# Patient Record
Sex: Female | Born: 1935 | Race: White | Hispanic: No | Marital: Married | Smoking: Never smoker
Health system: Southern US, Community
[De-identification: ages and names within clinical notes are randomized; demographics above are authoritative.]

## PROBLEM LIST (undated history)

## (undated) DIAGNOSIS — I10 Essential (primary) hypertension: Secondary | ICD-10-CM

## (undated) DIAGNOSIS — G8929 Other chronic pain: Secondary | ICD-10-CM

## (undated) DIAGNOSIS — I509 Heart failure, unspecified: Secondary | ICD-10-CM

## (undated) DIAGNOSIS — Z950 Presence of cardiac pacemaker: Secondary | ICD-10-CM

## (undated) DIAGNOSIS — J449 Chronic obstructive pulmonary disease, unspecified: Secondary | ICD-10-CM

## (undated) DIAGNOSIS — R0989 Other specified symptoms and signs involving the circulatory and respiratory systems: Secondary | ICD-10-CM

## (undated) DIAGNOSIS — E119 Type 2 diabetes mellitus without complications: Secondary | ICD-10-CM

## (undated) DIAGNOSIS — M549 Dorsalgia, unspecified: Secondary | ICD-10-CM

## (undated) DIAGNOSIS — C801 Malignant (primary) neoplasm, unspecified: Secondary | ICD-10-CM

## (undated) HISTORY — PX: BACK SURGERY: SHX140

## (undated) HISTORY — PX: LEG SURGERY: SHX1003

## (undated) HISTORY — PX: CORONARY STENT PLACEMENT: SHX1402

## (undated) HISTORY — PX: PACEMAKER PLACEMENT: SHX43

## (undated) HISTORY — PX: FOOT SURGERY: SHX648

## (undated) HISTORY — PX: ABDOMINAL HYSTERECTOMY: SHX81

## (undated) HISTORY — PX: TONSILLECTOMY: SUR1361

## (undated) HISTORY — DX: Other specified symptoms and signs involving the circulatory and respiratory systems: R09.89

---

## 2006-06-09 ENCOUNTER — Ambulatory Visit: Payer: Self-pay | Admitting: Podiatry

## 2006-06-13 ENCOUNTER — Ambulatory Visit: Payer: Self-pay | Admitting: Podiatry

## 2007-07-31 ENCOUNTER — Emergency Department: Payer: Self-pay | Admitting: Emergency Medicine

## 2007-07-31 ENCOUNTER — Other Ambulatory Visit: Payer: Self-pay

## 2008-05-27 ENCOUNTER — Ambulatory Visit: Payer: Self-pay | Admitting: Podiatry

## 2008-09-05 ENCOUNTER — Emergency Department: Payer: Self-pay | Admitting: Emergency Medicine

## 2009-02-22 ENCOUNTER — Emergency Department: Payer: Self-pay | Admitting: Emergency Medicine

## 2009-11-19 ENCOUNTER — Emergency Department: Payer: Self-pay | Admitting: Emergency Medicine

## 2010-02-22 ENCOUNTER — Ambulatory Visit: Payer: Self-pay | Admitting: Family Medicine

## 2010-04-12 ENCOUNTER — Encounter: Payer: Self-pay | Admitting: Internal Medicine

## 2010-04-15 ENCOUNTER — Emergency Department: Payer: Self-pay | Admitting: Emergency Medicine

## 2010-04-25 ENCOUNTER — Observation Stay: Payer: Self-pay | Admitting: Internal Medicine

## 2010-04-27 ENCOUNTER — Encounter: Payer: Self-pay | Admitting: Internal Medicine

## 2010-08-02 ENCOUNTER — Ambulatory Visit: Payer: Self-pay | Admitting: Podiatry

## 2010-11-16 ENCOUNTER — Ambulatory Visit: Payer: Self-pay | Admitting: Internal Medicine

## 2010-12-13 ENCOUNTER — Ambulatory Visit: Payer: Self-pay | Admitting: Internal Medicine

## 2011-06-05 ENCOUNTER — Inpatient Hospital Stay: Payer: Self-pay | Admitting: Podiatry

## 2011-06-05 ENCOUNTER — Other Ambulatory Visit: Payer: Self-pay | Admitting: Podiatry

## 2011-06-10 LAB — PATHOLOGY REPORT

## 2011-06-24 ENCOUNTER — Ambulatory Visit: Payer: Self-pay | Admitting: Vascular Surgery

## 2011-06-27 ENCOUNTER — Other Ambulatory Visit: Payer: Self-pay | Admitting: Podiatry

## 2011-07-01 LAB — MISC AER/ANAEROBIC CULT.

## 2011-07-11 ENCOUNTER — Inpatient Hospital Stay: Payer: Self-pay | Admitting: Podiatry

## 2011-07-11 LAB — BASIC METABOLIC PANEL
BUN: 27 mg/dL — ABNORMAL HIGH (ref 7–18)
Calcium, Total: 9.1 mg/dL (ref 8.5–10.1)
Creatinine: 0.91 mg/dL (ref 0.60–1.30)
EGFR (African American): >60
EGFR (Non-African Amer.): >60
Glucose: 204 mg/dL — ABNORMAL HIGH (ref 65–99)
Potassium: 4 mmol/L (ref 3.5–5.1)
Sodium: 143 mmol/L (ref 136–145)

## 2011-07-11 LAB — CBC WITH DIFFERENTIAL/PLATELET
Basophil #: 0.1 10*3/uL (ref 0.0–0.1)
Eosinophil %: 2.4 %
HCT: 35.1 % (ref 35.0–47.0)
HGB: 11.5 g/dL — ABNORMAL LOW (ref 12.0–16.0)
Lymphocyte #: 1.2 10*3/uL (ref 1.0–3.6)
MCH: 29.1 pg (ref 26.0–34.0)
MCV: 89 fL (ref 80–100)
Monocyte #: 0.7 10*3/uL (ref 0.0–0.7)
Platelet: 174 10*3/uL (ref 150–440)
RBC: 3.96 10*6/uL (ref 3.80–5.20)
RDW: 16.5 % — ABNORMAL HIGH (ref 11.5–14.5)

## 2011-07-13 LAB — BASIC METABOLIC PANEL
Anion Gap: 11 (ref 7–16)
Chloride: 102 mmol/L (ref 98–107)
Co2: 31 mmol/L (ref 21–32)
Creatinine: 1.06 mg/dL (ref 0.60–1.30)
EGFR (Non-African Amer.): 52 — ABNORMAL LOW
Glucose: 143 mg/dL — ABNORMAL HIGH (ref 65–99)
Osmolality: 294 (ref 275–301)
Sodium: 144 mmol/L (ref 136–145)

## 2011-07-13 LAB — CBC WITH DIFFERENTIAL/PLATELET
Basophil %: 0.7 %
Eosinophil %: 8.5 %
HGB: 11.6 g/dL — ABNORMAL LOW (ref 12.0–16.0)
MCH: 29.1 pg (ref 26.0–34.0)
Monocyte #: 0.7 10*3/uL (ref 0.0–0.7)
Monocyte %: 10.6 %
Neutrophil %: 65.6 %
Platelet: 165 10*3/uL (ref 150–440)
RBC: 3.97 10*6/uL (ref 3.80–5.20)
WBC: 6.7 10*3/uL (ref 3.6–11.0)

## 2011-07-15 LAB — CBC WITH DIFFERENTIAL/PLATELET
Basophil %: 0.9 %
Eosinophil %: 4.5 %
HCT: 28.5 % — ABNORMAL LOW (ref 35.0–47.0)
HGB: 9.4 g/dL — ABNORMAL LOW (ref 12.0–16.0)
Lymphocyte #: 1.4 10*3/uL (ref 1.0–3.6)
MCH: 29.1 pg (ref 26.0–34.0)
MCV: 88 fL (ref 80–100)
Monocyte #: 1 10*3/uL — ABNORMAL HIGH (ref 0.0–0.7)
Platelet: 137 10*3/uL — ABNORMAL LOW (ref 150–440)
RBC: 3.23 10*6/uL — ABNORMAL LOW (ref 3.80–5.20)
WBC: 8.1 10*3/uL (ref 3.6–11.0)

## 2011-07-15 LAB — CREATININE, SERUM
Creatinine: 1.08 mg/dL (ref 0.60–1.30)
EGFR (African American): >60

## 2011-07-16 LAB — BASIC METABOLIC PANEL
Anion Gap: 10 (ref 7–16)
BUN: 23 mg/dL — ABNORMAL HIGH (ref 7–18)
Calcium, Total: 8.2 mg/dL — ABNORMAL LOW (ref 8.5–10.1)
Chloride: 101 mmol/L (ref 98–107)
Osmolality: 287 (ref 275–301)
Potassium: 3.8 mmol/L (ref 3.5–5.1)

## 2011-07-16 LAB — CBC WITH DIFFERENTIAL/PLATELET
Basophil #: 0.1 10*3/uL (ref 0.0–0.1)
Basophil %: 1 %
Eosinophil #: 0.4 10*3/uL (ref 0.0–0.7)
HCT: 26.5 % — ABNORMAL LOW (ref 35.0–47.0)
HGB: 8.7 g/dL — ABNORMAL LOW (ref 12.0–16.0)
Lymphocyte #: 1.6 10*3/uL (ref 1.0–3.6)
Lymphocyte %: 22.5 %
MCH: 29 pg (ref 26.0–34.0)
MCHC: 32.9 g/dL (ref 32.0–36.0)
Monocyte #: 0.9 10*3/uL — ABNORMAL HIGH (ref 0.0–0.7)
Monocyte %: 12.4 %
Neutrophil #: 4.1 10*3/uL (ref 1.4–6.5)
RDW: 16.5 % — ABNORMAL HIGH (ref 11.5–14.5)
WBC: 7 10*3/uL (ref 3.6–11.0)

## 2011-08-07 ENCOUNTER — Ambulatory Visit: Payer: Self-pay | Admitting: Specialist

## 2011-08-07 LAB — CBC WITH DIFFERENTIAL/PLATELET
Basophil %: 1.3 %
Eosinophil %: 10.9 %
HCT: 31.3 % — ABNORMAL LOW (ref 35.0–47.0)
Lymphocyte #: 1 10*3/uL (ref 1.0–3.6)
MCH: 28.9 pg (ref 26.0–34.0)
MCV: 89 fL (ref 80–100)
Monocyte #: 0.6 10*3/uL (ref 0.0–0.7)
Monocyte %: 9.2 %
Neutrophil #: 3.8 10*3/uL (ref 1.4–6.5)
Platelet: 165 10*3/uL (ref 150–440)
RDW: 16.7 % — ABNORMAL HIGH (ref 11.5–14.5)
WBC: 6.2 10*3/uL (ref 3.6–11.0)

## 2011-08-07 LAB — BASIC METABOLIC PANEL
Anion Gap: 6 — ABNORMAL LOW (ref 7–16)
BUN: 24 mg/dL — ABNORMAL HIGH (ref 7–18)
Creatinine: 0.97 mg/dL (ref 0.60–1.30)
EGFR (Non-African Amer.): 58 — ABNORMAL LOW
Glucose: 223 mg/dL — ABNORMAL HIGH (ref 65–99)
Osmolality: 296 (ref 275–301)
Potassium: 4.2 mmol/L (ref 3.5–5.1)

## 2011-08-14 ENCOUNTER — Ambulatory Visit: Payer: Self-pay | Admitting: Specialist

## 2011-08-14 LAB — BASIC METABOLIC PANEL
Anion Gap: 7 (ref 7–16)
BUN: 20 mg/dL — ABNORMAL HIGH (ref 7–18)
Calcium, Total: 9.1 mg/dL (ref 8.5–10.1)
Co2: 32 mmol/L (ref 21–32)
EGFR (African American): >60
EGFR (Non-African Amer.): >60
Glucose: 134 mg/dL — ABNORMAL HIGH (ref 65–99)
Osmolality: 290 (ref 275–301)
Potassium: 4 mmol/L (ref 3.5–5.1)

## 2011-08-14 LAB — CBC WITH DIFFERENTIAL/PLATELET
Basophil #: 0.1 10*3/uL (ref 0.0–0.1)
Basophil %: 0.9 %
Eosinophil #: 0.6 10*3/uL (ref 0.0–0.7)
Eosinophil %: 9.1 %
HCT: 32.9 % — ABNORMAL LOW (ref 35.0–47.0)
Lymphocyte #: 1.3 10*3/uL (ref 1.0–3.6)
Lymphocyte %: 20.5 %
MCHC: 32.3 g/dL (ref 32.0–36.0)
MCV: 89 fL (ref 80–100)
Monocyte #: 0.6 10*3/uL (ref 0.0–0.7)
Neutrophil #: 3.8 10*3/uL (ref 1.4–6.5)
Neutrophil %: 60 %
RDW: 16.7 % — ABNORMAL HIGH (ref 11.5–14.5)
WBC: 6.4 10*3/uL (ref 3.6–11.0)

## 2011-08-14 LAB — SEDIMENTATION RATE: Erythrocyte Sed Rate: 33 mm/hr — ABNORMAL HIGH (ref 0–30)

## 2012-03-03 ENCOUNTER — Inpatient Hospital Stay: Payer: Self-pay | Admitting: Internal Medicine

## 2012-03-03 LAB — BASIC METABOLIC PANEL
BUN: 21 mg/dL — ABNORMAL HIGH (ref 7–18)
EGFR (African American): >60
EGFR (Non-African Amer.): >60
Glucose: 124 mg/dL — ABNORMAL HIGH (ref 65–99)
Osmolality: 286 (ref 275–301)
Potassium: 4.7 mmol/L (ref 3.5–5.1)
Sodium: 141 mmol/L (ref 136–145)

## 2012-03-03 LAB — HEPATIC FUNCTION PANEL A (ARMC)
Albumin: 3.7 g/dL (ref 3.4–5.0)
Bilirubin, Direct: 0.3 mg/dL — ABNORMAL HIGH (ref 0.00–0.20)
SGOT(AST): 38 U/L — ABNORMAL HIGH (ref 15–37)
Total Protein: 6.9 g/dL (ref 6.4–8.2)

## 2012-03-03 LAB — TROPONIN I: Troponin-I: <0.02 ng/mL

## 2012-03-03 LAB — CBC: HCT: 36.4 % (ref 35.0–47.0)

## 2012-03-03 LAB — PRO B NATRIURETIC PEPTIDE: B-Type Natriuretic Peptide: 7105 pg/mL — ABNORMAL HIGH (ref 0–450)

## 2012-03-03 LAB — CK TOTAL AND CKMB (NOT AT ARMC)
CK, Total: 73 U/L (ref 21–215)
CK, Total: 58 U/L (ref 21–215)
CK-MB: 1.6 ng/mL (ref 0.5–3.6)

## 2012-03-04 LAB — CBC WITH DIFFERENTIAL/PLATELET
Eosinophil #: 0.2 10*3/uL (ref 0.0–0.7)
Eosinophil %: 3.1 %
MCH: 28.5 pg (ref 26.0–34.0)
MCHC: 32.4 g/dL (ref 32.0–36.0)
Monocyte #: 0.7 x10 3/mm (ref 0.2–0.9)
Neutrophil #: 5 10*3/uL (ref 1.4–6.5)
Neutrophil %: 71.7 %
Platelet: 130 10*3/uL — ABNORMAL LOW (ref 150–440)
WBC: 7 10*3/uL (ref 3.6–11.0)

## 2012-03-04 LAB — BASIC METABOLIC PANEL
BUN: 22 mg/dL — ABNORMAL HIGH (ref 7–18)
Chloride: 106 mmol/L (ref 98–107)
Co2: 29 mmol/L (ref 21–32)
Creatinine: 0.92 mg/dL (ref 0.60–1.30)
EGFR (African American): >60
EGFR (Non-African Amer.): 57 — ABNORMAL LOW
Glucose: 110 mg/dL — ABNORMAL HIGH (ref 65–99)
Potassium: 3.9 mmol/L (ref 3.5–5.1)

## 2012-03-04 LAB — TROPONIN I: Troponin-I: 0.02 ng/mL

## 2012-03-04 LAB — LIPID PANEL
Cholesterol: 101 mg/dL (ref 0–200)
HDL Cholesterol: 23 mg/dL — ABNORMAL LOW (ref 40–60)
Ldl Cholesterol, Calc: 60 mg/dL (ref 0–100)
Triglycerides: 90 mg/dL (ref 0–200)

## 2012-03-04 LAB — MAGNESIUM: Magnesium: 2.3 mg/dL

## 2012-03-04 LAB — HEMOGLOBIN A1C: Hemoglobin A1C: 7.7 % — ABNORMAL HIGH (ref 4.2–6.3)

## 2012-03-04 LAB — CK TOTAL AND CKMB (NOT AT ARMC): CK, Total: 39 U/L (ref 21–215)

## 2012-03-05 LAB — CBC WITH DIFFERENTIAL/PLATELET
Basophil #: 0.1 x10 3/mm 3
Basophil %: 1 %
Eosinophil #: 0.2 x10 3/mm 3
Eosinophil %: 3.1 %
HCT: 35 %
HGB: 11.4 g/dL — ABNORMAL LOW
Lymphocyte %: 14.3 %
Lymphs Abs: 1 x10 3/mm 3
MCH: 28.7 pg
MCHC: 32.6 g/dL
MCV: 88 fL
Monocyte #: 0.7 "x10 3/mm "
Monocyte %: 10.3 %
Neutrophil #: 5 x10 3/mm 3
Neutrophil %: 71.3 %
Platelet: 146 x10 3/mm 3 — ABNORMAL LOW
RBC: 3.98 X10 6/mm 3
RDW: 17.2 % — ABNORMAL HIGH
WBC: 7 x10 3/mm 3

## 2012-03-05 LAB — BASIC METABOLIC PANEL
BUN: 21 mg/dL — ABNORMAL HIGH (ref 7–18)
Calcium, Total: 8.9 mg/dL (ref 8.5–10.1)
Chloride: 104 mmol/L (ref 98–107)
EGFR (African American): >60
Glucose: 130 mg/dL — ABNORMAL HIGH (ref 65–99)
Osmolality: 288 (ref 275–301)
Potassium: 3.8 mmol/L (ref 3.5–5.1)
Sodium: 142 mmol/L (ref 136–145)

## 2012-03-09 LAB — CULTURE, BLOOD (SINGLE)

## 2013-01-14 ENCOUNTER — Ambulatory Visit: Payer: Self-pay | Admitting: Internal Medicine

## 2013-04-19 ENCOUNTER — Emergency Department: Payer: Self-pay | Admitting: Emergency Medicine

## 2013-04-26 ENCOUNTER — Emergency Department: Payer: Self-pay | Admitting: Emergency Medicine

## 2013-04-26 LAB — BASIC METABOLIC PANEL
BUN: 20 mg/dL — ABNORMAL HIGH (ref 7–18)
Calcium, Total: 8.8 mg/dL (ref 8.5–10.1)
Co2: 32 mmol/L (ref 21–32)
Creatinine: 0.87 mg/dL (ref 0.60–1.30)
EGFR (Non-African Amer.): >60
Osmolality: 282 (ref 275–301)
Potassium: 4.4 mmol/L (ref 3.5–5.1)

## 2013-04-26 LAB — CBC
HCT: 33.3 % — ABNORMAL LOW (ref 35.0–47.0)
MCH: 29.5 pg (ref 26.0–34.0)
Platelet: 126 10*3/uL — ABNORMAL LOW (ref 150–440)
RBC: 3.74 10*6/uL — ABNORMAL LOW (ref 3.80–5.20)
RDW: 17.9 % — ABNORMAL HIGH (ref 11.5–14.5)
WBC: 5.6 10*3/uL (ref 3.6–11.0)

## 2013-08-19 ENCOUNTER — Observation Stay: Payer: Self-pay | Admitting: Internal Medicine

## 2013-08-19 LAB — CBC
HCT: 36.7 % (ref 35.0–47.0)
HGB: 12.2 g/dL (ref 12.0–16.0)
MCH: 30.6 pg (ref 26.0–34.0)
MCHC: 33.2 g/dL (ref 32.0–36.0)
MCV: 92 fL (ref 80–100)
Platelet: 190 10*3/uL (ref 150–440)
RBC: 3.98 10*6/uL (ref 3.80–5.20)
RDW: 15.3 % — ABNORMAL HIGH (ref 11.5–14.5)
WBC: 7.5 10*3/uL (ref 3.6–11.0)

## 2013-08-19 LAB — COMPREHENSIVE METABOLIC PANEL
ALK PHOS: 103 U/L
Albumin: 3.6 g/dL (ref 3.4–5.0)
Anion Gap: 5 — ABNORMAL LOW (ref 7–16)
BUN: 48 mg/dL — ABNORMAL HIGH (ref 7–18)
Bilirubin,Total: 0.6 mg/dL (ref 0.2–1.0)
CO2: 25 mmol/L (ref 21–32)
Calcium, Total: 8.4 mg/dL — ABNORMAL LOW (ref 8.5–10.1)
Chloride: 106 mmol/L (ref 98–107)
Creatinine: 1.07 mg/dL (ref 0.60–1.30)
EGFR (African American): 54 — ABNORMAL LOW
GFR CALC NON AF AMER: 47 — AB
GLUCOSE: 98 mg/dL (ref 65–99)
Osmolality: 285 (ref 275–301)
Potassium: 3.7 mmol/L (ref 3.5–5.1)
SGOT(AST): 22 U/L (ref 15–37)
SGPT (ALT): 18 U/L (ref 12–78)
Sodium: 136 mmol/L (ref 136–145)
TOTAL PROTEIN: 7.4 g/dL (ref 6.4–8.2)

## 2013-08-19 LAB — URINALYSIS, COMPLETE
BILIRUBIN, UR: NEGATIVE
BLOOD: NEGATIVE
Bacteria: NONE SEEN
Glucose,UR: NEGATIVE mg/dL (ref 0–75)
Hyaline Cast: 4
Ketone: NEGATIVE
Nitrite: NEGATIVE
PH: 5 (ref 4.5–8.0)
Protein: NEGATIVE
RBC,UR: 1 /HPF (ref 0–5)
Specific Gravity: 1.019 (ref 1.003–1.030)
Squamous Epithelial: 1
WBC UR: 18 /HPF (ref 0–5)

## 2013-08-19 LAB — TROPONIN I: Troponin-I: <0.02 ng/mL

## 2013-08-19 LAB — CLOSTRIDIUM DIFFICILE(ARMC)

## 2013-08-21 LAB — BASIC METABOLIC PANEL
ANION GAP: 5 — AB (ref 7–16)
BUN: 25 mg/dL — ABNORMAL HIGH (ref 7–18)
CHLORIDE: 111 mmol/L — AB (ref 98–107)
Calcium, Total: 8.6 mg/dL (ref 8.5–10.1)
Co2: 25 mmol/L (ref 21–32)
Creatinine: 0.9 mg/dL (ref 0.60–1.30)
EGFR (African American): >60
GFR CALC NON AF AMER: 58 — AB
GLUCOSE: 51 mg/dL — AB (ref 65–99)
OSMOLALITY: 283 (ref 275–301)
POTASSIUM: 3.9 mmol/L (ref 3.5–5.1)
SODIUM: 141 mmol/L (ref 136–145)

## 2013-08-21 LAB — CBC WITH DIFFERENTIAL/PLATELET
BASOS ABS: 0.1 10*3/uL (ref 0.0–0.1)
Basophil %: 0.9 %
EOS ABS: 0.1 10*3/uL (ref 0.0–0.7)
EOS PCT: 1.6 %
HCT: 32.8 % — AB (ref 35.0–47.0)
HGB: 10.8 g/dL — ABNORMAL LOW (ref 12.0–16.0)
Lymphocyte #: 0.6 10*3/uL — ABNORMAL LOW (ref 1.0–3.6)
Lymphocyte %: 8.3 %
MCH: 30.8 pg (ref 26.0–34.0)
MCHC: 32.9 g/dL (ref 32.0–36.0)
MCV: 93 fL (ref 80–100)
Monocyte #: 0.7 x10 3/mm (ref 0.2–0.9)
Monocyte %: 9.4 %
NEUTROS PCT: 79.8 %
Neutrophil #: 6.2 10*3/uL (ref 1.4–6.5)
Platelet: 131 10*3/uL — ABNORMAL LOW (ref 150–440)
RBC: 3.51 10*6/uL — AB (ref 3.80–5.20)
RDW: 15.3 % — ABNORMAL HIGH (ref 11.5–14.5)
WBC: 7.7 10*3/uL (ref 3.6–11.0)

## 2013-08-21 LAB — URINE CULTURE

## 2013-08-22 LAB — STOOL CULTURE

## 2013-08-23 LAB — BASIC METABOLIC PANEL
Anion Gap: 3 — ABNORMAL LOW (ref 7–16)
BUN: 18 mg/dL (ref 7–18)
Calcium, Total: 8.4 mg/dL — ABNORMAL LOW (ref 8.5–10.1)
Chloride: 114 mmol/L — ABNORMAL HIGH (ref 98–107)
Co2: 24 mmol/L (ref 21–32)
Creatinine: 0.83 mg/dL (ref 0.60–1.30)
EGFR (African American): >60
Glucose: 113 mg/dL — ABNORMAL HIGH (ref 65–99)
Osmolality: 284 (ref 275–301)
Potassium: 4.1 mmol/L (ref 3.5–5.1)
Sodium: 141 mmol/L (ref 136–145)

## 2013-08-23 LAB — CBC WITH DIFFERENTIAL/PLATELET
Basophil #: 0.1 10*3/uL (ref 0.0–0.1)
Basophil %: 1.7 %
Eosinophil #: 0.2 10*3/uL (ref 0.0–0.7)
Eosinophil %: 3.5 %
HCT: 31.8 % — AB (ref 35.0–47.0)
HGB: 10.5 g/dL — AB (ref 12.0–16.0)
LYMPHS PCT: 17.1 %
Lymphocyte #: 1.1 10*3/uL (ref 1.0–3.6)
MCH: 30.7 pg (ref 26.0–34.0)
MCHC: 33 g/dL (ref 32.0–36.0)
MCV: 93 fL (ref 80–100)
MONOS PCT: 12.1 %
Monocyte #: 0.8 x10 3/mm (ref 0.2–0.9)
Neutrophil #: 4.3 10*3/uL (ref 1.4–6.5)
Neutrophil %: 65.6 %
PLATELETS: 113 10*3/uL — AB (ref 150–440)
RBC: 3.42 10*6/uL — AB (ref 3.80–5.20)
RDW: 14.7 % — ABNORMAL HIGH (ref 11.5–14.5)
WBC: 6.5 10*3/uL (ref 3.6–11.0)

## 2013-08-23 LAB — URINE CULTURE

## 2013-10-23 ENCOUNTER — Emergency Department: Payer: Self-pay | Admitting: Emergency Medicine

## 2013-11-11 ENCOUNTER — Ambulatory Visit: Payer: Self-pay | Admitting: Podiatry

## 2014-10-06 ENCOUNTER — Ambulatory Visit
Admit: 2014-10-06 | Disposition: A | Discharge status: Home / Self Care | Payer: Self-pay | Attending: Internal Medicine | Admitting: Internal Medicine

## 2014-10-06 LAB — COMPREHENSIVE METABOLIC PANEL
ALT: 12 U/L — AB
ANION GAP: 7 (ref 7–16)
Albumin: 4 g/dL
Alkaline Phosphatase: 95 U/L
BILIRUBIN TOTAL: 0.8 mg/dL
BUN: 32 mg/dL — ABNORMAL HIGH
Calcium, Total: 9.1 mg/dL
Chloride: 102 mmol/L
Co2: 32 mmol/L
Creatinine: 1.16 mg/dL — ABNORMAL HIGH
EGFR (African American): 49 — ABNORMAL LOW
GFR CALC NON AF AMER: 42 — AB
GLUCOSE: 157 mg/dL — AB
POTASSIUM: 4.9 mmol/L
SGOT(AST): 17 U/L
Sodium: 141 mmol/L
TOTAL PROTEIN: 7.1 g/dL

## 2014-10-11 NOTE — Consult Note (Signed)
PATIENT NAME:  [name], [name] MR#:  [id number] DATE OF BIRTH:  1925/03/07  DATE OF CONSULTATION:  03/04/2012  REFERRING PHYSICIAN:   CONSULTING PHYSICIAN:  Isaias Cowman, MD  PRIMARY CARE PHYSICIAN: Dr. Ginette Pitman PRIMARY CARDIOLOGIST: Dr. Christie Nottingham at [place]: Shortness of breath.   REASON FOR CONSULTATION: Consultation requested for evaluation for congestive heart failure.   HISTORY OF PRESENT ILLNESS: The patient is an 81 year old female with known history of coronary artery disease, ischemic cardiomyopathy and apparent complete heart block status post pacemaker. Patient typically takes furosemide 80 mg alternating with 40 mg every day. This past weekend the patient did not take Lasix because she had two events that she wanted to attend. At both events the patient ate without regard to salt intake. The patient then developed shortness of breath with fluid retention and presented to [facility] Emergency Room with congestive heart failure. Patient was treated with intravenous furosemide with diuresis and clinical improvement. Patient feels that she is back to her baseline. She did not experience any chest pain. She has ruled out for myocardial infarction by CPK isoenzymes and troponin. Echocardiogram was performed which revealed LV ejection fraction of 35% to 40%, with moderate to severe tricuspid regurgitation and moderate mitral regurgitation with pacemaker lead noted in right the ventricle.   PAST MEDICAL HISTORY:  1. Coronary artery disease, status post coronary stent x2. 2. Complete heart block status post dual-chamber pacemaker with generator change out one year ago.  3. Hypertension.  4. Diabetes.  5. Ischemic cardiomyopathy.  6. Mild renal insufficiency.   MEDICATIONS:  1. Aspirin 81 mg daily.  2. Coreg 6.25 mg daily.  3. Plavix 75 mg daily.  4. Lasix 40 mg daily alternating with 80 mg every other day.  5. Imdur 15 mg daily.  6. Losartan 25 mg daily.   7. Fentanyl 25 mcg transdermal patch daily.  8. Glimepiride 4 mg b.i.d.  9. Januvia 100 mg daily.  10. Magnesium oxide 400 mg b.i.d.  11. Multivitamin 1 daily.  12. Oxycodone 10 mg 2 tablets q.a.m., 1 tablet at noon, 1 tablet at bedtime.  13. Protonix 40 mg daily.   SOCIAL HISTORY: Patient is married, lives with her husband. She denies tobacco abuse.   FAMILY HISTORY: No immediate family history of coronary disease or myocardial infarction.   REVIEW OF SYSTEMS: CONSTITUTIONAL: No fever or chills. EYES: No blurry vision. EARS: No hearing loss. RESPIRATORY: Shortness of breath as described above. CARDIOVASCULAR: No chest pain but pedal edema as described above. GASTROINTESTINAL: No nausea, vomiting, diarrhea, or constipation. GENITOURINARY: No dysuria, hematuria. ENDOCRINE: No polyuria, polydipsia. INTEGUMENTARY: No rash. MUSCULOSKELETAL: No arthralgias, myalgias. Patient does have chronic low back pain. NEUROLOGICAL: No focal muscle weakness or numbness. PSYCHOLOGICAL: No depression or anxiety.   PHYSICAL EXAMINATION:  VITAL SIGNS: Blood pressure 128/71, pulse 70, respirations 18, temperature 97.8, pulse oximetry 96%.   HEENT: Pupils equal, reactive to light and accommodation.   NECK: Supple without thyromegaly.   LUNGS: Clear.   CARDIOVASCULAR: Normal jugular venous pressure. Diffuse point of maximal impulse. Regular rate and rhythm. Normal S1, S2. Grade 1 to 2/6 systolic murmur.   ABDOMEN: Soft and nontender. Pulses were intact bilaterally.   MUSCULOSKELETAL: Normal muscle tone.   NEUROLOGIC: Patient is alert and oriented x3. Motor and sensory are both grossly intact.   IMPRESSION: 81 year old female with known coronary artery disease, ischemic cardiomyopathy status post dual chamber pacemaker who presents after dietary and medical noncompliance with fluid retention and congestive heart  failure that cleared rapidly after diuresis with intravenous furosemide. Patient has ruled out  for myocardial infarction by CPK isoenzymes and troponin. Echocardiogram reveals probable unchanged mild to moderate left ventricular dysfunction with LVEF of 35% to 40%.   RECOMMENDATIONS:  1. Agree with current overall therapy.  2. Would continue diuresis and monitor BUN and creatinine closely.  3. Would defer full dose anticoagulation.  4. Would defer further noninvasive or invasive cardiac evaluation at this time.  5. Per patient preference patient wishes to follow up with me for future outpatient cardiology management.   ____________________________ Isaias Cowman, MD ap:cms D: 03/04/2012 13:19:27 ET T: 03/04/2012 13:27:48 ET JOB#: [id number]  cc: Isaias Cowman, MD, <Dictator> Isaias Cowman MD ELECTRONICALLY SIGNED 03/15/2012 13:52

## 2014-10-11 NOTE — H&P (Signed)
PATIENT NAME:  [name], [name] MR#:  [id number] DATE OF BIRTH:  1924/11/16  DATE OF ADMISSION:  03/03/2012  ADMITTING PHYSICIAN: Dr. Gladstone Lighter  PRIMARY CARE PHYSICIAN: Dr. Ginette Pitman  PRIMARY CARDIOLOGIST: Dr. Christie Nottingham at [place]: Difficulty breathing.   HISTORY OF PRESENT ILLNESS: Ms. Ballowe is a very pleasant 81 year old Caucasian female with past medical history significant for diabetes mellitus, hypertension, coronary artery disease status post stents and low back pain secondary to spinal stenosis and back surgery presents to the hospital for gradually worsening dyspnea on exertion for the past one week. Patient's stents were about five years ago. She also has a pacemaker which was put seven years ago and that was renewed about a year ago. She has been doing fine but over the last month has noticed that she has been feeling more tired and also more short of breath easily. Over the past week the shortness of breath gradually worsened. She has chronic lower extremity edema which appears worse now. Because she had a lot of things to do over the weekend she started taking only half dose of Lasix at home instead of the full dose. She presents today with worsening shortness of breath and found to be in acute congestive heart failure exacerbation.   PAST MEDICAL HISTORY: 1. Hypertension. 2. Diabetes. 3. Coronary artery disease status post stent placement.  4. Sick sinus syndrome status post pacemaker.  5. Congestive heart failure. Patient is not aware of this diagnosis until now but present on  PCPs notes.  6. Mild renal insufficiency.  7. Spinal stenosis.   PAST SURGICAL HISTORY:  1. Pacemaker placement.  2. Bilateral cataract surgery.  3. Lower back surgery.  4. Bladder tack surgery.  5. Hysterectomy.  6. Left foot bunion surgery.  7. Appendectomy.  8. Bilateral ankle surgery after trauma.   ALLERGIES: Patient is intolerant to Cipro, Ceftin, codeine, morphine,  sulfa and also latex.   CURRENT MEDICATIONS AT HOME:  1. Aspirin 81 mg p.o. daily.  2. Coreg 6.25 mg p.o. daily.  3. Plavix 70 mg p.o. daily.  4. Fentanyl 25 mcg transdermal patch daily.  5. Lasix 40 mg every other day alternating with Lasix 80 mg every other day.  6. Glimepiride 4 mg p.o. b.i.d.  7. Imdur 15 mg p.o. daily. 8. Januvia 100 mg p.o. daily.  9. Losartan 25 mg p.o. daily.  10. Magnesium oxide 400 mg p.o. b.i.d.  11. Multivitamin 1 tablet p.o. daily.  12. Oxycodone 10 mg 2 tablets in the morning, 1 tablet at noon and 1 tablet at bedtime.  13. Protonix 40 mg p.o. daily.  14. Vitamin D2 50,000 international units once a week on Thursday.  15. Vitamin D3 400 international units p.o. daily.   SOCIAL HISTORY: Lives at home with her husband. Usually gets around with walker or a cane. Her balance issues are mostly after her spine surgery. No history of any smoking or alcohol use.   FAMILY HISTORY: Mom with congestive heart failure and stroke, dad with congestive heart failure and sister with diabetes mellitus.   REVIEW OF SYSTEMS: CONSTITUTIONAL: No fever or fatigue or weakness. EYES: Had cataract surgery. Vision is doing well now. No glaucoma or inflammation. ENT: No tinnitus or ear pain. Has hearing loss and wears hearing aids. No epistaxis or discharge. RESPIRATORY: No cough, wheeze, or hemoptysis, or chronic obstructive pulmonary disease. CARDIOVASCULAR: No chest pain. No orthopnea. Positive for PND and dyspnea on exertion. Presyncopal at times but no syncope.  GASTROINTESTINAL: No nausea, vomiting, diarrhea, abdominal pain, hematemesis, or melena. GENITOURINARY: No dysuria, hematuria, renal calculus, frequency, or incontinence. ENDOCRINE: No polyuria, nocturia, thyroid problems, heat or cold intolerance. HEMATOLOGY: No anemia, easy bruising or bleeding. SKIN: No acne, rash, or lesions. MUSCULOSKELETAL: No neck pain, shoulder pain or arthritis. Has lower back pain status post surgery.  NEUROLOGIC: No numbness, weakness, cerebrovascular accident, transient ischemic attack, or seizures. PSYCHOLOGICAL: No anxiety, insomnia, or depression.  PHYSICAL EXAMINATION:  VITAL SIGNS: Temperature 95.9 degrees Fahrenheit, pulse 76, respirations 20, blood pressure 173/81, pulse ox 93% on room air.   GENERAL: Well built, well nourished female lying in bed, not in any acute distress.   HEENT: Normocephalic, atraumatic. Pupils postsurgical, equal, round, reacting to light. Anicteric sclerae. Extraocular movements intact. Oropharynx is clear without erythema, mass or exudates.   NECK: Supple. No thyromegaly, JVD or carotid bruits. No lymphadenopathy.   LUNGS: Moving air bilaterally. She has diffuse crackles up to the lower half posteriorly more prominent. No wheeze. No use of accessory muscles for breathing.   CARDIOVASCULAR: S1, S2 regular rate and rhythm. 3/6 systolic murmur in precordial area and also pacemaker in place. No rubs or gallops.   ABDOMEN: Soft, nontender, nondistended. No hepatosplenomegaly. Normal bowel sounds.   EXTREMITIES: She has asymmetrical edema in both lower extremities which is chronic. Her left lower extremity appears larger than the right one which is also chronic. She has 2 to 3+ pitting edema of bilateral lower extremities. Her dorsalis pedis pulses are very feebly palpable.   NEUROLOGIC: Cranial nerves intact. No focal motor or sensory deficits.   SKIN: No acne, rash, or lesions.   LYMPHATICS: No cervical lymphadenopathy.   PSYCHOLOGICAL: Patient is awake, alert, oriented x3. Mood is normal.  LABORATORY, RADIOLOGICAL AND DIAGNOSTIC DATA: WBC 7.0, hemoglobin 11.7, hematocrit 36.4, platelet count 134, sodium 141, potassium 4.7, chloride 109, bicarbonate 27, BUN 21, creatinine 0.71, glucose 124, calcium 9.2. ALT 27, AST 38, alkaline phosphatase 109, total bilirubin 0.8, albumin 3.7. BNP is elevated at 7105. Chest x-ray showing increased interstitial markings  consistent with interstitial edema of cardiac cause. No focal pneumonia is seen.   Troponin 0.02 with CK 73, CK-MB 1.6. Ultrasound Doppler of bilateral lower extremities showing no evidence of thrombus within the left femoral or popliteal veins especially on the left. EKG showing paced rhythm with heart rate of 70.   ASSESSMENT AND PLAN: 81 year old female with history of diabetes, hypertension, coronary artery disease status post pacemaker comes in for worsening dyspnea on exertion and found to be in congestive heart failure. Her original cardiologist is Dr. Purcell Nails in [place] but patient says that she was planning anyway to change to Dr. Nehemiah Massed.  1. Acute congestive heart failure exacerbation with unknown ejection fraction. Last echo greater than one year ago so will admit to telemetry. Get the echo ordered and also cardiology consult by Dr. Nehemiah Massed. Start Lasix 40 IV b.i.d. and repeat chest x-ray in a.m. to ensure clearing of pulmonary markings. Continue Coreg and losartan.  2. Hypertension. Will continue her Coreg, losartan and Imdur at this time.   3. Diabetes mellitus. Follow up hemoglobin A1c. She is on glimepiride, Januvia and also sliding scale insulin.  4. Chronic left foot ulcer, healing now. She follows with Dr. Elvina Mattes. Currently not on any antibiotics.  5. Chronic lower back pain status post surgery on fentanyl patch and also oxycodone p.r.n.  6. GI and deep vein thrombosis prophylaxis. On Protonix and Lovenox. 7. CODE STATUS of the patient is FULL  CODE.   TIME SPENT ON ADMISSION: 50 minutes.   ____________________________ Gladstone Lighter, MD rk:cms D: 03/03/2012 16:19:19 ET T: 03/03/2012 16:46:09 ET JOB#: [id number] cc: Gladstone Lighter, MD, <Dictator> Tracie Harrier, MD Corey Skains, MD Gladstone Lighter MD ELECTRONICALLY SIGNED 03/03/2012 20:41

## 2014-10-11 NOTE — Consult Note (Signed)
Brief Consult Note: Diagnosis: CHF, likely secondary to medical and dietary noncompliance, resolved after diuresis, neg trop.   Patient was seen by consultant.   Consult note dictated.   Comments: REC  Agree with current therapy, cont diuresis, defer full dose anticoagulation, defer further cardiac diagnostics at this time, probable dc in am, may f/u with me for out-patient Cardiology care.  Electronic Signatures: Isaias Cowman (MD)  (Signed 11-Sep-13 13:21)  Authored: Brief Consult Note   Last Updated: 11-Sep-13 13:21 by Isaias Cowman (MD)

## 2014-10-11 NOTE — Discharge Summary (Signed)
PATIENT NAME:  [name], [name] MR#:  [id number] DATE OF BIRTH:  [birth year]  DATE OF ADMISSION:  03/03/2012 DATE OF DISCHARGE:  03/05/2012  DISCHARGE DIAGNOSES:  1. Acute congestive heart failure.  2. Hypertension.  3. Type 2 diabetes.  4. Chronic left foot ulcer.  5. Chronic low back pain.   CHIEF COMPLAINT: Shortness of breath.   HISTORY OF PRESENT ILLNESS: Ivannah Zody is an 81 year old female with a history of type 2 diabetes, hypertension, coronary artery disease, status post stent placement, history of chronic back pain and spinal stenosis, status post back surgery, presented to the hospital brought in by husband complaining of worsening shortness of breath for the past one week. The patient also noticed worsening lower extremity edema and reportedly had not been taking her Lasix like she is supposed to at home.   PAST MEDICAL HISTORY: Significant for: 1. Hypertension.  2. Type 2 diabetes. 3. Coronary artery disease, status post stent placement. 4. Sick sinus syndrome. 5. History of congestive heart failure. 6. Mild renal insufficiency. 7. Spinal stenosis.   PAST SURGICAL HISTORY: Significant for: 1. Pacemaker placement. 2. Bilateral cataract surgery.  3. Low back surgery.  4. Bladder tack surgery.  5. Hysterectomy.  6. Left foot bunion surgery.  7. Appendectomy.  8. Bilateral ankle surgery after trauma.    PHYSICAL EXAMINATION: VITAL SIGNS: Temperature 95.9, pulse 76, respirations 20, blood pressure 117/81, pulse oximetry 92% on room air. GENERAL: She was not in distress. HEENT: Normocephalic, atraumatic. Oropharynx clear. NECK: Supple. LUNGS: Diffuse bilateral crackles heard in both lung fields posteriorly. ABDOMEN: Soft, nontender. HEART: S1, S2, a 3/6 systolic ejection murmur. EXTREMITIES: 3+ pitting edema of both lower extremities. NEUROLOGICAL: Nonfocal.   LABORATORY, DIAGNOSTIC AND RADIOLOGICAL DATA: WBC count 7, hemoglobin 11.7, hematocrit 36.4, platelets 134. Sodium 141,  potassium 4.7, chloride 109, bicarbonate 27, BUN 21, creatinine 0.71, glucose 124. BNP was 7105. Chest x-ray showed increased interstitial markings. No focal infiltrates are seen. Troponin 0.02, CK 73, MB 1.6. Ultrasound of both lower extremities negative for deep venous thrombosis. EKG showed paced rhythm with a heart rate of 70. The patient also underwent an echocardiogram while she was in the hospital that showed ejection fraction of 35% to 40% with moderately severe TR and moderately severe MR.   HOSPITAL COURSE: The patient was admitted to [facility] and received intravenous Lasix. She was also seen in consultation with Dr. Saralyn Pilar, cardiologist, who agreed with plan of care. Her leg edema significantly improved. Shortness of breath also improved. She was symptomatically much better. She was ambulated and maintained good oxygen saturations and was stable at the time of discharge.   DISCHARGE MEDICATIONS: The patient was discharged home on the following medications.  1. Carvedilol 6.25 mg once a day.  2. Furosemide 80 mg every other day alternating with 40 mg.  3. Glimepiride 4 mg b.i.d.  4. Isosorbide 30 mg 1/2 once a day.  5. Januvia 100 mg once a day.  6. Losartan 25 mg once a day.  7. Mag-Ox 400 mg b.i.d.  8. Oxycodone 10 mg b.i.d. p.r.n.  9. Fentanyl patch 25 mcg, 1 patch every 72 hours.  10. Vitamin D2 50,000 units once a week.  11. Pantoprazole 40 mg once a day.  12. Plavix 75 mg once a day.   The patient was stable at the time of discharge and advised to follow up with me, Dr. Ginette Pitman, in 1 to 2 weeks and also follow up with Dr. Saralyn Pilar, cardiologist, in 1 to 2  weeks' time.  Total time spent in discharging patient was 30 minutes  ____________________________ Tracie Harrier, MD vh:cbb D: 03/06/2012 08:00:26 ET T: 03/06/2012 15:23:31 ET JOB#: [id number]  cc: Tracie Harrier, MD, <Dictator> Tracie Harrier MD ELECTRONICALLY SIGNED 03/16/2012 13:05

## 2014-10-15 NOTE — H&P (Signed)
PATIENT NAME:  [name], [name] MR#:  [id number] DATE OF BIRTH:  Aug 28, 1924  DATE OF ADMISSION:  08/19/2013  REFERRING PHYSICIAN:  Conni Slipper, MD  PRIMARY CARE PHYSICIAN: Tracie Harrier, MD, of [facility]   CHIEF COMPLAINT: Diarrhea.   HISTORY OF PRESENT ILLNESS: An 81 year old Caucasian female with a past medical history of congestive heart failure, ejection fraction 35%, atrial fibrillation status post permanent pacemaker placement, hypertension, coronary artery disease, who is presenting with diarrhea. She describes a 4-day duration of diarrhea of multiple bouts daily, noting at least 9 bouts this morning. No blood or mucus. No abdominal pain. Has minimal nausea. Had one bout of emesis,  which is also non-bloody, non-bilious. No fevers or chills. She denies any recent sick contacts or recent antibiotic usage. In the Emergency Department, after an episode of diarrhea, she had presyncopal episode described as lightheadedness. No fall or loss of consciousness with continuing symptoms and now presyncopal. She is being admitted to the hospital. Currently, she is complaining only of generalized fatigue and weakness.   REVIEW OF SYSTEMS:    CONSTITUTIONAL: Positive fatigue, weakness. Denies fevers or chills.  EYES: Blurred vision, double vision, eye pain.  EARS, NOSE, THROAT: Denies tinnitus, ear pain, hearing loss.  RESPIRATORY: No cough, shortness of breath.  CARDIOVASCULAR: Denies chest pain, palpitations, edema.  GASTROINTESTINAL: Positive for nausea, vomiting, diarrhea as described above. Denies any abdominal pain, hematemesis, melena or bright red blood per rectum.  GENITOURINARY: Denies dysuria, hematuria.  ENDOCRINE: Denies nocturia or thyroid function studies bruising, bleeding.  SKIN: Denies rash or lesion.  MUSCULOSKELETAL: Denies pain in neck, back, shoulder, knees, hips or arthritic symptoms.  NEUROLOGIC: Denies paralysis, paresthesias.  PSYCHIATRIC: Denies anxiety or depressive  symptoms. Otherwise, full review of systems negative.  PAST MEDICAL HISTORY: Congestive heart failure, ejection fraction 35%, atrial fibrillation,  sick sinus syndrome status post permanent pacemaker insertion, hypertension, coronary artery disease.   SOCIAL HISTORY: Denies any alcohol, tobacco or drug usage.   FAMILY HISTORY: Positive for congestive heart failure and CVA in her mother as well as diabetes in a sister.   ALLERGIES: CEFTIN CODEINE, EPINEPHRINE, FLORAQUIN, (DICTATION ANOMALY) ANTIBIOTICS, MORPHINE, NOVOCAIN, SULFA DRUGS AND TOPROL-XL AS WELL AS TORADOL.   HOME MEDICATIONS: Include: 1. Fentanyl patch 25 mcg/h every 72 hours. 2. Oxycodone 10 mg 2 tabs in the morning, 1 tablet at noon and 1 tablet at bedtime. 3. Losartan 25 mg p.o. daily. 4. Imdur 15 mg p.o. daily. 5. Glimepiride 4 mg p.o. b.i.d. 6. Januvia 100 mg p.o. daily. 7. Plavix 75 mg p.o. daily. 8. Coreg 6.25 mg p.o. daily, 9. Lasix 40 mg alternating with 80 mg every other day. 10. Magnesium oxide 400 mg 2 tablets p.o. daily. 11. Pantoprazole 40 mg p.o. daily. 12. Multivitamin 1 tablet p.o. daily. 13. Vitamin D2 50,000 international units once weekly. 14. Vitamin D3 400 international units p.o. daily.   PHYSICAL EXAMINATION:  VITAL SIGNS: Temperature 97.8, heart rate 71, respirations 18, blood pressure 136/60, saturating 86% on room air, currently saturating 98% on supplemental O2. Weight 81.6 kg, BMI 28.2.  GENERAL: Weak, frail-appearing, Caucasian female, currently in no acute distress.  HEAD: Normocephalic, atraumatic.  EYES: Pupils equal, round, reactive to light. Extraocular muscles intact. No scleral icterus.  MOUTH: Dry mucosal membranes. Dentition intact. No abscess noted.  EARS, NOSE, THROAT: Throat clear without exudates. No external lesions.   NECK: Supple. No thyromegaly. No nodules. No JVD.  PULMONARY: Clear to auscultation bilaterally without wheezes, rales or rhonchi. No use of  accessory muscles.  Good respiratory effort. CHEST: Nontender to palpation.  CARDIOVASCULAR: S1, S2, regular rate and rhythm. No murmurs, rubs, or gallops. No edema. Pedal pulses 2+ bilaterally.  GASTROINTESTINAL: Soft, nontender, nondistended. No masses. Positive bowel sounds. No hepatosplenomegaly.  MUSCULOSKELETAL: No swelling, clubbing, edema. Range of motion full in all extremities.  NEUROLOGIC: Cranial nerves II through XII intact. No gross focal neurological deficit. Sensation intact. Reflexes intact.  SKIN: No ulcerations, lesions, rashes, cyanosis. Skin warm, dry. Turgor poor.  PSYCHIATRIC: Mood and affect within normal limits. The patient is awake, alert and oriented x3. Insight and judgment intact.   LABORATORY DATA: Sodium 136, potassium 3.7, chloride 106, bicarbonate 25, BUN 48, creatinine 1.07 with apparent baseline around 0.8, glucose 98. LFTs within normal limits. WBC 7.5, hemoglobin 12.2, platelets 190. Urinalysis: WBCs 18, RBCs 1, leukocyte esterase 3, epithelial cells 1, nitrite negative. Chest x-ray performed revealing cardiomegaly. No acute cardiopulmonary process.   ASSESSMENT AND PLAN: An 81 year old Caucasian female with history of congestive heart failure, ejection fraction 35%, presenting with diarrhea. Had presyncopal episode in the Emergency Department after a large bowel movement. 1. Diarrhea. Clostridium difficile is pending at this time. We will check fecal WBCs. If both of these are normal, she can start Imodium for symptom improvement. Provide intravenous fluid hydration with normal saline. We will check orthostatic vital signs as well.  2. Acute kidney injury, likely prerenal in nature given history. IV fluid hydration. We will follow urine output and renal function. 3. Congestive heart failure, ejection fraction 35%. Hold diuretics currently. Continue Coreg, Imdur and losartan as blood pressure allows.  4. Deep venous thrombosis prophylaxis with heparin subcu.  5. The patient is FULL  CODE.   TIME SPENT: 45 minutes   ____________________________ Aaron Mose. Shylo Zamor, MD dkh:sw D: 08/19/2013 22:58:55 ET T: 08/20/2013 01:43:55 ET JOB#: [id number]  cc: Aaron Mose. Sophira Rumler, MD, <Dictator> Conni Slipper, MD [place] MD ELECTRONICALLY SIGNED 08/20/2013 3:19

## 2014-10-15 NOTE — Discharge Summary (Signed)
PATIENT NAME:  [name], [name] MR#:  [id number] DATE OF BIRTH:  [birth year]  DATE OF ADMISSION:  08/19/2013 DATE OF DISCHARGE:  08/23/2013  DIAGNOSES AT TIME OF DISCHARGE: 1.  Diarrhea with weakness and dehydration.  2.  Urinary tract infection with Escherichia coli.  3.  History of congestive heart failure.  4.  History of hypertension and atrial fibrillation.   CHIEF COMPLAINT: Diarrhea.   HISTORY OF PRESENT ILLNESS: Kiriana Worthington is an 81 year old female with a past medical history significant for CHF, ejection fraction 35%, history of Afib, status post permanent pacemaker, hypertension, CAD, who presents to the ED with a  4 day history of diarrhea with multiple bouts daily. Denies any passage of blood or mucus. No abdominal pain, but has been complaining of generalized sense of weakness and also had a near syncopal event with lightheadedness. The patient denies any loss of consciousness, but has been complaining of a generalized sense of fatigue.   PAST MEDICAL HISTORY: Significant for CHF, Afib, sick sinus syndrome, pacemaker placement, hypertension, CAD, chronic pain syndrome with degenerative osteoarthritis and back pain.   PHYSICAL EXAMINATION: VITAL SIGNS: Temperature was 97.8. Heart rate was 71, respirations 18, blood pressure 136/80.  GENERAL: She appeared weak and frail.  HEENT: NCAT.  HEART: S1, S2.  LUNGS: Clear to auscultation.  ABDOMEN: Soft, nontender.  EXTREMITIES: No edema.  NEUROLOGIC: Nonfocal.   LABORATORY DATA: Sodium 136, potassium 3.7, chloride 106, BUN 48, bicarb 25, creatinine 1.07. LFTs were normal. WBC count 7.5, hemoglobin 12.2, platelets 190. Urinalysis showed WBCs of 18. Urine cultures grew  E. coli that was sensitive to Macrodantin.   The patient was admitted to [facility] and received intravenous fluids. She was also started on Macrodantin for UTI and for prerenal azotemia. IV fluids appeared to help in her renal function and BUN also improved. She was seen by  physical therapy. Diuretics were initially held, and as her hydration level improved, this was restarted. The patient was discharged in stable condition on March 2nd on the following medications: Oxycodone 10 mg 2 tabs in the morning 1 tab at noon and 1 tab at bedtime, Fentanyl patch 25 mcg, 1 patch every 72 hours; vitamin D2, 50,000 units once a week; clopidogrel 75 mg once a day, pantoprazole 40 mg once a day, magnesium oxide 400 mg b.i.d., losartan 25 mg once a day, Januvia 100 mg once a day, isosorbide mononitrate 30 mg 1/2 tablet once a day, glimepiride 4 mg b.i.d., furosemide 40 mg alternating with 80 mg every other day, zolpidem extended release 5 mg p.r.n., methocarbamol 750 mg t.i.d. as needed, carvedilol 6.25 mg p.o. b.i.d.   The patient has been advised to drink plenty of fluids and follow up with me, Dr. Ginette Pitman, in 1 to 2 weeks' time. Has been advised to call back to the clinic if there are any questions or concerns.   Total time spent on discharging this patient was 35 minutes.     ____________________________ Tracie Harrier, MD vh:dmm D: 09/01/2013 13:02:42 ET T: 09/01/2013 13:12:49 ET JOB#: [id number]  cc: Tracie Harrier, MD, <Dictator> Tracie Harrier MD ELECTRONICALLY SIGNED 09/29/2013 13:38

## 2014-10-16 NOTE — Op Note (Signed)
PATIENT NAME:  [name], [name] MR#:  [id number] DATE OF BIRTH:  [birth year]  DATE OF PROCEDURE:  06/24/2011  PREOPERATIVE DIAGNOSES:  1. Peripheral arterial disease with ulceration, left lower extremity.  2. Diabetes.  3. Atrial fibrillation.  4. Hypertension.   POSTOPERATIVE DIAGNOSES:  1. Peripheral arterial disease with ulceration, left lower extremity.  2. Diabetes.  3. Atrial fibrillation.  4. Hypertension.   PROCEDURES:  1. Catheter placement into left posterior tibial artery from right femoral approach.  2. Aortogram and selective left lower extremity angiogram.  3. Percutaneous transluminal angioplasty of left popliteal artery with 5 mm diameter angioplasty balloon.  4. Percutaneous transluminal angioplasty of left posterior tibial artery with 3 mm diameter angioplasty balloon.  5. StarClose closure device, right femoral artery.   SURGEON: Algernon Huxley, M.D.   ANESTHESIA: Local with moderate conscious sedation.   ESTIMATED BLOOD LOSS: Approximately 50 mL.  CONTRAST USED: 60 mL Visipaque.   FLUOROSCOPY TIME: Approximately five minutes.   INDICATION FOR PROCEDURE: This is an 81 year old white female with an open wound of the left foot. She has had debridement of this area with podiatry and the wound persists. She was found to have vascular disease of the left lower extremity last week on noninvasive studies. She is brought in today for revascularization. Risks and benefits were discussed and informed consent was obtained.   DESCRIPTION OF PROCEDURE: The patient is brought to the vascular interventional radiology suite. Groins were shaved and prepped and a sterile surgical field was created. The right femoral head was localized with fluoroscopy and the right femoral artery was accessed with minimal difficulty with a Seldinger needle. A three J-wire was placed. A 5 French sheath was placed over the wire and the wire and dilator were removed. I then placed a pigtail catheter in the  aorta at the L1 level and AP aortogram was performed. This showed a mild left renal artery stenosis of less than 60%, a patent right renal artery, and no significant aortoiliac stenosis. I hooked the aortic bifurcation and advanced to the left femoral head and selective left lower extremity angiogram was then performed. This showed a patent common femoral, profunda femoris, and superficial femoral artery. The above-knee popliteal artery had a short segment total or near total occlusion and distally she had peroneal runoff with a long segment posterior tibial artery occlusion which did reconstitute distally. The patient was given 3500 units of intravenous heparin for systemic anticoagulation. I placed a 6 [place] Ansel sheath over a Terumo Advantage wire. I was able to cross the popliteal lesion without difficulty and confirm intraluminal flow in the tibioperoneal trunk and then elected to gain access to the posterior tibial artery and attempted to cross the occlusion. I was able to cross this occlusion without difficulty with a Terumo Advantage wire and a 135 angled catheter confirming intraluminal flow in the distal posterior tibial artery just above the ankle. I then replaced an 0.018 wire. A 3 mm diameter angioplasty balloon was used to treat the long segment posterior tibial artery occlusion, and a 5 mm diameter angioplasty balloon was used to treat the popliteal artery lesion. Completion angiogram showed the popliteal lesion be widely patent without significant residual stenosis and the long segment posterior tibial occlusion was also widely patent without significant stenosis and now there was two-vessel runoff to the foot. At this point, I was pleased with our result and elected to terminate the procedure. The sheath was pulled back to the ipsilateral external iliac artery and  oblique arteriogram was performed. StarClose closure device was deployed in the usual fashion with excellent hemostatic result. The  patient tolerated the procedure well and was taken to the recovery room in stable condition.  ____________________________ Algernon Huxley, MD jsd:slb D: 06/24/2011 09:41:50 ET     T: 06/24/2011 10:59:33 ET        JOB#: [id number] cc: Gerrit Heck Troxler, DPM Tracie Harrier, MD Algernon Huxley MD ELECTRONICALLY SIGNED 07/12/2011 7:56

## 2014-10-16 NOTE — Op Note (Signed)
PATIENT NAME:  [name], [name] MR#:  [id number] DATE OF BIRTH:  1925/06/07  DATE OF PROCEDURE:  07/15/2011  PREOPERATIVE DIAGNOSES:  1. Diabetic foot infection, left foot.  2. Patient requires intravenous antibiotics for greater than five days.  POSTOPERATIVE DIAGNOSES:  1. Diabetic foot infection, left foot.  2. Patient requires intravenous antibiotics for greater than five days.  PROCEDURE PERFORMED: Insertion of right basilic single-lumen Morpheus PICC line.   SURGEON:  Hortencia Pilar, MD  DESCRIPTION OF PROCEDURE: The patient is brought to Special Procedures and placed in the supine position with her right arm extended palm upward. Right arm is prepped and draped in a sterile fashion. Ultrasound is placed in a sterile sleeve. Ultrasound is utilized secondary to lack of appropriate landmarks and to avoid vascular injury. Under direct ultrasound visualization, the basilic vein is identified. It is echolucent, homogeneous, and easily compressible. This indicates patency. 1% lidocaine is infiltrated into the soft tissues and a micropuncture needle is inserted into the basilic vein. Microwire is then advanced and under fluoroscopy the wire is positioned with its tip at the atriocaval junction. Measurements are made and dilator and peel-away sheath are then inserted over the wire. Dilator is removed. A Morpheus single-lumen PICC line is trimmed to 43 cm, and then advanced over the wire. Wire and peel-away sheath are removed. Catheter tip is evaluated under fluoroscopy and is noted to be in the proximal superior vena cava. It aspirates and flushes well and is packed with heparinized saline. It is secured to the arm with a StatLock and a sterile dressing. The patient tolerated the procedure well and there were no complications.   ____________________________ Katha Cabal, MD ggs:bjt D: 07/17/2011 10:40:21 ET T: 07/17/2011 11:01:48 ET JOB#: [id number]  Dolores Lory Ericah Scotto MD ELECTRONICALLY SIGNED  08/07/2011 11:09

## 2014-10-16 NOTE — Op Note (Signed)
PATIENT NAME:  [name], [name] MR#:  [id number] DATE OF BIRTH:  23-Jul-1924  DATE OF PROCEDURE:  07/13/2011  PREOPERATIVE DIAGNOSIS: Osteomyelitis first MTPJ left foot.   POSTOPERATIVE DIAGNOSIS: Osteomyelitis first MTPJ left foot.   PROCEDURES: Removal of some of the first metatarsal head left foot and the base of proximal phalanx left foot along with the fibular sesamoid as well as debridement of soft tissue infection and removal of soft tissue infection.   SURGEON: Gerrit Heck. Nikko Quast, DPM  ASSISTANT: None.   HISTORY OF PRESENT ILLNESS: Patient had an infection in her foot. A month ago we had will take out the tibial sesamoid. She failed to heal up from it and continued to get stress and irritation to the point where she developed lack of healing of the wound. She had an angioplasty done to help improve her circulation, has good color to her toes but still a wound that progresses down to the metatarsal head. This is a situation where she is getting too much stress on the area, the wound has opened and has been open for about a week or so and likely has osteomyelitis in the region.   ANESTHESIA: Local with MAC.   ESTIMATED BLOOD LOSS: About 75 mL.   TOURNIQUET: Ankle tourniquet 250 mmHg pressure for part of the procedure.   DESCRIPTION OF PROCEDURE: Patient brought to the Operating Room and placed on the Operating Room table in supine position. At this point after MAC and local anesthesia was achieved, patient was prepped and draped in the usual sterile manner. Attention was then directed to the plantar aspect of the left first MTP joint where an ulcer was noted. An incision was made proximal and distal to that approximately 1 cm each direction, deepened with sharp and blunt dissection. Head of the metatarsal was identified and a portion of this was resected with rongeurs and sent for culture. Some involvement of the flexor tendons was noted with infectious appearance to the area and some  devitalization of the tendon structures. This was debrided and removed. The base of the proximal phalanx was then freed from its soft tissue and resected utilizing power equipment and the area was copiously irrigated. The plantar aspect of the metatarsal head was also resected and appeared to be clean bone at both the proximal phalanx and the metatarsal head. The fibular sesamoid was also resected and removed. Following copious irrigation and debridement with the Versajet the toe was seen to sit in noted extended position so dorsally I performed an extensor hallux longus tendon lengthening through a 1 cm incision. This was proximal to the joint. This was copiously irrigated and the tendon was sutured back with 4-0 Vicryl simple interrupted sutures and tendon sheath was as well. Skin closed with 5-0 nylon simple interrupted sutures and as noted copious irrigation was achieved. The toe was seen to sit in a much better position at this point following this procedure. After copious irrigation plantarly vancomycin beads were formed. Thee were six beads placed in the wound the deep and superficial fascial layers were then closed over the area with 3-0 Vicryl simple interrupted sutures. Skin was closed with 3-0 nylon simple interrupted sutures as well. There was an irregularity to the wound medially. This was cleaned up and a wedge was taken out of this region and allowed primary closure of the wound in all facets. Beads will be removed in about 10 to 12 days. Patient appeared to tolerate procedure and anesthesia well. Sterile compressive dressing was  placed across the area consisting of Xeroform gauze, 4 x 4's, ABD pads, Kling, and Kerlix, and an Ace wrap. Patient tolerated procedure and anesthesia well. Left the Operating Room for recovery room vital signs stable and neurovascular status intact. We will keep her nonweightbearing on this for the time being.   ____________________________ Gerrit Heck. Arlynn Mcdermid,  DPM mgt:cms D: 07/13/2011 10:30:25 ET T: 07/13/2011 10:58:53 ET JOB#: [id number]  cc: Gerrit Heck Zetta Stoneman, DPM, <Dictator> Perry Mount MD ELECTRONICALLY SIGNED 08/08/2011 11:29

## 2014-10-16 NOTE — Consult Note (Signed)
PATIENT NAME:  [name], [name] MR#:  [id number] DATE OF BIRTH:  1925-04-08  DATE OF CONSULTATION:  07/12/2011  REFERRING PHYSICIAN:  Dr Elvina Mattes CONSULTING PHYSICIAN:  Heinz Knuckles. Juanda Luba, MD  REASON FOR CONSULTATION: Possible osteomyelitis.   HISTORY OF PRESENT ILLNESS: The patient is an 81 year old white female with a past history significant for diabetes and recent methicillin sensitive Staphylococcus aureus left foot infection who was admitted for possible osteomyelitis. The patient had been admitted to the hospital in December from June 05, 2011 through June 08, 2011. At that time I had seen her in consultation, and she had had a wound culture that grew methicillin sensitive Staphylococcus aureus. She was discharged on doxycycline for 2 weeks. She was readmitted on June 24, 2011 and had angioplasty on the left. She apparently had had her antibiotics extended because she failed to heal and continued to have drainage and redness from the site. I had not seen her since her last hospitalization. She states that she has not had any fevers, chills, or sweats. She has continued the antibiotic but it has not been significantly helping. She is scheduled to go to the OR tomorrow. She was started on vancomycin and Zosyn on admission.   ALLERGIES: Ceftin although she has tolerated penicillins in the past, codeine, epinephrine, morphine, Novocain, sulfa, Toprol, and Toradol.   PAST MEDICAL HISTORY:  1. Diabetes.  2. Coronary artery disease, status post stent placement.  3. Peripheral vascular disease, status post angioplasty.  4. Status post pacemaker.  5. Hypertension.  6. Recent methicillin sensitive Staphylococcus aureus left foot infection.   SOCIAL HISTORY: The patient lives at home with her husband. She does not smoke. She does not drink. She has a dog at home.   FAMILY HISTORY: Positive for prostate cancer, coronary artery disease, congestive heart failure, stroke and diabetes.   REVIEW  OF SYSTEMS: GENERAL: No fevers, chills, or sweats. No significant malaise.  HEENT: No headaches or sinus congestion. No sore throat. NECK: No stiffness. No swollen glands. RESPIRATORY: No cough, no shortness of breath, no sputum production. CARDIAC: No chest pains. No palpitations. GI: No nausea, no vomiting, no abdominal pain. No change in her bowels. She has had anorexia during her time since discharge. GENITOURINARY: No change in her urine. MUSCULOSKELETAL: She has had swelling, redness, and some discomfort in the left foot. There has been drainage from the wound as well. She has had no lymphangitic streaking. SKIN: No rashes other than the left foot. NEUROLOGIC: No focal weakness. No confusion. PSYCHIATRIC: No complaints. All other systems are negative.   PHYSICAL EXAMINATION:  VITAL SIGNS: T-max 99.3, pulse of 75, blood pressure 120/65, 93% on room air.   GENERAL: An 81 year old white female in no acute distress.   HEENT: Normocephalic, atraumatic. Pupils equal and reactive to light. Extraocular motion intact. Sclerae, conjunctivae, and lids are without evidence for emboli or petechiae. Oropharynx shows no erythema or exudate. Teeth and gums are in fair condition.   NECK: Supple. Full range of motion. Midline trachea. No lymphadenopathy. No thyromegaly.   LUNGS: Clear to auscultation bilaterally. Good air movement. No focal consolidation.   CARDIAC: Regular rate and rhythm without murmur, rub, or gallop.   ABDOMEN: Soft, nontender, nondistended. No hepatosplenomegaly. No hernias noted.   EXTREMITIES: Her left foot is wrapped in bandages and the wound was not directly observed. She had no lymphangitic streaking and no significant swelling was noted. There was no evidence for endocarditis, specifically no Janeway lesions or Osler nodes.  NEUROLOGIC: The patient was awake and interactive, moving all four extremities.   PSYCHIATRIC: Mood and affect appeared normal.   LABORATORY DATA:  BUN  was 27, creatinine was 0.91, bicarbonate 33, anion gap of 9. White count of 9.6 with a hemoglobin 11.5, platelet count of 174,000, ANC of 7.4. Sedimentation rate of 35. The patient states a culture was obtained, but there is no result yet available. A plain film of the x-ray shows no cortical destruction, no periosteal reaction. There was generalized osteopenia. There was a skin defect along the plantar aspect of the first MTP joint extending down to the level of the first metatarsal head. A bone scan was obtained which showed increased activity in all three phases involving the first MTP joint and the first proximal phalanx concerning for infection.   IMPRESSION: An 81 year old white female with a past history significant for diabetes and methicillin sensitive Staphylococcus aureus recent diabetic foot infection admitted with possible osteomyelitis of the left foot.   RECOMMENDATIONS:  1. Her prior cultures grew methicillin sensitive Staphylococcus aureus. She did not respond to oral dicloxacillin.  2. She is to go for incision and drainage tomorrow. We will await the findings and cultures.  3. Would continue vancomycin and Zosyn for now.  4. If she does have osteomyelitis, she will likely need 6 weeks of IV therapy.   This is a moderately complex infectious disease case. Thank you very much for involving me in Ms. Weyandt's care.     ____________________________ Heinz Knuckles. Akaisha Truman, MD meb:vtd D: 07/12/2011 16:44:44 ET T: 07/13/2011 08:51:56 ET JOB#: [id number]  cc: Heinz Knuckles. Woodruff Skirvin, MD, <Dictator> Jalaila Caradonna E Courtnei Ruddell MD ELECTRONICALLY SIGNED 07/17/2011 15:31

## 2014-10-16 NOTE — H&P (Signed)
PATIENT NAME:  [name], MARKSBERRY MR#:  [id number] DATE OF BIRTH:  28-Aug-1924  DATE OF ADMISSION:  07/11/2011  REASON FOR ADMISSION: Osteomyelitis, left foot.   HISTORY OF PRESENT ILLNESS: The patient has had a diabetic ulceration in the submet one area back a month ago, had a surgical I and D to that region and removed the tibial sesamoid. Was able to repair the capsule at that time. The patient failed to heal and failed to improve. Had an angioplasty done last week which seemed to improve her blood supply but still has exposed bone in that region with a lack of healing to the region despite using a Wound VAC for the last week. We felt like because she had exposed bone there is a risk of osteomyelitis and we needed to go ahead and get her admitted to the hospital on IV antibiotics and possible surgical resolution to the problem.   PAST MEDICAL HISTORY:  1. Congestive heart failure. 2. Diabetes. 3. Atrial fibrillation. 4. Hypertension. 5. History of back surgery. 6. Two cardiac stents. 7. Recent angioplasty. 8. Recent foot surgery. 9. Hysterectomy. 10. Pacemaker.   ALLERGIES: Morphine, Toprol-XL, codeine, Ceftin, sulfa, Novocain, epinephrine, and Toradol.   CURRENT MEDICATIONS:  1. Plavix 75 mg daily. 2. Pantoprazole 40 mg daily.  3. Magnesium 400 mg daily. 4. Isosorbide mononitrate 30 mg half a tablet daily.  5. Vitamin D.  6. Furosemide 80 mg daily. 7. Aspirin 81 mg daily. 8. Januvia 100 mg daily  9. Carvedilol 6.25 mg twice a day. 10. Glimepiride 4 mg 2 times a day. 11. Methocarbamol 750 mg 3 times a day. 12. Losartan 25 mg once a day. 13. Zolpidem 5 mg once a day at bedtime. 14. Dicloxacillin 500 mg every six hours. 15. OxyContin 20 mg orally as needed. 16. Gabapentin 100 mg 2 at night and 1 in the morning.   PHYSICAL EXAMINATION: She is an alert, well oriented 81 year old female in no apparent distress. Vital signs are stable. Lower extremity exam reveals vascular pulses are  difficult to palpate but she did have good capillary refill to her toes at this juncture on the left foot. Dermatologically she does have a wound approximately 3 x 2 cm but it is fairly deep protruding through the joint capsule with exposed metatarsal head in the region. This has been going on for about a month.   CLINICAL IMPRESSION: Explained the risk for osteomyelitis as well as cellulitis and infection to the foot and lack of healing to the wound.   TREATMENT PLAN: We are going to get a bone scan on her either today or tomorrow morning. Based on that, will do surgical planning to see if we can get this cleaned up. Meanwhile, will continue to dress the foot daily until we select a course of surgical and clinical management to try and promote good healing to this area.   ____________________________ Gerrit Heck. Amadu Schlageter, DPM mgt:drc D: 07/11/2011 15:36:56 ET T: 07/11/2011 15:53:43 ET JOB#: [id number]  cc: Gerrit Heck Tomio Kirk, DPM, <Dictator> Perry Mount MD ELECTRONICALLY SIGNED 08/08/2011 11:29

## 2014-10-16 NOTE — Consult Note (Signed)
Impression: 81yo WF w/ h/o DM, recent Methacillin Sensitive Staph aureus diabetic foot infection admitted with possible osteomyelitis of the left foot.  Her prior cultures grew Methacillin Sensitive Staph aureus.  She did not respond to oral dicloxacillin.  She is to go for I&D tomorrow.  Will await findings and cultures. Continue vanco and zosyn for now. If she has osteomyelitis, will likely need 6 weeks of IV therapy.  Electronic Signatures: Anamari Galeas, Heinz Knuckles (MD)  (Signed on 18-Jan-13 16:36)  Authored  Last Updated: 18-Jan-13 16:36 by Jolan Upchurch, Heinz Knuckles (MD)

## 2014-10-16 NOTE — Consult Note (Signed)
PATIENT NAME:  [name], [name] MR#:  [id number] DATE OF BIRTH:  [birth year]  DATE OF CONSULTATION:  07/11/2011  REFERRING PHYSICIAN:  Albertine Patricia, DPM  CONSULTING PHYSICIAN:  Cherre Huger, MD PRIMARY CARE PHYSICIAN: Tracie Harrier, MD   REASON FOR CONSULTATION: Internal Medicine evaluation and follow-up.   REASON FOR ADMISSION: Possible left foot osteomyelitis.   HISTORY OF PRESENT ILLNESS: The patient is an 81 year old female with multiple medical problems including congestive heart failure, diabetes, atrial fibrillation, hypertension, history of coronary artery disease, status post stent placement, who developed an ulcer on the bottom of her left foot in December. She was admitted at that time and had I and D and sesamoid bone removal. The patient also had undergone angioplasty of her left posterior tibial artery in December. Because her ulcer was nonhealing, it was hoped that the angioplasty would improve perfusion and help the ulcer heal. However, the ulcer was not healing as anticipated. The patient had a Wound VAC placed for one week; however, that did not help with the healing process. Dr. Elvina Mattes noted that she had exposed bone and there was a risk of osteomyelitis. Therefore, he admitted her to the hospital for IV antibiotics and possible further surgical intervention. The patient herself denies any problems at present.   PAST MEDICAL HISTORY:  1. Peripheral arterial disease, status post angioplasty left posterior tibial artery on 06/24/2011.  2. History of congestive heart failure.  3. Diabetes.  4. Atrial fibrillation, status post pacemaker placement.  5. History of chronic anemia.  6. Gastroesophageal reflux disease.  7. Hypertension.  8. Coronary artery disease, status post stent placement.   PAST SURGICAL HISTORY:  1. History of back surgery x2.  2. History of two cardiac stents.  3. Angioplasty, left posterior tibial artery 05/2011.  4. History of foot surgery with I  and D and removal of bone in December 2012. 5. Hysterectomy.  6. Pacemaker placement.  7. Appendectomy.  8. Hysterectomy.   ALLERGIES: Morphine, Toprol-XL, codeine, Ceftin, sulfa, novocaine, epinephrine, Toradol, Cipro.   CURRENT MEDICATIONS:  1. Aspirin 81 mg daily.  2. Coreg 6.25 mg b.i.d.  3. Multivitamin 1 tablet daily.  4. Lasix 80 mg daily.  5. Neurontin 100 mg in a.m. and 200 mg at bedtime.  6. Glimepiride 4 mg b.i.d.  7. Isosorbide mononitrate 50 mg daily.  8. Januvia 100 mg daily.  9. Losartan 25 mg daily.  10. Magnesium oxide 400 mg b.i.d.  11. Methocarbamol 750 mg t.i.d. p.r.n.  12. OxyContin 20 mg b.i.d.  13. Protonix 40 mg daily.  14. Plavix 75 mg daily.  15. Vitamin D 400 international units daily.  16. Ambien 5 mg at bedtime p.r.n.   FAMILY HISTORY: Father had congestive heart failure. Mother had heart disease and cerebrovascular accident.   SOCIAL HISTORY: The patient denies any history of smoking, alcohol or drug abuse. She is married and lives with her husband.   PHYSICAL EXAMINATION:  VITAL SIGNS: Temperature afebrile, pulse 68, respiratory rate 12, blood pressure 110/70, pulse oximetry 98% on room air.   GENERAL: The patient is an elderly Caucasian female who is lying comfortably in bed.   HEENT: Head: Atraumatic, normocephalic. Eyes: There is mild pallor, icterus, and cyanosis. Pupils are equal, round, and reactive to light and accommodation. Extraocular movements are intact. ENT: Wet mucous membranes. No oropharyngeal erythema or thrush.   NECK: Supple. No masses. No JVD. No thyromegaly. No lymphadenopathy.   CHEST WALL: No tenderness to palpation. Not using accessory muscles of  respiration. No intercostal muscle retractions.   LUNGS: Bilaterally clear to auscultation. No wheezing, rales or rhonchi.   CARDIOVASCULAR: S1, S2 irregularly irregular. There is a murmur. No rubs or gallops.   ABDOMEN: Soft, nontender, nondistended. No guarding, no  rigidity. No organomegaly. Normal bowel sounds.   SKIN: No rashes or lesions.   PERIPHERIES: No pedal edema. Poorly palpable pedal pulses. The patient's left foot is in a dressing.   MUSCULOSKELETAL: No cyanosis or clubbing.   NEUROLOGICAL: Awake, alert, oriented x3. Nonfocal neurological exam. Cranial nerves are grossly intact.   PSYCHIATRIC: Normal mood and affect.   LABORATORY DATA: White count 9.6, hemoglobin 11.5, hematocrit 35.4, platelets 174, glucose 204, BUN 27, creatinine 0.91. Normal electrolytes.  CO2 33. ESR 35.   ASSESSMENT AND PLAN: An 81 year old female with past medical history of peripheral arterial disease, diabetes, atrial fibrillation, hypertension, admitted for a nonhealing left foot diabetic ulcer.   1. Left foot nonhealing ulcer: The patient had undergone angioplasty and I and D in December of 2012. The wound on her left foot has not healed as yet in spite of being on a Wound VAC for a week. Dr. Elvina Mattes noted that there was bone exposed, and she is at high risk of osteomyelitis. Her  ESR is elevated, although her white count is normal, which could be because she was on antibiotics as an outpatient. I agree with continuing vancomycin and Zosyn. Dr. Clayborn Bigness has been consulted. The patient has been scheduled for a bone scan to rule out underlying osteomyelitis. She cannot have an MRI due to a pacemaker.               2. History of congestive heart failure: The patient's ejection fraction is unknown; however, there is currently no evidence of decompensation. We will continue the patient's Lasix.  3. Diabetes: The patient requires strict diabetic control to aid in the healing of her left foot. We will continue the patient's Januvia. We will place on insulin sliding scale and carbohydrate-controlled diet.  4. Atrial fibrillation: Appears to be rate controlled at present. We will continue the patient's Coreg. The patient does not appear to be on any anticoagulation with Coumadin.  She is on an aspirin and Plavix. We will continue her aspirin. We will hold her Plavix in case she needs any surgical intervention.  5. Hypertension: Appears to be well controlled at present.  6. History of gastroesophageal reflux disease: We will continue the patient's PPI.  7. Diabetic neuropathy: We will continue the patient's Neurontin.  8. Vitamin D deficit: We will continue vitamin D supplementation.   I reviewed all medical records and discussed with the patient and her husband at bedside the plan of care and management.   TIME SPENT:   75 minutes.   ____________________________ Cherre Huger, MD sp:cbb D: 07/11/2011 17:05:54 ET T: 07/12/2011 08:00:35 ET JOB#: [id number]  cc: Cherre Huger, MD, <Dictator> Tracie Harrier, MD Cherre Huger MD ELECTRONICALLY SIGNED 07/12/2011 21:32

## 2014-10-17 ENCOUNTER — Ambulatory Visit
Admit: 2014-10-17 | Disposition: A | Discharge status: Home / Self Care | Payer: Self-pay | Attending: Oncology | Admitting: Oncology

## 2014-10-21 ENCOUNTER — Ambulatory Visit
Admit: 2014-10-21 | Disposition: A | Discharge status: Home / Self Care | Payer: Self-pay | Attending: Oncology | Admitting: Oncology

## 2014-10-21 LAB — LACTATE DEHYDROGENASE: LDH: 160 U/L

## 2014-10-21 LAB — COMPREHENSIVE METABOLIC PANEL
ANION GAP: 9 (ref 7–16)
Albumin: 4 g/dL
Alkaline Phosphatase: 91 U/L
BUN: 36 mg/dL — ABNORMAL HIGH
Bilirubin,Total: 0.8 mg/dL
CHLORIDE: 97 mmol/L — AB
CREATININE: 1.02 mg/dL — AB
Calcium, Total: 8.9 mg/dL
Co2: 31 mmol/L
EGFR (Non-African Amer.): 49 — ABNORMAL LOW
GFR CALC AF AMER: 57 — AB
Glucose: 130 mg/dL — ABNORMAL HIGH
Potassium: 4.4 mmol/L
SGOT(AST): 17 U/L
SGPT (ALT): 11 U/L — ABNORMAL LOW
Sodium: 137 mmol/L
TOTAL PROTEIN: 7.1 g/dL

## 2014-10-21 LAB — CBC CANCER CENTER
Basophil #: 0.1 x10 3/mm (ref 0.0–0.1)
Basophil %: 1.7 %
EOS PCT: 3.1 %
Eosinophil #: 0.2 x10 3/mm (ref 0.0–0.7)
HCT: 34.5 % — AB (ref 35.0–47.0)
HGB: 11.2 g/dL — AB (ref 12.0–16.0)
Lymphocyte #: 0.9 x10 3/mm — ABNORMAL LOW (ref 1.0–3.6)
Lymphocyte %: 13.2 %
MCH: 29.9 pg (ref 26.0–34.0)
MCHC: 32.3 g/dL (ref 32.0–36.0)
MCV: 92 fL (ref 80–100)
Monocyte #: 0.8 x10 3/mm (ref 0.2–0.9)
Monocyte %: 11.1 %
Neutrophil #: 5 x10 3/mm (ref 1.4–6.5)
Neutrophil %: 70.9 %
Platelet: 156 x10 3/mm (ref 150–440)
RBC: 3.74 10*6/uL — AB (ref 3.80–5.20)
RDW: 15.6 % — ABNORMAL HIGH (ref 11.5–14.5)
WBC: 7.1 x10 3/mm (ref 3.6–11.0)

## 2014-11-02 ENCOUNTER — Other Ambulatory Visit: Payer: Self-pay | Admitting: *Deleted

## 2014-11-02 DIAGNOSIS — R911 Solitary pulmonary nodule: Secondary | ICD-10-CM

## 2014-11-09 ENCOUNTER — Other Ambulatory Visit: Payer: Self-pay | Admitting: Oncology

## 2014-11-09 ENCOUNTER — Ambulatory Visit: Payer: Self-pay | Admitting: Oncology

## 2014-11-09 DIAGNOSIS — R591 Generalized enlarged lymph nodes: Secondary | ICD-10-CM

## 2014-11-09 DIAGNOSIS — R911 Solitary pulmonary nodule: Secondary | ICD-10-CM

## 2014-11-10 ENCOUNTER — Other Ambulatory Visit: Payer: Self-pay | Admitting: Oncology

## 2014-11-10 DIAGNOSIS — R911 Solitary pulmonary nodule: Secondary | ICD-10-CM | POA: Insufficient documentation

## 2014-11-11 ENCOUNTER — Ambulatory Visit
Admission: RE | Admit: 2014-11-11 | Discharge: 2014-11-11 | Disposition: A | Discharge status: Home / Self Care | Payer: Medicare PPO | Source: Ambulatory Visit | Attending: Oncology | Admitting: Oncology

## 2014-11-11 DIAGNOSIS — K439 Ventral hernia without obstruction or gangrene: Secondary | ICD-10-CM | POA: Diagnosis not present

## 2014-11-11 DIAGNOSIS — R918 Other nonspecific abnormal finding of lung field: Secondary | ICD-10-CM | POA: Insufficient documentation

## 2014-11-11 DIAGNOSIS — K5730 Diverticulosis of large intestine without perforation or abscess without bleeding: Secondary | ICD-10-CM | POA: Diagnosis not present

## 2014-11-11 DIAGNOSIS — N8110 Cystocele, unspecified: Secondary | ICD-10-CM | POA: Diagnosis not present

## 2014-11-11 DIAGNOSIS — R911 Solitary pulmonary nodule: Secondary | ICD-10-CM

## 2014-11-11 HISTORY — DX: Heart failure, unspecified: I50.9

## 2014-11-11 HISTORY — DX: Type 2 diabetes mellitus without complications: E11.9

## 2014-11-11 LAB — GLUCOSE, CAPILLARY: Glucose-Capillary: 125 mg/dL — ABNORMAL HIGH (ref 65–99)

## 2014-11-11 MED ORDER — FLUDEOXYGLUCOSE F - 18 (FDG) INJECTION
11.9100 | Freq: Once | INTRAVENOUS | Status: AC | PRN
  Administered 2014-11-11: 11.91 via INTRAVENOUS

## 2014-11-16 ENCOUNTER — Inpatient Hospital Stay: Payer: Medicare PPO | Attending: Oncology | Admitting: Oncology

## 2014-11-16 ENCOUNTER — Other Ambulatory Visit: Payer: Self-pay | Admitting: Oncology

## 2014-11-16 VITALS — BP 115/71 | HR 73 | Temp 97.5°F | Wt 189.3 lb

## 2014-11-16 DIAGNOSIS — I509 Heart failure, unspecified: Secondary | ICD-10-CM | POA: Diagnosis not present

## 2014-11-16 DIAGNOSIS — R590 Localized enlarged lymph nodes: Secondary | ICD-10-CM | POA: Insufficient documentation

## 2014-11-16 DIAGNOSIS — Z79899 Other long term (current) drug therapy: Secondary | ICD-10-CM | POA: Insufficient documentation

## 2014-11-16 DIAGNOSIS — E119 Type 2 diabetes mellitus without complications: Secondary | ICD-10-CM

## 2014-11-16 DIAGNOSIS — R911 Solitary pulmonary nodule: Secondary | ICD-10-CM | POA: Insufficient documentation

## 2014-12-04 NOTE — Progress Notes (Signed)
[facility]  [phone] [phone]  ID: [name] OB: January 15, 1925  MR#: [id number]  EQA#:[id number]  Patient Care Team: Tracie Harrier, MD as PCP - General (Internal Medicine)  CHIEF COMPLAINT:  Chief Complaint  Patient presents with  . Follow-up    Results from CT and PET scan     INTERVAL HISTORY: Patient returns to clinic today for further evaluation and discussion of her PET scan results. She continues to have dyspnea on exertion which she blames on "poor posture from back surgery". She otherwise feels well. She has no neurologic complaints. She denies any weight loss. She denies any recent fevers. She has no chest pain, hemoptysis, or cough. She denies any nausea, vomiting, constipation, or diarrhea. She has no urinary complaints. Patient otherwise feels well and offers no further specific complaints.  REVIEW OF SYSTEMS:   Review of Systems  Constitutional: Negative for fever, chills and weight loss.  Respiratory: Positive for shortness of breath.   Cardiovascular: Negative.     As per HPI. Otherwise, a complete review of systems is negatve.  PAST MEDICAL HISTORY: Past Medical History  Diagnosis Date  . CHF (congestive heart failure)   . Diabetes mellitus without complication     PAST SURGICAL HISTORY: No past surgical history on file.  FAMILY HISTORY: Prostate cancer, rectal cancer, DVT, CAD, hypertension.     ADVANCED DIRECTIVES:    HEALTH MAINTENANCE: History  Substance Use Topics  . Smoking status: Not on file  . Smokeless tobacco: Not on file  . Alcohol Use: Not on file     Colonoscopy:  PAP:  Bone density:  Lipid panel:  Allergies  Allergen Reactions  . Cefuroxime Hives  . Cephalosporins     Fainting   . Codeine Nausea Only  . Epinephrine     Fainting   . Morphine And Related Nausea Only  . Rocephin [Ceftriaxone Sodium In Dextrose] Swelling  . Ciprofloxacin Itching, Rash, Swelling and  Other (See Comments)    Itching and swelling  . Latex Rash  . Lidocaine Rash  . Procaine Rash and Other (See Comments)    Current Outpatient Prescriptions  Medication Sig Dispense Refill  . aspirin 81 MG tablet Take 81 mg by mouth daily.    . carvedilol (COREG) 6.25 MG tablet Take 6.25 mg by mouth daily.    Marland Kitchen docusate sodium (COLACE) 100 MG capsule Take 100 mg by mouth daily.    Marland Kitchen ELIQUIS 5 MG TABS tablet Take 5 mg by mouth 2 (two) times daily.    Marland Kitchen escitalopram (LEXAPRO) 10 MG tablet Take 10 mg by mouth daily.    . fentaNYL (DURAGESIC - DOSED MCG/HR) 50 MCG/HR Place 50 mcg onto the skin every 3 (three) days.    Marland Kitchen glimepiride (AMARYL) 4 MG tablet Take 4 mg by mouth 2 (two) times daily after a meal.    . JANUVIA 100 MG tablet Take 100 mg by mouth daily.    Marland Kitchen losartan (COZAAR) 25 MG tablet Take 25 mg by mouth daily.    . Magnesium Oxide -Mg Supplement 400 MG CAPS Take 400 mg by mouth daily.    . Melatonin 1 MG CAPS Take 1 mg by mouth daily.    . metolazone (ZAROXOLYN) 2.5 MG tablet Take 2.5 mg by mouth daily.    Marland Kitchen oxycodone (OXY-IR) 5 MG capsule Take 5 mg by mouth every 4 (four) hours as needed.    . pantoprazole (PROTONIX) 40 MG tablet Take 40 mg by  mouth daily.    . vitamin B-12 (CYANOCOBALAMIN) 100 MCG tablet Take 100 mcg by mouth daily.    . vitamin C (ASCORBIC ACID) 500 MG tablet Take 500 mg by mouth daily.    . Vitamin D, Ergocalciferol, (DRISDOL) 50000 UNITS CAPS capsule Take 50,000 Units by mouth daily.     No current facility-administered medications for this visit.    OBJECTIVE: Filed Vitals:   11/16/14 1136  BP: 115/71  Pulse: 73  Temp: 97.5 F (36.4 C)     Body mass index is 29.64 kg/(m^2).    ECOG FS:0 - Asymptomatic  General: Well-developed, well-nourished, no acute distress. Eyes: anicteric sclera. Lungs: Clear to auscultation bilaterally. Heart: Regular rate and rhythm. No rubs, murmurs, or gallops. Abdomen: Soft, nontender, nondistended. No organomegaly  noted, normoactive bowel sounds. Musculoskeletal: No edema, cyanosis, or clubbing. Neuro: Alert, answering all questions appropriately. Cranial nerves grossly intact. Skin: No rashes or petechiae noted. Psych: Normal affect.    LAB RESULTS:  Lab Results  Component Value Date   NA 137 10/21/2014   K 4.4 10/21/2014   CL 97* 10/21/2014   CO2 31 10/21/2014   GLUCOSE 130* 10/21/2014   BUN 36* 10/21/2014   CREATININE 1.02* 10/21/2014   CALCIUM 8.9 10/21/2014   PROT 7.1 10/21/2014   ALBUMIN 4.0 10/21/2014   AST 17 10/21/2014   ALT 11* 10/21/2014   ALKPHOS 91 10/21/2014   GFRNONAA 49* 10/21/2014   GFRAA 57* 10/21/2014    Lab Results  Component Value Date   WBC 7.1 10/21/2014   NEUTROABS 5.0 10/21/2014   HGB 11.2* 10/21/2014   HCT 34.5* 10/21/2014   MCV 92 10/21/2014   PLT 156 10/21/2014     STUDIES: Nm Pet Image Restag (ps) Skull Base To Thigh  11/11/2014   CLINICAL DATA:  Initial treatment strategy for solitary pulmonary nodule.  EXAM: NUCLEAR MEDICINE PET SKULL BASE TO THIGH  TECHNIQUE: 11.9 mCi F-18 FDG was injected intravenously. Full-ring PET imaging was performed from the skull base to thigh after the radiotracer. CT data was obtained and used for attenuation correction and anatomic localization.  FASTING BLOOD GLUCOSE:  Value: 125 mg/dl  COMPARISON:  10/06/2014 and 01/14/2013  FINDINGS: NECK  No hypermetabolic lymph nodes in the neck.  CHEST  The chronic mediastinal adenopathy referenced on the prior exam has metabolic activity just slightly above background mediastinal activity. For example, a 1.6 cm in short axis right paratracheal lymph node on CT image 74 of series 3 has maximum standard uptake value 3.5 and a subcarinal node measuring 1.9 cm on image 84 series 3 has a maximum standard uptake value of 3.5. Background mediastinal activity 2.7.  None of the small nodules are hypermetabolic.  Cardiomegaly particularly including the right heart. Extensive atherosclerosis.  Previously rib reported pulmonary nodules are stable.  Mosaic attenuation in the lungs with some indistinctness of vasculature in the darker segments, favoring small airways or small vessel disease (obstructive bronchiolitis or chronic embolus).  ABDOMEN/PELVIS  No abnormal hypermetabolic activity within the liver, pancreas, adrenal glands, or spleen. No hypermetabolic lymph nodes in the abdomen or pelvis. Aortoiliac atherosclerotic vascular disease. Right kidney lower pole photopenic cyst. Ventral hernia contains part of the transverse colon. Sigmoid diverticulosis. Pelvic floor laxity noted with small cystocele.  SKELETON  No focal hypermetabolic activity to suggest skeletal metastasis. The right L1 and L2 pedicle screws capture of the right lateral cortex of the pedicles.  IMPRESSION: 1. Low-level activity in the chronic mediastinal adenopathy, just above that  of background mediastinal activity. Appearance favors a chronic inflammatory process, with a low-grade lymphoma considered much less likely. These nodes could be readily sampled by bronchoscopy if not already performed. 2. None of the pulmonary nodules are hypermetabolic, although all are at or below the lower limit of sensitive PET-CT size thresholds, and probably merits observation. Observation is particularly recommended for the 8 mm right lower lobe pulmonary nodule which was new on last months CT scan. 3. Mosaic attenuation in the lungs suggesting obstructive bronchiolitis or chronic pulmonary embolus. 4. Atherosclerosis and cardiomegaly. 5. Ventral hernia containing transverse colon. 6. Sigmoid diverticulosis. 7. Pelvic floor laxity with small cystocele.   Electronically Signed   By: Van Clines M.D.   On: 11/11/2014 12:16    ASSESSMENT: Mediastinal lymphadenopathy and new pulmonary nodule.  PLAN:    1. Mediastinal lymphadenopathy and new pulmonary nodule: PET scan results reviewed independently and reported as above. Mediastinal  lymphadenopathy not concerning for underlying malignancy and her pulmonary nodule is at the lower limit of sensitivity for PET scan. No intervention is needed at this time. Will repeat CT scan in 3 months for further evaluation and to assess for any interval change.   Patient expressed understanding and was in agreement with this plan. She also understands that She can call clinic at any time with any questions, concerns, or complaints.   No matching staging information was found for the patient.  Lloyd Huger, MD   12/04/2014 5:40 PM

## 2015-01-24 ENCOUNTER — Ambulatory Visit: Payer: Medicare PPO | Attending: Internal Medicine | Admitting: Physical Therapy

## 2015-01-24 ENCOUNTER — Encounter: Payer: Self-pay | Admitting: Physical Therapy

## 2015-01-24 DIAGNOSIS — R262 Difficulty in walking, not elsewhere classified: Secondary | ICD-10-CM | POA: Diagnosis not present

## 2015-01-24 DIAGNOSIS — R293 Abnormal posture: Secondary | ICD-10-CM | POA: Diagnosis present

## 2015-01-24 DIAGNOSIS — M6281 Muscle weakness (generalized): Secondary | ICD-10-CM | POA: Diagnosis present

## 2015-01-24 DIAGNOSIS — M2560 Stiffness of unspecified joint, not elsewhere classified: Secondary | ICD-10-CM

## 2015-01-24 NOTE — Therapy (Signed)
[facility] [address] [place], [place], [address] Phone: [phone]   Fax:  [phone]  Physical Therapy Evaluation  Patient Details  [name] MRN: [id number] Date of Birth: 07/09/1924 Referring Provider:  Tracie Harrier, MD  Encounter Date: 01/24/2015      PT End of Session - 01/24/15 1225    Visit Number 1   Number of Visits 10   Date for PT Re-Evaluation 02/21/15   Authorization - Visit Number 1   Authorization - Number of Visits 10   PT Start Time 1024   PT Stop Time 6962   PT Time Calculation (min) 53 min   Equipment Utilized During Treatment Gait belt;Other (comment)  parallel bars   Activity Tolerance Patient limited by pain;Patient limited by fatigue   Behavior During Therapy [facility] for tasks assessed/performed      Past Medical History  Diagnosis Date  . CHF (congestive heart failure)   . Diabetes mellitus without complication     Past Surgical History  Procedure Laterality Date  . Pacemaker placement      There were no vitals filed for this visit.  Visit Diagnosis:  Difficulty walking  Muscle weakness  Joint stiffness  Abnormal posture      Subjective Assessment - 01/24/15 1030    Subjective Pt. reports 7/10 back pain currently at rest (with meds/patch).  No benefits from meds reported except she is able sleep.     Limitations Lifting;Standing;Walking;House hold activities   How long can you sit comfortably? 15-20 min.    How long can you stand comfortably? feels better on back   How long can you walk comfortably? with use of rollator   Patient Stated Goals Decrease back pain/ increase B LE muscle strength/ amb. with SPC   Currently in Pain? Yes   Pain Score 7    Pain Location Back   Pain Orientation Mid;Lower;Right   Pain Descriptors / Indicators Aching;Constant   Pain Type Chronic pain      R/L MMT: quad: (4-/4-), hamstring (3/3), hip flexion (3/3), hip abd. (4/4),  add. (4/4)- symmetrical bilaterally.  B shoulder AROM WFL.  R/L UE: shoulder flexion (4/3), abd. (4/4).  L knee AROM (-5 to 105 deg.), R knee AROM (-9 to 110 deg.).  B proximal hamstring tightness at 50 deg.  Slight LLD with R<L secondary to R hamstring tightness/ R ASIS elevated.   BERG Balance score: 41/56 ODI: 48%   Medical Necessity for Tx: Pt will benefit from PT to strengthen her LEs, improve her gait and balance to  decrease her dependence on her RW. This will help decrease pt's fear of falling         PT Education - 01/24/15 1116    Education provided Yes   Education Details see HEP.   Person(s) Educated Patient;Spouse   Methods Explanation;Demonstration;Verbal cues;Handout   Comprehension Verbalized understanding;Returned demonstration;Need further instruction          PT Short Term Goals - 01/24/15 1237    PT SHORT TERM GOAL #1   Title --   Time --   Period --   Status --   PT SHORT TERM GOAL #2   Title --   Time --   Period --   Status --   PT SHORT TERM GOAL #3   Title --   Time --   Period --   Status --           PT Long  Term Goals - 01/24/15 1440    PT LONG TERM GOAL #1   Title Pt will improve her BERG score to > 46/56 in order to promote safety/ decrease fall risk.    Baseline 41/56 on 8/2   Time 4   Period Weeks   Status New   PT LONG TERM GOAL #2   Title Pt will maintain tandem stance with no UE support for 30 seconds to improve standing activities in kitchen.    Time 4   Period Weeks   Status New   PT LONG TERM GOAL #3   Title Pt. I with HEP to increase B LE muscle strength 1/2 MMT to improve standing balance/ fucntional mobility.     Time 4   Period Weeks   Status New   PT LONG TERM GOAL #4   Title Pt. will demonstrate upright standing posture/ proper lifting technique to improve ADL.     Time 4   Period Weeks   Status New   PT LONG TERM GOAL #5   Title Pt. will ambulate with use of SPC in house with 2-point gait pattern and no LOB  safely.    Time 4   Period Weeks   Status New               Plan - 01/24/15 1232    Clinical Impression Statement Pt is a pleasant 81 y.o F that presents with generalized weakness, deconditioning, impaired balance, impaired strength and decreased activity tolerance. Pt is currenlty limited by her back pain with positional changes and fearful of falling. Pt will perform balance tasks in the parallel bars without UE support but requires verbal cuing and reassurance to maintain no UE support. Pt is unable to perform sit to stand transfer without UE Support. Pt's BERG score: 41/56; ODI: 48%. R/L MMT: quad: (4-/4-), hamstring (3/3), hip flexion (3/3), hip abd. (4/4), add. (4/4)- symmetrical bilaterally.  B shoulder AROM WFL.  R/L UE: shoulder flexion (4/3), abd. (4/4).  L knee AROM (-5 to 105 deg.), R knee AROM (-9 to 110 deg.).  B proximal hamstring tightness at 50 deg.  Slight LLD with R<L secondary to R hamstring tightness/ R ASIS elevated.  Pt. ambulated with a 3 wheeled rollator with increased trunk flexion and decreased hip and knee extension. Pt ambulates with shuffled step to gait pattern and a decreased cadence. Pt requires increased time to complete tasks due to impaired balance and for safety. Pt will benefit from skilled PT in order to improve her safety with functional mobility and decrease her assistance needed for ambulation. Pt will benefit from PT to improve strength,  balance and overall mobiltiy.    Pt will benefit from skilled therapeutic intervention in order to improve on the following deficits Abnormal gait;Decreased endurance;Hypomobility;Increased edema;Decreased activity tolerance;Decreased strength;Pain;Decreased balance;Decreased mobility;Difficulty walking;Decreased range of motion;Decreased coordination;Decreased safety awareness;Impaired flexibility;Improper body mechanics;Postural dysfunction;Increased muscle spasms   Rehab Potential Good   PT Frequency 2x / week   PT  Duration 4 weeks   PT Treatment/Interventions ADLs/Self Care Home Management;Aquatic Therapy;Balance training;Therapeutic exercise;Therapeutic activities;Cryotherapy;Electrical Stimulation;Moist Heat;Manual techniques;Stair training;Gait training;Neuromuscular re-education   PT Next Visit Plan Balance training, postural correction, exercises for hip weakness, address impaired knee extension.    PT Home Exercise Plan see above images; standing balance and pre-gait activities. Instructed to perform at the kitchen counter for balance assistance and safety.    Recommended Other Services Silver Sneakers   Consulted and Agree with Plan of Care Patient  G-Codes - 01/24/15 1446    Functional Assessment Tool Used Oswestry/ Berg/ clinical judgement/ gait   Functional Limitation Mobility: Walking and moving around   Mobility: Walking and Moving Around Current Status [phone]) At least 40 percent but less than 60 percent impaired, limited or restricted   Mobility: Walking and Moving Around Goal Status [phone]) At least 1 percent but less than 20 percent impaired, limited or restricted       Problem List Patient Active Problem List   Diagnosis Date Noted  . Pulmonary nodule 11/10/2014   Pura Spice, PT, DPT # [phone]   01/24/2015, 2:59 PM  [facility] [address] [place], [place], [address] Phone: [phone]   Fax:  [phone]

## 2015-01-29 ENCOUNTER — Emergency Department
Admission: EM | Admit: 2015-01-29 | Discharge: 2015-01-29 | Disposition: A | Discharge status: Home / Self Care | Payer: Medicare PPO | Attending: Student | Admitting: Student

## 2015-01-29 ENCOUNTER — Encounter: Payer: Self-pay | Admitting: Emergency Medicine

## 2015-01-29 DIAGNOSIS — Z91040 Latex allergy status: Secondary | ICD-10-CM | POA: Insufficient documentation

## 2015-01-29 DIAGNOSIS — Y9389 Activity, other specified: Secondary | ICD-10-CM | POA: Diagnosis not present

## 2015-01-29 DIAGNOSIS — Z79899 Other long term (current) drug therapy: Secondary | ICD-10-CM | POA: Diagnosis not present

## 2015-01-29 DIAGNOSIS — W228XXA Striking against or struck by other objects, initial encounter: Secondary | ICD-10-CM | POA: Diagnosis not present

## 2015-01-29 DIAGNOSIS — Y998 Other external cause status: Secondary | ICD-10-CM | POA: Insufficient documentation

## 2015-01-29 DIAGNOSIS — S81821A Laceration with foreign body, right lower leg, initial encounter: Secondary | ICD-10-CM | POA: Insufficient documentation

## 2015-01-29 DIAGNOSIS — E119 Type 2 diabetes mellitus without complications: Secondary | ICD-10-CM | POA: Insufficient documentation

## 2015-01-29 DIAGNOSIS — S81811A Laceration without foreign body, right lower leg, initial encounter: Secondary | ICD-10-CM

## 2015-01-29 DIAGNOSIS — Y9289 Other specified places as the place of occurrence of the external cause: Secondary | ICD-10-CM | POA: Diagnosis not present

## 2015-01-29 MED ORDER — OXYCODONE-ACETAMINOPHEN 5-325 MG PO TABS
1.0000 | ORAL_TABLET | Freq: Once | ORAL | Status: AC
  Administered 2015-01-29: 1 via ORAL
  Filled 2015-01-29: qty 1

## 2015-01-29 MED ORDER — BACITRACIN ZINC 500 UNIT/GM EX OINT
TOPICAL_OINTMENT | CUTANEOUS | Status: AC
  Filled 2015-01-29: qty 0.9

## 2015-01-29 MED ORDER — BACITRACIN-NEOMYCIN-POLYMYXIN OINTMENT TUBE: TOPICAL_OINTMENT | Freq: Once | CUTANEOUS | Status: DC

## 2015-01-29 MED ORDER — LIDOCAINE HCL (PF) 1 % IJ SOLN
INTRAMUSCULAR | Status: AC
  Filled 2015-01-29: qty 5

## 2015-01-29 MED ORDER — LIDOCAINE HCL (PF) 1 % IJ SOLN
10.0000 mL | Freq: Once | INTRAMUSCULAR | Status: AC
  Administered 2015-01-29: 10 mL
  Filled 2015-01-29: qty 10

## 2015-01-29 NOTE — ED Notes (Addendum)
Patient presents to the ED with a wound to her right leg.  Patient states she cut her leg after getting into her car and the car door scraped her right leg.  Patient is on eloquis and reports a significant amount of bleeding.  Patient is alert and oriented x 4.  Patient was able to bear weight on her right leg.  Laceration is about 3inches long and adipose tissue is exposed.

## 2015-01-29 NOTE — ED Provider Notes (Signed)
[facility] Provider Note  ____________________________________________  Time seen: Approximately 4:07 PM  I have reviewed the triage vital signs and the nursing notes.   HISTORY  Chief Complaint Laceration    HPI [name] is a 81 y.o. female patient is a laceration to her right lower leg. Patient states she cut her leg while opening the car door. Patient stated there was moderate hemorrhaging secondary to her being on a blood thinner. Patient state she can bear weight on the lower extremity. Except for pressure dressing no other palliative measures performed. Patient rates her pain discomfort as a 5/10 described as dull.  Past Medical History  Diagnosis Date  . CHF (congestive heart failure)   . Diabetes mellitus without complication     Patient Active Problem List   Diagnosis Date Noted  . Pulmonary nodule 11/10/2014    Past Surgical History  Procedure Laterality Date  . Pacemaker placement    . Leg surgery    . Abdominal hysterectomy    . Tonsillectomy    . Back surgery      x3    Current Outpatient Rx  Name  Route  Sig  Dispense  Refill  . aspirin 81 MG tablet   Oral   Take 81 mg by mouth daily.         . carvedilol (COREG) 6.25 MG tablet   Oral   Take 6.25 mg by mouth daily.         Marland Kitchen docusate sodium (COLACE) 100 MG capsule   Oral   Take 100 mg by mouth daily.         Marland Kitchen ELIQUIS 5 MG TABS tablet   Oral   Take 5 mg by mouth 2 (two) times daily.           Dispense as written.   . escitalopram (LEXAPRO) 10 MG tablet   Oral   Take 10 mg by mouth daily.         . fentaNYL (DURAGESIC - DOSED MCG/HR) 50 MCG/HR   Transdermal   Place 50 mcg onto the skin every 3 (three) days.         Marland Kitchen glimepiride (AMARYL) 4 MG tablet   Oral   Take 4 mg by mouth 2 (two) times daily after a meal.         . JANUVIA 100 MG tablet   Oral   Take 100 mg by mouth daily.           Dispense as written.   Marland Kitchen losartan (COZAAR) 25 MG tablet   Oral   Take 25 mg by mouth daily.         . Magnesium Oxide -Mg Supplement 400 MG CAPS   Oral   Take 400 mg by mouth daily.         . Melatonin 1 MG CAPS   Oral   Take 1 mg by mouth daily.         . metolazone (ZAROXOLYN) 2.5 MG tablet   Oral   Take 2.5 mg by mouth daily.         Marland Kitchen oxycodone (OXY-IR) 5 MG capsule   Oral   Take 5 mg by mouth every 4 (four) hours as needed.         . pantoprazole (PROTONIX) 40 MG tablet   Oral   Take 40 mg by mouth daily.         . vitamin B-12 (CYANOCOBALAMIN) 100 MCG tablet  Oral   Take 100 mcg by mouth daily.         . vitamin C (ASCORBIC ACID) 500 MG tablet   Oral   Take 500 mg by mouth daily.         . Vitamin D, Ergocalciferol, (DRISDOL) 50000 UNITS CAPS capsule   Oral   Take 50,000 Units by mouth daily.           Allergies Cefuroxime; Cephalosporins; Codeine; Epinephrine; Morphine and related; Rocephin; Ciprofloxacin; Latex; Lidocaine; and Procaine  No family history on file.  Social History History  Substance Use Topics  . Smoking status: Never Smoker   . Smokeless tobacco: Not on file  . Alcohol Use: No    Review of Systems Constitutional: No fever/chills Eyes: No visual changes. ENT: No sore throat. Cardiovascular: Denies chest pain. Respiratory: Denies shortness of breath. Gastrointestinal: No abdominal pain.  No nausea, no vomiting.  No diarrhea.  No constipation. Genitourinary: Negative for dysuria. Musculoskeletal: Negative for back pain. Skin: Negative for rash. Neurological: Negative for headaches, focal weakness or numbness. Endocrine:Diabetes Hematological/Lymphatic: Allergic/Immunilogical: See medication list 10-point ROS otherwise negative.  ____________________________________________   PHYSICAL EXAM:  VITAL SIGNS: ED Triage Vitals  Enc Vitals Group     BP 01/29/15 1544 102/52 mmHg     Pulse Rate 01/29/15 1544 73     Resp 01/29/15  1544 20     Temp 01/29/15 1544 97.5 F (36.4 C)     Temp Source 01/29/15 1544 Oral     SpO2 01/29/15 1544 92 %     Weight 01/29/15 1544 170 lb (77.111 kg)     Height 01/29/15 1544 5\' 7"  (1.702 m)     Head Cir --      Peak Flow --      Pain Score 01/29/15 1545 5     Pain Loc --      Pain Edu? --      Excl. in [place]? --     Constitutional: Alert and oriented. Well appearing and in no acute distress. Eyes: Conjunctivae are normal. PERRL. EOMI. Head: Atraumatic. Nose: No congestion/rhinnorhea. Mouth/Throat: Mucous membranes are moist.  Oropharynx non-erythematous. Neck: No stridor. No cervical spine tenderness to palpation. Hematological/Lymphatic/Immunilogical: No cervical lymphadenopathy. Cardiovascular: Normal rate, regular rhythm. Grossly normal heart sounds.  Good peripheral circulation. Respiratory: Normal respiratory effort.  No retractions. Lungs CTAB. Gastrointestinal: Soft and nontender. No distention. No abdominal bruits. No CVA tenderness. Musculoskeletal: No lower extremity tenderness nor edema.  No joint effusions. Neurologic:  Normal speech and language. No gross focal neurologic deficits are appreciated. No gait instability. Skin:  Skin is warm, dry and intact. No rash noted. 3.5 cm laceration to the right lower leg. Moderate amount of capillary bleeding. Psychiatric: Mood and affect are normal. Speech and behavior are normal.  ____________________________________________   LABS (all labs ordered are listed, but only abnormal results are displayed)  Labs Reviewed - No data to display ____________________________________________  EKG   ____________________________________________  RADIOLOGY   ____________________________________________   PROCEDURES  Procedure(s) performed: See procedure note  Critical Care performed: No  ______________________LACERATION REPAIR Performed by: Sable Feil Authorized by: Sable Feil Consent: Verbal consent  obtained. Risks and benefits: risks, benefits and alternatives were discussed Consent given by: patient Patient identity confirmed: provided demographic data Prepped and Draped in normal sterile fashion Wound explored  Laceration Location: Right lower leg  Laceration Length: 3.5 cm  No Foreign Bodies seen or palpated  Anesthesia: local infiltration  Local anesthetic: lidocaine  1% without epinephrine   Anesthetic total: 8 mL   Irrigation method: syringe Amount of cleaning: standard  Skin closure: 40 proline   Number of sutures: 12   Technique: Interrupted   Patient tolerance: Patient tolerated the procedure well with no immediate complications. ______________________   INITIAL IMPRESSION / ASSESSMENT AND PLAN / ED COURSE  Pertinent labs & imaging results that were available during my care of the patient were reviewed by me and considered in my medical decision making (see chart for details).  Laceration left lower leg. Patient get advised on home care. Last had his sutures removed in 10 days. Patient advised return by ER for condition worsen. ____________________________________________   FINAL CLINICAL IMPRESSION(S) / ED DIAGNOSES  Final diagnoses:  Leg laceration, right, initial encounter      Sable Feil, PA-C 01/29/15 [place], MD 01/29/15 [phone]

## 2015-01-30 ENCOUNTER — Ambulatory Visit: Payer: Medicare PPO | Admitting: Physical Therapy

## 2015-02-01 ENCOUNTER — Encounter: Payer: Medicare PPO | Admitting: Physical Therapy

## 2015-02-06 ENCOUNTER — Encounter: Payer: Medicare PPO | Admitting: Physical Therapy

## 2015-02-08 ENCOUNTER — Encounter: Payer: Medicare PPO | Admitting: Physical Therapy

## 2015-02-13 ENCOUNTER — Encounter: Payer: Medicare PPO | Admitting: Physical Therapy

## 2015-02-15 ENCOUNTER — Encounter: Payer: Medicare PPO | Admitting: Physical Therapy

## 2015-02-15 ENCOUNTER — Ambulatory Visit: Payer: Medicare PPO | Admitting: Physical Therapy

## 2015-02-15 DIAGNOSIS — R293 Abnormal posture: Secondary | ICD-10-CM

## 2015-02-15 DIAGNOSIS — M2560 Stiffness of unspecified joint, not elsewhere classified: Secondary | ICD-10-CM

## 2015-02-15 DIAGNOSIS — M6281 Muscle weakness (generalized): Secondary | ICD-10-CM

## 2015-02-15 DIAGNOSIS — R262 Difficulty in walking, not elsewhere classified: Secondary | ICD-10-CM | POA: Diagnosis not present

## 2015-02-15 NOTE — Therapy (Signed)
[facility] [address] [place], [place], [address] Phone: [phone]   Fax:  [phone]  Physical Therapy Treatment  Patient Details  [name] MRN: [id number] Date of Birth: [birth year] Referring Provider:  Tracie Harrier, MD  Encounter Date: 02/15/2015      PT End of Session - 02/15/15 1705    Visit Number 2   Number of Visits 10   Date for PT Re-Evaluation 02/21/15   Authorization - Visit Number 2   Authorization - Number of Visits 10   PT Start Time 1010   PT Stop Time 1104   PT Time Calculation (min) 54 min   Equipment Utilized During Treatment Gait belt   Activity Tolerance Patient tolerated treatment well;Patient limited by pain;Patient limited by fatigue   Behavior During Therapy [facility] for tasks assessed/performed      Past Medical History  Diagnosis Date  . CHF (congestive heart failure)   . Diabetes mellitus without complication     Past Surgical History  Procedure Laterality Date  . Pacemaker placement    . Leg surgery    . Abdominal hysterectomy    . Tonsillectomy    . Back surgery      x3    There were no vitals filed for this visit.  Visit Diagnosis:  Difficulty walking  Muscle weakness  Joint stiffness  Abnormal posture      Subjective Assessment - 02/15/15 1657    Subjective Pt reports leg is doing better with bandaging but continues to have some minor bleeding. Pt reports back pain 6/10 with all standing tasks that is relieved once she sits down. Pt reports not doing her HEP due to leg and back pain and finding it difficult.    Patient is accompained by: Family member   Limitations Standing;Walking;House hold activities;Lifting   Patient Stated Goals walk with SPC, increase balance and strength, decrease back pain   Currently in Pain? Yes   Pain Score 6    Pain Location Back   Pain Orientation Lower   Pain Descriptors / Indicators Constant   Pain Type Chronic pain         OBJECTIVE: There ex: Standing hip abduction/standing marching/heel raises x 10 each. Seated: hip abduction with Green TB/marching/heel raises/LAQ x 15 each. Gait training: in // bars, gait with SPC with fluid pattern and wider base of support-emphasis on heel strike and keeping R hip under her shoulder with gait. (2 LOB noted due to R hip weakness, corrected with min A and // bars). Gait stepping over blue foam pads- increased cues for L toe clearance-poor carryover. Gait around the gym and out to the car with [facility] and 3 wheeled rollator focusing on fluid step through and reciprocal pattern. Neuro Re-ed: static standing balance and narrow base of support balance. Pt unable to maintain without postural sway without UE. Attempted EC but pt had a backwards LOB and required UE support in // bars.   Pt response for medical necessity: Decreased strength to safely ambulate and perform functional mobility. Pt continues to benefit from strengthening to address back pain and postural deficits in order to safely navigate her home and community areas.          PT Education - 02/15/15 1704    Education provided Yes   Education Details Pt provided handout to change HEP from standing to seated. Pt given green TB to perform seated hip abduction.   Person(s) Educated Patient   Methods  Explanation;Demonstration;Handout   Comprehension Returned demonstration;Verbalized understanding            PT Long Term Goals - 01/24/15 1440    PT LONG TERM GOAL #1   Title Pt will improve her BERG score to > 46/56 in order to promote safety/ decrease fall risk.    Baseline 41/56 on 8/2   Time 4   Period Weeks   Status New   PT LONG TERM GOAL #2   Title Pt will maintain tandem stance with no UE support for 30 seconds to improve standing activities in kitchen.    Time 4   Period Weeks   Status New   PT LONG TERM GOAL #3   Title Pt. I with HEP to increase B LE muscle strength 1/2 MMT to improve standing  balance/ fucntional mobility.     Time 4   Period Weeks   Status New   PT LONG TERM GOAL #4   Title Pt. will demonstrate upright standing posture/ proper lifting technique to improve ADL.     Time 4   Period Weeks   Status New   PT LONG TERM GOAL #5   Title Pt. will ambulate with use of SPC in house with 2-point gait pattern and no LOB safely.    Time 4   Period Weeks   Status New            Plan - 02/15/15 1707    Clinical Impression Statement Pt had her stiches removed on Monday (8/22) and now has steri-stripes over her cut. Pt is limited by back pain (6/10) with all standing activities today. Pt is limited with decreased activity tolerance (noted more in standing than sitting) and c/o back pain with standing exercises. Pt demonstrates B LE weakness. Pt ambulates with R trendelenburg gait pattern and significant R glut med weakness with a narrow base of support, forward flexed trunk posture and decreased cadence and step length. Pt is unsteady with SPC and PT will continue to address gait, balance and strength to increase her safety with home functional mobility.    Pt will benefit from skilled therapeutic intervention in order to improve on the following deficits Abnormal gait;Decreased activity tolerance;Decreased balance;Decreased mobility;Decreased strength;Decreased endurance;Decreased safety awareness;Difficulty walking;Decreased range of motion;Hypomobility;Impaired flexibility;Improper body mechanics;Pain   Rehab Potential Good   PT Frequency 2x / week   PT Duration 4 weeks   PT Treatment/Interventions ADLs/Self Care Home Management;Cryotherapy;Moist Heat;Balance training;Therapeutic exercise;Therapeutic activities;Manual techniques;Patient/family education;Gait training;Stair training;Neuromuscular re-education;Passive range of motion;Functional mobility training   PT Next Visit Plan continue to work towards standing balance exercises and increase gait training outside of the  // bars.  REASSESS GOALS/SCHEDULE   PT Home Exercise Plan see handout for new seated HEP   Consulted and Agree with Plan of Care Patient        Problem List Patient Active Problem List   Diagnosis Date Noted  . Pulmonary nodule 11/10/2014    Lavone Neri, SPT  02/15/2015, 5:37 PM  [place] [facility] [facility] [address] [place], [place], [address] Phone: [phone]   Fax:  [phone]

## 2015-02-16 ENCOUNTER — Ambulatory Visit
Admission: RE | Admit: 2015-02-16 | Discharge: 2015-02-16 | Disposition: A | Discharge status: Home / Self Care | Payer: Medicare PPO | Source: Ambulatory Visit | Attending: Oncology | Admitting: Oncology

## 2015-02-16 DIAGNOSIS — R911 Solitary pulmonary nodule: Secondary | ICD-10-CM | POA: Insufficient documentation

## 2015-02-16 DIAGNOSIS — R590 Localized enlarged lymph nodes: Secondary | ICD-10-CM | POA: Insufficient documentation

## 2015-02-16 HISTORY — DX: Essential (primary) hypertension: I10

## 2015-02-16 MED ORDER — IOHEXOL 300 MG/ML  SOLN
75.0000 mL | Freq: Once | INTRAMUSCULAR | Status: AC | PRN
  Administered 2015-02-16: 60 mL via INTRAVENOUS

## 2015-02-20 ENCOUNTER — Ambulatory Visit: Payer: Medicare PPO | Admitting: Physical Therapy

## 2015-02-20 ENCOUNTER — Encounter: Payer: Self-pay | Admitting: Physical Therapy

## 2015-02-20 ENCOUNTER — Inpatient Hospital Stay: Payer: Medicare PPO | Attending: Oncology | Admitting: Oncology

## 2015-02-20 VITALS — BP 124/65 | HR 68 | Temp 97.2°F | Resp 16

## 2015-02-20 DIAGNOSIS — I509 Heart failure, unspecified: Secondary | ICD-10-CM | POA: Diagnosis not present

## 2015-02-20 DIAGNOSIS — R911 Solitary pulmonary nodule: Secondary | ICD-10-CM | POA: Diagnosis not present

## 2015-02-20 DIAGNOSIS — Z79899 Other long term (current) drug therapy: Secondary | ICD-10-CM | POA: Diagnosis not present

## 2015-02-20 DIAGNOSIS — M6281 Muscle weakness (generalized): Secondary | ICD-10-CM

## 2015-02-20 DIAGNOSIS — M2560 Stiffness of unspecified joint, not elsewhere classified: Secondary | ICD-10-CM

## 2015-02-20 DIAGNOSIS — E119 Type 2 diabetes mellitus without complications: Secondary | ICD-10-CM | POA: Diagnosis not present

## 2015-02-20 DIAGNOSIS — R262 Difficulty in walking, not elsewhere classified: Secondary | ICD-10-CM

## 2015-02-20 DIAGNOSIS — I10 Essential (primary) hypertension: Secondary | ICD-10-CM | POA: Insufficient documentation

## 2015-02-20 DIAGNOSIS — R293 Abnormal posture: Secondary | ICD-10-CM

## 2015-02-20 DIAGNOSIS — R590 Localized enlarged lymph nodes: Secondary | ICD-10-CM | POA: Insufficient documentation

## 2015-02-20 NOTE — Progress Notes (Signed)
Patient hit her leg with the car door and went to the ER where she received stiches, Dr. Elvina Mattes is following up with her leg.

## 2015-02-20 NOTE — Therapy (Signed)
[facility] [facility] [address] [place], [place], [address] Phone: [phone]   Fax:  [phone]  Physical Therapy Treatment  Patient Details  [name] MRN: [id number] Date of Birth: Nov 01, 1924 Referring Provider:  Tracie Harrier, MD  Encounter Date: 02/20/2015      PT End of Session - 02/20/15 1748    Visit Number 3   Number of Visits 10   Date for PT Re-Evaluation 02/21/15   Authorization - Visit Number 3   Authorization - Number of Visits 10   PT Start Time 9379   PT Stop Time 1504   PT Time Calculation (min) 42 min   Equipment Utilized During Treatment Gait belt   Activity Tolerance Patient limited by pain;Patient tolerated treatment well;Patient limited by fatigue   Behavior During Therapy [facility] for tasks assessed/performed      Past Medical History  Diagnosis Date  . CHF (congestive heart failure)   . Diabetes mellitus without complication   . Hypertension     Past Surgical History  Procedure Laterality Date  . Pacemaker placement    . Leg surgery    . Abdominal hysterectomy    . Tonsillectomy    . Back surgery      x3    There were no vitals filed for this visit.  Visit Diagnosis:  Difficulty walking  Muscle weakness  Joint stiffness  Abnormal posture      Subjective Assessment - 02/20/15 1746    Subjective Pt reports LBP at 7/10. Pt states she is still "seeping" from her cut on her leg and will return to MD next week about cut. Pt reports being tired today due to a busy morning.    Limitations Walking;Standing;Lifting;House hold activities   Patient Stated Goals walk with SPC/get better/increase balance and strength/ decrease LBP   Currently in Pain? Yes   Pain Score 7    Pain Location Back   Pain Orientation Lower   Pain Descriptors / Indicators Aching   Pain Type Chronic pain   Pain Onset More than a month ago   Pain Frequency Constant      OBJECTIVE: There ex: In supine,  bridging/hip abduction with green TB x 20 each. Reviewed seated/standing HEP for proper technique. Gait: in // bars, SPC use with cuing for decreased R hip trendelenburg and increased L knee flexion with CGA. Ambulation overground with SPC and CGA (pt required cuing for wide base of support and fluid/reciprocal gait pattern). Pt ambulated with 2 SPC and CGA per pt request (pt didn't demonstrate R hip trendelenburg with 2 canes). Attempted to provide tactile feedback with mobilization belt to decrease R hip trendelenburg but unsuccessful. Neuro: standing posture and gait correction (mod. A for verbal/tactile cuing).   Pt response for medical necessity: Pt continues to benefit from strength, balance and gait training to increase her independence and safety with functional mobility.           PT Short Term Goals - 01/24/15 1237    PT SHORT TERM GOAL #1   Title --   Time --   Period --   Status --   PT SHORT TERM GOAL #2   Title --   Time --   Period --   Status --   PT SHORT TERM GOAL #3   Title --   Time --   Period --   Status --           PT Long Term Goals -  01/24/15 1440    PT LONG TERM GOAL #1   Title Pt will improve her BERG score to > 46/56 in order to promote safety/ decrease fall risk.    Baseline 41/56 on 8/2   Time 4   Period Weeks   Status New   PT LONG TERM GOAL #2   Title Pt will maintain tandem stance with no UE support for 30 seconds to improve standing activities in kitchen.    Time 4   Period Weeks   Status New   PT LONG TERM GOAL #3   Title Pt. I with HEP to increase B LE muscle strength 1/2 MMT to improve standing balance/ fucntional mobility.     Time 4   Period Weeks   Status New   PT LONG TERM GOAL #4   Title Pt. will demonstrate upright standing posture/ proper lifting technique to improve ADL.     Time 4   Period Weeks   Status New   PT LONG TERM GOAL #5   Title Pt. will ambulate with use of SPC in house with 2-point gait pattern and no LOB  safely.    Time 4   Period Weeks   Status New               Plan - 02/20/15 1748    Clinical Impression Statement Pt has fatigue throughout the session and requires frequent seated rest breaks. Between exercises, pt stands with a flexed posture and states that it feels better. Pt reports ambulating at home with flexed posture and not looking at where she is going. Pt is able to ambulate with SPC but continues to have R hip weakness with L swing phase. Pt is able to correct R hip trendelenburg with B UE support. Pt progressing with HEP to perform activities in standing.    Pt will benefit from skilled therapeutic intervention in order to improve on the following deficits Abnormal gait;Decreased activity tolerance;Decreased balance;Decreased range of motion;Difficulty walking;Pain;Hypomobility;Decreased strength;Postural dysfunction;Improper body mechanics;Impaired flexibility;Decreased safety awareness;Decreased endurance;Decreased coordination   Rehab Potential Good   PT Frequency 2x / week   PT Duration 4 weeks   PT Treatment/Interventions ADLs/Self Care Home Management;Cryotherapy;Moist Heat;Balance training;Therapeutic exercise;Manual techniques;Therapeutic activities;Functional mobility training;Stair training;Gait training;Patient/family education;Neuromuscular re-education   PT Next Visit Plan continue to work on gait in standing/progress hip strengthening.  RECERT NEXT VISIT    PT Home Exercise Plan see handout for new seated HEP   Consulted and Agree with Plan of Care Patient        Problem List Patient Active Problem List   Diagnosis Date Noted  . Pulmonary nodule 11/10/2014    Lavone Neri, SPT  02/21/2015, 7:54 AM  [place] [facility] [address] [place], [place], [address] Phone: [phone]   Fax:  [phone]

## 2015-02-22 ENCOUNTER — Encounter: Payer: Medicare PPO | Admitting: Physical Therapy

## 2015-02-24 NOTE — Progress Notes (Signed)
[place]  [phone] [phone]  ID: [name] OB: 04/19/1925  MR#: [id number]  XKG#:[id number]  Patient Care Team: Tracie Harrier, MD as PCP - General (Internal Medicine)  CHIEF COMPLAINT:  Chief Complaint  Patient presents with  . Follow-up    pulmonary nodules/discuss CT results    INTERVAL HISTORY: Patient returns to clinic today for further evaluation and discussion of her CT scan results. She currently feels well and is asymptomatic. She has no neurologic complaints. She denies any weight loss. She denies any recent fevers. She has no chest pain, shortness of breath, hemoptysis, or cough. She denies any nausea, vomiting, constipation, or diarrhea. She has no urinary complaints. Patient otherwise feels well and offers no further specific complaints.  REVIEW OF SYSTEMS:   Review of Systems  Constitutional: Negative for fever, chills and weight loss.  Respiratory: Negative.  Negative for shortness of breath.   Cardiovascular: Negative.     As per HPI. Otherwise, a complete review of systems is negatve.  PAST MEDICAL HISTORY: Past Medical History  Diagnosis Date  . CHF (congestive heart failure)   . Diabetes mellitus without complication   . Hypertension     PAST SURGICAL HISTORY: Past Surgical History  Procedure Laterality Date  . Pacemaker placement    . Leg surgery    . Abdominal hysterectomy    . Tonsillectomy    . Back surgery      x3    FAMILY HISTORY: Prostate cancer, rectal cancer, DVT, CAD, hypertension.     ADVANCED DIRECTIVES:    HEALTH MAINTENANCE: Social History  Substance Use Topics  . Smoking status: Never Smoker   . Smokeless tobacco: Not on file  . Alcohol Use: No     Colonoscopy:  PAP:  Bone density:  Lipid panel:  Allergies  Allergen Reactions  . Ceftriaxone Swelling  . Cefuroxime Hives  . Cephalosporins     Fainting   . Codeine Nausea Only  . Epinephrine     Fainting    . Morphine And Related Nausea Only  . Rocephin [Ceftriaxone Sodium In Dextrose] Swelling  . Buprenorphine Hcl Nausea Only  . Ciprofloxacin Itching, Rash, Swelling and Other (See Comments)    Itching and swelling  . Latex Rash  . Lidocaine Rash  . Procaine Rash and Other (See Comments)    Current Outpatient Prescriptions  Medication Sig Dispense Refill  . apixaban (ELIQUIS) 5 MG TABS tablet     . aspirin 81 MG tablet Take 81 mg by mouth daily.    . baclofen (LIORESAL) 10 MG tablet Take 5-10 mg by mouth.    . carvedilol (COREG) 6.25 MG tablet Take 6.25 mg by mouth daily.    Marland Kitchen docusate sodium (COLACE) 100 MG capsule Take 100 mg by mouth daily.    Marland Kitchen escitalopram (LEXAPRO) 10 MG tablet Take 10 mg by mouth daily.    . fentaNYL (DURAGESIC - DOSED MCG/HR) 50 MCG/HR Place 50 mcg onto the skin every 3 (three) days.    . furosemide (LASIX) 80 MG tablet     . glimepiride (AMARYL) 4 MG tablet Take 4 mg by mouth 2 (two) times daily after a meal.    . JANUVIA 100 MG tablet Take 100 mg by mouth daily.    Marland Kitchen losartan (COZAAR) 25 MG tablet Take 25 mg by mouth daily.    . Magnesium Oxide -Mg Supplement 400 MG CAPS Take 400 mg by mouth daily.    . Melatonin 1 MG  CAPS Take 1 mg by mouth daily.    . methocarbamol (ROBAXIN) 500 MG tablet     . metolazone (ZAROXOLYN) 2.5 MG tablet Take 2.5 mg by mouth daily.    . mupirocin ointment (BACTROBAN) 2 %     . oxycodone (OXY-IR) 5 MG capsule Take 5 mg by mouth every 4 (four) hours as needed.    . pantoprazole (PROTONIX) 40 MG tablet Take 40 mg by mouth daily.    . vitamin B-12 (CYANOCOBALAMIN) 100 MCG tablet Take 100 mcg by mouth daily.    . vitamin C (ASCORBIC ACID) 500 MG tablet Take 500 mg by mouth daily.    . Vitamin D, Ergocalciferol, (DRISDOL) 50000 UNITS CAPS capsule Take 50,000 Units by mouth daily.     No current facility-administered medications for this visit.    OBJECTIVE: Filed Vitals:   02/20/15 1128  BP: 124/65  Pulse: 68  Temp: 97.2 F  (36.2 C)  Resp: 16     There is no weight on file to calculate BMI.    ECOG FS:0 - Asymptomatic  General: Well-developed, well-nourished, no acute distress. Eyes: anicteric sclera. Lungs: Clear to auscultation bilaterally. Heart: Regular rate and rhythm. No rubs, murmurs, or gallops. Abdomen: Soft, nontender, nondistended. No organomegaly noted, normoactive bowel sounds. Musculoskeletal: No edema, cyanosis, or clubbing. Neuro: Alert, answering all questions appropriately. Cranial nerves grossly intact. Skin: No rashes or petechiae noted. Psych: Normal affect.    LAB RESULTS:  Lab Results  Component Value Date   NA 137 10/21/2014   K 4.4 10/21/2014   CL 97* 10/21/2014   CO2 31 10/21/2014   GLUCOSE 130* 10/21/2014   BUN 36* 10/21/2014   CREATININE 1.02* 10/21/2014   CALCIUM 8.9 10/21/2014   PROT 7.1 10/21/2014   ALBUMIN 4.0 10/21/2014   AST 17 10/21/2014   ALT 11* 10/21/2014   ALKPHOS 91 10/21/2014   BILITOT 0.8 10/21/2014   GFRNONAA 49* 10/21/2014   GFRAA 57* 10/21/2014    Lab Results  Component Value Date   WBC 7.1 10/21/2014   NEUTROABS 5.0 10/21/2014   HGB 11.2* 10/21/2014   HCT 34.5* 10/21/2014   MCV 92 10/21/2014   PLT 156 10/21/2014     STUDIES: Ct Chest W Contrast  02/16/2015   CLINICAL DATA:  81 year old with right lower lobe pulmonary nodule and chronic lymphadenopathy. No history of malignancy. Subsequent encounter.  EXAM: CT CHEST WITH CONTRAST  TECHNIQUE: Multidetector CT imaging of the chest was performed during intravenous contrast administration.  CONTRAST:  42mL OMNIPAQUE IOHEXOL 300 MG/ML  SOLN  COMPARISON:  PET-CT 11/11/2014.  Chest CT 01/14/2013 and 10/06/2014.  FINDINGS: Mediastinum/Nodes: Previously demonstrated mediastinal lymphadenopathy has improved. For example, there is a right paratracheal node measuring 9 mm on image 16 which previously measured 14 mm. Precarinal node measures 15 mm on image 22, previously 20 mm. There is a subcarinal  node measuring 14 mm short axis on image number 29, previously 20 mm. No enlarging lymph nodes identified. There is no axillary adenopathy. The thyroid gland, trachea and esophagus demonstrate no significant findings. The heart size is normal. There is no pericardial effusion. Patient has a pacemaker. There is extensive atherosclerosis of the aorta, great vessels and coronary arteries. There is chronic central enlargement of the pulmonary arteries.  Lungs/Pleura: There is no pleural effusion. Chronic lung disease is again noted with mosaic attenuation throughout the lungs and biapical scarring. 8 mm right lower lobe nodule on image 41 is unchanged. There is a new irregular 10  mm right lower lobe nodule on image 39. There is also new nodularity within the superior segment of the right lower lobe (images 20 and 22). Chronic subpleural nodule in the left lower lobe on image 40 is unchanged from 2014. There is a new 4 mm left lower lobe nodule on image 37 (better seen on the reformatted images) and new focal airspace disease in the superior segment of the left lower lobe measuring 1.9 cm on image 20.  Upper abdomen: Stable without acute findings. Probable hepatic steatosis. Reflux into the IVC and hepatic veins is not as well seen on the current study.  Musculoskeletal/Chest wall: There is no chest wall mass or suspicious osseous finding. Stable superior endplate compression deformity at T10 status post thoracolumbar fusion.  IMPRESSION: 1. Interval improvement in previously demonstrated mediastinal lymphadenopathy, not hypermetabolic on PET-CT. 2. The dominant right lower lobe nodule demonstrated on the recent prior studies has not significantly changed, although there are new ill-defined nodules at both lung bases. These are nonspecific and potentially inflammatory. Focal airspace opacity in the superior segment of the left lower lobe is likely inflammatory. Continued CT follow-up in 4-6 months recommended to help  exclude neoplasm. 3. Additional findings are stable, including diffuse atherosclerosis, central enlargement of the pulmonary arteries and mosaic attenuation of the lungs.   Electronically Signed   By: Richardean Sale M.D.   On: 02/16/2015 11:34    ASSESSMENT: Mediastinal lymphadenopathy and pulmonary nodule.  PLAN:    1. Mediastinal lymphadenopathy and new pulmonary nodule: CT scan results reviewed independently and reported as above. Mediastinal lymphadenopathy has resolved. Her right lower lobe pulmonary nodule has not significantly changed, although there are new "ill-defined" nodules at both lung bases. No intervention is needed at this time. Will repeat CT scan in 6 months for further evaluation and to assess for any interval change. If her CT scan remained essentially unchanged at that point, patient can possibly be discharged from clinic.  Patient expressed understanding and was in agreement with this plan. She also understands that She can call clinic at any time with any questions, concerns, or complaints.   Lloyd Huger, MD   02/24/2015 1:54 PM

## 2015-03-15 ENCOUNTER — Ambulatory Visit: Payer: Medicare PPO | Attending: Internal Medicine | Admitting: Physical Therapy

## 2015-03-15 VITALS — BP 124/45

## 2015-03-15 DIAGNOSIS — M2560 Stiffness of unspecified joint, not elsewhere classified: Secondary | ICD-10-CM

## 2015-03-15 DIAGNOSIS — M6281 Muscle weakness (generalized): Secondary | ICD-10-CM | POA: Insufficient documentation

## 2015-03-15 DIAGNOSIS — R293 Abnormal posture: Secondary | ICD-10-CM

## 2015-03-15 DIAGNOSIS — R262 Difficulty in walking, not elsewhere classified: Secondary | ICD-10-CM

## 2015-03-16 ENCOUNTER — Encounter: Payer: Self-pay | Admitting: Physical Therapy

## 2015-03-16 NOTE — Therapy (Signed)
[place] [facility] [address] [place], [place], [address] Phone: [phone]   Fax:  [phone]  Physical Therapy Treatment  Patient Details  [name] MRN: [id number] Date of Birth: [birth year] Referring Provider:  Tracie Harrier, MD  Encounter Date: 03/15/2015      PT End of Session - 03/16/15 1352    Visit Number 4   Number of Visits 10   Date for PT Re-Evaluation 04/13/15   Authorization - Visit Number 4   Authorization - Number of Visits 20   PT Start Time 1430   PT Stop Time 1505   PT Time Calculation (min) 35 min   Equipment Utilized During Treatment Gait belt   Activity Tolerance Patient tolerated treatment well;Patient limited by fatigue;Patient limited by pain;Treatment limited secondary to medical complications (Comment)  Pt's BP 126/44 both manually and with the machine   Behavior During Therapy [facility] for tasks assessed/performed      Past Medical History  Diagnosis Date  . CHF (congestive heart failure)   . Diabetes mellitus without complication   . Hypertension     Past Surgical History  Procedure Laterality Date  . Pacemaker placement    . Leg surgery    . Abdominal hysterectomy    . Tonsillectomy    . Back surgery      x3    There were no vitals filed for this visit.  Visit Diagnosis:  Difficulty walking  Muscle weakness  Joint stiffness  Abnormal posture      Subjective Assessment - 03/16/15 1344    Subjective Pt reports 8/10 LBP and finding no relief from anything. Pt reports not feeling well "and ready to give up." Pt states she is dizzy and woke up with blurry vision this morning. Pt reports leg still blending some from wound from car door.    Limitations Walking;Standing;Lifting;House hold activities   Patient Stated Goals walk with SPC/get better/increase balance and strength/ decrease LBP   Currently in Pain? Yes   Pain Score 8    Pain Location Back   Pain Orientation  Lower   Pain Descriptors / Indicators Constant   Pain Type Chronic pain   Pain Onset More than a month ago   Pain Frequency Constant         OBJECTIVE: BERG: 36/56 with multiple rest breaks. Pt limited in activity tolerance due to BP, 126/44, PT session ended due to this. Pt instructed to call MD and follow up with MD about BP. ODI: 56%, self-perceived severe disability.  There.ex.: limited today due to BP.  Reviewed HEP. Pt response for medical necessity: Pt continues to benefit from strength, balance and gait training to increase her independence and safety with functional mobility. Pt benefits from increasing activity tolerance to increase endurance and safety with mobility.          PT Short Term Goals - 01/24/15 1237    PT SHORT TERM GOAL #1   Title --   Time --   Period --   Status --   PT SHORT TERM GOAL #2   Title --   Time --   Period --   Status --   PT SHORT TERM GOAL #3   Title --   Time --   Period --   Status --           PT Long Term Goals - 03/16/15 1406    PT LONG TERM GOAL #1   Title Pt will  improve her BERG score to > 46/56 in order to promote safety/ decrease fall risk.    Baseline 36/56 on 9/21   Time 4   Period Weeks   Status New   PT LONG TERM GOAL #2   Title Pt will maintain tandem stance with no UE support for 30 seconds to improve standing activities in kitchen.    Time 4   Period Weeks   Status New   PT LONG TERM GOAL #3   Title Pt. I with HEP to increase B LE muscle strength 1/2 MMT to improve standing balance/ fucntional mobility.     Time 4   Period Weeks   Status New   PT LONG TERM GOAL #4   Title Pt. will demonstrate upright standing posture/ proper lifting technique to improve ADL.     Time 4   Period Weeks   Status New   PT LONG TERM GOAL #5   Title Pt. will ambulate with use of SPC in house with 2-point gait pattern and no LOB safely.    Time 4   Period Weeks   Status New            Plan -  03/16/15 1401    Clinical Impression Statement Pt reports pain at 8/10 in her lumbar spine which limits her standing activity tolerance. ODI: 56% self perceived severe disability. Pt is able to stand for 2 minutes before needing a seated rest break. Pt ambulates with increased trunk flexion, decreased step length and decrease hip flexion as compared to session on 8/29. Pt has increased L hip weakness as demonstrated with gait with stance phase on the L (+) trendelenberg.  Pt reports no falls since last PT tx session. Pt's endurance is decreased compared to previous sessions. Pt was able to complete the BERG balance with a score of 36/56 with multiple rest breaks. Pt has difficulty with any narrow base of support or activity without B UE support. Pt will benefit from short term skilled PT to increase strength, decrease fatigue and improve safety with functional mobility.   Pt will benefit from skilled therapeutic intervention in order to improve on the following deficits Abnormal gait;Decreased activity tolerance;Decreased balance;Decreased range of motion;Difficulty walking;Pain;Hypomobility;Decreased strength;Postural dysfunction;Improper body mechanics;Impaired flexibility;Decreased safety awareness;Decreased endurance;Decreased coordination   Rehab Potential Fair   Clinical Impairments Affecting Rehab Potential chronicity of back pain, co-morbidites, BP issues   PT Frequency 2x / week   PT Duration 4 weeks   PT Treatment/Interventions ADLs/Self Care Home Management;Cryotherapy;Moist Heat;Balance training;Therapeutic exercise;Manual techniques;Therapeutic activities;Functional mobility training;Stair training;Gait training;Patient/family education;Neuromuscular re-education   PT Next Visit Plan increase standing tolerance/gait with SPC(s)/functional activities to improve safety at home    PT Home Exercise Plan continue with exercises at home.    Consulted and Agree with Plan of Care Patient         Problem List Patient Active Problem List   Diagnosis Date Noted  . Pulmonary nodule 11/10/2014   Pura Spice, PT, DPT # [phone]   03/16/2015, 5:03 PM  [facility] [address] [place], [place], [address] Phone: [phone]   Fax:  [phone]

## 2015-03-20 ENCOUNTER — Emergency Department: Payer: Medicare PPO

## 2015-03-20 ENCOUNTER — Inpatient Hospital Stay
Admission: EM | Admit: 2015-03-20 | Discharge: 2015-03-22 | DRG: 292 | Disposition: A | Discharge status: Home / Self Care | Payer: Medicare PPO | Attending: Internal Medicine | Admitting: Internal Medicine

## 2015-03-20 ENCOUNTER — Encounter: Payer: Self-pay | Admitting: Emergency Medicine

## 2015-03-20 DIAGNOSIS — Z888 Allergy status to other drugs, medicaments and biological substances status: Secondary | ICD-10-CM

## 2015-03-20 DIAGNOSIS — I4891 Unspecified atrial fibrillation: Secondary | ICD-10-CM | POA: Diagnosis present

## 2015-03-20 DIAGNOSIS — N179 Acute kidney failure, unspecified: Secondary | ICD-10-CM | POA: Diagnosis present

## 2015-03-20 DIAGNOSIS — Z885 Allergy status to narcotic agent status: Secondary | ICD-10-CM

## 2015-03-20 DIAGNOSIS — Z886 Allergy status to analgesic agent status: Secondary | ICD-10-CM

## 2015-03-20 DIAGNOSIS — Z9889 Other specified postprocedural states: Secondary | ICD-10-CM

## 2015-03-20 DIAGNOSIS — Z955 Presence of coronary angioplasty implant and graft: Secondary | ICD-10-CM

## 2015-03-20 DIAGNOSIS — E119 Type 2 diabetes mellitus without complications: Secondary | ICD-10-CM | POA: Diagnosis present

## 2015-03-20 DIAGNOSIS — I5023 Acute on chronic systolic (congestive) heart failure: Principal | ICD-10-CM | POA: Diagnosis present

## 2015-03-20 DIAGNOSIS — Z90710 Acquired absence of both cervix and uterus: Secondary | ICD-10-CM

## 2015-03-20 DIAGNOSIS — Z79899 Other long term (current) drug therapy: Secondary | ICD-10-CM

## 2015-03-20 DIAGNOSIS — G8929 Other chronic pain: Secondary | ICD-10-CM | POA: Diagnosis present

## 2015-03-20 DIAGNOSIS — J810 Acute pulmonary edema: Secondary | ICD-10-CM | POA: Diagnosis not present

## 2015-03-20 DIAGNOSIS — I5043 Acute on chronic combined systolic (congestive) and diastolic (congestive) heart failure: Secondary | ICD-10-CM | POA: Diagnosis present

## 2015-03-20 DIAGNOSIS — I10 Essential (primary) hypertension: Secondary | ICD-10-CM | POA: Diagnosis present

## 2015-03-20 DIAGNOSIS — Z950 Presence of cardiac pacemaker: Secondary | ICD-10-CM

## 2015-03-20 DIAGNOSIS — R0902 Hypoxemia: Secondary | ICD-10-CM | POA: Diagnosis present

## 2015-03-20 DIAGNOSIS — M549 Dorsalgia, unspecified: Secondary | ICD-10-CM | POA: Diagnosis present

## 2015-03-20 DIAGNOSIS — Z7982 Long term (current) use of aspirin: Secondary | ICD-10-CM

## 2015-03-20 HISTORY — DX: Dorsalgia, unspecified: M54.9

## 2015-03-20 HISTORY — DX: Other chronic pain: G89.29

## 2015-03-20 LAB — CBC
HEMATOCRIT: 35.6 % (ref 35.0–47.0)
Hemoglobin: 11.6 g/dL — ABNORMAL LOW (ref 12.0–16.0)
MCH: 29.3 pg (ref 26.0–34.0)
MCHC: 32.5 g/dL (ref 32.0–36.0)
MCV: 90 fL (ref 80.0–100.0)
Platelets: 175 10*3/uL (ref 150–440)
RBC: 3.95 MIL/uL (ref 3.80–5.20)
RDW: 15.6 % — AB (ref 11.5–14.5)
WBC: 9.6 10*3/uL (ref 3.6–11.0)

## 2015-03-20 LAB — BASIC METABOLIC PANEL
ANION GAP: 8 (ref 5–15)
BUN: 30 mg/dL — AB (ref 6–20)
CALCIUM: 8.7 mg/dL — AB (ref 8.9–10.3)
CO2: 28 mmol/L (ref 22–32)
Chloride: 100 mmol/L — ABNORMAL LOW (ref 101–111)
Creatinine, Ser: 1.2 mg/dL — ABNORMAL HIGH (ref 0.44–1.00)
GFR calc Af Amer: 46 mL/min — ABNORMAL LOW (ref >60)
GFR, EST NON AFRICAN AMERICAN: 39 mL/min — AB (ref >60)
Glucose, Bld: 166 mg/dL — ABNORMAL HIGH (ref 65–99)
POTASSIUM: 4.1 mmol/L (ref 3.5–5.1)
SODIUM: 136 mmol/L (ref 135–145)

## 2015-03-20 LAB — TROPONIN I

## 2015-03-20 LAB — PROTIME-INR
INR: 1.52
PROTHROMBIN TIME: 18.5 s — AB (ref 11.4–15.0)

## 2015-03-20 NOTE — ED Notes (Signed)
Patient presents to Emergency Department via EMS with complaints of generalized weakness and back pain radiating to ribcage after "over doing it" while working, pain started around 5pm.  Pt has pacermaker and hx of 2 x stent placement, eloquis and daily 81mg  ASA.  Pt is vent paced with wide QRS.

## 2015-03-20 NOTE — ED Notes (Signed)
CBG 162 

## 2015-03-21 ENCOUNTER — Encounter: Payer: Self-pay | Admitting: Internal Medicine

## 2015-03-21 ENCOUNTER — Encounter: Payer: Medicare PPO | Admitting: Physical Therapy

## 2015-03-21 ENCOUNTER — Inpatient Hospital Stay: Payer: Medicare PPO

## 2015-03-21 DIAGNOSIS — M549 Dorsalgia, unspecified: Secondary | ICD-10-CM | POA: Diagnosis present

## 2015-03-21 DIAGNOSIS — Z955 Presence of coronary angioplasty implant and graft: Secondary | ICD-10-CM | POA: Diagnosis not present

## 2015-03-21 DIAGNOSIS — Z90710 Acquired absence of both cervix and uterus: Secondary | ICD-10-CM | POA: Diagnosis not present

## 2015-03-21 DIAGNOSIS — E119 Type 2 diabetes mellitus without complications: Secondary | ICD-10-CM | POA: Diagnosis present

## 2015-03-21 DIAGNOSIS — I5023 Acute on chronic systolic (congestive) heart failure: Secondary | ICD-10-CM | POA: Diagnosis present

## 2015-03-21 DIAGNOSIS — R0902 Hypoxemia: Secondary | ICD-10-CM | POA: Diagnosis present

## 2015-03-21 DIAGNOSIS — N179 Acute kidney failure, unspecified: Secondary | ICD-10-CM | POA: Diagnosis present

## 2015-03-21 DIAGNOSIS — J810 Acute pulmonary edema: Secondary | ICD-10-CM | POA: Diagnosis present

## 2015-03-21 DIAGNOSIS — Z9889 Other specified postprocedural states: Secondary | ICD-10-CM | POA: Diagnosis not present

## 2015-03-21 DIAGNOSIS — I4891 Unspecified atrial fibrillation: Secondary | ICD-10-CM | POA: Diagnosis present

## 2015-03-21 DIAGNOSIS — Z7982 Long term (current) use of aspirin: Secondary | ICD-10-CM | POA: Diagnosis not present

## 2015-03-21 DIAGNOSIS — Z885 Allergy status to narcotic agent status: Secondary | ICD-10-CM | POA: Diagnosis not present

## 2015-03-21 DIAGNOSIS — I10 Essential (primary) hypertension: Secondary | ICD-10-CM | POA: Diagnosis present

## 2015-03-21 DIAGNOSIS — Z950 Presence of cardiac pacemaker: Secondary | ICD-10-CM | POA: Diagnosis not present

## 2015-03-21 DIAGNOSIS — I5043 Acute on chronic combined systolic (congestive) and diastolic (congestive) heart failure: Secondary | ICD-10-CM | POA: Diagnosis present

## 2015-03-21 DIAGNOSIS — G8929 Other chronic pain: Secondary | ICD-10-CM | POA: Diagnosis present

## 2015-03-21 DIAGNOSIS — Z888 Allergy status to other drugs, medicaments and biological substances status: Secondary | ICD-10-CM | POA: Diagnosis not present

## 2015-03-21 DIAGNOSIS — Z79899 Other long term (current) drug therapy: Secondary | ICD-10-CM | POA: Diagnosis not present

## 2015-03-21 DIAGNOSIS — Z886 Allergy status to analgesic agent status: Secondary | ICD-10-CM | POA: Diagnosis not present

## 2015-03-21 LAB — HEMOGLOBIN A1C: Hgb A1c MFr Bld: 7 % — ABNORMAL HIGH (ref 4.0–6.0)

## 2015-03-21 LAB — GLUCOSE, CAPILLARY
GLUCOSE-CAPILLARY: 162 mg/dL — AB (ref 65–99)
Glucose-Capillary: 130 mg/dL — ABNORMAL HIGH (ref 65–99)
Glucose-Capillary: 124 mg/dL — ABNORMAL HIGH (ref 65–99)

## 2015-03-21 MED ORDER — ASPIRIN EC 81 MG PO TBEC
81.0000 mg | DELAYED_RELEASE_TABLET | Freq: Every day | ORAL | Status: DC
  Administered 2015-03-21 – 2015-03-22 (×2): 81 mg via ORAL
  Filled 2015-03-21 (×3): qty 1

## 2015-03-21 MED ORDER — MORPHINE SULFATE (PF) 2 MG/ML IV SOLN
INTRAVENOUS | Status: AC
  Filled 2015-03-21: qty 1

## 2015-03-21 MED ORDER — OXYCODONE HCL 5 MG PO TABS
10.0000 mg | ORAL_TABLET | Freq: Once | ORAL | Status: AC
  Administered 2015-03-21: 10 mg via ORAL
  Filled 2015-03-21: qty 2

## 2015-03-21 MED ORDER — FENTANYL CITRATE (PF) 100 MCG/2ML IJ SOLN: 25.0000 ug | Freq: Once | INTRAMUSCULAR | Status: DC

## 2015-03-21 MED ORDER — LOSARTAN POTASSIUM 25 MG PO TABS
25.0000 mg | ORAL_TABLET | Freq: Every day | ORAL | Status: DC
  Administered 2015-03-21 – 2015-03-22 (×2): 25 mg via ORAL
  Filled 2015-03-21 (×2): qty 1

## 2015-03-21 MED ORDER — CARVEDILOL 6.25 MG PO TABS
6.2500 mg | ORAL_TABLET | Freq: Two times a day (BID) | ORAL | Status: DC
  Administered 2015-03-21 – 2015-03-22 (×3): 6.25 mg via ORAL
  Filled 2015-03-21 (×3): qty 1

## 2015-03-21 MED ORDER — LINAGLIPTIN 5 MG PO TABS
5.0000 mg | ORAL_TABLET | Freq: Every day | ORAL | Status: DC
  Administered 2015-03-21: 5 mg via ORAL
  Filled 2015-03-21: qty 1

## 2015-03-21 MED ORDER — VITAMIN B-12 100 MCG PO TABS
100.0000 ug | ORAL_TABLET | Freq: Every day | ORAL | Status: DC
Start: 2015-03-21 — End: 2015-03-22
  Administered 2015-03-22: 100 ug via ORAL
  Filled 2015-03-21 (×2): qty 1

## 2015-03-21 MED ORDER — FERROUS SULFATE 325 (65 FE) MG PO TABS
325.0000 mg | ORAL_TABLET | Freq: Every day | ORAL | Status: DC
  Administered 2015-03-21 – 2015-03-22 (×2): 325 mg via ORAL
  Filled 2015-03-21 (×2): qty 1

## 2015-03-21 MED ORDER — INSULIN ASPART 100 UNIT/ML ~~LOC~~ SOLN
0.0000 [IU] | Freq: Three times a day (TID) | SUBCUTANEOUS | Status: DC
  Administered 2015-03-22: 3 [IU] via SUBCUTANEOUS
  Filled 2015-03-21: qty 3

## 2015-03-21 MED ORDER — OXYCODONE HCL 5 MG PO TABS
5.0000 mg | ORAL_TABLET | ORAL | Status: DC
  Administered 2015-03-21: 5 mg via ORAL
  Filled 2015-03-21: qty 1

## 2015-03-21 MED ORDER — MAGNESIUM OXIDE 400 (241.3 MG) MG PO TABS
400.0000 mg | ORAL_TABLET | Freq: Every day | ORAL | Status: DC
  Administered 2015-03-21 – 2015-03-22 (×2): 400 mg via ORAL
  Filled 2015-03-21 (×2): qty 1

## 2015-03-21 MED ORDER — FUROSEMIDE 80 MG PO TABS
80.0000 mg | ORAL_TABLET | Freq: Every day | ORAL | Status: DC
  Administered 2015-03-21 – 2015-03-22 (×2): 80 mg via ORAL
  Filled 2015-03-21 (×2): qty 1

## 2015-03-21 MED ORDER — APIXABAN 5 MG PO TABS
5.0000 mg | ORAL_TABLET | Freq: Two times a day (BID) | ORAL | Status: DC
  Administered 2015-03-21 – 2015-03-22 (×3): 5 mg via ORAL
  Filled 2015-03-21 (×3): qty 1

## 2015-03-21 MED ORDER — FUROSEMIDE 10 MG/ML IJ SOLN
40.0000 mg | Freq: Once | INTRAMUSCULAR | Status: AC
  Administered 2015-03-21: 40 mg via INTRAVENOUS
  Filled 2015-03-21: qty 4

## 2015-03-21 MED ORDER — MELATONIN 1 MG PO CAPS: 1.0000 mg | ORAL_CAPSULE | Freq: Every day | ORAL | Status: DC

## 2015-03-21 MED ORDER — VITAMIN C 500 MG PO TABS
500.0000 mg | ORAL_TABLET | Freq: Every day | ORAL | Status: DC
  Administered 2015-03-21 – 2015-03-22 (×2): 500 mg via ORAL
  Filled 2015-03-21 (×2): qty 1

## 2015-03-21 MED ORDER — FENTANYL 25 MCG/HR TD PT72: 50.0000 ug | MEDICATED_PATCH | TRANSDERMAL | Status: DC

## 2015-03-21 MED ORDER — VITAMIN D (ERGOCALCIFEROL) 1.25 MG (50000 UNIT) PO CAPS: 50000.0000 [IU] | ORAL_CAPSULE | ORAL | Status: DC

## 2015-03-21 MED ORDER — DOCUSATE SODIUM 100 MG PO CAPS
100.0000 mg | ORAL_CAPSULE | Freq: Every day | ORAL | Status: DC
  Administered 2015-03-21 – 2015-03-22 (×2): 100 mg via ORAL
  Filled 2015-03-21 (×2): qty 1

## 2015-03-21 MED ORDER — FENTANYL 25 MCG/HR TD PT72
25.0000 ug | MEDICATED_PATCH | TRANSDERMAL | Status: DC
  Administered 2015-03-21: 25 ug via TRANSDERMAL
  Filled 2015-03-21: qty 1

## 2015-03-21 MED ORDER — ROPINIROLE HCL 1 MG PO TABS
0.5000 mg | ORAL_TABLET | Freq: Two times a day (BID) | ORAL | Status: DC
  Administered 2015-03-21 – 2015-03-22 (×2): 0.5 mg via ORAL
  Filled 2015-03-21 (×3): qty 1

## 2015-03-21 MED ORDER — METOLAZONE 2.5 MG PO TABS
2.5000 mg | ORAL_TABLET | ORAL | Status: DC
  Filled 2015-03-21: qty 1

## 2015-03-21 MED ORDER — BACLOFEN 10 MG PO TABS
10.0000 mg | ORAL_TABLET | Freq: Two times a day (BID) | ORAL | Status: DC
  Administered 2015-03-21: 10 mg via ORAL
  Filled 2015-03-21 (×2): qty 1

## 2015-03-21 MED ORDER — ONDANSETRON HCL 4 MG/2ML IJ SOLN
INTRAMUSCULAR | Status: AC
  Filled 2015-03-21: qty 2

## 2015-03-21 MED ORDER — SPIRONOLACTONE 25 MG PO TABS
25.0000 mg | ORAL_TABLET | Freq: Every day | ORAL | Status: DC
  Administered 2015-03-21 – 2015-03-22 (×2): 25 mg via ORAL
  Filled 2015-03-21 (×2): qty 1

## 2015-03-21 MED ORDER — PANTOPRAZOLE SODIUM 40 MG PO TBEC
40.0000 mg | DELAYED_RELEASE_TABLET | Freq: Every day | ORAL | Status: DC
  Administered 2015-03-21 – 2015-03-22 (×2): 40 mg via ORAL
  Filled 2015-03-21 (×2): qty 1

## 2015-03-21 MED ORDER — ESCITALOPRAM OXALATE 10 MG PO TABS
10.0000 mg | ORAL_TABLET | Freq: Every day | ORAL | Status: DC
Start: 2015-03-21 — End: 2015-03-22
  Administered 2015-03-21 – 2015-03-22 (×2): 10 mg via ORAL
  Filled 2015-03-21 (×2): qty 1

## 2015-03-21 NOTE — ED Provider Notes (Signed)
[facility] Emergency Department Provider Note  ____________________________________________  Time seen: 12:30 AM  I have reviewed the triage vital signs and the nursing notes.   HISTORY  Chief Complaint Weakness     HPI [name] is a 81 y.o. female presents with generalized weakness, dyspnea and mid back pain that radiates "to her rib cage with onset at 5 PM [name] today evening. Patient has a history of cardiac disease with a pacemaker and 2 cardiac stents placed currently taking Eliquis and aspirin. Patient denies any chest pain at present. Patient noted to be hypoxic on presentation to room O2 sat 89% on room air. Patient states that she's had recent changes in her medications by Dr. Renae Gloss day. She states that she currently takes 40 mg of Lasix on one day and 80 mg a next alternating    Past Medical History  Diagnosis Date  . CHF (congestive heart failure)   . Diabetes mellitus without complication   . Hypertension     Patient Active Problem List   Diagnosis Date Noted  . Acute on chronic systolic CHF (congestive heart failure) 03/21/2015  . Pulmonary nodule 11/10/2014    Past Surgical History  Procedure Laterality Date  . Pacemaker placement    . Leg surgery    . Abdominal hysterectomy    . Tonsillectomy    . Back surgery      x3  . Coronary stent placement      unknown location per pt    Current Outpatient Rx  Name  Route  Sig  Dispense  Refill  . apixaban (ELIQUIS) 5 MG TABS tablet   Oral   Take 5 mg by mouth 2 (two) times daily.          Marland Kitchen aspirin 81 MG tablet   Oral   Take 81 mg by mouth daily.         . baclofen (LIORESAL) 10 MG tablet   Oral   Take 10 mg by mouth 2 (two) times daily.          . carvedilol (COREG) 6.25 MG tablet   Oral   Take 6.25 mg by mouth 2 (two) times daily with a meal.          . docusate sodium (COLACE) 100 MG capsule   Oral   Take 100 mg by mouth daily.         Marland Kitchen  escitalopram (LEXAPRO) 10 MG tablet   Oral   Take 10 mg by mouth daily.         . fentaNYL (DURAGESIC - DOSED MCG/HR) 50 MCG/HR   Transdermal   Place 50 mcg onto the skin every 3 (three) days.         . ferrous sulfate 325 (65 FE) MG tablet   Oral   Take 325 mg by mouth daily with breakfast.         . furosemide (LASIX) 80 MG tablet   Oral   Take 80 mg by mouth daily.          Marland Kitchen glimepiride (AMARYL) 4 MG tablet   Oral   Take 4 mg by mouth 2 (two) times daily after a meal.         . JANUVIA 100 MG tablet   Oral   Take 100 mg by mouth daily.           Dispense as written.   Marland Kitchen losartan (COZAAR) 25 MG tablet   Oral  Take 25 mg by mouth daily.         . Magnesium Oxide -Mg Supplement 400 MG CAPS   Oral   Take 400 mg by mouth daily.         . Melatonin 1 MG CAPS   Oral   Take 1 mg by mouth at bedtime.          . metolazone (ZAROXOLYN) 2.5 MG tablet   Oral   Take 2.5 mg by mouth 2 (two) times a week. Takes on Monday and Friday         . oxycodone (OXY-IR) 5 MG capsule   Oral   Take 5-10 mg by mouth See admin instructions. 5mg  in the morning and mid-afternoon and 10mg  at bedtime         . pantoprazole (PROTONIX) 40 MG tablet   Oral   Take 40 mg by mouth daily.         Marland Kitchen rOPINIRole (REQUIP) 0.5 MG tablet   Oral   Take 0.5 mg by mouth 2 (two) times daily.         Marland Kitchen spironolactone (ALDACTONE) 25 MG tablet   Oral   Take 1 tablet by mouth daily.         . vitamin B-12 (CYANOCOBALAMIN) 100 MCG tablet   Oral   Take 100 mcg by mouth daily.         . vitamin C (ASCORBIC ACID) 500 MG tablet   Oral   Take 500 mg by mouth daily.         . Vitamin D, Ergocalciferol, (DRISDOL) 50000 UNITS CAPS capsule   Oral   Take 50,000 Units by mouth every 7 (seven) days.            Allergies Ceftriaxone; Cefuroxime; Cephalosporins; Codeine; Epinephrine; Morphine and related; Rocephin; Buprenorphine hcl; Ciprofloxacin; Latex; Lidocaine; and  Procaine  History reviewed. No pertinent family history.  Social History Social History  Substance Use Topics  . Smoking status: Never Smoker   . Smokeless tobacco: None  . Alcohol Use: No    Review of Systems  Constitutional: Negative for fever. Eyes: Negative for visual changes. ENT: Negative for sore throat. Cardiovascular: Negative for chest pain. Respiratory: Positive for shortness of breath. Gastrointestinal: Negative for abdominal pain, vomiting and diarrhea. Genitourinary: Negative for dysuria. Musculoskeletal: Negative for back pain. Skin: Negative for rash. Neurological: Negative for headaches, focal weakness or numbness.  10-point ROS otherwise negative.  ____________________________________________   PHYSICAL EXAM:  VITAL SIGNS: ED Triage Vitals  Enc Vitals Group     BP 03/20/15 2308 136/67 mmHg     Pulse Rate 03/20/15 2308 71     Resp 03/20/15 2308 19     Temp 03/20/15 2308 97.9 F (36.6 C)     Temp Source 03/20/15 2308 Oral     SpO2 03/20/15 2303 94 %     Weight 03/20/15 2308 172 lb (78.019 kg)     Height 03/20/15 2308 5\' 7"  (1.702 m)     Head Cir --      Peak Flow --      Pain Score 03/20/15 2309 10     Pain Loc --      Pain Edu? --      Excl. in [place]? --      Constitutional: Alert and oriented. Well appearing and in no distress. Eyes: Conjunctivae are normal. PERRL. Normal extraocular movements. ENT   Head: Normocephalic and atraumatic.   Nose: No congestion/rhinnorhea.   Mouth/Throat:  Mucous membranes are moist.   Neck: No stridor. Cardiovascular: Normal rate, regular rhythm. Normal and symmetric distal pulses are present in all extremities. No murmurs, rubs, or gallops. Respiratory: Normal respiratory effort without tachypnea nor retractions. Breath sounds are clear and equal bilaterally. Mild bibasilar rales Gastrointestinal: Soft and nontender. No distention. There is no CVA tenderness. Genitourinary:  deferred Musculoskeletal: Nontender with normal range of motion in all extremities. No joint effusions.  No lower extremity tenderness nor edema. Neurologic:  Normal speech and language. No gross focal neurologic deficits are appreciated. Speech is normal.  Skin:  Skin is warm, dry and intact. No rash noted. Psychiatric: Mood and affect are normal. Speech and behavior are normal. Patient exhibits appropriate insight and judgment.  ____________________________________________    LABS (pertinent positives/negatives)  Labs Reviewed  BASIC METABOLIC PANEL - Abnormal; Notable for the following:    Chloride 100 (*)    Glucose, Bld 166 (*)    BUN 30 (*)    Creatinine, Ser 1.20 (*)    Calcium 8.7 (*)    GFR calc non Af Amer 39 (*)    GFR calc Af Amer 46 (*)    All other components within normal limits  CBC - Abnormal; Notable for the following:    Hemoglobin 11.6 (*)    RDW 15.6 (*)    All other components within normal limits  PROTIME-INR - Abnormal; Notable for the following:    Prothrombin Time 18.5 (*)    All other components within normal limits  GLUCOSE, CAPILLARY - Abnormal; Notable for the following:    Glucose-Capillary 162 (*)    All other components within normal limits  TROPONIN I     ____________________________________________   EKG  ED ECG REPORT I, BROWN, Winf[place] N, the attending physician, personally viewed and interpreted this ECG.   Date: 03/21/2015  EKG Time: 6:19AM  Rate: 74  Rhythm: Ventricular paced rhythm  Axis: None  Intervals: Normal  ST&T Change: None   ____________________________________________    RADIOLOGY   DG Chest 2 View (Final result) Result time: 03/20/15 23:41:45   Final result by Rad Results In Interface (03/20/15 23:41:45)   Narrative:   CLINICAL DATA: Weakness  EXAM: CHEST 2 VIEW  COMPARISON: 02/16/2015 chest CT  FINDINGS: Chronic cardiomegaly. Dual-chamber pacer/ICD leads from the left are in unremarkable  position.  Diffuse interstitial coarsening with [employer]. Hyperinflation. No consolidation, effusion, or air leak.  Lower thoracic to lumbar posterior fixation with chronic compression fracture at the level of the upper screws, status post vertebroplasty. No acute osseous finding.  IMPRESSION: Mild CHF.   Electronically Signed By: Monte Fantasia M.D. On: 03/20/2015 23:41        ECG Results       INITIAL IMPRESSION / ASSESSMENT AND PLAN / ED COURSE  Pertinent labs & imaging results that were available during my care of the patient were reviewed by me and considered in my medical decision making (see chart for details).  History of physical exam consistent with acute on chronic CHF exacerbation. Regarding the patient's midthoracic back pain suspect that is secondary to stress or activity which was done yesterday. Patient discussed with Dr. Marcille Blanco for hospital admission for further evaluation and treatment.  ____________________________________________   FINAL CLINICAL IMPRESSION(S) / ED DIAGNOSES  Final diagnoses:  Acute pulmonary edema      Gregor Hams, MD 03/21/15 [phone]

## 2015-03-21 NOTE — Progress Notes (Signed)
[place] [facility] PROGRESS NOTE Date of Admission:  03/20/2015     ID: [name] is a 81 y.o. female with a history of cardiac disease with a pacemaker and 2 cardiac stents placed currently taking Eliquis and aspirin. Came to ED with dyspnea, weakness and back pain.  Patient noted to be hypoxic on presentation to room O2 sat 89% on room air. Patient states that she's had recent changes in her medications by Dr. Renae Gloss . She states that she currently takes 40 mg of Lasix on one day and 80 mg a next alternating Active Problems:   Acute on chronic systolic CHF (congestive heart failure)  Subjective: Continues to have back pain, mid back. Thinks she overdid herself.  Fentanyl patch removed. Pain is a little better today On 2L O2, does not use at home  ROS  Eleven systems are reviewed and negative except per hpi  Medications:  Antibiotics Given (last 72 hours)    None     . apixaban  5 mg Oral BID  . aspirin EC  81 mg Oral Daily  . baclofen  10 mg Oral BID  . carvedilol  6.25 mg Oral BID WC  . docusate sodium  100 mg Oral Daily  . escitalopram  10 mg Oral Daily  . fentaNYL (SUBLIMAZE) injection  25 mcg Intravenous Once  . ferrous sulfate  325 mg Oral Q breakfast  . furosemide  80 mg Oral Daily  . linagliptin  5 mg Oral Daily  . losartan  25 mg Oral Daily  . magnesium oxide  400 mg Oral Daily  . [START ON 03/23/2015] metolazone  2.5 mg Oral Once per day on Mon Thu  . ondansetron      . oxyCODONE  5-10 mg Oral See admin instructions  . pantoprazole  40 mg Oral QAC breakfast  . rOPINIRole  0.5 mg Oral BID  . spironolactone  25 mg Oral Daily  . vitamin B-12  100 mcg Oral Daily  . vitamin C  500 mg Oral Daily  . [START ON 03/24/2015] Vitamin D (Ergocalciferol)  50,000 Units Oral Q7 days    Objective: Vital signs in last 24 hours: Temp:  [97.5 F (36.4 C)-98.8 F (37.1 C)] 98.8 F (37.1 C) (09/27 0603) Pulse Rate:  [70-77] 72 (09/27 0603) Resp:  [14-20] 19  (09/27 0603) BP: (93-136)/(49-76) 130/56 mmHg (09/27 0603) SpO2:  [92 %-100 %] 96 % (09/27 0509) Weight:  [75.5 kg (166 lb 7.2 oz)-78.019 kg (172 lb)] 75.5 kg (166 lb 7.2 oz) (09/27 0636) Physical Exam  Constitutional:  oriented to person, place, and time. appears thin, kyphotic HENT: [place]/AT, PERRLA, no scleral icterus Mouth/Throat: Oropharynx is clear and moist. No oropharyngeal exudate.  Cardiovascular: Normal rate, regular rhythm and normal heart sounds Pulmonary/Chest: bibasilar crackles Neck = supple, no nuchal rigidity Abdominal: Soft. Bowel sounds are normal.  exhibits no distension. There is no tenderness.  Lymphadenopathy: no cervical adenopathy. No axillary adenopathy Neurological: alert and oriented to person, place, and time.  Skin: Skin is warm and dry. No rash noted. No erythema.  Psychiatric: a normal mood and affect.  behavior is normal.  Back ttp T spine  Lab Results  Recent Labs  03/20/15 2321  WBC 9.6  HGB 11.6*  HCT 35.6  NA 136  K 4.1  CL 100*  CO2 28  BUN 30*  CREATININE 1.20*    Studies/Results: Dg Chest 2 View  03/20/2015   CLINICAL DATA:  Weakness  EXAM: CHEST  2 VIEW  COMPARISON:  02/16/2015 chest CT  FINDINGS: Chronic cardiomegaly. Dual-chamber pacer/ICD leads from the left are in unremarkable position.  Diffuse interstitial coarsening with [employer]. Hyperinflation. No consolidation, effusion, or air leak.  Lower thoracic to lumbar posterior fixation with chronic compression fracture at the level of the upper screws, status post vertebroplasty. No acute osseous finding.  IMPRESSION: Mild CHF.   Electronically Signed   By: Monte Fantasia M.D.   On: 03/20/2015 23:41    Assessment/Plan: Acute CHF- given IV lasix x1, resume oral lasix and zaroxyln and spirinolactone.  Monitor BUN/cr.   Back pain - s/p 2 back surgeries, CT august 2016 showed stable sup endplate compression. - check T spine xray - stop fentanyl 25 mcg - I suspect some overdosign  of her pain meds  - careful management given some lethargy.   CAD/htn - cycle enzymes Cont coreg, losartan, asa and diuretics as above  DM- A1c 7.0  Debility - ? Due to over use of pain meds  Will get PT assessmnet   0FITZGERALD, DAVID   03/21/2015, 8:40 AM

## 2015-03-21 NOTE — Progress Notes (Signed)
Admission skin assessment done with Barbra Sarks., RN

## 2015-03-21 NOTE — H&P (Signed)
[name] is an 81 y.o. female.   Chief Complaint: Weakness HPI: The patient presents to the emergency department complaining of shortness of breath and chest pain radiating through to her back.  She had been moving summer clothes to storage all day-- lifting small loads out of the closet and replacing them with winter clothes. PMH is significant for chronic back pain and well as CHF.  She states that she has become progressively more short of breath over one day.    In the ED the patient required supplemental O2 and had a CXR that showed interstitial edema and vascular congestion.  Lasix 40 mg iv resulted in minor improvement in respiratory status which prompted the emergency department staff to call for admission.  Past Medical History  Diagnosis Date  . CHF (congestive heart failure)   . Diabetes mellitus without complication   . Hypertension     Past Surgical History  Procedure Laterality Date  . Pacemaker placement    . Leg surgery    . Abdominal hysterectomy    . Tonsillectomy    . Back surgery      x3  . Coronary stent placement      unknown location per pt    History reviewed. No pertinent family history. None Social History:  reports that she has never smoked. She does not have any smokeless tobacco history on file. She reports that she does not drink alcohol. Her drug history is not on file.  Allergies:  Allergies  Allergen Reactions  . Ceftriaxone Swelling  . Cefuroxime Hives  . Cephalosporins     Fainting   . Codeine Nausea Only  . Epinephrine     Fainting   . Morphine And Related Nausea Only  . Rocephin [Ceftriaxone Sodium In Dextrose] Swelling  . Buprenorphine Hcl Nausea Only  . Ciprofloxacin Itching, Rash, Swelling and Other (See Comments)    Itching and swelling  . Latex Rash  . Lidocaine Rash  . Procaine Rash and Other (See Comments)    Prior to Admission medications   Medication Sig Start Date End Date Taking? Authorizing Provider  apixaban  (ELIQUIS) 5 MG TABS tablet Take 5 mg by mouth 2 (two) times daily.  12/12/14  Yes Historical Provider, MD  aspirin 81 MG tablet Take 81 mg by mouth daily.   Yes Historical Provider, MD  baclofen (LIORESAL) 10 MG tablet Take 10 mg by mouth 2 (two) times daily.  12/08/14  Yes Historical Provider, MD  carvedilol (COREG) 6.25 MG tablet Take 6.25 mg by mouth 2 (two) times daily with a meal.    Yes Historical Provider, MD  docusate sodium (COLACE) 100 MG capsule Take 100 mg by mouth daily.   Yes Historical Provider, MD  escitalopram (LEXAPRO) 10 MG tablet Take 10 mg by mouth daily.   Yes Historical Provider, MD  fentaNYL (DURAGESIC - DOSED MCG/HR) 50 MCG/HR Place 50 mcg onto the skin every 3 (three) days.   Yes Historical Provider, MD  ferrous sulfate 325 (65 FE) MG tablet Take 325 mg by mouth daily with breakfast.   Yes Historical Provider, MD  furosemide (LASIX) 80 MG tablet Take 80 mg by mouth daily.  02/14/15  Yes Historical Provider, MD  glimepiride (AMARYL) 4 MG tablet Take 4 mg by mouth 2 (two) times daily after a meal.   Yes Historical Provider, MD  JANUVIA 100 MG tablet Take 100 mg by mouth daily. 10/27/14  Yes Historical Provider, MD  losartan (COZAAR) 25 MG tablet  Take 25 mg by mouth daily.   Yes Historical Provider, MD  Magnesium Oxide -Mg Supplement 400 MG CAPS Take 400 mg by mouth daily.   Yes Historical Provider, MD  Melatonin 1 MG CAPS Take 1 mg by mouth at bedtime.    Yes Historical Provider, MD  metolazone (ZAROXOLYN) 2.5 MG tablet Take 2.5 mg by mouth 2 (two) times a week. Takes on Monday and Friday   Yes Historical Provider, MD  oxycodone (OXY-IR) 5 MG capsule Take 5-10 mg by mouth See admin instructions. 11m in the morning and mid-afternoon and 120mat bedtime   Yes Historical Provider, MD  pantoprazole (PROTONIX) 40 MG tablet Take 40 mg by mouth daily.   Yes Historical Provider, MD  rOPINIRole (REQUIP) 0.5 MG tablet Take 0.5 mg by mouth 2 (two) times daily.   Yes Historical Provider, MD   spironolactone (ALDACTONE) 25 MG tablet Take 1 tablet by mouth daily.   Yes Historical Provider, MD  vitamin B-12 (CYANOCOBALAMIN) 100 MCG tablet Take 100 mcg by mouth daily.   Yes Historical Provider, MD  vitamin C (ASCORBIC ACID) 500 MG tablet Take 500 mg by mouth daily.   Yes Historical Provider, MD  Vitamin D, Ergocalciferol, (DRISDOL) 50000 UNITS CAPS capsule Take 50,000 Units by mouth every 7 (seven) days.    Yes Historical Provider, MD     Results for orders placed or performed during the hospital encounter of 03/20/15 (from the past 48 hour(s))  Basic metabolic panel     Status: Abnormal   Collection Time: 03/20/15 11:21 PM  Result Value Ref Range   Sodium 136 135 - 145 mmol/L   Potassium 4.1 3.5 - 5.1 mmol/L    Comment: HEMOLYSIS AT THIS LEVEL MAY AFFECT RESULT   Chloride 100 (L) 101 - 111 mmol/L   CO2 28 22 - 32 mmol/L   Glucose, Bld 166 (H) 65 - 99 mg/dL   BUN 30 (H) 6 - 20 mg/dL   Creatinine, Ser 1.20 (H) 0.44 - 1.00 mg/dL   Calcium 8.7 (L) 8.9 - 10.3 mg/dL   GFR calc non Af Amer 39 (L) >60 mL/min   GFR calc Af Amer 46 (L) >60 mL/min    Comment: (NOTE) The eGFR has been calculated using the CKD EPI equation. This calculation has not been validated in all clinical situations. eGFR's persistently <60 mL/min signify possible Chronic Kidney Disease.    Anion gap 8 5 - 15  CBC     Status: Abnormal   Collection Time: 03/20/15 11:21 PM  Result Value Ref Range   WBC 9.6 3.6 - 11.0 K/uL   RBC 3.95 3.80 - 5.20 MIL/uL   Hemoglobin 11.6 (L) 12.0 - 16.0 g/dL   HCT 35.6 35.0 - 47.0 %   MCV 90.0 80.0 - 100.0 fL   MCH 29.3 26.0 - 34.0 pg   MCHC 32.5 32.0 - 36.0 g/dL   RDW 15.6 (H) 11.5 - 14.5 %   Platelets 175 150 - 440 K/uL  Troponin I     Status: None   Collection Time: 03/20/15 11:21 PM  Result Value Ref Range   Troponin I <0.03 <0.031 ng/mL    Comment:        NO INDICATION OF MYOCARDIAL INJURY.   Protime-INR - (order if Patient is taking Coumadin / Warfarin)      Status: Abnormal   Collection Time: 03/20/15 11:21 PM  Result Value Ref Range   Prothrombin Time 18.5 (H) 11.4 - 15.0 seconds  INR 1.52   Glucose, capillary     Status: Abnormal   Collection Time: 03/20/15 11:22 PM  Result Value Ref Range   Glucose-Capillary 162 (H) 65 - 99 mg/dL   Dg Chest 2 View  03/20/2015   CLINICAL DATA:  Weakness  EXAM: CHEST  2 VIEW  COMPARISON:  02/16/2015 chest CT  FINDINGS: Chronic cardiomegaly. Dual-chamber pacer/ICD leads from the left are in unremarkable position.  Diffuse interstitial coarsening with [employer]. Hyperinflation. No consolidation, effusion, or air leak.  Lower thoracic to lumbar posterior fixation with chronic compression fracture at the level of the upper screws, status post vertebroplasty. No acute osseous finding.  IMPRESSION: Mild CHF.   Electronically Signed   By: Monte Fantasia M.D.   On: 03/20/2015 23:41    Review of Systems  Constitutional: Negative for fever and chills.  HENT: Negative for sore throat and tinnitus.   Eyes: Negative for blurred vision and redness.  Respiratory: Positive for shortness of breath. Negative for cough.   Cardiovascular: Negative for chest pain, palpitations, orthopnea and PND.  Gastrointestinal: Negative for nausea, vomiting, abdominal pain and diarrhea.  Genitourinary: Negative for dysuria, urgency and frequency.  Musculoskeletal: Positive for back pain. Negative for myalgias and joint pain.  Skin: Negative for rash.       No lesions  Neurological: Positive for weakness. Negative for speech change and focal weakness.  Endo/Heme/Allergies: Does not bruise/bleed easily.       No temperature intolerance  Psychiatric/Behavioral: Negative for depression and suicidal ideas.    Blood pressure 93/76, pulse 77, temperature 97.5 F (36.4 C), temperature source Oral, resp. rate 18, height 5' 7" (1.702 m), weight 78.019 kg (172 lb), SpO2 100 %. Physical Exam  Vitals reviewed. Constitutional: She is oriented  to person, place, and time. She appears well-developed and well-nourished. No distress.  HENT:  Head: Normocephalic and atraumatic.  Mouth/Throat: Oropharynx is clear and moist.  Eyes: Conjunctivae and EOM are normal. Pupils are equal, round, and reactive to light. No scleral icterus.  Neck: Normal range of motion. Neck supple. No JVD present. No tracheal deviation present. No thyromegaly present.  Cardiovascular: Normal rate, regular rhythm and normal heart sounds.  Exam reveals no gallop and no friction rub.   No murmur heard. Respiratory: Effort normal and breath sounds normal.  GI: Soft. Bowel sounds are normal. She exhibits no distension. There is no tenderness.  Genitourinary:  Deferred  Musculoskeletal: Normal range of motion. She exhibits edema.  Lymphadenopathy:    She has no cervical adenopathy.  Neurological: She is alert and oriented to person, place, and time. No cranial nerve deficit. She exhibits normal muscle tone.  Skin: Skin is warm and dry. No rash noted. No erythema.  Psychiatric: She has a normal mood and affect. Her behavior is normal. Judgment and thought content normal.     Assessment/Plan 81 year old Caucasian female admitted for CHF exacerbation.  1. CHF: presumably systolic; acute on chronic. I believe that the exacerbation of her heart failure is in part due to exertion while doing chores today, and also due to shallow respirations secondary to Fentanyl dosing.  I have removed her Fentanyl patch and we will observe respiratory status.  Continue home dose of Lasix plus metolazone and spironolactone if appropriate (stage IV?) 2. AKI: intravascularly dry; encourage po intake 3. Hypertension: continue losartan 4. Diabetes mellitus type 2: hold oral hypoglycemics and add sliding scale insulin while hospitalized 5. Afib: continue Eliquis 6. DVT pxs: as above 7. GI  pxs: pantoprazole per home regimen The patient is a full code.  Time spent on admission orders and  patient care approximately 35 minutes   Harrie Foreman 03/21/2015, 4:23 AM

## 2015-03-21 NOTE — Progress Notes (Signed)

## 2015-03-22 LAB — BASIC METABOLIC PANEL
ANION GAP: 10 (ref 5–15)
BUN: 26 mg/dL — ABNORMAL HIGH (ref 6–20)
CO2: 32 mmol/L (ref 22–32)
Calcium: 8.2 mg/dL — ABNORMAL LOW (ref 8.9–10.3)
Chloride: 95 mmol/L — ABNORMAL LOW (ref 101–111)
Creatinine, Ser: 1 mg/dL (ref 0.44–1.00)
GFR calc Af Amer: 57 mL/min — ABNORMAL LOW (ref >60)
GFR calc non Af Amer: 49 mL/min — ABNORMAL LOW (ref >60)
GLUCOSE: 96 mg/dL (ref 65–99)
POTASSIUM: 3.8 mmol/L (ref 3.5–5.1)
Sodium: 137 mmol/L (ref 135–145)

## 2015-03-22 LAB — GLUCOSE, CAPILLARY
Glucose-Capillary: 99 mg/dL (ref 65–99)
Glucose-Capillary: 218 mg/dL — ABNORMAL HIGH (ref 65–99)

## 2015-03-22 MED ORDER — FUROSEMIDE 10 MG/ML IJ SOLN
20.0000 mg | Freq: Once | INTRAMUSCULAR | Status: AC
  Administered 2015-03-22: 20 mg via INTRAVENOUS
  Filled 2015-03-22: qty 2

## 2015-03-22 NOTE — Progress Notes (Signed)
Per MD baclofen and requip held last night, keep patient alert, will give another dose of IV lasix and try to walk patient later

## 2015-03-22 NOTE — Discharge Instructions (Signed)
Heart Failure Clinic appointment on April 10, 2015 at 11:00am with Darylene Price, [place]. Please call [phone] to reschedule.

## 2015-03-22 NOTE — Progress Notes (Signed)
Pt discharged home with O2, tank in room, IV site DCd, tele monitor turned in, Dc instructions given to pt, understanding verbalized, pt to leave hospital in car with her daughter

## 2015-03-22 NOTE — Progress Notes (Signed)
[facility] PROGRESS NOTE Date of Admission:  03/20/2015     ID: [name] is a 81 y.o. female with a history of cardiac disease with a pacemaker and 2 cardiac stents placed currently taking Eliquis and aspirin. Came to ED with dyspnea, weakness and back pain.  Patient noted to be hypoxic on presentation to room O2 sat 89% on room air. Patient states that she's had recent changes in her medications by Dr. Renae Gloss . She states that she currently takes 40 mg of Lasix on one day and 80 mg a next alternating Active Problems:   Acute on chronic systolic CHF (congestive heart failure)  Subjective: More alert, held baclofen and removed fentanyl patch yest. Still on O2, cannot wean. Pain stable but improved from day of admission  ROS  Eleven systems are reviewed and negative except per hpi  Medications:  Antibiotics Given (last 72 hours)    None     . apixaban  5 mg Oral BID  . aspirin EC  81 mg Oral Daily  . baclofen  10 mg Oral BID  . carvedilol  6.25 mg Oral BID WC  . docusate sodium  100 mg Oral Daily  . escitalopram  10 mg Oral Daily  . ferrous sulfate  325 mg Oral Q breakfast  . furosemide  80 mg Oral Daily  . insulin aspart  0-9 Units Subcutaneous TID WC  . losartan  25 mg Oral Daily  . magnesium oxide  400 mg Oral Daily  . [START ON 03/23/2015] metolazone  2.5 mg Oral Once per day on Mon Thu  . oxyCODONE  5-10 mg Oral See admin instructions  . pantoprazole  40 mg Oral QAC breakfast  . rOPINIRole  0.5 mg Oral BID  . spironolactone  25 mg Oral Daily  . vitamin B-12  100 mcg Oral Daily  . vitamin C  500 mg Oral Daily  . [START ON 03/24/2015] Vitamin D (Ergocalciferol)  50,000 Units Oral Q7 days    Objective: Vital signs in last 24 hours: Temp:  [97.6 F (36.4 C)-98.2 F (36.8 C)] 98.2 F (36.8 C) (09/28 0325) Pulse Rate:  [68-77] 76 (09/28 0325) Resp:  [16-20] 18 (09/28 0325) BP: (114-132)/(49-58) 132/51 mmHg (09/28 0325) SpO2:  [90 %-96 %] 90 % (09/28  0325) Weight:  [74.254 kg (163 lb 11.2 oz)] 74.254 kg (163 lb 11.2 oz) (09/28 0325) Physical Exam  Constitutional:  oriented to person, place, and time. appears thin, kyphotic HENT: [place]/AT, PERRLA, no scleral icterus Mouth/Throat: Oropharynx is clear and moist. No oropharyngeal exudate.  Cardiovascular: Normal rate, regular rhythm and normal heart sounds Pulmonary/Chest: bibasilar crackles Neck = supple, no nuchal rigidity Abdominal: Soft. Bowel sounds are normal.  exhibits no distension. There is no tenderness.  Lymphadenopathy: no cervical adenopathy. No axillary adenopathy Neurological: alert and oriented to person, place, and time.  Skin: Skin is warm and dry. No rash noted. No erythema.  Psychiatric: a normal mood and affect.  behavior is normal.  Back ttp T spine  Lab Results  Recent Labs  03/20/15 2321 03/22/15 0339  WBC 9.6  --   HGB 11.6*  --   HCT 35.6  --   NA 136 137  K 4.1 3.8  CL 100* 95*  CO2 28 32  BUN 30* 26*  CREATININE 1.20* 1.00    Studies/Results: Dg Chest 2 View  03/20/2015   CLINICAL DATA:  Weakness  EXAM: CHEST  2 VIEW  COMPARISON:  02/16/2015 chest CT  FINDINGS: Chronic cardiomegaly. Dual-chamber pacer/ICD leads from the left are in unremarkable position.  Diffuse interstitial coarsening with [employer]. Hyperinflation. No consolidation, effusion, or air leak.  Lower thoracic to lumbar posterior fixation with chronic compression fracture at the level of the upper screws, status post vertebroplasty. No acute osseous finding.  IMPRESSION: Mild CHF.   Electronically Signed   By: Monte Fantasia M.D.   On: 03/20/2015 23:41   Dg Thoracic Spine 2 View  03/21/2015   CLINICAL DATA:  Chronic back pain worse over the past several days.  EXAM: THORACIC SPINE 2 VIEWS  COMPARISON:  Chest x-ray 03/20/2015 and 08/19/2013 as well as CT 02/16/2015  FINDINGS: Examination demonstrates evidence of patient's posterior fusion hardware over the thoracolumbar spine intact and  unchanged. The most superior screws extend into a lower thoracic spine compression fracture which is unchanged. No new compression fractures. Mild spondylosis throughout the thoracic spine. No evidence of subluxation. Pedicles are intact. There is calcified plaque over the thoracic aorta.  IMPRESSION: Mild spondylosis throughout the thoracic spine with stable moderate compression fracture at the approximate T10 level. No new compression deformities. Posterior fusion hardware intact from the lower thoracic spine into the visualized lumbar spine.   Electronically Signed   By: Marin Olp M.D.   On: 03/21/2015 11:01    Assessment/Plan: Acute CHF- given IV lasix x1, resume oral lasix and zaroxyln and spirinolactone.  Stable Give extra IV lasix today  Back pain - s/p 2 back surgeries, CT august 2016 showed stable sup endplate compression. - stop fentanyl 25 mcg - I suspect some overdosign of her pain meds  - careful management given some lethargy.   CAD/htn - cycle enzymes Cont coreg, losartan, asa and diuretics as above  DM- A1c 7.0  Debility - ? Due to over use of pain meds  Will get PT assessmnet   0FITZGERALD, DAVID   03/22/2015, 8:37 AM

## 2015-03-22 NOTE — Discharge Summary (Signed)
Physician Discharge Summary  Patient ID: [name] MRN: [id number] DOB/AGE: 1925/03/15 81 y.o.  Admit date: 03/20/2015 Discharge date: 03/22/2015  Admission Diagnoses:  Discharge Diagnoses:  Active Problems:   Acute on chronic systolic CHF (congestive heart failure)  Discharged Condition: fair  Hospital Course:  Acute CHF- received IV lasix - resumed  oral lasix and zaroxyln and spirinolactone.  Improved-   IPF- will start home O2  Back pain - s/p 2 back surgeries, CT august 2016 showed stable sup endplate compression. - stopped fentanyl 25 mcg - I suspect some accidnetal overdosing  of her pain meds  - careful management given lethargy.  Will have her hold fentanyl and use prn pain meds  CAD/htn - cycle enzymes Cont coreg, losartan, asa and diuretics as above  DM- A1c 7.0 Resume otpt meds   Debility - Home PT assessmnet Consults: None  Significant Diagnostic Studies: radiology:   Treatments:  Diuresis, O2  Discharge Exam: Blood pressure 120/73, pulse 75, temperature 97.8 F (36.6 C), temperature source Oral, resp. rate 18, height 5\' 7"  (1.702 m), weight 74.254 kg (163 lb 11.2 oz), SpO2 87 %. See note today  Disposition: 01-Home or Self Care  Discharge Instructions    AMB referral to CHF clinic    Complete by:  As directed             Medication List    STOP taking these medications        fentaNYL 50 MCG/HR  Commonly known as:  DURAGESIC - dosed mcg/hr      TAKE these medications        aspirin 81 MG tablet  Take 81 mg by mouth daily.     baclofen 10 MG tablet  Commonly known as:  LIORESAL  Take 10 mg by mouth 2 (two) times daily.     carvedilol 6.25 MG tablet  Commonly known as:  COREG  Take 6.25 mg by mouth 2 (two) times daily with a meal.     docusate sodium 100 MG capsule  Commonly known as:  COLACE  Take 100 mg by mouth daily.     ELIQUIS 5 MG Tabs tablet  Generic drug:  apixaban  Take 5 mg by mouth 2 (two) times daily.      escitalopram 10 MG tablet  Commonly known as:  LEXAPRO  Take 10 mg by mouth daily.     ferrous sulfate 325 (65 FE) MG tablet  Take 325 mg by mouth daily with breakfast.     furosemide 80 MG tablet  Commonly known as:  LASIX  Take 80 mg by mouth daily.     glimepiride 4 MG tablet  Commonly known as:  AMARYL  Take 4 mg by mouth 2 (two) times daily after a meal.     JANUVIA 100 MG tablet  Generic drug:  sitaGLIPtin  Take 100 mg by mouth daily.     losartan 25 MG tablet  Commonly known as:  COZAAR  Take 25 mg by mouth daily.     Magnesium Oxide -Mg Supplement 400 MG Caps  Take 400 mg by mouth daily.     Melatonin 1 MG Caps  Take 1 mg by mouth at bedtime.     metolazone 2.5 MG tablet  Commonly known as:  ZAROXOLYN  Take 2.5 mg by mouth 2 (two) times a week. Takes on Monday and Friday     oxycodone 5 MG capsule  Commonly known as:  OXY-IR  Take 5-10 mg by  mouth See admin instructions. 5mg  in the morning and mid-afternoon and 10mg  at bedtime     pantoprazole 40 MG tablet  Commonly known as:  PROTONIX  Take 40 mg by mouth daily.     rOPINIRole 0.5 MG tablet  Commonly known as:  REQUIP  Take 0.5 mg by mouth 2 (two) times daily.     spironolactone 25 MG tablet  Commonly known as:  ALDACTONE  Take 1 tablet by mouth daily.     vitamin B-12 100 MCG tablet  Commonly known as:  CYANOCOBALAMIN  Take 100 mcg by mouth daily.     vitamin C 500 MG tablet  Commonly known as:  ASCORBIC ACID  Take 500 mg by mouth daily.     Vitamin D (Ergocalciferol) 50000 UNITS Caps capsule  Commonly known as:  DRISDOL  Take 50,000 Units by mouth every 7 (seven) days.           Follow-up Information    Follow up with Sarah Graff, [name]. Go on 04/10/2015.   Specialty:  Family Medicine   Why:  at 11:00am , to the [facility]   Contact information:   [place] [address] [place] [address] [phone]       Follow up with Tracie Harrier, MD In 2  weeks.   Specialty:  Internal Medicine   Contact information:   [place] [address] [phone]      Signed: [place], [place] 03/22/2015, 2:22 PM

## 2015-03-22 NOTE — Evaluation (Signed)
Physical Therapy Evaluation Patient Details [name] MRN: [id number] DOB: [birth year] Today's Date: 03/22/2015   History of Present Illness  Patient is an 81 y/o female admitted with dyspnea, weakness, and back pain.   Clinical Impression  Patient presents from home where she maintains a very active lifestyle. She had been attending OP PT in [place] prior to this admission and presents with decreased balance and stamina during this session. On room air she desats to 87% sitting, and 87% on 1L of O2 during ambulation. She is able to maintain >90% on 2L with frequent rest breaks. She displays decreased balance scores today indicating she is at high risk of falling, and educated extensively to use RW at all times. Patient will continue to benefit from skilled PT services to address her mobility and balance deficits.     Follow Up Recommendations Home health PT    Equipment Recommendations       Recommendations for Other Services       Precautions / Restrictions Precautions Precautions: Fall Restrictions Weight Bearing Restrictions: No      Mobility  Bed Mobility Overal bed mobility: Independent             General bed mobility comments: Patient requires no assistance to enter/exit flat bed.   Transfers Overall transfer level: Needs assistance Equipment used: Rolling walker (2 wheeled) Transfers: Sit to/from Stand Sit to Stand: Supervision         General transfer comment: Patient is able to complete sit to stand with her hands off RW, minimal balance deficits in transfer noted.   Ambulation/Gait Ambulation/Gait assistance: Supervision Ambulation Distance (Feet): 360 Feet Assistive device: Rolling walker (2 wheeled) Gait Pattern/deviations: WFL(Within Functional Limits)   Gait velocity interpretation: Below normal speed for age/gender General Gait Details: LLE has foot in eversion, she states this has been so for years. Reciprocal gait pattern, mildly  decreased speed. Patient encouraged to take rest breaks frequently to maintain O2 sats. On 1L desttated to 87-88%, on 2L able to maintain > 90%.   Stairs            Wheelchair Mobility    Modified Rankin (Stroke Patients Only)       Balance Overall balance assessment: Needs assistance   Sitting balance-Leahy Scale: Normal     Standing balance support: Bilateral upper extremity supported Standing balance-Leahy Scale: Fair Standing balance comment: 5x sit to stand 23 seconds, Tinetti - 19/28 indicating she is at high risk of falling.                              Pertinent Vitals/Pain Pain Assessment:  (Patient states she has had back pain for years and that she has it currently)    Home Living Family/patient expects to be discharged to:: Private residence Living Arrangements: Spouse/significant other Available Help at Discharge: Family;Available PRN/intermittently Type of Home: House Home Access: Stairs to enter Entrance Stairs-Rails:  (From what PT could gather there are rails.) Entrance Stairs-Number of Steps: 5 Home Layout: One level (1 step in house, does not use frequently.) Home Equipment: Walker - 2 wheels;Cane - single point      Prior Function Level of Independence: Independent with assistive device(s)         Comments: Patient has been using a RW for all mobility, at times she forgets to use it.      Hand Dominance        Extremity/Trunk Assessment  Upper Extremity Assessment: Overall WFL for tasks assessed           Lower Extremity Assessment: Overall WFL for tasks assessed         Communication   Communication: No difficulties  Cognition Arousal/Alertness: Awake/alert Behavior During Therapy: WFL for tasks assessed/performed Overall Cognitive Status: Within Functional Limits for tasks assessed                      General Comments      Exercises        Assessment/Plan    PT Assessment Patient needs  continued PT services  PT Diagnosis Difficulty walking;Abnormality of gait;Generalized weakness   PT Problem List Decreased activity tolerance;Decreased strength;Decreased mobility;Cardiopulmonary status limiting activity;Decreased balance  PT Treatment Interventions DME instruction;Therapeutic activities;Therapeutic exercise;Gait training;Stair training;Balance training   PT Goals (Current goals can be found in the Care Plan section) Acute Rehab PT Goals Patient Stated Goal: To return hoem  PT Goal Formulation: With patient/family Time For Goal Achievement: 04/05/15 Potential to Achieve Goals: Good    Frequency Min 2X/week   Barriers to discharge        Co-evaluation               End of Session Equipment Utilized During Treatment: Gait belt Activity Tolerance: Patient tolerated treatment well Patient left: in bed;with call bell/phone within reach;with family/visitor present;with bed alarm set Nurse Communication: Mobility status (O2 sats)         Time: 1610-9604 PT Time Calculation (min) (ACUTE ONLY): 25 min   Charges:   PT Evaluation $Initial PT Evaluation Tier I: 1 Procedure     PT G Codes:        Royce Macadamia 03/22/2015, 1:29 PM

## 2015-03-22 NOTE — Progress Notes (Signed)
Patient was made an initial appointment at the [facility] on April 10, 2015 at 11:00am. Thank you.

## 2015-03-22 NOTE — Progress Notes (Addendum)
PT Cancellation Note  Patient Details [name] MRN: [id number] DOB: 20-Jul-1924   Cancelled Treatment:    Reason Eval/Treat Not Completed: Fatigue/lethargy limiting ability to participate. Patient has just been ambulating to the bathroom and has had her O2 sats in the 70s. Patient asks PT to return at a later time.   Kerman Passey, PT, DPT    03/22/2015, 9:40 AM

## 2015-03-22 NOTE — Progress Notes (Signed)
   03/22/15 0830 03/22/15 0835 03/22/15 1119  Oxygen Therapy  SpO2 (!) 81 % (ambulating to BR) 92 % (ambulating w/phys therapy) 94 %  O2 Device Room Air Nasal Cannula Nasal Cannula  O2 Flow Rate (L/min) --  2 L/min 2 L/min     03/22/15 1317  Oxygen Therapy  SpO2 (!) 87 % (87% on room air at rest, added 1L of O2 increased to 91% at rest, decreased to 87% with ambulation. On 2L no drop in O2 below 91%.)  O2 Device Room Air  O2 Flow Rate (L/min) --

## 2015-03-22 NOTE — Care Management (Signed)
Patient is to discharge home today and has qualified for home 02.  Agency choice is [facility].   Patient admitted with exacerbation of chf but it is also thought that patient chronic use of pain meds for chronic back pain caused a depression in  her respiratory status.  She is followed by [facility] pain clinic for chronic back pain.  Attending has discontinued her Fentanyl patch.  Patient's husband at present is experiencing intense pain in his knee and at current time is being seen at [facility].  Patient's daughter Joana Reamer [phone] says that patient and her husband were receiving some in home services through [facility] but " they had them to stop>'  Patient says' we just asked the agency to put [place] in reserve."  Patient agrees that she and her husband now needs to have some in home services resumed.  Until that can be coordinated, patient and her daughter are agreeable to home health nursing, physical therapy and aide.  CM left a voicemail message for Home Care Providers regarding the need to have in home services .  [facility] contacted for the home health referral and home 02

## 2015-03-23 ENCOUNTER — Encounter: Payer: Medicare PPO | Admitting: Physical Therapy

## 2015-03-28 ENCOUNTER — Encounter: Payer: Medicare PPO | Admitting: Physical Therapy

## 2015-03-30 ENCOUNTER — Encounter: Payer: Medicare PPO | Admitting: Physical Therapy

## 2015-04-04 ENCOUNTER — Encounter: Payer: Medicare PPO | Admitting: Physical Therapy

## 2015-04-06 ENCOUNTER — Encounter: Payer: Medicare PPO | Admitting: Physical Therapy

## 2015-04-10 ENCOUNTER — Ambulatory Visit: Payer: Medicare PPO | Attending: Family | Admitting: Family

## 2015-04-10 ENCOUNTER — Encounter: Payer: Self-pay | Admitting: Family

## 2015-04-10 VITALS — BP 105/48 | HR 75 | Resp 20 | Ht 67.0 in | Wt 166.0 lb

## 2015-04-10 DIAGNOSIS — I4891 Unspecified atrial fibrillation: Secondary | ICD-10-CM | POA: Insufficient documentation

## 2015-04-10 DIAGNOSIS — M549 Dorsalgia, unspecified: Secondary | ICD-10-CM | POA: Diagnosis not present

## 2015-04-10 DIAGNOSIS — Z79899 Other long term (current) drug therapy: Secondary | ICD-10-CM | POA: Insufficient documentation

## 2015-04-10 DIAGNOSIS — I5032 Chronic diastolic (congestive) heart failure: Secondary | ICD-10-CM | POA: Diagnosis present

## 2015-04-10 DIAGNOSIS — G8929 Other chronic pain: Secondary | ICD-10-CM | POA: Diagnosis not present

## 2015-04-10 DIAGNOSIS — Z7901 Long term (current) use of anticoagulants: Secondary | ICD-10-CM | POA: Insufficient documentation

## 2015-04-10 DIAGNOSIS — E119 Type 2 diabetes mellitus without complications: Secondary | ICD-10-CM | POA: Insufficient documentation

## 2015-04-10 DIAGNOSIS — I480 Paroxysmal atrial fibrillation: Secondary | ICD-10-CM

## 2015-04-10 DIAGNOSIS — I10 Essential (primary) hypertension: Secondary | ICD-10-CM | POA: Diagnosis not present

## 2015-04-10 DIAGNOSIS — Z7982 Long term (current) use of aspirin: Secondary | ICD-10-CM | POA: Diagnosis not present

## 2015-04-10 NOTE — Progress Notes (Signed)
Subjective:    Patient ID: [name], female    DOB: 02-Apr-1925, 81 y.o.   MRN: [id number]  Congestive Heart Failure Presents for initial visit. The disease course has been stable. Associated symptoms include edema and fatigue. Pertinent negatives include no abdominal pain, chest pain, chest pressure, orthopnea, palpitations or shortness of breath. The symptoms have been improving. Past treatments include angiotensin receptor blockers, aldosterone receptor blockers, beta blockers and salt and fluid restriction. The treatment provided moderate relief. Compliance with prior treatments has been good. Her past medical history is significant for DM and HTN. There is no history of CVA. She has one 1st degree relative with heart disease. Compliance with total regimen is 76-100%.  Atrial Fibrillation Presents for initial visit. Symptoms include dizziness (at times when get too tired) and hypotension. Symptoms are negative for bradycardia, chest pain, hypertension, palpitations, shortness of breath and tachycardia. The symptoms have been improving. Past treatments include beta blockers and anticoagulant. Compliance with prior treatments has been good. Past medical history includes atrial fibrillation, CHF and HTN.    Past Medical History  Diagnosis Date  . CHF (congestive heart failure) ([place])   . Diabetes mellitus without complication ([place])   . Hypertension   . Chronic back pain     Past Surgical History  Procedure Laterality Date  . Pacemaker placement    . Leg surgery    . Abdominal hysterectomy    . Tonsillectomy    . Back surgery      x3  . Coronary stent placement      unknown location per pt  . Foot surgery      Family History  Problem Relation Age of Onset  . CVA Mother   . Diabetes Mellitus II Sister   . Heart disease Father     Social History  Substance Use Topics  . Smoking status: Never Smoker   . Smokeless tobacco: Not on file  . Alcohol Use: No     Allergies  Allergen Reactions  . Ceftriaxone Swelling  . Cefuroxime Hives  . Cephalosporins     Fainting   . Codeine Nausea Only  . Epinephrine     Fainting   . Morphine And Related Nausea Only  . Rocephin [Ceftriaxone Sodium In Dextrose] Swelling  . Buprenorphine Hcl Nausea Only  . Ciprofloxacin Itching, Rash, Swelling and Other (See Comments)    Itching and swelling  . Latex Rash  . Lidocaine Rash  . Procaine Rash and Other (See Comments)    Prior to Admission medications   Medication Sig Start Date End Date Taking? Authorizing Provider  apixaban (ELIQUIS) 5 MG TABS tablet Take 5 mg by mouth 2 (two) times daily.  12/12/14  Yes Historical Provider, MD  aspirin 81 MG tablet Take 81 mg by mouth daily.   Yes Historical Provider, MD  baclofen (LIORESAL) 10 MG tablet Take 10 mg by mouth 2 (two) times daily.  12/08/14  Yes Historical Provider, MD  carvedilol (COREG) 6.25 MG tablet Take 6.25 mg by mouth 2 (two) times daily with a meal.    Yes Historical Provider, MD  docusate sodium (COLACE) 100 MG capsule Take 100 mg by mouth daily.   Yes Historical Provider, MD  escitalopram (LEXAPRO) 10 MG tablet Take 10 mg by mouth daily.   Yes Historical Provider, MD  ferrous sulfate 325 (65 FE) MG tablet Take 325 mg by mouth daily with breakfast.   Yes Historical Provider, MD  furosemide (LASIX) 80 MG tablet Take  80 mg by mouth every other day. 80mg  every other day and 40mg  every other day 02/14/15  Yes Historical Provider, MD  glimepiride (AMARYL) 4 MG tablet Take 4 mg by mouth 2 (two) times daily after a meal.   Yes Historical Provider, MD  JANUVIA 100 MG tablet Take 100 mg by mouth daily. 10/27/14  Yes Historical Provider, MD  losartan (COZAAR) 25 MG tablet Take 25 mg by mouth daily.   Yes Historical Provider, MD  Magnesium Oxide -Mg Supplement 400 MG CAPS Take 400 mg by mouth daily.   Yes Historical Provider, MD  Melatonin 1 MG CAPS Take 1 mg by mouth at bedtime.    Yes Historical Provider, MD   metolazone (ZAROXOLYN) 2.5 MG tablet Take 2.5 mg by mouth 2 (two) times a week. Takes on Monday and Friday   Yes Historical Provider, MD  oxycodone (OXY-IR) 5 MG capsule Take 5-10 mg by mouth See admin instructions. 5mg  in the morning and mid-afternoon and 10mg  at bedtime   Yes Historical Provider, MD  pantoprazole (PROTONIX) 40 MG tablet Take 40 mg by mouth daily.   Yes Historical Provider, MD  rOPINIRole (REQUIP) 0.5 MG tablet Take 0.5 mg by mouth 2 (two) times daily.   Yes Historical Provider, MD  spironolactone (ALDACTONE) 25 MG tablet Take 1 tablet by mouth daily.   Yes Historical Provider, MD  vitamin B-12 (CYANOCOBALAMIN) 100 MCG tablet Take 100 mcg by mouth daily.   Yes Historical Provider, MD  vitamin C (ASCORBIC ACID) 500 MG tablet Take 500 mg by mouth daily.   Yes Historical Provider, MD  Vitamin D, Ergocalciferol, (DRISDOL) 50000 UNITS CAPS capsule Take 50,000 Units by mouth every 7 (seven) days.    Yes Historical Provider, MD      Review of Systems  Constitutional: Positive for fatigue. Negative for appetite change.  HENT: Negative for congestion, postnasal drip and sore throat.   Eyes: Negative for pain and visual disturbance.  Respiratory: Negative for cough, chest tightness and shortness of breath.   Cardiovascular: Positive for leg swelling. Negative for chest pain and palpitations.  Gastrointestinal: Negative for abdominal pain and abdominal distention.  Endocrine: Negative.   Genitourinary: Positive for frequency. Negative for dysuria.  Musculoskeletal: Positive for back pain (chronic back pain due to 14 screws and 2 rods placed). Negative for neck pain.  Skin: Positive for wound (left middle toe). Negative for rash.  Allergic/Immunologic: Negative.   Neurological: Positive for dizziness (at times when get too tired). Negative for light-headedness and headaches.  Hematological: Negative for adenopathy. Bruises/bleeds easily.  Psychiatric/Behavioral: Positive for sleep  disturbance (sleeping on 1 pillow; getting up at night to urinate) and dysphoric mood (due to health and youngest son's health). The patient is not nervous/anxious.        Objective:   Physical Exam  Constitutional: She is oriented to person, place, and time. She appears well-developed and well-nourished.  HENT:  Head: Normocephalic and atraumatic.  Eyes: Conjunctivae are normal. Pupils are equal, round, and reactive to light.  Neck: Normal range of motion. Neck supple.  Cardiovascular: Normal rate and regular rhythm.   Pulmonary/Chest: Effort normal. She has no wheezes. She has no rales.  Abdominal: Soft. She exhibits no distension. There is no tenderness.  Musculoskeletal: She exhibits edema (1+ in bilateral lower legs). She exhibits no tenderness.  Neurological: She is alert and oriented to person, place, and time.  Skin: Skin is warm and dry.  Psychiatric: She has a normal mood and affect. Her  behavior is normal. Thought content normal.  Nursing note and vitals reviewed.   BP 105/48 mmHg  Pulse 75  Resp 20  Ht 5\' 7"  (1.702 m)  Wt 166 lb (75.297 kg)  BMI 25.99 kg/m2  SpO2 94%       Assessment & Plan:  1: Chronic heart failure with preserved ejection fraction- Echo done 08/29/14 with an EF of 55-60% according to PCP's last office note. Patient presents with fatigue with minimal exertion. She does try to space her activities out but admit that it's difficult doing that at times. She currently doesn't have any shortness of breath. She does endorse swelling in her lower legs but says that it has improved. She currently takes aldactone daily, metolazone twice a week and alternating doses of furosemide every other day. She doesn't elevate her legs much during the day and says that the swelling really doesn't come down overnight. Encouraged her to elevate her legs as much as possible when she's sitting at home. She is already weighing herself daily and she was instructed to call for an  overnight weight gain of >2 pounds or a weekly weight gain of >5 pounds. She does not add salt to her food and tries to read food labels carefully. Reviewed the importance of following a 2000mg  sodium diet and written information was given to her about that. She says that she received a phone call from heart track but has to call them back.  2: Atrial fibrillation- Currently rate controlled at this time. She is taking eliquis and says that it's 5mg  twice daily but that ever since she has been on it, she hasn't felt "good". She says that she's been taking it for 6-7 months and she was encouraged to speak with her PCP and cardiologist about that.  3: HTN- Blood pressure on the low side today but patient is currently without dizziness. She says that she only experiences dizziness when she gets overly tired. Card given for patient to write her blood pressure down on. 4: Diabetes- She says that her glucose this morning was 101. Follows closely with her PCP regarding this.  Return in 1 month or sooner for any questions/problems before then.

## 2015-04-10 NOTE — Patient Instructions (Signed)
Continue weighing daily and call for an overnight weight gain of > 2 pounds or a weekly weight gain of >5 pounds. 

## 2015-04-11 ENCOUNTER — Encounter: Payer: Medicare PPO | Admitting: Physical Therapy

## 2015-04-13 ENCOUNTER — Encounter: Payer: Medicare PPO | Admitting: Physical Therapy

## 2015-04-25 ENCOUNTER — Encounter: Payer: Self-pay | Admitting: Physical Therapy

## 2015-04-25 ENCOUNTER — Ambulatory Visit: Payer: Medicare PPO | Attending: Internal Medicine | Admitting: Physical Therapy

## 2015-04-25 VITALS — BP 120/48 | HR 70

## 2015-04-25 DIAGNOSIS — R293 Abnormal posture: Secondary | ICD-10-CM | POA: Insufficient documentation

## 2015-04-25 DIAGNOSIS — R5381 Other malaise: Secondary | ICD-10-CM | POA: Diagnosis not present

## 2015-04-25 DIAGNOSIS — M6281 Muscle weakness (generalized): Secondary | ICD-10-CM | POA: Diagnosis present

## 2015-04-25 DIAGNOSIS — M2560 Stiffness of unspecified joint, not elsewhere classified: Secondary | ICD-10-CM | POA: Insufficient documentation

## 2015-04-25 DIAGNOSIS — R531 Weakness: Secondary | ICD-10-CM | POA: Diagnosis present

## 2015-04-25 DIAGNOSIS — M545 Low back pain, unspecified: Secondary | ICD-10-CM

## 2015-04-25 DIAGNOSIS — R262 Difficulty in walking, not elsewhere classified: Secondary | ICD-10-CM | POA: Insufficient documentation

## 2015-04-25 DIAGNOSIS — G8929 Other chronic pain: Secondary | ICD-10-CM | POA: Diagnosis present

## 2015-04-25 DIAGNOSIS — M25561 Pain in right knee: Secondary | ICD-10-CM | POA: Insufficient documentation

## 2015-04-25 NOTE — Therapy (Signed)
[facility] [address]. [place], [place], [address] Phone: [phone]   Fax:  [phone]  Physical Therapy Evaluation  Patient Details  [name] MRN: [id number] Date of Birth: [birth year] Referring Provider: Dr. Ginette Pitman  Encounter Date: 04/25/2015      PT End of Session - 04/25/15 1523    Visit Number 1   Number of Visits 10   Date for PT Re-Evaluation 05/23/15   Authorization - Visit Number 1   Authorization - Number of Visits 10   PT Start Time 1450   PT Stop Time 1540   PT Time Calculation (min) 50 min   Equipment Utilized During Treatment Gait belt   Activity Tolerance Patient tolerated treatment well;Patient limited by fatigue;Patient limited by pain  Pt's BP 126/44 both manually and with the machine   Behavior During Therapy [facility] for tasks assessed/performed      Past Medical History  Diagnosis Date  . CHF (congestive heart failure) ([place])   . Diabetes mellitus without complication ([place])   . Hypertension   . Chronic back pain     Past Surgical History  Procedure Laterality Date  . Pacemaker placement    . Leg surgery    . Abdominal hysterectomy    . Tonsillectomy    . Back surgery      x3  . Coronary stent placement      unknown location per pt  . Foot surgery      Filed Vitals:   04/25/15 1518  BP: 120/48  Pulse: 70  SpO2: 98%    Visit Diagnosis:  Physical deconditioning  Generalized weakness  Chronic midline low back pain without sciatica  Difficulty walking  Right knee pain      Subjective Assessment - 04/25/15 1518    Subjective Pt reports being active by walking and performing HEP from HHPT. Pt reports R knee pain at 3/10 and back pain at 8/10 constantly. Pt reports falling a week and a half ago when getting up to go to the bathroom. Pt reports noticing increased R knee soreness after she fell. Pt states it felt like her legs gave out when she fell.    Patient is  accompained by: Family member   Limitations Walking;Standing;Lifting;House hold activities   How long can you sit comfortably? 15-20 min.    How long can you stand comfortably? 5 mins before back pain occurs   How long can you walk comfortably? with use of rollator for 3 mins   Patient Stated Goals walk with SPC/get better/increase balance and strength/ decrease LBP   Currently in Pain? Yes   Pain Score 3    Pain Location Knee   Pain Orientation Right   Pain Type Acute pain   Pain Onset 1 to 4 weeks ago   Pain Frequency Constant   Multiple Pain Sites Yes   Pain Score 8   Pain Location Back   Pain Orientation Lower   Pain Type Chronic pain   Pain Onset More than a month ago   Pain Frequency Constant   Pain Relieving Factors flexed posture         OBJECTIVE: There ex: In // bars: standing hip abduction/extension/flexion x 5 each leg with B UE support. Gait in // bars with B UE support forwards and backwards walking. Verbal cuing required for normal base of support and decreased scissoring pattern. Standing marching x 15 each leg. Neuro re-ed: Balance on firm ground in tandem and  single leg stance with one UE support. Cool Down: Nustep L5 for 5 mins with rest breaks every min.          [facility] PT Assessment - 04/25/15 0001    Assessment   Medical Diagnosis generalized weakness; history of recent falls; acute pain of R knee   Referring Provider Dr. Ginette Pitman   Onset Date/Surgical Date 06/24/14   Next MD Visit 05/01/2015            PT Short Term Goals - 01/24/15 1237    PT SHORT TERM GOAL #1   Title --   Time --   Period --   Status --   PT SHORT TERM GOAL #2   Title --   Time --   Period --   Status --   PT SHORT TERM GOAL #3   Title --   Time --   Period --   Status --           PT Long Term Goals - 04/25/15 1534    PT LONG TERM GOAL #1   Title Pt will improve her BERG score to > 45/56 in order to promote safety with functional mobility.   Baseline 37/56  on 11/1   Time 4   Period Weeks   Status New   PT LONG TERM GOAL #2   Title Pt will be I with HEP in order to increase B LE strength by 1/2 MMT to improve standing tolerance without increased reports of pain.    Time 4   Period Weeks   Status New   PT LONG TERM GOAL #3   Title Pt will ambulate with a SPC for household distances without LOB in order to improve safety in her home.    Time 4   Period Weeks   Status New   PT LONG TERM GOAL #4   Title Pt. able to transition to [facility] ex. program at [facility] with no increase c/o pain.    Time 4   Period Weeks   Status New            Plan - 04/25/15 1524    Clinical Impression Statement Pt is a pleasant 81 y.o who arrives with a 3 wheeled rollator. Pt's pain in her R knee is 3/10 and back is 8/10. Pt is referred to outpatient PT after completing a course of HHPT. Pt is referred for generalized weakness and recurrent falls. Pt reports falling a week and a half ago secondary to muscle weakness/fatigue. Pt MMT is grossly 3/5 on the R and 4/5 on the L. Pt has difficulty bilaterally with hamstring strength. Pt reports increased back pain with all standing hip exercises. Pt is able to correct with verbal and tactile cues but does not maintain corrections. Pt's BERG: 37/56. Pt is limited with overall deconditioning and generalized weakness.  Significant pitting edema in B lower legs with recent increase in diuretic.  Pt will benefit from short term PT in order to increase endurance, strength, balance, gait and safety with functional mobility.    Pt will benefit from skilled therapeutic intervention in order to improve on the following deficits Abnormal gait;Decreased activity tolerance;Decreased balance;Decreased range of motion;Difficulty walking;Pain;Hypomobility;Decreased strength;Postural dysfunction;Improper body mechanics;Impaired flexibility;Decreased safety awareness;Decreased endurance;Decreased coordination   Rehab Potential Fair    Clinical Impairments Affecting Rehab Potential chronicity of back pain, co-morbidites, BP issues   PT Frequency 2x / week   PT Duration 4 weeks   PT Treatment/Interventions ADLs/Self Care Home Management;Cryotherapy;Moist  Heat;Balance training;Therapeutic exercise;Manual techniques;Therapeutic activities;Functional mobility training;Stair training;Gait training;Patient/family education;Neuromuscular re-education   PT Next Visit Plan gait and balance to possibly progress to SPC/endurance work/standing tolerance and balance/Airex/Dynamic gait   PT Home Exercise Plan continue walking/exercises from [place] and Agree with Plan of Care Patient          G-Codes - 2015/05/25 1548    Functional Assessment Tool Used Oswestry/ Berg/ clinical judgement/ gait   Functional Limitation Mobility: Walking and moving around   Mobility: Walking and Moving Around Current Status [phone]) At least 40 percent but less than 60 percent impaired, limited or restricted   Mobility: Walking and Moving Around Goal Status [phone]) At least 1 percent but less than 20 percent impaired, limited or restricted       Problem List Patient Active Problem List   Diagnosis Date Noted  . Chronic diastolic heart failure ([place]) 04/10/2015  . Atrial fibrillation ([place]) 04/10/2015  . Essential hypertension 04/10/2015  . Diabetes ([place]) 04/10/2015  . Back pain 04/10/2015  . Acute on chronic systolic CHF (congestive heart failure) ([place]) 03/21/2015  . Pulmonary nodule 11/10/2014   Pura Spice, PT, DPT # [phone]   05-25-15, 3:55 PM  [facility] [address] [place], [place], [address] Phone: [phone]   Fax:  [phone]  [name] MRN: [id number] Date of Birth: 04-Sep-1924

## 2015-04-27 ENCOUNTER — Ambulatory Visit: Payer: Medicare PPO | Admitting: Physical Therapy

## 2015-04-27 ENCOUNTER — Encounter: Payer: Self-pay | Admitting: Physical Therapy

## 2015-04-27 DIAGNOSIS — R5381 Other malaise: Secondary | ICD-10-CM | POA: Diagnosis not present

## 2015-04-27 DIAGNOSIS — M2560 Stiffness of unspecified joint, not elsewhere classified: Secondary | ICD-10-CM

## 2015-04-27 DIAGNOSIS — G8929 Other chronic pain: Secondary | ICD-10-CM

## 2015-04-27 DIAGNOSIS — R531 Weakness: Secondary | ICD-10-CM

## 2015-04-27 DIAGNOSIS — R262 Difficulty in walking, not elsewhere classified: Secondary | ICD-10-CM

## 2015-04-27 DIAGNOSIS — M545 Low back pain: Secondary | ICD-10-CM

## 2015-04-27 DIAGNOSIS — R293 Abnormal posture: Secondary | ICD-10-CM

## 2015-04-27 DIAGNOSIS — M6281 Muscle weakness (generalized): Secondary | ICD-10-CM

## 2015-04-27 DIAGNOSIS — M25561 Pain in right knee: Secondary | ICD-10-CM

## 2015-04-27 NOTE — Therapy (Signed)
[facility] [address] [place], [place], [address] Phone: [phone]   Fax:  [phone]  Physical Therapy Treatment  Patient Details  [name] MRN: [id number] Date of Birth: 11/30/1924 Referring Provider: Dr. Ginette Pitman  Encounter Date: 04/27/2015      PT End of Session - 04/27/15 1724    Visit Number 2   Number of Visits 10   Date for PT Re-Evaluation 05/23/15   Authorization - Visit Number 2   Authorization - Number of Visits 6  [employer] authorized 5 treatments   PT Start Time 1350   PT Stop Time 1430   PT Time Calculation (min) 40 min   Equipment Utilized During Treatment Gait belt   Activity Tolerance Patient tolerated treatment well;Patient limited by fatigue;Patient limited by pain;Treatment limited secondary to medical complications (Comment)  Pt's BP 106/47 HR 72 and O2 sat 89% at arrival; BP after 96/44 HR 80 O2 sat 93%   Behavior During Therapy [facility] for tasks assessed/performed      Past Medical History  Diagnosis Date  . CHF (congestive heart failure) ([place])   . Diabetes mellitus without complication ([place])   . Hypertension   . Chronic back pain     Past Surgical History  Procedure Laterality Date  . Pacemaker placement    . Leg surgery    . Abdominal hysterectomy    . Tonsillectomy    . Back surgery      x3  . Coronary stent placement      unknown location per pt  . Foot surgery      There were no vitals filed for this visit.  Visit Diagnosis:  Physical deconditioning  Generalized weakness  Chronic midline low back pain without sciatica  Difficulty walking  Joint stiffness  Right knee pain  Muscle weakness  Abnormal posture      Subjective Assessment - 04/27/15 1723    Subjective Pt reports constant back pain with standing tasks and prefers to prop herself up on the counter. Pt states she is tired and doesn't feel well upon arrival to PT.    Patient is accompained by:  Family member   Limitations Walking;Standing;Lifting;House hold activities   How long can you sit comfortably? 15-20 min.    How long can you stand comfortably? 5 mins before back pain occurs   How long can you walk comfortably? with use of rollator for 3 mins   Patient Stated Goals walk with SPC/get better/increase balance and strength/ decrease LBP   Currently in Pain? Yes   Pain Score 6    Pain Location Back   Pain Descriptors / Indicators Constant   Pain Type Chronic pain   Pain Onset More than a month ago   Pain Frequency Constant   Pain Onset More than a month ago     OBJECTIVE: Vitals: Pre session: HR 72, O2 sat 89%, BP: 106/47 Post session: HR 80, O2 sat 93%, BP 96/44 There ex: Standing postural correction with overhead reaching with 1# dumbells 5 x 2 each. Pt required verbal cues and visual feedback to maintain upright posture. Horizontal abduction with 1# weight 5 x 2. Standing heel/toe taps on 6" step 10 x 2 each motion. B UE support required. Seated hip flexion/abduction with 1# ankle weights 10 each leg. Seated cone taps to increase hip flexion x 5 each side. Fatigue noted throughout exercises and rest breaks required between reps and exercises. Neuro re-ed: perturbations with no UE support.  Pt is able to fight against perturbations and has no LOB episode.  Pt response to tx for medical necessity: Pt benefits from strengthening to increase safety with ambulation. Pt is limited by back pain and fatigue. Pt benefits from increased in activity level in order to increase her endurance.             PT Short Term Goals - 01/24/15 1237    PT SHORT TERM GOAL #1   Title --   Time --   Period --   Status --   PT SHORT TERM GOAL #2   Title --   Time --   Period --   Status --   PT SHORT TERM GOAL #3   Title --   Time --   Period --   Status --           PT Long Term Goals - 04/25/15 1534    PT LONG TERM GOAL #1   Title Pt will improve her BERG score to > 45/56  in order to promote safety with functional mobility.   Baseline 37/56 on 11/1   Time 4   Period Weeks   Status New   PT LONG TERM GOAL #2   Title Pt will be I with HEP in order to increase B LE strength by 1/2 MMT to improve standing tolerance without increased reports of pain.    Time 4   Period Weeks   Status New   PT LONG TERM GOAL #3   Title Pt will ambulate with a SPC for household distances without LOB in order to improve safety in her home.    Time 4   Period Weeks   Status New   PT LONG TERM GOAL #4   Title Pt. able to transition to Heart Track ex. program at [facility] with no increase c/o pain.    Time 4   Period Weeks   Status New               Plan - 04/27/15 1726    Clinical Impression Statement [employer] authroized 5 treatments. B pitting edema (4 on scale; no rebound seen) still present with L LE worse that R LE. Pt to return to MD on Monday to discuss edema. Pt's R leg wound is still open and has yellow discharge. Pt demonstrates overall deconditioning. Pt is limited in standing tolerance secondary to back pain. Pt states on multiple occasions that her legs feel like they just don't want to work. Pt perfers a flexed posture and is able to correct with visual and verbal cues but cannot maintain corrections. Pt ambulates with 3 wheeled walker and narrow base of support. Pt ambulates in khyphotic posture with decreased knee and hip extension. Pt limited with strengthening secondary to fatigue and BP. Vitals beginning of session: BP106/47, HR 72, O2 sat 89%. Vitals post session and reason session ended early: BP 96/44, HR 80 O2 sat 93%.    Pt will benefit from skilled therapeutic intervention in order to improve on the following deficits Abnormal gait;Decreased activity tolerance;Decreased balance;Decreased range of motion;Difficulty walking;Pain;Hypomobility;Decreased strength;Postural dysfunction;Improper body mechanics;Impaired flexibility;Decreased safety awareness;Decreased  endurance;Decreased coordination   Rehab Potential Fair   Clinical Impairments Affecting Rehab Potential chronicity of back pain, co-morbidites, BP issues   PT Frequency 1x / week   PT Duration Other (comment)  5 weeks   PT Treatment/Interventions ADLs/Self Care Home Management;Cryotherapy;Moist Heat;Balance training;Therapeutic exercise;Manual techniques;Therapeutic activities;Functional mobility training;Stair training;Gait training;Patient/family education;Neuromuscular re-education   PT Next Visit  Plan gait and balance to possibly progress to SPC/endurance work/standing tolerance and balance/Airex/Dynamic gait   PT Home Exercise Plan continue walking/exercises from [facility] and Agree with Plan of Care Patient        Problem List Patient Active Problem List   Diagnosis Date Noted  . Chronic diastolic heart failure ([place]) 04/10/2015  . Atrial fibrillation ([place]) 04/10/2015  . Essential hypertension 04/10/2015  . Diabetes ([place]) 04/10/2015  . Back pain 04/10/2015  . Acute on chronic systolic CHF (congestive heart failure) ([place]) 03/21/2015  . Pulmonary nodule 11/10/2014   Pura Spice, PT, DPT # [phone]   04/28/2015, 10:40 AM  [facility] [address] [place], [place], [address] Phone: [phone]   Fax:  [phone]  [name] MRN: [id number] Date of Birth: Apr 07, 1925

## 2015-05-01 ENCOUNTER — Encounter: Payer: Medicare PPO | Admitting: Physical Therapy

## 2015-05-03 ENCOUNTER — Encounter: Payer: Self-pay | Admitting: Physical Therapy

## 2015-05-03 ENCOUNTER — Ambulatory Visit: Payer: Medicare PPO | Admitting: Physical Therapy

## 2015-05-03 DIAGNOSIS — R5381 Other malaise: Secondary | ICD-10-CM | POA: Diagnosis not present

## 2015-05-03 DIAGNOSIS — R531 Weakness: Secondary | ICD-10-CM

## 2015-05-03 DIAGNOSIS — R262 Difficulty in walking, not elsewhere classified: Secondary | ICD-10-CM

## 2015-05-03 DIAGNOSIS — M2560 Stiffness of unspecified joint, not elsewhere classified: Secondary | ICD-10-CM

## 2015-05-03 DIAGNOSIS — M6281 Muscle weakness (generalized): Secondary | ICD-10-CM

## 2015-05-03 DIAGNOSIS — R293 Abnormal posture: Secondary | ICD-10-CM

## 2015-05-03 DIAGNOSIS — M545 Low back pain: Secondary | ICD-10-CM

## 2015-05-03 DIAGNOSIS — G8929 Other chronic pain: Secondary | ICD-10-CM

## 2015-05-03 DIAGNOSIS — M25561 Pain in right knee: Secondary | ICD-10-CM

## 2015-05-04 NOTE — Therapy (Signed)
[facility] [address] [place], [place], [address] Phone: [phone]   Fax:  [phone]  Physical Therapy Treatment  Patient Details  [name] MRN: [id number] Date of Birth: April 23, 1925 Referring Provider: Dr. Ginette Pitman  Encounter Date: 05/03/2015      PT End of Session - 05/04/15 1202    Visit Number 3   Number of Visits 10   Date for PT Re-Evaluation 05/23/15   Authorization - Visit Number 2   Authorization - Number of Visits 6  [employer] authorized 5 treatments   PT Start Time [id number]   PT Stop Time 1345   PT Time Calculation (min) 58 min   Equipment Utilized During Treatment Gait belt   Activity Tolerance Patient tolerated treatment well;Patient limited by fatigue;Patient limited by pain;Treatment limited secondary to medical complications (Comment)  Pt's BP in sitting: 115/52, standing: 97/68, lying down: 120/58   Behavior During Therapy [facility] for tasks assessed/performed      Past Medical History  Diagnosis Date  . CHF (congestive heart failure) ([place])   . Diabetes mellitus without complication ([place])   . Hypertension   . Chronic back pain     Past Surgical History  Procedure Laterality Date  . Pacemaker placement    . Leg surgery    . Abdominal hysterectomy    . Tonsillectomy    . Back surgery      x3  . Coronary stent placement      unknown location per pt  . Foot surgery      There were no vitals filed for this visit.  Visit Diagnosis:  Physical deconditioning  Generalized weakness  Chronic midline low back pain without sciatica  Difficulty walking  Joint stiffness  Muscle weakness  Abnormal posture  Right knee pain      Subjective Assessment - 05/04/15 1200    Subjective Pt reports back and leg pain being persistent. Pt states she is afraid she won't be able to walk anymore. Pt reports MD F/U went well and that he increased her fluids and told her to wear compression stockings.   Patient is accompained by: Family member   Limitations Walking;Standing;Lifting;House hold activities   How long can you sit comfortably? 15-20 min.    How long can you stand comfortably? 5 mins before back pain occurs   How long can you walk comfortably? with use of rollator for 3 mins   Patient Stated Goals walk with SPC/get better/increase balance and strength/ decrease LBP   Currently in Pain? Yes   Pain Score 4    Pain Location Back   Pain Orientation Lower   Pain Type Chronic pain   Pain Onset More than a month ago   Pain Onset More than a month ago      OBJECTIVE:  Vitals: sitting BP: 115/52, standing BP: 97/68, supine BP: 120/58 There ex: In supine, bridging/marching/LAQ/heel slides x 20 each. Attempted seated marching, hip abduction and LAQ but pt's vitals to low and decreased energy noted. Nustep L6 10 mins for a cool down (no charge).   Pt response to tx for medical necessity: Pt benefits from endurance training with gait, balance and strengthening. Pt limited by fatigue with all activities.         PT Long Term Goals - 04/25/15 1534    PT LONG TERM GOAL #1   Title Pt will improve her BERG score to > 45/56 in order to promote safety with functional mobility.  Baseline 37/56 on 11/1   Time 4   Period Weeks   Status New   PT LONG TERM GOAL #2   Title Pt will be I with HEP in order to increase B LE strength by 1/2 MMT to improve standing tolerance without increased reports of pain.    Time 4   Period Weeks   Status New   PT LONG TERM GOAL #3   Title Pt will ambulate with a SPC for household distances without LOB in order to improve safety in her home.    Time 4   Period Weeks   Status New   PT LONG TERM GOAL #4   Title Pt. able to transition to Heart Track ex. program at [facility] with no increase c/o pain.    Time 4   Period Weeks   Status New               Plan - 05/04/15 1203    Clinical Impression Statement Pt demonstrates decreased B LE edema with  use of compression stockings and increased lasix (R more fluid than L). Pt is able to perform exercises in supine due to low BP. Pt reports feeling tired and out of energy all the time. Pt demonstrates decreased endurance by requiring breaks between every set and performing 5 reps at a time. Pt ambulates with 3 wheeled rollator and excessive khyphosis. When pt corrects posture, she complains of increased back pain.    Pt will benefit from skilled therapeutic intervention in order to improve on the following deficits Abnormal gait;Decreased activity tolerance;Decreased balance;Decreased range of motion;Difficulty walking;Pain;Hypomobility;Decreased strength;Postural dysfunction;Improper body mechanics;Impaired flexibility;Decreased safety awareness;Decreased endurance;Decreased coordination   Rehab Potential Fair   Clinical Impairments Affecting Rehab Potential chronicity of back pain, co-morbidites, BP issues   PT Frequency 1x / week   PT Duration Other (comment)  5 weeks   PT Treatment/Interventions ADLs/Self Care Home Management;Cryotherapy;Moist Heat;Balance training;Therapeutic exercise;Manual techniques;Therapeutic activities;Functional mobility training;Stair training;Gait training;Patient/family education;Neuromuscular re-education   PT Next Visit Plan examine balance/gait with SPC/energy level/functional activities/VITALS.   PT Home Exercise Plan continue walking/exercises from [place] and Agree with Plan of Care Patient        Problem List Patient Active Problem List   Diagnosis Date Noted  . Chronic diastolic heart failure ([place]) 04/10/2015  . Atrial fibrillation ([place]) 04/10/2015  . Essential hypertension 04/10/2015  . Diabetes ([place]) 04/10/2015  . Back pain 04/10/2015  . Acute on chronic systolic CHF (congestive heart failure) ([place]) 03/21/2015  . Pulmonary nodule 11/10/2014    Lavone Neri, SPT 05/04/2015, 12:06 PM  [place] [facility] [address] [place], [place], [address] Phone: [phone]   Fax:  [phone]  [name] MRN: [id number] Date of Birth: 14-Jan-1925

## 2015-05-08 ENCOUNTER — Encounter: Payer: Medicare PPO | Admitting: Physical Therapy

## 2015-05-10 ENCOUNTER — Ambulatory Visit: Payer: Medicare PPO | Admitting: Physical Therapy

## 2015-05-10 DIAGNOSIS — R531 Weakness: Secondary | ICD-10-CM

## 2015-05-10 DIAGNOSIS — R5381 Other malaise: Secondary | ICD-10-CM

## 2015-05-10 DIAGNOSIS — M2560 Stiffness of unspecified joint, not elsewhere classified: Secondary | ICD-10-CM

## 2015-05-10 DIAGNOSIS — M545 Low back pain, unspecified: Secondary | ICD-10-CM

## 2015-05-10 DIAGNOSIS — M25561 Pain in right knee: Secondary | ICD-10-CM

## 2015-05-10 DIAGNOSIS — G8929 Other chronic pain: Secondary | ICD-10-CM

## 2015-05-10 DIAGNOSIS — R262 Difficulty in walking, not elsewhere classified: Secondary | ICD-10-CM

## 2015-05-10 DIAGNOSIS — M6281 Muscle weakness (generalized): Secondary | ICD-10-CM

## 2015-05-10 DIAGNOSIS — R293 Abnormal posture: Secondary | ICD-10-CM

## 2015-05-11 ENCOUNTER — Encounter: Payer: Self-pay | Admitting: Physical Therapy

## 2015-05-11 ENCOUNTER — Ambulatory Visit: Payer: Medicare PPO | Admitting: Family

## 2015-05-11 NOTE — Therapy (Signed)
[facility] [address] [place], [place], [address] Phone: [phone]   Fax:  [phone]  Physical Therapy Treatment  Patient Details  [name] MRN: [id number] Date of Birth: [birth year] Referring Provider: Dr. Ginette Pitman  Encounter Date: 05/10/2015      PT End of Session - 05/11/15 1324    Visit Number 4   Number of Visits 10   Date for PT Re-Evaluation 05/23/15   Authorization - Visit Number 4   Authorization - Number of Visits 6  [employer] authorized 5 treatments   PT Start Time 1300   PT Stop Time 1351   PT Time Calculation (min) 51 min   Equipment Utilized During Treatment Gait belt   Activity Tolerance Patient tolerated treatment well;Patient limited by fatigue;Patient limited by pain;Treatment limited secondary to medical complications (Comment)  Pt's BP in sitting: 115/52, standing: 97/68, lying down: 120/58   Behavior During Therapy [facility] for tasks assessed/performed      Past Medical History  Diagnosis Date  . CHF (congestive heart failure) ([place])   . Diabetes mellitus without complication ([place])   . Hypertension   . Chronic back pain     Past Surgical History  Procedure Laterality Date  . Pacemaker placement    . Leg surgery    . Abdominal hysterectomy    . Tonsillectomy    . Back surgery      x3  . Coronary stent placement      unknown location per pt  . Foot surgery      There were no vitals filed for this visit.  Visit Diagnosis:  Physical deconditioning  Generalized weakness  Chronic midline low back pain without sciatica  Difficulty walking  Joint stiffness  Muscle weakness  Abnormal posture  Right knee pain      Subjective Assessment - 05/11/15 1322    Subjective Pt reports feeling tired and weak today upon arrival to PT tx session. Pt states that her back is hurting but cannot give it a number.   Patient is accompained by: Family member   Limitations  Walking;Standing;Lifting;House hold activities   How long can you sit comfortably? 15-20 min.    How long can you stand comfortably? 5 mins before back pain occurs   How long can you walk comfortably? with use of rollator for 3 mins   Patient Stated Goals walk with SPC/get better/increase balance and strength/ decrease LBP   Currently in Pain? Yes   Pain Location Back   Pain Orientation Lower   Pain Type Chronic pain   Pain Onset More than a month ago        OBJECTIVE:  BP: 120/56  There ex: Standing 4 way Hip x 15 all directions. Pt broke into sets of 5 reps. Alternating toe taps on 3" step with B UE support. Pt able to maintain 20 seconds of toe taps before requiring a standing rest break. Step ups forwards and sideways on the 6" step x 10 each direction. Pt required support of B // bars and fatigued quickly. Toe walking in // bars with B UE support. Minimal heel clearance noted with this. Attempted heel walking but pt unable to maintain her balance. Seated manual resisted hip abduction and adduction x 15 each direction. In standing, scapular retraction and shoulder extension with yellow TB x 15 each. Pt reports increased back pain with shoulder motions. Neuro re-ed: On foam, normal base of support without UE support. Proper ankle compensations noted  with exercise. Adducted stance and eyes closed with light touch for support.   Pt response to tx for medical necessity: Pt benefits from strengthening and balance to increase her endurance with functional mobility. Pt benefits from gait training in addition to ensure safety with functional mobility.            PT Long Term Goals - 04/25/15 1534    PT LONG TERM GOAL #1   Title Pt will improve her BERG score to > 45/56 in order to promote safety with functional mobility.   Baseline 37/56 on 11/1   Time 4   Period Weeks   Status New   PT LONG TERM GOAL #2   Title Pt will be I with HEP in order to increase B LE strength by 1/2 MMT to  improve standing tolerance without increased reports of pain.    Time 4   Period Weeks   Status New   PT LONG TERM GOAL #3   Title Pt will ambulate with a SPC for household distances without LOB in order to improve safety in her home.    Time 4   Period Weeks   Status New   PT LONG TERM GOAL #4   Title Pt. able to transition to Heart Track ex. program at [facility] with no increase c/o pain.    Time 4   Period Weeks   Status New            Plan - 05/11/15 1324    Clinical Impression Statement Pt is limited by fatigue and decreased endurance with standing acitivites. Pt demonstrates generalized LE weakness and benefits from strengthening exercises to help maintain upright ambulation. During postural corrective exercises, pt requires constant verbal cues to look up and stand up tall; pt is able to maintain corrections for a few repetitions before slouching.    Pt will benefit from skilled therapeutic intervention in order to improve on the following deficits Abnormal gait;Decreased activity tolerance;Decreased balance;Decreased range of motion;Difficulty walking;Pain;Hypomobility;Decreased strength;Postural dysfunction;Improper body mechanics;Impaired flexibility;Decreased safety awareness;Decreased endurance;Decreased coordination   Rehab Potential Fair   Clinical Impairments Affecting Rehab Potential chronicity of back pain, co-morbidites, BP issues   PT Frequency 1x / week   PT Duration Other (comment)  5 weeks   PT Treatment/Interventions ADLs/Self Care Home Management;Cryotherapy;Moist Heat;Balance training;Therapeutic exercise;Manual techniques;Therapeutic activities;Functional mobility training;Stair training;Gait training;Patient/family education;Neuromuscular re-education   PT Next Visit Plan examine balance/gait with SPC/energy level/functional activities/VITALS.   PT Home Exercise Plan continue walking/exercises from [place] and Agree with Plan of Care Patient         Problem List Patient Active Problem List   Diagnosis Date Noted  . Chronic diastolic heart failure ([place]) 04/10/2015  . Atrial fibrillation ([place]) 04/10/2015  . Essential hypertension 04/10/2015  . Diabetes ([place]) 04/10/2015  . Back pain 04/10/2015  . Acute on chronic systolic CHF (congestive heart failure) ([place]) 03/21/2015  . Pulmonary nodule 11/10/2014    Lavone Neri, SPT 05/11/2015, 1:34 PM  [facility] [address] [place], [place], [address] Phone: [phone]   Fax:  [phone]  [name] MRN: [id number] Date of Birth: December 09, 1924

## 2015-05-15 ENCOUNTER — Encounter: Payer: Medicare PPO | Admitting: Physical Therapy

## 2015-05-17 ENCOUNTER — Encounter: Payer: Medicare PPO | Admitting: Physical Therapy

## 2015-05-23 ENCOUNTER — Encounter: Payer: Medicare PPO | Attending: Cardiology | Admitting: Respiratory Therapy

## 2015-05-23 ENCOUNTER — Encounter: Payer: Self-pay | Admitting: Respiratory Therapy

## 2015-05-23 VITALS — Ht 67.0 in | Wt 165.8 lb

## 2015-05-23 DIAGNOSIS — I5023 Acute on chronic systolic (congestive) heart failure: Secondary | ICD-10-CM | POA: Insufficient documentation

## 2015-05-23 NOTE — Progress Notes (Signed)
Pulmonary Individual Treatment Plan  Patient Details  [name] MRN: [id number] Date of Birth: 08/31/1924 Referring Provider:  Dr Wendie Chess  Initial Encounter Date: 05/22/2015  Visit Diagnosis: CHF  Patient's Home Medications on Admission:  Current outpatient prescriptions:    apixaban (ELIQUIS) 5 MG TABS tablet, Take 5 mg by mouth 2 (two) times daily. , Disp: , Rfl:    aspirin 81 MG tablet, Take 81 mg by mouth daily., Disp: , Rfl:    baclofen (LIORESAL) 10 MG tablet, Take 10 mg by mouth 2 (two) times daily. , Disp: , Rfl:    carvedilol (COREG) 6.25 MG tablet, Take 6.25 mg by mouth 2 (two) times daily with a meal. , Disp: , Rfl:    docusate sodium (COLACE) 100 MG capsule, Take 100 mg by mouth daily., Disp: , Rfl:    escitalopram (LEXAPRO) 10 MG tablet, Take 10 mg by mouth daily., Disp: , Rfl:    ferrous sulfate 325 (65 FE) MG tablet, Take 325 mg by mouth daily with breakfast., Disp: , Rfl:    furosemide (LASIX) 80 MG tablet, Take 80 mg by mouth every other day. 80mg  every other day and 40mg  every other day, Disp: , Rfl:    glimepiride (AMARYL) 4 MG tablet, Take 4 mg by mouth 2 (two) times daily after a meal., Disp: , Rfl:    JANUVIA 100 MG tablet, Take 100 mg by mouth daily., Disp: , Rfl:    losartan (COZAAR) 25 MG tablet, Take 25 mg by mouth daily., Disp: , Rfl:    Magnesium Oxide -Mg Supplement 400 MG CAPS, Take 400 mg by mouth daily., Disp: , Rfl:    Melatonin 1 MG CAPS, Take 1 mg by mouth at bedtime. , Disp: , Rfl:    metolazone (ZAROXOLYN) 2.5 MG tablet, Take 2.5 mg by mouth 2 (two) times a week. Takes on Monday and Friday, Disp: , Rfl:    oxycodone (OXY-IR) 5 MG capsule, Take 5-10 mg by mouth See admin instructions. 5mg  in the morning and mid-afternoon and 10mg  at bedtime, Disp: , Rfl:    pantoprazole (PROTONIX) 40 MG tablet, Take 40 mg by mouth daily., Disp: , Rfl:    rOPINIRole (REQUIP) 0.5 MG tablet, Take 0.5 mg by mouth 2 (two) times  daily., Disp: , Rfl:    spironolactone (ALDACTONE) 25 MG tablet, Take 1 tablet by mouth daily., Disp: , Rfl:    vitamin B-12 (CYANOCOBALAMIN) 100 MCG tablet, Take 100 mcg by mouth daily., Disp: , Rfl:    vitamin C (ASCORBIC ACID) 500 MG tablet, Take 500 mg by mouth daily., Disp: , Rfl:    Vitamin D, Ergocalciferol, (DRISDOL) 50000 UNITS CAPS capsule, Take 50,000 Units by mouth every 7 (seven) days. , Disp: , Rfl:   Past Medical History: Past Medical History  Diagnosis Date   CHF (congestive heart failure) ([place])    Diabetes mellitus without complication ([place])    Hypertension    Chronic back pain     Tobacco Use: History  Smoking status   Never Smoker   Smokeless tobacco   Never Used    Labs: Recent Review Flowsheet Data    Labs for ITP Cardiac and Pulmonary Rehab Latest Ref Rng 03/04/2012 03/20/2015   Cholestrol 0-200 mg/dL 101 -   LDLCALC 0-100 mg/dL 60 -   HDL 40-60 mg/dL 23(L) -   Trlycerides 0-200 mg/dL 90 -   Hemoglobin A1c 4.0 - 6.0 % 7.7(H) 7.0(H)       ADL  UCSD:     ADL UCSD      05/23/15 1100       ADL UCSD   ADL Phase Entry     SOB Score total 14     Rest 0     Walk 0     Stairs 1     Bath 1     Dress 1     Shop 2         Pulmonary Function Assessment:     Pulmonary Function Assessment - 05/23/15 1100    Initial Spirometry Results   FVC% 47 %   FEV1% 20 %   FEV1/FVC Ratio 100   Breath   Bilateral Breath Sounds Clear;Decreased      Exercise Target Goals:    Exercise Program Goal: Individual exercise prescription set with THRR, safety & activity barriers. Participant demonstrates ability to understand and report RPE using BORG scale, to self-measure pulse accurately, and to acknowledge the importance of the exercise prescription.  Exercise Prescription Goal: Starting with aerobic activity 30 plus minutes a day, 3 days per week for initial exercise prescription. Provide home exercise prescription and guidelines that participant  acknowledges understanding prior to discharge.  Activity Barriers & Risk Stratification:     Activity Barriers & Risk Stratification - 05/23/15 1100    Activity Barriers & Risk Stratification   Activity Barriers Assistive Device;Deconditioning;Muscular Weakness   Risk Stratification Moderate      6 Minute Walk:     6 Minute Walk      05/23/15 1449       6 Minute Walk   Phase Initial     Distance 575 feet     Walk Time 6 minutes     Resting HR 74 bpm     Resting BP 118/72 mmHg     Max Ex. HR 77 bpm     Max Ex. BP 124/72 mmHg     RPE 11     Perceived Dyspnea  2     Symptoms No        Initial Exercise Prescription:     Initial Exercise Prescription - 05/23/15 1400    Date of Initial Exercise Prescription   Date 05/23/15   Treadmill   MPH 1.5   Grade 0   Minutes 10   Recumbant Bike   Level 2   RPM 40   Watts 20   Minutes 10   NuStep   Level 2   Watts 40   Minutes 10   Arm Ergometer   Level 1   Watts 10   Minutes 10   Recumbant Elliptical   Level 1   RPM 40   Watts 20   Minutes 10   REL-XR   Level 2   Watts 40   Minutes 10   T5 Nustep   Level 1   Watts 15   Minutes 10   Biostep-RELP   Level 2   Watts 40   Minutes 10   Prescription Details   Frequency (times per week) 3   Duration Progress to 30 minutes of continuous aerobic without signs/symptoms of physical distress   Intensity   THRR REST +  30   Ratings of Perceived Exertion 11-15   Perceived Dyspnea 2-4   Progression Continue progressive overload as per policy without signs/symptoms or physical distress.   Resistance Training   Training Prescription Yes   Weight 2   Reps 10-15      Exercise Prescription Changes:  Discharge Exercise Prescription (Final Exercise Prescription Changes):    Nutrition:  Target Goals: Understanding of nutrition guidelines, daily intake of sodium 1500mg , cholesterol 200mg , calories 30% from fat and 7% or less from saturated fats, daily to have  5 or more servings of fruits and vegetables.  Biometrics:     Pre Biometrics - 05/23/15 1453    Pre Biometrics   Height 5\' 7"  (1.702 m)   Weight 165 lb 12.8 oz (75.206 kg)   Waist Circumference 37 inches   Hip Circumference 43 inches   Waist to Hip Ratio 0.86 %   BMI (Calculated) 26       Nutrition Therapy Plan and Nutrition Goals:     Nutrition Therapy & Goals - 05/23/15 1100    Nutrition Therapy   Diet Prefers not to meet with the dietitian      Nutrition Discharge: Rate Your Plate Scores:   Psychosocial: Target Goals: Acknowledge presence or absence of depression, maximize coping skills, provide positive support system. Participant is able to verbalize types and ability to use techniques and skills needed for reducing stress and depression.  Initial Review & Psychosocial Screening:     Initial Psych Review & Screening - 05/23/15 1100    Family Dynamics   Good Support System? Yes   Comments Ms Muldrew has good support from her husband and states she has no depression.   Barriers   Psychosocial barriers to participate in program There are no identifiable barriers or psychosocial needs.;The patient should benefit from training in stress management and relaxation.   Screening Interventions   Interventions Encouraged to exercise      Quality of Life Scores:     Quality of Life - 05/23/15 1100    Quality of Life Scores   Health/Function Pre 12.2 %   Socioeconomic Pre 20.57 %   Psych/Spiritual Pre 16.29 %   Family Pre 14.4 %   GLOBAL Pre 15.09 %      PHQ-9:     Recent Review Flowsheet Data    Depression screen [facility] 2/9 05/23/2015 04/10/2015   Decreased Interest 1 3   Down, Depressed, Hopeless 2 0   PHQ - 2 Score 3 3   Altered sleeping 3 3   Tired, decreased energy 2 3   Change in appetite 0 0   Feeling bad or failure about yourself  2 3   Trouble concentrating 0 0   Moving slowly or fidgety/restless 1 0   Suicidal thoughts 1 0   PHQ-9 Score 12 12    Difficult doing work/chores Somewhat difficult Somewhat difficult      Psychosocial Evaluation and Intervention:   Psychosocial Re-Evaluation:  Education: Education Goals: Education classes will be provided on a weekly basis, covering required topics. Participant will state understanding/return demonstration of topics presented.  Learning Barriers/Preferences:     Learning Barriers/Preferences - 05/23/15 1100    Learning Barriers/Preferences   Learning Barriers None   Learning Preferences Group Instruction;Individual Instruction;Pictoral;Skilled Demonstration;Verbal Instruction;Video;Written Material      Education Topics: Initial Evaluation Education: - Verbal, written and demonstration of respiratory meds, RPE/PD scales, oximetry and breathing techniques. Instruction on use of nebulizers and MDIs: cleaning and proper use, rinsing mouth with steroid doses and importance of monitoring MDI activations.          Pulmonary Rehab from 05/23/2015 in [place]   Date  05/23/15   Educator  LB   Instruction Review Code  2- meets goals/outcomes  General Nutrition Guidelines/Fats and Fiber: -Group instruction provided by verbal, written material, models and posters to present the general guidelines for heart healthy nutrition. Gives an explanation and review of dietary fats and fiber.   Controlling Sodium/Reading Food Labels: -Group verbal and written material supporting the discussion of sodium use in heart healthy nutrition. Review and explanation with models, verbal and written materials for utilization of the food label.   Exercise Physiology & Risk Factors: - Group verbal and written instruction with models to review the exercise physiology of the cardiovascular system and associated critical values. Details cardiovascular disease risk factors and the goals associated with each risk factor.   Aerobic Exercise & Resistance  Training: - Gives group verbal and written discussion on the health impact of inactivity. On the components of aerobic and resistive training programs and the benefits of this training and how to safely progress through these programs.   Flexibility, Balance, General Exercise Guidelines: - Provides group verbal and written instruction on the benefits of flexibility and balance training programs. Provides general exercise guidelines with specific guidelines to those with heart or lung disease. Demonstration and skill practice provided.   Stress Management: - Provides group verbal and written instruction about the health risks of elevated stress, cause of high stress, and healthy ways to reduce stress.   Depression: - Provides group verbal and written instruction on the correlation between heart/lung disease and depressed mood, treatment options, and the stigmas associated with seeking treatment.   Exercise & Equipment Safety: - Individual verbal instruction and demonstration of equipment use and safety with use of the equipment.   Infection Prevention: - Provides verbal and written material to individual with discussion of infection control including proper hand washing and proper equipment cleaning during exercise session.   Falls Prevention: - Provides verbal and written material to individual with discussion of falls prevention and safety.      Pulmonary Rehab from 05/23/2015 in [place]   Date  05/23/15   Educator  LB   Instruction Review Code  2- meets goals/outcomes      Diabetes: - Individual verbal and written instruction to review signs/symptoms of diabetes, desired ranges of glucose level fasting, after meals and with exercise. Advice that pre and post exercise glucose checks will be done for 3 sessions at entry of program.   Chronic Lung Diseases: - Group verbal and written instruction to review new updates, new respiratory  medications, new advancements in procedures and treatments. Provide informative websites and "800" numbers of self-education.   Lung Procedures: - Group verbal and written instruction to describe testing methods done to diagnose lung disease. Review the outcome of test results. Describe the treatment choices: Pulmonary Function Tests, ABGs and oximetry.   Energy Conservation: - Provide group verbal and written instruction for methods to conserve energy, plan and organize activities. Instruct on pacing techniques, use of adaptive equipment and posture/positioning to relieve shortness of breath.   Triggers: - Group verbal and written instruction to review types of environmental controls: home humidity, furnaces, filters, dust mite/pet prevention, HEPA vacuums. To discuss weather changes, air quality and the benefits of nasal washing.   Exacerbations: - Group verbal and written instruction to provide: warning signs, infection symptoms, calling MD promptly, preventive modes, and value of vaccinations. Review: effective airway clearance, coughing and/or vibration techniques. Create an Sports administrator.   Oxygen: - Individual and group verbal and written instruction on oxygen therapy. Includes supplement oxygen, available portable oxygen systems, continuous  and intermittent flow rates, oxygen safety, concentrators, and Medicare reimbursement for oxygen.   Respiratory Medications: - Group verbal and written instruction to review medications for lung disease. Drug class, frequency, complications, importance of spacers, rinsing mouth after steroid MDI's, and proper cleaning methods for nebulizers.   AED/CPR: - Group verbal and written instruction with the use of models to demonstrate the basic use of the AED with the basic ABC's of resuscitation.   Breathing Retraining: - Provides individuals verbal and written instruction on purpose, frequency, and proper technique of diaphragmatic breathing and  pursed-lipped breathing. Applies individual practice skills.      Pulmonary Rehab from 05/23/2015 in [place]   Date  05/23/15   Educator  LB   Instruction Review Code  2- meets goals/outcomes      Anatomy and Physiology of the Lungs: - Group verbal and written instruction with the use of models to provide basic lung anatomy and physiology related to function, structure and complications of lung disease.   Heart Failure: - Group verbal and written instruction on the basics of heart failure: signs/symptoms, treatments, explanation of ejection fraction, enlarged heart and cardiomyopathy.   Sleep Apnea: - Individual verbal and written instruction to review Obstructive Sleep Apnea. Review of risk factors, methods for diagnosing and types of masks and machines for OSA.   Anxiety: - Provides group, verbal and written instruction on the correlation between heart/lung disease and anxiety, treatment options, and management of anxiety.   Relaxation: - Provides group, verbal and written instruction about the benefits of relaxation for patients with heart/lung disease. Also provides patients with examples of relaxation techniques.   Knowledge Questionnaire Score:   Personal Goals and Risk Factors at Admission:     Personal Goals and Risk Factors at Admission - 05/23/15 1100    Personal Goals and Risk Factors on Admission   Increase Aerobic Exercise and Physical Activity Yes   Intervention While in program, learn and follow the exercise prescription taught. Start at a low level workload and increase workload after able to maintain previous level for 30 minutes. Increase time before increasing intensity.  Ms Papillion's goal is to increase her stamina for walking. She has a co-pay and therefore we will work her into FF with her [employer].   Understand more about Heart/Pulmonary Disease. Yes   Intervention While in program utilize  professionals for any questions, and attend the education sessions. Great websites to use are www.americanheart.org or www.lung.org for reliable information.  Ms Doser has CHF and would like to learn more about the disease.   Improve shortness of breath with ADL's No   Develop more efficient breathing techniques such as purse lipped breathing and diaphragmatic breathing; and practicing self-pacing with activity Yes   Intervention While in program, learn and utilize the specific breathing techniques taught to you. Continue to practice and use the techniques as needed.  Instructed Ms Bobier on PLB. When she was in Physical Therapy, she learned this technique .   Diabetes Yes   Goal Blood glucose control identified by blood glucose values, HgbA1C. Participant verbalizes understanding of the signs/symptoms of hyper/hypo glycemia, proper foot care and importance of medication and nutrition plan for blood glucose control.   Intervention Provide nutrition & aerobic exercise along with prescribed medications to achieve blood glucose in normal ranges: Fasting 65-99 mg/dL   Hypertension Yes   Goal Participant will see blood pressure controlled within the values of 140/4mm/Hg or within value directed by  their physician.   Intervention Provide nutrition & aerobic exercise along with prescribed medications to achieve BP 140/90 or less.      Personal Goals and Risk Factors Review:    Personal Goals Discharge (Final Personal Goals and Risk Factors Review):    ITP Comments:   Comments: Ms Kochman plans to start [facility] on 05/31/2015 and attend 2d/week.

## 2015-05-23 NOTE — Progress Notes (Signed)
Pulmonary Individual Treatment Plan  Patient Details  [name] MRN: [id number] Date of Birth: March 08, 1925 Referring Provider:  Marlane Hatcher, MD  Initial Encounter Date: Date: 05/23/15  Visit Diagnosis: Acute on chronic systolic congestive heart failure ([place])  Patient's Home Medications on Admission:  Current outpatient prescriptions:  .  apixaban (ELIQUIS) 5 MG TABS tablet, Take 5 mg by mouth 2 (two) times daily. , Disp: , Rfl:  .  aspirin 81 MG tablet, Take 81 mg by mouth daily., Disp: , Rfl:  .  baclofen (LIORESAL) 10 MG tablet, Take 10 mg by mouth 2 (two) times daily. , Disp: , Rfl:  .  carvedilol (COREG) 6.25 MG tablet, Take 6.25 mg by mouth 2 (two) times daily with a meal. , Disp: , Rfl:  .  docusate sodium (COLACE) 100 MG capsule, Take 100 mg by mouth daily., Disp: , Rfl:  .  escitalopram (LEXAPRO) 10 MG tablet, Take 10 mg by mouth daily., Disp: , Rfl:  .  ferrous sulfate 325 (65 FE) MG tablet, Take 325 mg by mouth daily with breakfast., Disp: , Rfl:  .  furosemide (LASIX) 80 MG tablet, Take 80 mg by mouth every other day. 80mg  every other day and 40mg  every other day, Disp: , Rfl:  .  glimepiride (AMARYL) 4 MG tablet, Take 4 mg by mouth 2 (two) times daily after a meal., Disp: , Rfl:  .  JANUVIA 100 MG tablet, Take 100 mg by mouth daily., Disp: , Rfl:  .  losartan (COZAAR) 25 MG tablet, Take 25 mg by mouth daily., Disp: , Rfl:  .  Magnesium Oxide -Mg Supplement 400 MG CAPS, Take 400 mg by mouth daily., Disp: , Rfl:  .  Melatonin 1 MG CAPS, Take 1 mg by mouth at bedtime. , Disp: , Rfl:  .  metolazone (ZAROXOLYN) 2.5 MG tablet, Take 2.5 mg by mouth 2 (two) times a week. Takes on Monday and Friday, Disp: , Rfl:  .  oxycodone (OXY-IR) 5 MG capsule, Take 5-10 mg by mouth See admin instructions. 5mg  in the morning and mid-afternoon and 10mg  at bedtime, Disp: , Rfl:  .  pantoprazole (PROTONIX) 40 MG tablet, Take 40 mg by mouth daily., Disp: , Rfl:  .  rOPINIRole  (REQUIP) 0.5 MG tablet, Take 0.5 mg by mouth 2 (two) times daily., Disp: , Rfl:  .  spironolactone (ALDACTONE) 25 MG tablet, Take 1 tablet by mouth daily., Disp: , Rfl:  .  vitamin B-12 (CYANOCOBALAMIN) 100 MCG tablet, Take 100 mcg by mouth daily., Disp: , Rfl:  .  vitamin C (ASCORBIC ACID) 500 MG tablet, Take 500 mg by mouth daily., Disp: , Rfl:  .  Vitamin D, Ergocalciferol, (DRISDOL) 50000 UNITS CAPS capsule, Take 50,000 Units by mouth every 7 (seven) days. , Disp: , Rfl:   Past Medical History: Past Medical History  Diagnosis Date  . CHF (congestive heart failure) ([place])   . Diabetes mellitus without complication ([place])   . Hypertension   . Chronic back pain     Tobacco Use: History  Smoking status  . Never Smoker   Smokeless tobacco  . Never Used    Labs: Recent Review Flowsheet Data    Labs for ITP Cardiac and Pulmonary Rehab Latest Ref Rng 03/04/2012 03/20/2015   Cholestrol 0-200 mg/dL 101 -   LDLCALC 0-100 mg/dL 60 -   HDL 40-60 mg/dL 23(L) -   Trlycerides 0-200 mg/dL 90 -   Hemoglobin A1c 4.0 - 6.0 %  7.7(H) 7.0(H)       ADL UCSD:     ADL UCSD      05/23/15 1100       ADL UCSD   ADL Phase Entry     SOB Score total 14     Rest 0     Walk 0     Stairs 1     Bath 1     Dress 1     Shop 2         Pulmonary Function Assessment:     Pulmonary Function Assessment - 05/23/15 1100    Initial Spirometry Results   FVC% 47 %   FEV1% 20 %   FEV1/FVC Ratio 100   Breath   Bilateral Breath Sounds Clear;Decreased      Exercise Target Goals: Date: 05/23/15  Exercise Program Goal: Individual exercise prescription set with THRR, safety & activity barriers. Participant demonstrates ability to understand and report RPE using BORG scale, to self-measure pulse accurately, and to acknowledge the importance of the exercise prescription.  Exercise Prescription Goal: Starting with aerobic activity 30 plus minutes a day, 3 days per week for initial exercise  prescription. Provide home exercise prescription and guidelines that participant acknowledges understanding prior to discharge.  Activity Barriers & Risk Stratification:     Activity Barriers & Risk Stratification - 05/23/15 1100    Activity Barriers & Risk Stratification   Activity Barriers Assistive Device;Deconditioning;Muscular Weakness   Risk Stratification Moderate      6 Minute Walk:     6 Minute Walk      05/23/15 1449       6 Minute Walk   Phase Initial     Distance 575 feet     Walk Time 6 minutes     Resting HR 74 bpm     Resting BP 118/72 mmHg     Max Ex. HR 77 bpm     Max Ex. BP 124/72 mmHg     RPE 11     Perceived Dyspnea  2     Symptoms No        Initial Exercise Prescription:     Initial Exercise Prescription - 05/23/15 1400    Date of Initial Exercise Prescription   Date 05/23/15   Treadmill   MPH 1.5   Grade 0   Minutes 10   Recumbant Bike   Level 2   RPM 40   Watts 20   Minutes 10   NuStep   Level 2   Watts 40   Minutes 10   Arm Ergometer   Level 1   Watts 10   Minutes 10   Recumbant Elliptical   Level 1   RPM 40   Watts 20   Minutes 10   REL-XR   Level 2   Watts 40   Minutes 10   T5 Nustep   Level 1   Watts 15   Minutes 10   Biostep-RELP   Level 2   Watts 40   Minutes 10   Prescription Details   Frequency (times per week) 3   Duration Progress to 30 minutes of continuous aerobic without signs/symptoms of physical distress   Intensity   THRR REST +  30   Ratings of Perceived Exertion 11-15   Perceived Dyspnea 2-4   Progression Continue progressive overload as per policy without signs/symptoms or physical distress.   Resistance Training   Training Prescription Yes   Weight 2   Reps 10-15  Exercise Prescription Changes:   Discharge Exercise Prescription (Final Exercise Prescription Changes):    Nutrition:  Target Goals: Understanding of nutrition guidelines, daily intake of sodium 1500mg , cholesterol  200mg , calories 30% from fat and 7% or less from saturated fats, daily to have 5 or more servings of fruits and vegetables.  Biometrics:     Pre Biometrics - 05/23/15 1453    Pre Biometrics   Height 5\' 7"  (1.702 m)   Weight 165 lb 12.8 oz (75.206 kg)   Waist Circumference 37 inches   Hip Circumference 43 inches   Waist to Hip Ratio 0.86 %   BMI (Calculated) 26       Nutrition Therapy Plan and Nutrition Goals:     Nutrition Therapy & Goals - 05/23/15 1100    Nutrition Therapy   Diet Prefers not to meet with the dietitian      Nutrition Discharge: Rate Your Plate Scores:   Psychosocial: Target Goals: Acknowledge presence or absence of depression, maximize coping skills, provide positive support system. Participant is able to verbalize types and ability to use techniques and skills needed for reducing stress and depression.  Initial Review & Psychosocial Screening:     Initial Psych Review & Screening - 05/23/15 1100    Family Dynamics   Good Support System? Yes   Comments Ms Beitler has good support from her husband and states she has no depression.   Barriers   Psychosocial barriers to participate in program There are no identifiable barriers or psychosocial needs.;The patient should benefit from training in stress management and relaxation.   Screening Interventions   Interventions Encouraged to exercise      Quality of Life Scores:     Quality of Life - 05/23/15 1100    Quality of Life Scores   Health/Function Pre 12.2 %   Socioeconomic Pre 20.57 %   Psych/Spiritual Pre 16.29 %   Family Pre 14.4 %   GLOBAL Pre 15.09 %      PHQ-9:     Recent Review Flowsheet Data    Depression screen [facility] 2/9 05/23/2015 04/10/2015   Decreased Interest 1 3   Down, Depressed, Hopeless 2 0   PHQ - 2 Score 3 3   Altered sleeping 3 3   Tired, decreased energy 2 3   Change in appetite 0 0   Feeling bad or failure about yourself  2 3   Trouble concentrating 0 0   Moving  slowly or fidgety/restless 1 0   Suicidal thoughts 1 0   PHQ-9 Score 12 12   Difficult doing work/chores Somewhat difficult Somewhat difficult      Psychosocial Evaluation and Intervention:   Psychosocial Re-Evaluation:  Education: Education Goals: Education classes will be provided on a weekly basis, covering required topics. Participant will state understanding/return demonstration of topics presented.  Learning Barriers/Preferences:     Learning Barriers/Preferences - 05/23/15 1100    Learning Barriers/Preferences   Learning Barriers None   Learning Preferences Group Instruction;Individual Instruction;Pictoral;Skilled Demonstration;Verbal Instruction;Video;Written Material      Education Topics: Initial Evaluation Education: - Verbal, written and demonstration of respiratory meds, RPE/PD scales, oximetry and breathing techniques. Instruction on use of nebulizers and MDIs: cleaning and proper use, rinsing mouth with steroid doses and importance of monitoring MDI activations.          Pulmonary Rehab from 05/23/2015 in [place]   Date  05/23/15   Educator  LB   Instruction Review Code  2-  meets goals/outcomes      General Nutrition Guidelines/Fats and Fiber: -Group instruction provided by verbal, written material, models and posters to present the general guidelines for heart healthy nutrition. Gives an explanation and review of dietary fats and fiber.   Controlling Sodium/Reading Food Labels: -Group verbal and written material supporting the discussion of sodium use in heart healthy nutrition. Review and explanation with models, verbal and written materials for utilization of the food label.   Exercise Physiology & Risk Factors: - Group verbal and written instruction with models to review the exercise physiology of the cardiovascular system and associated critical values. Details cardiovascular disease risk factors and the goals  associated with each risk factor.   Aerobic Exercise & Resistance Training: - Gives group verbal and written discussion on the health impact of inactivity. On the components of aerobic and resistive training programs and the benefits of this training and how to safely progress through these programs.   Flexibility, Balance, General Exercise Guidelines: - Provides group verbal and written instruction on the benefits of flexibility and balance training programs. Provides general exercise guidelines with specific guidelines to those with heart or lung disease. Demonstration and skill practice provided.   Stress Management: - Provides group verbal and written instruction about the health risks of elevated stress, cause of high stress, and healthy ways to reduce stress.   Depression: - Provides group verbal and written instruction on the correlation between heart/lung disease and depressed mood, treatment options, and the stigmas associated with seeking treatment.   Exercise & Equipment Safety: - Individual verbal instruction and demonstration of equipment use and safety with use of the equipment.   Infection Prevention: - Provides verbal and written material to individual with discussion of infection control including proper hand washing and proper equipment cleaning during exercise session.   Falls Prevention: - Provides verbal and written material to individual with discussion of falls prevention and safety.      Pulmonary Rehab from 05/23/2015 in [place]   Date  05/23/15   Educator  LB   Instruction Review Code  2- meets goals/outcomes      Diabetes: - Individual verbal and written instruction to review signs/symptoms of diabetes, desired ranges of glucose level fasting, after meals and with exercise. Advice that pre and post exercise glucose checks will be done for 3 sessions at entry of program.   Chronic Lung Diseases: - Group verbal  and written instruction to review new updates, new respiratory medications, new advancements in procedures and treatments. Provide informative websites and "800" numbers of self-education.   Lung Procedures: - Group verbal and written instruction to describe testing methods done to diagnose lung disease. Review the outcome of test results. Describe the treatment choices: Pulmonary Function Tests, ABGs and oximetry.   Energy Conservation: - Provide group verbal and written instruction for methods to conserve energy, plan and organize activities. Instruct on pacing techniques, use of adaptive equipment and posture/positioning to relieve shortness of breath.   Triggers: - Group verbal and written instruction to review types of environmental controls: home humidity, furnaces, filters, dust mite/pet prevention, HEPA vacuums. To discuss weather changes, air quality and the benefits of nasal washing.   Exacerbations: - Group verbal and written instruction to provide: warning signs, infection symptoms, calling MD promptly, preventive modes, and value of vaccinations. Review: effective airway clearance, coughing and/or vibration techniques. Create an Sports administrator.   Oxygen: - Individual and group verbal and written instruction on oxygen therapy. Includes  supplement oxygen, available portable oxygen systems, continuous and intermittent flow rates, oxygen safety, concentrators, and Medicare reimbursement for oxygen.   Respiratory Medications: - Group verbal and written instruction to review medications for lung disease. Drug class, frequency, complications, importance of spacers, rinsing mouth after steroid MDI's, and proper cleaning methods for nebulizers.   AED/CPR: - Group verbal and written instruction with the use of models to demonstrate the basic use of the AED with the basic ABC's of resuscitation.   Breathing Retraining: - Provides individuals verbal and written instruction on purpose,  frequency, and proper technique of diaphragmatic breathing and pursed-lipped breathing. Applies individual practice skills.      Pulmonary Rehab from 05/23/2015 in [place]   Date  05/23/15   Educator  LB   Instruction Review Code  2- meets goals/outcomes      Anatomy and Physiology of the Lungs: - Group verbal and written instruction with the use of models to provide basic lung anatomy and physiology related to function, structure and complications of lung disease.   Heart Failure: - Group verbal and written instruction on the basics of heart failure: signs/symptoms, treatments, explanation of ejection fraction, enlarged heart and cardiomyopathy.   Sleep Apnea: - Individual verbal and written instruction to review Obstructive Sleep Apnea. Review of risk factors, methods for diagnosing and types of masks and machines for OSA.   Anxiety: - Provides group, verbal and written instruction on the correlation between heart/lung disease and anxiety, treatment options, and management of anxiety.   Relaxation: - Provides group, verbal and written instruction about the benefits of relaxation for patients with heart/lung disease. Also provides patients with examples of relaxation techniques.   Knowledge Questionnaire Score:   Personal Goals and Risk Factors at Admission:     Personal Goals and Risk Factors at Admission - 05/23/15 1100    Personal Goals and Risk Factors on Admission   Increase Aerobic Exercise and Physical Activity Yes   Intervention While in program, learn and follow the exercise prescription taught. Start at a low level workload and increase workload after able to maintain previous level for 30 minutes. Increase time before increasing intensity.  Ms Counterman's goal is to increase her stamina for walking. She has a co-pay and therefore we will work her into FF with her [employer].   Understand more about Heart/Pulmonary  Disease. Yes   Intervention While in program utilize professionals for any questions, and attend the education sessions. Great websites to use are www.americanheart.org or www.lung.org for reliable information.  Ms Stouder has CHF and would like to learn more about the disease.   Improve shortness of breath with ADL's No   Develop more efficient breathing techniques such as purse lipped breathing and diaphragmatic breathing; and practicing self-pacing with activity Yes   Intervention While in program, learn and utilize the specific breathing techniques taught to you. Continue to practice and use the techniques as needed.  Instructed Ms Rehagen on PLB. When she was in Physical Therapy, she learned this technique .   Diabetes Yes   Goal Blood glucose control identified by blood glucose values, HgbA1C. Participant verbalizes understanding of the signs/symptoms of hyper/hypo glycemia, proper foot care and importance of medication and nutrition plan for blood glucose control.   Intervention Provide nutrition & aerobic exercise along with prescribed medications to achieve blood glucose in normal ranges: Fasting 65-99 mg/dL   Hypertension Yes   Goal Participant will see blood pressure controlled within the values  of 140/28mm/Hg or within value directed by their physician.   Intervention Provide nutrition & aerobic exercise along with prescribed medications to achieve BP 140/90 or less.      Personal Goals and Risk Factors Review:    Personal Goals Discharge (Final Personal Goals and Risk Factors Review):    ITP Comments:   Comments:

## 2015-05-23 NOTE — Patient Instructions (Signed)
Patient Instructions  Patient Details  Name: Kindy Misuraca MRN: [id number] Date of Birth: 11-18-1924 Referring Provider:  Marlane Hatcher, MD  Below are the personal goals you chose as well as exercise and nutrition goals. Our goal is to help you keep on track towards obtaining and maintaining your goals. We will be discussing your progress on these goals with you throughout the program.  Initial Exercise Prescription:     Initial Exercise Prescription - 05/23/15 1400    Date of Initial Exercise Prescription   Date 05/23/15   Treadmill   MPH 1.5   Grade 0   Minutes 10   Recumbant Bike   Level 2   RPM 40   Watts 20   Minutes 10   NuStep   Level 2   Watts 40   Minutes 10   Arm Ergometer   Level 1   Watts 10   Minutes 10   Recumbant Elliptical   Level 1   RPM 40   Watts 20   Minutes 10   REL-XR   Level 2   Watts 40   Minutes 10   T5 Nustep   Level 1   Watts 15   Minutes 10   Biostep-RELP   Level 2   Watts 40   Minutes 10   Prescription Details   Frequency (times per week) 3   Duration Progress to 30 minutes of continuous aerobic without signs/symptoms of physical distress   Intensity   THRR REST +  30   Ratings of Perceived Exertion 11-15   Perceived Dyspnea 2-4   Progression Continue progressive overload as per policy without signs/symptoms or physical distress.   Resistance Training   Training Prescription Yes   Weight 2   Reps 10-15      Exercise Goals: Frequency: Be able to perform aerobic exercise three times per week working toward 3-5 days per week.  Intensity: Work with a perceived exertion of 11 (fairly light) - 15 (hard) as tolerated. Follow your new exercise prescription and watch for changes in prescription as you progress with the program. Changes will be reviewed with you when they are made.  Duration: You should be able to do 30 minutes of continuous aerobic exercise in addition to a 5 minute warm-up and a 5 minute cool-down  routine.  Nutrition Goals: Your personal nutrition goals will be established when you do your nutrition analysis with the dietician.  The following are nutrition guidelines to follow: Cholesterol < 200mg /day Sodium < 1500mg /day Fiber: Women over 50 yrs - 21 grams per day  Personal Goals:     Personal Goals and Risk Factors at Admission - 05/23/15 1100    Personal Goals and Risk Factors on Admission   Increase Aerobic Exercise and Physical Activity Yes   Intervention While in program, learn and follow the exercise prescription taught. Start at a low level workload and increase workload after able to maintain previous level for 30 minutes. Increase time before increasing intensity.  Ms Lortz's goal is to increase her stamina for walking. She has a co-pay and therefore we will work her into FF with her [employer].   Understand more about Heart/Pulmonary Disease. Yes   Intervention While in program utilize professionals for any questions, and attend the education sessions. Great websites to use are www.americanheart.org or www.lung.org for reliable information.  Ms Dobson has CHF and would like to learn more about the disease.   Improve shortness of breath with ADL's No  Develop more efficient breathing techniques such as purse lipped breathing and diaphragmatic breathing; and practicing self-pacing with activity Yes   Intervention While in program, learn and utilize the specific breathing techniques taught to you. Continue to practice and use the techniques as needed.  Instructed Ms Koors on PLB. When she was in Physical Therapy, she learned this technique .   Diabetes Yes   Goal Blood glucose control identified by blood glucose values, HgbA1C. Participant verbalizes understanding of the signs/symptoms of hyper/hypo glycemia, proper foot care and importance of medication and nutrition plan for blood glucose control.   Intervention Provide nutrition & aerobic exercise along  with prescribed medications to achieve blood glucose in normal ranges: Fasting 65-99 mg/dL   Hypertension Yes   Goal Participant will see blood pressure controlled within the values of 140/44mm/Hg or within value directed by their physician.   Intervention Provide nutrition & aerobic exercise along with prescribed medications to achieve BP 140/90 or less.      Tobacco Use Initial Evaluation: History  Smoking status  . Never Smoker   Smokeless tobacco  . Never Used    Copy of goals given to participant.

## 2015-05-23 NOTE — Patient Instructions (Signed)
pul Patient Instructions  Patient Details  [name] MRN: [id number] Date of Birth: [birth year] Referring Provider:  Dr Wendie Chess  Below are the personal goals you chose as well as exercise and nutrition goals. Our goal is to help you keep on track towards obtaining and maintaining your goals. We will be discussing your progress on these goals with you throughout the program.  Initial Exercise Prescription:     Initial Exercise Prescription - 05/23/15 1400    Date of Initial Exercise Prescription   Date 05/23/15   Treadmill   MPH 1.5   Grade 0   Minutes 10   Recumbant Bike   Level 2   RPM 40   Watts 20   Minutes 10   NuStep   Level 2   Watts 40   Minutes 10   Arm Ergometer   Level 1   Watts 10   Minutes 10   Recumbant Elliptical   Level 1   RPM 40   Watts 20   Minutes 10   REL-XR   Level 2   Watts 40   Minutes 10   T5 Nustep   Level 1   Watts 15   Minutes 10   Biostep-RELP   Level 2   Watts 40   Minutes 10   Prescription Details   Frequency (times per week) 3   Duration Progress to 30 minutes of continuous aerobic without signs/symptoms of physical distress   Intensity   THRR REST +  30   Ratings of Perceived Exertion 11-15   Perceived Dyspnea 2-4   Progression Continue progressive overload as per policy without signs/symptoms or physical distress.   Resistance Training   Training Prescription Yes   Weight 2   Reps 10-15      Exercise Goals: Frequency: Be able to perform aerobic exercise three times per week working toward 3-5 days per week.  Intensity: Work with a perceived exertion of 11 (fairly light) - 15 (hard) as tolerated. Follow your new exercise prescription and watch for changes in prescription as you progress with the program. Changes will be reviewed with you when they are made.  Duration: You should be able to do 30 minutes of continuous aerobic exercise in addition to a 5 minute warm-up and a 5 minute cool-down  routine.  Nutrition Goals: Your personal nutrition goals will be established when you do your nutrition analysis with the dietician.  The following are nutrition guidelines to follow: Cholesterol < 200mg /day Sodium < 1500mg /day Fiber: Women over 50 yrs - 21 grams per day  Personal Goals:     Personal Goals and Risk Factors at Admission - 05/23/15 1100    Personal Goals and Risk Factors on Admission   Increase Aerobic Exercise and Physical Activity Yes   Intervention While in program, learn and follow the exercise prescription taught. Start at a low level workload and increase workload after able to maintain previous level for 30 minutes. Increase time before increasing intensity.  [name] goal is to increase her stamina for walking. She has a co-pay and therefore we will work her into FF with her [employer].   Understand more about Heart/Pulmonary Disease. Yes   Intervention While in program utilize professionals for any questions, and attend the education sessions. Great websites to use are www.americanheart.org or www.lung.org for reliable information.  [name] has CHF and would like to learn more about the disease.   Improve shortness of breath with ADL's No  Develop more efficient breathing techniques such as purse lipped breathing and diaphragmatic breathing; and practicing self-pacing with activity Yes   Intervention While in program, learn and utilize the specific breathing techniques taught to you. Continue to practice and use the techniques as needed.  Instructed [name] on PLB. When she was in Physical Therapy, she learned this technique .   Diabetes Yes   Goal Blood glucose control identified by blood glucose values, HgbA1C. Participant verbalizes understanding of the signs/symptoms of hyper/hypo glycemia, proper foot care and importance of medication and nutrition plan for blood glucose control.   Intervention Provide nutrition & aerobic exercise along  with prescribed medications to achieve blood glucose in normal ranges: Fasting 65-99 mg/dL   Hypertension Yes   Goal Participant will see blood pressure controlled within the values of 140/43mm/Hg or within value directed by their physician.   Intervention Provide nutrition & aerobic exercise along with prescribed medications to achieve BP 140/90 or less.      Tobacco Use Initial Evaluation: History  Smoking status   Never Smoker   Smokeless tobacco   Never Used    Copy of goals given to participant.

## 2015-05-24 ENCOUNTER — Encounter: Payer: Medicare PPO | Admitting: Physical Therapy

## 2015-05-29 ENCOUNTER — Encounter: Payer: Medicare PPO | Attending: Cardiology | Admitting: *Deleted

## 2015-05-29 DIAGNOSIS — I5023 Acute on chronic systolic (congestive) heart failure: Secondary | ICD-10-CM | POA: Insufficient documentation

## 2015-05-29 LAB — GLUCOSE, CAPILLARY: GLUCOSE-CAPILLARY: 189 mg/dL — AB (ref 65–99)

## 2015-05-29 NOTE — Progress Notes (Signed)
Daily Session Note  Patient Details  [name] MRN: [id number] Date of Birth: 01/24/1925 Referring Provider:  Drucker, Michael N, MD  Encounter Date: 05/29/2015  Check In:     Session Check In - 05/29/15 1144    Check-In   Staff Present Laureen Brown, BS, RRT, Respiratory Therapist; , BS, ACSM CEP, Exercise Physiologist;Steven Way, BS, ACSM EP-C, Exercise Physiologist   ER physicians immediately available to respond to emergencies [facility] immediately available ER MD   Physician(s) Kinner and Williams    Fall or balance concerns reported    No  No reported falls. Is at fall risk. No treadmill.    Warm-up and Cool-down Performed on first and last piece of equipment   VAD Patient? No   Pain Assessment   Currently in Pain? Yes   Pain Score 7    Pain Location Back   Pain Type Chronic pain   Pain Onset More than a month ago   Multiple Pain Sites No           Exercise Prescription Changes - 05/29/15 1000    Response to Exercise   Blood Pressure (Admit) 120/68 mmHg   Blood Pressure (Exercise) 124/64 mmHg   Blood Pressure (Exit) 122/64 mmHg   Heart Rate (Admit) 77 bpm   Heart Rate (Exercise) 97 bpm   Heart Rate (Exit) 73 bpm   Oxygen Saturation (Admit) 98 %   Oxygen Saturation (Exercise) 91 %   Oxygen Saturation (Exit) 91 %   Rating of Perceived Exertion (Exercise) 13   Perceived Dyspnea (Exercise) 0   Symptoms Omitted Arm Ergometer due to it being painful to back. This is due to chronic pain.    Comments Patient's first day of class. The initial exercise prescription was discussed with the patient. Exercise machine usage and safety were also discussed with the patient.    Duration Progress to 30 minutes of continuous aerobic without signs/symptoms of physical distress   Intensity Rest + 30   Progression Continue progressive overload as per policy without signs/symptoms or physical distress.   Resistance Training   Training Prescription Yes   Weight 2   Reps 10-15   NuStep   Level 2   Watts 40   Minutes 10   Biostep-RELP   Level 2   Watts 40   Minutes 10      Goals Met:  Proper associated with RPD/PD & O2 Sat Exercise tolerated well Personal goals reviewed Strength training completed today  Goals Unmet:  Not Applicable  Goals Comments: Today was the patient's first day of class. The patient's initial exercise prescription (based on the 6 min walk evaluation) was reviewed with the patient.     Dr. Mark Miller is Medical Director for [facility] and [facility]. 

## 2015-05-29 NOTE — Progress Notes (Signed)
Pulmonary Individual Treatment Plan  Patient Details  [name] MRN: [id number] Date of Birth: 04/10/1925 Referring Provider:  Marlane Hatcher, MD  Initial Encounter Date: 05/23/2015  Visit Diagnosis: Acute on chronic systolic congestive heart failure ([place])  Patient's Home Medications on Admission:  Current outpatient prescriptions:    apixaban (ELIQUIS) 5 MG TABS tablet, Take 5 mg by mouth 2 (two) times daily. , Disp: , Rfl:    aspirin 81 MG tablet, Take 81 mg by mouth daily., Disp: , Rfl:    baclofen (LIORESAL) 10 MG tablet, Take 10 mg by mouth 2 (two) times daily. , Disp: , Rfl:    carvedilol (COREG) 6.25 MG tablet, Take 6.25 mg by mouth 2 (two) times daily with a meal. , Disp: , Rfl:    docusate sodium (COLACE) 100 MG capsule, Take 100 mg by mouth daily., Disp: , Rfl:    escitalopram (LEXAPRO) 10 MG tablet, Take 10 mg by mouth daily., Disp: , Rfl:    ferrous sulfate 325 (65 FE) MG tablet, Take 325 mg by mouth daily with breakfast., Disp: , Rfl:    furosemide (LASIX) 80 MG tablet, Take 80 mg by mouth every other day. 80mg  every other day and 40mg  every other day, Disp: , Rfl:    glimepiride (AMARYL) 4 MG tablet, Take 4 mg by mouth 2 (two) times daily after a meal., Disp: , Rfl:    JANUVIA 100 MG tablet, Take 100 mg by mouth daily., Disp: , Rfl:    losartan (COZAAR) 25 MG tablet, Take 25 mg by mouth daily., Disp: , Rfl:    Magnesium Oxide -Mg Supplement 400 MG CAPS, Take 400 mg by mouth daily., Disp: , Rfl:    Melatonin 1 MG CAPS, Take 1 mg by mouth at bedtime. , Disp: , Rfl:    metolazone (ZAROXOLYN) 2.5 MG tablet, Take 2.5 mg by mouth 2 (two) times a week. Takes on Monday and Friday, Disp: , Rfl:    oxycodone (OXY-IR) 5 MG capsule, Take 5-10 mg by mouth See admin instructions. 5mg  in the morning and mid-afternoon and 10mg  at bedtime, Disp: , Rfl:    pantoprazole (PROTONIX) 40 MG tablet, Take 40 mg by mouth daily., Disp: , Rfl:    rOPINIRole  (REQUIP) 0.5 MG tablet, Take 0.5 mg by mouth 2 (two) times daily., Disp: , Rfl:    spironolactone (ALDACTONE) 25 MG tablet, Take 1 tablet by mouth daily., Disp: , Rfl:    vitamin B-12 (CYANOCOBALAMIN) 100 MCG tablet, Take 100 mcg by mouth daily., Disp: , Rfl:    vitamin C (ASCORBIC ACID) 500 MG tablet, Take 500 mg by mouth daily., Disp: , Rfl:    Vitamin D, Ergocalciferol, (DRISDOL) 50000 UNITS CAPS capsule, Take 50,000 Units by mouth every 7 (seven) days. , Disp: , Rfl:   Past Medical History: Past Medical History  Diagnosis Date   CHF (congestive heart failure) ([place])    Diabetes mellitus without complication ([place])    Hypertension    Chronic back pain     Tobacco Use: History  Smoking status   Never Smoker   Smokeless tobacco   Never Used    Labs: Recent Review Flowsheet Data    Labs for ITP Cardiac and Pulmonary Rehab Latest Ref Rng 03/04/2012 03/20/2015   Cholestrol 0-200 mg/dL 101 -   LDLCALC 0-100 mg/dL 60 -   HDL 40-60 mg/dL 23(L) -   Trlycerides 0-200 mg/dL 90 -   Hemoglobin A1c 4.0 - 6.0 % 7.7(H)  7.0(H)         POCT Glucose      05/29/15 1000           POCT Blood Glucose   Pre-Exercise 206 mg/dL  Muffin, grits, and coffee; Fasting Glucose 144       Post-Exercise 189 mg/dL          ADL UCSD:     ADL UCSD      05/23/15 1100       ADL UCSD   ADL Phase Entry     SOB Score total 14     Rest 0     Walk 0     Stairs 1     Bath 1     Dress 1     Shop 2         Pulmonary Function Assessment:     Pulmonary Function Assessment - 05/23/15 1100    Initial Spirometry Results   FVC% 47 %   FEV1% 20 %   FEV1/FVC Ratio 100   Breath   Bilateral Breath Sounds Clear;Decreased      Exercise Target Goals:    Exercise Program Goal: Individual exercise prescription set with THRR, safety & activity barriers. Participant demonstrates ability to understand and report RPE using BORG scale, to self-measure pulse accurately, and to acknowledge  the importance of the exercise prescription.  Exercise Prescription Goal: Starting with aerobic activity 30 plus minutes a day, 3 days per week for initial exercise prescription. Provide home exercise prescription and guidelines that participant acknowledges understanding prior to discharge.  Activity Barriers & Risk Stratification:     Activity Barriers & Risk Stratification - 05/23/15 1100    Activity Barriers & Risk Stratification   Activity Barriers Assistive Device;Deconditioning;Muscular Weakness   Risk Stratification Moderate      6 Minute Walk:     6 Minute Walk      05/23/15 1449       6 Minute Walk   Phase Initial     Distance 575 feet     Walk Time 6 minutes     Resting HR 74 bpm     Resting BP 118/72 mmHg     Max Ex. HR 77 bpm     Max Ex. BP 124/72 mmHg     RPE 11     Perceived Dyspnea  2     Symptoms No        Initial Exercise Prescription:     Initial Exercise Prescription - 05/23/15 1400    Date of Initial Exercise Prescription   Date 05/23/15   Treadmill   MPH 1.5   Grade 0   Minutes 10   Recumbant Bike   Level 2   RPM 40   Watts 20   Minutes 10   NuStep   Level 2   Watts 40   Minutes 10   Arm Ergometer   Level 1   Watts 10   Minutes 10   Recumbant Elliptical   Level 1   RPM 40   Watts 20   Minutes 10   REL-XR   Level 2   Watts 40   Minutes 10   T5 Nustep   Level 1   Watts 15   Minutes 10   Biostep-RELP   Level 2   Watts 40   Minutes 10   Prescription Details   Frequency (times per week) 3   Duration Progress to 30 minutes of continuous aerobic without signs/symptoms of physical distress  Intensity   THRR REST +  30   Ratings of Perceived Exertion 11-15   Perceived Dyspnea 2-4   Progression Continue progressive overload as per policy without signs/symptoms or physical distress.   Resistance Training   Training Prescription Yes   Weight 2   Reps 10-15      Exercise Prescription Changes:     Exercise  Prescription Changes      05/29/15 1000           Response to Exercise   Blood Pressure (Admit) 120/68 mmHg       Blood Pressure (Exercise) 124/64 mmHg       Blood Pressure (Exit) 122/64 mmHg       Heart Rate (Admit) 77 bpm       Heart Rate (Exercise) 97 bpm       Heart Rate (Exit) 73 bpm       Oxygen Saturation (Admit) 98 %       Oxygen Saturation (Exercise) 91 %       Oxygen Saturation (Exit) 91 %       Rating of Perceived Exertion (Exercise) 13       Perceived Dyspnea (Exercise) 0       Symptoms Omitted Arm Ergometer due to it being painful to back. This is due to chronic pain.        Comments Patient's first day of class. The initial exercise prescription was discussed with the patient. Exercise machine usage and safety were also discussed with the patient.        Duration Progress to 30 minutes of continuous aerobic without signs/symptoms of physical distress       Intensity Rest + 30       Progression Continue progressive overload as per policy without signs/symptoms or physical distress.       Resistance Training   Training Prescription Yes       Weight 2       Reps 10-15       NuStep   Level 2       Watts 40       Minutes 10       Biostep-RELP   Level 2       Watts 40       Minutes 10          Discharge Exercise Prescription (Final Exercise Prescription Changes):     Exercise Prescription Changes - 05/29/15 1000    Response to Exercise   Blood Pressure (Admit) 120/68 mmHg   Blood Pressure (Exercise) 124/64 mmHg   Blood Pressure (Exit) 122/64 mmHg   Heart Rate (Admit) 77 bpm   Heart Rate (Exercise) 97 bpm   Heart Rate (Exit) 73 bpm   Oxygen Saturation (Admit) 98 %   Oxygen Saturation (Exercise) 91 %   Oxygen Saturation (Exit) 91 %   Rating of Perceived Exertion (Exercise) 13   Perceived Dyspnea (Exercise) 0   Symptoms Omitted Arm Ergometer due to it being painful to back. This is due to chronic pain.    Comments Patient's first day of class. The initial  exercise prescription was discussed with the patient. Exercise machine usage and safety were also discussed with the patient.    Duration Progress to 30 minutes of continuous aerobic without signs/symptoms of physical distress   Intensity Rest + 30   Progression Continue progressive overload as per policy without signs/symptoms or physical distress.   Resistance Training   Training Prescription Yes   Weight 2  Reps 10-15   NuStep   Level 2   Watts 40   Minutes 10   Biostep-RELP   Level 2   Watts 40   Minutes 10       Nutrition:  Target Goals: Understanding of nutrition guidelines, daily intake of sodium 1500mg , cholesterol 200mg , calories 30% from fat and 7% or less from saturated fats, daily to have 5 or more servings of fruits and vegetables.  Biometrics:     Pre Biometrics - 05/23/15 1453    Pre Biometrics   Height 5\' 7"  (1.702 m)   Weight 165 lb 12.8 oz (75.206 kg)   Waist Circumference 37 inches   Hip Circumference 43 inches   Waist to Hip Ratio 0.86 %   BMI (Calculated) 26       Nutrition Therapy Plan and Nutrition Goals:     Nutrition Therapy & Goals - 05/23/15 1100    Nutrition Therapy   Diet Prefers not to meet with the dietitian      Nutrition Discharge: Rate Your Plate Scores:   Psychosocial: Target Goals: Acknowledge presence or absence of depression, maximize coping skills, provide positive support system. Participant is able to verbalize types and ability to use techniques and skills needed for reducing stress and depression.  Initial Review & Psychosocial Screening:     Initial Psych Review & Screening - 05/23/15 1100    Family Dynamics   Good Support System? Yes   Comments Ms Eckelkamp has good support from her husband and states she has no depression.   Barriers   Psychosocial barriers to participate in program There are no identifiable barriers or psychosocial needs.;The patient should benefit from training in stress management and  relaxation.   Screening Interventions   Interventions Encouraged to exercise      Quality of Life Scores:     Quality of Life - 05/23/15 1100    Quality of Life Scores   Health/Function Pre 12.2 %   Socioeconomic Pre 20.57 %   Psych/Spiritual Pre 16.29 %   Family Pre 14.4 %   GLOBAL Pre 15.09 %      PHQ-9:     Recent Review Flowsheet Data    Depression screen [facility] 2/9 05/23/2015 04/10/2015   Decreased Interest 1 3   Down, Depressed, Hopeless 2 0   PHQ - 2 Score 3 3   Altered sleeping 3 3   Tired, decreased energy 2 3   Change in appetite 0 0   Feeling bad or failure about yourself  2 3   Trouble concentrating 0 0   Moving slowly or fidgety/restless 1 0   Suicidal thoughts 1 0   PHQ-9 Score 12 12   Difficult doing work/chores Somewhat difficult Somewhat difficult      Psychosocial Evaluation and Intervention:   Psychosocial Re-Evaluation:  Education: Education Goals: Education classes will be provided on a weekly basis, covering required topics. Participant will state understanding/return demonstration of topics presented.  Learning Barriers/Preferences:     Learning Barriers/Preferences - 05/23/15 1100    Learning Barriers/Preferences   Learning Barriers None   Learning Preferences Group Instruction;Individual Instruction;Pictoral;Skilled Demonstration;Verbal Instruction;Video;Written Material      Education Topics: Initial Evaluation Education: - Verbal, written and demonstration of respiratory meds, RPE/PD scales, oximetry and breathing techniques. Instruction on use of nebulizers and MDIs: cleaning and proper use, rinsing mouth with steroid doses and importance of monitoring MDI activations.          Pulmonary Rehab from 05/29/2015 in [facility]  [place] [facility]   Date  05/23/15   Educator  LB   Instruction Review Code  2- meets goals/outcomes      General Nutrition Guidelines/Fats and Fiber: -Group instruction provided by  verbal, written material, models and posters to present the general guidelines for heart healthy nutrition. Gives an explanation and review of dietary fats and fiber.      Pulmonary Rehab from 05/29/2015 in [place]   Date  05/29/15   Educator  CR   Instruction Review Code  2- meets goals/outcomes      Controlling Sodium/Reading Food Labels: -Group verbal and written material supporting the discussion of sodium use in heart healthy nutrition. Review and explanation with models, verbal and written materials for utilization of the food label.   Exercise Physiology & Risk Factors: - Group verbal and written instruction with models to review the exercise physiology of the cardiovascular system and associated critical values. Details cardiovascular disease risk factors and the goals associated with each risk factor.   Aerobic Exercise & Resistance Training: - Gives group verbal and written discussion on the health impact of inactivity. On the components of aerobic and resistive training programs and the benefits of this training and how to safely progress through these programs.   Flexibility, Balance, General Exercise Guidelines: - Provides group verbal and written instruction on the benefits of flexibility and balance training programs. Provides general exercise guidelines with specific guidelines to those with heart or lung disease. Demonstration and skill practice provided.   Stress Management: - Provides group verbal and written instruction about the health risks of elevated stress, cause of high stress, and healthy ways to reduce stress.   Depression: - Provides group verbal and written instruction on the correlation between heart/lung disease and depressed mood, treatment options, and the stigmas associated with seeking treatment.   Exercise & Equipment Safety: - Individual verbal instruction and demonstration of equipment use and safety with  use of the equipment.      Pulmonary Rehab from 05/29/2015 in [place]   Date  05/29/15   Educator  LB   Instruction Review Code  2- meets goals/outcomes      Infection Prevention: - Provides verbal and written material to individual with discussion of infection control including proper hand washing and proper equipment cleaning during exercise session.      Pulmonary Rehab from 05/29/2015 in [place]   Date  05/29/15   Educator  LB   Instruction Review Code  2- meets goals/outcomes      Falls Prevention: - Provides verbal and written material to individual with discussion of falls prevention and safety.      Pulmonary Rehab from 05/29/2015 in [place]   Date  05/23/15   Educator  LB   Instruction Review Code  2- meets goals/outcomes      Diabetes: - Individual verbal and written instruction to review signs/symptoms of diabetes, desired ranges of glucose level fasting, after meals and with exercise. Advice that pre and post exercise glucose checks will be done for 3 sessions at entry of program.   Chronic Lung Diseases: - Group verbal and written instruction to review new updates, new respiratory medications, new advancements in procedures and treatments. Provide informative websites and "800" numbers of self-education.   Lung Procedures: - Group verbal and written instruction to describe testing methods done to diagnose lung disease. Review the  outcome of test results. Describe the treatment choices: Pulmonary Function Tests, ABGs and oximetry.   Energy Conservation: - Provide group verbal and written instruction for methods to conserve energy, plan and organize activities. Instruct on pacing techniques, use of adaptive equipment and posture/positioning to relieve shortness of breath.   Triggers: - Group verbal and written instruction to review types of  environmental controls: home humidity, furnaces, filters, dust mite/pet prevention, HEPA vacuums. To discuss weather changes, air quality and the benefits of nasal washing.   Exacerbations: - Group verbal and written instruction to provide: warning signs, infection symptoms, calling MD promptly, preventive modes, and value of vaccinations. Review: effective airway clearance, coughing and/or vibration techniques. Create an Sports administrator.   Oxygen: - Individual and group verbal and written instruction on oxygen therapy. Includes supplement oxygen, available portable oxygen systems, continuous and intermittent flow rates, oxygen safety, concentrators, and Medicare reimbursement for oxygen.   Respiratory Medications: - Group verbal and written instruction to review medications for lung disease. Drug class, frequency, complications, importance of spacers, rinsing mouth after steroid MDI's, and proper cleaning methods for nebulizers.   AED/CPR: - Group verbal and written instruction with the use of models to demonstrate the basic use of the AED with the basic ABC's of resuscitation.   Breathing Retraining: - Provides individuals verbal and written instruction on purpose, frequency, and proper technique of diaphragmatic breathing and pursed-lipped breathing. Applies individual practice skills.      Pulmonary Rehab from 05/29/2015 in [place]   Date  05/23/15   Educator  LB   Instruction Review Code  2- meets goals/outcomes      Anatomy and Physiology of the Lungs: - Group verbal and written instruction with the use of models to provide basic lung anatomy and physiology related to function, structure and complications of lung disease.   Heart Failure: - Group verbal and written instruction on the basics of heart failure: signs/symptoms, treatments, explanation of ejection fraction, enlarged heart and cardiomyopathy.   Sleep Apnea: - Individual verbal  and written instruction to review Obstructive Sleep Apnea. Review of risk factors, methods for diagnosing and types of masks and machines for OSA.   Anxiety: - Provides group, verbal and written instruction on the correlation between heart/lung disease and anxiety, treatment options, and management of anxiety.   Relaxation: - Provides group, verbal and written instruction about the benefits of relaxation for patients with heart/lung disease. Also provides patients with examples of relaxation techniques.   Knowledge Questionnaire Score:   Personal Goals and Risk Factors at Admission:     Personal Goals and Risk Factors at Admission - 05/23/15 1100    Personal Goals and Risk Factors on Admission   Increase Aerobic Exercise and Physical Activity Yes   Intervention While in program, learn and follow the exercise prescription taught. Start at a low level workload and increase workload after able to maintain previous level for 30 minutes. Increase time before increasing intensity.  Ms Pendergrass's goal is to increase her stamina for walking. She has a co-pay and therefore we will work her into FF with her [employer].   Understand more about Heart/Pulmonary Disease. Yes   Intervention While in program utilize professionals for any questions, and attend the education sessions. Great websites to use are www.americanheart.org or www.lung.org for reliable information.  Ms Dalsanto has CHF and would like to learn more about the disease.   Improve shortness of breath with ADL's No   Develop more  efficient breathing techniques such as purse lipped breathing and diaphragmatic breathing; and practicing self-pacing with activity Yes   Intervention While in program, learn and utilize the specific breathing techniques taught to you. Continue to practice and use the techniques as needed.  Instructed Ms Artim on PLB. When she was in Physical Therapy, she learned this technique .   Diabetes Yes    Goal Blood glucose control identified by blood glucose values, HgbA1C. Participant verbalizes understanding of the signs/symptoms of hyper/hypo glycemia, proper foot care and importance of medication and nutrition plan for blood glucose control.   Intervention Provide nutrition & aerobic exercise along with prescribed medications to achieve blood glucose in normal ranges: Fasting 65-99 mg/dL   Hypertension Yes   Goal Participant will see blood pressure controlled within the values of 140/74mm/Hg or within value directed by their physician.   Intervention Provide nutrition & aerobic exercise along with prescribed medications to achieve BP 140/90 or less.      Personal Goals and Risk Factors Review:      Goals and Risk Factor Review      05/29/15 1000           Breathing Techniques   Comments Ms Mcdivitt using PLB, more for pain than shortness of breath.          Personal Goals Discharge (Final Personal Goals and Risk Factors Review):      Goals and Risk Factor Review - 05/29/15 1000    Breathing Techniques   Comments Ms Bertolet using PLB, more for pain than shortness of breath.      ITP Comments:   Comments: Ms Abdo started her first day of exercise in [place]. Ms Candelas did wel with equipment orientation, although her back pain was rated at a "7" on the pain scale.

## 2015-05-31 ENCOUNTER — Encounter: Payer: Medicare PPO | Admitting: *Deleted

## 2015-05-31 DIAGNOSIS — I5023 Acute on chronic systolic (congestive) heart failure: Secondary | ICD-10-CM | POA: Diagnosis not present

## 2015-05-31 LAB — GLUCOSE, CAPILLARY
GLUCOSE-CAPILLARY: 201 mg/dL — AB (ref 65–99)
GLUCOSE-CAPILLARY: 170 mg/dL — AB (ref 65–99)

## 2015-05-31 NOTE — Progress Notes (Signed)
Daily Session Note  Patient Details  [name] MRN: [id number] Date of Birth: 10/05/1924 Referring Provider:  Tracie Harrier, MD  Encounter Date: 05/31/2015  Check In:     Session Check In - 05/31/15 1038    Check-In   Staff Present Lestine Box, BS, ACSM EP-C, Exercise Physiologist;Laureen Owens Shark, BS, RRT, Respiratory Therapist;Curtez Brallier Dillard Essex, MS, ACSM CEP, Exercise Physiologist   ER physicians immediately available to respond to emergencies [facility] immediately available ER MD   Physician(s) Clearnce Hasten and Joni Fears   Medication changes reported     No   Fall or balance concerns reported    Yes   Comments Both knees are swollen from the fall   Warm-up and Cool-down Performed on first and last piece of equipment   VAD Patient? No   Pain Assessment   Currently in Pain? No/denies   Multiple Pain Sites No           Exercise Prescription Changes - 05/31/15 1000    Exercise Review   Progression No   Response to Exercise   Symptoms Both knees were sore from fall   Comments Patient had sore knees from a fall so her exercise prescription was adjusted slightly. She was able to perform the same workloads, but had to go slower on while stepping on the NS and BS.    Duration Progress to 30 minutes of continuous aerobic without signs/symptoms of physical distress   Intensity Rest + 30   Progression Continue progressive overload as per policy without signs/symptoms or physical distress.   Resistance Training   Training Prescription Yes   Weight 1   Reps 10-15   NuStep   Level 2   Watts 40   Minutes 10   Biostep-RELP   Level 2   Watts 40   Minutes 10      Goals Met:  Proper associated with RPD/PD & O2 Sat Using PLB without cueing & demonstrates good technique Exercise tolerated well Personal goals reviewed Strength training completed today  Goals Unmet:  Some knee soreness from fall  Goals Comments: [name] was able to complete all exercise goals, but at  a slower speed due to soreness in knees from a fall. Personal and exercise goals expected to be met in 34 more sessions. Progress on specific individualized goals will be charted in patient's ITP. Upon completion of the program the patient will be comfortable managing exercise goals and progression on their own.    Dr. Emily Filbert is Medical Director for [facility] and [facility].

## 2015-06-06 ENCOUNTER — Telehealth: Payer: Self-pay

## 2015-06-06 NOTE — Telephone Encounter (Signed)
[name] called and stated that she is currently having some knee problems.  Physician has instructed her to remain off of it for a while, so she has decided to wait to return until after the holidays.

## 2015-06-16 ENCOUNTER — Encounter: Payer: Medicare PPO | Admitting: Respiratory Therapy

## 2015-06-16 DIAGNOSIS — I5023 Acute on chronic systolic (congestive) heart failure: Secondary | ICD-10-CM

## 2015-06-20 NOTE — Progress Notes (Unsigned)
Pulmonary Individual Treatment Plan  Patient Details  [name] MRN: [id number] Date of Birth: 07/10/1924 Referring Provider:  Tracie Harrier, MD; Dr Wendie Chess  Initial Encounter Date: 06/21/2015  Visit Diagnosis: Acute on chronic systolic congestive heart failure ([place])  Patient's Home Medications on Admission:  Current outpatient prescriptions:    apixaban (ELIQUIS) 5 MG TABS tablet, Take 5 mg by mouth 2 (two) times daily. , Disp: , Rfl:    aspirin 81 MG tablet, Take 81 mg by mouth daily., Disp: , Rfl:    baclofen (LIORESAL) 10 MG tablet, Take 10 mg by mouth 2 (two) times daily. , Disp: , Rfl:    carvedilol (COREG) 6.25 MG tablet, Take 6.25 mg by mouth 2 (two) times daily with a meal. , Disp: , Rfl:    docusate sodium (COLACE) 100 MG capsule, Take 100 mg by mouth daily., Disp: , Rfl:    escitalopram (LEXAPRO) 10 MG tablet, Take 10 mg by mouth daily., Disp: , Rfl:    ferrous sulfate 325 (65 FE) MG tablet, Take 325 mg by mouth daily with breakfast., Disp: , Rfl:    furosemide (LASIX) 80 MG tablet, Take 80 mg by mouth every other day. '80mg'$  every other day and '40mg'$  every other day, Disp: , Rfl:    glimepiride (AMARYL) 4 MG tablet, Take 4 mg by mouth 2 (two) times daily after a meal., Disp: , Rfl:    JANUVIA 100 MG tablet, Take 100 mg by mouth daily., Disp: , Rfl:    losartan (COZAAR) 25 MG tablet, Take 25 mg by mouth daily., Disp: , Rfl:    Magnesium Oxide -Mg Supplement 400 MG CAPS, Take 400 mg by mouth daily., Disp: , Rfl:    Melatonin 1 MG CAPS, Take 1 mg by mouth at bedtime. , Disp: , Rfl:    metolazone (ZAROXOLYN) 2.5 MG tablet, Take 2.5 mg by mouth 2 (two) times a week. Takes on Monday and Friday, Disp: , Rfl:    oxycodone (OXY-IR) 5 MG capsule, Take 5-10 mg by mouth See admin instructions. '5mg'$  in the morning and mid-afternoon and '10mg'$  at bedtime, Disp: , Rfl:    pantoprazole (PROTONIX) 40 MG tablet, Take 40 mg by mouth daily., Disp: , Rfl:     rOPINIRole (REQUIP) 0.5 MG tablet, Take 0.5 mg by mouth 2 (two) times daily., Disp: , Rfl:    spironolactone (ALDACTONE) 25 MG tablet, Take 1 tablet by mouth daily., Disp: , Rfl:    vitamin B-12 (CYANOCOBALAMIN) 100 MCG tablet, Take 100 mcg by mouth daily., Disp: , Rfl:    vitamin C (ASCORBIC ACID) 500 MG tablet, Take 500 mg by mouth daily., Disp: , Rfl:    Vitamin D, Ergocalciferol, (DRISDOL) 50000 UNITS CAPS capsule, Take 50,000 Units by mouth every 7 (seven) days. , Disp: , Rfl:   Past Medical History: Past Medical History  Diagnosis Date   CHF (congestive heart failure) ([place])    Diabetes mellitus without complication ([place])    Hypertension    Chronic back pain     Tobacco Use: History  Smoking status   Never Smoker   Smokeless tobacco   Never Used    Labs: Recent Review Flowsheet Data    Labs for ITP Cardiac and Pulmonary Rehab Latest Ref Rng 03/04/2012 03/20/2015   Cholestrol 0-200 mg/dL 101 -   LDLCALC 0-100 mg/dL 60 -   HDL 40-60 mg/dL 23(L) -   Trlycerides 0-200 mg/dL 90 -   Hemoglobin A1c 4.0 - 6.0 %  7.7(H) 7.0(H)         POCT Glucose      05/29/15 1000 05/31/15 1000         POCT Blood Glucose   Pre-Exercise 206 mg/dL  Muffin, grits, and coffee; Fasting Glucose 144 201 mg/dL      Post-Exercise 189 mg/dL 170 mg/dL         ADL UCSD:     ADL UCSD      05/23/15 1100       ADL UCSD   ADL Phase Entry     SOB Score total 14     Rest 0     Walk 0     Stairs 1     Bath 1     Dress 1     Shop 2         Pulmonary Function Assessment:     Pulmonary Function Assessment - 05/23/15 1100    Initial Spirometry Results   FVC% 47 %   FEV1% 20 %   FEV1/FVC Ratio 100   Breath   Bilateral Breath Sounds Clear;Decreased      Exercise Target Goals:    Exercise Program Goal: Individual exercise prescription set with THRR, safety & activity barriers. Participant demonstrates ability to understand and report RPE using BORG scale, to self-measure  pulse accurately, and to acknowledge the importance of the exercise prescription.  Exercise Prescription Goal: Starting with aerobic activity 30 plus minutes a day, 3 days per week for initial exercise prescription. Provide home exercise prescription and guidelines that participant acknowledges understanding prior to discharge.  Activity Barriers & Risk Stratification:     Activity Barriers & Risk Stratification - 05/23/15 1100    Activity Barriers & Risk Stratification   Activity Barriers Assistive Device;Deconditioning;Muscular Weakness   Risk Stratification Moderate      6 Minute Walk:     6 Minute Walk      05/23/15 1449       6 Minute Walk   Phase Initial     Distance 575 feet     Walk Time 6 minutes     Resting HR 74 bpm     Resting BP 118/72 mmHg     Max Ex. HR 77 bpm     Max Ex. BP 124/72 mmHg     RPE 11     Perceived Dyspnea  2     Symptoms No        Initial Exercise Prescription:     Initial Exercise Prescription - 05/23/15 1400    Date of Initial Exercise Prescription   Date 05/23/15   Treadmill   MPH 1.5   Grade 0   Minutes 10   Recumbant Bike   Level 2   RPM 40   Watts 20   Minutes 10   NuStep   Level 2   Watts 40   Minutes 10   Arm Ergometer   Level 1   Watts 10   Minutes 10   Recumbant Elliptical   Level 1   RPM 40   Watts 20   Minutes 10   REL-XR   Level 2   Watts 40   Minutes 10   T5 Nustep   Level 1   Watts 15   Minutes 10   Biostep-RELP   Level 2   Watts 40   Minutes 10   Prescription Details   Frequency (times per week) 3   Duration Progress to 30 minutes of continuous aerobic without signs/symptoms  of physical distress   Intensity   THRR REST +  30   Ratings of Perceived Exertion 11-15   Perceived Dyspnea 2-4   Progression Continue progressive overload as per policy without signs/symptoms or physical distress.   Resistance Training   Training Prescription Yes   Weight 2   Reps 10-15      Exercise  Prescription Changes:     Exercise Prescription Changes      05/29/15 1000 05/31/15 1000         Exercise Review   Progression  No      Response to Exercise   Blood Pressure (Admit) 120/68 mmHg       Blood Pressure (Exercise) 124/64 mmHg       Blood Pressure (Exit) 122/64 mmHg       Heart Rate (Admit) 77 bpm       Heart Rate (Exercise) 97 bpm       Heart Rate (Exit) 73 bpm       Oxygen Saturation (Admit) 98 %       Oxygen Saturation (Exercise) 91 %       Oxygen Saturation (Exit) 91 %       Rating of Perceived Exertion (Exercise) 13       Perceived Dyspnea (Exercise) 0       Symptoms Omitted Arm Ergometer due to it being painful to back. This is due to chronic pain.  Both knees were sore from fall      Comments Patient's first day of class. The initial exercise prescription was discussed with the patient. Exercise machine usage and safety were also discussed with the patient.  Patient had sore knees from a fall so her exercise prescription was adjusted slightly. She was able to perform the same workloads, but had to go slower on while stepping on the NS and BS.       Duration Progress to 30 minutes of continuous aerobic without signs/symptoms of physical distress Progress to 30 minutes of continuous aerobic without signs/symptoms of physical distress      Intensity Rest + 30 Rest + 30      Progression Continue progressive overload as per policy without signs/symptoms or physical distress. Continue progressive overload as per policy without signs/symptoms or physical distress.      Resistance Training   Training Prescription Yes Yes      Weight 1 1      Reps 10-15 10-15      NuStep   Level 2 2      Watts 40 40      Minutes 10 10      Biostep-RELP   Level 2 2      Watts 40 40      Minutes 10 10         Discharge Exercise Prescription (Final Exercise Prescription Changes):     Exercise Prescription Changes - 05/31/15 1000    Exercise Review   Progression No   Response to  Exercise   Symptoms Both knees were sore from fall   Comments Patient had sore knees from a fall so her exercise prescription was adjusted slightly. She was able to perform the same workloads, but had to go slower on while stepping on the NS and BS.    Duration Progress to 30 minutes of continuous aerobic without signs/symptoms of physical distress   Intensity Rest + 30   Progression Continue progressive overload as per policy without signs/symptoms or physical distress.   Resistance Training  Training Prescription Yes   Weight 1   Reps 10-15   NuStep   Level 2   Watts 40   Minutes 10   Biostep-RELP   Level 2   Watts 40   Minutes 10       Nutrition:  Target Goals: Understanding of nutrition guidelines, daily intake of sodium '1500mg'$ , cholesterol '200mg'$ , calories 30% from fat and 7% or less from saturated fats, daily to have 5 or more servings of fruits and vegetables.  Biometrics:     Pre Biometrics - 05/23/15 1453    Pre Biometrics   Height '5\' 7"'$  (1.702 m)   Weight 165 lb 12.8 oz (75.206 kg)   Waist Circumference 37 inches   Hip Circumference 43 inches   Waist to Hip Ratio 0.86 %   BMI (Calculated) 26       Nutrition Therapy Plan and Nutrition Goals:     Nutrition Therapy & Goals - 05/23/15 1100    Nutrition Therapy   Diet Prefers not to meet with the dietitian      Nutrition Discharge: Rate Your Plate Scores:   Psychosocial: Target Goals: Acknowledge presence or absence of depression, maximize coping skills, provide positive support system. Participant is able to verbalize types and ability to use techniques and skills needed for reducing stress and depression.  Initial Review & Psychosocial Screening:     Initial Psych Review & Screening - 05/23/15 1100    Family Dynamics   Good Support System? Yes   Comments [name] has good support from her husband and states she has no depression.   Barriers   Psychosocial barriers to participate in program There  are no identifiable barriers or psychosocial needs.;The patient should benefit from training in stress management and relaxation.   Screening Interventions   Interventions Encouraged to exercise      Quality of Life Scores:     Quality of Life - 05/23/15 1100    Quality of Life Scores   Health/Function Pre 12.2 %   Socioeconomic Pre 20.57 %   Psych/Spiritual Pre 16.29 %   Family Pre 14.4 %   GLOBAL Pre 15.09 %      PHQ-9:     Recent Review Flowsheet Data    Depression screen [facility] 2/9 05/23/2015 04/10/2015   Decreased Interest 1 3   Down, Depressed, Hopeless 2 0   PHQ - 2 Score 3 3   Altered sleeping 3 3   Tired, decreased energy 2 3   Change in appetite 0 0   Feeling bad or failure about yourself  2 3   Trouble concentrating 0 0   Moving slowly or fidgety/restless 1 0   Suicidal thoughts 1 0   PHQ-9 Score 12 12   Difficult doing work/chores Somewhat difficult Somewhat difficult      Psychosocial Evaluation and Intervention:     Psychosocial Evaluation - 05/31/15 1100    Psychosocial Evaluation & Interventions   Interventions Encouraged to exercise with the program and follow exercise prescription;Relaxation education;Stress management education   Comments Counselor met with [name] today for initial psychosocial evaluation.  She is an 81 year old who has congestive heart failure and was recommended to attend this program by her Dr.  Jamesetta So. [name] has a strong support system with a spouse of 54 years and several adult children who check in on her by phone daily.  She also has help that comes in 4 hours/day and is a part of a faith community.  [name]  struggles with chronic back pain subsequent to surgery in 2009.  She reports this pain prevents sound sleep for her, even though she is taking medication for pain at bedtime.  [name] denies a history of depression or anxiety or current symptoms, other than she admits to "worrying a lot."  she is on Lexapro 10 mg. for this  and states it helps some.  She reports her stressors are "worrying" about her spouse who has multiple health issues, including back problems, and her 81 year old son who was paralyzed 7 years ago and lives in [place].  [name] has goals to increase the strength in her legs and be able to "reitre" her walker and get back to her cane for support.  Counselor encouraged her to consistently exercise to accomplish this goal.  Also, she will benefit from the stress management and relaxation psychoeducational components of this program.     Continued Psychosocial Services Needed Yes  Sarah. Filsinger will benefit from consistent exercise, and psychoeducation on relaxation and stress management particularly.  Counselor will follow up with Sarah. [name] on these.       Psychosocial Re-Evaluation:  Education: Education Goals: Education classes will be provided on a weekly basis, covering required topics. Participant will state understanding/return demonstration of topics presented.  Learning Barriers/Preferences:     Learning Barriers/Preferences - 05/23/15 1100    Learning Barriers/Preferences   Learning Barriers None   Learning Preferences Group Instruction;Individual Instruction;Pictoral;Skilled Demonstration;Verbal Instruction;Video;Written Material      Education Topics: Initial Evaluation Education: - Verbal, written and demonstration of respiratory meds, RPE/PD scales, oximetry and breathing techniques. Instruction on use of nebulizers and MDIs: cleaning and proper use, rinsing mouth with steroid doses and importance of monitoring MDI activations.          Pulmonary Rehab from 05/31/2015 in [place]   Date  05/23/15   Educator  LB   Instruction Review Code  2- meets goals/outcomes      General Nutrition Guidelines/Fats and Fiber: -Group instruction provided by verbal, written material, models and posters to present the general guidelines for heart healthy  nutrition. Gives an explanation and review of dietary fats and fiber.      Pulmonary Rehab from 05/31/2015 in [place]   Date  05/29/15   Educator  CR   Instruction Review Code  2- meets goals/outcomes      Controlling Sodium/Reading Food Labels: -Group verbal and written material supporting the discussion of sodium use in heart healthy nutrition. Review and explanation with models, verbal and written materials for utilization of the food label.   Exercise Physiology & Risk Factors: - Group verbal and written instruction with models to review the exercise physiology of the cardiovascular system and associated critical values. Details cardiovascular disease risk factors and the goals associated with each risk factor.      Pulmonary Rehab from 05/31/2015 in [place]   Date  05/31/15   Educator  SW   Instruction Review Code  2- meets goals/outcomes      Aerobic Exercise & Resistance Training: - Gives group verbal and written discussion on the health impact of inactivity. On the components of aerobic and resistive training programs and the benefits of this training and how to safely progress through these programs.   Flexibility, Balance, General Exercise Guidelines: - Provides group verbal and written instruction on the benefits of flexibility and balance training programs.  Provides general exercise guidelines with specific guidelines to those with heart or lung disease. Demonstration and skill practice provided.   Stress Management: - Provides group verbal and written instruction about the health risks of elevated stress, cause of high stress, and healthy ways to reduce stress.   Depression: - Provides group verbal and written instruction on the correlation between heart/lung disease and depressed mood, treatment options, and the stigmas associated with seeking treatment.   Exercise & Equipment Safety: -  Individual verbal instruction and demonstration of equipment use and safety with use of the equipment.      Pulmonary Rehab from 05/31/2015 in [place]   Date  05/29/15   Educator  LB   Instruction Review Code  2- meets goals/outcomes      Infection Prevention: - Provides verbal and written material to individual with discussion of infection control including proper hand washing and proper equipment cleaning during exercise session.      Pulmonary Rehab from 05/31/2015 in [place]   Date  05/29/15   Educator  LB   Instruction Review Code  2- meets goals/outcomes      Falls Prevention: - Provides verbal and written material to individual with discussion of falls prevention and safety.      Pulmonary Rehab from 05/31/2015 in [place]   Date  05/23/15   Educator  LB   Instruction Review Code  2- meets goals/outcomes      Diabetes: - Individual verbal and written instruction to review signs/symptoms of diabetes, desired ranges of glucose level fasting, after meals and with exercise. Advice that pre and post exercise glucose checks will be done for 3 sessions at entry of program.   Chronic Lung Diseases: - Group verbal and written instruction to review new updates, new respiratory medications, new advancements in procedures and treatments. Provide informative websites and "800" numbers of self-education.   Lung Procedures: - Group verbal and written instruction to describe testing methods done to diagnose lung disease. Review the outcome of test results. Describe the treatment choices: Pulmonary Function Tests, ABGs and oximetry.   Energy Conservation: - Provide group verbal and written instruction for methods to conserve energy, plan and organize activities. Instruct on pacing techniques, use of adaptive equipment and posture/positioning to relieve shortness of  breath.   Triggers: - Group verbal and written instruction to review types of environmental controls: home humidity, furnaces, filters, dust mite/pet prevention, HEPA vacuums. To discuss weather changes, air quality and the benefits of nasal washing.   Exacerbations: - Group verbal and written instruction to provide: warning signs, infection symptoms, calling MD promptly, preventive modes, and value of vaccinations. Review: effective airway clearance, coughing and/or vibration techniques. Create an Sports administrator.   Oxygen: - Individual and group verbal and written instruction on oxygen therapy. Includes supplement oxygen, available portable oxygen systems, continuous and intermittent flow rates, oxygen safety, concentrators, and Medicare reimbursement for oxygen.   Respiratory Medications: - Group verbal and written instruction to review medications for lung disease. Drug class, frequency, complications, importance of spacers, rinsing mouth after steroid MDI's, and proper cleaning methods for nebulizers.   AED/CPR: - Group verbal and written instruction with the use of models to demonstrate the basic use of the AED with the basic ABC's of resuscitation.   Breathing Retraining: - Provides individuals verbal and written instruction on purpose, frequency, and proper technique of diaphragmatic breathing and pursed-lipped breathing. Applies individual practice skills.  Pulmonary Rehab from 05/31/2015 in [place]   Date  05/23/15   Educator  LB   Instruction Review Code  2- meets goals/outcomes      Anatomy and Physiology of the Lungs: - Group verbal and written instruction with the use of models to provide basic lung anatomy and physiology related to function, structure and complications of lung disease.   Heart Failure: - Group verbal and written instruction on the basics of heart failure: signs/symptoms, treatments, explanation of ejection  fraction, enlarged heart and cardiomyopathy.   Sleep Apnea: - Individual verbal and written instruction to review Obstructive Sleep Apnea. Review of risk factors, methods for diagnosing and types of masks and machines for OSA.   Anxiety: - Provides group, verbal and written instruction on the correlation between heart/lung disease and anxiety, treatment options, and management of anxiety.   Relaxation: - Provides group, verbal and written instruction about the benefits of relaxation for patients with heart/lung disease. Also provides patients with examples of relaxation techniques.   Knowledge Questionnaire Score:   Personal Goals and Risk Factors at Admission:     Personal Goals and Risk Factors at Admission - 05/23/15 1100    Personal Goals and Risk Factors on Admission   Increase Aerobic Exercise and Physical Activity Yes   Intervention While in program, learn and follow the exercise prescription taught. Start at a low level workload and increase workload after able to maintain previous level for 30 minutes. Increase time before increasing intensity.  [name]'s goal is to increase her stamina for walking. She has a co-pay and therefore we will work her into FF with her [employer].   Understand more about Heart/Pulmonary Disease. Yes   Intervention While in program utilize professionals for any questions, and attend the education sessions. Great websites to use are www.americanheart.org or www.lung.org for reliable information.  [name] has CHF and would like to learn more about the disease.   Improve shortness of breath with ADL's No   Develop more efficient breathing techniques such as purse lipped breathing and diaphragmatic breathing; and practicing self-pacing with activity Yes   Intervention While in program, learn and utilize the specific breathing techniques taught to you. Continue to practice and use the techniques as needed.  Instructed [name] on PLB.  When she was in Physical Therapy, she learned this technique .   Diabetes Yes   Goal Blood glucose control identified by blood glucose values, HgbA1C. Participant verbalizes understanding of the signs/symptoms of hyper/hypo glycemia, proper foot care and importance of medication and nutrition plan for blood glucose control.   Intervention Provide nutrition & aerobic exercise along with prescribed medications to achieve blood glucose in normal ranges: Fasting 65-99 mg/dL   Hypertension Yes   Goal Participant will see blood pressure controlled within the values of 140/22m/Hg or within value directed by their physician.   Intervention Provide nutrition & aerobic exercise along with prescribed medications to achieve BP 140/90 or less.      Personal Goals and Risk Factors Review:      Goals and Risk Factor Review      05/29/15 1000 05/31/15 1000         Increase Aerobic Exercise and Physical Activity   Goals Progress/Improvement seen   Yes      Comments  [facility] - she has a copay of $20.00 with each visit; she has done well on  the NS and BioStep with acceptable vital signs. He husband may join FF as well.      Breathing Techniques   Comments Sarah Goodell using PLB, more for pain than shortness of breath.          Personal Goals Discharge (Final Personal Goals and Risk Factors Review):      Goals and Risk Factor Review - 05/31/15 1000    Increase Aerobic Exercise and Physical Activity   Goals Progress/Improvement seen  Yes   Comments Plan to move [name] into [employer] in about a week - she has a copay of $20.00 with each visit; she has done well on the NS and BioStep with acceptable vital signs. He husband may join FF as well.      ITP Comments:     ITP Comments      06/20/15 1133           ITP Comments [name] last attended 05/31/2015. She has had some knee problems, but plans to return the first of the year.           Comments: [name] last attended 05/31/2015. She has had some knee problems, but plans to return the first of the year. 30 day note review.

## 2015-06-28 ENCOUNTER — Encounter: Payer: Medicare PPO | Attending: Cardiology

## 2015-06-28 DIAGNOSIS — I5023 Acute on chronic systolic (congestive) heart failure: Secondary | ICD-10-CM | POA: Insufficient documentation

## 2015-06-29 ENCOUNTER — Ambulatory Visit
Admission: RE | Admit: 2015-06-29 | Discharge: 2015-06-29 | Disposition: A | Discharge status: Home / Self Care | Payer: Medicare Other | Source: Ambulatory Visit | Attending: Orthopedic Surgery | Admitting: Orthopedic Surgery

## 2015-06-29 ENCOUNTER — Other Ambulatory Visit: Payer: Self-pay | Admitting: Orthopedic Surgery

## 2015-06-29 DIAGNOSIS — M79662 Pain in left lower leg: Secondary | ICD-10-CM

## 2015-06-29 DIAGNOSIS — M1712 Unilateral primary osteoarthritis, left knee: Secondary | ICD-10-CM | POA: Diagnosis not present

## 2015-06-29 DIAGNOSIS — M79661 Pain in right lower leg: Secondary | ICD-10-CM

## 2015-06-29 DIAGNOSIS — M7989 Other specified soft tissue disorders: Secondary | ICD-10-CM | POA: Diagnosis present

## 2015-06-30 ENCOUNTER — Telehealth: Payer: Self-pay | Admitting: Respiratory Therapy

## 2015-06-30 NOTE — Telephone Encounter (Signed)
Telephone call- No answer 

## 2015-07-17 ENCOUNTER — Telehealth: Payer: Self-pay

## 2015-07-17 ENCOUNTER — Encounter: Payer: Self-pay | Admitting: *Deleted

## 2015-07-17 DIAGNOSIS — I509 Heart failure, unspecified: Secondary | ICD-10-CM

## 2015-07-17 NOTE — Progress Notes (Signed)
Pulmonary Individual Treatment Plan  Patient Details  [name] MRN: [id number] Date of Birth: 06-28-1924 Referring Provider:  Dr. Wendie Chess  Initial Encounter Date:  06/21/15  Visit Diagnosis: Acute on chronic congestive heart failure, unspecified congestive heart failure type ([place])  Patient's Home Medications on Admission:  Current outpatient prescriptions:  .  apixaban (ELIQUIS) 5 MG TABS tablet, Take 5 mg by mouth 2 (two) times daily. , Disp: , Rfl:  .  aspirin 81 MG tablet, Take 81 mg by mouth daily., Disp: , Rfl:  .  baclofen (LIORESAL) 10 MG tablet, Take 10 mg by mouth 2 (two) times daily. , Disp: , Rfl:  .  carvedilol (COREG) 6.25 MG tablet, Take 6.25 mg by mouth 2 (two) times daily with a meal. , Disp: , Rfl:  .  docusate sodium (COLACE) 100 MG capsule, Take 100 mg by mouth daily., Disp: , Rfl:  .  escitalopram (LEXAPRO) 10 MG tablet, Take 10 mg by mouth daily., Disp: , Rfl:  .  ferrous sulfate 325 (65 FE) MG tablet, Take 325 mg by mouth daily with breakfast., Disp: , Rfl:  .  furosemide (LASIX) 80 MG tablet, Take 80 mg by mouth every other day. '80mg'$  every other day and '40mg'$  every other day, Disp: , Rfl:  .  glimepiride (AMARYL) 4 MG tablet, Take 4 mg by mouth 2 (two) times daily after a meal., Disp: , Rfl:  .  JANUVIA 100 MG tablet, Take 100 mg by mouth daily., Disp: , Rfl:  .  losartan (COZAAR) 25 MG tablet, Take 25 mg by mouth daily., Disp: , Rfl:  .  Magnesium Oxide -Mg Supplement 400 MG CAPS, Take 400 mg by mouth daily., Disp: , Rfl:  .  Melatonin 1 MG CAPS, Take 1 mg by mouth at bedtime. , Disp: , Rfl:  .  metolazone (ZAROXOLYN) 2.5 MG tablet, Take 2.5 mg by mouth 2 (two) times a week. Takes on Monday and Friday, Disp: , Rfl:  .  oxycodone (OXY-IR) 5 MG capsule, Take 5-10 mg by mouth See admin instructions. '5mg'$  in the morning and mid-afternoon and '10mg'$  at bedtime, Disp: , Rfl:  .  pantoprazole (PROTONIX) 40 MG tablet, Take 40 mg by mouth daily., Disp:  , Rfl:  .  rOPINIRole (REQUIP) 0.5 MG tablet, Take 0.5 mg by mouth 2 (two) times daily., Disp: , Rfl:  .  spironolactone (ALDACTONE) 25 MG tablet, Take 1 tablet by mouth daily., Disp: , Rfl:  .  vitamin B-12 (CYANOCOBALAMIN) 100 MCG tablet, Take 100 mcg by mouth daily., Disp: , Rfl:  .  vitamin C (ASCORBIC ACID) 500 MG tablet, Take 500 mg by mouth daily., Disp: , Rfl:  .  Vitamin D, Ergocalciferol, (DRISDOL) 50000 UNITS CAPS capsule, Take 50,000 Units by mouth every 7 (seven) days. , Disp: , Rfl:   Past Medical History: Past Medical History  Diagnosis Date  . CHF (congestive heart failure) ([place])   . Diabetes mellitus without complication ([place])   . Hypertension   . Chronic back pain     Tobacco Use: History  Smoking status  . Never Smoker   Smokeless tobacco  . Never Used    Labs: Recent Review Flowsheet Data    Labs for ITP Cardiac and Pulmonary Rehab Latest Ref Rng 03/04/2012 03/20/2015   Cholestrol 0-200 mg/dL 101 -   LDLCALC 0-100 mg/dL 60 -   HDL 40-60 mg/dL 23(L) -   Trlycerides 0-200 mg/dL 90 -   Hemoglobin A1c 4.0 -  6.0 % 7.7(H) 7.0(H)         POCT Glucose      05/29/15 1000 05/31/15 1000         POCT Blood Glucose   Pre-Exercise 206 mg/dL  Muffin, grits, and coffee; Fasting Glucose 144 201 mg/dL      Post-Exercise 189 mg/dL 170 mg/dL         ADL UCSD:     ADL UCSD      05/23/15 1100       ADL UCSD   ADL Phase Entry     SOB Score total 14     Rest 0     Walk 0     Stairs 1     Bath 1     Dress 1     Shop 2         Pulmonary Function Assessment:     Pulmonary Function Assessment - 05/23/15 1100    Initial Spirometry Results   FVC% 47 %   FEV1% 20 %   FEV1/FVC Ratio 100   Breath   Bilateral Breath Sounds Clear;Decreased      Exercise Target Goals:    Exercise Program Goal: Individual exercise prescription set with THRR, safety & activity barriers. Participant demonstrates ability to understand and report RPE using BORG scale, to  self-measure pulse accurately, and to acknowledge the importance of the exercise prescription.  Exercise Prescription Goal: Starting with aerobic activity 30 plus minutes a day, 3 days per week for initial exercise prescription. Provide home exercise prescription and guidelines that participant acknowledges understanding prior to discharge.  Activity Barriers & Risk Stratification:     Activity Barriers & Risk Stratification - 05/23/15 1100    Activity Barriers & Risk Stratification   Activity Barriers Assistive Device;Deconditioning;Muscular Weakness   Risk Stratification Moderate      6 Minute Walk:     6 Minute Walk      05/23/15 1449       6 Minute Walk   Phase Initial     Distance 575 feet     Walk Time 6 minutes     Resting HR 74 bpm     Resting BP 118/72 mmHg     Max Ex. HR 77 bpm     Max Ex. BP 124/72 mmHg     RPE 11     Perceived Dyspnea  2     Symptoms No        Initial Exercise Prescription:     Initial Exercise Prescription - 05/23/15 1400    Date of Initial Exercise Prescription   Date 05/23/15   Treadmill   MPH 1.5   Grade 0   Minutes 10   Recumbant Bike   Level 2   RPM 40   Watts 20   Minutes 10   NuStep   Level 2   Watts 40   Minutes 10   Arm Ergometer   Level 1   Watts 10   Minutes 10   Recumbant Elliptical   Level 1   RPM 40   Watts 20   Minutes 10   REL-XR   Level 2   Watts 40   Minutes 10   T5 Nustep   Level 1   Watts 15   Minutes 10   Biostep-RELP   Level 2   Watts 40   Minutes 10   Prescription Details   Frequency (times per week) 3   Duration Progress to 30 minutes of continuous aerobic  without signs/symptoms of physical distress   Intensity   THRR REST +  30   Ratings of Perceived Exertion 11-15   Perceived Dyspnea 2-4   Progression Continue progressive overload as per policy without signs/symptoms or physical distress.   Resistance Training   Training Prescription Yes   Weight 2   Reps 10-15       Exercise Prescription Changes:     Exercise Prescription Changes      05/29/15 1000 05/31/15 1000         Exercise Review   Progression  No      Response to Exercise   Blood Pressure (Admit) 120/68 mmHg       Blood Pressure (Exercise) 124/64 mmHg       Blood Pressure (Exit) 122/64 mmHg       Heart Rate (Admit) 77 bpm       Heart Rate (Exercise) 97 bpm       Heart Rate (Exit) 73 bpm       Oxygen Saturation (Admit) 98 %       Oxygen Saturation (Exercise) 91 %       Oxygen Saturation (Exit) 91 %       Rating of Perceived Exertion (Exercise) 13       Perceived Dyspnea (Exercise) 0       Symptoms Omitted Arm Ergometer due to it being painful to back. This is due to chronic pain.  Both knees were sore from fall      Comments Patient's first day of class. The initial exercise prescription was discussed with the patient. Exercise machine usage and safety were also discussed with the patient.  Patient had sore knees from a fall so her exercise prescription was adjusted slightly. She was able to perform the same workloads, but had to go slower on while stepping on the NS and BS.       Duration Progress to 30 minutes of continuous aerobic without signs/symptoms of physical distress Progress to 30 minutes of continuous aerobic without signs/symptoms of physical distress      Intensity Rest + 30 Rest + 30      Progression Continue progressive overload as per policy without signs/symptoms or physical distress. Continue progressive overload as per policy without signs/symptoms or physical distress.      Resistance Training   Training Prescription Yes Yes      Weight 1 1      Reps 10-15 10-15      NuStep   Level 2 2      Watts 40 40      Minutes 10 10      Biostep-RELP   Level 2 2      Watts 40 40      Minutes 10 10         Discharge Exercise Prescription (Final Exercise Prescription Changes):     Exercise Prescription Changes - 05/31/15 1000    Exercise Review   Progression No    Response to Exercise   Symptoms Both knees were sore from fall   Comments Patient had sore knees from a fall so her exercise prescription was adjusted slightly. She was able to perform the same workloads, but had to go slower on while stepping on the NS and BS.    Duration Progress to 30 minutes of continuous aerobic without signs/symptoms of physical distress   Intensity Rest + 30   Progression Continue progressive overload as per policy without signs/symptoms or physical distress.   Resistance  Training   Training Prescription Yes   Weight 1   Reps 10-15   NuStep   Level 2   Watts 40   Minutes 10   Biostep-RELP   Level 2   Watts 40   Minutes 10       Nutrition:  Target Goals: Understanding of nutrition guidelines, daily intake of sodium '1500mg'$ , cholesterol '200mg'$ , calories 30% from fat and 7% or less from saturated fats, daily to have 5 or more servings of fruits and vegetables.  Biometrics:     Pre Biometrics - 05/23/15 1453    Pre Biometrics   Height '5\' 7"'$  (1.702 m)   Weight 165 lb 12.8 oz (75.206 kg)   Waist Circumference 37 inches   Hip Circumference 43 inches   Waist to Hip Ratio 0.86 %   BMI (Calculated) 26       Nutrition Therapy Plan and Nutrition Goals:     Nutrition Therapy & Goals - 05/23/15 1100    Nutrition Therapy   Diet Prefers not to meet with the dietitian      Nutrition Discharge: Rate Your Plate Scores:   Psychosocial: Target Goals: Acknowledge presence or absence of depression, maximize coping skills, provide positive support system. Participant is able to verbalize types and ability to use techniques and skills needed for reducing stress and depression.  Initial Review & Psychosocial Screening:     Initial Psych Review & Screening - 05/23/15 1100    Family Dynamics   Good Support System? Yes   Comments Ms Foskey has good support from her husband and states she has no depression.   Barriers   Psychosocial barriers to participate  in program There are no identifiable barriers or psychosocial needs.;The patient should benefit from training in stress management and relaxation.   Screening Interventions   Interventions Encouraged to exercise      Quality of Life Scores:     Quality of Life - 05/23/15 1100    Quality of Life Scores   Health/Function Pre 12.2 %   Socioeconomic Pre 20.57 %   Psych/Spiritual Pre 16.29 %   Family Pre 14.4 %   GLOBAL Pre 15.09 %      PHQ-9:     Recent Review Flowsheet Data    Depression screen [facility] 2/9 05/23/2015 04/10/2015   Decreased Interest 1 3   Down, Depressed, Hopeless 2 0   PHQ - 2 Score 3 3   Altered sleeping 3 3   Tired, decreased energy 2 3   Change in appetite 0 0   Feeling bad or failure about yourself  2 3   Trouble concentrating 0 0   Moving slowly or fidgety/restless 1 0   Suicidal thoughts 1 0   PHQ-9 Score 12 12   Difficult doing work/chores Somewhat difficult Somewhat difficult      Psychosocial Evaluation and Intervention:     Psychosocial Evaluation - 05/31/15 1100    Psychosocial Evaluation & Interventions   Interventions Encouraged to exercise with the program and follow exercise prescription;Relaxation education;Stress management education   Comments Counselor met with Ms. Bizzarro today for initial psychosocial evaluation.  She is an 82 year old who has congestive heart failure and was recommended to attend this program by her Dr.  Jamesetta So. Carlis Abbott has a strong support system with a spouse of 55 years and several adult children who check in on her by phone daily.  She also has help that comes in 4 hours/day and is a part of a  faith community.  Ms. Girvan  struggles with chronic back pain subsequent to surgery in 2009.  She reports this pain prevents sound sleep for her, even though she is taking medication for pain at bedtime.  Ms. Steelman denies a history of depression or anxiety or current symptoms, other than she admits to "worrying a lot."  she is on Lexapro  10 mg. for this and states it helps some.  She reports her stressors are "worrying" about her spouse who has multiple health issues, including back problems, and her 23 year old son who was paralyzed 7 years ago and lives in [place].  Ms. Len has goals to increase the strength in her legs and be able to "reitre" her walker and get back to her cane for support.  Counselor encouraged her to consistently exercise to accomplish this goal.  Also, she will benefit from the stress management and relaxation psychoeducational components of this program.     Continued Psychosocial Services Needed Yes  Ms. Treese will benefit from consistent exercise, and psychoeducation on relaxation and stress management particularly.  Counselor will follow up with Ms. Carlis Abbott on these.       Psychosocial Re-Evaluation:  Education: Education Goals: Education classes will be provided on a weekly basis, covering required topics. Participant will state understanding/return demonstration of topics presented.  Learning Barriers/Preferences:     Learning Barriers/Preferences - 05/23/15 1100    Learning Barriers/Preferences   Learning Barriers None   Learning Preferences Group Instruction;Individual Instruction;Pictoral;Skilled Demonstration;Verbal Instruction;Video;Written Material      Education Topics: Initial Evaluation Education: - Verbal, written and demonstration of respiratory meds, RPE/PD scales, oximetry and breathing techniques. Instruction on use of nebulizers and MDIs: cleaning and proper use, rinsing mouth with steroid doses and importance of monitoring MDI activations.          Pulmonary Rehab from 05/31/2015 in [place]   Date  05/23/15   Educator  LB   Instruction Review Code  2- meets goals/outcomes      General Nutrition Guidelines/Fats and Fiber: -Group instruction provided by verbal, written material, models and posters to present the general guidelines for  heart healthy nutrition. Gives an explanation and review of dietary fats and fiber.      Pulmonary Rehab from 05/31/2015 in [place]   Date  05/29/15   Educator  CR   Instruction Review Code  2- meets goals/outcomes      Controlling Sodium/Reading Food Labels: -Group verbal and written material supporting the discussion of sodium use in heart healthy nutrition. Review and explanation with models, verbal and written materials for utilization of the food label.   Exercise Physiology & Risk Factors: - Group verbal and written instruction with models to review the exercise physiology of the cardiovascular system and associated critical values. Details cardiovascular disease risk factors and the goals associated with each risk factor.      Pulmonary Rehab from 05/31/2015 in [place]   Date  05/31/15   Educator  SW   Instruction Review Code  2- meets goals/outcomes      Aerobic Exercise & Resistance Training: - Gives group verbal and written discussion on the health impact of inactivity. On the components of aerobic and resistive training programs and the benefits of this training and how to safely progress through these programs.   Flexibility, Balance, General Exercise Guidelines: - Provides group verbal and written instruction on the benefits of flexibility and  balance training programs. Provides general exercise guidelines with specific guidelines to those with heart or lung disease. Demonstration and skill practice provided.   Stress Management: - Provides group verbal and written instruction about the health risks of elevated stress, cause of high stress, and healthy ways to reduce stress.   Depression: - Provides group verbal and written instruction on the correlation between heart/lung disease and depressed mood, treatment options, and the stigmas associated with seeking treatment.   Exercise &  Equipment Safety: - Individual verbal instruction and demonstration of equipment use and safety with use of the equipment.      Pulmonary Rehab from 05/31/2015 in [facility]   Date  05/29/15   Educator  LB   Instruction Review Code  2- meets goals/outcomes      Infection Prevention: - Provides verbal and written material to individual with discussion of infection control including proper hand washing and proper equipment cleaning during exercise session.      Pulmonary Rehab from 05/31/2015 in [facility]   Date  05/29/15   Educator  LB   Instruction Review Code  2- meets goals/outcomes      Falls Prevention: - Provides verbal and written material to individual with discussion of falls prevention and safety.      Pulmonary Rehab from 05/31/2015 in [facility]   Date  05/23/15   Educator  LB   Instruction Review Code  2- meets goals/outcomes      Diabetes: - Individual verbal and written instruction to review signs/symptoms of diabetes, desired ranges of glucose level fasting, after meals and with exercise. Advice that pre and post exercise glucose checks will be done for 3 sessions at entry of program.   Chronic Lung Diseases: - Group verbal and written instruction to review new updates, new respiratory medications, new advancements in procedures and treatments. Provide informative websites and "800" numbers of self-education.   Lung Procedures: - Group verbal and written instruction to describe testing methods done to diagnose lung disease. Review the outcome of test results. Describe the treatment choices: Pulmonary Function Tests, ABGs and oximetry.   Energy Conservation: - Provide group verbal and written instruction for methods to conserve energy, plan and organize activities. Instruct on pacing techniques, use of adaptive equipment and posture/positioning to  relieve shortness of breath.   Triggers: - Group verbal and written instruction to review types of environmental controls: home humidity, furnaces, filters, dust mite/pet prevention, HEPA vacuums. To discuss weather changes, air quality and the benefits of nasal washing.   Exacerbations: - Group verbal and written instruction to provide: warning signs, infection symptoms, calling MD promptly, preventive modes, and value of vaccinations. Review: effective airway clearance, coughing and/or vibration techniques. Create an Sport and exercise psychologist.   Oxygen: - Individual and group verbal and written instruction on oxygen therapy. Includes supplement oxygen, available portable oxygen systems, continuous and intermittent flow rates, oxygen safety, concentrators, and Medicare reimbursement for oxygen.   Respiratory Medications: - Group verbal and written instruction to review medications for lung disease. Drug class, frequency, complications, importance of spacers, rinsing mouth after steroid MDI's, and proper cleaning methods for nebulizers.   AED/CPR: - Group verbal and written instruction with the use of models to demonstrate the basic use of the AED with the basic ABC's of resuscitation.   Breathing Retraining: - Provides individuals verbal and written instruction on purpose, frequency, and proper technique of diaphragmatic breathing and pursed-lipped breathing. Applies  individual practice skills.      Pulmonary Rehab from 05/31/2015 in [facility]   Date  05/23/15   Educator  LB   Instruction Review Code  2- meets goals/outcomes      Anatomy and Physiology of the Lungs: - Group verbal and written instruction with the use of models to provide basic lung anatomy and physiology related to function, structure and complications of lung disease.   Heart Failure: - Group verbal and written instruction on the basics of heart failure: signs/symptoms, treatments,  explanation of ejection fraction, enlarged heart and cardiomyopathy.   Sleep Apnea: - Individual verbal and written instruction to review Obstructive Sleep Apnea. Review of risk factors, methods for diagnosing and types of masks and machines for OSA.   Anxiety: - Provides group, verbal and written instruction on the correlation between heart/lung disease and anxiety, treatment options, and management of anxiety.   Relaxation: - Provides group, verbal and written instruction about the benefits of relaxation for patients with heart/lung disease. Also provides patients with examples of relaxation techniques.   Knowledge Questionnaire Score:   Personal Goals and Risk Factors at Admission:     Personal Goals and Risk Factors at Admission - 05/23/15 1100    Personal Goals and Risk Factors on Admission   Increase Aerobic Exercise and Physical Activity Yes   Intervention While in program, learn and follow the exercise prescription taught. Start at a low level workload and increase workload after able to maintain previous level for 30 minutes. Increase time before increasing intensity.  Ms Crite's goal is to increase her stamina for walking. She has a co-pay and therefore we will work her into FF with her [employer].   Understand more about Heart/Pulmonary Disease. Yes   Intervention While in program utilize professionals for any questions, and attend the education sessions. Great websites to use are www.americanheart.org or www.lung.org for reliable information.  Ms Bossard has CHF and would like to learn more about the disease.   Improve shortness of breath with ADL's No   Develop more efficient breathing techniques such as purse lipped breathing and diaphragmatic breathing; and practicing self-pacing with activity Yes   Intervention While in program, learn and utilize the specific breathing techniques taught to you. Continue to practice and use the techniques as needed.   Instructed Ms Renken on PLB. When she was in Physical Therapy, she learned this technique .   Diabetes Yes   Goal Blood glucose control identified by blood glucose values, HgbA1C. Participant verbalizes understanding of the signs/symptoms of hyper/hypo glycemia, proper foot care and importance of medication and nutrition plan for blood glucose control.   Intervention Provide nutrition & aerobic exercise along with prescribed medications to achieve blood glucose in normal ranges: Fasting 65-99 mg/dL   Hypertension Yes   Goal Participant will see blood pressure controlled within the values of 140/34mm/Hg or within value directed by their physician.   Intervention Provide nutrition & aerobic exercise along with prescribed medications to achieve BP 140/90 or less.      Personal Goals and Risk Factors Review:      Goals and Risk Factor Review      05/29/15 1000 05/31/15 1000         Increase Aerobic Exercise and Physical Activity   Goals Progress/Improvement seen   Yes      Comments  Plan to move Ms Chestine Spore into [employer] in about a week - she has a copay of $20.00  with each visit; she has done well on the NS and BioStep with acceptable vital signs. He husband may join FF as well.      Breathing Techniques   Comments Ms Barnette using PLB, more for pain than shortness of breath.          Personal Goals Discharge (Final Personal Goals and Risk Factors Review):      Goals and Risk Factor Review - 05/31/15 1000    Increase Aerobic Exercise and Physical Activity   Goals Progress/Improvement seen  Yes   Comments Plan to move Ms Carlis Abbott into [employer] in about a week - she has a copay of $20.00 with each visit; she has done well on the NS and BioStep with acceptable vital signs. He husband may join FF as well.      ITP Comments:     ITP Comments      06/20/15 1133           ITP Comments Ms Hayman last attended 05/31/2015. She has had some knee problems, but plans to return the first of  the year.          Comments: 30 Day Review

## 2015-07-17 NOTE — Telephone Encounter (Signed)
[name] is feeling better, and is going to see her orthopedic doctor this Friday to get clearance to return to exercise.  Will update [place] as able.

## 2015-07-25 ENCOUNTER — Telehealth: Payer: Self-pay | Admitting: Respiratory Therapy

## 2015-07-25 NOTE — Telephone Encounter (Signed)
Called [name], last attended 05/31/2015, she has clearance from her physician to return to [place].

## 2015-07-26 ENCOUNTER — Encounter: Payer: Medicare Other | Attending: Cardiology | Admitting: *Deleted

## 2015-07-26 DIAGNOSIS — I5023 Acute on chronic systolic (congestive) heart failure: Secondary | ICD-10-CM | POA: Diagnosis not present

## 2015-07-26 DIAGNOSIS — I509 Heart failure, unspecified: Secondary | ICD-10-CM

## 2015-07-26 LAB — GLUCOSE, CAPILLARY: Glucose-Capillary: 158 mg/dL — ABNORMAL HIGH (ref 65–99)

## 2015-07-26 NOTE — Progress Notes (Signed)
Daily Session Note  Patient Details  [name] MRN: [id number] Date of Birth: Feb 17, 1925 Referring Provider:  Marlane Hatcher, MD  Encounter Date: 07/26/2015  Check In:     Session Check In - 07/26/15 1045    Check-In   Staff Present Candiss Norse, MS, ACSM CEP, Exercise Physiologist;Steven Way, BS, ACSM EP-C, Exercise Physiologist;Laureen Owens Shark, BS, RRT, Respiratory Therapist   ER physicians immediately available to respond to emergencies [facility] immediately available ER MD   Physician(s) Kerman Passey and Jimmye Norman   Medication changes reported     No   Fall or balance concerns reported    No   Warm-up and Cool-down Performed on first and last piece of equipment   VAD Patient? No   Pain Assessment   Currently in Pain? Yes  Pain from fall in December. Knees are sore   Pain Location Knee   Multiple Pain Sites Yes           Exercise Prescription Changes - 07/26/15 1000    Exercise Review   Progression No   Response to Exercise   Symptoms Some back and knee pain residual from her fall in December   Comments Patient was able to resume previous workloads after being out for over a month which was encouraging to her.    Duration Progress to 30 minutes of continuous aerobic without signs/symptoms of physical distress   Intensity Rest + 30   Progression Continue progressive overload as per policy without signs/symptoms or physical distress.   Resistance Training   Training Prescription Yes   Weight 1   Reps 10-15   NuStep   Level 2   Watts 20   Minutes 10   Biostep-RELP   Level 2   Watts 18   Minutes 10      Goals Met:  Proper associated with RPD/PD & O2 Sat Independence with exercise equipment Using PLB without cueing & demonstrates good technique Exercise tolerated well Personal goals reviewed  Goals Unmet:  Not Applicable  Goals Comments: Patient completed exercise prescription and all exercise goals during rehab session. The exercise was  tolerated well and the patient is progressing in the program. Patient did not stay for weights.    Dr. Emily Filbert is Medical Director for [place] and [facility].

## 2015-07-31 ENCOUNTER — Encounter: Payer: Medicare Other | Admitting: *Deleted

## 2015-07-31 DIAGNOSIS — I5023 Acute on chronic systolic (congestive) heart failure: Secondary | ICD-10-CM | POA: Diagnosis not present

## 2015-07-31 DIAGNOSIS — I509 Heart failure, unspecified: Secondary | ICD-10-CM

## 2015-07-31 LAB — GLUCOSE, CAPILLARY: Glucose-Capillary: 174 mg/dL — ABNORMAL HIGH (ref 65–99)

## 2015-07-31 NOTE — Progress Notes (Signed)
Daily Session Note  Patient Details  [name] MRN: [id number] Date of Birth: [birth year] Referring Provider:  Marlane Hatcher, MD  Encounter Date: 07/31/2015  Check In:     Session Check In - 07/31/15 1021    Check-In   Staff Present Candiss Norse, MS, ACSM CEP, Exercise Physiologist;Laureen Owens Shark, BS, RRT, Respiratory Bertis Ruddy, BS, ACSM CEP, Exercise Physiologist   Supervising physician immediately available to respond to emergencies [facility] immediately available ER MD   Physician(s) Clearnce Hasten and Paduchowski   Medication changes reported     No   Fall or balance concerns reported    No   Warm-up and Cool-down Performed on first and last piece of equipment   Resistance Training Performed No   VAD Patient? No   Pain Assessment   Currently in Pain? No/denies   Multiple Pain Sites No           Exercise Prescription Changes - 07/31/15 1000    Exercise Review   Progression Yes   Response to Exercise   Symptoms Complained of some tiredness today. Completed the exercise with intervals and took rests when she needed them    Comments Reviewed individualized exercise prescription and made increases per departmental policy. Exercise increases were discussed with the patient and they were able to perform the new work loads without issue (no signs or symptoms).    Duration Progress to 30 minutes of continuous aerobic without signs/symptoms of physical distress   Intensity Rest + 30   Progression Continue progressive overload as per policy without signs/symptoms or physical distress.   Resistance Training   Training Prescription Yes   Weight 1   Reps 10-15   NuStep   Level 2   Watts 20   Minutes 12   Biostep-RELP   Level 2   Watts 18   Minutes 10      Goals Met:  Proper associated with RPD/PD & O2 Sat Independence with exercise equipment Using PLB without cueing & demonstrates good technique Exercise tolerated well Personal goals  reviewed Strength training completed today  Goals Unmet:  Not Applicable  Goals Comments: Patient completed exercise prescription and all exercise goals during rehab session. The exercise was tolerated well and the patient is progressing in the program.    Dr. Emily Filbert is Medical Director for [place] and [facility].

## 2015-08-02 ENCOUNTER — Encounter: Payer: Medicare Other | Admitting: *Deleted

## 2015-08-02 DIAGNOSIS — I5023 Acute on chronic systolic (congestive) heart failure: Secondary | ICD-10-CM | POA: Diagnosis not present

## 2015-08-02 DIAGNOSIS — I509 Heart failure, unspecified: Secondary | ICD-10-CM

## 2015-08-02 LAB — GLUCOSE, CAPILLARY: GLUCOSE-CAPILLARY: 194 mg/dL — AB (ref 65–99)

## 2015-08-02 NOTE — Progress Notes (Signed)
Pulmonary Individual Treatment Plan  Patient Details  [name] MRN: [id number] Date of Birth: 03/22/1925 Referring Provider:  Drucker, Michael N, MD  Initial Encounter Date:    Visit Diagnosis: Acute on chronic congestive heart failure, unspecified congestive heart failure type (HCC)  Patient's Home Medications on Admission:  Current outpatient prescriptions:  .  apixaban (ELIQUIS) 5 MG TABS tablet, Take 5 mg by mouth 2 (two) times daily. , Disp: , Rfl:  .  aspirin 81 MG tablet, Take 81 mg by mouth daily., Disp: , Rfl:  .  baclofen (LIORESAL) 10 MG tablet, Take 10 mg by mouth 2 (two) times daily. , Disp: , Rfl:  .  carvedilol (COREG) 6.25 MG tablet, Take 6.25 mg by mouth 2 (two) times daily with a meal. , Disp: , Rfl:  .  docusate sodium (COLACE) 100 MG capsule, Take 100 mg by mouth daily., Disp: , Rfl:  .  escitalopram (LEXAPRO) 10 MG tablet, Take 10 mg by mouth daily., Disp: , Rfl:  .  ferrous sulfate 325 (65 FE) MG tablet, Take 325 mg by mouth daily with breakfast., Disp: , Rfl:  .  furosemide (LASIX) 80 MG tablet, Take 80 mg by mouth every other day. 80mg every other day and 40mg every other day, Disp: , Rfl:  .  glimepiride (AMARYL) 4 MG tablet, Take 4 mg by mouth 2 (two) times daily after a meal., Disp: , Rfl:  .  JANUVIA 100 MG tablet, Take 100 mg by mouth daily., Disp: , Rfl:  .  losartan (COZAAR) 25 MG tablet, Take 25 mg by mouth daily., Disp: , Rfl:  .  Magnesium Oxide -Mg Supplement 400 MG CAPS, Take 400 mg by mouth daily., Disp: , Rfl:  .  Melatonin 1 MG CAPS, Take 1 mg by mouth at bedtime. , Disp: , Rfl:  .  metolazone (ZAROXOLYN) 2.5 MG tablet, Take 2.5 mg by mouth 2 (two) times a week. Takes on Monday and Friday, Disp: , Rfl:  .  oxycodone (OXY-IR) 5 MG capsule, Take 5-10 mg by mouth See admin instructions. 5mg in the morning and mid-afternoon and 10mg at bedtime, Disp: , Rfl:  .  pantoprazole (PROTONIX) 40 MG tablet, Take 40 mg by mouth daily., Disp: ,  Rfl:  .  rOPINIRole (REQUIP) 0.5 MG tablet, Take 0.5 mg by mouth 2 (two) times daily., Disp: , Rfl:  .  spironolactone (ALDACTONE) 25 MG tablet, Take 1 tablet by mouth daily., Disp: , Rfl:  .  vitamin B-12 (CYANOCOBALAMIN) 100 MCG tablet, Take 100 mcg by mouth daily., Disp: , Rfl:  .  vitamin C (ASCORBIC ACID) 500 MG tablet, Take 500 mg by mouth daily., Disp: , Rfl:  .  Vitamin D, Ergocalciferol, (DRISDOL) 50000 UNITS CAPS capsule, Take 50,000 Units by mouth every 7 (seven) days. , Disp: , Rfl:   Past Medical History: Past Medical History  Diagnosis Date  . CHF (congestive heart failure) (HCC)   . Diabetes mellitus without complication (HCC)   . Hypertension   . Chronic back pain     Tobacco Use: History  Smoking status  . Never Smoker   Smokeless tobacco  . Never Used    Labs: Recent Review Flowsheet Data    Labs for ITP Cardiac and Pulmonary Rehab Latest Ref Rng 03/04/2012 03/20/2015   Cholestrol 0-200 mg/dL 101 -   LDLCALC 0-100 mg/dL 60 -   HDL 40-60 mg/dL 23(L) -   Trlycerides 0-200 mg/dL 90 -   Hemoglobin A1c   4.0 - 6.0 % 7.7(H) 7.0(H)         POCT Glucose      05/29/15 1000 05/31/15 1000 07/26/15 1432       POCT Blood Glucose   Pre-Exercise 206 mg/dL  Muffin, grits, and coffee; Fasting Glucose 144 201 mg/dL      Post-Exercise 189 mg/dL 170 mg/dL      Pre-Exercise #3   223 mg/dL     Post-Exercise #3   158 mg/dL        ADL UCSD:     ADL UCSD      05/23/15 1100       ADL UCSD   ADL Phase Entry     SOB Score total 14     Rest 0     Walk 0     Stairs 1     Bath 1     Dress 1     Shop 2         Pulmonary Function Assessment:     Pulmonary Function Assessment - 05/23/15 1100    Initial Spirometry Results   FVC% 47 %   FEV1% 20 %   FEV1/FVC Ratio 100   Breath   Bilateral Breath Sounds Clear;Decreased      Exercise Target Goals:    Exercise Program Goal: Individual exercise prescription set with THRR, safety & activity barriers.  Participant demonstrates ability to understand and report RPE using BORG scale, to self-measure pulse accurately, and to acknowledge the importance of the exercise prescription.  Exercise Prescription Goal: Starting with aerobic activity 30 plus minutes a day, 3 days per week for initial exercise prescription. Provide home exercise prescription and guidelines that participant acknowledges understanding prior to discharge.  Activity Barriers & Risk Stratification:     Activity Barriers & Cardiac Risk Stratification - 05/23/15 1100    Activity Barriers & Cardiac Risk Stratification   Activity Barriers Assistive Device;Deconditioning;Muscular Weakness   Cardiac Risk Stratification Moderate      6 Minute Walk:     6 Minute Walk      05/23/15 1449       6 Minute Walk   Phase Initial     Distance 575 feet     Walk Time 6 minutes     RPE 11     Perceived Dyspnea  2     Symptoms No     Resting HR 74 bpm     Resting BP 118/72 mmHg     Max Ex. HR 77 bpm     Max Ex. BP 124/72 mmHg        Initial Exercise Prescription:     Initial Exercise Prescription - 05/23/15 1400    Date of Initial Exercise Prescription   Date 05/23/15   Treadmill   MPH 1.5   Grade 0   Minutes 10   Recumbant Bike   Level 2   RPM 40   Watts 20   Minutes 10   NuStep   Level 2   Watts 40   Minutes 10   Arm Ergometer   Level 1   Watts 10   Minutes 10   Recumbant Elliptical   Level 1   RPM 40   Watts 20   Minutes 10   REL-XR   Level 2   Watts 40   Minutes 10   T5 Nustep   Level 1   Watts 15   Minutes 10   Biostep-RELP   Level 2   Watts 40     Minutes 10   Prescription Details   Frequency (times per week) 3   Duration Progress to 30 minutes of continuous aerobic without signs/symptoms of physical distress   Intensity   THRR REST +  30   Ratings of Perceived Exertion 11-15   Perceived Dyspnea 2-4   Progression Continue progressive overload as per policy without signs/symptoms or  physical distress.   Resistance Training   Training Prescription Yes   Weight 2   Reps 10-15      Exercise Prescription Changes:     Exercise Prescription Changes      05/29/15 1000 05/31/15 1000 07/26/15 1000 07/31/15 1000     Exercise Review   Progression  No No Yes    Response to Exercise   Blood Pressure (Admit) 120/68 mmHg       Blood Pressure (Exercise) 124/64 mmHg       Blood Pressure (Exit) 122/64 mmHg       Heart Rate (Admit) 77 bpm       Heart Rate (Exercise) 97 bpm       Heart Rate (Exit) 73 bpm       Oxygen Saturation (Admit) 98 %       Oxygen Saturation (Exercise) 91 %       Oxygen Saturation (Exit) 91 %       Rating of Perceived Exertion (Exercise) 13       Perceived Dyspnea (Exercise) 0       Symptoms Omitted Arm Ergometer due to it being painful to back. This is due to chronic pain.  Both knees were sore from fall Some back and knee pain residual from her fall in December Complained of some tiredness today. Completed the exercise with intervals and took rests when she needed them     Comments Patient's first day of class. The initial exercise prescription was discussed with the patient. Exercise machine usage and safety were also discussed with the patient.  Patient had sore knees from a fall so her exercise prescription was adjusted slightly. She was able to perform the same workloads, but had to go slower on while stepping on the NS and BS.  Patient was able to resume previous workloads after being out for over a month which was encouraging to her.  Reviewed individualized exercise prescription and made increases per departmental policy. Exercise increases were discussed with the patient and they were able to perform the new work loads without issue (no signs or symptoms).     Duration Progress to 30 minutes of continuous aerobic without signs/symptoms of physical distress Progress to 30 minutes of continuous aerobic without signs/symptoms of physical distress Progress  to 30 minutes of continuous aerobic without signs/symptoms of physical distress Progress to 30 minutes of continuous aerobic without signs/symptoms of physical distress    Intensity Rest + 30 Rest + 30 Rest + 30 Rest + 30    Progression Continue progressive overload as per policy without signs/symptoms or physical distress. Continue progressive overload as per policy without signs/symptoms or physical distress. Continue progressive overload as per policy without signs/symptoms or physical distress. Continue progressive overload as per policy without signs/symptoms or physical distress.    Resistance Training   Training Prescription Yes Yes Yes Yes    Weight _0 Reps 10-15 10-15 10-15 10-15    NuStep   Level _1 Watts 40 40 20 20    Minutes _2 12  Biostep-RELP   Level _0 Watts 40 40 18 18    Minutes _1 Discharge Exercise Prescription (Final Exercise Prescription Changes):     Exercise Prescription Changes - 07/31/15 1000    Exercise Review   Progression Yes   Response to Exercise   Symptoms Complained of some tiredness today. Completed the exercise with intervals and took rests when she needed them    Comments Reviewed individualized exercise prescription and made increases per departmental policy. Exercise increases were discussed with the patient and they were able to perform the new work loads without issue (no signs or symptoms).    Duration Progress to 30 minutes of continuous aerobic without signs/symptoms of physical distress   Intensity Rest + 30   Progression Continue progressive overload as per policy without signs/symptoms or physical distress.   Resistance Training   Training Prescription Yes   Weight 1   Reps 10-15   NuStep   Level 2   Watts 20   Minutes 12   Biostep-RELP   Level 2   Watts 18   Minutes 10       Nutrition:  Target Goals: Understanding of nutrition guidelines, daily intake of sodium <1523m,  cholesterol <2039m calories 30% from fat and 7% or less from saturated fats, daily to have 5 or more servings of fruits and vegetables.  Biometrics:     Pre Biometrics - 05/23/15 1453    Pre Biometrics   Height 5' 7" (1.702 m)   Weight 165 lb 12.8 oz (75.206 kg)   Waist Circumference 37 inches   Hip Circumference 43 inches   Waist to Hip Ratio 0.86 %   BMI (Calculated) 26       Nutrition Therapy Plan and Nutrition Goals:     Nutrition Therapy & Goals - 05/23/15 1100    Nutrition Therapy   Diet Prefers not to meet with the dietitian      Nutrition Discharge: Rate Your Plate Scores:   Psychosocial: Target Goals: Acknowledge presence or absence of depression, maximize coping skills, provide positive support system. Participant is able to verbalize types and ability to use techniques and skills needed for reducing stress and depression.  Initial Review & Psychosocial Screening:     Initial Psych Review & Screening - 05/23/15 1100    Family Dynamics   Good Support System? Yes   Comments Ms ClPacificoas good support from her husband and states she has no depression.   Barriers   Psychosocial barriers to participate in program There are no identifiable barriers or psychosocial needs.;The patient should benefit from training in stress management and relaxation.   Screening Interventions   Interventions Encouraged to exercise      Quality of Life Scores:     Quality of Life - 05/23/15 1100    Quality of Life Scores   Health/Function Pre 12.2 %   Socioeconomic Pre 20.57 %   Psych/Spiritual Pre 16.29 %   Family Pre 14.4 %   GLOBAL Pre 15.09 %      PHQ-9:     Recent Review Flowsheet Data    [facility] 05/23/2015 04/10/2015   Decreased Interest 1 3   Down, Depressed, Hopeless 2 0   PHQ - 2 Score 3 3   Altered sleeping 3 3   Tired, decreased energy 2 3   Change in appetite 0 0   Feeling bad or failure about  yourself  2 3   Trouble concentrating 0  0   Moving slowly or fidgety/restless 1 0   Suicidal thoughts 1 0   PHQ-9 Score 12 12   Difficult doing work/chores Somewhat difficult Somewhat difficult      Psychosocial Evaluation and Intervention:     Psychosocial Evaluation - 05/31/15 1100    Psychosocial Evaluation & Interventions   Interventions Encouraged to exercise with the program and follow exercise prescription;Relaxation education;Stress management education   Comments Counselor met with Ms. Chiao today for initial psychosocial evaluation.  She is an 82 year old who has congestive heart failure and was recommended to attend this program by her Dr.  Jamesetta So. Carlis Abbott has a strong support system with a spouse of 70 years and several adult children who check in on her by phone daily.  She also has help that comes in 4 hours/day and is a part of a faith community.  Ms. Pong  struggles with chronic back pain subsequent to surgery in 2009.  She reports this pain prevents sound sleep for her, even though she is taking medication for pain at bedtime.  Ms. Buller denies a history of depression or anxiety or current symptoms, other than she admits to "worrying a lot."  she is on Lexapro 10 mg. for this and states it helps some.  She reports her stressors are "worrying" about her spouse who has multiple health issues, including back problems, and her 61 year old son who was paralyzed 7 years ago and lives in [place].  Ms. Bembenek has goals to increase the strength in her legs and be able to "reitre" her walker and get back to her cane for support.  Counselor encouraged her to consistently exercise to accomplish this goal.  Also, she will benefit from the stress management and relaxation psychoeducational components of this program.     Continued Psychosocial Services Needed Yes  Ms. Garinger will benefit from consistent exercise, and psychoeducation on relaxation and stress management particularly.  Counselor will follow up with Ms. Carlis Abbott on these.        Psychosocial Re-Evaluation:     Psychosocial Re-Evaluation      08/02/15 1101           Psychosocial Re-Evaluation   Interventions Encouraged to attend Cardiac Rehabilitation for the exercise       Comments Dizziness at times with exercise so Shyia also said she has chronic back pain that limits her that she wishes she doesn't have but she reports she has learned to live with it.          Education: Education Goals: Education classes will be provided on a weekly basis, covering required topics. Participant will state understanding/return demonstration of topics presented.  Learning Barriers/Preferences:     Learning Barriers/Preferences - 05/23/15 1100    Learning Barriers/Preferences   Learning Barriers None   Learning Preferences Group Instruction;Individual Instruction;Pictoral;Skilled Demonstration;Verbal Instruction;Video;Written Material      Education Topics: Initial Evaluation Education: - Verbal, written and demonstration of respiratory meds, RPE/PD scales, oximetry and breathing techniques. Instruction on use of nebulizers and MDIs: cleaning and proper use, rinsing mouth with steroid doses and importance of monitoring MDI activations.          Pulmonary Rehab from 07/31/2015 in [facility] Cardiac and Pulmonary Rehab   Date  05/23/15   Educator  LB   Instruction Review Code  2- meets goals/outcomes      General Nutrition Guidelines/Fats and Fiber: -Group instruction provided by verbal,  written material, models and posters to present the general guidelines for heart healthy nutrition. Gives an explanation and review of dietary fats and fiber.      Pulmonary Rehab from 07/31/2015 in [facility] Cardiac and Pulmonary Rehab   Date  07/31/15 Marcelina Morel attended 05/29/15]   Educator  CR   Instruction Review Code  2- meets goals/outcomes      Controlling Sodium/Reading Food Labels: -Group verbal and written material supporting the discussion of sodium use in heart healthy nutrition.  Review and explanation with models, verbal and written materials for utilization of the food label.   Exercise Physiology & Risk Factors: - Group verbal and written instruction with models to review the exercise physiology of the cardiovascular system and associated critical values. Details cardiovascular disease risk factors and the goals associated with each risk factor.      Pulmonary Rehab from 07/31/2015 in [facility] Cardiac and Pulmonary Rehab   Date  05/31/15   Educator  SW   Instruction Review Code  2- meets goals/outcomes      Aerobic Exercise & Resistance Training: - Gives group verbal and written discussion on the health impact of inactivity. On the components of aerobic and resistive training programs and the benefits of this training and how to safely progress through these programs.   Flexibility, Balance, General Exercise Guidelines: - Provides group verbal and written instruction on the benefits of flexibility and balance training programs. Provides general exercise guidelines with specific guidelines to those with heart or lung disease. Demonstration and skill practice provided.   Stress Management: - Provides group verbal and written instruction about the health risks of elevated stress, cause of high stress, and healthy ways to reduce stress.   Depression: - Provides group verbal and written instruction on the correlation between heart/lung disease and depressed mood, treatment options, and the stigmas associated with seeking treatment.   Exercise & Equipment Safety: - Individual verbal instruction and demonstration of equipment use and safety with use of the equipment.      Pulmonary Rehab from 07/31/2015 in [facility] Cardiac and Pulmonary Rehab   Date  05/29/15   Educator  LB   Instruction Review Code  2- meets goals/outcomes      Infection Prevention: - Provides verbal and written material to individual with discussion of infection control including proper hand washing  and proper equipment cleaning during exercise session.      Pulmonary Rehab from 07/31/2015 in [facility] Cardiac and Pulmonary Rehab   Date  05/29/15   Educator  LB   Instruction Review Code  2- meets goals/outcomes      Falls Prevention: - Provides verbal and written material to individual with discussion of falls prevention and safety.      Pulmonary Rehab from 07/31/2015 in [facility] Cardiac and Pulmonary Rehab   Date  05/23/15   Educator  LB   Instruction Review Code  2- meets goals/outcomes      Diabetes: - Individual verbal and written instruction to review signs/symptoms of diabetes, desired ranges of glucose level fasting, after meals and with exercise. Advice that pre and post exercise glucose checks will be done for 3 sessions at entry of program.   Chronic Lung Diseases: - Group verbal and written instruction to review new updates, new respiratory medications, new advancements in procedures and treatments. Provide informative websites and "800" numbers of self-education.   Lung Procedures: - Group verbal and written instruction to describe testing methods done to diagnose lung disease. Review the outcome of test  results. Describe the treatment choices: Pulmonary Function Tests, ABGs and oximetry.   Energy Conservation: - Provide group verbal and written instruction for methods to conserve energy, plan and organize activities. Instruct on pacing techniques, use of adaptive equipment and posture/positioning to relieve shortness of breath.   Triggers: - Group verbal and written instruction to review types of environmental controls: home humidity, furnaces, filters, dust mite/pet prevention, HEPA vacuums. To discuss weather changes, air quality and the benefits of nasal washing.   Exacerbations: - Group verbal and written instruction to provide: warning signs, infection symptoms, calling MD promptly, preventive modes, and value of vaccinations. Review: effective airway clearance,  coughing and/or vibration techniques. Create an Sports administrator.   Oxygen: - Individual and group verbal and written instruction on oxygen therapy. Includes supplement oxygen, available portable oxygen systems, continuous and intermittent flow rates, oxygen safety, concentrators, and Medicare reimbursement for oxygen.   Respiratory Medications: - Group verbal and written instruction to review medications for lung disease. Drug class, frequency, complications, importance of spacers, rinsing mouth after steroid MDI's, and proper cleaning methods for nebulizers.   AED/CPR: - Group verbal and written instruction with the use of models to demonstrate the basic use of the AED with the basic ABC's of resuscitation.   Breathing Retraining: - Provides individuals verbal and written instruction on purpose, frequency, and proper technique of diaphragmatic breathing and pursed-lipped breathing. Applies individual practice skills.      Pulmonary Rehab from 07/31/2015 in [facility] Cardiac and Pulmonary Rehab   Date  05/23/15   Educator  LB   Instruction Review Code  2- meets goals/outcomes      Anatomy and Physiology of the Lungs: - Group verbal and written instruction with the use of models to provide basic lung anatomy and physiology related to function, structure and complications of lung disease.   Heart Failure: - Group verbal and written instruction on the basics of heart failure: signs/symptoms, treatments, explanation of ejection fraction, enlarged heart and cardiomyopathy.   Sleep Apnea: - Individual verbal and written instruction to review Obstructive Sleep Apnea. Review of risk factors, methods for diagnosing and types of masks and machines for OSA.   Anxiety: - Provides group, verbal and written instruction on the correlation between heart/lung disease and anxiety, treatment options, and management of anxiety.   Relaxation: - Provides group, verbal and written instruction about the  benefits of relaxation for patients with heart/lung disease. Also provides patients with examples of relaxation techniques.   Knowledge Questionnaire Score:   Personal Goals and Risk Factors at Admission:     Personal Goals and Risk Factors at Admission - 05/23/15 1100    Personal Goals and Risk Factors on Admission   Increase Aerobic Exercise and Physical Activity Yes   Intervention While in program, learn and follow the exercise prescription taught. Start at a low level workload and increase workload after able to maintain previous level for 30 minutes. Increase time before increasing intensity.  Ms Lahman's goal is to increase her stamina for walking. She has a co-pay and therefore we will work her into FF with her [employer].   Understand more about Heart/Pulmonary Disease. Yes   Intervention While in program utilize professionals for any questions, and attend the education sessions. Great websites to use are www.americanheart.org or www.lung.org for reliable information.  Ms Cantrall has CHF and would like to learn more about the disease.   Improve shortness of breath with ADL's No   Develop more efficient breathing techniques such  as purse lipped breathing and diaphragmatic breathing; and practicing self-pacing with activity Yes   Intervention While in program, learn and utilize the specific breathing techniques taught to you. Continue to practice and use the techniques as needed.  Instructed Ms Brostrom on PLB. When she was in Physical Therapy, she learned this technique .   Diabetes Yes   Goal Blood glucose control identified by blood glucose values, HgbA1C. Participant verbalizes understanding of the signs/symptoms of hyper/hypo glycemia, proper foot care and importance of medication and nutrition plan for blood glucose control.   Intervention Provide nutrition & aerobic exercise along with prescribed medications to achieve blood glucose in normal ranges: Fasting 65-99 mg/dL    Hypertension Yes   Goal Participant will see blood pressure controlled within the values of 140/90mm/Hg or within value directed by their physician.   Intervention Provide nutrition & aerobic exercise along with prescribed medications to achieve BP 140/90 or less.      Personal Goals and Risk Factors Review:      Goals and Risk Factor Review      05/29/15 1000 05/31/15 1000 07/31/15 1038 08/02/15 1056     Increase Aerobic Exercise and Physical Activity   Goals Progress/Improvement seen   Yes Yes Yes    Comments  Plan to move Ms Glab into [employer] in about a week - she has a copay of $20.00 with each visit; she has done well on the NS and BioStep with acceptable vital signs. He husband may join FF as well. Ms Williamsen has returned to class after an extended absense due to a fall and knee injury. She stated that her knee is tolerating the exercise well. Options for [employer] classes were discussed with Ms Acklin as she has a $20 copay.  She was interested in continuing with exercise is a [employer] class when there is an opening in the class time she wants.  Neesa said she was dizzy again today but after blood pressure was 110/70 with pulse 76 and non fasting fingerstick blood sugar was 192. (Had breakfast this am).Florabel was able to go exercise on the Biost[employer] sitting down after her dizziness passed after 360 cc of water.     Understand more about Heart/Pulmonary Disease   Goals Progress/Improvement seen     Yes    Comments    I asked Daleah if she is on a fluid restriction since some people with Heart failure are. She reported no so given 360 cc water per above after she c/o dizziness. Lou Kyli denies vertigo history.     Breathing Techniques   Comments Ms Dyk using PLB, more for pain than shortness of breath.       Diabetes   Progress seen towards goals    Yes    Comments    Blood sugar after breakfast today when she c./o dizziness was 192. Lou had breakfast this am.     Hypertension    Progress seen toward goals    Yes    Comments    Blood pressure today when she c/o dizziness was 110/70       Personal Goals Discharge (Final Personal Goals and Risk Factors Review):      Goals and Risk Factor Review - 08/02/15 1056    Increase Aerobic Exercise and Physical Activity   Goals Progress/Improvement seen  Yes   Comments Marleah said she was dizzy again today but after blood pressure was 110/70 with pulse 76 and non fasting fingerstick blood sugar   was 192. (Had breakfast this am).Oliviarose was able to go exercise on the Bi[employer] sitting down after her dizziness passed after 360 cc of water.    Understand more about Heart/Pulmonary Disease   Goals Progress/Improvement seen  Yes   Comments I asked Isma if she is on a fluid restriction since some people with Heart failure are. She reported no so given 360 cc water per above after she c/o dizziness. Jobe Gibbon denies vertigo history.    Diabetes   Progress seen towards goals Yes   Comments Blood sugar after breakfast today when she c./o dizziness was 192. Lou had breakfast this am.    Hypertension   Progress seen toward goals Yes   Comments Blood pressure today when she c/o dizziness was 110/70      ITP Comments:     ITP Comments      06/20/15 1133 07/26/15 1000 07/26/15 1053 07/31/15 1000 07/31/15 1023   ITP Comments Ms Carlis Abbott last attended 05/31/2015. She has had some knee problems, but plans to return the first of the year. Ms Kimble returned to [employer] after a month of resting her lt knee; she exercised 32 minutes and omitted weights; a good first day back to exercise. Personal and exercise goals expected to be met in 32 more sessions. Progress on specific individualized goals will be charted in patient's ITP. Upon completion of the program the patient will be comfortable managing exercise goals and progression on their own.  Ms Wickham left early today; she was tired and had episodes of dizziness; glucous level checked 174; Ms Huizenga is  under a lot of stress with family. Personal and exercise goals expected to be met in 31 more sessions. Progress on specific individualized goals will be charted in patient's ITP. Upon completion of the program the patient will be comfortable managing exercise goals and progression on their own.      08/02/15 1102           ITP Comments Desiree said she was dizzy again today but after blood pressure was 110/70 with pulse 76 and non fasting fingerstick blood sugar was 192. (Had breakfast this am).Saraiya was able to go exercise on the Biostep sitting down after her dizziness passed after 360 cc of water.           Comments: Will ask Raziya to drink water before she exercises in Pulmonary REhab.

## 2015-08-09 ENCOUNTER — Encounter: Payer: Medicare Other | Admitting: *Deleted

## 2015-08-09 DIAGNOSIS — I5023 Acute on chronic systolic (congestive) heart failure: Secondary | ICD-10-CM | POA: Diagnosis not present

## 2015-08-09 DIAGNOSIS — I509 Heart failure, unspecified: Secondary | ICD-10-CM

## 2015-08-09 NOTE — Progress Notes (Signed)
Daily Session Note  Patient Details  Name: Ariya Bohannon MRN: [id number] Date of Birth: 1924/08/28 Referring Provider:  Marlane Hatcher, MD  Encounter Date: 08/09/2015  Check In:     Session Check In - 08/09/15 1059    Check-In   Staff Present Candiss Norse, MS, ACSM CEP, Exercise Physiologist;Laureen Owens Shark, BS, RRT, Respiratory Therapist;Florette Thai, RN, BSN, [place] physician immediately available to respond to emergencies [facility] immediately available ER MD   Physician(s) Drs: Jimmye Norman and Mariea Clonts   Medication changes reported     No   Fall or balance concerns reported    No   Warm-up and Cool-down Performed on first and last piece of equipment   Resistance Training Performed No   VAD Patient? No   Pain Assessment   Currently in Pain? No/denies         Goals Met:  Exercise tolerated well Personal goals reviewed Strength training completed today  Goals Unmet:  Not Applicable  Goals Comments: Doing well with exercise prescription progression.    Dr. Emily Filbert is Medical Director for [facility] and [facility].

## 2015-08-14 ENCOUNTER — Encounter: Payer: Medicare Other | Admitting: *Deleted

## 2015-08-14 DIAGNOSIS — I5023 Acute on chronic systolic (congestive) heart failure: Secondary | ICD-10-CM | POA: Diagnosis not present

## 2015-08-14 DIAGNOSIS — I509 Heart failure, unspecified: Secondary | ICD-10-CM

## 2015-08-14 NOTE — Progress Notes (Signed)
Pulmonary Individual Treatment Plan  Patient Details  [name] MRN: [id number] Date of Birth: [birth year] Referring Provider:  Marlane Hatcher, MD  Initial Encounter Date: 06/21/2015  Visit Diagnosis: Acute on chronic congestive heart failure, unspecified congestive heart failure type ([place])  Patient's Home Medications on Admission:  Current outpatient prescriptions:    apixaban (ELIQUIS) 5 MG TABS tablet, Take 5 mg by mouth 2 (two) times daily. , Disp: , Rfl:    aspirin 81 MG tablet, Take 81 mg by mouth daily., Disp: , Rfl:    baclofen (LIORESAL) 10 MG tablet, Take 10 mg by mouth 2 (two) times daily. , Disp: , Rfl:    carvedilol (COREG) 6.25 MG tablet, Take 6.25 mg by mouth 2 (two) times daily with a meal. , Disp: , Rfl:    docusate sodium (COLACE) 100 MG capsule, Take 100 mg by mouth daily., Disp: , Rfl:    escitalopram (LEXAPRO) 10 MG tablet, Take 10 mg by mouth daily., Disp: , Rfl:    ferrous sulfate 325 (65 FE) MG tablet, Take 325 mg by mouth daily with breakfast., Disp: , Rfl:    furosemide (LASIX) 80 MG tablet, Take 80 mg by mouth every other day. '80mg'$  every other day and '40mg'$  every other day, Disp: , Rfl:    glimepiride (AMARYL) 4 MG tablet, Take 4 mg by mouth 2 (two) times daily after a meal., Disp: , Rfl:    JANUVIA 100 MG tablet, Take 100 mg by mouth daily., Disp: , Rfl:    losartan (COZAAR) 25 MG tablet, Take 25 mg by mouth daily., Disp: , Rfl:    Magnesium Oxide -Mg Supplement 400 MG CAPS, Take 400 mg by mouth daily., Disp: , Rfl:    Melatonin 1 MG CAPS, Take 1 mg by mouth at bedtime. , Disp: , Rfl:    metolazone (ZAROXOLYN) 2.5 MG tablet, Take 2.5 mg by mouth 2 (two) times a week. Takes on Monday and Friday, Disp: , Rfl:    oxycodone (OXY-IR) 5 MG capsule, Take 5-10 mg by mouth See admin instructions. '5mg'$  in the morning and mid-afternoon and '10mg'$  at bedtime, Disp: , Rfl:    pantoprazole (PROTONIX) 40 MG tablet, Take 40 mg by mouth daily.,  Disp: , Rfl:    rOPINIRole (REQUIP) 0.5 MG tablet, Take 0.5 mg by mouth 2 (two) times daily., Disp: , Rfl:    spironolactone (ALDACTONE) 25 MG tablet, Take 1 tablet by mouth daily., Disp: , Rfl:    vitamin B-12 (CYANOCOBALAMIN) 100 MCG tablet, Take 100 mcg by mouth daily., Disp: , Rfl:    vitamin C (ASCORBIC ACID) 500 MG tablet, Take 500 mg by mouth daily., Disp: , Rfl:    Vitamin D, Ergocalciferol, (DRISDOL) 50000 UNITS CAPS capsule, Take 50,000 Units by mouth every 7 (seven) days. , Disp: , Rfl:   Past Medical History: Past Medical History  Diagnosis Date   CHF (congestive heart failure) ([place])    Diabetes mellitus without complication ([place])    Hypertension    Chronic back pain     Tobacco Use: History  Smoking status   Never Smoker   Smokeless tobacco   Never Used    Labs: Recent Review Flowsheet Data    Labs for ITP Cardiac and Pulmonary Rehab Latest Ref Rng 03/04/2012 03/20/2015   Cholestrol 0-200 mg/dL 101 -   LDLCALC 0-100 mg/dL 60 -   HDL 40-60 mg/dL 23(L) -   Trlycerides 0-200 mg/dL 90 -   Hemoglobin A1c 4.0 -  6.0 % 7.7(H) 7.0(H)         POCT Glucose      05/29/15 1000 05/31/15 1000 07/26/15 1432       POCT Blood Glucose   Pre-Exercise 206 mg/dL  Muffin, grits, and coffee; Fasting Glucose 144 201 mg/dL      Post-Exercise 189 mg/dL 170 mg/dL      Pre-Exercise #3   223 mg/dL     Post-Exercise #3   158 mg/dL        ADL UCSD:     ADL UCSD      05/23/15 1100       ADL UCSD   ADL Phase Entry     SOB Score total 14     Rest 0     Walk 0     Stairs 1     Bath 1     Dress 1     Shop 2         Pulmonary Function Assessment:     Pulmonary Function Assessment - 05/23/15 1100    Initial Spirometry Results   FVC% 47 %   FEV1% 20 %   FEV1/FVC Ratio 100   Breath   Bilateral Breath Sounds Clear;Decreased      Exercise Target Goals:    Exercise Program Goal: Individual exercise prescription set with THRR, safety & activity barriers.  Participant demonstrates ability to understand and report RPE using BORG scale, to self-measure pulse accurately, and to acknowledge the importance of the exercise prescription.  Exercise Prescription Goal: Starting with aerobic activity 30 plus minutes a day, 3 days per week for initial exercise prescription. Provide home exercise prescription and guidelines that participant acknowledges understanding prior to discharge.  Activity Barriers & Risk Stratification:     Activity Barriers & Cardiac Risk Stratification - 05/23/15 1100    Activity Barriers & Cardiac Risk Stratification   Activity Barriers Assistive Device;Deconditioning;Muscular Weakness   Cardiac Risk Stratification Moderate      6 Minute Walk:     6 Minute Walk      05/23/15 1449       6 Minute Walk   Phase Initial     Distance 575 feet     Walk Time 6 minutes     RPE 11     Perceived Dyspnea  2     Symptoms No     Resting HR 74 bpm     Resting BP 118/72 mmHg     Max Ex. HR 77 bpm     Max Ex. BP 124/72 mmHg        Initial Exercise Prescription:     Initial Exercise Prescription - 05/23/15 1400    Date of Initial Exercise Prescription   Date 05/23/15   Treadmill   MPH 1.5   Grade 0   Minutes 10   Recumbant Bike   Level 2   RPM 40   Watts 20   Minutes 10   NuStep   Level 2   Watts 40   Minutes 10   Arm Ergometer   Level 1   Watts 10   Minutes 10   Recumbant Elliptical   Level 1   RPM 40   Watts 20   Minutes 10   REL-XR   Level 2   Watts 40   Minutes 10   T5 Nustep   Level 1   Watts 15   Minutes 10   Biostep-RELP   Level 2   Watts 40  Minutes 10   Prescription Details   Frequency (times per week) 3   Duration Progress to 30 minutes of continuous aerobic without signs/symptoms of physical distress   Intensity   THRR REST +  30   Ratings of Perceived Exertion 11-15   Perceived Dyspnea 2-4   Progression Continue progressive overload as per policy without signs/symptoms or  physical distress.   Resistance Training   Training Prescription Yes   Weight 2   Reps 10-15      Exercise Prescription Changes:     Exercise Prescription Changes      05/29/15 1000 05/31/15 1000 07/26/15 1000 07/31/15 1000 08/09/15 1300   Exercise Review   Progression  No No Yes Yes   Response to Exercise   Blood Pressure (Admit) 120/68 mmHg    120/70 mmHg   Blood Pressure (Exercise) 124/64 mmHg    118/62 mmHg   Blood Pressure (Exit) 122/64 mmHg    90/62 mmHg   Heart Rate (Admit) 77 bpm    75 bpm   Heart Rate (Exercise) 97 bpm    91 bpm   Heart Rate (Exit) 73 bpm    75 bpm   Oxygen Saturation (Admit) 98 %    94 %   Oxygen Saturation (Exercise) 91 %    92 %   Oxygen Saturation (Exit) 91 %    90 %   Rating of Perceived Exertion (Exercise) 13    11   Perceived Dyspnea (Exercise) 0    0   Symptoms Omitted Arm Ergometer due to it being painful to back. This is due to chronic pain.  Both knees were sore from fall Some back and knee pain residual from her fall in December Complained of some tiredness today. Completed the exercise with intervals and took rests when she needed them  Chronic pain   Comments Patient's first day of class. The initial exercise prescription was discussed with the patient. Exercise machine usage and safety were also discussed with the patient.  Patient had sore knees from a fall so her exercise prescription was adjusted slightly. She was able to perform the same workloads, but had to go slower on while stepping on the NS and BS.  Patient was able to resume previous workloads after being out for over a month which was encouraging to her.  Reviewed individualized exercise prescription and made increases per departmental policy. Exercise increases were discussed with the patient and they were able to perform the new work loads without issue (no signs or symptoms).  [name] is making small progressions. Her back and knee pain and overall fragility make more aggressive  progression not possible and not practical. [name] is using time intervals to exercise for longer periods and to build her endurance. She cannot exercise continuously for more than 10-15 minutes and our first goals is to steadily increase her time and build her endurance.    Duration Progress to 30 minutes of continuous aerobic without signs/symptoms of physical distress Progress to 30 minutes of continuous aerobic without signs/symptoms of physical distress Progress to 30 minutes of continuous aerobic without signs/symptoms of physical distress Progress to 30 minutes of continuous aerobic without signs/symptoms of physical distress Progress to 30 minutes of continuous aerobic without signs/symptoms of physical distress   Intensity Rest + 30 Rest + 30 Rest + 30 Rest + 30 Rest + 30   Progression Continue progressive overload as per policy without signs/symptoms or physical distress. Continue progressive overload as per policy without signs/symptoms  or physical distress. Continue progressive overload as per policy without signs/symptoms or physical distress. Continue progressive overload as per policy without signs/symptoms or physical distress. Continue progressive overload as per policy without signs/symptoms or physical distress.   Resistance Training   Training Prescription Yes Yes Yes Yes Yes   Weight '1 1 1 1 1   '$ Reps 10-15 10-15 10-15 10-15 10-15   NuStep   Level '2 2 2 2 2   '$ Watts 40 40 '20 20 20   '$ Minutes '10 10 10 12 12   '$ Biostep-RELP   Level '2 2 2 2 2   '$ Watts 40 40 '18 18 20   '$ Minutes '10 10 10 10 10     '$ 08/14/15 1000           Response to Exercise   Symptoms Chronic pain       Comments Maintaining time increases and doing well on the machines.        Duration Progress to 30 minutes of continuous aerobic without signs/symptoms of physical distress       Intensity Rest + 30       Progression Continue progressive overload as per policy without signs/symptoms or physical distress.        Resistance Training   Training Prescription Yes       Weight 1       Reps 10-15       NuStep   Level 2       Watts 20       Minutes 12       Biostep-RELP   Level 2       Watts 20       Minutes 10          Discharge Exercise Prescription (Final Exercise Prescription Changes):     Exercise Prescription Changes - 08/14/15 1000    Response to Exercise   Symptoms Chronic pain   Comments Maintaining time increases and doing well on the machines.    Duration Progress to 30 minutes of continuous aerobic without signs/symptoms of physical distress   Intensity Rest + 30   Progression Continue progressive overload as per policy without signs/symptoms or physical distress.   Resistance Training   Training Prescription Yes   Weight 1   Reps 10-15   NuStep   Level 2   Watts 20   Minutes 12   Biostep-RELP   Level 2   Watts 20   Minutes 10       Nutrition:  Target Goals: Understanding of nutrition guidelines, daily intake of sodium '1500mg'$ , cholesterol '200mg'$ , calories 30% from fat and 7% or less from saturated fats, daily to have 5 or more servings of fruits and vegetables.  Biometrics:     Pre Biometrics - 05/23/15 1453    Pre Biometrics   Height '5\' 7"'$  (1.702 m)   Weight 165 lb 12.8 oz (75.206 kg)   Waist Circumference 37 inches   Hip Circumference 43 inches   Waist to Hip Ratio 0.86 %   BMI (Calculated) 26       Nutrition Therapy Plan and Nutrition Goals:     Nutrition Therapy & Goals - 05/23/15 1100    Nutrition Therapy   Diet Prefers not to meet with the dietitian      Nutrition Discharge: Rate Your Plate Scores:   Psychosocial: Target Goals: Acknowledge presence or absence of depression, maximize coping skills, provide positive support system. Participant is able to verbalize types and ability to use  techniques and skills needed for reducing stress and depression.  Initial Review & Psychosocial Screening:     Initial Psych Review & Screening -  05/23/15 1100    Family Dynamics   Good Support System? Yes   Comments [name] has good support from her husband and states she has no depression.   Barriers   Psychosocial barriers to participate in program There are no identifiable barriers or psychosocial needs.;The patient should benefit from training in stress management and relaxation.   Screening Interventions   Interventions Encouraged to exercise      Quality of Life Scores:     Quality of Life - 05/23/15 1100    Quality of Life Scores   Health/Function Pre 12.2 %   Socioeconomic Pre 20.57 %   Psych/Spiritual Pre 16.29 %   Family Pre 14.4 %   GLOBAL Pre 15.09 %      PHQ-9:     Recent Review Flowsheet Data    Depression screen [facility] 2/9 05/23/2015 04/10/2015   Decreased Interest 1 3   Down, Depressed, Hopeless 2 0   PHQ - 2 Score 3 3   Altered sleeping 3 3   Tired, decreased energy 2 3   Change in appetite 0 0   Feeling bad or failure about yourself  2 3   Trouble concentrating 0 0   Moving slowly or fidgety/restless 1 0   Suicidal thoughts 1 0   PHQ-9 Score 12 12   Difficult doing work/chores Somewhat difficult Somewhat difficult      Psychosocial Evaluation and Intervention:     Psychosocial Evaluation - 05/31/15 1100    Psychosocial Evaluation & Interventions   Interventions Encouraged to exercise with the program and follow exercise prescription;Relaxation education;Stress management education   Comments Counselor met with [name] today for initial psychosocial evaluation.  She is an 82 year old who has congestive heart failure and was recommended to attend this program by her Dr.  Jamesetta So. [name] has a strong support system with a spouse of 71 years and several adult children who check in on her by phone daily.  She also has help that comes in 4 hours/day and is a part of a faith community.  [name]  struggles with chronic back pain subsequent to surgery in 2009.  She reports this pain prevents sound sleep  for her, even though she is taking medication for pain at bedtime.  [name] denies a history of depression or anxiety or current symptoms, other than she admits to "worrying a lot."  she is on Lexapro 10 mg. for this and states it helps some.  She reports her stressors are "worrying" about her spouse who has multiple health issues, including back problems, and her 35 year old son who was paralyzed 7 years ago and lives in [place].  [name] has goals to increase the strength in her legs and be able to "reitre" her walker and get back to her cane for support.  Counselor encouraged her to consistently exercise to accomplish this goal.  Also, she will benefit from the stress management and relaxation psychoeducational components of this program.     Continued Psychosocial Services Needed Yes  [name] will benefit from consistent exercise, and psychoeducation on relaxation and stress management particularly.  Counselor will follow up with Sarah. [name] on these.       Psychosocial Re-Evaluation:     Psychosocial Re-Evaluation      08/02/15 1101 08/14/15 1057  Psychosocial Re-Evaluation   Interventions Encouraged to attend Cardiac Rehabilitation for the exercise       Comments Dizziness at times with exercise so [place] also said she has chronic back pain that limits her that she wishes she doesn't have but she reports she has learned to live with it.  Counselor met with Sarah. Hoffman for follow up evaluation.  She reports some increased strength since beginning this class.  She continues to worry a great deal about her spouse's health which has decilined.  She continues to have good support with help that comes in 3 times each week.  Her daughter from [place] will be coming this Wednesday to  support her spouse due to his medical procedures scheduled for this Friday.  Sarah. Mckinstry states her grandson is getting married April 1st and she is hoping she and spouse will be well enough to attend.  Counselor  will continue to follow with [name] in the future.          Education: Education Goals: Education classes will be provided on a weekly basis, covering required topics. Participant will state understanding/return demonstration of topics presented.  Learning Barriers/Preferences:     Learning Barriers/Preferences - 05/23/15 1100    Learning Barriers/Preferences   Learning Barriers None   Learning Preferences Group Instruction;Individual Instruction;Pictoral;Skilled Demonstration;Verbal Instruction;Video;Written Material      Education Topics: Initial Evaluation Education: - Verbal, written and demonstration of respiratory meds, RPE/PD scales, oximetry and breathing techniques. Instruction on use of nebulizers and MDIs: cleaning and proper use, rinsing mouth with steroid doses and importance of monitoring MDI activations.          Pulmonary Rehab from 07/31/2015 in [facility] Cardiac and Pulmonary Rehab   Date  05/23/15   Educator  LB   Instruction Review Code  2- meets goals/outcomes      General Nutrition Guidelines/Fats and Fiber: -Group instruction provided by verbal, written material, models and posters to present the general guidelines for heart healthy nutrition. Gives an explanation and review of dietary fats and fiber.      Pulmonary Rehab from 07/31/2015 in [facility] Cardiac and Pulmonary Rehab   Date  07/31/15 [name] attended 05/29/15]   Educator  CR   Instruction Review Code  2- meets goals/outcomes      Controlling Sodium/Reading Food Labels: -Group verbal and written material supporting the discussion of sodium use in heart healthy nutrition. Review and explanation with models, verbal and written materials for utilization of the food label.   Exercise Physiology & Risk Factors: - Group verbal and written instruction with models to review the exercise physiology of the cardiovascular system and associated critical values. Details cardiovascular disease risk factors and the goals  associated with each risk factor.      Pulmonary Rehab from 07/31/2015 in [facility] Cardiac and Pulmonary Rehab   Date  05/31/15   Educator  SW   Instruction Review Code  2- meets goals/outcomes      Aerobic Exercise & Resistance Training: - Gives group verbal and written discussion on the health impact of inactivity. On the components of aerobic and resistive training programs and the benefits of this training and how to safely progress through these programs.   Flexibility, Balance, General Exercise Guidelines: - Provides group verbal and written instruction on the benefits of flexibility and balance training programs. Provides general exercise guidelines with specific guidelines to those with heart or lung disease. Demonstration and skill practice provided.   Stress Management: - Provides group  verbal and written instruction about the health risks of elevated stress, cause of high stress, and healthy ways to reduce stress.   Depression: - Provides group verbal and written instruction on the correlation between heart/lung disease and depressed mood, treatment options, and the stigmas associated with seeking treatment.   Exercise & Equipment Safety: - Individual verbal instruction and demonstration of equipment use and safety with use of the equipment.      Pulmonary Rehab from 07/31/2015 in [facility] Cardiac and Pulmonary Rehab   Date  05/29/15   Educator  LB   Instruction Review Code  2- meets goals/outcomes      Infection Prevention: - Provides verbal and written material to individual with discussion of infection control including proper hand washing and proper equipment cleaning during exercise session.      Pulmonary Rehab from 07/31/2015 in [facility] Cardiac and Pulmonary Rehab   Date  05/29/15   Educator  LB   Instruction Review Code  2- meets goals/outcomes      Falls Prevention: - Provides verbal and written material to individual with discussion of falls prevention and safety.       Pulmonary Rehab from 07/31/2015 in [facility] Cardiac and Pulmonary Rehab   Date  05/23/15   Educator  LB   Instruction Review Code  2- meets goals/outcomes      Diabetes: - Individual verbal and written instruction to review signs/symptoms of diabetes, desired ranges of glucose level fasting, after meals and with exercise. Advice that pre and post exercise glucose checks will be done for 3 sessions at entry of program.   Chronic Lung Diseases: - Group verbal and written instruction to review new updates, new respiratory medications, new advancements in procedures and treatments. Provide informative websites and "800" numbers of self-education.   Lung Procedures: - Group verbal and written instruction to describe testing methods done to diagnose lung disease. Review the outcome of test results. Describe the treatment choices: Pulmonary Function Tests, ABGs and oximetry.   Energy Conservation: - Provide group verbal and written instruction for methods to conserve energy, plan and organize activities. Instruct on pacing techniques, use of adaptive equipment and posture/positioning to relieve shortness of breath.   Triggers: - Group verbal and written instruction to review types of environmental controls: home humidity, furnaces, filters, dust mite/pet prevention, HEPA vacuums. To discuss weather changes, air quality and the benefits of nasal washing.   Exacerbations: - Group verbal and written instruction to provide: warning signs, infection symptoms, calling MD promptly, preventive modes, and value of vaccinations. Review: effective airway clearance, coughing and/or vibration techniques. Create an Sports administrator.   Oxygen: - Individual and group verbal and written instruction on oxygen therapy. Includes supplement oxygen, available portable oxygen systems, continuous and intermittent flow rates, oxygen safety, concentrators, and Medicare reimbursement for oxygen.   Respiratory Medications: -  Group verbal and written instruction to review medications for lung disease. Drug class, frequency, complications, importance of spacers, rinsing mouth after steroid MDI's, and proper cleaning methods for nebulizers.   AED/CPR: - Group verbal and written instruction with the use of models to demonstrate the basic use of the AED with the basic ABC's of resuscitation.   Breathing Retraining: - Provides individuals verbal and written instruction on purpose, frequency, and proper technique of diaphragmatic breathing and pursed-lipped breathing. Applies individual practice skills.      Pulmonary Rehab from 07/31/2015 in [facility] Cardiac and Pulmonary Rehab   Date  05/23/15   Educator  LB   Instruction Review  Code  2- meets goals/outcomes      Anatomy and Physiology of the Lungs: - Group verbal and written instruction with the use of models to provide basic lung anatomy and physiology related to function, structure and complications of lung disease.   Heart Failure: - Group verbal and written instruction on the basics of heart failure: signs/symptoms, treatments, explanation of ejection fraction, enlarged heart and cardiomyopathy.   Sleep Apnea: - Individual verbal and written instruction to review Obstructive Sleep Apnea. Review of risk factors, methods for diagnosing and types of masks and machines for OSA.   Anxiety: - Provides group, verbal and written instruction on the correlation between heart/lung disease and anxiety, treatment options, and management of anxiety.   Relaxation: - Provides group, verbal and written instruction about the benefits of relaxation for patients with heart/lung disease. Also provides patients with examples of relaxation techniques.   Knowledge Questionnaire Score:   Personal Goals and Risk Factors at Admission:     Personal Goals and Risk Factors at Admission - 05/23/15 1100    Personal Goals and Risk Factors on Admission   Increase Aerobic Exercise  and Physical Activity Yes   Intervention While in program, learn and follow the exercise prescription taught. Start at a low level workload and increase workload after able to maintain previous level for 30 minutes. Increase time before increasing intensity.  [name]'s goal is to increase her stamina for walking. She has a co-pay and therefore we will work her into FF with her [employer].   Understand more about Heart/Pulmonary Disease. Yes   Intervention While in program utilize professionals for any questions, and attend the education sessions. Great websites to use are www.americanheart.org or www.lung.org for reliable information.  [name] has CHF and would like to learn more about the disease.   Improve shortness of breath with ADL's No   Develop more efficient breathing techniques such as purse lipped breathing and diaphragmatic breathing; and practicing self-pacing with activity Yes   Intervention While in program, learn and utilize the specific breathing techniques taught to you. Continue to practice and use the techniques as needed.  Instructed [name] on PLB. When she was in Physical Therapy, she learned this technique .   Diabetes Yes   Goal Blood glucose control identified by blood glucose values, HgbA1C. Participant verbalizes understanding of the signs/symptoms of hyper/hypo glycemia, proper foot care and importance of medication and nutrition plan for blood glucose control.   Intervention Provide nutrition & aerobic exercise along with prescribed medications to achieve blood glucose in normal ranges: Fasting 65-99 mg/dL   Hypertension Yes   Goal Participant will see blood pressure controlled within the values of 140/46m/Hg or within value directed by their physician.   Intervention Provide nutrition & aerobic exercise along with prescribed medications to achieve BP 140/90 or less.      Personal Goals and Risk Factors Review:      Goals and Risk Factor Review       05/29/15 1000 05/31/15 1000 07/31/15 1038 08/02/15 1000 08/02/15 1056   Increase Aerobic Exercise and Physical Activity   Goals Progress/Improvement seen   Yes Yes  Yes   Comments  Plan to move Sarah CCarlis Abbottinto FDillard'sin about a week - she has a copay of $20.00 with each visit; she has done well on the NS and BioStep with acceptable vital signs. He husband may join FF as well. Sarah CDeckmanhas returned to class after an extended absense  due to a fall and knee injury. She stated that her knee is tolerating the exercise well. Options for [employer] classes were discussed with [name] as she has a $20 copay.  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  Explained to [name] that there is a waiting list for the 1:15 FF; she will continue in LW and wait for the opening. [name] said she was dizzy again today but after blood pressure was 110/70 with pulse 76 and non fasting fingerstick blood sugar was 192. (Had breakfast this am).[name] was able to go exercise on the Biostep sitting down after her dizziness passed after 360 cc of water.    Understand more about Heart/Pulmonary Disease   Goals Progress/Improvement seen      Yes   Comments     I asked [name] if she is on a fluid restriction since some people with Heart failure are. She reported no so given 360 cc water per above after she c/o dizziness. Leonie Douglas denies vertigo history.    Breathing Techniques   Comments Sarah Stuber using PLB, more for pain than shortness of breath.       Diabetes   Progress seen towards goals     Yes   Comments     Blood sugar after breakfast today when she c./o dizziness was 192. Lou had breakfast this am.    Hypertension   Progress seen toward goals     Yes   Comments     Blood pressure today when she c/o dizziness was 110/70     08/02/15 1121 08/09/15 1101         Improve shortness of breath with ADL's   Goals Progress/Improvement seen   Met       Comments  [name] states she does not experience SOB symptoms. She stated that she does tire easily. She does stop when she is tired and will slow down and pace herself to prevent herself from gettin overtired.  She hopes to see an increase in  stamina as she continues with the exercise prescription with the program.      Breathing Techniques   Goals Progress/Improvement seen   Yes      Comments  Reviewed the technique for PLB with [name] today.  She stared and demonstrated good PLB technique.  Reminded her to use this throughout her exercise and when exerting herself in any activity.      Diabetes   Goal Blood glucose control identified by blood glucose values, HgbA1C. Participant verbalizes understanding of the signs/symptoms of hyper/hypo glycemia, proper foot care and importance of medication and nutrition plan for blood glucose control.       Progress seen towards goals Yes       Comments Lou sysates her A1c is around 7.4%.  She has maintained good control with ther blood sugars and is aware of her meds and nutriton/exercise requirements to help maintain good Blood sugar contro.       Hypertension   Goal Participant will see blood pressure controlled within the values of 140/28mm/Hg or within value directed by their physician.       Progress seen toward goals Yes       Comments BP ranges are in good ranges by chart review.  [name] talked about keeping up with her weight every day and when to call her MD about 3 pounds weight gain. "I take  Lasix 80 mg one day and then 40 mg the next"    York Cerise is aware of what she needs to do by keeping up with er meds/nutrition and exercise to help keep her BP in [place] range.           Personal Goals Discharge (Final Personal Goals and Risk Factors Review):      Goals and Risk Factor Review - 08/09/15 1101    Improve shortness of breath with ADL's   Goals Progress/Improvement seen  Met   Comments York Cerise states she does not experience SOB symptoms. She stated that she  does tire easily. She does stop when she is tired and will slow down and pace herself to prevent herself from gettin overtired.  She hopes to see an increase in  stamina as she continues with the exercise prescription with the program.   Breathing Techniques   Goals Progress/Improvement seen  Yes   Comments Reviewed the technique for PLB with York Cerise today.  She stared and demonstrated good PLB technique.  Reminded her to use this throughout her exercise and when exerting herself in any activity.      ITP Comments:     ITP Comments      06/20/15 1133 07/26/15 1000 07/26/15 1053 07/31/15 1000 07/31/15 1023   ITP Comments Sarah Lemar last attended 05/31/2015. She has had some knee problems, but plans to return the first of the year. Sarah Schoenfelder returned to [employer] after a month of resting her lt knee; she exercised 32 minutes and omitted weights; a good first day back to exercise. Personal and exercise goals expected to be met in 32 more sessions. Progress on specific individualized goals will be charted in patient's ITP. Upon completion of the program the patient will be comfortable managing exercise goals and progression on their own.  Sarah Dorrough left early today; she was tired and had episodes of dizziness; glucous level checked 174; Sarah Womac is under a lot of stress with family. Personal and exercise goals expected to be met in 31 more sessions. Progress on specific individualized goals will be charted in patient's ITP. Upon completion of the program the patient will be comfortable managing exercise goals and progression on their own.      08/02/15 1102 08/09/15 1100 08/14/15 1035       ITP Comments Stanton Kidney said she was dizzy again today but after blood pressure was 110/70 with pulse 76 and non fasting fingerstick blood sugar was 192. (Had breakfast this am).Aaylah was able to go exercise on the Biostep sitting down after her dizziness passed after 360 cc of water.  Expected to meet program and personal goals by  end of program in 29 more sessions. Personal and exercise goals expected to be met in 28 more sessions. Progress on specific individualized goals will be charted in patient's ITP. Upon completion of the program the patient will be comfortable managing exercise goals and progression on their own.         Comments: 30 day review note

## 2015-08-14 NOTE — Progress Notes (Signed)
Daily Session Note  Patient Details  [name] MRN: [id number] Date of Birth: 06-15-1925 Referring Provider:  Marlane Hatcher, MD  Encounter Date: 08/14/2015  Check In:     Session Check In - 08/14/15 1032    Check-In   Location [facility]   Staff Present Candiss Norse, MS, ACSM CEP, Exercise Physiologist;Steven Way, BS, ACSM EP-C, Exercise Physiologist;Laureen Owens Shark, BS, RRT, Respiratory Therapist;Kelly Amedeo Plenty, BS, ACSM CEP, Exercise Physiologist   Supervising physician immediately available to respond to emergencies [facility] immediately available ER MD   Physician(s) Owens Shark and Reita Cliche   Medication changes reported     No   Fall or balance concerns reported    No   Warm-up and Cool-down Performed on first and last piece of equipment   Resistance Training Performed Yes   VAD Patient? No   Pain Assessment   Currently in Pain? No/denies   Multiple Pain Sites No           Exercise Prescription Changes - 08/14/15 1000    Response to Exercise   Symptoms Chronic pain   Comments Maintaining time increases and doing well on the machines.    Duration Progress to 30 minutes of continuous aerobic without signs/symptoms of physical distress   Intensity Rest + 30   Progression Continue progressive overload as per policy without signs/symptoms or physical distress.   Resistance Training   Training Prescription Yes   Weight 1   Reps 10-15   NuStep   Level 2   Watts 20   Minutes 12   Biostep-RELP   Level 2   Watts 20   Minutes 10      Goals Met:  Proper associated with RPD/PD & O2 Sat Independence with exercise equipment Using PLB without cueing & demonstrates good technique Exercise tolerated well Personal goals reviewed Strength training completed today  Goals Unmet:  Not Applicable  Goals Comments: Patient completed exercise prescription and all exercise goals during rehab session. The exercise was tolerated well and the patient  is progressing in the program.    Dr. Emily Filbert is Medical Director for [place] and [facility].

## 2015-08-16 ENCOUNTER — Encounter: Payer: Medicare Other | Admitting: *Deleted

## 2015-08-16 DIAGNOSIS — I5023 Acute on chronic systolic (congestive) heart failure: Secondary | ICD-10-CM | POA: Diagnosis not present

## 2015-08-16 DIAGNOSIS — I509 Heart failure, unspecified: Secondary | ICD-10-CM

## 2015-08-16 NOTE — Progress Notes (Signed)
Daily Session Note  Patient Details  [name] MRN: [id number] Date of Birth: [birth year] Referring Provider:  Marlane Hatcher, MD  Encounter Date: 08/16/2015  Check In:     Session Check In - 08/16/15 1057    Check-In   Location [facility]   Staff Present Candiss Norse, MS, ACSM CEP, Exercise Physiologist;Steven Way, BS, ACSM EP-C, Exercise Physiologist;Laureen Owens Shark, BS, RRT, Respiratory Therapist   Supervising physician immediately available to respond to emergencies [facility] immediately available ER MD   Physician(s) Owens Shark and Archie Balboa   Medication changes reported     No   Fall or balance concerns reported    No   Warm-up and Cool-down Performed on first and last piece of equipment   Resistance Training Performed Yes   VAD Patient? No   Pain Assessment   Currently in Pain? Yes   Pain Location Back   Pain Type Chronic pain   Multiple Pain Sites No           Exercise Prescription Changes - 08/16/15 1000    Exercise Review   Progression Yes   Response to Exercise   Symptoms Chronic pain   Comments [name] is showing progress in her stamina and can tolerate longer exercise times on the machines.    Duration Progress to 30 minutes of continuous aerobic without signs/symptoms of physical distress   Intensity Rest + 30   Progression Continue progressive overload as per policy without signs/symptoms or physical distress.   Resistance Training   Training Prescription Yes   Weight 1   Reps 10-15   NuStep   Level 2   Watts 20   Minutes 12   Biostep-RELP   Level 2   Watts 20   Minutes 15      Goals Met:  Proper associated with RPD/PD & O2 Sat Independence with exercise equipment Using PLB without cueing & demonstrates good technique Exercise tolerated well Personal goals reviewed Strength training completed today  Goals Unmet:  Not Applicable  Goals Comments: Patient completed exercise prescription and all exercise goals  during rehab session. The exercise was tolerated well and the patient is progressing in the program.    Dr. Emily Filbert is Medical Director for [place] and [facility].

## 2015-08-18 ENCOUNTER — Encounter: Payer: Medicare Other | Admitting: *Deleted

## 2015-08-22 ENCOUNTER — Ambulatory Visit
Admission: RE | Admit: 2015-08-22 | Discharge: 2015-08-22 | Disposition: A | Discharge status: Home / Self Care | Payer: Medicare Other | Source: Ambulatory Visit | Attending: Oncology | Admitting: Oncology

## 2015-08-22 DIAGNOSIS — R918 Other nonspecific abnormal finding of lung field: Secondary | ICD-10-CM | POA: Diagnosis not present

## 2015-08-22 DIAGNOSIS — R911 Solitary pulmonary nodule: Secondary | ICD-10-CM | POA: Diagnosis not present

## 2015-08-22 DIAGNOSIS — R590 Localized enlarged lymph nodes: Secondary | ICD-10-CM | POA: Diagnosis not present

## 2015-08-22 DIAGNOSIS — I7090 Unspecified atherosclerosis: Secondary | ICD-10-CM | POA: Insufficient documentation

## 2015-08-22 HISTORY — DX: Malignant (primary) neoplasm, unspecified: C80.1

## 2015-08-22 LAB — POCT I-STAT CREATININE: Creatinine, Ser: 1.1 mg/dL — ABNORMAL HIGH (ref 0.44–1.00)

## 2015-08-22 MED ORDER — IOHEXOL 300 MG/ML  SOLN
60.0000 mL | Freq: Once | INTRAMUSCULAR | Status: AC | PRN
  Administered 2015-08-22: 60 mL via INTRAVENOUS

## 2015-08-23 ENCOUNTER — Encounter: Payer: Medicare Other | Attending: Cardiology

## 2015-08-23 DIAGNOSIS — I5023 Acute on chronic systolic (congestive) heart failure: Secondary | ICD-10-CM | POA: Diagnosis present

## 2015-08-23 DIAGNOSIS — I509 Heart failure, unspecified: Secondary | ICD-10-CM

## 2015-08-23 NOTE — Progress Notes (Signed)
Daily Session Note  Patient Details  Name: Gracee Ratterree MRN: [id number] Date of Birth: December 25, 1924 Referring Provider:  Marlane Hatcher, MD  Encounter Date: 08/23/2015  Check In:     Session Check In - 08/23/15 1136    Check-In   Location [facility]   Staff Present Heath Lark, RN, BSN, CCRP;Nikholas Geffre, BS, ACSM EP-C, Exercise Physiologist;Laureen Owens Shark, BS, RRT, Respiratory Therapist   Supervising physician immediately available to respond to emergencies [facility] immediately available ER MD   Physician(s) quale and mcshane   Medication changes reported     No   Fall or balance concerns reported    No   Warm-up and Cool-down Performed on first and last piece of equipment   Resistance Training Performed No   VAD Patient? No   Pain Assessment   Currently in Pain? No/denies         Goals Met:  Proper associated with RPD/PD & O2 Sat Exercise tolerated well No report of cardiac concerns or symptoms Strength training completed today  Goals Unmet:  Not Applicable  Comments: York Cerise has been doing well with her exercise, feels the benefits of coming regularly.   Dr. Emily Filbert is Medical Director for [place] and [facility].

## 2015-08-24 ENCOUNTER — Inpatient Hospital Stay: Payer: Medicare Other | Admitting: Oncology

## 2015-08-28 ENCOUNTER — Emergency Department: Payer: Medicare Other

## 2015-08-28 ENCOUNTER — Emergency Department
Admission: EM | Admit: 2015-08-28 | Discharge: 2015-08-28 | Disposition: A | Discharge status: Home / Self Care | Payer: Medicare Other | Attending: Emergency Medicine | Admitting: Emergency Medicine

## 2015-08-28 ENCOUNTER — Encounter: Payer: Self-pay | Admitting: Radiology

## 2015-08-28 DIAGNOSIS — Z91040 Latex allergy status: Secondary | ICD-10-CM | POA: Diagnosis not present

## 2015-08-28 DIAGNOSIS — Z79899 Other long term (current) drug therapy: Secondary | ICD-10-CM | POA: Insufficient documentation

## 2015-08-28 DIAGNOSIS — Z7901 Long term (current) use of anticoagulants: Secondary | ICD-10-CM | POA: Insufficient documentation

## 2015-08-28 DIAGNOSIS — Y998 Other external cause status: Secondary | ICD-10-CM | POA: Diagnosis not present

## 2015-08-28 DIAGNOSIS — E119 Type 2 diabetes mellitus without complications: Secondary | ICD-10-CM | POA: Diagnosis not present

## 2015-08-28 DIAGNOSIS — W01198A Fall on same level from slipping, tripping and stumbling with subsequent striking against other object, initial encounter: Secondary | ICD-10-CM | POA: Insufficient documentation

## 2015-08-28 DIAGNOSIS — S0083XA Contusion of other part of head, initial encounter: Secondary | ICD-10-CM | POA: Insufficient documentation

## 2015-08-28 DIAGNOSIS — Y9289 Other specified places as the place of occurrence of the external cause: Secondary | ICD-10-CM | POA: Diagnosis not present

## 2015-08-28 DIAGNOSIS — I10 Essential (primary) hypertension: Secondary | ICD-10-CM | POA: Insufficient documentation

## 2015-08-28 DIAGNOSIS — Z7982 Long term (current) use of aspirin: Secondary | ICD-10-CM | POA: Insufficient documentation

## 2015-08-28 DIAGNOSIS — Y9389 Activity, other specified: Secondary | ICD-10-CM | POA: Insufficient documentation

## 2015-08-28 DIAGNOSIS — Z7984 Long term (current) use of oral hypoglycemic drugs: Secondary | ICD-10-CM | POA: Diagnosis not present

## 2015-08-28 DIAGNOSIS — S0990XA Unspecified injury of head, initial encounter: Secondary | ICD-10-CM

## 2015-08-28 NOTE — Discharge Instructions (Signed)
Concussion, Adult °A concussion, or closed-head injury, is a brain injury caused by a direct blow to the head or by a quick and sudden movement (jolt) of the head or neck. Concussions are usually not life-threatening. Even so, the effects of a concussion can be serious. If you have had a concussion before, you are more likely to experience concussion-like symptoms after a direct blow to the head.  °CAUSES °· Direct blow to the head, such as from running into another player during a soccer game, being hit in a fight, or hitting your head on a hard surface. °· A jolt of the head or neck that causes the brain to move back and forth inside the skull, such as in a car crash. °SIGNS AND SYMPTOMS °The signs of a concussion can be hard to notice. Early on, they may be missed by you, family members, and health care providers. You may look fine but act or feel differently. °Symptoms are usually temporary, but they may last for days, weeks, or even longer. Some symptoms may appear right away while others may not show up for hours or days. Every head injury is different. Symptoms include: °· Mild to moderate headaches that will not go away. °· A feeling of pressure inside your head. °· Having more trouble than usual: °¨ Learning or remembering things you have heard. °¨ Answering questions. °¨ Paying attention or concentrating. °¨ Organizing daily tasks. °¨ Making decisions and solving problems. °· Slowness in thinking, acting or reacting, speaking, or reading. °· Getting lost or being easily confused. °· Feeling tired all the time or lacking energy (fatigued). °· Feeling drowsy. °· Sleep disturbances. °¨ Sleeping more than usual. °¨ Sleeping less than usual. °¨ Trouble falling asleep. °¨ Trouble sleeping (insomnia). °· Loss of balance or feeling lightheaded or dizzy. °· Nausea or vomiting. °· Numbness or tingling. °· Increased sensitivity to: °¨ Sounds. °¨ Lights. °¨ Distractions. °· Vision problems or eyes that tire  easily. °· Diminished sense of taste or smell. °· Ringing in the ears. °· Mood changes such as feeling sad or anxious. °· Becoming easily irritated or angry for little or no reason. °· Lack of motivation. °· Seeing or hearing things other people do not see or hear (hallucinations). °DIAGNOSIS °Your health care provider can usually diagnose a concussion based on a description of your injury and symptoms. He or she will ask whether you passed out (lost consciousness) and whether you are having trouble remembering events that happened right before and during your injury. °Your evaluation might include: °· A brain scan to look for signs of injury to the brain. Even if the test shows no injury, you may still have a concussion. °· Blood tests to be sure other problems are not present. °TREATMENT °· Concussions are usually treated in an emergency department, in urgent care, or at a clinic. You may need to stay in the hospital overnight for further treatment. °· Tell your health care provider if you are taking any medicines, including prescription medicines, over-the-counter medicines, and natural remedies. Some medicines, such as blood thinners (anticoagulants) and aspirin, may increase the chance of complications. Also tell your health care provider whether you have had alcohol or are taking illegal drugs. This information may affect treatment. °· Your health care provider will send you home with important instructions to follow. °· How fast you will recover from a concussion depends on many factors. These factors include how severe your concussion is, what part of your brain was injured,   your age, and how healthy you were before the concussion. °· Most people with mild injuries recover fully. Recovery can take time. In general, recovery is slower in older persons. Also, persons who have had a concussion in the past or have other medical problems may find that it takes longer to recover from their current injury. °HOME  CARE INSTRUCTIONS °General Instructions °· Carefully follow the directions your health care provider gave you. °· Only take over-the-counter or prescription medicines for pain, discomfort, or fever as directed by your health care provider. °· Take only those medicines that your health care provider has approved. °· Do not drink alcohol until your health care provider says you are well enough to do so. Alcohol and certain other drugs may slow your recovery and can put you at risk of further injury. °· If it is harder than usual to remember things, write them down. °· If you are easily distracted, try to do one thing at a time. For example, do not try to watch TV while fixing dinner. °· Talk with family members or close friends when making important decisions. °· Keep all follow-up appointments. Repeated evaluation of your symptoms is recommended for your recovery. °· Watch your symptoms and tell others to do the same. Complications sometimes occur after a concussion. Older adults with a brain injury may have a higher risk of serious complications, such as a blood clot on the brain. °· Tell your teachers, school nurse, school counselor, coach, athletic trainer, or work manager about your injury, symptoms, and restrictions. Tell them about what you can or cannot do. They should watch for: °¨ Increased problems with attention or concentration. °¨ Increased difficulty remembering or learning new information. °¨ Increased time needed to complete tasks or assignments. °¨ Increased irritability or decreased ability to cope with stress. °¨ Increased symptoms. °· Rest. Rest helps the brain to heal. Make sure you: °¨ Get plenty of sleep at night. Avoid staying up late at night. °¨ Keep the same bedtime hours on weekends and weekdays. °¨ Rest during the day. Take daytime naps or rest breaks when you feel tired. °· Limit activities that require a lot of thought or concentration. These include: °¨ Doing homework or job-related  work. °¨ Watching TV. °¨ Working on the computer. °· Avoid any situation where there is potential for another head injury (football, hockey, soccer, basketball, martial arts, downhill snow sports and horseback riding). Your condition will get worse every time you experience a concussion. You should avoid these activities until you are evaluated by the appropriate follow-up health care providers. °Returning To Your Regular Activities °You will need to return to your normal activities slowly, not all at once. You must give your body and brain enough time for recovery. °· Do not return to sports or other athletic activities until your health care provider tells you it is safe to do so. °· Ask your health care provider when you can drive, ride a bicycle, or operate heavy machinery. Your ability to react may be slower after a brain injury. Never do these activities if you are dizzy. °· Ask your health care provider about when you can return to work or school. °Preventing Another Concussion °It is very important to avoid another brain injury, especially before you have recovered. In rare cases, another injury can lead to permanent brain damage, brain swelling, or death. The risk of this is greatest during the first 7-10 days after a head injury. Avoid injuries by: °· Wearing a   seat belt when riding in a car. °· Drinking alcohol only in moderation. °· Wearing a helmet when biking, skiing, skateboarding, skating, or doing similar activities. °· Avoiding activities that could lead to a second concussion, such as contact or recreational sports, until your health care provider says it is okay. °· Taking safety measures in your home. °¨ Remove clutter and tripping hazards from floors and stairways. °¨ Use grab bars in bathrooms and handrails by stairs. °¨ Place non-slip mats on floors and in bathtubs. °¨ Improve lighting in dim areas. °SEEK MEDICAL CARE IF: °· You have increased problems paying attention or  concentrating. °· You have increased difficulty remembering or learning new information. °· You need more time to complete tasks or assignments than before. °· You have increased irritability or decreased ability to cope with stress. °· You have more symptoms than before. °Seek medical care if you have any of the following symptoms for more than 2 weeks after your injury: °· Lasting (chronic) headaches. °· Dizziness or balance problems. °· Nausea. °· Vision problems. °· Increased sensitivity to noise or light. °· Depression or mood swings. °· Anxiety or irritability. °· Memory problems. °· Difficulty concentrating or paying attention. °· Sleep problems. °· Feeling tired all the time. °SEEK IMMEDIATE MEDICAL CARE IF: °· You have severe or worsening headaches. These may be a sign of a blood clot in the brain. °· You have weakness (even if only in one hand, leg, or part of the face). °· You have numbness. °· You have decreased coordination. °· You vomit repeatedly. °· You have increased sleepiness. °· One pupil is larger than the other. °· You have convulsions. °· You have slurred speech. °· You have increased confusion. This may be a sign of a blood clot in the brain. °· You have increased restlessness, agitation, or irritability. °· You are unable to recognize people or places. °· You have neck pain. °· It is difficult to wake you up. °· You have unusual behavior changes. °· You lose consciousness. °MAKE SURE YOU: °· Understand these instructions. °· Will watch your condition. °· Will get help right away if you are not doing well or get worse. °  °This information is not intended to replace advice given to you by your health care provider. Make sure you discuss any questions you have with your health care provider. °  °Document Released: 08/31/2003 Document Revised: 07/01/2014 Document Reviewed: 12/31/2012 °Elsevier Interactive Patient Education ©2016 [employer] ° °Please return immediately if condition worsens.  Please contact her primary physician or the physician you were given for referral. If you have any specialist physicians involved in her treatment and plan please also contact them. Thank you for using [facility]. ° °

## 2015-08-28 NOTE — ED Notes (Signed)
Pt states she was putting on her sweater and lost her balance and fell back hitting her head on the bed post.. Denies LOC, denies any other injury./. Pt has swelling noted and is on eliquist

## 2015-08-29 ENCOUNTER — Inpatient Hospital Stay: Payer: Medicare Other | Attending: Oncology | Admitting: Oncology

## 2015-08-29 VITALS — BP 107/66 | HR 75 | Temp 96.4°F | Resp 16 | Wt 175.9 lb

## 2015-08-29 DIAGNOSIS — S0003XA Contusion of scalp, initial encounter: Secondary | ICD-10-CM | POA: Insufficient documentation

## 2015-08-29 DIAGNOSIS — W010XXA Fall on same level from slipping, tripping and stumbling without subsequent striking against object, initial encounter: Secondary | ICD-10-CM | POA: Insufficient documentation

## 2015-08-29 DIAGNOSIS — I509 Heart failure, unspecified: Secondary | ICD-10-CM | POA: Insufficient documentation

## 2015-08-29 DIAGNOSIS — I517 Cardiomegaly: Secondary | ICD-10-CM | POA: Insufficient documentation

## 2015-08-29 DIAGNOSIS — I10 Essential (primary) hypertension: Secondary | ICD-10-CM | POA: Insufficient documentation

## 2015-08-29 DIAGNOSIS — R590 Localized enlarged lymph nodes: Secondary | ICD-10-CM | POA: Diagnosis not present

## 2015-08-29 DIAGNOSIS — R911 Solitary pulmonary nodule: Secondary | ICD-10-CM | POA: Diagnosis not present

## 2015-08-29 DIAGNOSIS — E119 Type 2 diabetes mellitus without complications: Secondary | ICD-10-CM | POA: Insufficient documentation

## 2015-08-29 DIAGNOSIS — I2510 Atherosclerotic heart disease of native coronary artery without angina pectoris: Secondary | ICD-10-CM | POA: Insufficient documentation

## 2015-08-29 DIAGNOSIS — Z79899 Other long term (current) drug therapy: Secondary | ICD-10-CM | POA: Insufficient documentation

## 2015-08-29 NOTE — ED Provider Notes (Signed)
Time Seen: Approximately *1947  I have reviewed the triage notes  Chief Complaint: Head Injury   History of Present Illness: [name] is a 82 y.o. female who had a non-syncopal fall today. Patient states she lost her balance while she was playing on a sweater and hit the posterior surface of her head on a bedpost. She denies any loss of consciousness. Patient is on blood thinner therapy. She has a history of chronic atrial fibrillation. She denies any focal visual deficits present states that she has some accommodation issues which still remained. The patient's daughter states that when she first arrived here to the emergency department that she seemed "" out of it "". She seems to be more awake and alert at this time. Patient was able to give a normal history and review of systems. She is on chronic medication for diabetes, hypertension, etc. She states she's not noticed any difficulties with her blood sugar and had no associated loss of consciousness either before or after her fall. Denies any significant neck pain, thoracic, lumbar spine pain. Patient has been able to ambulate normally.   Past Medical History  Diagnosis Date  . CHF (congestive heart failure) ([place])   . Diabetes mellitus without complication ([place])   . Hypertension   . Chronic back pain   . Cancer [facility])     skin ca    Patient Active Problem List   Diagnosis Date Noted  . Chronic diastolic heart failure ([place]) 04/10/2015  . Atrial fibrillation ([place]) 04/10/2015  . Essential hypertension 04/10/2015  . Diabetes ([place]) 04/10/2015  . Back pain 04/10/2015  . Acute on chronic systolic CHF (congestive heart failure) ([place]) 03/21/2015  . Pulmonary nodule 11/10/2014    Past Surgical History  Procedure Laterality Date  . Pacemaker placement    . Leg surgery    . Abdominal hysterectomy    . Tonsillectomy    . Back surgery      x3  . Coronary stent placement      unknown location per pt  . Foot surgery       Past Surgical History  Procedure Laterality Date  . Pacemaker placement    . Leg surgery    . Abdominal hysterectomy    . Tonsillectomy    . Back surgery      x3  . Coronary stent placement      unknown location per pt  . Foot surgery      Current Outpatient Rx  Name  Route  Sig  Dispense  Refill  . apixaban (ELIQUIS) 5 MG TABS tablet   Oral   Take 5 mg by mouth 2 (two) times daily.          Marland Kitchen aspirin 81 MG tablet   Oral   Take 81 mg by mouth daily.         . baclofen (LIORESAL) 10 MG tablet   Oral   Take 10 mg by mouth 2 (two) times daily.          . carvedilol (COREG) 6.25 MG tablet   Oral   Take 6.25 mg by mouth 2 (two) times daily with a meal.          . docusate sodium (COLACE) 100 MG capsule   Oral   Take 100 mg by mouth daily.         Marland Kitchen escitalopram (LEXAPRO) 10 MG tablet   Oral   Take 10 mg by mouth daily.         Marland Kitchen  ferrous sulfate 325 (65 FE) MG tablet   Oral   Take 325 mg by mouth daily with breakfast.         . furosemide (LASIX) 80 MG tablet   Oral   Take 80 mg by mouth every other day. 80mg  every other day and 40mg  every other day         . glimepiride (AMARYL) 4 MG tablet   Oral   Take 4 mg by mouth 2 (two) times daily after a meal.         . JANUVIA 100 MG tablet   Oral   Take 100 mg by mouth daily.           Dispense as written.   Marland Kitchen losartan (COZAAR) 25 MG tablet   Oral   Take 25 mg by mouth daily.         . Magnesium Oxide -Mg Supplement 400 MG CAPS   Oral   Take 400 mg by mouth daily.         . Melatonin 1 MG CAPS   Oral   Take 1 mg by mouth at bedtime.          . metolazone (ZAROXOLYN) 2.5 MG tablet   Oral   Take 2.5 mg by mouth 2 (two) times a week. Takes on Monday and Friday         . oxycodone (OXY-IR) 5 MG capsule   Oral   Take 5-10 mg by mouth See admin instructions. 5mg  in the morning and mid-afternoon and 10mg  at bedtime         . pantoprazole (PROTONIX) 40 MG tablet   Oral    Take 40 mg by mouth daily.         Marland Kitchen rOPINIRole (REQUIP) 0.5 MG tablet   Oral   Take 0.5 mg by mouth 2 (two) times daily.         Marland Kitchen spironolactone (ALDACTONE) 25 MG tablet   Oral   Take 1 tablet by mouth daily.         . vitamin B-12 (CYANOCOBALAMIN) 100 MCG tablet   Oral   Take 100 mcg by mouth daily.         . vitamin C (ASCORBIC ACID) 500 MG tablet   Oral   Take 500 mg by mouth daily.         . Vitamin D, Ergocalciferol, (DRISDOL) 50000 UNITS CAPS capsule   Oral   Take 50,000 Units by mouth every 7 (seven) days.            Allergies:  Ceftriaxone; Cefuroxime; Cephalosporins; Codeine; Epinephrine; Morphine and related; Rocephin; Buprenorphine hcl; Ciprofloxacin; Latex; Lidocaine; and Procaine  Family History: Family History  Problem Relation Age of Onset  . CVA Mother   . Diabetes Mellitus II Sister   . Heart disease Father     Social History: Social History  Substance Use Topics  . Smoking status: Never Smoker   . Smokeless tobacco: Never Used  . Alcohol Use: No     Review of Systems:   10 point review of systems was performed and was otherwise negative:  Constitutional: No fever Eyes: Accommodation disturbance  ENT: No sore throat, ear pain Cardiac: No chest pain Respiratory: No shortness of breath, wheezing, or stridor Abdomen: No abdominal pain, no vomiting, No diarrhea Endocrine: No weight loss, No night sweats Extremities: No peripheral edema, cyanosis Skin: No rashes, easy bruising Neurologic: No focal weakness, trouble with speech or swollowing Urologic: No dysuria,  Hematuria, or urinary frequency   Physical Exam:  ED Triage Vitals  Enc Vitals Group     BP 08/28/15 1752 136/53 mmHg     Pulse Rate 08/28/15 1752 70     Resp 08/28/15 1752 20     Temp 08/28/15 1752 98.2 F (36.8 C)     Temp Source 08/28/15 1752 Oral     SpO2 08/28/15 1752 95 %     Weight 08/28/15 1752 175 lb (79.379 kg)     Height 08/28/15 1752 5\' 7"  (1.702 m)      Head Cir --      Peak Flow --      Pain Score 08/28/15 2005 0     Pain Loc --      Pain Edu? --      Excl. in [place]? --     General: Awake , Alert , and Oriented times 3; GCS 15 Head: Normal cephalic , patient does have a palpable hematoma left occipital area No crepitus or step-off noted Eyes: Pupils equal , round, reactive to light Nose/Throat: No nasal drainage, patent upper airway without erythema or exudate.  Neck: Supple, Full range of motion, No anterior adenopathy or palpable thyroid masses Lungs: Clear to ascultation without wheezes , rhonchi, or rales Heart: Regular rate, regular rhythm without murmurs , gallops , or rubs Abdomen: Soft, non tender without rebound, guarding , or rigidity; bowel sounds positive and symmetric in all 4 quadrants. No organomegaly .        Extremities: 2 plus symmetric pulses. No edema, clubbing or cyanosis Neurologic: normal ambulation, Motor symmetric without deficits, sensory intact Skin: warm, dry, no rashes    Radiology:    EXAM: CT HEAD WITHOUT CONTRAST  TECHNIQUE: Contiguous axial images were obtained from the base of the skull through the vertex without intravenous contrast.  COMPARISON: Brain CT 04/19/2013.  FINDINGS: Ventricles and sulci are appropriate for patient's age. Periventricular and subcortical white matter hypodensity compatible with chronic microvascular ischemic changes. No evidence for acute cortically based infarct, intracranial hemorrhage, mass lesion or mass effect. Orbits are unremarkable. Soft tissue swelling and hematoma formation overlying the left posterior calvarium.  IMPRESSION: No acute intracranial process.  Soft tissue swelling about the left posterior calvarium without evidence for displaced underlying skull fracture.   Electronically Signed By: Lovey Newcomer M.D.  I personally reviewed the radiologic studies   ED Course:  Patient's stay here was uneventful and she seemed to have  gradual symptomatic improvement based on her history during her stay here in emergency department. She does not appear to have a subdural, epidural, or cerebral contusion. She most likely suffered at least a moderate concussion. She does not have any associated loss of consciousness. Family and the patient were given head injury instructions and advised to return here especially if she has increasing headache, weakness, persistent visual disturbances, or any other new concerns.*  Patient was given concussion instructions  Assessment:  Acute closed head injury   Final Clinical Impression: *  Final diagnoses:  Head injury, initial encounter     Plan: * Outpatient management Patient was advised to return immediately if condition worsens. Patient was advised to follow up with their primary care physician or other specialized physicians involved in their outpatient care           Daymon Larsen, MD 08/29/15 0010

## 2015-08-29 NOTE — Progress Notes (Signed)
Patient fell yesterday and went to ER where she was diagnosed with a concussion.

## 2015-08-30 ENCOUNTER — Encounter: Payer: Medicare Other | Admitting: Respiratory Therapy

## 2015-09-01 ENCOUNTER — Inpatient Hospital Stay
Admission: EM | Admit: 2015-09-01 | Discharge: 2015-09-03 | DRG: 193 | Disposition: A | Discharge status: Home Health | Payer: Medicare Other | Attending: Internal Medicine | Admitting: Internal Medicine

## 2015-09-01 ENCOUNTER — Emergency Department: Payer: Medicare Other

## 2015-09-01 ENCOUNTER — Other Ambulatory Visit: Payer: Self-pay

## 2015-09-01 ENCOUNTER — Encounter: Payer: Self-pay | Admitting: *Deleted

## 2015-09-01 DIAGNOSIS — Z7984 Long term (current) use of oral hypoglycemic drugs: Secondary | ICD-10-CM | POA: Diagnosis not present

## 2015-09-01 DIAGNOSIS — R531 Weakness: Secondary | ICD-10-CM

## 2015-09-01 DIAGNOSIS — G8929 Other chronic pain: Secondary | ICD-10-CM | POA: Diagnosis present

## 2015-09-01 DIAGNOSIS — J9601 Acute respiratory failure with hypoxia: Secondary | ICD-10-CM | POA: Diagnosis present

## 2015-09-01 DIAGNOSIS — Z823 Family history of stroke: Secondary | ICD-10-CM | POA: Diagnosis not present

## 2015-09-01 DIAGNOSIS — E119 Type 2 diabetes mellitus without complications: Secondary | ICD-10-CM | POA: Diagnosis present

## 2015-09-01 DIAGNOSIS — Z85828 Personal history of other malignant neoplasm of skin: Secondary | ICD-10-CM | POA: Diagnosis not present

## 2015-09-01 DIAGNOSIS — W19XXXA Unspecified fall, initial encounter: Secondary | ICD-10-CM | POA: Diagnosis present

## 2015-09-01 DIAGNOSIS — I5033 Acute on chronic diastolic (congestive) heart failure: Secondary | ICD-10-CM | POA: Diagnosis present

## 2015-09-01 DIAGNOSIS — R51 Headache: Secondary | ICD-10-CM | POA: Diagnosis present

## 2015-09-01 DIAGNOSIS — Z7982 Long term (current) use of aspirin: Secondary | ICD-10-CM | POA: Diagnosis not present

## 2015-09-01 DIAGNOSIS — Z955 Presence of coronary angioplasty implant and graft: Secondary | ICD-10-CM

## 2015-09-01 DIAGNOSIS — B9620 Unspecified Escherichia coli [E. coli] as the cause of diseases classified elsewhere: Secondary | ICD-10-CM | POA: Diagnosis present

## 2015-09-01 DIAGNOSIS — S0003XA Contusion of scalp, initial encounter: Secondary | ICD-10-CM | POA: Diagnosis present

## 2015-09-01 DIAGNOSIS — Z885 Allergy status to narcotic agent status: Secondary | ICD-10-CM

## 2015-09-01 DIAGNOSIS — Z950 Presence of cardiac pacemaker: Secondary | ICD-10-CM

## 2015-09-01 DIAGNOSIS — I482 Chronic atrial fibrillation: Secondary | ICD-10-CM | POA: Diagnosis present

## 2015-09-01 DIAGNOSIS — Z8249 Family history of ischemic heart disease and other diseases of the circulatory system: Secondary | ICD-10-CM | POA: Diagnosis not present

## 2015-09-01 DIAGNOSIS — R918 Other nonspecific abnormal finding of lung field: Secondary | ICD-10-CM | POA: Diagnosis present

## 2015-09-01 DIAGNOSIS — Z7901 Long term (current) use of anticoagulants: Secondary | ICD-10-CM

## 2015-09-01 DIAGNOSIS — Z9981 Dependence on supplemental oxygen: Secondary | ICD-10-CM | POA: Diagnosis not present

## 2015-09-01 DIAGNOSIS — J189 Pneumonia, unspecified organism: Principal | ICD-10-CM | POA: Diagnosis present

## 2015-09-01 DIAGNOSIS — I420 Dilated cardiomyopathy: Secondary | ICD-10-CM | POA: Diagnosis present

## 2015-09-01 DIAGNOSIS — J9600 Acute respiratory failure, unspecified whether with hypoxia or hypercapnia: Secondary | ICD-10-CM | POA: Diagnosis present

## 2015-09-01 DIAGNOSIS — Z91040 Latex allergy status: Secondary | ICD-10-CM | POA: Diagnosis not present

## 2015-09-01 DIAGNOSIS — N3090 Cystitis, unspecified without hematuria: Secondary | ICD-10-CM | POA: Diagnosis present

## 2015-09-01 DIAGNOSIS — Z833 Family history of diabetes mellitus: Secondary | ICD-10-CM | POA: Diagnosis not present

## 2015-09-01 DIAGNOSIS — N390 Urinary tract infection, site not specified: Secondary | ICD-10-CM

## 2015-09-01 DIAGNOSIS — I110 Hypertensive heart disease with heart failure: Secondary | ICD-10-CM | POA: Diagnosis present

## 2015-09-01 DIAGNOSIS — Z888 Allergy status to other drugs, medicaments and biological substances status: Secondary | ICD-10-CM | POA: Diagnosis not present

## 2015-09-01 DIAGNOSIS — M545 Low back pain: Secondary | ICD-10-CM | POA: Diagnosis present

## 2015-09-01 DIAGNOSIS — I509 Heart failure, unspecified: Secondary | ICD-10-CM

## 2015-09-01 DIAGNOSIS — F329 Major depressive disorder, single episode, unspecified: Secondary | ICD-10-CM | POA: Diagnosis present

## 2015-09-01 DIAGNOSIS — R0902 Hypoxemia: Secondary | ICD-10-CM

## 2015-09-01 LAB — CBC WITH DIFFERENTIAL/PLATELET
Basophils Absolute: 0.1 10*3/uL (ref 0–0.1)
Basophils Relative: 1 %
Eosinophils Absolute: 0 10*3/uL (ref 0–0.7)
Eosinophils Relative: 0 %
HEMATOCRIT: 29.2 % — AB (ref 35.0–47.0)
HEMOGLOBIN: 9.4 g/dL — AB (ref 12.0–16.0)
LYMPHS ABS: 0.4 10*3/uL — AB (ref 1.0–3.6)
Lymphocytes Relative: 4 %
MCH: 29.2 pg (ref 26.0–34.0)
MCHC: 32.2 g/dL (ref 32.0–36.0)
MCV: 90.6 fL (ref 80.0–100.0)
MONOS PCT: 6 %
Monocytes Absolute: 0.8 10*3/uL (ref 0.2–0.9)
NEUTROS ABS: 11.3 10*3/uL — AB (ref 1.4–6.5)
NEUTROS PCT: 89 %
Platelets: 160 10*3/uL (ref 150–440)
RBC: 3.23 MIL/uL — ABNORMAL LOW (ref 3.80–5.20)
RDW: 15 % — ABNORMAL HIGH (ref 11.5–14.5)
WBC: 12.6 10*3/uL — ABNORMAL HIGH (ref 3.6–11.0)

## 2015-09-01 LAB — COMPREHENSIVE METABOLIC PANEL
ALBUMIN: 3.2 g/dL — AB (ref 3.5–5.0)
ALK PHOS: 68 U/L (ref 38–126)
ALT: 11 U/L — ABNORMAL LOW (ref 14–54)
ANION GAP: 5 (ref 5–15)
AST: 16 U/L (ref 15–41)
BUN: 35 mg/dL — AB (ref 6–20)
CALCIUM: 8.4 mg/dL — AB (ref 8.9–10.3)
CO2: 30 mmol/L (ref 22–32)
CREATININE: 1.07 mg/dL — AB (ref 0.44–1.00)
Chloride: 101 mmol/L (ref 101–111)
GFR, EST AFRICAN AMERICAN: 52 mL/min — AB (ref >60)
GFR, EST NON AFRICAN AMERICAN: 45 mL/min — AB (ref >60)
GLUCOSE: 225 mg/dL — AB (ref 65–99)
Potassium: 4.3 mmol/L (ref 3.5–5.1)
Sodium: 136 mmol/L (ref 135–145)
TOTAL PROTEIN: 6.4 g/dL — AB (ref 6.5–8.1)
Total Bilirubin: 1 mg/dL (ref 0.3–1.2)

## 2015-09-01 LAB — URINALYSIS COMPLETE WITH MICROSCOPIC (ARMC ONLY)
Bilirubin Urine: NEGATIVE
GLUCOSE, UA: NEGATIVE mg/dL
Ketones, ur: NEGATIVE mg/dL
Nitrite: POSITIVE — AB
Protein, ur: 30 mg/dL — AB
Specific Gravity, Urine: 1.018 (ref 1.005–1.030)
pH: 7 (ref 5.0–8.0)

## 2015-09-01 LAB — GLUCOSE, CAPILLARY
Glucose-Capillary: 121 mg/dL — ABNORMAL HIGH (ref 65–99)
Glucose-Capillary: 150 mg/dL — ABNORMAL HIGH (ref 65–99)

## 2015-09-01 LAB — PROTIME-INR
INR: 2.11
PROTHROMBIN TIME: 23.5 s — AB (ref 11.4–15.0)

## 2015-09-01 LAB — TROPONIN I: Troponin I: <0.03 ng/mL (ref <0.031)

## 2015-09-01 MED ORDER — GLIMEPIRIDE 2 MG PO TABS
4.0000 mg | ORAL_TABLET | Freq: Two times a day (BID) | ORAL | Status: DC
  Administered 2015-09-01 – 2015-09-03 (×4): 4 mg via ORAL
  Filled 2015-09-01 (×4): qty 2

## 2015-09-01 MED ORDER — APIXABAN 5 MG PO TABS
5.0000 mg | ORAL_TABLET | Freq: Two times a day (BID) | ORAL | Status: DC
  Administered 2015-09-01 – 2015-09-03 (×4): 5 mg via ORAL
  Filled 2015-09-01 (×5): qty 1

## 2015-09-01 MED ORDER — LINAGLIPTIN 5 MG PO TABS
5.0000 mg | ORAL_TABLET | Freq: Every day | ORAL | Status: DC
  Administered 2015-09-02 – 2015-09-03 (×2): 5 mg via ORAL
  Filled 2015-09-01 (×2): qty 1

## 2015-09-01 MED ORDER — ESCITALOPRAM OXALATE 10 MG PO TABS
10.0000 mg | ORAL_TABLET | Freq: Every day | ORAL | Status: DC
  Administered 2015-09-02 – 2015-09-03 (×2): 10 mg via ORAL
  Filled 2015-09-01 (×2): qty 1

## 2015-09-01 MED ORDER — FERROUS SULFATE 325 (65 FE) MG PO TABS
325.0000 mg | ORAL_TABLET | Freq: Every day | ORAL | Status: DC
  Administered 2015-09-02 – 2015-09-03 (×2): 325 mg via ORAL
  Filled 2015-09-01 (×2): qty 1

## 2015-09-01 MED ORDER — IPRATROPIUM-ALBUTEROL 0.5-2.5 (3) MG/3ML IN SOLN
3.0000 mL | RESPIRATORY_TRACT | Status: DC | PRN
  Administered 2015-09-02: 3 mL via RESPIRATORY_TRACT
  Filled 2015-09-01: qty 3

## 2015-09-01 MED ORDER — CIPROFLOXACIN IN D5W 400 MG/200ML IV SOLN
400.0000 mg | Freq: Once | INTRAVENOUS | Status: AC
  Administered 2015-09-01: 400 mg via INTRAVENOUS
  Filled 2015-09-01: qty 200

## 2015-09-01 MED ORDER — LEVOFLOXACIN 500 MG PO TABS
250.0000 mg | ORAL_TABLET | Freq: Every day | ORAL | Status: DC
  Administered 2015-09-02 – 2015-09-03 (×2): 250 mg via ORAL
  Filled 2015-09-01 (×2): qty 1

## 2015-09-01 MED ORDER — ROPINIROLE HCL 1 MG PO TABS
0.5000 mg | ORAL_TABLET | Freq: Two times a day (BID) | ORAL | Status: DC
  Administered 2015-09-01: 1 mg via ORAL
  Administered 2015-09-02 – 2015-09-03 (×3): 0.5 mg via ORAL
  Filled 2015-09-01 (×6): qty 1

## 2015-09-01 MED ORDER — VITAMIN D (ERGOCALCIFEROL) 1.25 MG (50000 UNIT) PO CAPS
50000.0000 [IU] | ORAL_CAPSULE | ORAL | Status: DC
Start: 2015-09-07 — End: 2015-09-03

## 2015-09-01 MED ORDER — SPIRONOLACTONE 25 MG PO TABS
25.0000 mg | ORAL_TABLET | Freq: Every day | ORAL | Status: DC
  Administered 2015-09-02 – 2015-09-03 (×2): 25 mg via ORAL
  Filled 2015-09-01 (×2): qty 1

## 2015-09-01 MED ORDER — CARVEDILOL 6.25 MG PO TABS
6.2500 mg | ORAL_TABLET | Freq: Two times a day (BID) | ORAL | Status: DC
  Administered 2015-09-01 – 2015-09-03 (×4): 6.25 mg via ORAL
  Filled 2015-09-01 (×4): qty 1

## 2015-09-01 MED ORDER — PANTOPRAZOLE SODIUM 40 MG PO TBEC
40.0000 mg | DELAYED_RELEASE_TABLET | Freq: Every day | ORAL | Status: DC
  Administered 2015-09-02 – 2015-09-03 (×2): 40 mg via ORAL
  Filled 2015-09-01 (×2): qty 1

## 2015-09-01 MED ORDER — IPRATROPIUM-ALBUTEROL 0.5-2.5 (3) MG/3ML IN SOLN
3.0000 mL | Freq: Once | RESPIRATORY_TRACT | Status: AC
  Administered 2015-09-01: 3 mL via RESPIRATORY_TRACT
  Filled 2015-09-01: qty 3

## 2015-09-01 MED ORDER — MELATONIN 1 MG PO TABS: 1.0000 mg | ORAL_TABLET | Freq: Every day | ORAL | Status: DC

## 2015-09-01 MED ORDER — LOSARTAN POTASSIUM 25 MG PO TABS
25.0000 mg | ORAL_TABLET | Freq: Every day | ORAL | Status: DC
  Administered 2015-09-02 – 2015-09-03 (×2): 25 mg via ORAL
  Filled 2015-09-01 (×2): qty 1

## 2015-09-01 MED ORDER — VITAMIN B-12 1000 MCG PO TABS
1000.0000 ug | ORAL_TABLET | Freq: Every day | ORAL | Status: DC
Start: 2015-09-02 — End: 2015-09-03
  Administered 2015-09-02 – 2015-09-03 (×2): 1000 ug via ORAL
  Filled 2015-09-01 (×2): qty 1

## 2015-09-01 MED ORDER — FUROSEMIDE 10 MG/ML IJ SOLN
40.0000 mg | Freq: Two times a day (BID) | INTRAMUSCULAR | Status: DC
  Administered 2015-09-02 – 2015-09-03 (×3): 40 mg via INTRAVENOUS
  Filled 2015-09-01 (×4): qty 4

## 2015-09-01 MED ORDER — CLOTRIMAZOLE 10 MG MT TROC
10.0000 mg | Freq: Every day | OROMUCOSAL | Status: DC
  Administered 2015-09-01 – 2015-09-03 (×9): 10 mg via ORAL
  Filled 2015-09-01 (×13): qty 1

## 2015-09-01 MED ORDER — METOLAZONE 2.5 MG PO TABS
2.5000 mg | ORAL_TABLET | ORAL | Status: DC
  Filled 2015-09-01: qty 1

## 2015-09-01 MED ORDER — DOCUSATE SODIUM 100 MG PO CAPS
100.0000 mg | ORAL_CAPSULE | Freq: Two times a day (BID) | ORAL | Status: DC
  Administered 2015-09-01 – 2015-09-03 (×4): 100 mg via ORAL
  Filled 2015-09-01 (×4): qty 1

## 2015-09-01 MED ORDER — INSULIN ASPART 100 UNIT/ML ~~LOC~~ SOLN
0.0000 [IU] | Freq: Three times a day (TID) | SUBCUTANEOUS | Status: DC
  Administered 2015-09-02: 2 [IU] via SUBCUTANEOUS
  Administered 2015-09-02: 3 [IU] via SUBCUTANEOUS
  Administered 2015-09-03: 2 [IU] via SUBCUTANEOUS
  Filled 2015-09-01 (×3): qty 2

## 2015-09-01 MED ORDER — VITAMIN C 500 MG PO TABS
500.0000 mg | ORAL_TABLET | Freq: Every day | ORAL | Status: DC
  Administered 2015-09-02 – 2015-09-03 (×2): 500 mg via ORAL
  Filled 2015-09-01 (×2): qty 1

## 2015-09-01 MED ORDER — VITAMIN D 1000 UNITS PO TABS
1000.0000 [IU] | ORAL_TABLET | Freq: Every day | ORAL | Status: DC
Start: 2015-09-02 — End: 2015-09-03
  Administered 2015-09-02 – 2015-09-03 (×2): 1000 [IU] via ORAL
  Filled 2015-09-01 (×2): qty 1

## 2015-09-01 MED ORDER — ASPIRIN EC 81 MG PO TBEC
81.0000 mg | DELAYED_RELEASE_TABLET | Freq: Every day | ORAL | Status: DC
  Administered 2015-09-01 – 2015-09-02 (×3): 81 mg via ORAL
  Filled 2015-09-01 (×3): qty 1

## 2015-09-01 MED ORDER — OXYCODONE HCL 5 MG PO TABS
5.0000 mg | ORAL_TABLET | ORAL | Status: DC | PRN
  Administered 2015-09-01 (×2): 5 mg via ORAL
  Administered 2015-09-02: 10 mg via ORAL
  Administered 2015-09-02: 5 mg via ORAL
  Filled 2015-09-01: qty 1
  Filled 2015-09-01: qty 2
  Filled 2015-09-01 (×2): qty 1

## 2015-09-01 MED ORDER — SODIUM CHLORIDE 0.9 % IV SOLN
1000.0000 mL | Freq: Once | INTRAVENOUS | Status: AC
  Administered 2015-09-01: 1000 mL via INTRAVENOUS

## 2015-09-01 MED ORDER — LEVOFLOXACIN IN D5W 250 MG/50ML IV SOLN: 250.0000 mg | INTRAVENOUS | Status: DC

## 2015-09-01 MED ORDER — RAMELTEON 8 MG PO TABS
8.0000 mg | ORAL_TABLET | Freq: Every day | ORAL | Status: DC
  Administered 2015-09-01 – 2015-09-02 (×2): 8 mg via ORAL
  Filled 2015-09-01 (×3): qty 1

## 2015-09-01 MED ORDER — MAGNESIUM OXIDE 400 (241.3 MG) MG PO TABS
400.0000 mg | ORAL_TABLET | Freq: Every day | ORAL | Status: DC
  Administered 2015-09-02 – 2015-09-03 (×2): 400 mg via ORAL
  Filled 2015-09-01 (×2): qty 1

## 2015-09-01 MED ORDER — DIPHENHYDRAMINE HCL 50 MG/ML IJ SOLN
12.5000 mg | Freq: Once | INTRAMUSCULAR | Status: AC
  Administered 2015-09-01: 12.5 mg via INTRAVENOUS
  Filled 2015-09-01: qty 1

## 2015-09-01 NOTE — ED Notes (Signed)
Patient O2 dropping between 78% and 89% on room air. Doctor Jimmye Norman notified of this trend. 2L of oxygen replaced at this time.

## 2015-09-01 NOTE — Progress Notes (Signed)

## 2015-09-01 NOTE — Discharge Instructions (Signed)
Weakness °Weakness is a lack of strength. It may be felt all over the body (generalized) or in one specific part of the body (focal). Some causes of weakness can be serious. You may need further medical evaluation, especially if you are elderly or you have a history of immunosuppression (such as chemotherapy or HIV), kidney disease, heart disease, or diabetes. °CAUSES  °Weakness can be caused by many different things, including: °· Infection. °· Physical exhaustion. °· Internal bleeding or other blood loss that results in a lack of red blood cells (anemia). °· Dehydration. This cause is more common in elderly people. °· Side effects or electrolyte abnormalities from medicines, such as pain medicines or sedatives. °· Emotional distress, anxiety, or depression. °· Circulation problems, especially severe peripheral arterial disease. °· Heart disease, such as rapid atrial fibrillation, bradycardia, or heart failure. °· Nervous system disorders, such as Guillain-Barré syndrome, multiple sclerosis, or stroke. °DIAGNOSIS  °To find the cause of your weakness, your caregiver will take your history and perform a physical exam. Lab tests or X-rays may also be ordered, if needed. °TREATMENT  °Treatment of weakness depends on the cause of your symptoms and can vary greatly. °HOME CARE INSTRUCTIONS  °· Rest as needed. °· Eat a well-balanced diet. °· Try to get some exercise every day. °· Only take over-the-counter or prescription medicines as directed by your caregiver. °SEEK MEDICAL CARE IF:  °· Your weakness seems to be getting worse or spreads to other parts of your body. °· You develop new aches or pains. °SEEK IMMEDIATE MEDICAL CARE IF:  °· You cannot perform your normal daily activities, such as getting dressed and feeding yourself. °· You cannot walk up and down stairs, or you feel exhausted when you do so. °· You have shortness of breath or chest pain. °· You have difficulty moving parts of your body. °· You have weakness  in only one area of the body or on only one side of the body. °· You have a fever. °· You have trouble speaking or swallowing. °· You cannot control your bladder or bowel movements. °· You have black or bloody vomit or stools. °MAKE SURE YOU: °· Understand these instructions. °· Will watch your condition. °· Will get help right away if you are not doing well or get worse. °  °This information is not intended to replace advice given to you by your health care provider. Make sure you discuss any questions you have with your health care provider. °  °Document Released: 06/10/2005 Document Revised: 12/10/2011 Document Reviewed: 08/09/2011 °Elsevier Interactive Patient Education ©2016 [employer] ° °Urinary Tract Infection °Urinary tract infections (UTIs) can develop anywhere along your urinary tract. Your urinary tract is your body's drainage system for removing wastes and extra water. Your urinary tract includes two kidneys, two ureters, a bladder, and a urethra. Your kidneys are a pair of bean-shaped organs. Each kidney is about the size of your fist. They are located below your ribs, one on each side of your spine. °CAUSES °Infections are caused by microbes, which are microscopic organisms, including fungi, viruses, and bacteria. These organisms are so small that they can only be seen through a microscope. Bacteria are the microbes that most commonly cause UTIs. °SYMPTOMS  °Symptoms of UTIs may vary by age and gender of the patient and by the location of the infection. Symptoms in young women typically include a frequent and intense urge to urinate and a painful, burning feeling in the bladder or urethra during urination.   Older women and men are more likely to be tired, shaky, and weak and have muscle aches and abdominal pain. A fever may mean the infection is in your kidneys. Other symptoms of a kidney infection include pain in your back or sides below the ribs, nausea, and vomiting. °DIAGNOSIS °To diagnose a UTI,  your caregiver will ask you about your symptoms. Your caregiver will also ask you to provide a urine sample. The urine sample will be tested for bacteria and white blood cells. White blood cells are made by your body to help fight infection. °TREATMENT  °Typically, UTIs can be treated with medication. Because most UTIs are caused by a bacterial infection, they usually can be treated with the use of antibiotics. The choice of antibiotic and length of treatment depend on your symptoms and the type of bacteria causing your infection. °HOME CARE INSTRUCTIONS °· If you were prescribed antibiotics, take them exactly as your caregiver instructs you. Finish the medication even if you feel better after you have only taken some of the medication. °· Drink enough water and fluids to keep your urine clear or pale yellow. °· Avoid caffeine, tea, and carbonated beverages. They tend to irritate your bladder. °· Empty your bladder often. Avoid holding urine for long periods of time. °· Empty your bladder before and after sexual intercourse. °· After a bowel movement, women should cleanse from front to back. Use each tissue only once. °SEEK MEDICAL CARE IF:  °· You have back pain. °· You develop a fever. °· Your symptoms do not begin to resolve within 3 days. °SEEK IMMEDIATE MEDICAL CARE IF:  °· You have severe back pain or lower abdominal pain. °· You develop chills. °· You have nausea or vomiting. °· You have continued burning or discomfort with urination. °MAKE SURE YOU:  °· Understand these instructions. °· Will watch your condition. °· Will get help right away if you are not doing well or get worse. °  °This information is not intended to replace advice given to you by your health care provider. Make sure you discuss any questions you have with your health care provider. °  °Document Released: 03/20/2005 Document Revised: 03/01/2015 Document Reviewed: 07/19/2011 °Elsevier Interactive Patient Education ©2016 Elsevier Inc. ° °

## 2015-09-01 NOTE — ED Notes (Addendum)
Patient is maintaining pulse ox between 86% to 91% on Room air. Pulse ox switched to left hand due to chronic numbness in patient's right hand. MD aware. Patient still is refusing to stay.

## 2015-09-01 NOTE — Progress Notes (Signed)
Patient is having expiratory wheezes and there are no duoneb on board and she's also requesting for Melatonin to be able to sleep. Dr. Jannifer Franklin notified with new order for Dueneb  PRN every 4 hours and Ramelteom 8 mg at bedtime for insomnia. Will continue to monitor.

## 2015-09-01 NOTE — ED Notes (Signed)
Pt was seen on Monday for a fall wih hitting her head, pt states that this morning she has been c/o headache, feeling nauseated, weak and just feeling like she couldn't make it. Pt has a room air sat of 83%

## 2015-09-01 NOTE — Consult Note (Signed)
Pharmacy Antibiotic Note  [name] is a 82 y.o. female admitted on 09/01/2015 with UTI.  Pharmacy has been consulted for levaquin dosing.  Plan: Initiate levaquin 250mg  PO daily for uncomplicated UTI with recommended course of 3 days.  Height: 5\' 7"  (170.2 cm) Weight: 170 lb (77.111 kg) IBW/kg (Calculated) : 61.6  Temp (24hrs), Avg:98.2 F (36.8 C), Min:98.2 F (36.8 C), Max:98.2 F (36.8 C)   Recent Labs Lab 09/01/15 1121  WBC 12.6*  CREATININE 1.07*    Estimated Creatinine Clearance: 38.9 mL/min (by C-G formula based on Cr of 1.07).    Allergies  Allergen Reactions  . Ceftriaxone Swelling  . Cefuroxime Hives  . Cephalosporins Swelling and Other (See Comments)    Reaction:  Fainting/dry mouth   . Codeine Nausea And Vomiting  . Epinephrine Other (See Comments)    Reaction:  Fainting   . Morphine And Related Other (See Comments)    Reaction:  GI upset   . Buprenorphine Hcl Nausea And Vomiting  . Ciprofloxacin Itching, Swelling and Rash  . Latex Rash  . Lidocaine Rash  . Procaine Rash    Antimicrobials this admission: Anti-infectives    Start     Dose/Rate Route Frequency Ordered Stop   09/01/15 1545  Levofloxacin (LEVAQUIN) IVPB 250 mg  Status:  Discontinued     250 mg 50 mL/hr over 60 Minutes Intravenous Every 24 hours 09/01/15 1541 09/01/15 1543   09/01/15 1545  levofloxacin (LEVAQUIN) tablet 250 mg     250 mg Oral Daily 09/01/15 1543     09/01/15 1230  ciprofloxacin (CIPRO) IVPB 400 mg     400 mg 200 mL/hr over 60 Minutes Intravenous  Once 09/01/15 1215 09/01/15 1328      Microbiology results: Results for orders placed or performed in visit on 08/19/13  Urine culture     Status: None   Collection Time: 08/19/13  2:55 PM  Result Value Ref Range Status   Micro Text Report   Final       SOURCE: CLEAN CATCH    ORGANISM 1                >100,000 CFU/ML ESCHERICHIA COLI   COMMENT                   WITH MIXED-BACTERIAL-ORGANISMS   ANTIBIOTIC                     ORG#1     AMPICILLIN                    S         CEFAZOLIN                     S         CEFOXITIN                     S         CEFTRIAXONE                   S         CIPROFLOXACIN                 S         GENTAMICIN                    S         IMIPENEM  S         LEVOFLOXACIN                  S         NITROFURANTOIN                S         TRIMETHOPRIM/SULFAMETHOXAZOLE S           Stool culture     Status: None   Collection Time: 08/19/13  9:41 PM  Result Value Ref Range Status   Micro Text Report   Final       COMMENT                   NO SALMONELLA OR SHIGELLA ISOLATED   COMMENT                   NO PATHOGENIC E.COLI DETECTED   COMMENT                   NO CAMPYLOBACTER ANTIGEN DETECTED   ANTIBIOTIC                                                      Clostridium Difficile [facility])     Status: None   Collection Time: 08/19/13  9:41 PM  Result Value Ref Range Status   Micro Text Report   Final       C.DIFFICILE ANTIGEN       C.DIFFICILE GDH ANTIGEN : NEGATIVE   C.DIFFICILE TOXIN A/B     C.DIFFICILE TOXINS A AND B : NEGATIVE   INTERPRETATION            Negative for C. difficile.    ANTIBIOTIC                                                      Urine culture     Status: None   Collection Time: 08/20/13 11:40 AM  Result Value Ref Range Status   Micro Text Report   Final       SOURCE: CLEAN CATCH    ORGANISM 1                >100,000 CFU/ML ESCHERICHIA COLI   ORGANISM 2                >100,000 CFU ENTEROCOCCUS FAECALIS   COMMENT                   -   ANTIBIOTIC                    ORG#1    ORG#2     AMPICILLIN                    S        S         CEFAZOLIN                     S                  CEFOXITIN  S                  CEFTRIAXONE                   S                  CIPROFLOXACIN                 S        S         GENTAMICIN                    S                  IMIPENEM                      S                   LEVOFLOXACIN                  S        S         NITROFURANTOIN                S        S         TRIMETHOPRIM/SULFAMETHOXAZOLE S                  LINEZOLID                              S         TETRACYCLINE                           R             Thank you for allowing pharmacy to be a part of this patient's care.  Vena Rua 09/01/2015 3:42 PM

## 2015-09-01 NOTE — Progress Notes (Signed)
Patient admitted to telemetry this evening. Tele box verified with CCMD and Gerald Stabs, NT. Skin verified with Junious Dresser, RN. Husband is at bedside. Patient is currently on 2L of acute oxygen. Paced on tele. Vitals are stable. Oriented to room and call system. Will continue to monitor.

## 2015-09-01 NOTE — ED Provider Notes (Addendum)
[facility] Emergency Department Provider Note     Time seen: ----------------------------------------- 10:31 AM on 09/01/2015 -----------------------------------------    I have reviewed the triage vital signs and the nursing notes.   HISTORY  Chief Complaint No chief complaint on file.    HPI [name] is a 82 y.o. female brought to the ER for weakness, nausea and vomiting. Patient has persistent headache for recent fall. Patient was seen here in the ER and had a normal CT scan after hitting the back for head when she lost her balance putting on her sweater. She still is having a headache, feeling very weak and lethargic. She denies any fevers, chills, or chest pain but has had some nasal drainage.   Past Medical History  Diagnosis Date  . CHF (congestive heart failure) ([place])   . Diabetes mellitus without complication ([place])   . Hypertension   . Chronic back pain   . Cancer [facility])     skin ca    Patient Active Problem List   Diagnosis Date Noted  . Chronic diastolic heart failure ([place]) 04/10/2015  . Atrial fibrillation ([place]) 04/10/2015  . Essential hypertension 04/10/2015  . Diabetes ([place]) 04/10/2015  . Back pain 04/10/2015  . Acute on chronic systolic CHF (congestive heart failure) ([place]) 03/21/2015  . Pulmonary nodule 11/10/2014    Past Surgical History  Procedure Laterality Date  . Pacemaker placement    . Leg surgery    . Abdominal hysterectomy    . Tonsillectomy    . Back surgery      x3  . Coronary stent placement      unknown location per pt  . Foot surgery      Allergies Ceftriaxone; Cefuroxime; Cephalosporins; Codeine; Epinephrine; Morphine and related; Rocephin; Buprenorphine hcl; Ciprofloxacin; Latex; Lidocaine; and Procaine  Social History Social History  Substance Use Topics  . Smoking status: Never Smoker   . Smokeless tobacco: Never Used  . Alcohol Use: No    Review of Systems Constitutional: Negative  for fever. Eyes: Negative for visual changes. ENT: Negative for sore throat, positive for nasal drainage Cardiovascular: Negative for chest pain. Respiratory: Negative for shortness of breath. Gastrointestinal: Negative for abdominal pain, vomiting and diarrhea. Genitourinary: Negative for dysuria. Musculoskeletal: Negative for back pain. Skin: Negative for rash. Neurological: Negative for headaches, Positive for weakness  10-point ROS otherwise negative.  ____________________________________________   PHYSICAL EXAM:  VITAL SIGNS: ED Triage Vitals  Enc Vitals Group     BP --      Pulse --      Resp --      Temp --      Temp src --      SpO2 --      Weight --      Height --      Head Cir --      Peak Flow --      Pain Score --      Pain Loc --      Pain Edu? --      Excl. in [place]? --     Constitutional: Alert and oriented. No acute distress Eyes: Conjunctivae are normal. PERRL. Normal extraocular movements. ENT   Head: Midline occipital scalp hematoma   Nose: No congestion/rhinnorhea.   Mouth/Throat: Mucous membranes are moist.   Neck: No stridor. Cardiovascular: Normal rate, regular rhythm. Normal and symmetric distal pulses are present in all extremities. No murmurs, rubs, or gallops. Respiratory: Normal respiratory effort without tachypnea nor retractions.  Breath sounds are clear and equal bilaterally. No wheezes/rales/rhonchi. Gastrointestinal: Soft and nontender. No distention. No abdominal bruits.  Musculoskeletal: Nontender with normal range of motion in all extremities. No joint effusions.  No lower extremity tenderness nor edema. Neurologic:  Normal speech and language. Generalized weakness, nothing focal Skin:  Skin is warm, dry and intact. Pallor is noted ____________________________________________  EKG: Interpreted by me.Ventricular paced rhythm with a rate of 69 bpm, no other acute  changes.  ____________________________________________  ED COURSE:  Pertinent labs & imaging results that were available during my care of the patient were reviewed by me and considered in my medical decision making (see chart for details). Patient is no acute distress, weakness of uncertain etiology. We'll repeat her brain scan and get basic labs. ____________________________________________    LABS (pertinent positives/negatives)  Labs Reviewed  CBC WITH DIFFERENTIAL/PLATELET - Abnormal; Notable for the following:    WBC 12.6 (*)    RBC 3.23 (*)    Hemoglobin 9.4 (*)    HCT 29.2 (*)    RDW 15.0 (*)    Neutro Abs 11.3 (*)    Lymphs Abs 0.4 (*)    All other components within normal limits  COMPREHENSIVE METABOLIC PANEL - Abnormal; Notable for the following:    Glucose, Bld 225 (*)    BUN 35 (*)    Creatinine, Ser 1.07 (*)    Calcium 8.4 (*)    Total Protein 6.4 (*)    Albumin 3.2 (*)    ALT 11 (*)    GFR calc non Af Amer 45 (*)    GFR calc Af Amer 52 (*)    All other components within normal limits  URINALYSIS COMPLETEWITH MICROSCOPIC ([facility] ONLY) - Abnormal; Notable for the following:    Color, Urine YELLOW (*)    APPearance HAZY (*)    Hgb urine dipstick 1+ (*)    Protein, ur 30 (*)    Nitrite POSITIVE (*)    Leukocytes, UA 3+ (*)    Bacteria, UA MANY (*)    Squamous Epithelial / LPF 0-5 (*)    All other components within normal limits  PROTIME-INR - Abnormal; Notable for the following:    Prothrombin Time 23.5 (*)    All other components within normal limits  URINE CULTURE  TROPONIN I    RADIOLOGY Images were viewed by me  CT head, chest x-ray  IMPRESSION: 1. Resolving left occipital scalp hematoma without underlying fracture. 2. No acute intracranial abnormality. 3. Stable atrophy and white matter disease. IMPRESSION: Central mild vascular congestion and mild perihilar interstitial prominence without convincing pulmonary edema. Streaky  atelectasis or infiltrate in right middle lobe. Dual lead cardiac pacemaker is unchanged in position.  ____________________________________________  FINAL ASSESSMENT AND PLAN  Weakness, Cystitis, Hypoxia  Plan: Patient with labs and imaging as dictated above. Patient is in no acute distress but does appear weak. This is likely secondary to her significant UTI. She has received a dose of IV Cipro as well as a liter of normal saline. Initially she had declined admission, but with her oxygen levels being low I have advised at least observation. She could certainly have pneumonia as well.   Earleen Newport, MD   Earleen Newport, MD 09/01/15 [place], MD 09/01/15 [phone]

## 2015-09-01 NOTE — ED Notes (Signed)
Patient's husband came out to speak with Dr. Jimmye Norman about the possibility of patient being admitted.

## 2015-09-01 NOTE — H&P (Signed)
[facility] [place] NAME: Nataija Sirkin    MR#:  [id number]  DATE OF BIRTH:  25-Sep-1924  DATE OF ADMISSION:  09/01/2015  PRIMARY CARE PHYSICIAN: Tracie Harrier, MD   REQUESTING/REFERRING PHYSICIAN: Lauree Chandler CHIEF COMPLAINT: generalized weakness   Chief Complaint  Patient presents with  . Headache    HISTORY OF PRESENT ILLNESS:  [name]  is a 82 y.o. female with a known history of medical problems diastolic  Heart  failure, diabetes mellitus type 2, chronic back pain, hypertension, comes in because of generalized weakness. Patient saw Dr. Ginette Pitman  2 days ago for fall and the she had a hematoma on the scalp. yesterday he called in Bactrim for her for UTI. Took 1 dose. Today she came because of persistent weakness. Patient also is hypoxic with O2 saturation between 79-89 % on room air . He denies shortness of breath or cough. Past Medical History  Diagnosis Date  . CHF (congestive heart failure) ([place])   . Diabetes mellitus without complication ([place])   . Hypertension   . Chronic back pain   . Cancer ([place])     skin ca    PAST SURGICAL HISTOIRY:   Past Surgical History  Procedure Laterality Date  . Pacemaker placement    . Leg surgery    . Abdominal hysterectomy    . Tonsillectomy    . Back surgery      x3  . Coronary stent placement      unknown location per pt  . Foot surgery      SOCIAL HISTORY:   Social History  Substance Use Topics  . Smoking status: Never Smoker   . Smokeless tobacco: Never Used  . Alcohol Use: No    FAMILY HISTORY:   Family History  Problem Relation Age of Onset  . CVA Mother   . Diabetes Mellitus II Sister   . Heart disease Father     DRUG ALLERGIES:   Allergies  Allergen Reactions  . Ceftriaxone Swelling  . Cefuroxime Hives  . Cephalosporins Swelling and Other (See Comments)    Reaction:  Fainting/dry mouth   . Codeine Nausea And Vomiting  . Epinephrine Other (See  Comments)    Reaction:  Fainting   . Morphine And Related Other (See Comments)    Reaction:  GI upset   . Buprenorphine Hcl Nausea And Vomiting  . Ciprofloxacin Itching, Swelling and Rash  . Latex Rash  . Lidocaine Rash  . Procaine Rash    REVIEW OF SYSTEMS:  CONSTITUTIONAL: No fever, fatigue or weakness.  EYES: No blurred or double vision.  EARS, NOSE, AND THROAT: No tinnitus or ear pain.  RESPIRATORY: as some cough and shortness of breath. RDIOVASCULAR: No chest pain, orthopnea, edema.  GASTROINTESTINAL: No nausea, vomiting, diarrhea or abdominal pain.  GENITOURINARY: No dysuria, hematuria.  ENDOCRINE: No polyuria, nocturia,  HEMATOLOGY: No anemia, easy bruising or bleeding SKIN: No rash or lesion. MUSCULOSKELETAL: No joint pain or arthritis.   NEUROLOGIC: No tingling, numbness, weakness.  PSYCHIATRY: No anxiety or depression.   MEDICATIONS AT HOME:   Prior to Admission medications   Medication Sig Start Date End Date Taking? Authorizing Provider  apixaban (ELIQUIS) 5 MG TABS tablet Take 5 mg by mouth 2 (two) times daily.   Yes Historical Provider, MD  aspirin EC 81 MG tablet Take 81 mg by mouth at bedtime.    Yes Historical Provider, MD  carvedilol (COREG) 6.25 MG tablet Take 6.25  mg by mouth 2 (two) times daily with a meal.    Yes Historical Provider, MD  cholecalciferol (VITAMIN D) 1000 units tablet Take 1,000 Units by mouth daily.   Yes Historical Provider, MD  clotrimazole (MYCELEX) 10 MG troche Take 10 mg by mouth 5 (five) times daily. 08/27/15 09/01/15 Yes Historical Provider, MD  docusate sodium (COLACE) 100 MG capsule Take 100 mg by mouth 2 (two) times daily.    Yes Historical Provider, MD  escitalopram (LEXAPRO) 10 MG tablet Take 10 mg by mouth daily.   Yes Historical Provider, MD  fentaNYL (DURAGESIC - DOSED MCG/HR) 50 MCG/HR Place 50 mcg onto the skin every other day.    Yes Historical Provider, MD  ferrous sulfate 325 (65 FE) MG tablet Take 325 mg by mouth daily with  breakfast.   Yes Historical Provider, MD  furosemide (LASIX) 40 MG tablet Take 40 mg by mouth every other day. Pt alternates with the 80mg  tablet.   Yes Historical Provider, MD  furosemide (LASIX) 80 MG tablet Take 80 mg by mouth every other day. Pt alternates with the 40mg  tablet.   Yes Historical Provider, MD  glimepiride (AMARYL) 4 MG tablet Take 4 mg by mouth 2 (two) times daily after a meal.   Yes Historical Provider, MD  losartan (COZAAR) 25 MG tablet Take 25 mg by mouth daily.   Yes Historical Provider, MD  magnesium oxide (MAG-OX) 400 MG tablet Take 400 mg by mouth daily.   Yes Historical Provider, MD  Melatonin 1 MG TABS Take 1 mg by mouth at bedtime.   Yes Historical Provider, MD  metolazone (ZAROXOLYN) 2.5 MG tablet Take 2.5 mg by mouth 2 (two) times a week. Pt takes on Monday and Friday.   Yes Historical Provider, MD  oxyCODONE (OXY IR/ROXICODONE) 5 MG immediate release tablet Take 5-10 mg by mouth every 4 (four) hours as needed for severe pain.    Yes Historical Provider, MD  pantoprazole (PROTONIX) 40 MG tablet Take 40 mg by mouth daily.   Yes Historical Provider, MD  rOPINIRole (REQUIP) 0.5 MG tablet Take 0.5 mg by mouth 2 (two) times daily.   Yes Historical Provider, MD  sitaGLIPtin (JANUVIA) 100 MG tablet Take 100 mg by mouth daily.   Yes Historical Provider, MD  spironolactone (ALDACTONE) 25 MG tablet Take 25 mg by mouth daily.    Yes Historical Provider, MD  sulfamethoxazole-trimethoprim (BACTRIM DS,SEPTRA DS) 800-160 MG tablet Take 1 tablet by mouth 2 (two) times daily. 08/31/15 09/07/15 Yes Historical Provider, MD  vitamin B-12 (CYANOCOBALAMIN) 1000 MCG tablet Take 1,000 mcg by mouth daily.   Yes Historical Provider, MD  vitamin C (ASCORBIC ACID) 500 MG tablet Take 500 mg by mouth daily.   Yes Historical Provider, MD  Vitamin D, Ergocalciferol, (DRISDOL) 50000 UNITS CAPS capsule Take 50,000 Units by mouth every 7 (seven) days. Pt takes on Thursday.   Yes Historical Provider, MD       VITAL SIGNS:  Blood pressure 131/69, pulse 67, temperature 98.2 F (36.8 C), temperature source Oral, resp. rate 20, height 5\' 7"  (1.702 m), weight 77.111 kg (170 lb), SpO2 92 %.  PHYSICAL EXAMINATION:  GENERAL:  82 y.o.-year-old patient lying in the bed with no acute distress. overall ill looking female.Marland Kitchen  EYES: Pupils equal, round, reactive to light and accommodation. No scleral icterus. Extraocular muscles intact.  HEENT: Head atraumatic, normocephalic. Oropharynx and nasopharynx clear.  NECK:  Supple, no jugular venous distention. No thyroid enlargement, no tenderness.  LUNGS:faint bilateral  crepitations present ,no wheezing. use of accessory muscles of respiration.  CARDIOVASCULAR: S1, S2 normal. No murmurs, rubs, or gallops.  ABDOMEN: Soft, nontender, nondistended. Bowel sounds present. No organomegaly or mass.  EXTREMITIES: No pedal edema, cyanosis, or clubbing.  NEUROLOGIC: Cranial nerves II through XII are intact. Muscle strength 5/5 in all extremities. Sensation intact. Gait not checked.  PSYCHIATRIC: The patient is alert and oriented x 3.  SKIN: No obvious rash, lesion, or ulcer.   LABORATORY PANEL:   CBC  Recent Labs Lab 09/01/15 1121  WBC 12.6*  HGB 9.4*  HCT 29.2*  PLT 160   ------------------------------------------------------------------------------------------------------------------  Chemistries   Recent Labs Lab 09/01/15 1121  NA 136  K 4.3  CL 101  CO2 30  GLUCOSE 225*  BUN 35*  CREATININE 1.07*  CALCIUM 8.4*  AST 16  ALT 11*  ALKPHOS 68  BILITOT 1.0   ------------------------------------------------------------------------------------------------------------------  Cardiac Enzymes  Recent Labs Lab 09/01/15 1121  TROPONINI <0.03   ------------------------------------------------------------------------------------------------------------------  RADIOLOGY:  Dg Chest 1 View  09/01/2015  CLINICAL DATA:  Weakness, cough EXAM: CHEST 1  VIEW COMPARISON:  08/22/2015 FINDINGS: Cardiomegaly again noted. Dual lead cardiac pacemaker is unchanged in position. There is central mild vascular congestion and mild perihilar interstitial prominence without convincing pulmonary edema. Streaky atelectasis or early infiltrate in right middle lobe. Metallic fixation rods are noted lower thoracic and lumbar spine. IMPRESSION: Central mild vascular congestion and mild perihilar interstitial prominence without convincing pulmonary edema. Streaky atelectasis or infiltrate in right middle lobe. Dual lead cardiac pacemaker is unchanged in position. Electronically Signed   By: Lahoma Crocker M.D.   On: 09/01/2015 10:52   Ct Head Wo Contrast  09/01/2015  CLINICAL DATA:  Headache and nausea. Hypoxia. [place] and hit head 4 days ago. EXAM: CT HEAD WITHOUT CONTRAST TECHNIQUE: Contiguous axial images were obtained from the base of the skull through the vertex without intravenous contrast. COMPARISON:  CT head without contrast 08/28/2015. FINDINGS: A left occipital scalp hematoma in continues to resolve. There is no underlying fracture. Mild generalized atrophy and white matter disease is stable. No acute cortical infarct, hemorrhage, or mass lesion is present. The basal ganglia are within normal limits. The insular ribbon is intact bilaterally. No significant extra-axial fluid collection is present. The paranasal sinuses and mastoid air cells are clear. Atherosclerotic calcifications are again noted at the cavernous internal carotid arteries. The globes and orbits are intact. Senescent calcifications are present. IMPRESSION: 1. Resolving left occipital scalp hematoma without underlying fracture. 2. No acute intracranial abnormality. 3. Stable atrophy and white matter disease. Electronically Signed   By: San Morelle M.D.   On: 09/01/2015 11:19    EKG:   Orders placed or performed during the hospital encounter of 09/01/15  . ED EKG  . ED EKG    EKG showed pacer  rhythm at 69 bpm  IMPRESSION AND PLAN:   1.Generalized  weakness multifactorial secondary to UTI, possibly developing early pneumonia on the right middle lobe. Admit to medical floor, start Levaquin, follow culture data. #2 possible acute on chronic diastolic heart failure: Check a BNP, continue oxygen, continue IV Lasix. #3 acute respiratory failure with hypoxia, needing 2 L of oxygen to keep saturations more than 90%. Acute respiratory failure likely secondary to early pneumonia versus CHF. Continue IV Lasix, Levaquin. #4 diabetes mellitus type 2: Amaryl, continue sliding scale with coverage. #5 dilated cardiomyopathy #6 depression continue Lexapro.  #7 chronic atrial fibrillation: Status post pacemaker patient is on Eliquis. #8 chronic  back pain: Patient the takes the baclofen, pain medications.     All the records are reviewed and case discussed with ED provider. Management plans discussed with the patient, family and they are in agreement.  CODE STATUS: full  TOTAL TIME TAKING CARE OF THIS PATIENT: 63minutes.    Epifanio Lesches M.D on 09/01/2015 at 3:34 PM  Between 7am to 6pm - Pager - [phone]  After 6pm go to [email or web address] - password [email or web address] [facility]  Office  [phone]  CC: Primary care physician; Tracie Harrier, MD  Note: This dictation was prepared with Dragon dictation along with smaller phrase technology. Any transcriptional errors that result from this process are unintentional.

## 2015-09-02 ENCOUNTER — Inpatient Hospital Stay
Admit: 2015-09-02 | Discharge: 2015-09-02 | Disposition: A | Discharge status: Home / Self Care | Payer: Medicare Other | Attending: Internal Medicine | Admitting: Internal Medicine

## 2015-09-02 LAB — GLUCOSE, CAPILLARY
GLUCOSE-CAPILLARY: 127 mg/dL — AB (ref 65–99)
GLUCOSE-CAPILLARY: 189 mg/dL — AB (ref 65–99)
GLUCOSE-CAPILLARY: 155 mg/dL — AB (ref 65–99)
GLUCOSE-CAPILLARY: 138 mg/dL — AB (ref 65–99)

## 2015-09-02 LAB — ECHOCARDIOGRAM COMPLETE
Height: 67 in
Weight: 2828.8 oz

## 2015-09-02 LAB — BASIC METABOLIC PANEL
Anion gap: 8 (ref 5–15)
BUN: 29 mg/dL — AB (ref 6–20)
CHLORIDE: 100 mmol/L — AB (ref 101–111)
CO2: 26 mmol/L (ref 22–32)
CREATININE: 0.88 mg/dL (ref 0.44–1.00)
Calcium: 8.4 mg/dL — ABNORMAL LOW (ref 8.9–10.3)
GFR calc Af Amer: >60 mL/min (ref >60)
GFR calc non Af Amer: 57 mL/min — ABNORMAL LOW (ref >60)
GLUCOSE: 136 mg/dL — AB (ref 65–99)
POTASSIUM: 4.3 mmol/L (ref 3.5–5.1)
SODIUM: 134 mmol/L — AB (ref 135–145)

## 2015-09-02 LAB — CBC
HEMATOCRIT: 27.7 % — AB (ref 35.0–47.0)
HEMOGLOBIN: 9.2 g/dL — AB (ref 12.0–16.0)
MCH: 29.6 pg (ref 26.0–34.0)
MCHC: 33.1 g/dL (ref 32.0–36.0)
MCV: 89.4 fL (ref 80.0–100.0)
Platelets: 147 10*3/uL — ABNORMAL LOW (ref 150–440)
RBC: 3.09 MIL/uL — AB (ref 3.80–5.20)
RDW: 15.1 % — ABNORMAL HIGH (ref 11.5–14.5)
WBC: 10.3 10*3/uL (ref 3.6–11.0)

## 2015-09-02 MED ORDER — FENTANYL 12 MCG/HR TD PT72
25.0000 ug | MEDICATED_PATCH | TRANSDERMAL | Status: DC
  Administered 2015-09-02: 25 ug via TRANSDERMAL
  Filled 2015-09-02: qty 2

## 2015-09-02 NOTE — Progress Notes (Signed)
[facility] [name]    MR#:  [id number]  DATE OF BIRTH:  03-08-1925  SUBJECTIVE:  CHIEF COMPLAINT:   Chief Complaint  Patient presents with  . Headache   - Patient came in with difficulty breathing and headache. Chest x-ray showing pneumonia. Also urine analysis with infection. It just started on Bactrim as an outpatient. -Still has some dyspnea on talking. Patient very anxious to go home  REVIEW OF SYSTEMS:  Review of Systems  Constitutional: Negative for fever and chills.  HENT: Negative for ear discharge, ear pain and nosebleeds.   Eyes: Negative for blurred vision.  Respiratory: Positive for cough, shortness of breath and wheezing.   Cardiovascular: Positive for leg swelling. Negative for chest pain and palpitations.  Gastrointestinal: Negative for nausea, vomiting, abdominal pain, diarrhea and constipation.  Genitourinary: Negative for dysuria and frequency.  Musculoskeletal: Positive for back pain. Negative for myalgias and neck pain.  Neurological: Negative for dizziness, tremors, sensory change, speech change, seizures and headaches.  Psychiatric/Behavioral: Negative for depression.    DRUG ALLERGIES:   Allergies  Allergen Reactions  . Ceftriaxone Swelling  . Cefuroxime Hives  . Cephalosporins Swelling and Other (See Comments)    Reaction:  Fainting/dry mouth   . Codeine Nausea And Vomiting  . Epinephrine Other (See Comments)    Reaction:  Fainting   . Morphine And Related Other (See Comments)    Reaction:  GI upset   . Buprenorphine Hcl Nausea And Vomiting  . Ciprofloxacin Itching, Swelling and Rash  . Latex Rash  . Lidocaine Rash  . Procaine Rash    VITALS:  Blood pressure 140/59, pulse 74, temperature 97.8 F (36.6 C), temperature source Oral, resp. rate 16, height 5\' 7"  (1.702 m), weight 80.196 kg (176 lb 12.8 oz), SpO2 94 %.  PHYSICAL EXAMINATION:  Physical Exam  GENERAL:  82  y.o.-year-old patient lying in the bed with no acute distress.  EYES: Pupils equal, round, reactive to light and accommodation. No scleral icterus. Extraocular muscles intact.  HEENT: Head atraumatic, normocephalic. Oropharynx and nasopharynx clear.  NECK:  Supple, no jugular venous distention. No thyroid enlargement, no tenderness.  LUNGS:Scattered expiratory wheezes and rhonchi posteriorly. Scant breath sounds at right base. No rales or crepitation. No use of accessory muscles of respiration.  CARDIOVASCULAR: S1, S2 normal. No rubs, or gallops. 3/6 systolic murmur is present ABDOMEN: Soft, nontender, nondistended. Bowel sounds present. No organomegaly or mass.  EXTREMITIES: No  cyanosis, or clubbing. 1+ bilateral pedal edema noted NEUROLOGIC: Cranial nerves II through XII are intact. Muscle strength 5/5 in all extremities. Sensation intact. Gait not checked.  PSYCHIATRIC: The patient is alert and oriented x 3.  SKIN: No obvious rash, lesion, or ulcer.    LABORATORY PANEL:   CBC  Recent Labs Lab 09/02/15 0449  WBC 10.3  HGB 9.2*  HCT 27.7*  PLT 147*   ------------------------------------------------------------------------------------------------------------------  Chemistries   Recent Labs Lab 09/01/15 1121 09/02/15 0449  NA 136 134*  K 4.3 4.3  CL 101 100*  CO2 30 26  GLUCOSE 225* 136*  BUN 35* 29*  CREATININE 1.07* 0.88  CALCIUM 8.4* 8.4*  AST 16  --   ALT 11*  --   ALKPHOS 68  --   BILITOT 1.0  --    ------------------------------------------------------------------------------------------------------------------  Cardiac Enzymes  Recent Labs Lab 09/01/15 1121  TROPONINI <0.03   ------------------------------------------------------------------------------------------------------------------  RADIOLOGY:  Dg Chest 1 View  09/01/2015  CLINICAL  DATA:  Weakness, cough EXAM: CHEST 1 VIEW COMPARISON:  08/22/2015 FINDINGS: Cardiomegaly again noted. Dual lead  cardiac pacemaker is unchanged in position. There is central mild vascular congestion and mild perihilar interstitial prominence without convincing pulmonary edema. Streaky atelectasis or early infiltrate in right middle lobe. Metallic fixation rods are noted lower thoracic and lumbar spine. IMPRESSION: Central mild vascular congestion and mild perihilar interstitial prominence without convincing pulmonary edema. Streaky atelectasis or infiltrate in right middle lobe. Dual lead cardiac pacemaker is unchanged in position. Electronically Signed   By: Lahoma Crocker M.D.   On: 09/01/2015 10:52   Ct Head Wo Contrast  09/01/2015  CLINICAL DATA:  Headache and nausea. Hypoxia. [place] and hit head 4 days ago. EXAM: CT HEAD WITHOUT CONTRAST TECHNIQUE: Contiguous axial images were obtained from the base of the skull through the vertex without intravenous contrast. COMPARISON:  CT head without contrast 08/28/2015. FINDINGS: A left occipital scalp hematoma in continues to resolve. There is no underlying fracture. Mild generalized atrophy and white matter disease is stable. No acute cortical infarct, hemorrhage, or mass lesion is present. The basal ganglia are within normal limits. The insular ribbon is intact bilaterally. No significant extra-axial fluid collection is present. The paranasal sinuses and mastoid air cells are clear. Atherosclerotic calcifications are again noted at the cavernous internal carotid arteries. The globes and orbits are intact. Senescent calcifications are present. IMPRESSION: 1. Resolving left occipital scalp hematoma without underlying fracture. 2. No acute intracranial abnormality. 3. Stable atrophy and white matter disease. Electronically Signed   By: San Morelle M.D.   On: 09/01/2015 11:19    EKG:   Orders placed or performed during the hospital encounter of 09/01/15  . ED EKG  . ED EKG    ASSESSMENT AND PLAN:   82y/o F with PMH of diastolic CHF, DM, chronic back pain, HTN  admitted for weakness- noted to have UTI and pneumonia  #1 RML pneumonia- blood cultures are pending -On Levaquin for now. Currently requiring 3 L of oxygen. Patient is not on home oxygen. Wean oxygen as tolerated. -Scattered wheezing on exam, no history of COPD or smoking. Nebs as tolerated.  #2 UTI-started on Bactrim as outpatient. Urine cultures are pending. -Continue Levaquin at this time.  #3 acute respiratory failure with hypoxia-secondary to CHF and pneumonia. Continue O2 support and management as described above.  #4 acute on chronic diastolic CHF exacerbation-continue O2 support and wean as tolerated. On IV Lasix and metolazone twice a week. -Follow up chest x-ray. -On Coreg and losartan -No recent echo. So check echocardiogram  #5 diabetes mellitus-on glimepiride, tradjenta and also sliding scale insulin  #6 chronic atrial fibrillation-rate controlled. Address post pacemaker and on eliquis for anticoagulation  #7 chronic back pain-continue when necessary pain medications  #8 DVT prophylaxis-on eliquis   Physical therapy consulted. Patient stays at home with her husband. Has a walker to ambulate secondary to her low back pain.   All the records are reviewed and case discussed with Care Management/Social Workerr. Management plans discussed with the patient, family and they are in agreement.  CODE STATUS: Full Code  TOTAL TIME TAKING CARE OF THIS PATIENT: 37 minutes.   POSSIBLE D/C IN 1-2 DAYS, DEPENDING ON CLINICAL CONDITION.   Gladstone Lighter M.D on 09/02/2015 at 10:30 AM  Between 7am to 6pm - Pager - [phone]  After 6pm go to [email or web address] - password [email or web address] [facility]  Office  [phone]  CC: Primary care physician; Tracie Harrier, MD

## 2015-09-02 NOTE — Progress Notes (Signed)
*  PRELIMINARY RESULTS* Echocardiogram 2D Echocardiogram has been performed.  [name] 09/02/2015, 11:31 AM

## 2015-09-02 NOTE — Progress Notes (Signed)
Physical Therapy Evaluation Patient Details [name] MRN: [id number] DOB: 01-20-1925 Today's Date: 09/02/2015   History of Present Illness  Patient is an 82 y.o. female who presented to ED on 01 September 2015 after seeing Dr. Ginette Pitman 2 days prior after sustaining a fall and subsquent head injury. Was found to have a UTI, hypoxia, and pneumonia.  Clinical Impression  Patient is a previously modified independent female for household ambulation who requires assistance from husband for performing chores/shopping. Patient recently experienced a fall, leading to a contusion on head. On evaluation, patient utilizing 2 L O2, which she denies using at home. Oxygen saturations remained around 92% after 60' ambulation, but they likely would have dropped had she not been on oxygen. While patient is at baseline level of function and does not require further PT services, she may benefit from home use of oxygen to prevent hypoxia and falls in the future.    Follow Up Recommendations No PT follow up    Equipment Recommendations  None recommended by PT    Recommendations for Other Services       Precautions / Restrictions Precautions Precautions: Fall Restrictions Weight Bearing Restrictions: No      Mobility  Bed Mobility Overal bed mobility: Independent                Transfers Overall transfer level: Modified independent Equipment used: Rolling walker (2 wheeled)             General transfer comment: Patient able to perform sit to stand transfer from bed and toilet with modified independence.  Ambulation/Gait Ambulation/Gait assistance: Min guard Ambulation Distance (Feet): 60 Feet Assistive device: Rolling walker (2 wheeled)       General Gait Details: Patient ambulates 26' with RW and CGA on 2 L O2. Patient's oxygen saturations stayed ~92%. No SOB/dizziness.  Stairs            Wheelchair Mobility    Modified Rankin (Stroke Patients Only)        Balance Overall balance assessment: History of Falls;Modified Independent                                           Pertinent Vitals/Pain Pain Assessment: 0-10 Pain Score: 8  Pain Location: Head, low back Pain Descriptors / Indicators: Aching Pain Intervention(s): Limited activity within patient's tolerance;Monitored during session    Young expects to be discharged to:: Private residence Living Arrangements: Spouse/significant other Available Help at Discharge: Family;Available PRN/intermittently Type of Home: House Home Access: Stairs to enter Entrance Stairs-Rails: Left Entrance Stairs-Number of Steps: 4 Home Layout: One level Home Equipment: Walker - 2 wheels;Other (comment) (3 wheeled walker for community)      Prior Function Level of Independence: Needs assistance   Gait / Transfers Assistance Needed: Patient modified independent with transfers and gait utilizing RW in house and 3 wheeled walker in community  ADL's / Homemaking Assistance Needed: Performed by husband        Hand Dominance        Extremity/Trunk Assessment   Upper Extremity Assessment: Overall WFL for tasks assessed           Lower Extremity Assessment: Overall WFL for tasks assessed         Communication   Communication: No difficulties  Cognition Arousal/Alertness: Awake/alert Behavior During Therapy: WFL for tasks assessed/performed Overall Cognitive Status: Within  Functional Limits for tasks assessed                      General Comments      Exercises        Assessment/Plan    PT Assessment Patent does not need any further PT services  PT Diagnosis Difficulty walking   PT Problem List    PT Treatment Interventions     PT Goals (Current goals can be found in the Care Plan section) Acute Rehab PT Goals Patient Stated Goal: "To go home" PT Goal Formulation: With patient/family Time For Goal Achievement:  09/16/15 Potential to Achieve Goals: Good    Frequency     Barriers to discharge        Co-evaluation               End of Session Equipment Utilized During Treatment: Gait belt;Oxygen Activity Tolerance: Patient tolerated treatment well Patient left: in bed;with call bell/phone within reach;with bed alarm set;with family/visitor present Nurse Communication: Mobility status         Time: [id number] PT Time Calculation (min) (ACUTE ONLY): 24 min   Charges:   PT Evaluation $PT Eval Low Complexity: 1 Procedure     PT G Codes:        Dorice Lamas, PT, DPT 09/02/2015, 1:44 PM

## 2015-09-02 NOTE — Consult Note (Signed)
Pharmacy Antibiotic Note  [name] is a 82 y.o. female admitted on 09/01/2015 with UTIand pneumonia.  Pharmacy has been consulted for levaquin dosing.  Plan: CrCl <29ml/min so Will continue Levofloxacin 250mg  PO daily for UTI and Pneumonia.   Height: 5\' 7"  (170.2 cm) Weight: 176 lb 12.8 oz (80.196 kg) IBW/kg (Calculated) : 61.6  Temp (24hrs), Avg:98 F (36.7 C), Min:97.8 F (36.6 C), Max:98.2 F (36.8 C)   Recent Labs Lab 09/01/15 1121 09/02/15 0449  WBC 12.6* 10.3  CREATININE 1.07* 0.88    Estimated Creatinine Clearance: 48.1 mL/min (by C-G formula based on Cr of 0.88).    Allergies  Allergen Reactions  . Ceftriaxone Swelling  . Cefuroxime Hives  . Cephalosporins Swelling and Other (See Comments)    Reaction:  Fainting/dry mouth   . Codeine Nausea And Vomiting  . Epinephrine Other (See Comments)    Reaction:  Fainting   . Morphine And Related Other (See Comments)    Reaction:  GI upset   . Buprenorphine Hcl Nausea And Vomiting  . Ciprofloxacin Itching, Swelling and Rash  . Latex Rash  . Lidocaine Rash  . Procaine Rash    Antimicrobials this admission: Anti-infectives    Start     Dose/Rate Route Frequency Ordered Stop   09/01/15 1545  Levofloxacin (LEVAQUIN) IVPB 250 mg  Status:  Discontinued     250 mg 50 mL/hr over 60 Minutes Intravenous Every 24 hours 09/01/15 1541 09/01/15 1543   09/01/15 1545  levofloxacin (LEVAQUIN) tablet 250 mg     250 mg Oral Daily 09/01/15 1543     09/01/15 1230  ciprofloxacin (CIPRO) IVPB 400 mg     400 mg 200 mL/hr over 60 Minutes Intravenous  Once 09/01/15 1215 09/01/15 1328      Microbiology results: Results for orders placed or performed during the hospital encounter of 09/01/15  Urine culture     Status: None (Preliminary result)   Collection Time: 09/01/15 11:28 AM  Result Value Ref Range Status   Specimen Description URINE, CATHETERIZED  Final   Special Requests Normal  Final   Culture   Final    >=100,000  COLONIES/mL ESCHERICHIA COLI SUSCEPTIBILITIES TO FOLLOW    Report Status PENDING  Incomplete  CULTURE, BLOOD (ROUTINE X 2) w Reflex to PCR ID Panel     Status: None (Preliminary result)   Collection Time: 09/01/15  6:18 PM  Result Value Ref Range Status   Specimen Description BLOOD RIGHT ASSIST CONTROL  Final   Special Requests   Final    BOTTLES DRAWN AEROBIC AND ANAEROBIC [place] AER 6CC ANA   Culture NO GROWTH < 12 HOURS  Final   Report Status PENDING  Incomplete  CULTURE, BLOOD (ROUTINE X 2) w Reflex to PCR ID Panel     Status: None (Preliminary result)   Collection Time: 09/01/15  6:42 PM  Result Value Ref Range Status   Specimen Description BLOOD LEFT HAND  Final   Special Requests BOTTLES DRAWN AEROBIC AND ANAEROBIC [place]  Final   Culture NO GROWTH < 12 HOURS  Final   Report Status PENDING  Incomplete    Thank you for allowing pharmacy to be a part of this patient's care.  Nancy Fetter, PharmD Pharmacy Resident 09/02/2015 10:48 AM

## 2015-09-02 NOTE — Progress Notes (Signed)
Nurse wanted pt to have a prn treatment for wheezing. Pt refused treatment after it was put on she did not want it. Nurse notified. Pt in no distress

## 2015-09-02 NOTE — Progress Notes (Signed)
Pt had good day today. Diuresing on lasix. Up to bsc with single assist. Up with p.t. ambulatedjust outside room. tol well. 02 reduced to 2 l. Pt sating 92%. Appetite fairly good. Family at bedside most of day. requring fsbs coverage each meal.

## 2015-09-03 ENCOUNTER — Inpatient Hospital Stay: Payer: Medicare Other

## 2015-09-03 LAB — GLUCOSE, CAPILLARY
GLUCOSE-CAPILLARY: 145 mg/dL — AB (ref 65–99)
Glucose-Capillary: 99 mg/dL (ref 65–99)

## 2015-09-03 LAB — URINE CULTURE: SPECIAL REQUESTS: NORMAL

## 2015-09-03 LAB — BASIC METABOLIC PANEL
ANION GAP: 8 (ref 5–15)
BUN: 31 mg/dL — AB (ref 6–20)
CALCIUM: 8.3 mg/dL — AB (ref 8.9–10.3)
CO2: 29 mmol/L (ref 22–32)
CREATININE: 1.07 mg/dL — AB (ref 0.44–1.00)
Chloride: 99 mmol/L — ABNORMAL LOW (ref 101–111)
GFR calc Af Amer: 52 mL/min — ABNORMAL LOW (ref >60)
GFR calc non Af Amer: 45 mL/min — ABNORMAL LOW (ref >60)
GLUCOSE: 118 mg/dL — AB (ref 65–99)
Potassium: 3.9 mmol/L (ref 3.5–5.1)
Sodium: 136 mmol/L (ref 135–145)

## 2015-09-03 MED ORDER — BUDESONIDE-FORMOTEROL FUMARATE 160-4.5 MCG/ACT IN AERO: 2.0000 | INHALATION_SPRAY | Freq: Two times a day (BID) | RESPIRATORY_TRACT | Status: DC

## 2015-09-03 MED ORDER — METHYLPREDNISOLONE SODIUM SUCC 125 MG IJ SOLR
60.0000 mg | Freq: Once | INTRAMUSCULAR | Status: AC
  Administered 2015-09-03: 60 mg via INTRAVENOUS
  Filled 2015-09-03: qty 2

## 2015-09-03 MED ORDER — LEVOFLOXACIN 250 MG PO TABS: 250.0000 mg | ORAL_TABLET | Freq: Every day | ORAL | Status: DC

## 2015-09-03 MED ORDER — FUROSEMIDE 40 MG PO TABS: 40.0000 mg | ORAL_TABLET | Freq: Two times a day (BID) | ORAL | Status: DC

## 2015-09-03 NOTE — Progress Notes (Signed)
Pt tobe discharged this afternoon. Home 02 set up. Pt and spouse familiarized with its operation. Iv and tele removed. Central tele aware. disch instructions given to pt to her understanding. disch via w.c. and wearing 02. Transport by husband.

## 2015-09-03 NOTE — Discharge Summary (Signed)
[facility] [name]    MR#:  [id number]  DATE OF BIRTH:  1925/01/24  DATE OF ADMISSION:  09/01/2015 ADMITTING PHYSICIAN: Epifanio Lesches, MD  DATE OF DISCHARGE: 09/03/2015  PRIMARY CARE PHYSICIAN: Tracie Harrier, MD    ADMISSION DIAGNOSIS:  Weakness [R53.1] UTI (lower urinary tract infection) [N39.0] Hypoxia [R09.02]  DISCHARGE DIAGNOSIS:  Active Problems:   Acute respiratory failure ([place])   SECONDARY DIAGNOSIS:   Past Medical History  Diagnosis Date  . CHF (congestive heart failure) ([place])   . Diabetes mellitus without complication ([place])   . Hypertension   . Chronic back pain   . Cancer ([place])     skin ca    HOSPITAL COURSE:   82y/o F with PMH of diastolic CHF, DM, chronic back pain, HTN admitted for weakness- noted to have UTI and pneumonia  #1 RML pneumonia- blood cultures are negative -On Levaquin for now. Currently requiring 2 L of oxygen.  Has had history of needing home oxygen prior and is in pulmonary rehabilitation currently. Sats are 87% on room air at rest, patient qualifies for home oxygen. She'll be discharged on 2 L home oxygen. - no history of COPD or smoking. Started on Symbicort to help her breathe. -Known history of pulmonary nodules that are being followed as an outpatient. Chest x-ray with no significant changes. Continue Levaquin for now.  #2 urine cultures growing Escherichia coli sensitive to fluoroquinolones. -Continue Levaquin that would cover her respiratory symptoms and also UTI.  #3 acute respiratory failure with hypoxia-secondary to CHF and pneumonia. Continue O2 support and management as described above.  #4 acute on chronic diastolic CHF exacerbation-continue O2 support. Continue her home dose of oral Lasix and metolazone twice a week. -On Coreg and losartan -No recent echo. Echocardiogram done and is pending at this time. will follow up with cardiologist as  outpatient  #5 diabetes mellitus-on glimepiride, Januvia   #6 chronic atrial fibrillation-rate controlled. Status post pacemaker and on eliquis for anticoagulation  #7 chronic back pain-continue when necessary pain medications. Also on chronically fentanyl patch.   Will be discharged home today with home health.   DISCHARGE CONDITIONS:   Stable  CONSULTS OBTAINED:   None  DRUG ALLERGIES:   Allergies  Allergen Reactions  . Ceftriaxone Swelling  . Cefuroxime Hives  . Cephalosporins Swelling and Other (See Comments)    Reaction:  Fainting/dry mouth   . Codeine Nausea And Vomiting  . Epinephrine Other (See Comments)    Reaction:  Fainting   . Morphine And Related Other (See Comments)    Reaction:  GI upset   . Buprenorphine Hcl Nausea And Vomiting  . Ciprofloxacin Itching, Swelling and Rash  . Latex Rash  . Lidocaine Rash  . Procaine Rash    DISCHARGE MEDICATIONS:   Current Discharge Medication List    START taking these medications   Details  budesonide-formoterol (SYMBICORT) 160-4.5 MCG/ACT inhaler Inhale 2 puffs into the lungs 2 (two) times daily. Qty: 1 Inhaler, Refills: 12    levofloxacin (LEVAQUIN) 250 MG tablet Take 1 tablet (250 mg total) by mouth daily. Qty: 5 tablet, Refills: 0      CONTINUE these medications which have NOT CHANGED   Details  apixaban (ELIQUIS) 5 MG TABS tablet Take 5 mg by mouth 2 (two) times daily.    carvedilol (COREG) 6.25 MG tablet Take 6.25 mg by mouth 2 (two) times daily with a meal.     cholecalciferol (  VITAMIN D) 1000 units tablet Take 1,000 Units by mouth daily.    docusate sodium (COLACE) 100 MG capsule Take 100 mg by mouth 2 (two) times daily.     escitalopram (LEXAPRO) 10 MG tablet Take 10 mg by mouth daily.    fentaNYL (DURAGESIC - DOSED MCG/HR) 50 MCG/HR Place 50 mcg onto the skin every other day.     ferrous sulfate 325 (65 FE) MG tablet Take 325 mg by mouth daily with breakfast.    !! furosemide (LASIX) 40 MG  tablet Take 40 mg by mouth every other day. Pt alternates with the 80mg  tablet.    !! furosemide (LASIX) 80 MG tablet Take 80 mg by mouth every other day. Pt alternates with the 40mg  tablet.    glimepiride (AMARYL) 4 MG tablet Take 4 mg by mouth 2 (two) times daily after a meal.    losartan (COZAAR) 25 MG tablet Take 25 mg by mouth daily.    magnesium oxide (MAG-OX) 400 MG tablet Take 400 mg by mouth daily.    Melatonin 1 MG TABS Take 1 mg by mouth at bedtime.    metolazone (ZAROXOLYN) 2.5 MG tablet Take 2.5 mg by mouth 2 (two) times a week. Pt takes on Monday and Friday.    oxyCODONE (OXY IR/ROXICODONE) 5 MG immediate release tablet Take 5-10 mg by mouth every 4 (four) hours as needed for severe pain.     pantoprazole (PROTONIX) 40 MG tablet Take 40 mg by mouth daily.    rOPINIRole (REQUIP) 0.5 MG tablet Take 0.5 mg by mouth 2 (two) times daily.    sitaGLIPtin (JANUVIA) 100 MG tablet Take 100 mg by mouth daily.    spironolactone (ALDACTONE) 25 MG tablet Take 25 mg by mouth daily.     vitamin B-12 (CYANOCOBALAMIN) 1000 MCG tablet Take 1,000 mcg by mouth daily.    vitamin C (ASCORBIC ACID) 500 MG tablet Take 500 mg by mouth daily.    Vitamin D, Ergocalciferol, (DRISDOL) 50000 UNITS CAPS capsule Take 50,000 Units by mouth every 7 (seven) days. Pt takes on Thursday.     !! - Potential duplicate medications found. Please discuss with provider.    STOP taking these medications     aspirin EC 81 MG tablet      clotrimazole (MYCELEX) 10 MG troche      sulfamethoxazole-trimethoprim (BACTRIM DS,SEPTRA DS) 800-160 MG tablet          DISCHARGE INSTRUCTIONS:   1. PCP f/u in 1-2 weeks 2. Home oxygen and home health  If you experience worsening of your admission symptoms, develop shortness of breath, life threatening emergency, suicidal or homicidal thoughts you must seek medical attention immediately by calling 911 or calling your MD immediately  if symptoms less severe.  You  Must read complete instructions/literature along with all the possible adverse reactions/side effects for all the Medicines you take and that have been prescribed to you. Take any new Medicines after you have completely understood and accept all the possible adverse reactions/side effects.   Please note  You were cared for by a hospitalist during your hospital stay. If you have any questions about your discharge medications or the care you received while you were in the hospital after you are discharged, you can call the unit and asked to speak with the hospitalist on call if the hospitalist that took care of you is not available. Once you are discharged, your primary care physician will handle any further medical issues. Please note that  NO REFILLS for any discharge medications will be authorized once you are discharged, as it is imperative that you return to your primary care physician (or establish a relationship with a primary care physician if you do not have one) for your aftercare needs so that they can reassess your need for medications and monitor your lab values.    Today   CHIEF COMPLAINT:   Chief Complaint  Patient presents with  . Headache     VITAL SIGNS:  Blood pressure 135/47, pulse 74, temperature 97.8 F (36.6 C), temperature source Oral, resp. rate 18, height 5\' 7"  (1.702 m), weight 80.196 kg (176 lb 12.8 oz), SpO2 87 %.  I/O:   Intake/Output Summary (Last 24 hours) at 09/03/15 1455 Last data filed at 09/03/15 1300  Gross per 24 hour  Intake    120 ml  Output   1650 ml  Net  -1530 ml    PHYSICAL EXAMINATION:   Physical Exam  GENERAL: 82 y.o.-year-old patient lying in the bed with no acute distress.  EYES: Pupils equal, round, reactive to light and accommodation. No scleral icterus. Extraocular muscles intact.  HEENT: Head atraumatic, normocephalic. Oropharynx and nasopharynx clear.  NECK: Supple, no jugular venous distention. No thyroid enlargement, no  tenderness.  LUNGS: Improved expiratory wheezes and rhonchi posteriorly. Scant breath sounds at right base. No rales or crepitation. No use of accessory muscles of respiration.  CARDIOVASCULAR: S1, S2 normal. No rubs, or gallops. 3/6 systolic murmur is present ABDOMEN: Soft, nontender, nondistended. Bowel sounds present. No organomegaly or mass.  EXTREMITIES: No cyanosis, or clubbing. 1+ bilateral pedal edema noted NEUROLOGIC: Cranial nerves II through XII are intact. Muscle strength 5/5 in all extremities. Sensation intact. Gait not checked.  PSYCHIATRIC: The patient is alert and oriented x 3.  SKIN: No obvious rash, lesion, or ulcer.   DATA REVIEW:   CBC  Recent Labs Lab 09/02/15 0449  WBC 10.3  HGB 9.2*  HCT 27.7*  PLT 147*    Chemistries   Recent Labs Lab 09/01/15 1121  09/03/15 0419  NA 136  < > 136  K 4.3  < > 3.9  CL 101  < > 99*  CO2 30  < > 29  GLUCOSE 225*  < > 118*  BUN 35*  < > 31*  CREATININE 1.07*  < > 1.07*  CALCIUM 8.4*  < > 8.3*  AST 16  --   --   ALT 11*  --   --   ALKPHOS 68  --   --   BILITOT 1.0  --   --   < > = values in this interval not displayed.  Cardiac Enzymes  Recent Labs Lab 09/01/15 1121  TROPONINI <0.03    Microbiology Results  Results for orders placed or performed during the hospital encounter of 09/01/15  Urine culture     Status: None   Collection Time: 09/01/15 11:28 AM  Result Value Ref Range Status   Specimen Description URINE, CATHETERIZED  Final   Special Requests Normal  Final   Culture >=100,000 COLONIES/mL ESCHERICHIA COLI  Final   Report Status 09/03/2015 FINAL  Final   Organism ID, Bacteria ESCHERICHIA COLI  Final      Susceptibility   Escherichia coli - MIC*    AMPICILLIN >=32 RESISTANT Resistant     CEFAZOLIN <=4 SENSITIVE Sensitive     CEFTRIAXONE <=1 SENSITIVE Sensitive     CIPROFLOXACIN <=0.25 SENSITIVE Sensitive     GENTAMICIN <=1 SENSITIVE Sensitive  IMIPENEM <=0.25 SENSITIVE Sensitive      NITROFURANTOIN <=16 SENSITIVE Sensitive     TRIMETH/SULFA >=320 RESISTANT Resistant     AMPICILLIN/SULBACTAM 4 SENSITIVE Sensitive     PIP/TAZO <=4 SENSITIVE Sensitive     Extended ESBL NEGATIVE Sensitive     * >=100,000 COLONIES/mL ESCHERICHIA COLI  CULTURE, BLOOD (ROUTINE X 2) w Reflex to PCR ID Panel     Status: None (Preliminary result)   Collection Time: 09/01/15  6:18 PM  Result Value Ref Range Status   Specimen Description BLOOD RIGHT ASSIST CONTROL  Final   Special Requests   Final    BOTTLES DRAWN AEROBIC AND ANAEROBIC [place] AER 6CC ANA   Culture NO GROWTH 2 DAYS  Final   Report Status PENDING  Incomplete  CULTURE, BLOOD (ROUTINE X 2) w Reflex to PCR ID Panel     Status: None (Preliminary result)   Collection Time: 09/01/15  6:42 PM  Result Value Ref Range Status   Specimen Description BLOOD LEFT HAND  Final   Special Requests BOTTLES DRAWN AEROBIC AND ANAEROBIC [place]  Final   Culture NO GROWTH 2 DAYS  Final   Report Status PENDING  Incomplete    RADIOLOGY:  Dg Chest 2 View  09/03/2015  CLINICAL DATA:  Follow-up CHF EXAM: CHEST  2 VIEW COMPARISON:  09/01/2015 FINDINGS: Cardiomegaly with mild interstitial edema. Possible small left pleural effusion. No pneumothorax. Left subclavian pacemaker. Lower thoracolumbar spine fixation hardware. IMPRESSION: Cardiomegaly mild interstitial edema and possible small left pleural effusion. Electronically Signed   By: Julian Hy M.D.   On: 09/03/2015 10:22    EKG:   Orders placed or performed during the hospital encounter of 09/01/15  . ED EKG  . ED EKG      Management plans discussed with the patient, family and they are in agreement.  CODE STATUS:     Code Status Orders        Start     Ordered   09/01/15 1531  Full code   Continuous     09/01/15 1533    Code Status History    Date Active Date Inactive Code Status Order ID Comments User Context   03/21/2015  6:26 AM 03/22/2015  7:39 PM Full Code [id number]  Harrie Foreman, MD Inpatient    Advance Directive Documentation        Most Recent Value   Type of Advance Directive  Living will, Healthcare Power of Attorney   Pre-existing out of facility DNR order (yellow form or pink MOST form)     "MOST" Form in Place?        TOTAL TIME TAKING CARE OF THIS PATIENT: 38 minutes.    Gladstone Lighter M.D on 09/03/2015 at 2:55 PM  Between 7am to 6pm - Pager - [phone]  After 6pm go to [email or web address] - password [email or web address]  Office  [phone]  CC: Primary care physician; Tracie Harrier, MD

## 2015-09-03 NOTE — Progress Notes (Signed)
Pt ambulated on room air. She desaturated to 84% with slight exertion of walking a few steps. Recovered readily with 02.

## 2015-09-03 NOTE — Care Management Note (Signed)
Case Management Note  Patient Details  [name] MRN: [id number] Date of Birth: Dec 02, 1924  Subjective/Objective:                    Action/Plan: Discussed discharge planning with [name] and her husband. They chose [facility] to be home health provider. A referral was called and faxed to [facility] requesting RN services.   Expected Discharge Date:                  Expected Discharge Plan:     In-House Referral:     Discharge planning Services     Post Acute Care Choice:    Choice offered to:     DME Arranged:    DME Agency:     HH Arranged:    [place] Agency:     Status of Service:     Medicare Important Message Given:    Date Medicare IM Given:    Medicare IM give by:    Date Additional Medicare IM Given:    Additional Medicare Important Message give by:     If discussed at [place] of Stay Meetings, dates discussed:    Additional Comments:  Emmalene Kattner A, RN 09/03/2015, 10:14 AM

## 2015-09-03 NOTE — Care Management Note (Signed)
Case Management Note  Patient Details  [name] MRN: [id number] Date of Birth: 10/21/1924  Subjective/Objective:         An order for new home oxygen was called and faxed to [facility] [place]. [facility] will deliver a portable oxygen tank to [name] in room 231 today, then set up a home oxygen in her home later this evening.            Action/Plan:   Expected Discharge Date:                  Expected Discharge Plan:     In-House Referral:     Discharge planning Services     Post Acute Care Choice:    Choice offered to:     DME Arranged:    DME Agency:     HH Arranged:    [place] Agency:     Status of Service:     Medicare Important Message Given:    Date Medicare IM Given:    Medicare IM give by:    Date Additional Medicare IM Given:    Additional Medicare Important Message give by:     If discussed at [place] of Stay Meetings, dates discussed:    Additional Comments:  Dung Prien A, RN 09/03/2015, 2:07 PM

## 2015-09-04 NOTE — Progress Notes (Signed)
[place]  [phone] Fax:([phone]) [phone]  ID: [name] OB: [birth year]  MR#: [id number]  [id number]  Patient Care Team: Tracie Harrier, MD as PCP - General (Internal Medicine) Alisa Graff, FNP as Nurse Practitioner (Family Medicine) Corey Skains, MD as Consulting Physician (Cardiology)  CHIEF COMPLAINT:  Chief Complaint  Patient presents with  . pulmonary nodule    INTERVAL HISTORY: Patient returns to clinic today for further evaluation and discussion of her CT scan results. She tripped and fell yesterday sustaining a concussion, but CT of the head was negative. She otherwise has felt well and is asymptomatic. She has no neurologic complaints. She denies any weight loss. She denies any recent fevers. She has no chest pain, shortness of breath, hemoptysis, or cough. She denies any nausea, vomiting, constipation, or diarrhea. She has no urinary complaints. Patient otherwise feels well and offers no further specific complaints.  REVIEW OF SYSTEMS:   Review of Systems  Constitutional: Negative for fever, chills, weight loss and malaise/fatigue.  Respiratory: Negative.  Negative for cough, hemoptysis and shortness of breath.   Cardiovascular: Negative.  Negative for chest pain.  Gastrointestinal: Negative.   Musculoskeletal: Negative.   Neurological: Positive for headaches. Negative for weakness.    As per HPI. Otherwise, a complete review of systems is negatve.  PAST MEDICAL HISTORY: Past Medical History  Diagnosis Date  . CHF (congestive heart failure) ([place])   . Diabetes mellitus without complication ([place])   . Hypertension   . Chronic back pain   . Cancer ([place])     skin ca    PAST SURGICAL HISTORY: Past Surgical History  Procedure Laterality Date  . Pacemaker placement    . Leg surgery    . Abdominal hysterectomy    . Tonsillectomy    . Back surgery      x3  . Coronary stent placement      unknown location per  pt  . Foot surgery      FAMILY HISTORY: Prostate cancer, rectal cancer, DVT, CAD, hypertension.     ADVANCED DIRECTIVES:    HEALTH MAINTENANCE: Social History  Substance Use Topics  . Smoking status: Never Smoker   . Smokeless tobacco: Never Used  . Alcohol Use: No     Colonoscopy:  PAP:  Bone density:  Lipid panel:  Allergies  Allergen Reactions  . Ceftriaxone Swelling  . Cefuroxime Hives  . Cephalosporins Swelling and Other (See Comments)    Reaction:  Fainting/dry mouth   . Codeine Nausea And Vomiting  . Epinephrine Other (See Comments)    Reaction:  Fainting   . Morphine And Related Other (See Comments)    Reaction:  GI upset   . Buprenorphine Hcl Nausea And Vomiting  . Ciprofloxacin Itching, Swelling and Rash  . Latex Rash  . Lidocaine Rash  . Procaine Rash    Current Outpatient Prescriptions  Medication Sig Dispense Refill  . carvedilol (COREG) 6.25 MG tablet Take 6.25 mg by mouth 2 (two) times daily with a meal.     . docusate sodium (COLACE) 100 MG capsule Take 100 mg by mouth 2 (two) times daily.     Marland Kitchen escitalopram (LEXAPRO) 10 MG tablet Take 10 mg by mouth daily.    . ferrous sulfate 325 (65 FE) MG tablet Take 325 mg by mouth daily with breakfast.    . furosemide (LASIX) 80 MG tablet Take 80 mg by mouth every other day. Pt alternates with the 40mg  tablet.    Marland Kitchen  glimepiride (AMARYL) 4 MG tablet Take 4 mg by mouth 2 (two) times daily after a meal.    . losartan (COZAAR) 25 MG tablet Take 25 mg by mouth daily.    . metolazone (ZAROXOLYN) 2.5 MG tablet Take 2.5 mg by mouth 2 (two) times a week. Pt takes on Monday and Friday.    . pantoprazole (PROTONIX) 40 MG tablet Take 40 mg by mouth daily.    Marland Kitchen rOPINIRole (REQUIP) 0.5 MG tablet Take 0.5 mg by mouth 2 (two) times daily.    Marland Kitchen spironolactone (ALDACTONE) 25 MG tablet Take 25 mg by mouth daily.     . vitamin C (ASCORBIC ACID) 500 MG tablet Take 500 mg by mouth daily.    . Vitamin D, Ergocalciferol, (DRISDOL)  50000 UNITS CAPS capsule Take 50,000 Units by mouth every 7 (seven) days. Pt takes on Thursday.    Marland Kitchen apixaban (ELIQUIS) 5 MG TABS tablet Take 5 mg by mouth 2 (two) times daily.    . budesonide-formoterol (SYMBICORT) 160-4.5 MCG/ACT inhaler Inhale 2 puffs into the lungs 2 (two) times daily. 1 Inhaler 12  . cholecalciferol (VITAMIN D) 1000 units tablet Take 1,000 Units by mouth daily.    . fentaNYL (DURAGESIC - DOSED MCG/HR) 50 MCG/HR Place 50 mcg onto the skin every other day.     . furosemide (LASIX) 40 MG tablet Take 40 mg by mouth every other day. Pt alternates with the 80mg  tablet.    Marland Kitchen levofloxacin (LEVAQUIN) 250 MG tablet Take 1 tablet (250 mg total) by mouth daily. X 5 days 5 tablet 0  . magnesium oxide (MAG-OX) 400 MG tablet Take 400 mg by mouth daily.    . Melatonin 1 MG TABS Take 1 mg by mouth at bedtime.    Marland Kitchen oxyCODONE (OXY IR/ROXICODONE) 5 MG immediate release tablet Take 5-10 mg by mouth every 4 (four) hours as needed for severe pain.     . sitaGLIPtin (JANUVIA) 100 MG tablet Take 100 mg by mouth daily.    . vitamin B-12 (CYANOCOBALAMIN) 1000 MCG tablet Take 1,000 mcg by mouth daily.     No current facility-administered medications for this visit.    OBJECTIVE: Filed Vitals:   08/29/15 1429  BP: 107/66  Pulse: 75  Temp: 96.4 F (35.8 C)  Resp: 16     Body mass index is 27.55 kg/(m^2).    ECOG FS:0 - Asymptomatic  General: Well-developed, well-nourished, no acute distress. Eyes: anicteric sclera. Lungs: Clear to auscultation bilaterally. Heart: Regular rate and rhythm. No rubs, murmurs, or gallops. Abdomen: Soft, nontender, nondistended. No organomegaly noted, normoactive bowel sounds. Musculoskeletal: No edema, cyanosis, or clubbing. Neuro: Alert, answering all questions appropriately. Cranial nerves grossly intact. Skin: No rashes or petechiae noted. Psych: Normal affect.    LAB RESULTS:  Lab Results  Component Value Date   NA 136 09/03/2015   K 3.9 09/03/2015     CL 99* 09/03/2015   CO2 29 09/03/2015   GLUCOSE 118* 09/03/2015   BUN 31* 09/03/2015   CREATININE 1.07* 09/03/2015   CALCIUM 8.3* 09/03/2015   PROT 6.4* 09/01/2015   ALBUMIN 3.2* 09/01/2015   AST 16 09/01/2015   ALT 11* 09/01/2015   ALKPHOS 68 09/01/2015   BILITOT 1.0 09/01/2015   GFRNONAA 45* 09/03/2015   GFRAA 52* 09/03/2015    Lab Results  Component Value Date   WBC 10.3 09/02/2015   NEUTROABS 11.3* 09/01/2015   HGB 9.2* 09/02/2015   HCT 27.7* 09/02/2015   MCV 89.4 09/02/2015  PLT 147* 09/02/2015     STUDIES: Dg Chest 1 View  09/01/2015  CLINICAL DATA:  Weakness, cough EXAM: CHEST 1 VIEW COMPARISON:  08/22/2015 FINDINGS: Cardiomegaly again noted. Dual lead cardiac pacemaker is unchanged in position. There is central mild vascular congestion and mild perihilar interstitial prominence without convincing pulmonary edema. Streaky atelectasis or early infiltrate in right middle lobe. Metallic fixation rods are noted lower thoracic and lumbar spine. IMPRESSION: Central mild vascular congestion and mild perihilar interstitial prominence without convincing pulmonary edema. Streaky atelectasis or infiltrate in right middle lobe. Dual lead cardiac pacemaker is unchanged in position. Electronically Signed   By: Lahoma Crocker M.D.   On: 09/01/2015 10:52   Dg Chest 2 View  09/03/2015  CLINICAL DATA:  Follow-up CHF EXAM: CHEST  2 VIEW COMPARISON:  09/01/2015 FINDINGS: Cardiomegaly with mild interstitial edema. Possible small left pleural effusion. No pneumothorax. Left subclavian pacemaker. Lower thoracolumbar spine fixation hardware. IMPRESSION: Cardiomegaly mild interstitial edema and possible small left pleural effusion. Electronically Signed   By: Julian Hy M.D.   On: 09/03/2015 10:22   Ct Head Wo Contrast  09/01/2015  CLINICAL DATA:  Headache and nausea. Hypoxia. [place] and hit head 4 days ago. EXAM: CT HEAD WITHOUT CONTRAST TECHNIQUE: Contiguous axial images were obtained from  the base of the skull through the vertex without intravenous contrast. COMPARISON:  CT head without contrast 08/28/2015. FINDINGS: A left occipital scalp hematoma in continues to resolve. There is no underlying fracture. Mild generalized atrophy and white matter disease is stable. No acute cortical infarct, hemorrhage, or mass lesion is present. The basal ganglia are within normal limits. The insular ribbon is intact bilaterally. No significant extra-axial fluid collection is present. The paranasal sinuses and mastoid air cells are clear. Atherosclerotic calcifications are again noted at the cavernous internal carotid arteries. The globes and orbits are intact. Senescent calcifications are present. IMPRESSION: 1. Resolving left occipital scalp hematoma without underlying fracture. 2. No acute intracranial abnormality. 3. Stable atrophy and white matter disease. Electronically Signed   By: San Morelle M.D.   On: 09/01/2015 11:19   Ct Head Wo Contrast  08/28/2015  CLINICAL DATA:  Patient fell and hit head. No reported loss of consciousness. EXAM: CT HEAD WITHOUT CONTRAST TECHNIQUE: Contiguous axial images were obtained from the base of the skull through the vertex without intravenous contrast. COMPARISON:  Brain CT 04/19/2013. FINDINGS: Ventricles and sulci are appropriate for patient's age. Periventricular and subcortical white matter hypodensity compatible with chronic microvascular ischemic changes. No evidence for acute cortically based infarct, intracranial hemorrhage, mass lesion or mass effect. Orbits are unremarkable. Soft tissue swelling and hematoma formation overlying the left posterior calvarium. IMPRESSION: No acute intracranial process. Soft tissue swelling about the left posterior calvarium without evidence for displaced underlying skull fracture. Electronically Signed   By: Lovey Newcomer M.D.   On: 08/28/2015 18:53   Ct Chest W Contrast  08/22/2015  CLINICAL DATA:  Pulmonary nodule. EXAM:  CT CHEST WITH CONTRAST TECHNIQUE: Multidetector CT imaging of the chest was performed during intravenous contrast administration. CONTRAST:  60mL OMNIPAQUE IOHEXOL 300 MG/ML  SOLN COMPARISON:  02/16/2015 FINDINGS: Mediastinum / Lymph Nodes: There is no axillary lymphadenopathy. High right paratracheal lymph node which was 9 mm in short axis on the previous study measures 8 mm short axis today. Precarinal lymph node measured at 15 mm short axis previously is now 14 mm. The subcarinal lymph node measured at 14 mm on the previous study is now 14 mm in  short axis. Small lymph nodes in both hilar regions are unchanged. The heart is enlarged. Coronary artery calcification is noted. No evidence of pericardial effusion. The esophagus has normal imaging features. Lungs / Pleura: As before, underlying chronic interstitial disease changes are evident with mosaic attenuation seen in both lungs, likely reflecting small airways disease although chronic pulmonary embolism could cause this appearance. There is diffuse bronchiectasis. In the interval since the prior study, the patient has developed multiple ill-defined pulmonary nodules in the right middle lobe, ranging in size from several mm up to 11 mm. Similarly, multiple ill-defined nodular opacities in the left lower lobe are new in the interval ranging in size from several mm up to 1 of the largest nodules seen in the paraspinal region on image 43, measuring 17 mm. The 19 mm focal airspace disease seen in the left lower lobe on the previous study has resolved in the interval. The new 4 mm left lower lobe pulmonary nodule identified on the previous study has resolved in the interval. Upper Abdomen:  Abdominal aortic calcification is evident. MSK / Soft Tissues: Fusion hardware is seen in the lower thoracic and upper lumbar spine, incompletely visualized with stable compression deformity at the T11 level. IMPRESSION: 1. Stable to slight decrease in mediastinal lymphadenopathy.  2. Underlying chronic interstitial changes, likely with associated chronic small airways disease. In the interval since the prior study, some ill-defined pulmonary nodules have resolved and other similar ill-defined pulmonary nodules have appeared. These changes are probably infectious/inflammatory in etiology but continued follow-up will be required. 3. Atherosclerosis. Electronically Signed   By: Misty Stanley M.D.   On: 08/22/2015 14:35    ASSESSMENT: Mediastinal lymphadenopathy and pulmonary nodule.  PLAN:    1. Mediastinal lymphadenopathy and new pulmonary nodule: CT scan results reviewed independently and reported as above. Mediastinal lymphadenopathy is stable to decreased. She has several ill-defined pulmonary nodules some of which resolved, others have appeared. These are possibly infectious or inflammatory in etiology. No further intervention is needed at this time. Can consider CT scan in 6 months if there is any additional concern. No further follow-up in the [place] is necessary. 2. Head injury: CT scan results reviewed independently with no obvious intracranial injury.  Patient expressed understanding and was in agreement with this plan. She also understands that She can call clinic at any time with any questions, concerns, or complaints.   Lloyd Huger, MD   09/04/2015 6:11 AM

## 2015-09-06 ENCOUNTER — Inpatient Hospital Stay
Admission: EM | Admit: 2015-09-06 | Discharge: 2015-09-13 | DRG: 193 | Disposition: A | Discharge status: Skilled Nursing Facility | Payer: Medicare Other | Attending: Internal Medicine | Admitting: Internal Medicine

## 2015-09-06 ENCOUNTER — Encounter: Payer: Self-pay | Admitting: *Deleted

## 2015-09-06 ENCOUNTER — Emergency Department: Payer: Medicare Other

## 2015-09-06 DIAGNOSIS — I5022 Chronic systolic (congestive) heart failure: Secondary | ICD-10-CM | POA: Diagnosis present

## 2015-09-06 DIAGNOSIS — J189 Pneumonia, unspecified organism: Principal | ICD-10-CM | POA: Diagnosis present

## 2015-09-06 DIAGNOSIS — Z8249 Family history of ischemic heart disease and other diseases of the circulatory system: Secondary | ICD-10-CM

## 2015-09-06 DIAGNOSIS — Z79899 Other long term (current) drug therapy: Secondary | ICD-10-CM

## 2015-09-06 DIAGNOSIS — Z950 Presence of cardiac pacemaker: Secondary | ICD-10-CM

## 2015-09-06 DIAGNOSIS — R0602 Shortness of breath: Secondary | ICD-10-CM

## 2015-09-06 DIAGNOSIS — M549 Dorsalgia, unspecified: Secondary | ICD-10-CM | POA: Diagnosis present

## 2015-09-06 DIAGNOSIS — E119 Type 2 diabetes mellitus without complications: Secondary | ICD-10-CM | POA: Diagnosis present

## 2015-09-06 DIAGNOSIS — I110 Hypertensive heart disease with heart failure: Secondary | ICD-10-CM | POA: Diagnosis present

## 2015-09-06 DIAGNOSIS — Z885 Allergy status to narcotic agent status: Secondary | ICD-10-CM

## 2015-09-06 DIAGNOSIS — K439 Ventral hernia without obstruction or gangrene: Secondary | ICD-10-CM | POA: Insufficient documentation

## 2015-09-06 DIAGNOSIS — Z955 Presence of coronary angioplasty implant and graft: Secondary | ICD-10-CM

## 2015-09-06 DIAGNOSIS — E860 Dehydration: Secondary | ICD-10-CM | POA: Diagnosis present

## 2015-09-06 DIAGNOSIS — Z7984 Long term (current) use of oral hypoglycemic drugs: Secondary | ICD-10-CM

## 2015-09-06 DIAGNOSIS — J9601 Acute respiratory failure with hypoxia: Secondary | ICD-10-CM | POA: Diagnosis present

## 2015-09-06 DIAGNOSIS — Z91040 Latex allergy status: Secondary | ICD-10-CM

## 2015-09-06 DIAGNOSIS — Z888 Allergy status to other drugs, medicaments and biological substances status: Secondary | ICD-10-CM

## 2015-09-06 DIAGNOSIS — Z7901 Long term (current) use of anticoagulants: Secondary | ICD-10-CM

## 2015-09-06 DIAGNOSIS — Z833 Family history of diabetes mellitus: Secondary | ICD-10-CM

## 2015-09-06 DIAGNOSIS — E871 Hypo-osmolality and hyponatremia: Secondary | ICD-10-CM | POA: Diagnosis present

## 2015-09-06 DIAGNOSIS — Z981 Arthrodesis status: Secondary | ICD-10-CM

## 2015-09-06 DIAGNOSIS — Z881 Allergy status to other antibiotic agents status: Secondary | ICD-10-CM

## 2015-09-06 DIAGNOSIS — Z9981 Dependence on supplemental oxygen: Secondary | ICD-10-CM

## 2015-09-06 DIAGNOSIS — I482 Chronic atrial fibrillation: Secondary | ICD-10-CM | POA: Diagnosis present

## 2015-09-06 DIAGNOSIS — Y95 Nosocomial condition: Secondary | ICD-10-CM | POA: Diagnosis present

## 2015-09-06 DIAGNOSIS — G8929 Other chronic pain: Secondary | ICD-10-CM | POA: Diagnosis present

## 2015-09-06 DIAGNOSIS — Z7951 Long term (current) use of inhaled steroids: Secondary | ICD-10-CM

## 2015-09-06 DIAGNOSIS — Z823 Family history of stroke: Secondary | ICD-10-CM

## 2015-09-06 LAB — CBC WITH DIFFERENTIAL/PLATELET
BASOS ABS: 0.1 10*3/uL (ref 0–0.1)
BASOS PCT: 1 %
EOS ABS: 0 10*3/uL (ref 0–0.7)
EOS PCT: 0 %
HEMATOCRIT: 31.1 % — AB (ref 35.0–47.0)
Hemoglobin: 10.1 g/dL — ABNORMAL LOW (ref 12.0–16.0)
Lymphocytes Relative: 4 %
Lymphs Abs: 0.6 10*3/uL — ABNORMAL LOW (ref 1.0–3.6)
MCH: 29 pg (ref 26.0–34.0)
MCHC: 32.7 g/dL (ref 32.0–36.0)
MCV: 88.9 fL (ref 80.0–100.0)
MONO ABS: 1.2 10*3/uL — AB (ref 0.2–0.9)
Monocytes Relative: 8 %
NEUTROS ABS: 12.8 10*3/uL — AB (ref 1.4–6.5)
Neutrophils Relative %: 87 %
PLATELETS: 194 10*3/uL (ref 150–440)
RBC: 3.49 MIL/uL — ABNORMAL LOW (ref 3.80–5.20)
RDW: 15.3 % — AB (ref 11.5–14.5)
WBC: 14.7 10*3/uL — ABNORMAL HIGH (ref 3.6–11.0)

## 2015-09-06 LAB — CULTURE, BLOOD (ROUTINE X 2)
CULTURE: NO GROWTH
CULTURE: NO GROWTH

## 2015-09-06 LAB — URINALYSIS COMPLETE WITH MICROSCOPIC (ARMC ONLY)
BILIRUBIN URINE: NEGATIVE
Bacteria, UA: NONE SEEN
GLUCOSE, UA: NEGATIVE mg/dL
Hgb urine dipstick: NEGATIVE
Ketones, ur: NEGATIVE mg/dL
LEUKOCYTES UA: NEGATIVE
NITRITE: NEGATIVE
Protein, ur: NEGATIVE mg/dL
Specific Gravity, Urine: 1.018 (ref 1.005–1.030)
Squamous Epithelial / LPF: NONE SEEN
pH: 5 (ref 5.0–8.0)

## 2015-09-06 MED ORDER — DEXTROSE 5 % IV SOLN
500.0000 mg | Freq: Once | INTRAVENOUS | Status: AC
  Administered 2015-09-06: 500 mg via INTRAVENOUS
  Filled 2015-09-06: qty 500

## 2015-09-06 NOTE — ED Notes (Signed)
Pt to ED from home via EMS with weakness, cough and congestion x 3 days. Pt with recent hx of pneumonia, treated with unknown antibiotics. Pt given 500 cc bolus of NS en route by EMS. Upon arrival, pt drowsy but responsive to verbal stimuli. Sating 84% on RA, placed on 3L [place] sating 97% at this time. Pt with congested cough noted, warm to touch, but afebrile. Vitals stable. MD Owens Shark at bedside at this time.

## 2015-09-06 NOTE — ED Provider Notes (Signed)
[facility] Emergency Department Provider Note  ____________________________________________  Time seen: 11:00 PM  I have reviewed the triage vital signs and the nursing notes.   HISTORY  Chief Complaint Weakness and Cough      HPI [name] is a 82 y.o. female presents via [facility] for cough congestion subjective fevers 3 days. Patient states that she was recently treated for pneumonia with an unknown antibiotic. Patient states that she saw Dr. Hollice Gong today where a flu swab was done as well as chest x-ray however she does not know the results of either. Patient presents to emergency department with an oxygen saturation of 84% on room air with apparent increased work of breathing. In addition I was notified by EMS that the patient had decreased urine output today per the patient's caregiver.     Past Medical History  Diagnosis Date  . CHF (congestive heart failure) ([place])   . Diabetes mellitus without complication ([place])   . Hypertension   . Chronic back pain   . Cancer [facility])     skin ca    Patient Active Problem List   Diagnosis Date Noted  . Acute respiratory failure ([place]) 09/01/2015  . Chronic diastolic heart failure ([place]) 04/10/2015  . Atrial fibrillation ([place]) 04/10/2015  . Essential hypertension 04/10/2015  . Diabetes ([place]) 04/10/2015  . Back pain 04/10/2015  . Acute on chronic systolic CHF (congestive heart failure) ([place]) 03/21/2015  . Pulmonary nodule 11/10/2014    Past Surgical History  Procedure Laterality Date  . Pacemaker placement    . Leg surgery    . Abdominal hysterectomy    . Tonsillectomy    . Back surgery      x3  . Coronary stent placement      unknown location per pt  . Foot surgery      Current Outpatient Rx  Name  Route  Sig  Dispense  Refill  . apixaban (ELIQUIS) 5 MG TABS tablet   Oral   Take 5 mg by mouth 2 (two) times daily.         . budesonide-formoterol (SYMBICORT) 160-4.5 MCG/ACT  inhaler   Inhalation   Inhale 2 puffs into the lungs 2 (two) times daily.   1 Inhaler   12   . carvedilol (COREG) 6.25 MG tablet   Oral   Take 6.25 mg by mouth 2 (two) times daily with a meal.          . cholecalciferol (VITAMIN D) 1000 units tablet   Oral   Take 1,000 Units by mouth daily.         Marland Kitchen docusate sodium (COLACE) 100 MG capsule   Oral   Take 100 mg by mouth 2 (two) times daily.          Marland Kitchen escitalopram (LEXAPRO) 10 MG tablet   Oral   Take 10 mg by mouth daily.         . fentaNYL (DURAGESIC - DOSED MCG/HR) 50 MCG/HR   Transdermal   Place 50 mcg onto the skin every other day.          . ferrous sulfate 325 (65 FE) MG tablet   Oral   Take 325 mg by mouth daily with breakfast.         . furosemide (LASIX) 40 MG tablet   Oral   Take 40 mg by mouth every other day. Pt alternates with the 80mg  tablet.         Marland Kitchen  furosemide (LASIX) 80 MG tablet   Oral   Take 80 mg by mouth every other day. Pt alternates with the 40mg  tablet.         Marland Kitchen glimepiride (AMARYL) 4 MG tablet   Oral   Take 4 mg by mouth 2 (two) times daily after a meal.         . levofloxacin (LEVAQUIN) 250 MG tablet   Oral   Take 1 tablet (250 mg total) by mouth daily. X 5 days   5 tablet   0   . losartan (COZAAR) 25 MG tablet   Oral   Take 25 mg by mouth daily.         . magnesium oxide (MAG-OX) 400 MG tablet   Oral   Take 400 mg by mouth daily.         . Melatonin 1 MG TABS   Oral   Take 1 mg by mouth at bedtime.         . metolazone (ZAROXOLYN) 2.5 MG tablet   Oral   Take 2.5 mg by mouth 2 (two) times a week. Pt takes on Monday and Friday.         Marland Kitchen oxyCODONE (OXY IR/ROXICODONE) 5 MG immediate release tablet   Oral   Take 5-10 mg by mouth every 4 (four) hours as needed for severe pain.          . pantoprazole (PROTONIX) 40 MG tablet   Oral   Take 40 mg by mouth daily.         Marland Kitchen rOPINIRole (REQUIP) 0.5 MG tablet   Oral   Take 0.5 mg by mouth 2 (two)  times daily.         . sitaGLIPtin (JANUVIA) 100 MG tablet   Oral   Take 100 mg by mouth daily.         Marland Kitchen spironolactone (ALDACTONE) 25 MG tablet   Oral   Take 25 mg by mouth daily.          . vitamin B-12 (CYANOCOBALAMIN) 1000 MCG tablet   Oral   Take 1,000 mcg by mouth daily.         . vitamin C (ASCORBIC ACID) 500 MG tablet   Oral   Take 500 mg by mouth daily.         . Vitamin D, Ergocalciferol, (DRISDOL) 50000 UNITS CAPS capsule   Oral   Take 50,000 Units by mouth every 7 (seven) days. Pt takes on Thursday.           Allergies Ceftriaxone; Cefuroxime; Cephalosporins; Codeine; Epinephrine; Morphine and related; Buprenorphine hcl; Ciprofloxacin; Latex; Lidocaine; and Procaine  Family History  Problem Relation Age of Onset  . CVA Mother   . Diabetes Mellitus II Sister   . Heart disease Father     Social History Social History  Substance Use Topics  . Smoking status: Never Smoker   . Smokeless tobacco: Never Used  . Alcohol Use: No    Review of Systems  Constitutional:positiveor fever. Eyes: Negative for visual changes. ENT: Negative for sore throat. Cardiovascular: Negative for chest pain. Respiratory: Negative for shortness of breath. positive for cough Gastrointestinal: Negative for abdominal pain, vomiting and diarrhea. Genitourinary: Negative for dysuria. Musculoskeletal: Negative for back pain. Skin: Negative for rash. Neurological: Negative for headaches, focal weakness or numbness.   10-point ROS otherwise negative.  ____________________________________________   PHYSICAL EXAM:  VITAL SIGNS: ED Triage Vitals  Enc Vitals Group     BP 09/06/15  2312 132/56 mmHg     Pulse Rate 09/06/15 2312 67     Resp 09/06/15 2312 16     Temp 09/06/15 2312 98.2 F (36.8 C)     Temp Source 09/06/15 2312 Oral     SpO2 09/06/15 2312 85 %     Weight 09/06/15 2312 180 lb (81.647 kg)     Height 09/06/15 2312 5\' 5"  (1.651 m)     Head Cir --       Peak Flow --      Pain Score 09/06/15 2309 0     Pain Loc --      Pain Edu? --      Excl. in [place]? --     Constitutional: Alert and oriented. Well appearing and in no distress. Eyes: Conjunctivae are normal. PERRL. Normal extraocular movements. ENT   Head: Normocephalic and atraumatic.   Nose: No congestion/rhinnorhea.   Mouth/Throat: Mucous membranes are moist.   Neck: No stridor. Hematological/Lymphatic/Immunilogical: No cervical lymphadenopathy. Cardiovascular: Normal rate, regular rhythm. Normal and symmetric distal pulses are present in all extremities. No murmurs, rubs, or gallops. Respiratory: Normal respiratory effort without tachypnea nor retractions. Breath sounds are clear and equal bilaterally. No wheezes/rales/rhonchi. Gastrointestinal: Soft and nontender. No distention. There is no CVA tenderness. Genitourinary: deferred Musculoskeletal: Nontender with normal range of motion in all extremities. No joint effusions.  No lower extremity tenderness nor edema. Neurologic:  Normal speech and language. No gross focal neurologic deficits are appreciated. Speech is normal.  Skin:  Skin is warm, dry and intact. No rash noted. Psychiatric: Mood and affect are normal. Speech and behavior are normal. Patient exhibits appropriate insight and judgment.  ____________________________________________    LABS (pertinent positives/negatives)  Labs Reviewed  COMPREHENSIVE METABOLIC PANEL - Abnormal; Notable for the following:    Sodium 130 (*)    Chloride 93 (*)    Glucose, Bld 184 (*)    BUN 38 (*)    Creatinine, Ser 1.04 (*)    Calcium 8.2 (*)    Albumin 3.4 (*)    ALT 12 (*)    GFR calc non Af Amer 47 (*)    GFR calc Af Amer 54 (*)    All other components within normal limits  CBC WITH DIFFERENTIAL/PLATELET - Abnormal; Notable for the following:    WBC 14.7 (*)    RBC 3.49 (*)    Hemoglobin 10.1 (*)    HCT 31.1 (*)    RDW 15.3 (*)    Neutro Abs 12.8 (*)     Lymphs Abs 0.6 (*)    Monocytes Absolute 1.2 (*)    All other components within normal limits  BLOOD GAS, VENOUS - Abnormal; Notable for the following:    pO2, Ven <31.0 (*)    Bicarbonate 34.4 (*)    Acid-Base Excess 8.6 (*)    All other components within normal limits  URINALYSIS COMPLETEWITH MICROSCOPIC ([facility] ONLY) - Abnormal; Notable for the following:    Color, Urine YELLOW (*)    APPearance CLEAR (*)    All other components within normal limits  RAPID INFLUENZA A&B ANTIGENS ([facility] ONLY)  CULTURE, BLOOD (ROUTINE X 2)  CULTURE, BLOOD (ROUTINE X 2)  URINE CULTURE    ____________________________________________   EKG  ED ECG REPORT I, Upton N BROWN, the attending physician, personally viewed and interpreted this ECG.   Date: 09/07/2015  EKG Time: 11:22 PM  Rate: 70  Rhythm: Ventricle paced rhythm  Axis: Left axis deviation  Intervals: Normal  ST&T Change: None   ____________________________________________    RADIOLOGY     CT Angio Chest PE W/Cm &/Or Wo Cm (Final result) Result time: 09/07/15 00:53:47   Final result by Rad Results In Interface (09/07/15 00:53:47)   Narrative:   CLINICAL DATA: Acute onset of generalized weakness, cough and congestion. Initial encounter.  EXAM: CT ANGIOGRAPHY CHEST WITH CONTRAST  TECHNIQUE: Multidetector CT imaging of the chest was performed using the standard protocol during bolus administration of intravenous contrast. Multiplanar CT image reconstructions and MIPs were obtained to evaluate the vascular anatomy.  CONTRAST: 32mL OMNIPAQUE IOHEXOL 350 MG/ML SOLN  COMPARISON: Chest radiograph performed 09/06/2015, and CT of the chest performed 08/22/2015  FINDINGS: There is no evidence of pulmonary embolus.  Patchy bilateral central airspace opacities are noted, more prominent on the right, concerning for multifocal pneumonia. Underlying chronic interstitial prominence is similar to the prior study.  Additional peripheral opacities are seen at the lung bases. There is no evidence of pleural effusion or pneumothorax. No masses are identified; no abnormal focal contrast enhancement is seen. Previously noted right middle lobe nodules have resolved.  Enlarged mediastinal nodes are seen. This includes a large subcarinal node measuring 2.3 cm in short axis, right paratracheal nodes measuring up to 2.0 cm in short axis, and aortopulmonary window nodes measuring up to 1.4 cm in short axis. Scattered bilateral hilar nodes are borderline normal in size.  Diffuse coronary artery calcifications are seen. Biatrial enlargement is noted. A pacemaker is noted at the left chest wall, with leads ending at the right atrium and right ventricle. Scattered calcification is noted along the thoracic aorta. No axillary lymphadenopathy is seen. The thyroid gland is unremarkable in appearance.  There is reflux of contrast into the IVC and hepatic veins. The visualized portions of the spleen are unremarkable.  No acute osseous abnormalities are seen. Thoracolumbar spinal fusion hardware is partially imaged.  Review of the MIP images confirms the above findings.  IMPRESSION: 1. No evidence of pulmonary embolus. 2. Patchy bilateral central airspace opacities, more prominent on the right, concerning for multifocal pneumonia. 3. Underlying chronic interstitial prominence is similar to the prior study. 4. Enlarged mediastinal nodes are significantly increased in size from the prior study, measuring up to 2.3 cm in short axis. Would correlate with the patient's history of malignancy. Lymphoma could have such an appearance. 5. Diffuse coronary artery calcifications seen. Biatrial enlargement noted. 6. Reflux of contrast into the IVC and hepatic veins.   Electronically Signed By: Garald Balding M.D. On: 09/07/2015 00:53          DG Chest Portable 1 View (Final result) Result time: 09/07/15  00:13:43   Final result by Rad Results In Interface (09/07/15 00:13:43)   Narrative:   CLINICAL DATA: Acute onset of generalized weakness, cough and congestion. Initial encounter.  EXAM: PORTABLE CHEST 1 VIEW  COMPARISON: Chest radiograph from 09/03/2015  FINDINGS: The lungs are well-aerated. Vascular congestion is noted, with mildly increased interstitial markings, raising concern for mild interstitial edema. There is no evidence of focal opacification, pleural effusion or pneumothorax.  The cardiomediastinal silhouette is mildly enlarged. A pacemaker is noted overlying the left chest wall, with leads ending overlying the right atrium and right ventricle. No acute osseous abnormalities are seen. Thoracolumbar spinal fusion hardware is partially imaged.  IMPRESSION: Vascular congestion and mild cardiomegaly, with mildly increased interstitial markings, raising concern for mild interstitial edema.   Electronically Signed By: Garald Balding M.D. On: 09/07/2015 00:13  INITIAL IMPRESSION / ASSESSMENT AND PLAN / ED COURSE  Pertinent labs & imaging results that were available during my care of the patient were reviewed by me and considered in my medical decision making (see chart for details).  Patient received vancomycin and Zosyn.  ____________________________________________   FINAL CLINICAL IMPRESSION(S) / ED DIAGNOSES  Final diagnoses:  CAP (community acquired pneumonia)      Gregor Hams, MD 09/07/15 [phone]

## 2015-09-07 ENCOUNTER — Encounter: Payer: Self-pay | Admitting: Radiology

## 2015-09-07 ENCOUNTER — Emergency Department: Payer: Medicare Other

## 2015-09-07 DIAGNOSIS — J189 Pneumonia, unspecified organism: Secondary | ICD-10-CM | POA: Diagnosis present

## 2015-09-07 DIAGNOSIS — G8929 Other chronic pain: Secondary | ICD-10-CM | POA: Diagnosis present

## 2015-09-07 DIAGNOSIS — Z79899 Other long term (current) drug therapy: Secondary | ICD-10-CM | POA: Diagnosis not present

## 2015-09-07 DIAGNOSIS — J9601 Acute respiratory failure with hypoxia: Secondary | ICD-10-CM | POA: Diagnosis present

## 2015-09-07 DIAGNOSIS — E871 Hypo-osmolality and hyponatremia: Secondary | ICD-10-CM | POA: Diagnosis present

## 2015-09-07 DIAGNOSIS — E860 Dehydration: Secondary | ICD-10-CM | POA: Diagnosis present

## 2015-09-07 DIAGNOSIS — Z7951 Long term (current) use of inhaled steroids: Secondary | ICD-10-CM | POA: Diagnosis not present

## 2015-09-07 DIAGNOSIS — Z7901 Long term (current) use of anticoagulants: Secondary | ICD-10-CM | POA: Diagnosis not present

## 2015-09-07 DIAGNOSIS — Z955 Presence of coronary angioplasty implant and graft: Secondary | ICD-10-CM | POA: Diagnosis not present

## 2015-09-07 DIAGNOSIS — E119 Type 2 diabetes mellitus without complications: Secondary | ICD-10-CM | POA: Diagnosis present

## 2015-09-07 DIAGNOSIS — Z833 Family history of diabetes mellitus: Secondary | ICD-10-CM | POA: Diagnosis not present

## 2015-09-07 DIAGNOSIS — Z950 Presence of cardiac pacemaker: Secondary | ICD-10-CM | POA: Diagnosis not present

## 2015-09-07 DIAGNOSIS — Z981 Arthrodesis status: Secondary | ICD-10-CM | POA: Diagnosis not present

## 2015-09-07 DIAGNOSIS — I482 Chronic atrial fibrillation: Secondary | ICD-10-CM | POA: Diagnosis present

## 2015-09-07 DIAGNOSIS — Z885 Allergy status to narcotic agent status: Secondary | ICD-10-CM | POA: Diagnosis not present

## 2015-09-07 DIAGNOSIS — Z91040 Latex allergy status: Secondary | ICD-10-CM | POA: Diagnosis not present

## 2015-09-07 DIAGNOSIS — I110 Hypertensive heart disease with heart failure: Secondary | ICD-10-CM | POA: Diagnosis present

## 2015-09-07 DIAGNOSIS — Z881 Allergy status to other antibiotic agents status: Secondary | ICD-10-CM | POA: Diagnosis not present

## 2015-09-07 DIAGNOSIS — Z888 Allergy status to other drugs, medicaments and biological substances status: Secondary | ICD-10-CM | POA: Diagnosis not present

## 2015-09-07 DIAGNOSIS — M549 Dorsalgia, unspecified: Secondary | ICD-10-CM | POA: Diagnosis present

## 2015-09-07 DIAGNOSIS — Y95 Nosocomial condition: Secondary | ICD-10-CM | POA: Diagnosis present

## 2015-09-07 DIAGNOSIS — Z8249 Family history of ischemic heart disease and other diseases of the circulatory system: Secondary | ICD-10-CM | POA: Diagnosis not present

## 2015-09-07 DIAGNOSIS — Z9981 Dependence on supplemental oxygen: Secondary | ICD-10-CM | POA: Diagnosis not present

## 2015-09-07 DIAGNOSIS — Z7984 Long term (current) use of oral hypoglycemic drugs: Secondary | ICD-10-CM | POA: Diagnosis not present

## 2015-09-07 DIAGNOSIS — Z823 Family history of stroke: Secondary | ICD-10-CM | POA: Diagnosis not present

## 2015-09-07 DIAGNOSIS — I5022 Chronic systolic (congestive) heart failure: Secondary | ICD-10-CM | POA: Diagnosis present

## 2015-09-07 DIAGNOSIS — K439 Ventral hernia without obstruction or gangrene: Secondary | ICD-10-CM | POA: Diagnosis not present

## 2015-09-07 LAB — GLUCOSE, CAPILLARY
GLUCOSE-CAPILLARY: 166 mg/dL — AB (ref 65–99)
GLUCOSE-CAPILLARY: 182 mg/dL — AB (ref 65–99)
GLUCOSE-CAPILLARY: 130 mg/dL — AB (ref 65–99)
Glucose-Capillary: 164 mg/dL — ABNORMAL HIGH (ref 65–99)
Glucose-Capillary: 94 mg/dL (ref 65–99)

## 2015-09-07 LAB — COMPREHENSIVE METABOLIC PANEL
ALBUMIN: 3.4 g/dL — AB (ref 3.5–5.0)
ALT: 12 U/L — AB (ref 14–54)
AST: 21 U/L (ref 15–41)
Alkaline Phosphatase: 71 U/L (ref 38–126)
Anion gap: 9 (ref 5–15)
BUN: 38 mg/dL — AB (ref 6–20)
CHLORIDE: 93 mmol/L — AB (ref 101–111)
CO2: 28 mmol/L (ref 22–32)
CREATININE: 1.04 mg/dL — AB (ref 0.44–1.00)
Calcium: 8.2 mg/dL — ABNORMAL LOW (ref 8.9–10.3)
GFR calc Af Amer: 54 mL/min — ABNORMAL LOW (ref >60)
GFR, EST NON AFRICAN AMERICAN: 47 mL/min — AB (ref >60)
GLUCOSE: 184 mg/dL — AB (ref 65–99)
Potassium: 4.3 mmol/L (ref 3.5–5.1)
Sodium: 130 mmol/L — ABNORMAL LOW (ref 135–145)
Total Bilirubin: 0.7 mg/dL (ref 0.3–1.2)
Total Protein: 7 g/dL (ref 6.5–8.1)

## 2015-09-07 LAB — BASIC METABOLIC PANEL
Anion gap: 7 (ref 5–15)
BUN: 34 mg/dL — AB (ref 6–20)
CHLORIDE: 95 mmol/L — AB (ref 101–111)
CO2: 31 mmol/L (ref 22–32)
CREATININE: 1.09 mg/dL — AB (ref 0.44–1.00)
Calcium: 7.9 mg/dL — ABNORMAL LOW (ref 8.9–10.3)
GFR calc Af Amer: 51 mL/min — ABNORMAL LOW (ref >60)
GFR calc non Af Amer: 44 mL/min — ABNORMAL LOW (ref >60)
GLUCOSE: 172 mg/dL — AB (ref 65–99)
Potassium: 3.8 mmol/L (ref 3.5–5.1)
SODIUM: 133 mmol/L — AB (ref 135–145)

## 2015-09-07 LAB — CBC
HCT: 28.9 % — ABNORMAL LOW (ref 35.0–47.0)
Hemoglobin: 9.4 g/dL — ABNORMAL LOW (ref 12.0–16.0)
MCH: 29.5 pg (ref 26.0–34.0)
MCHC: 32.6 g/dL (ref 32.0–36.0)
MCV: 90.5 fL (ref 80.0–100.0)
PLATELETS: 168 10*3/uL (ref 150–440)
RBC: 3.2 MIL/uL — ABNORMAL LOW (ref 3.80–5.20)
RDW: 15.9 % — AB (ref 11.5–14.5)
WBC: 11.4 10*3/uL — AB (ref 3.6–11.0)

## 2015-09-07 LAB — LACTIC ACID, PLASMA
Lactic Acid, Venous: 1.3 mmol/L (ref 0.5–2.0)
Lactic Acid, Venous: 1 mmol/L (ref 0.5–2.0)

## 2015-09-07 LAB — RAPID INFLUENZA A&B ANTIGENS: Influenza A (ARMC): NEGATIVE

## 2015-09-07 LAB — MRSA PCR SCREENING: MRSA by PCR: NEGATIVE

## 2015-09-07 LAB — RAPID INFLUENZA A&B ANTIGENS (ARMC ONLY): INFLUENZA B (ARMC): NEGATIVE

## 2015-09-07 MED ORDER — SPIRONOLACTONE 25 MG PO TABS
25.0000 mg | ORAL_TABLET | Freq: Every day | ORAL | Status: DC
  Administered 2015-09-07 – 2015-09-13 (×7): 25 mg via ORAL
  Filled 2015-09-07 (×7): qty 1

## 2015-09-07 MED ORDER — DOCUSATE SODIUM 100 MG PO CAPS
100.0000 mg | ORAL_CAPSULE | Freq: Two times a day (BID) | ORAL | Status: DC
  Administered 2015-09-07 – 2015-09-13 (×13): 100 mg via ORAL
  Filled 2015-09-07 (×13): qty 1

## 2015-09-07 MED ORDER — VITAMIN C 500 MG PO TABS
500.0000 mg | ORAL_TABLET | Freq: Every day | ORAL | Status: DC
  Administered 2015-09-07 – 2015-09-13 (×7): 500 mg via ORAL
  Filled 2015-09-07 (×7): qty 1

## 2015-09-07 MED ORDER — ROPINIROLE HCL 1 MG PO TABS
0.5000 mg | ORAL_TABLET | Freq: Two times a day (BID) | ORAL | Status: DC
  Administered 2015-09-07 (×2): 0.5 mg via ORAL
  Administered 2015-09-08: 1 mg via ORAL
  Administered 2015-09-08 – 2015-09-09 (×2): 0.5 mg via ORAL
  Administered 2015-09-09: 22:00:00 via ORAL
  Administered 2015-09-10 – 2015-09-13 (×7): 0.5 mg via ORAL
  Filled 2015-09-07 (×13): qty 1

## 2015-09-07 MED ORDER — SODIUM CHLORIDE 0.9 % IV SOLN
INTRAVENOUS | Status: DC
  Administered 2015-09-07 (×2): via INTRAVENOUS

## 2015-09-07 MED ORDER — FUROSEMIDE 40 MG PO TABS
40.0000 mg | ORAL_TABLET | ORAL | Status: DC
  Administered 2015-09-07 – 2015-09-13 (×4): 40 mg via ORAL
  Filled 2015-09-07 (×4): qty 1

## 2015-09-07 MED ORDER — CARVEDILOL 6.25 MG PO TABS
6.2500 mg | ORAL_TABLET | Freq: Two times a day (BID) | ORAL | Status: DC
  Administered 2015-09-07 – 2015-09-13 (×13): 6.25 mg via ORAL
  Filled 2015-09-07 (×10): qty 1
  Filled 2015-09-07: qty 2
  Filled 2015-09-07 (×2): qty 1

## 2015-09-07 MED ORDER — MAGNESIUM OXIDE 400 (241.3 MG) MG PO TABS
400.0000 mg | ORAL_TABLET | Freq: Every day | ORAL | Status: DC
  Administered 2015-09-07 – 2015-09-13 (×7): 400 mg via ORAL
  Filled 2015-09-07 (×8): qty 1

## 2015-09-07 MED ORDER — LOSARTAN POTASSIUM 25 MG PO TABS
25.0000 mg | ORAL_TABLET | Freq: Every day | ORAL | Status: DC
  Administered 2015-09-08 – 2015-09-13 (×6): 25 mg via ORAL
  Filled 2015-09-07 (×7): qty 1

## 2015-09-07 MED ORDER — IOHEXOL 350 MG/ML SOLN
75.0000 mL | Freq: Once | INTRAVENOUS | Status: AC | PRN
  Administered 2015-09-07: 75 mL via INTRAVENOUS

## 2015-09-07 MED ORDER — PIPERACILLIN-TAZOBACTAM 3.375 G IVPB
3.3750 g | Freq: Three times a day (TID) | INTRAVENOUS | Status: DC
  Administered 2015-09-07 – 2015-09-12 (×15): 3.375 g via INTRAVENOUS
  Filled 2015-09-07 (×17): qty 50

## 2015-09-07 MED ORDER — FUROSEMIDE 40 MG PO TABS
80.0000 mg | ORAL_TABLET | ORAL | Status: DC
  Administered 2015-09-08 – 2015-09-12 (×3): 80 mg via ORAL
  Filled 2015-09-07 (×3): qty 2

## 2015-09-07 MED ORDER — DEXTROSE 5 % IV SOLN
2.0000 g | Freq: Three times a day (TID) | INTRAVENOUS | Status: DC
  Administered 2015-09-07 (×2): 2 g via INTRAVENOUS
  Filled 2015-09-07 (×3): qty 2

## 2015-09-07 MED ORDER — ESCITALOPRAM OXALATE 10 MG PO TABS
10.0000 mg | ORAL_TABLET | Freq: Every day | ORAL | Status: DC
Start: 2015-09-07 — End: 2015-09-13
  Administered 2015-09-07 – 2015-09-13 (×7): 10 mg via ORAL
  Filled 2015-09-07 (×8): qty 1

## 2015-09-07 MED ORDER — VANCOMYCIN HCL IN DEXTROSE 1-5 GM/200ML-% IV SOLN
1000.0000 mg | Freq: Once | INTRAVENOUS | Status: AC
Start: 2015-09-07 — End: 2015-09-07
  Administered 2015-09-07: 1000 mg via INTRAVENOUS
  Filled 2015-09-07: qty 200

## 2015-09-07 MED ORDER — PANTOPRAZOLE SODIUM 40 MG PO TBEC
40.0000 mg | DELAYED_RELEASE_TABLET | Freq: Every day | ORAL | Status: DC
Start: 2015-09-07 — End: 2015-09-13
  Administered 2015-09-07 – 2015-09-13 (×7): 40 mg via ORAL
  Filled 2015-09-07 (×7): qty 1

## 2015-09-07 MED ORDER — INSULIN ASPART 100 UNIT/ML ~~LOC~~ SOLN
0.0000 [IU] | Freq: Every day | SUBCUTANEOUS | Status: DC
Start: 2015-09-07 — End: 2015-09-13
  Administered 2015-09-09: 4 [IU] via SUBCUTANEOUS
  Administered 2015-09-10: 100 [IU] via SUBCUTANEOUS
  Administered 2015-09-11: 3 [IU] via SUBCUTANEOUS
  Administered 2015-09-12: 4 [IU] via SUBCUTANEOUS
  Filled 2015-09-07: qty 4
  Filled 2015-09-07: qty 3
  Filled 2015-09-07 (×2): qty 4

## 2015-09-07 MED ORDER — MELATONIN 1 MG PO TABS: 1.0000 mg | ORAL_TABLET | Freq: Every day | ORAL | Status: DC

## 2015-09-07 MED ORDER — VANCOMYCIN HCL IN DEXTROSE 1-5 GM/200ML-% IV SOLN
1000.0000 mg | INTRAVENOUS | Status: DC
  Administered 2015-09-07: 1000 mg via INTRAVENOUS
  Filled 2015-09-07: qty 200

## 2015-09-07 MED ORDER — VITAMIN D (ERGOCALCIFEROL) 1.25 MG (50000 UNIT) PO CAPS
50000.0000 [IU] | ORAL_CAPSULE | ORAL | Status: DC
  Administered 2015-09-07: 50000 [IU] via ORAL
  Filled 2015-09-07: qty 1

## 2015-09-07 MED ORDER — INSULIN ASPART 100 UNIT/ML ~~LOC~~ SOLN
0.0000 [IU] | Freq: Three times a day (TID) | SUBCUTANEOUS | Status: DC
  Administered 2015-09-07: 1 [IU] via SUBCUTANEOUS
  Administered 2015-09-07 – 2015-09-08 (×2): 2 [IU] via SUBCUTANEOUS
  Administered 2015-09-09 – 2015-09-10 (×3): 3 [IU] via SUBCUTANEOUS
  Administered 2015-09-10: 7 [IU] via SUBCUTANEOUS
  Administered 2015-09-10: 3 [IU] via SUBCUTANEOUS
  Administered 2015-09-11: 5 [IU] via SUBCUTANEOUS
  Administered 2015-09-11 – 2015-09-12 (×2): 1 [IU] via SUBCUTANEOUS
  Administered 2015-09-12: 9 [IU] via SUBCUTANEOUS
  Administered 2015-09-13: 3 [IU] via SUBCUTANEOUS
  Filled 2015-09-07: qty 3
  Filled 2015-09-07: qty 9
  Filled 2015-09-07: qty 3
  Filled 2015-09-07: qty 2
  Filled 2015-09-07: qty 3
  Filled 2015-09-07 (×3): qty 1
  Filled 2015-09-07: qty 7
  Filled 2015-09-07: qty 2
  Filled 2015-09-07: qty 5
  Filled 2015-09-07 (×2): qty 3

## 2015-09-07 MED ORDER — DEXTROSE 5 % IV SOLN: 2.0000 g | Freq: Three times a day (TID) | INTRAVENOUS | Status: DC

## 2015-09-07 MED ORDER — PIPERACILLIN-TAZOBACTAM 3.375 G IVPB
3.3750 g | Freq: Once | INTRAVENOUS | Status: AC
  Administered 2015-09-07: 3.375 g via INTRAVENOUS
  Filled 2015-09-07: qty 50

## 2015-09-07 MED ORDER — SODIUM CHLORIDE 0.9 % IV SOLN
Freq: Once | INTRAVENOUS | Status: AC
  Administered 2015-09-07: via INTRAVENOUS

## 2015-09-07 MED ORDER — FENTANYL 50 MCG/HR TD PT72
50.0000 ug | MEDICATED_PATCH | TRANSDERMAL | Status: DC
  Administered 2015-09-07 – 2015-09-13 (×4): 50 ug via TRANSDERMAL
  Filled 2015-09-07 (×4): qty 1

## 2015-09-07 MED ORDER — MAGIC MOUTHWASH
5.0000 mL | Freq: Three times a day (TID) | ORAL | Status: DC
  Administered 2015-09-07 (×2): 5 mL via ORAL
  Administered 2015-09-08: 10 mL via ORAL
  Administered 2015-09-08 – 2015-09-09 (×3): 5 mL via ORAL
  Administered 2015-09-09: 10 mL via ORAL
  Administered 2015-09-10 – 2015-09-13 (×9): 5 mL via ORAL
  Filled 2015-09-07 (×18): qty 10

## 2015-09-07 MED ORDER — IPRATROPIUM-ALBUTEROL 0.5-2.5 (3) MG/3ML IN SOLN
3.0000 mL | Freq: Four times a day (QID) | RESPIRATORY_TRACT | Status: DC
  Administered 2015-09-07 – 2015-09-13 (×25): 3 mL via RESPIRATORY_TRACT
  Filled 2015-09-07 (×26): qty 3

## 2015-09-07 MED ORDER — APIXABAN 5 MG PO TABS
5.0000 mg | ORAL_TABLET | Freq: Two times a day (BID) | ORAL | Status: DC
  Administered 2015-09-07 – 2015-09-13 (×13): 5 mg via ORAL
  Filled 2015-09-07 (×13): qty 1

## 2015-09-07 MED ORDER — FERROUS SULFATE 325 (65 FE) MG PO TABS
325.0000 mg | ORAL_TABLET | Freq: Every day | ORAL | Status: DC
  Administered 2015-09-08 – 2015-09-13 (×6): 325 mg via ORAL
  Filled 2015-09-07 (×6): qty 1

## 2015-09-07 NOTE — ED Notes (Signed)
MD Brown at bedside.

## 2015-09-07 NOTE — Progress Notes (Signed)
Pastoral Care provided for spouse.

## 2015-09-07 NOTE — Progress Notes (Signed)
Pharmacy Antibiotic Note  [name] is a 82 y.o. female admitted on 09/06/2015 with pneumonia.  Pharmacy has been consulted for Zosyn dosing.  Plan: Continue Zosyn 3.375 gm IV q8h per EI protocol.  Discussed allergy to cephalosporins with MD.  Patient received Zosyn 3.375 gm IV once in ED with no issue and has received doses in 2013 upon chart review.    Height: 5\' 7"  (170.2 cm) Weight: 173 lb 4.5 oz (78.6 kg) IBW/kg (Calculated) : 61.6  Temp (24hrs), Avg:98.4 F (36.9 C), Min:98 F (36.7 C), Max:99.2 F (37.3 C)   Recent Labs Lab 09/01/15 1121 09/02/15 0449 09/03/15 0419 09/06/15 2318 09/07/15 0536 09/07/15 0814  WBC 12.6* 10.3  --  14.7* 11.4*  --   CREATININE 1.07* 0.88 1.07* 1.04* 1.09*  --   LATICACIDVEN  --   --   --   --  1.3 1.0    Estimated Creatinine Clearance: 38.5 mL/min (by C-G formula based on Cr of 1.09).    Allergies  Allergen Reactions  . Ceftriaxone Swelling  . Cefuroxime Hives  . Cephalosporins Swelling and Other (See Comments)    Reaction:  Fainting/dry mouth   . Codeine Nausea And Vomiting  . Epinephrine Other (See Comments)    Reaction:  Fainting   . Morphine And Related Other (See Comments)    Reaction:  GI upset   . Buprenorphine Hcl Nausea And Vomiting  . Ciprofloxacin Itching, Swelling and Rash  . Latex Rash  . Lidocaine Rash  . Procaine Rash    Antimicrobials this admission: Anti-infectives    Start     Dose/Rate Route Frequency Ordered Stop   09/07/15 2000  piperacillin-tazobactam (ZOSYN) IVPB 3.375 g     3.375 g 12.5 mL/hr over 240 Minutes Intravenous 3 times per day 09/07/15 1417     09/07/15 0930  vancomycin (VANCOCIN) IVPB 1000 mg/200 mL premix  Status:  Discontinued     1,000 mg 200 mL/hr over 60 Minutes Intravenous Every 24 hours 09/07/15 0430 09/07/15 1417   09/07/15 0600  aztreonam (AZACTAM) 2 g in dextrose 5 % 50 mL IVPB  Status:  Discontinued     2 g 100 mL/hr over 30 Minutes Intravenous 3 times per day 09/07/15  0440 09/07/15 0448   09/07/15 0600  aztreonam (AZACTAM) 2 g in dextrose 5 % 50 mL IVPB  Status:  Discontinued     2 g 100 mL/hr over 30 Minutes Intravenous 3 times per day 09/07/15 0416 09/07/15 1417   09/07/15 0145  piperacillin-tazobactam (ZOSYN) IVPB 3.375 g     3.375 g 12.5 mL/hr over 240 Minutes Intravenous  Once 09/07/15 0133 09/07/15 0542   09/07/15 0130  vancomycin (VANCOCIN) IVPB 1000 mg/200 mL premix     1,000 mg 200 mL/hr over 60 Minutes Intravenous  Once 09/07/15 0125 09/07/15 0234   09/06/15 2330  azithromycin (ZITHROMAX) 500 mg in dextrose 5 % 250 mL IVPB     500 mg 250 mL/hr over 60 Minutes Intravenous  Once 09/06/15 2324 09/07/15 0038     Microbiology results: Results for orders placed or performed during the hospital encounter of 09/06/15  Blood Culture (routine x 2)     Status: None (Preliminary result)   Collection Time: 09/06/15 11:18 PM  Result Value Ref Range Status   Specimen Description BLOOD LEFT ANTECUBITAL  Final   Special Requests BOTTLES DRAWN AEROBIC AND ANAEROBIC 5ML  Final   Culture NO GROWTH < 12 HOURS  Final   Report Status  PENDING  Incomplete  Blood Culture (routine x 2)     Status: None (Preliminary result)   Collection Time: 09/06/15 11:18 PM  Result Value Ref Range Status   Specimen Description BLOOD BLOOD LEFT FOREARM  Final   Special Requests BOTTLES DRAWN AEROBIC AND ANAEROBIC 5ML  Final   Culture NO GROWTH < 12 HOURS  Final   Report Status PENDING  Incomplete  Rapid Influenza A&B Antigens ([facility] only)     Status: None   Collection Time: 09/06/15 11:18 PM  Result Value Ref Range Status   Influenza A ([place]) NEGATIVE NEGATIVE Final   Influenza B ([facility]) NEGATIVE NEGATIVE Final  MRSA PCR Screening     Status: None   Collection Time: 09/07/15  4:50 AM  Result Value Ref Range Status   MRSA by PCR NEGATIVE NEGATIVE Final    Comment:        The GeneXpert MRSA Assay (FDA approved for NASAL specimens only), is one component of a comprehensive  MRSA colonization surveillance program. It is not intended to diagnose MRSA infection nor to guide or monitor treatment for MRSA infections.     Thank you for allowing pharmacy to be a part of this patient's care.  Author [place] G 09/07/2015 4:22 PM

## 2015-09-07 NOTE — Progress Notes (Signed)
Pharmacy Antibiotic Note  [name] is a 82 y.o. female admitted on 09/06/2015 with pneumonia.  Pharmacy has been consulted for vancomycin & aztreonam dosing.  Plan: Aztreonam 2 g IV q8h  Patient received vancomycin 1000 mg dose x 1 in ED. Will follow with maintenance dose of vancomycin 1000 mg daily (with stacked dose to begin 8 hours after initial dose). Vancomycin trough scheduled for 3/18 at 0900, which is prior to 4th dose.  Goal vanc trough 15 - 20 mcg/mL  Kinetics using adjusted body weight of 66.8 kg Ke: 0.037 Half-life: 18.7 hours Vd: 46L Cmin (calculated): ~14 mcg/mL  Height: 5\' 5"  (165.1 cm) Weight: 180 lb (81.647 kg) IBW/kg (Calculated) : 57  Temp (24hrs), Avg:98.2 F (36.8 C), Min:98.2 F (36.8 C), Max:98.2 F (36.8 C)   Recent Labs Lab 09/01/15 1121 09/02/15 0449 09/03/15 0419 09/06/15 2318  WBC 12.6* 10.3  --  14.7*  CREATININE 1.07* 0.88 1.07* 1.04*    Estimated Creatinine Clearance: 39.4 mL/min (by C-G formula based on Cr of 1.04).    Allergies  Allergen Reactions  . Ceftriaxone Swelling  . Cefuroxime Hives  . Cephalosporins Swelling and Other (See Comments)    Reaction:  Fainting/dry mouth   . Codeine Nausea And Vomiting  . Epinephrine Other (See Comments)    Reaction:  Fainting   . Morphine And Related Other (See Comments)    Reaction:  GI upset   . Buprenorphine Hcl Nausea And Vomiting  . Ciprofloxacin Itching, Swelling and Rash  . Latex Rash  . Lidocaine Rash  . Procaine Rash    Antimicrobials this admission: vancomycin 3/16 >>  aztreonam 3/16 >>  One dose of piperacillin/tazobactam and azithromycin in ED  Dose adjustments this admission: n/a  Microbiology results: 3/15 BCx: Sent 3/15 UCx: Sent   Thank you for allowing pharmacy to be a part of this patient's care.  Lenis Noon, PharmD Clinical Pharmacist 09/07/2015 4:31 AM

## 2015-09-07 NOTE — Evaluation (Signed)
Clinical/Bedside Swallow Evaluation Patient Details  [name] MRN: [id number] Date of Birth: 02/02/1925  Today's Date: 09/07/2015 Time: SLP Start Time (ACUTE ONLY): 1500 SLP Stop Time (ACUTE ONLY): 1600 SLP Time Calculation (min) (ACUTE ONLY): 60 min  Past Medical History:  Past Medical History  Diagnosis Date  . CHF (congestive heart failure) ([place])   . Diabetes mellitus without complication ([place])   . Hypertension   . Chronic back pain   . Cancer ([place])     skin ca   Past Surgical History:  Past Surgical History  Procedure Laterality Date  . Pacemaker placement    . Leg surgery    . Abdominal hysterectomy    . Tonsillectomy    . Back surgery      x3  . Coronary stent placement      unknown location per pt  . Foot surgery     HPI:  Pt is a 82 y.o. female with a known history of congestive heart failure, diabetes mellitus type 2, hypertension, chronic back pain presented to the emergency room with confusion and lethargy. Patient was recently admitted to our hospital last Friday and treated for respiratory distress heart failure and urinary tract infection. She was discharged to home on Sunday. But according to family members she has been more lethargic and confused since one day. Patient unable to give any history only arousable to painful stimuli and loud verbal commands. Workup in the emergency room showed a bilateral pneumonia and low sodium level. The patient appeared dry and dehydrated. According to a family member patient also had some cough and a fall hitting her head ~3 weeks ago. Pt stated she usually eats a regular diet at home and denies any difficulty swallowing; husband agreed. Pt attends Cardiopulmonary Rehab workout classes at [facility]. Noted a baseline, congested cough when moving about/increased breathing. Of note, this was not noted in her H&P, however, pt states she takes a PPI(40mg ) for "acid reflux" and that she has been taking a medication(pill) for  Thrush in the past ~2 weeks. Pt stated she has felt she has been awakening in the mornings w/ a phlegm coating her throat and mouth - could have been at risk for this "phlegm" or even Thrush to have compromised her airway/entrance. No known Neurological events other than the fall w/ hit to back of head ~3 weeks ago.   Assessment / Plan / Recommendation Clinical Impression  Pt appears to adequately tolerate trials of thin liquids and soft solids w/ no immedidate, overt s/s of aspiration noted; no decline in respiratory status and clear vocal quality noted b/t trials. However, as demand of breathing increased w/ po trials, pt did exhibit a mild throat clearing/cough x1. This did not increase in consistency or presentation; pt has a baseline, congested cough sec. to PNA. Oral phase wfl. Pt fed self w/ setup assistance sec. to overall weakness. Pt's risk for aspiration is increased impaired respiratory status at this time. Rec. continue w/ current diet w/ strict aspiration precautions including meds in puree if nec. Discussed diet options as pt has reduced desire for po's. Rec. f/u w/ MD re: concern for ongoing Thrush and need to tx such as well as continue PPI.     Aspiration Risk  Mild aspiration risk (sec. to respiratory status but reduced following precs.)    Diet Recommendation  regular diet(soft foods, moistened), thin liquids; strict aspiration precautions; strict Reflux precautions; tray setup at meals and positioning upright; rest breaks to avoid WOB/SOB  during meals.  Medication Administration: Whole meds with liquid (but in puree if easier at this time)    Other  Recommendations Recommended Consults:  (Dietician) Oral Care Recommendations: Oral care BID;Staff/trained caregiver to provide oral care   Follow up Recommendations  None (TBD)    Frequency and Duration min 2x/week  2 weeks       Prognosis Prognosis for Safe Diet Advancement: Good Barriers to Reach Goals:  (weakness)       Swallow Study   General Date of Onset: 09/06/15 HPI: Pt is a 82 y.o. female with a known history of congestive heart failure, diabetes mellitus type 2, hypertension, chronic back pain presented to the emergency room with confusion and lethargy. Patient was recently admitted to our hospital last Friday and treated for respiratory distress heart failure and urinary tract infection. She was discharged to home on Sunday. But according to family members she has been more lethargic and confused since one day. Patient unable to give any history only arousable to painful stimuli and loud verbal commands. Workup in the emergency room showed a bilateral pneumonia and low sodium level. The patient appeared dry and dehydrated. According to a family member patient also had some cough and a fall hitting her head ~3 weeks ago. Pt stated she usually eats a regular diet at home and denies any difficulty swallowing; husband agreed. Pt attends Cardiopulmonary Rehab workout classes at [facility]. Noted a baseline, congested cough when moving about/increased breathing. Of note, this was not noted in her H&P, however, pt states she takes a PPI(40mg ) for "acid reflux" and that she has been taking a medication(pill) for Thrush in the past ~2 weeks. Pt stated she has felt she has been awakening in the mornings w/ a phlegm coating her throat and mouth - could have been at risk for this "phlegm" or even Thrush to have compromised her airway/entrance. No known Neurological events other than the fall w/ hit to back of head ~3 weeks ago. Type of Study: Bedside Swallow Evaluation Previous Swallow Assessment: none Diet Prior to this Study: Regular;Thin liquids Temperature Spikes Noted: No (wbc elevated 11.4) Respiratory Status: Nasal cannula (2-3 liters) History of Recent Intubation: No Behavior/Cognition: Alert;Cooperative;Pleasant mood (extremely fatigued) Oral Cavity Assessment: Within Functional Limits Oral Care Completed by SLP:  Recent completion by staff Oral Cavity - Dentition: Adequate natural dentition Vision: Functional for self-feeding Self-Feeding Abilities: Able to feed self;Needs set up (weak) Patient Positioning: Upright in bed Baseline Vocal Quality: Normal Volitional Cough: Strong;Congested Volitional Swallow: Able to elicit    Oral/Motor/Sensory Function Overall Oral Motor/Sensory Function: Within functional limits   Ice Chips Ice chips: Within functional limits Presentation: Spoon (fed; 2 trials)   Thin Liquid Thin Liquid: Impaired Presentation: Cup;Self Fed;Straw (3-4 trial swallows w/ each mode) Oral Phase Impairments:  (none) Oral Phase Functional Implications:  (none) Pharyngeal  Phase Impairments: Throat Clearing - Delayed (x1 w/ all trials - unsure if related to aspiration) Other Comments: possible airway pressure changes during multiple swallowing/po trials triggered pressure changes enough to stimulate a cough vs laryngeal penetration or aspiration. Throat clearing was not consistent and did not increase as trials continued.    Nectar Thick Nectar Thick Liquid: Not tested   Honey Thick Honey Thick Liquid: Not tested   Puree Puree: Within functional limits Presentation: Spoon;Self Fed (5-6 trials)   Solid   GO   Solid: Within functional limits Presentation: Self Fed (3 trials)        Orinda Kenner, MS, CCC-SLP  [employer]  09/07/2015,4:24 PM

## 2015-09-07 NOTE — Progress Notes (Signed)
Inpatient Diabetes Program Recommendations  AACE/ADA: New Consensus Statement on Inpatient Glycemic Control (2015)  Target Ranges:  Prepandial:   less than 140 mg/dL      Peak postprandial:   less than 180 mg/dL (1-2 hours)      Critically ill patients:  140 - 180 mg/dL   Results for HILDER, MATTIELLO (MRN [id number]) as of 09/07/2015 12:57  Ref. Range 09/07/2015 04:40 09/07/2015 10:52  Glucose-Capillary Latest Ref Range: 65-99 mg/dL 164 (H) 166 (H)    Admit with: Pneumonia  History: DM, CHF  Home DM Meds: Amaryl 4 mg bid       Januvia 100 mg daily  Current Insulin Orders: Novolog Sensitive Correction Scale/ SSI (0-9 units) TID AC + HS      -Note Novolog Sensitive Correction Scale/ SSI (0-9 units) started this AM.  Home Oral DM meds currently on hold per ADA guidelines for hospitalization.  -Agree with current orders.    --Will follow patient during hospitalization--  Wyn Quaker RN, MSN, CDE Diabetes Coordinator Inpatient Glycemic Control Team Team Pager: [phone] (8a-5p)

## 2015-09-07 NOTE — ED Notes (Signed)
Patient transported to CT 

## 2015-09-07 NOTE — Progress Notes (Signed)
[facility] [place] [name]    MR#:  [id number]  DATE OF BIRTH:  January 28, 1925  SUBJECTIVE:  Still with cough and SOB weakness   REVIEW OF SYSTEMS:    Review of Systems  Constitutional: Positive for malaise/fatigue. Negative for fever and chills.  HENT: Negative for sore throat.   Eyes: Negative for blurred vision.  Respiratory: Positive for cough and shortness of breath. Negative for hemoptysis and wheezing.   Cardiovascular: Negative for chest pain, palpitations and leg swelling.  Gastrointestinal: Negative for nausea, vomiting, abdominal pain, diarrhea and blood in stool.  Genitourinary: Negative for dysuria.  Musculoskeletal: Negative for back pain.  Neurological: Positive for weakness. Negative for dizziness, tremors and headaches.  Endo/Heme/Allergies: Does not bruise/bleed easily.    Tolerating Diet: yes      DRUG ALLERGIES:   Allergies  Allergen Reactions  . Ceftriaxone Swelling  . Cefuroxime Hives  . Cephalosporins Swelling and Other (See Comments)    Reaction:  Fainting/dry mouth   . Codeine Nausea And Vomiting  . Epinephrine Other (See Comments)    Reaction:  Fainting   . Morphine And Related Other (See Comments)    Reaction:  GI upset   . Buprenorphine Hcl Nausea And Vomiting  . Ciprofloxacin Itching, Swelling and Rash  . Latex Rash  . Lidocaine Rash  . Procaine Rash    VITALS:  Blood pressure 126/52, pulse 70, temperature 98 F (36.7 C), temperature source Oral, resp. rate 18, height 5\' 7"  (1.702 m), weight 78.6 kg (173 lb 4.5 oz), SpO2 100 %.  PHYSICAL EXAMINATION:   Physical Exam  Constitutional: She is oriented to person, place, and time and well-developed, well-nourished, and in no distress. No distress.  HENT:  Head: Normocephalic.  Eyes: No scleral icterus.  Neck: Normal range of motion. Neck supple. No JVD present. No tracheal deviation present.  Cardiovascular: Normal rate,  regular rhythm and normal heart sounds.  Exam reveals no gallop and no friction rub.   No murmur heard. Pulmonary/Chest: Effort normal. No respiratory distress. She has no wheezes. She has rales. She exhibits no tenderness.  coarse  Abdominal: Soft. Bowel sounds are normal. She exhibits no distension and no mass. There is no tenderness. There is no rebound and no guarding.  Musculoskeletal: Normal range of motion. She exhibits no edema.  Neurological: She is alert and oriented to person, place, and time.  Skin: Skin is warm. No rash noted. No erythema.  Psychiatric: Affect and judgment normal.      LABORATORY PANEL:   CBC  Recent Labs Lab 09/07/15 0536  WBC 11.4*  HGB 9.4*  HCT 28.9*  PLT 168   ------------------------------------------------------------------------------------------------------------------  Chemistries   Recent Labs Lab 09/06/15 2318 09/07/15 0536  NA 130* 133*  K 4.3 3.8  CL 93* 95*  CO2 28 31  GLUCOSE 184* 172*  BUN 38* 34*  CREATININE 1.04* 1.09*  CALCIUM 8.2* 7.9*  AST 21  --   ALT 12*  --   ALKPHOS 71  --   BILITOT 0.7  --    ------------------------------------------------------------------------------------------------------------------  Cardiac Enzymes  Recent Labs Lab 09/01/15 1121  TROPONINI <0.03   ------------------------------------------------------------------------------------------------------------------  RADIOLOGY:  Ct Angio Chest Pe W/cm &/or Wo Cm  09/07/2015  CLINICAL DATA:  Acute onset of generalized weakness, cough and congestion. Initial encounter. EXAM: CT ANGIOGRAPHY CHEST WITH CONTRAST TECHNIQUE: Multidetector CT imaging of the chest was performed using the standard protocol during bolus administration of  intravenous contrast. Multiplanar CT image reconstructions and MIPs were obtained to evaluate the vascular anatomy. CONTRAST:  65mL OMNIPAQUE IOHEXOL 350 MG/ML SOLN COMPARISON:  Chest radiograph performed  09/06/2015, and CT of the chest performed 08/22/2015 FINDINGS: There is no evidence of pulmonary embolus. Patchy bilateral central airspace opacities are noted, more prominent on the right, concerning for multifocal pneumonia. Underlying chronic interstitial prominence is similar to the prior study. Additional peripheral opacities are seen at the lung bases. There is no evidence of pleural effusion or pneumothorax. No masses are identified; no abnormal focal contrast enhancement is seen. Previously noted right middle lobe nodules have resolved. Enlarged mediastinal nodes are seen. This includes a large subcarinal node measuring 2.3 cm in short axis, right paratracheal nodes measuring up to 2.0 cm in short axis, and aortopulmonary window nodes measuring up to 1.4 cm in short axis. Scattered bilateral hilar nodes are borderline normal in size. Diffuse coronary artery calcifications are seen. Biatrial enlargement is noted. A pacemaker is noted at the left chest wall, with leads ending at the right atrium and right ventricle. Scattered calcification is noted along the thoracic aorta. No axillary lymphadenopathy is seen. The thyroid gland is unremarkable in appearance. There is reflux of contrast into the IVC and hepatic veins. The visualized portions of the spleen are unremarkable. No acute osseous abnormalities are seen. Thoracolumbar spinal fusion hardware is partially imaged. Review of the MIP images confirms the above findings. IMPRESSION: 1. No evidence of pulmonary embolus. 2. Patchy bilateral central airspace opacities, more prominent on the right, concerning for multifocal pneumonia. 3. Underlying chronic interstitial prominence is similar to the prior study. 4. Enlarged mediastinal nodes are significantly increased in size from the prior study, measuring up to 2.3 cm in short axis. Would correlate with the patient's history of malignancy. Lymphoma could have such an appearance. 5. Diffuse coronary artery  calcifications seen. Biatrial enlargement noted. 6. Reflux of contrast into the IVC and hepatic veins. Electronically Signed   By: Garald Balding M.D.   On: 09/07/2015 00:53   Dg Chest Portable 1 View  09/07/2015  CLINICAL DATA:  Acute onset of generalized weakness, cough and congestion. Initial encounter. EXAM: PORTABLE CHEST 1 VIEW COMPARISON:  Chest radiograph from 09/03/2015 FINDINGS: The lungs are well-aerated. Vascular congestion is noted, with mildly increased interstitial markings, raising concern for mild interstitial edema. There is no evidence of focal opacification, pleural effusion or pneumothorax. The cardiomediastinal silhouette is mildly enlarged. A pacemaker is noted overlying the left chest wall, with leads ending overlying the right atrium and right ventricle. No acute osseous abnormalities are seen. Thoracolumbar spinal fusion hardware is partially imaged. IMPRESSION: Vascular congestion and mild cardiomegaly, with mildly increased interstitial markings, raising concern for mild interstitial edema. Electronically Signed   By: Garald Balding M.D.   On: 09/07/2015 00:13     ASSESSMENT AND PLAN:     82 year old female history of atrial fibrillation, chronic systolic heart failure EF 5% and chronic trial fibrillation he was recently hospitalized for pneumonia presents with shortnes of breath and cough and found to have hypoxia.  1.acute hypoxic respiratory failure:this is due to multifocal pneumonia. She was treated as an outpatient on Levaquin but failed therapy Continue aztreonam and vancomycin for HCAP. Wean O2 s tolerated.  2. HCAP: follow up blood cultures and continue AZTREONAM and VANCOMYCIN. Follow up on speech evaluation  3.DM: Continue ADA diet, SSI. Hold glimepiride ad Januva  4. Chronic a fib: Continue Eliquis and Coreg.  5.  Chronic systolic  heart failure EF 35%:Continue Aldactone and LAsix. Monitor BMP   6. Hyponatremia: Improved with IV fluids.  7.  Generalized weakness: PT/OT for dispo  Management plans discussed with the patient and she is in agreement.  CODE STATUS: FULL  TOTAL TIME TAKING CARE OF THIS PATIENT: 30 minutes.    D/w family at bedside POSSIBLE D/C 3-4 days, DEPENDING ON CLINICAL CONDITION.   Abbas Beyene M.D on 09/07/2015 at 11:42 AM  Between 7am to 6pm - Pager - [phone] After 6pm go to [email or web address] - password [email or web address] [facility]  Office  [phone]  CC: Primary care physician; Tracie Harrier, MD  Note: This dictation was prepared with Dragon dictation along with smaller phrase technology. Any transcriptional errors that result from this process are unintentional.

## 2015-09-07 NOTE — Progress Notes (Signed)
Seen by Dr Benjie Karvonen.  Daughter and pt  Husband at bedside when doctor here.  Home situation and needs discussed. Pt took ice chips and meds easily.  Medds whole in aplezaue.  Losaren held due to decreased bp.  Dr Benjie Karvonen aware.  Ready for transfer to [facility]. Report given

## 2015-09-07 NOTE — H&P (Signed)
[facility] [name]    MR#:  [id number]  DATE OF BIRTH:  10/13/1924  DATE OF ADMISSION:  09/06/2015  PRIMARY CARE PHYSICIAN: Tracie Harrier, MD   REQUESTING/REFERRING PHYSICIAN:   CHIEF COMPLAINT:   Chief Complaint  Patient presents with  . Weakness  . Cough    HISTORY OF PRESENT ILLNESS: [name]  is a 82 y.o. female with a known history of congestive heart failure, diabetes mellitus type 2, hypertension, chronic back pain presented to the emergency room with confusion and lethargy. Patient was recently admitted to our hospital last Friday and treated for respiratory distress heart failure and urinary tract infection. She was discharged to home on Sunday. But according to family members she has been more lethargic and confused since one day. Patient unable to give any history only arousable to painful stimuli and loud verbal commands. Workup in the emergency room showed a bilateral pneumonia and low sodium level. The patient appeared dry and dehydrated. According to a family member patient also had some cough, no history of any falls. Patient is usually independent in activities of daily living and uses home oxygen. She lives with her husband and takes care of him.  PAST MEDICAL HISTORY:   Past Medical History  Diagnosis Date  . CHF (congestive heart failure) ([place])   . Diabetes mellitus without complication ([place])   . Hypertension   . Chronic back pain   . Cancer ([place])     skin ca    PAST SURGICAL HISTORY: Past Surgical History  Procedure Laterality Date  . Pacemaker placement    . Leg surgery    . Abdominal hysterectomy    . Tonsillectomy    . Back surgery      x3  . Coronary stent placement      unknown location per pt  . Foot surgery      SOCIAL HISTORY:  Social History  Substance Use Topics  . Smoking status: Never Smoker   . Smokeless tobacco: Never Used  . Alcohol Use: No    FAMILY  HISTORY:  Family History  Problem Relation Age of Onset  . CVA Mother   . Diabetes Mellitus II Sister   . Heart disease Father     DRUG ALLERGIES:  Allergies  Allergen Reactions  . Ceftriaxone Swelling  . Cefuroxime Hives  . Cephalosporins Swelling and Other (See Comments)    Reaction:  Fainting/dry mouth   . Codeine Nausea And Vomiting  . Epinephrine Other (See Comments)    Reaction:  Fainting   . Morphine And Related Other (See Comments)    Reaction:  GI upset   . Buprenorphine Hcl Nausea And Vomiting  . Ciprofloxacin Itching, Swelling and Rash  . Latex Rash  . Lidocaine Rash  . Procaine Rash    REVIEW OF SYSTEMS:  Unable to obtain, patient is lethargic and confused.  MEDICATIONS AT HOME:  Prior to Admission medications   Medication Sig Start Date End Date Taking? Authorizing Provider  apixaban (ELIQUIS) 5 MG TABS tablet Take 5 mg by mouth 2 (two) times daily.   Yes Historical Provider, MD  budesonide-formoterol (SYMBICORT) 160-4.5 MCG/ACT inhaler Inhale 2 puffs into the lungs 2 (two) times daily. 09/03/15  Yes Gladstone Lighter, MD  carvedilol (COREG) 6.25 MG tablet Take 6.25 mg by mouth 2 (two) times daily with a meal.    Yes Historical Provider, MD  cholecalciferol (VITAMIN D) 1000 units tablet Take 1,000  Units by mouth daily.   Yes Historical Provider, MD  docusate sodium (COLACE) 100 MG capsule Take 100 mg by mouth 2 (two) times daily.    Yes Historical Provider, MD  escitalopram (LEXAPRO) 10 MG tablet Take 10 mg by mouth daily.   Yes Historical Provider, MD  fentaNYL (DURAGESIC - DOSED MCG/HR) 50 MCG/HR Place 50 mcg onto the skin every other day.    Yes Historical Provider, MD  ferrous sulfate 325 (65 FE) MG tablet Take 325 mg by mouth daily with breakfast.   Yes Historical Provider, MD  furosemide (LASIX) 40 MG tablet Take 40 mg by mouth every other day. Pt alternates with the 80mg  tablet.   Yes Historical Provider, MD  furosemide (LASIX) 80 MG tablet Take 80 mg by  mouth every other day. Pt alternates with the 40mg  tablet.   Yes Historical Provider, MD  glimepiride (AMARYL) 4 MG tablet Take 4 mg by mouth 2 (two) times daily after a meal.   Yes Historical Provider, MD  levofloxacin (LEVAQUIN) 250 MG tablet Take 1 tablet (250 mg total) by mouth daily. X 5 days 09/03/15  Yes Gladstone Lighter, MD  losartan (COZAAR) 25 MG tablet Take 25 mg by mouth daily.   Yes Historical Provider, MD  magnesium oxide (MAG-OX) 400 MG tablet Take 400 mg by mouth daily.   Yes Historical Provider, MD  Melatonin 1 MG TABS Take 1 mg by mouth at bedtime.   Yes Historical Provider, MD  metolazone (ZAROXOLYN) 2.5 MG tablet Take 2.5 mg by mouth 2 (two) times a week. Pt takes on Monday and Friday.   Yes Historical Provider, MD  oxyCODONE (OXY IR/ROXICODONE) 5 MG immediate release tablet Take 5-10 mg by mouth every 4 (four) hours as needed for severe pain.    Yes Historical Provider, MD  pantoprazole (PROTONIX) 40 MG tablet Take 40 mg by mouth daily.   Yes Historical Provider, MD  rOPINIRole (REQUIP) 0.5 MG tablet Take 0.5 mg by mouth 2 (two) times daily.   Yes Historical Provider, MD  sitaGLIPtin (JANUVIA) 100 MG tablet Take 100 mg by mouth daily.   Yes Historical Provider, MD  spironolactone (ALDACTONE) 25 MG tablet Take 25 mg by mouth daily.    Yes Historical Provider, MD  vitamin B-12 (CYANOCOBALAMIN) 1000 MCG tablet Take 1,000 mcg by mouth daily.   Yes Historical Provider, MD  vitamin C (ASCORBIC ACID) 500 MG tablet Take 500 mg by mouth daily.   Yes Historical Provider, MD  Vitamin D, Ergocalciferol, (DRISDOL) 50000 UNITS CAPS capsule Take 50,000 Units by mouth every 7 (seven) days. Pt takes on Thursday.   Yes Historical Provider, MD      PHYSICAL EXAMINATION:   VITAL SIGNS: Blood pressure 111/44, pulse 70, temperature 98.2 F (36.8 C), temperature source Oral, resp. rate 20, height 5\' 5"  (1.651 m), weight 81.647 kg (180 lb), SpO2 94 %.  GENERAL:  82 y.o.-year-old patient lying in  the bed in mild distress.  EYES: Pupils equal, round, reactive to light and accommodation. No scleral icterus. Extraocular muscles intact.  HEENT: Head atraumatic, normocephalic. Oropharynx dry and nasopharynx clear.  NECK:  Supple, no jugular venous distention. No thyroid enlargement, no tenderness.  LUNGS: Decreased breath sounds bilaterally,bilateral rales heard, no rhonchi or crepitation. No use of accessory muscles of respiration.  CARDIOVASCULAR: S1, S2 normal. No murmurs, rubs, or gallops.  ABDOMEN: Soft, nontender, nondistended. Bowel sounds present. No organomegaly or mass.  EXTREMITIES: No pedal edema, cyanosis, or clubbing.  NEUROLOGIC: lethargic and  confused PSYCHIATRIC: Could not be assessed. SKIN: No obvious rash, lesion, or ulcer.   LABORATORY PANEL:   CBC  Recent Labs Lab 09/01/15 1121 09/02/15 0449 09/06/15 2318  WBC 12.6* 10.3 14.7*  HGB 9.4* 9.2* 10.1*  HCT 29.2* 27.7* 31.1*  PLT 160 147* 194  MCV 90.6 89.4 88.9  MCH 29.2 29.6 29.0  MCHC 32.2 33.1 32.7  RDW 15.0* 15.1* 15.3*  LYMPHSABS 0.4*  --  0.6*  MONOABS 0.8  --  1.2*  EOSABS 0.0  --  0.0  BASOSABS 0.1  --  0.1   ------------------------------------------------------------------------------------------------------------------  Chemistries   Recent Labs Lab 09/01/15 1121 09/02/15 0449 09/03/15 0419 09/06/15 2318  NA 136 134* 136 130*  K 4.3 4.3 3.9 4.3  CL 101 100* 99* 93*  CO2 30 26 29 28   GLUCOSE 225* 136* 118* 184*  BUN 35* 29* 31* 38*  CREATININE 1.07* 0.88 1.07* 1.04*  CALCIUM 8.4* 8.4* 8.3* 8.2*  AST 16  --   --  21  ALT 11*  --   --  12*  ALKPHOS 68  --   --  71  BILITOT 1.0  --   --  0.7   ------------------------------------------------------------------------------------------------------------------ estimated creatinine clearance is 39.4 mL/min (by C-G formula based on Cr of  1.04). ------------------------------------------------------------------------------------------------------------------ No results for input(s): TSH, T4TOTAL, T3FREE, THYROIDAB in the last 72 hours.  Invalid input(s): FREET3   Coagulation profile  Recent Labs Lab 09/01/15 1121  INR 2.11   ------------------------------------------------------------------------------------------------------------------- No results for input(s): DDIMER in the last 72 hours. -------------------------------------------------------------------------------------------------------------------  Cardiac Enzymes  Recent Labs Lab 09/01/15 1121  TROPONINI <0.03   ------------------------------------------------------------------------------------------------------------------ Invalid input(s): POCBNP  ---------------------------------------------------------------------------------------------------------------  Urinalysis    Component Value Date/Time   COLORURINE YELLOW* 09/06/2015 2331   COLORURINE Yellow 08/19/2013 1455   APPEARANCEUR CLEAR* 09/06/2015 2331   APPEARANCEUR Clear 08/19/2013 1455   LABSPEC 1.018 09/06/2015 2331   LABSPEC 1.019 08/19/2013 1455   PHURINE 5.0 09/06/2015 2331   PHURINE 5.0 08/19/2013 1455   GLUCOSEU NEGATIVE 09/06/2015 2331   GLUCOSEU Negative 08/19/2013 1455   HGBUR NEGATIVE 09/06/2015 2331   HGBUR Negative 08/19/2013 1455   BILIRUBINUR NEGATIVE 09/06/2015 2331   BILIRUBINUR Negative 08/19/2013 1455   KETONESUR NEGATIVE 09/06/2015 2331   KETONESUR Negative 08/19/2013 1455   PROTEINUR NEGATIVE 09/06/2015 2331   PROTEINUR Negative 08/19/2013 1455   NITRITE NEGATIVE 09/06/2015 2331   NITRITE Negative 08/19/2013 1455   LEUKOCYTESUR NEGATIVE 09/06/2015 2331   LEUKOCYTESUR 3+ 08/19/2013 1455     RADIOLOGY: Ct Angio Chest Pe W/cm &/or Wo Cm  09/07/2015  CLINICAL DATA:  Acute onset of generalized weakness, cough and congestion. Initial encounter. EXAM: CT  ANGIOGRAPHY CHEST WITH CONTRAST TECHNIQUE: Multidetector CT imaging of the chest was performed using the standard protocol during bolus administration of intravenous contrast. Multiplanar CT image reconstructions and MIPs were obtained to evaluate the vascular anatomy. CONTRAST:  50mL OMNIPAQUE IOHEXOL 350 MG/ML SOLN COMPARISON:  Chest radiograph performed 09/06/2015, and CT of the chest performed 08/22/2015 FINDINGS: There is no evidence of pulmonary embolus. Patchy bilateral central airspace opacities are noted, more prominent on the right, concerning for multifocal pneumonia. Underlying chronic interstitial prominence is similar to the prior study. Additional peripheral opacities are seen at the lung bases. There is no evidence of pleural effusion or pneumothorax. No masses are identified; no abnormal focal contrast enhancement is seen. Previously noted right middle lobe nodules have resolved. Enlarged mediastinal nodes are seen. This includes a large subcarinal node measuring 2.3  cm in short axis, right paratracheal nodes measuring up to 2.0 cm in short axis, and aortopulmonary window nodes measuring up to 1.4 cm in short axis. Scattered bilateral hilar nodes are borderline normal in size. Diffuse coronary artery calcifications are seen. Biatrial enlargement is noted. A pacemaker is noted at the left chest wall, with leads ending at the right atrium and right ventricle. Scattered calcification is noted along the thoracic aorta. No axillary lymphadenopathy is seen. The thyroid gland is unremarkable in appearance. There is reflux of contrast into the IVC and hepatic veins. The visualized portions of the spleen are unremarkable. No acute osseous abnormalities are seen. Thoracolumbar spinal fusion hardware is partially imaged. Review of the MIP images confirms the above findings. IMPRESSION: 1. No evidence of pulmonary embolus. 2. Patchy bilateral central airspace opacities, more prominent on the right, concerning  for multifocal pneumonia. 3. Underlying chronic interstitial prominence is similar to the prior study. 4. Enlarged mediastinal nodes are significantly increased in size from the prior study, measuring up to 2.3 cm in short axis. Would correlate with the patient's history of malignancy. Lymphoma could have such an appearance. 5. Diffuse coronary artery calcifications seen. Biatrial enlargement noted. 6. Reflux of contrast into the IVC and hepatic veins. Electronically Signed   By: Garald Balding M.D.   On: 09/07/2015 00:53   Dg Chest Portable 1 View  09/07/2015  CLINICAL DATA:  Acute onset of generalized weakness, cough and congestion. Initial encounter. EXAM: PORTABLE CHEST 1 VIEW COMPARISON:  Chest radiograph from 09/03/2015 FINDINGS: The lungs are well-aerated. Vascular congestion is noted, with mildly increased interstitial markings, raising concern for mild interstitial edema. There is no evidence of focal opacification, pleural effusion or pneumothorax. The cardiomediastinal silhouette is mildly enlarged. A pacemaker is noted overlying the left chest wall, with leads ending overlying the right atrium and right ventricle. No acute osseous abnormalities are seen. Thoracolumbar spinal fusion hardware is partially imaged. IMPRESSION: Vascular congestion and mild cardiomegaly, with mildly increased interstitial markings, raising concern for mild interstitial edema. Electronically Signed   By: Garald Balding M.D.   On: 09/07/2015 00:13    EKG: Orders placed or performed during the hospital encounter of 09/06/15  . ED EKG 12-Lead  . ED EKG 12-Lead  . EKG 12-Lead  . EKG 12-Lead    IMPRESSION AND PLAN: 82 year old female patient with history of congestive heart failure, type 2 diabetes mellitus, chronic back pain, hypertension presented to the emergency room with the confusion and lethargy and cough. Admitting diagnosis 1. Bilateral pneumonia 2. Healthcare associated pneumonia 3. Hyponatremia 4.  Disorientation 5. Dehydration Treatment plan Admit patient to telemetry Start patient on IV vancomycin and IV aztreonam antibiotics Hold diuretic medication IV hydration with normal saline Follow-up sodium level Continue anticoagulation with oral Eliquis Follow-up cultures Supportive care.  All the records are reviewed and case discussed with ED provider. Management plans discussed with the patient, family and they are in agreement.  CODE STATUS:FULL Code Status History    Date Active Date Inactive Code Status Order ID Comments User Context   09/01/2015  3:33 PM 09/03/2015  7:07 PM Full Code [id number]  Epifanio Lesches, MD ED   03/21/2015  6:26 AM 03/22/2015  7:39 PM Full Code [id number]  Harrie Foreman, MD Inpatient       TOTAL TIME TAKING CARE OF THIS PATIENT: 54 minutes.    Saundra Shelling M.D on 09/07/2015 at 3:29 AM  Between 7am to 6pm - Pager - [phone]  After 6pm go  to [email or web address] - password [email or web address] [facility]  Office  [phone]  CC: Primary care physician; Tracie Harrier, MD

## 2015-09-07 NOTE — Progress Notes (Signed)
Pharmacy Antibiotic Note  [name] is a 82 y.o. female admitted on 09/06/2015 with pneumonia.  Pharmacy has been consulted for vancomycin & aztreonam dosing. Patient with cephalosporin allergy but tolerated Zosyn in ED. D/C aztreonam and begin Zosyn.   Plan: Zosyn 3.375 g EI q 8 hours.   Continue vancomycin 1000 mg iv q 24 hours. Vancomycin trough scheduled with the 4th dose. Goal vancomycin trough 15-20 mcg/ml.    Height: 5\' 7"  (170.2 cm) Weight: 173 lb 4.5 oz (78.6 kg) IBW/kg (Calculated) : 61.6  Temp (24hrs), Avg:98.4 F (36.9 C), Min:98 F (36.7 C), Max:99.2 F (37.3 C)   Recent Labs Lab 09/01/15 1121 09/02/15 0449 09/03/15 0419 09/06/15 2318 09/07/15 0536 09/07/15 0814  WBC 12.6* 10.3  --  14.7* 11.4*  --   CREATININE 1.07* 0.88 1.07* 1.04* 1.09*  --   LATICACIDVEN  --   --   --   --  1.3 1.0    Estimated Creatinine Clearance: 38.5 mL/min (by C-G formula based on Cr of 1.09).    Allergies  Allergen Reactions  . Ceftriaxone Swelling  . Cefuroxime Hives  . Cephalosporins Swelling and Other (See Comments)    Reaction:  Fainting/dry mouth   . Codeine Nausea And Vomiting  . Epinephrine Other (See Comments)    Reaction:  Fainting   . Morphine And Related Other (See Comments)    Reaction:  GI upset   . Buprenorphine Hcl Nausea And Vomiting  . Ciprofloxacin Itching, Swelling and Rash  . Latex Rash  . Lidocaine Rash  . Procaine Rash    Antimicrobials this admission: vancomycin 3/16 >>  aztreonam 3/16 >> 3/16 One dose of piperacillin/tazobactam and azithromycin in ED Zosyn 3/16 >>  Dose adjustments this admission: n/a  Microbiology results: 3/15 BCx: NGTD x 2 3/15 UCx: Sent  MRSA PCR: negative  Thank you for allowing pharmacy to be a part of this patient's care.  Ulice Dash D, PharmD Clinical Pharmacist 09/07/2015 4:23 PM

## 2015-09-07 NOTE — Progress Notes (Signed)
Pharmacy ICU Daily Progress Note  [name] is an 82yo female admitted 09/06/15 for respiratory distress and AMS 2/2 bilateral HCAP and dehydration/hyponatremia. Of note, she was recently admitted last week and was treated for respiratory distress 2/2 heart failure and UTI.    Active pharmacy consults: vancomycin and antibiotic renal dose adjustment   Plan: 1. Continue vancomycin at current dose, though will recommend to D/C on rounds, as MRSA PCR (-).  2. All antibiotics adjusted for renal function. Will continue to monitor for changes that would require dose adjustments.  Infectious:  Antimicrobials:  Vancomycin 1g q24hr; 3/15>> Aztreonam 2g IV q8hr; 3/15>> Zosyn 3.375gm EIV x1 in ED on 3/15 WBC 11.4 Tmax 99.2 Procalcitonin/LDH: Lactic Acid 1.3>>1.0 Culture Results: Influenza A/B (-) MRSA PCR (-) 3/15 BCx x2 NGTD  Electrolytes:  Potassium: 3.8 Magnesium: none Phosphorus: none Supplementation plans: none  Pulmonary: Bronchodilators? duonebs q6hrs Ventilator status: None O2 sat/FiO2: 97 on 3LNC, 32%  Current steroids: None Taper plans:   GI:  Constipation PPx: docusate 100mg  BID Feeding status: heart healthy with thin liquids LBM: 3/15 SUP: pantoprazole 40mg  PO daily  Insulin:  SSI use in 24hrs: none Current Insulin orders: None (previously on glimepride, aspart, and linagliptin) Last 3 CBGs: 175, 118, 136  DVT PPx: Apixaban 5mg  BID  Sedation+Pain:  RASS goal?  GCS Last pain score:  Opioid use in last 24 hrs:   Pressors:  MAP goal:   Medication education/counseling required?

## 2015-09-07 NOTE — Progress Notes (Signed)
[facility]  Patient Status: Active   [facility] is providing the following services: SN  If patient discharges after hours, please call [phone].   [name] 09/07/2015, 4:15 PM

## 2015-09-08 LAB — GLUCOSE, CAPILLARY
GLUCOSE-CAPILLARY: 151 mg/dL — AB (ref 65–99)
Glucose-Capillary: 88 mg/dL (ref 65–99)
Glucose-Capillary: 110 mg/dL — ABNORMAL HIGH (ref 65–99)
Glucose-Capillary: 166 mg/dL — ABNORMAL HIGH (ref 65–99)

## 2015-09-08 LAB — STREP PNEUMONIAE URINARY ANTIGEN: Strep Pneumo Urinary Antigen: NEGATIVE

## 2015-09-08 LAB — LEGIONELLA PNEUMOPHILA SEROGP 1 UR AG: L. pneumophila Serogp 1 Ur Ag: NEGATIVE

## 2015-09-08 NOTE — Care Management (Signed)
Physician advisor approved [place]

## 2015-09-08 NOTE — Progress Notes (Signed)
[place] [place] [name]    MR#:  [id number]  DATE OF BIRTH:  1925/05/25  SUBJECTIVE:  Patient still with cough and shortness of breath. She is weak. She did work with physical therapy this morning.  REVIEW OF SYSTEMS:    Review of Systems  Constitutional: Positive for malaise/fatigue. Negative for fever and chills.  HENT: Negative for sore throat.   Eyes: Negative for blurred vision.  Respiratory: Positive for cough and shortness of breath. Negative for hemoptysis and wheezing.   Cardiovascular: Negative for chest pain, palpitations and leg swelling.  Gastrointestinal: Negative for nausea, vomiting, abdominal pain, diarrhea and blood in stool.  Genitourinary: Negative for dysuria.  Musculoskeletal: Negative for back pain.  Neurological: Positive for weakness. Negative for dizziness, tremors and headaches.  Endo/Heme/Allergies: Does not bruise/bleed easily.    Tolerating Diet: yes      DRUG ALLERGIES:   Allergies  Allergen Reactions  . Ceftriaxone Swelling  . Cefuroxime Hives  . Cephalosporins Swelling and Other (See Comments)    Reaction:  Fainting/dry mouth   . Codeine Nausea And Vomiting  . Epinephrine Other (See Comments)    Reaction:  Fainting   . Morphine And Related Other (See Comments)    Reaction:  GI upset   . Buprenorphine Hcl Nausea And Vomiting  . Ciprofloxacin Itching, Swelling and Rash  . Latex Rash  . Lidocaine Rash  . Procaine Rash    VITALS:  Blood pressure 113/44, pulse 73, temperature 97.5 F (36.4 C), temperature source Oral, resp. rate 18, height 5\' 7"  (1.702 m), weight 78.6 kg (173 lb 4.5 oz), SpO2 93 %.  PHYSICAL EXAMINATION:   Physical Exam  Constitutional: She is oriented to person, place, and time and well-developed, well-nourished, and in no distress. No distress.  HENT:  Head: Normocephalic.  Eyes: No scleral icterus.  Neck: Normal range of motion. Neck supple. No  JVD present. No tracheal deviation present.  Cardiovascular: Normal rate, regular rhythm and normal heart sounds.  Exam reveals no gallop and no friction rub.   No murmur heard. Pulmonary/Chest: Effort normal. No respiratory distress. She has no wheezes. She exhibits no tenderness.  coarse  Abdominal: Soft. Bowel sounds are normal. She exhibits no distension and no mass. There is no tenderness. There is no rebound and no guarding.  Musculoskeletal: Normal range of motion. She exhibits no edema.  Neurological: She is alert and oriented to person, place, and time.  Skin: Skin is warm. No rash noted. No erythema.  Psychiatric: Affect and judgment normal.      LABORATORY PANEL:   CBC  Recent Labs Lab 09/07/15 0536  WBC 11.4*  HGB 9.4*  HCT 28.9*  PLT 168   ------------------------------------------------------------------------------------------------------------------  Chemistries   Recent Labs Lab 09/06/15 2318 09/07/15 0536  NA 130* 133*  K 4.3 3.8  CL 93* 95*  CO2 28 31  GLUCOSE 184* 172*  BUN 38* 34*  CREATININE 1.04* 1.09*  CALCIUM 8.2* 7.9*  AST 21  --   ALT 12*  --   ALKPHOS 71  --   BILITOT 0.7  --    ------------------------------------------------------------------------------------------------------------------  Cardiac Enzymes No results for input(s): TROPONINI in the last 168 hours. ------------------------------------------------------------------------------------------------------------------  RADIOLOGY:  Ct Angio Chest Pe W/cm &/or Wo Cm  09/07/2015  CLINICAL DATA:  Acute onset of generalized weakness, cough and congestion. Initial encounter. EXAM: CT ANGIOGRAPHY CHEST WITH CONTRAST TECHNIQUE: Multidetector CT imaging of the chest was performed  using the standard protocol during bolus administration of intravenous contrast. Multiplanar CT image reconstructions and MIPs were obtained to evaluate the vascular anatomy. CONTRAST:  48mL OMNIPAQUE  IOHEXOL 350 MG/ML SOLN COMPARISON:  Chest radiograph performed 09/06/2015, and CT of the chest performed 08/22/2015 FINDINGS: There is no evidence of pulmonary embolus. Patchy bilateral central airspace opacities are noted, more prominent on the right, concerning for multifocal pneumonia. Underlying chronic interstitial prominence is similar to the prior study. Additional peripheral opacities are seen at the lung bases. There is no evidence of pleural effusion or pneumothorax. No masses are identified; no abnormal focal contrast enhancement is seen. Previously noted right middle lobe nodules have resolved. Enlarged mediastinal nodes are seen. This includes a large subcarinal node measuring 2.3 cm in short axis, right paratracheal nodes measuring up to 2.0 cm in short axis, and aortopulmonary window nodes measuring up to 1.4 cm in short axis. Scattered bilateral hilar nodes are borderline normal in size. Diffuse coronary artery calcifications are seen. Biatrial enlargement is noted. A pacemaker is noted at the left chest wall, with leads ending at the right atrium and right ventricle. Scattered calcification is noted along the thoracic aorta. No axillary lymphadenopathy is seen. The thyroid gland is unremarkable in appearance. There is reflux of contrast into the IVC and hepatic veins. The visualized portions of the spleen are unremarkable. No acute osseous abnormalities are seen. Thoracolumbar spinal fusion hardware is partially imaged. Review of the MIP images confirms the above findings. IMPRESSION: 1. No evidence of pulmonary embolus. 2. Patchy bilateral central airspace opacities, more prominent on the right, concerning for multifocal pneumonia. 3. Underlying chronic interstitial prominence is similar to the prior study. 4. Enlarged mediastinal nodes are significantly increased in size from the prior study, measuring up to 2.3 cm in short axis. Would correlate with the patient's history of malignancy. Lymphoma  could have such an appearance. 5. Diffuse coronary artery calcifications seen. Biatrial enlargement noted. 6. Reflux of contrast into the IVC and hepatic veins. Electronically Signed   By: Garald Balding M.D.   On: 09/07/2015 00:53   Dg Chest Portable 1 View  09/07/2015  CLINICAL DATA:  Acute onset of generalized weakness, cough and congestion. Initial encounter. EXAM: PORTABLE CHEST 1 VIEW COMPARISON:  Chest radiograph from 09/03/2015 FINDINGS: The lungs are well-aerated. Vascular congestion is noted, with mildly increased interstitial markings, raising concern for mild interstitial edema. There is no evidence of focal opacification, pleural effusion or pneumothorax. The cardiomediastinal silhouette is mildly enlarged. A pacemaker is noted overlying the left chest wall, with leads ending overlying the right atrium and right ventricle. No acute osseous abnormalities are seen. Thoracolumbar spinal fusion hardware is partially imaged. IMPRESSION: Vascular congestion and mild cardiomegaly, with mildly increased interstitial markings, raising concern for mild interstitial edema. Electronically Signed   By: Garald Balding M.D.   On: 09/07/2015 00:13     ASSESSMENT AND PLAN:     82 year old female history of atrial fibrillation, chronic systolic heart failure EF 5% and chronic trial fibrillation he was recently hospitalized for pneumonia presents with shortnes of breath and cough and found to have hypoxia.  1.acute hypoxic respiratory failure:this is due to multifocal pneumonia. She was treated as an outpatient on Levaquin but failed therapy Continue Zosyn  for HCAP. Wean O2 s tolerated.  2. HCAP: follow up Final blood cultures and continue Zosyn Follow up on speech evaluation  3.DM: Continue ADA diet, SSI. Hold glimepiride ad [place] diabetes coordinator consult. 4. Chronic a fib:  Continue Eliquis and Coreg.  5.  Chronic systolic heart failure EF 35%:Continue Aldactone and  LAsix. Monitor BMP   6. Hyponatremia: Improved with IV fluids.  7. Generalized weakness: Physical therapy was recommending skilled nursing facility. Clinical social worker aware Management plans discussed with the patient and she is in agreement.  CODE STATUS: FULL  TOTAL TIME TAKING CARE OF THIS PATIENT: 28 minutes.    D/w family at bedside POSSIBLE D/C   MONDAY OR TUESDAY DEPENDING ON CLINICAL CONDITION.   Lerin Jech M.D on 09/08/2015 at 1:26 PM  Between 7am to 6pm - Pager - [phone] After 6pm go to [email or web address] - password [email or web address] [facility]  Office  [phone]  CC: Primary care physician; Tracie Harrier, MD  Note: This dictation was prepared with Dragon dictation along with smaller phrase technology. Any transcriptional errors that result from this process are unintentional.

## 2015-09-08 NOTE — Consult Note (Signed)
   [facility] CM Inpatient Consult   09/08/2015  [name] July 10, 1924 [id number]  Patient was evaluated for eligibility of [facility] care management services. Patient is not eligible for [facility] care Management Services at this time. For questions please contact:  Mika Anastasi RN, [facility] Liaison  [phone]) Business Mobile [phone]) Toll free office
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
## 2015-09-08 NOTE — Care Management (Signed)
patient is readmitted with pneumonia.  Last discharge 3/12.  Discharged with new home 02 through [place].  had home health SN set up.  Patient has seen her PCP since hospital discharge.  Patient says she was doing fine until she spent "most of the day in the doctor's office for my follow up."  Has caregivers to assist she and her husband and an adult developmentally disabled adult child in the home.  Discussed HRI with jason from [facility] and made request of the physician advisor.

## 2015-09-08 NOTE — NC FL2 (Signed)
[place] LEVEL OF CARE SCREENING TOOL     IDENTIFICATION  Patient [name] Birthdate: 1925/04/30 Sex: female Admission Date (Current Location): 09/06/2015  [place] and [place] Number:  Engineering geologist and Address:  [facility], [address] [place], [place] [address]      Provider Number: [id number]  Attending Physician Name and Address:  Bettey Costa, MD  Relative Name and Phone Number:       Current Level of Care: Hospital Recommended Level of Care: [place] Prior Approval Number:    Date Approved/Denied:   PASRR Number:  [id number])  Discharge Plan: SNF    Current Diagnoses: Patient Active Problem List   Diagnosis Date Noted  . Pneumonia 09/07/2015  . Hyponatremia 09/07/2015  . Acute respiratory failure (Between) 09/01/2015  . Chronic diastolic heart failure ([place]) 04/10/2015  . Atrial fibrillation ([place]) 04/10/2015  . Essential hypertension 04/10/2015  . Diabetes ([place]) 04/10/2015  . Back pain 04/10/2015  . Acute on chronic systolic CHF (congestive heart failure) ([place]) 03/21/2015  . Pulmonary nodule 11/10/2014    Orientation RESPIRATION BLADDER Height & Weight     Time, Situation, Place, Self  O2 (Nasal Cannula 3 (L/min) ) Incontinent Weight: 173 lb 4.5 oz (78.6 kg) Height:  5\' 7"  (170.2 cm)  BEHAVIORAL SYMPTOMS/MOOD NEUROLOGICAL BOWEL NUTRITION STATUS   (None)  (None) Continent Diet (heart healthy/carb )  AMBULATORY STATUS COMMUNICATION OF NEEDS Skin   Extensive Assist Verbally Other (Comment) (Diabetic Ulcer Right Leg)                       Personal Care Assistance Level of Assistance  Bathing, Feeding, Dressing Bathing Assistance: Limited assistance Feeding assistance: Independent Dressing Assistance: Limited assistance     Functional Limitations Info  Sight, Hearing, Speech Sight Info: Adequate Hearing Info: Adequate Speech Info: Adequate    SPECIAL CARE FACTORS  FREQUENCY  PT (By licensed PT)   OT (By licensed OT)      PT Frequency:  (5)      OT Frequency: (5)        Contractures      Additional Factors Info  Code Status, Allergies Code Status Info:  (Full Code) Allergies Info:  (Ceftriaxone, Cefuroxime, Cephalosporins, Codeine, Epinephrine, Morphine And Related, Buprenorphine Hcl, Ciprofloxacin, Latex, Lidocaine, Procaine)           Current Medications (09/08/2015):  This is the current hospital active medication list Current Facility-Administered Medications  Medication Dose Route Frequency Provider Last Rate Last Dose  . apixaban (ELIQUIS) tablet 5 mg  5 mg Oral BID Saundra Shelling, MD   5 mg at 09/08/15 0916  . carvedilol (COREG) tablet 6.25 mg  6.25 mg Oral BID WC Saundra Shelling, MD   6.25 mg at 09/08/15 0913  . docusate sodium (COLACE) capsule 100 mg  100 mg Oral BID Saundra Shelling, MD   100 mg at 09/08/15 0914  . escitalopram (LEXAPRO) tablet 10 mg  10 mg Oral Daily Saundra Shelling, MD   10 mg at 09/08/15 0921  . fentaNYL (DURAGESIC - dosed mcg/hr) 50 mcg  50 mcg Transdermal Q48H Sital Mody, MD   50 mcg at 09/07/15 1326  . ferrous sulfate tablet 325 mg  325 mg Oral Q breakfast Bettey Costa, MD   325 mg at 09/08/15 0913  . furosemide (LASIX) tablet 40 mg  40 mg Oral QODAY Sital Mody, MD   40 mg at 09/07/15 1420  . furosemide (  LASIX) tablet 80 mg  80 mg Oral QODAY Bettey Costa, MD   80 mg at 09/08/15 0913  . insulin aspart (novoLOG) injection 0-5 Units  0-5 Units Subcutaneous QHS Bettey Costa, MD   0 Units at 09/07/15 2312  . insulin aspart (novoLOG) injection 0-9 Units  0-9 Units Subcutaneous TID WC Bettey Costa, MD   1 Units at 09/07/15 1627  . ipratropium-albuterol (DUONEB) 0.5-2.5 (3) MG/3ML nebulizer solution 3 mL  3 mL Nebulization Q6H Pavan Pyreddy, MD   3 mL at 09/08/15 0805  . losartan (COZAAR) tablet 25 mg  25 mg Oral Daily Saundra Shelling, MD   25 mg at 09/08/15 0914  . magic mouthwash  5 mL Oral TID Bettey Costa, MD   5 mL at 09/08/15  0912  . magnesium oxide (MAG-OX) tablet 400 mg  400 mg Oral Daily Saundra Shelling, MD   400 mg at 09/08/15 0913  . pantoprazole (PROTONIX) EC tablet 40 mg  40 mg Oral Daily Saundra Shelling, MD   40 mg at 09/08/15 0914  . piperacillin-tazobactam (ZOSYN) IVPB 3.375 g  3.375 g Intravenous 3 times per day Bettey Costa, MD   3.375 g at 09/08/15 [id number]  . rOPINIRole (REQUIP) tablet 0.5 mg  0.5 mg Oral BID Saundra Shelling, MD   0.5 mg at 09/08/15 0914  . spironolactone (ALDACTONE) tablet 25 mg  25 mg Oral Daily Bettey Costa, MD   25 mg at 09/08/15 0913  . vitamin C (ASCORBIC ACID) tablet 500 mg  500 mg Oral Daily Saundra Shelling, MD   500 mg at 09/08/15 0914  . Vitamin D (Ergocalciferol) (DRISDOL) capsule 50,000 Units  50,000 Units Oral Q7 days Saundra Shelling, MD   50,000 Units at 09/07/15 [id number]     Discharge Medications: Please see discharge summary for a list of discharge medications.  Relevant Imaging Results:  Relevant Lab Results:   Additional Information  (SSN [id number])  Lorenso Quarry Lakaisha Danish, LCSW

## 2015-09-08 NOTE — Progress Notes (Signed)
Pharmacy Antibiotic Note  [name] is a 82 y.o. female admitted on 09/06/2015 with pneumonia.  Pharmacy has been consulted for vancomycin & aztreonam dosing. Patient with cephalosporin allergy but tolerated Zosyn in ED on 3/15. Aztreonam and Vancomycin discontinued per MD as MRSA PCR negative.  Plan: Continue Zosyn 3.375 g EI q 8 hours.     Height: 5\' 7"  (170.2 cm) Weight: 173 lb 4.5 oz (78.6 kg) IBW/kg (Calculated) : 61.6  Temp (24hrs), Avg:98.6 F (37 C), Min:98 F (36.7 C), Max:99.1 F (37.3 C)   Recent Labs Lab 09/01/15 1121 09/02/15 0449 09/03/15 0419 09/06/15 2318 09/07/15 0536 09/07/15 0814  WBC 12.6* 10.3  --  14.7* 11.4*  --   CREATININE 1.07* 0.88 1.07* 1.04* 1.09*  --   LATICACIDVEN  --   --   --   --  1.3 1.0    Estimated Creatinine Clearance: 38.5 mL/min (by C-G formula based on Cr of 1.09).    Allergies  Allergen Reactions  . Ceftriaxone Swelling  . Cefuroxime Hives  . Cephalosporins Swelling and Other (See Comments)    Reaction:  Fainting/dry mouth   . Codeine Nausea And Vomiting  . Epinephrine Other (See Comments)    Reaction:  Fainting   . Morphine And Related Other (See Comments)    Reaction:  GI upset   . Buprenorphine Hcl Nausea And Vomiting  . Ciprofloxacin Itching, Swelling and Rash  . Latex Rash  . Lidocaine Rash  . Procaine Rash    Antimicrobials this admission: vancomycin 3/16 >>3/16 aztreonam 3/16 >> 3/16 One dose of piperacillin/tazobactam and azithromycin in ED Zosyn 3/16 >>  Dose adjustments this admission: n/a  Microbiology results: 3/15 BCx: NGTD x 2 3/15 UCx: Sent  MRSA PCR: negative  Thank you for allowing pharmacy to be a part of this patient's care.  Murrell Converse, PharmD Clinical Pharmacist 09/08/2015

## 2015-09-08 NOTE — Progress Notes (Signed)
Pt alert and oriented x4, no complaints of pain or discomfort.  Bed in low position, call bell within reach.  Bed alarms on and functioning.  Assessment done and charted.  Will continue to monitor and do hourly rounding throughout the shift 

## 2015-09-08 NOTE — Progress Notes (Signed)
Crackles can be heard bilaterally throughout the pts lungs, and pt's legs are more edematous this AM. IVF paused. MD Jannifer Franklin notified. Order given to stop IV fluids. Will continue to monitor.   Iran Sizer M

## 2015-09-08 NOTE — Clinical Social Work Note (Signed)
Clinical Social Work Assessment  Patient Details  [name] MRN: [id number] Date of Birth: 1924/10/23  Date of referral:  09/08/15               Reason for consult:  Discharge Planning                Permission sought to share information with:  Family Supports Permission granted to share information::  Yes, Verbal Permission Granted  Name::        Agency::     Relationship::   Calla Kicks- Husband)  Contact Information:     Housing/Transportation Living arrangements for the past 2 months:  [place] of Information:  Patient, Spouse Patient Interpreter Needed:  None Criminal Activity/Legal Involvement Pertinent to Current Situation/Hospitalization:  No - Comment as needed Significant Relationships:  Adult Children, Spouse Lives with:  Adult Children, Spouse Do you feel safe going back to the place where you live?  Yes Need for family participation in patient care:  Yes (Comment)  Care giving concerns:  Patient reports that she would like to return home and that she has [facility] services through [facility] and other support services in her home.    Social Worker assessment / plan:  CSW was consulted by PT and OT stating that patient will need SNF placement at discharge. CSW met with patient and [name] at bedside. Verbal permission granted to speak about discharge plans in front of her husband. CSW introduced herself and her role. Per patient she is not interested in SNF placement at this time. She reports that she needs to return to care for herself. Per patient she has [facility] services and other care giving services in her home daily. She reports that she has enough support at home and that she does not need SNF placement. Refused SNF list. CSW informed RN Case Manager of above. CSW is signing off. CSW is available if a CSW need were to arise.   Employment status:  Retired Forensic scientist:  Medicare PT Recommendations:  [place] / Referral to community resources:  [place]  Patient/Family's Response to care:  Patient reports that she has enough support at home to discharge home. She stated that she does not want to go to SNF at discharge.   Patient/Family's Understanding of and Emotional Response to Diagnosis, Current Treatment, and Prognosis:  Patient and her husband understands CSW's role and stated they feel that patient will be "fine" at home. She stated "I'll take it easy". Patient stated she understands why she was admitted into the hospital and does not feel that SNF placement is appropriate at this time.   Emotional Assessment Appearance:  Appears stated age Attitude/Demeanor/Rapport:   (None) Affect (typically observed):  Calm, Pleasant Orientation:  Oriented to Self, Oriented to Place, Oriented to  Time, Oriented to Situation Alcohol / Substance use:  Not Applicable Psych involvement (Current and /or in the community):  No (Comment)  Discharge Needs  Concerns to be addressed:  Discharge Planning Concerns Readmission within the last 30 days:  No Current discharge risk:  Chronically ill Barriers to Discharge:  Continued Medical Work up   [employer], LCSW 09/08/2015, 4:21 PM

## 2015-09-08 NOTE — Evaluation (Signed)
Physical Therapy Evaluation Patient Details Name: Cassidy Patyk MRN: [id number] DOB: 1925-04-15 Today's Date: 09/08/2015   History of Present Illness  presented to ER secondary to worsening confusion, lethargy; admitted with bilat, multifocal PNA.  Of note, patient with recent admission (approx 2-3 weeks prior) for respiratory failure, UTI with discharge home.  Clinical Impression  Upon evaluation, patient alert and oriented; follows all commands and demonstrates fair insight/safety awareness.  Bilat UE/LE strength and ROM grossly WFL for basic transfers and mobility; significant deficits in functional activity tolerance and overall cardiopulmonary endurance noted.  Currently requiring min assist for bed mobility; min/mod assist for sit/stand and SPT between seating surfaces, requiring HHA and use of armrest with contralateral UE to safely complete.  Patient visibly fatigued/SOB with basic transfers (desat to 88% despite 3L supplemental O2); unable to tolerate additional activity or gait training as a result. Would benefit from skilled PT to address above deficits and promote optimal return to PLOF; recommend transition to STR upon discharge from acute hospitalization, as patient lacks cardiopulmonary endurance required to safely/indep complete routine activities required to facilitate discharge to home environment.     Follow Up Recommendations SNF    Equipment Recommendations       Recommendations for Other Services       Precautions / Restrictions Precautions Precautions: Fall Precaution Comments: PPM Restrictions Weight Bearing Restrictions: No      Mobility  Bed Mobility Overal bed mobility: Needs Assistance Bed Mobility: Supine to Sit     Supine to sit: Min assist        Transfers Overall transfer level: Needs assistance Equipment used: None Transfers: Sit to/from Stand Sit to Stand: Min assist;Mod assist         General transfer comment: L HHA (patient  refused walker); broad BOS, forward flexed posture.  Visibly fatigued wtih minimal activity  Ambulation/Gait Ambulation/Gait assistance: Min assist;Mod assist Ambulation Distance (Feet): 5 Feet Assistive device: 1 person hand held assist       General Gait Details: broad BOS, forward flexed posture; requiring HHA and external stabilization throughout transfer.  Very fatigued and unable to tolerate activity beyond bed/chair at this time  Stairs            Wheelchair Mobility    Modified Rankin (Stroke Patients Only)       Balance Overall balance assessment: Needs assistance Sitting-balance support: No upper extremity supported;Feet supported Sitting balance-Leahy Scale: Good     Standing balance support: Bilateral upper extremity supported Standing balance-Leahy Scale: Fair                               Pertinent Vitals/Pain Pain Assessment: No/denies pain    Home Living Family/patient expects to be discharged to:: Private residence Living Arrangements: Spouse/significant other Available Help at Discharge: Family;Available PRN/intermittently Type of Home: House Home Access: Stairs to enter Entrance Stairs-Rails: Right Entrance Stairs-Number of Steps: 4 Home Layout: One level Home Equipment: Walker - 2 wheels;Other (comment) (3-wheeled walker)      Prior Function           Comments: Mod indep with RW for ADLs, household and limited community mobility; per medical record, recent fall with head injury prior to admission.     Hand Dominance        Extremity/Trunk Assessment   Upper Extremity Assessment: Overall WFL for tasks assessed           Lower Extremity Assessment: Overall [facility]  for tasks assessed (grossly at least 4/5 throughout)         Communication   Communication: No difficulties  Cognition Arousal/Alertness: Awake/alert Behavior During Therapy: WFL for tasks assessed/performed Overall Cognitive Status: Within  Functional Limits for tasks assessed                      General Comments      Exercises Other Exercises Other Exercises: Toilet transfer, SPT without assist device, min/mod assist; requires UE support (through armrests and HHA) throughout.  Max/dep assist for hygiene due to fatigue.      Assessment/Plan    PT Assessment Patient needs continued PT services  PT Diagnosis Difficulty walking;Generalized weakness   PT Problem List Decreased activity tolerance;Decreased balance;Decreased mobility;Decreased knowledge of use of DME;Decreased safety awareness;Decreased knowledge of precautions;Cardiopulmonary status limiting activity  PT Treatment Interventions DME instruction;Gait training;Stair training;Functional mobility training;Therapeutic activities;Therapeutic exercise;Balance training;Patient/family education   PT Goals (Current goals can be found in the Care Plan section) Acute Rehab PT Goals Patient Stated Goal: to feel better PT Goal Formulation: With patient Time For Goal Achievement: 09/22/15 Potential to Achieve Goals: Good    Frequency Min 2X/week   Barriers to discharge Decreased caregiver support      Co-evaluation               End of Session Equipment Utilized During Treatment: Gait belt;Oxygen Activity Tolerance: Patient tolerated treatment well Patient left: in chair;with call bell/phone within reach;with chair alarm set;with nursing/sitter in room           Time: [id number] PT Time Calculation (min) (ACUTE ONLY): 20 min   Charges:   PT Evaluation $PT Eval Moderate Complexity: 1 Procedure PT Treatments $Therapeutic Activity: 8-22 mins   PT G Codes:        Aran Menning H. Owens Shark, PT, DPT, NCS 09/08/2015, 10:55 AM [phone]

## 2015-09-09 LAB — URINE CULTURE: CULTURE: NO GROWTH

## 2015-09-09 LAB — CBC
HCT: 26.4 % — ABNORMAL LOW (ref 35.0–47.0)
Hemoglobin: 8.9 g/dL — ABNORMAL LOW (ref 12.0–16.0)
MCH: 30 pg (ref 26.0–34.0)
MCHC: 33.7 g/dL (ref 32.0–36.0)
MCV: 89 fL (ref 80.0–100.0)
PLATELETS: 146 10*3/uL — AB (ref 150–440)
RBC: 2.96 MIL/uL — AB (ref 3.80–5.20)
RDW: 15.4 % — ABNORMAL HIGH (ref 11.5–14.5)
WBC: 8.1 10*3/uL (ref 3.6–11.0)

## 2015-09-09 LAB — BASIC METABOLIC PANEL
Anion gap: 7 (ref 5–15)
BUN: 28 mg/dL — ABNORMAL HIGH (ref 6–20)
CALCIUM: 7.8 mg/dL — AB (ref 8.9–10.3)
CO2: 32 mmol/L (ref 22–32)
CREATININE: 0.97 mg/dL (ref 0.44–1.00)
Chloride: 94 mmol/L — ABNORMAL LOW (ref 101–111)
GFR calc non Af Amer: 51 mL/min — ABNORMAL LOW (ref >60)
GFR, EST AFRICAN AMERICAN: 59 mL/min — AB (ref >60)
Glucose, Bld: 124 mg/dL — ABNORMAL HIGH (ref 65–99)
Potassium: 3.3 mmol/L — ABNORMAL LOW (ref 3.5–5.1)
SODIUM: 133 mmol/L — AB (ref 135–145)

## 2015-09-09 LAB — GLUCOSE, CAPILLARY
Glucose-Capillary: 99 mg/dL (ref 65–99)
Glucose-Capillary: 204 mg/dL — ABNORMAL HIGH (ref 65–99)
Glucose-Capillary: 227 mg/dL — ABNORMAL HIGH (ref 65–99)
Glucose-Capillary: 342 mg/dL — ABNORMAL HIGH (ref 65–99)

## 2015-09-09 MED ORDER — METHYLPREDNISOLONE SODIUM SUCC 40 MG IJ SOLR
40.0000 mg | Freq: Every day | INTRAMUSCULAR | Status: DC
  Administered 2015-09-09 – 2015-09-13 (×5): 40 mg via INTRAVENOUS
  Filled 2015-09-09 (×5): qty 1

## 2015-09-09 MED ORDER — DOXYCYCLINE HYCLATE 100 MG PO TABS
100.0000 mg | ORAL_TABLET | Freq: Two times a day (BID) | ORAL | Status: DC
  Administered 2015-09-09 – 2015-09-13 (×8): 100 mg via ORAL
  Filled 2015-09-09 (×8): qty 1

## 2015-09-09 MED ORDER — POTASSIUM CHLORIDE CRYS ER 20 MEQ PO TBCR
40.0000 meq | EXTENDED_RELEASE_TABLET | Freq: Once | ORAL | Status: AC
  Administered 2015-09-09: 40 meq via ORAL
  Filled 2015-09-09: qty 2

## 2015-09-09 MED ORDER — BUDESONIDE 0.25 MG/2ML IN SUSP
0.2500 mg | Freq: Two times a day (BID) | RESPIRATORY_TRACT | Status: DC
  Administered 2015-09-09 – 2015-09-13 (×9): 0.25 mg via RESPIRATORY_TRACT
  Filled 2015-09-09 (×9): qty 2

## 2015-09-09 NOTE — Progress Notes (Signed)
Patient ID: [name], female   DOB: 1925/01/24, 82 y.o.   MRN: [id number] [facility] PROGRESS NOTE  [name] [id number] DOB: 03/08/1925 DOA: 09/06/2015 PCP: Tracie Harrier, MD  HPI/Subjective: Patient still with cough but unable to bring any phlegm. Patient states she's not short of breath but family states that she is. She feels a little bit better than coming in.  Objective: Filed Vitals:   09/09/15 0755 09/09/15 1215  BP: 129/52 110/42  Pulse: 77 69  Temp: 98.8 F (37.1 C) 98.1 F (36.7 C)  Resp: 17 18    Filed Weights   09/06/15 2312 09/07/15 0500  Weight: 81.647 kg (180 lb) 78.6 kg (173 lb 4.5 oz)    ROS: Review of Systems  Constitutional: Negative for fever and chills.  Eyes: Negative for blurred vision.  Respiratory: Positive for cough and shortness of breath.   Cardiovascular: Negative for chest pain.  Gastrointestinal: Negative for nausea, vomiting, abdominal pain, diarrhea and constipation.  Genitourinary: Negative for dysuria.  Musculoskeletal: Negative for joint pain.  Neurological: Negative for dizziness and headaches.   Exam: Physical Exam  Constitutional: She is oriented to person, place, and time.  HENT:  Nose: No mucosal edema.  Mouth/Throat: No oropharyngeal exudate or posterior oropharyngeal edema.  Eyes: Conjunctivae, EOM and lids are normal. Pupils are equal, round, and reactive to light.  Neck: No JVD present. Carotid bruit is not present. No edema present. No thyroid mass and no thyromegaly present.  Cardiovascular: S1 normal and S2 normal.  Exam reveals no gallop.   No murmur heard. Pulses:      Dorsalis pedis pulses are 2+ on the right side, and 2+ on the left side.  Respiratory: No respiratory distress. She has decreased breath sounds in the right middle field, the right lower field, the left middle field and the left lower field. She has wheezes in the right middle field, the right lower field, the left  middle field and the left lower field. She has no rhonchi. She has no rales.  GI: Soft. Bowel sounds are normal. There is no tenderness.  Musculoskeletal:       Right ankle: She exhibits swelling.       Left ankle: She exhibits swelling.  Lymphadenopathy:    She has no cervical adenopathy.  Neurological: She is alert and oriented to person, place, and time. No cranial nerve deficit.  Skin: Skin is warm. Nails show no clubbing.  Chronic lower extremity skin discoloration  Psychiatric: She has a normal mood and affect.      Data Reviewed: Basic Metabolic Panel:  Recent Labs Lab 09/03/15 0419 09/06/15 2318 09/07/15 0536 09/09/15 0423  NA 136 130* 133* 133*  K 3.9 4.3 3.8 3.3*  CL 99* 93* 95* 94*  CO2 29 28 31  32  GLUCOSE 118* 184* 172* 124*  BUN 31* 38* 34* 28*  CREATININE 1.07* 1.04* 1.09* 0.97  CALCIUM 8.3* 8.2* 7.9* 7.8*   Liver Function Tests:  Recent Labs Lab 09/06/15 2318  AST 21  ALT 12*  ALKPHOS 71  BILITOT 0.7  PROT 7.0  ALBUMIN 3.4*   CBC:  Recent Labs Lab 09/06/15 2318 09/07/15 0536 09/09/15 0423  WBC 14.7* 11.4* 8.1  NEUTROABS 12.8*  --   --   HGB 10.1* 9.4* 8.9*  HCT 31.1* 28.9* 26.4*  MCV 88.9 90.5 89.0  PLT 194 168 146*   CBG:  Recent Labs Lab 09/08/15 1133 09/08/15 1632 09/08/15 2116 09/09/15 0724 09/09/15 1134  GLUCAP  151* 110* 166* 99 204*    Recent Results (from the past 240 hour(s))  Urine culture     Status: None   Collection Time: 09/01/15 11:28 AM  Result Value Ref Range Status   Specimen Description URINE, CATHETERIZED  Final   Special Requests Normal  Final   Culture >=100,000 COLONIES/mL ESCHERICHIA COLI  Final   Report Status 09/03/2015 FINAL  Final   Organism ID, Bacteria ESCHERICHIA COLI  Final      Susceptibility   Escherichia coli - MIC*    AMPICILLIN >=32 RESISTANT Resistant     CEFAZOLIN <=4 SENSITIVE Sensitive     CEFTRIAXONE <=1 SENSITIVE Sensitive     CIPROFLOXACIN <=0.25 SENSITIVE Sensitive      GENTAMICIN <=1 SENSITIVE Sensitive     IMIPENEM <=0.25 SENSITIVE Sensitive     NITROFURANTOIN <=16 SENSITIVE Sensitive     TRIMETH/SULFA >=320 RESISTANT Resistant     AMPICILLIN/SULBACTAM 4 SENSITIVE Sensitive     PIP/TAZO <=4 SENSITIVE Sensitive     Extended ESBL NEGATIVE Sensitive     * >=100,000 COLONIES/mL ESCHERICHIA COLI  CULTURE, BLOOD (ROUTINE X 2) w Reflex to PCR ID Panel     Status: None   Collection Time: 09/01/15  6:18 PM  Result Value Ref Range Status   Specimen Description BLOOD RIGHT ASSIST CONTROL  Final   Special Requests   Final    BOTTLES DRAWN AEROBIC AND ANAEROBIC [place] AER 6CC ANA   Culture NO GROWTH 5 DAYS  Final   Report Status 09/06/2015 FINAL  Final  CULTURE, BLOOD (ROUTINE X 2) w Reflex to PCR ID Panel     Status: None   Collection Time: 09/01/15  6:42 PM  Result Value Ref Range Status   Specimen Description BLOOD LEFT HAND  Final   Special Requests BOTTLES DRAWN AEROBIC AND ANAEROBIC [place]  Final   Culture NO GROWTH 5 DAYS  Final   Report Status 09/06/2015 FINAL  Final  Blood Culture (routine x 2)     Status: None (Preliminary result)   Collection Time: 09/06/15 11:18 PM  Result Value Ref Range Status   Specimen Description BLOOD LEFT ANTECUBITAL  Final   Special Requests BOTTLES DRAWN AEROBIC AND ANAEROBIC 5ML  Final   Culture NO GROWTH 3 DAYS  Final   Report Status PENDING  Incomplete  Blood Culture (routine x 2)     Status: None (Preliminary result)   Collection Time: 09/06/15 11:18 PM  Result Value Ref Range Status   Specimen Description BLOOD BLOOD LEFT FOREARM  Final   Special Requests BOTTLES DRAWN AEROBIC AND ANAEROBIC 5ML  Final   Culture NO GROWTH 3 DAYS  Final   Report Status PENDING  Incomplete  Rapid Influenza A&B Antigens ([facility] only)     Status: None   Collection Time: 09/06/15 11:18 PM  Result Value Ref Range Status   Influenza A ([facility]) NEGATIVE NEGATIVE Final   Influenza B ([facility]) NEGATIVE NEGATIVE Final  Urine culture     Status: None    Collection Time: 09/06/15 11:31 PM  Result Value Ref Range Status   Specimen Description URINE, RANDOM  Final   Special Requests NONE  Final   Culture NO GROWTH 2 DAYS  Final   Report Status 09/09/2015 FINAL  Final  MRSA PCR Screening     Status: None   Collection Time: 09/07/15  4:50 AM  Result Value Ref Range Status   MRSA by PCR NEGATIVE NEGATIVE Final    Comment:  The GeneXpert MRSA Assay (FDA approved for NASAL specimens only), is one component of a comprehensive MRSA colonization surveillance program. It is not intended to diagnose MRSA infection nor to guide or monitor treatment for MRSA infections.   Culture, blood (routine x 2) Call MD if unable to obtain prior to antibiotics being given     Status: None (Preliminary result)   Collection Time: 09/07/15  8:07 AM  Result Value Ref Range Status   Specimen Description BLOOD LEFT HAND  Final   Special Requests BOTTLES DRAWN AEROBIC AND ANAEROBIC 1ML  Final   Culture NO GROWTH 2 DAYS  Final   Report Status PENDING  Incomplete  Culture, blood (routine x 2) Call MD if unable to obtain prior to antibiotics being given     Status: None (Preliminary result)   Collection Time: 09/07/15  8:13 AM  Result Value Ref Range Status   Specimen Description BLOOD RIGHT AC  Final   Special Requests   Final    BOTTLES DRAWN AEROBIC AND ANAEROBIC AER 5ML ANA 3ML   Culture NO GROWTH 2 DAYS  Final   Report Status PENDING  Incomplete    Scheduled Meds: . apixaban  5 mg Oral BID  . budesonide (PULMICORT) nebulizer solution  0.25 mg Nebulization BID  . carvedilol  6.25 mg Oral BID WC  . docusate sodium  100 mg Oral BID  . escitalopram  10 mg Oral Daily  . fentaNYL  50 mcg Transdermal Q48H  . ferrous sulfate  325 mg Oral Q breakfast  . furosemide  40 mg Oral QODAY  . furosemide  80 mg Oral QODAY  . insulin aspart  0-5 Units Subcutaneous QHS  . insulin aspart  0-9 Units Subcutaneous TID WC  . ipratropium-albuterol  3 mL  Nebulization Q6H  . losartan  25 mg Oral Daily  . magic mouthwash  5 mL Oral TID  . magnesium oxide  400 mg Oral Daily  . methylPREDNISolone (SOLU-MEDROL) injection  40 mg Intravenous Daily  . pantoprazole  40 mg Oral Daily  . piperacillin-tazobactam (ZOSYN)  IV  3.375 g Intravenous 3 times per day  . rOPINIRole  0.5 mg Oral BID  . spironolactone  25 mg Oral Daily  . vitamin C  500 mg Oral Daily  . Vitamin D (Ergocalciferol)  50,000 Units Oral Q7 days    Assessment/Plan:  1. Acute respiratory failure with hypoxia. Patient currently on 3 L of oxygen. Since the patient came in the hospital without oxygen I will try to get off oxygen prior to discharge. 2. Multifocal pneumonia bilaterally. Failed outpatient treatment with Levaquin. Currently on Zosyn. I will add doxycycline for atypical coverage. 3. Hyponatremia we'll hold off on IV fluids at this point. 4. History of congestive heart failure. No signs continue usual medications of Lasix, losartan, spironolactone and Coreg. 5. Weakness. Physical therapy recommended rehabilitation 6. Patient anticoagulated with Eliquis  Code Status:     Code Status Orders        Start     Ordered   09/07/15 0441  Full code   Continuous     09/07/15 0440    Code Status History    Date Active Date Inactive Code Status Order ID Comments User Context   09/01/2015  3:33 PM 09/03/2015  7:07 PM Full Code [id number]  Epifanio Lesches, MD ED   03/21/2015  6:26 AM 03/22/2015  7:39 PM Full Code [id number]  Harrie Foreman, MD Inpatient     Family Communication: Family  at bedside Disposition Plan: Likely out to rehabilitation  Antibiotics:  Zosyn  And doxycycline  Time spent: 25 minutes  Loletha Grayer  [facility]

## 2015-09-09 NOTE — Plan of Care (Signed)
Problem: Activity: Goal: Ability to tolerate increased activity will improve Outcome: Not Progressing Very weak, Physical therapy working with pt  Problem: Health Behavior/Discharge Planning: Goal: Ability to manage health-related needs will improve Outcome: Progressing To be dc to home with home health.  Problem: Safety: Goal: Ability to remain free from injury will improve Outcome: Progressing Fall precautions in place

## 2015-09-09 NOTE — Plan of Care (Signed)
Problem: SLP Dysphagia Goals Goal: Misc Dysphagia Goal Pt will safely tolerate po diet of least restrictive consistency w/ no overt s/s of aspiration noted by Staff/pt/family x3 sessions.    

## 2015-09-10 LAB — GLUCOSE, CAPILLARY
GLUCOSE-CAPILLARY: 215 mg/dL — AB (ref 65–99)
GLUCOSE-CAPILLARY: 308 mg/dL — AB (ref 65–99)
GLUCOSE-CAPILLARY: 302 mg/dL — AB (ref 65–99)
Glucose-Capillary: 237 mg/dL — ABNORMAL HIGH (ref 65–99)

## 2015-09-10 MED ORDER — INSULIN GLARGINE 100 UNIT/ML ~~LOC~~ SOLN
15.0000 [IU] | Freq: Every day | SUBCUTANEOUS | Status: DC
  Administered 2015-09-10 – 2015-09-13 (×4): 15 [IU] via SUBCUTANEOUS
  Filled 2015-09-10 (×5): qty 0.15

## 2015-09-10 NOTE — Plan of Care (Signed)
Problem: Pain Managment: Goal: General experience of comfort will improve Outcome: Progressing Patient has expressed no pain or discomfort this shift.  Problem: Physical Regulation: Goal: Ability to maintain clinical measurements within normal limits will improve Outcome: Progressing Patient ambulating to and from bathroom with +1 assist.

## 2015-09-10 NOTE — Progress Notes (Signed)
Patient ID: [name], female   DOB: 12/19/1924, 82 y.o.   MRN: [id number] [facility] PROGRESS NOTE  Kamyla Moncur [id number] DOB: 1925-03-24 DOA: 09/06/2015 PCP: Tracie Harrier, MD  HPI/Subjective: Patient still with shortness of breath and cough. She still feels very weak. This morning she was sweating a lot and her sheets had a be changed.  Objective: Filed Vitals:   09/10/15 0454 09/10/15 1216  BP: 129/74 138/69  Pulse: 74 71  Temp: 97.5 F (36.4 C) 97.8 F (36.6 C)  Resp: 18 18    Filed Weights   09/06/15 2312 09/07/15 0500  Weight: 81.647 kg (180 lb) 78.6 kg (173 lb 4.5 oz)    ROS: Review of Systems  Constitutional: Positive for diaphoresis. Negative for fever and chills.  Eyes: Negative for blurred vision.  Respiratory: Positive for cough and shortness of breath.   Cardiovascular: Negative for chest pain.  Gastrointestinal: Negative for nausea, vomiting, abdominal pain, diarrhea and constipation.  Genitourinary: Negative for dysuria.  Musculoskeletal: Negative for joint pain.  Neurological: Negative for dizziness and headaches.   Exam: Physical Exam  Constitutional: She is oriented to person, place, and time.  HENT:  Nose: No mucosal edema.  Mouth/Throat: No oropharyngeal exudate or posterior oropharyngeal edema.  Eyes: Conjunctivae, EOM and lids are normal. Pupils are equal, round, and reactive to light.  Neck: No JVD present. Carotid bruit is not present. No edema present. No thyroid mass and no thyromegaly present.  Cardiovascular: S1 normal and S2 normal.  Exam reveals no gallop.   No murmur heard. Pulses:      Dorsalis pedis pulses are 2+ on the right side, and 2+ on the left side.  Respiratory: No respiratory distress. She has decreased breath sounds in the right middle field, the right lower field, the left middle field and the left lower field. She has wheezes in the right middle field, the right lower field, the left  middle field and the left lower field. She has no rhonchi. She has no rales.  GI: Soft. Bowel sounds are normal. There is no tenderness.  Musculoskeletal:       Right ankle: She exhibits swelling.       Left ankle: She exhibits swelling.  Lymphadenopathy:    She has no cervical adenopathy.  Neurological: She is alert and oriented to person, place, and time. No cranial nerve deficit.  Skin: Skin is warm. Nails show no clubbing.  Chronic lower extremity skin discoloration  Psychiatric: She has a normal mood and affect.      Data Reviewed: Basic Metabolic Panel:  Recent Labs Lab 09/06/15 2318 09/07/15 0536 09/09/15 0423  NA 130* 133* 133*  K 4.3 3.8 3.3*  CL 93* 95* 94*  CO2 28 31 32  GLUCOSE 184* 172* 124*  BUN 38* 34* 28*  CREATININE 1.04* 1.09* 0.97  CALCIUM 8.2* 7.9* 7.8*   Liver Function Tests:  Recent Labs Lab 09/06/15 2318  AST 21  ALT 12*  ALKPHOS 71  BILITOT 0.7  PROT 7.0  ALBUMIN 3.4*   CBC:  Recent Labs Lab 09/06/15 2318 09/07/15 0536 09/09/15 0423  WBC 14.7* 11.4* 8.1  NEUTROABS 12.8*  --   --   HGB 10.1* 9.4* 8.9*  HCT 31.1* 28.9* 26.4*  MCV 88.9 90.5 89.0  PLT 194 168 146*   CBG:  Recent Labs Lab 09/09/15 1134 09/09/15 1648 09/09/15 2038 09/10/15 0726 09/10/15 1210  GLUCAP 204* 227* 342* 237* 215*    Recent Results (from the  past 240 hour(s))  Urine culture     Status: None   Collection Time: 09/01/15 11:28 AM  Result Value Ref Range Status   Specimen Description URINE, CATHETERIZED  Final   Special Requests Normal  Final   Culture >=100,000 COLONIES/mL ESCHERICHIA COLI  Final   Report Status 09/03/2015 FINAL  Final   Organism ID, Bacteria ESCHERICHIA COLI  Final      Susceptibility   Escherichia coli - MIC*    AMPICILLIN >=32 RESISTANT Resistant     CEFAZOLIN <=4 SENSITIVE Sensitive     CEFTRIAXONE <=1 SENSITIVE Sensitive     CIPROFLOXACIN <=0.25 SENSITIVE Sensitive     GENTAMICIN <=1 SENSITIVE Sensitive     IMIPENEM  <=0.25 SENSITIVE Sensitive     NITROFURANTOIN <=16 SENSITIVE Sensitive     TRIMETH/SULFA >=320 RESISTANT Resistant     AMPICILLIN/SULBACTAM 4 SENSITIVE Sensitive     PIP/TAZO <=4 SENSITIVE Sensitive     Extended ESBL NEGATIVE Sensitive     * >=100,000 COLONIES/mL ESCHERICHIA COLI  CULTURE, BLOOD (ROUTINE X 2) w Reflex to PCR ID Panel     Status: None   Collection Time: 09/01/15  6:18 PM  Result Value Ref Range Status   Specimen Description BLOOD RIGHT ASSIST CONTROL  Final   Special Requests   Final    BOTTLES DRAWN AEROBIC AND ANAEROBIC [place] AER 6CC ANA   Culture NO GROWTH 5 DAYS  Final   Report Status 09/06/2015 FINAL  Final  CULTURE, BLOOD (ROUTINE X 2) w Reflex to PCR ID Panel     Status: None   Collection Time: 09/01/15  6:42 PM  Result Value Ref Range Status   Specimen Description BLOOD LEFT HAND  Final   Special Requests BOTTLES DRAWN AEROBIC AND ANAEROBIC [place]  Final   Culture NO GROWTH 5 DAYS  Final   Report Status 09/06/2015 FINAL  Final  Blood Culture (routine x 2)     Status: None (Preliminary result)   Collection Time: 09/06/15 11:18 PM  Result Value Ref Range Status   Specimen Description BLOOD LEFT ANTECUBITAL  Final   Special Requests BOTTLES DRAWN AEROBIC AND ANAEROBIC 5ML  Final   Culture NO GROWTH 4 DAYS  Final   Report Status PENDING  Incomplete  Blood Culture (routine x 2)     Status: None (Preliminary result)   Collection Time: 09/06/15 11:18 PM  Result Value Ref Range Status   Specimen Description BLOOD BLOOD LEFT FOREARM  Final   Special Requests BOTTLES DRAWN AEROBIC AND ANAEROBIC 5ML  Final   Culture NO GROWTH 4 DAYS  Final   Report Status PENDING  Incomplete  Rapid Influenza A&B Antigens ([facility] only)     Status: None   Collection Time: 09/06/15 11:18 PM  Result Value Ref Range Status   Influenza A ([facility]) NEGATIVE NEGATIVE Final   Influenza B ([facility]) NEGATIVE NEGATIVE Final  Urine culture     Status: None   Collection Time: 09/06/15 11:31 PM  Result  Value Ref Range Status   Specimen Description URINE, RANDOM  Final   Special Requests NONE  Final   Culture NO GROWTH 2 DAYS  Final   Report Status 09/09/2015 FINAL  Final  MRSA PCR Screening     Status: None   Collection Time: 09/07/15  4:50 AM  Result Value Ref Range Status   MRSA by PCR NEGATIVE NEGATIVE Final    Comment:        The GeneXpert MRSA Assay (FDA approved for NASAL  specimens only), is one component of a comprehensive MRSA colonization surveillance program. It is not intended to diagnose MRSA infection nor to guide or monitor treatment for MRSA infections.   Culture, blood (routine x 2) Call MD if unable to obtain prior to antibiotics being given     Status: None (Preliminary result)   Collection Time: 09/07/15  8:07 AM  Result Value Ref Range Status   Specimen Description BLOOD LEFT HAND  Final   Special Requests BOTTLES DRAWN AEROBIC AND ANAEROBIC 1ML  Final   Culture NO GROWTH 3 DAYS  Final   Report Status PENDING  Incomplete  Culture, blood (routine x 2) Call MD if unable to obtain prior to antibiotics being given     Status: None (Preliminary result)   Collection Time: 09/07/15  8:13 AM  Result Value Ref Range Status   Specimen Description BLOOD RIGHT AC  Final   Special Requests   Final    BOTTLES DRAWN AEROBIC AND ANAEROBIC AER 5ML ANA 3ML   Culture NO GROWTH 3 DAYS  Final   Report Status PENDING  Incomplete    Scheduled Meds: . apixaban  5 mg Oral BID  . budesonide (PULMICORT) nebulizer solution  0.25 mg Nebulization BID  . carvedilol  6.25 mg Oral BID WC  . docusate sodium  100 mg Oral BID  . doxycycline  100 mg Oral Q12H  . escitalopram  10 mg Oral Daily  . fentaNYL  50 mcg Transdermal Q48H  . ferrous sulfate  325 mg Oral Q breakfast  . furosemide  40 mg Oral QODAY  . furosemide  80 mg Oral QODAY  . insulin aspart  0-5 Units Subcutaneous QHS  . insulin aspart  0-9 Units Subcutaneous TID WC  . insulin glargine  15 Units Subcutaneous Daily  .  ipratropium-albuterol  3 mL Nebulization Q6H  . losartan  25 mg Oral Daily  . magic mouthwash  5 mL Oral TID  . magnesium oxide  400 mg Oral Daily  . methylPREDNISolone (SOLU-MEDROL) injection  40 mg Intravenous Daily  . pantoprazole  40 mg Oral Daily  . piperacillin-tazobactam (ZOSYN)  IV  3.375 g Intravenous 3 times per day  . rOPINIRole  0.5 mg Oral BID  . spironolactone  25 mg Oral Daily  . vitamin C  500 mg Oral Daily  . Vitamin D (Ergocalciferol)  50,000 Units Oral Q7 days    Assessment/Plan:  1. Acute respiratory failure with hypoxia. Patient currently on 2 L of oxygen. Since the patient came in the hospital without oxygen I will try to get off oxygen prior to discharge. Check room air pulse ox tomorrow morning 2. Multifocal pneumonia bilaterally. Failed outpatient treatment with Levaquin. Currently on Zosyn and doxycycline. I added Solu-Medrol and budesonide nebulizers yesterday with the wheezing. 3. Hyponatremia we'll hold off on IV fluids at this point. 4. History of congestive heart failure. No signs continue usual medications of Lasix, losartan, spironolactone and Coreg. 5. Weakness. Physical therapy recommended rehabilitation 6. Patient anticoagulated with Eliquis  Code Status:     Code Status Orders        Start     Ordered   09/07/15 0441  Full code   Continuous     09/07/15 0440    Code Status History    Date Active Date Inactive Code Status Order ID Comments User Context   09/01/2015  3:33 PM 09/03/2015  7:07 PM Full Code [id number]  Epifanio Lesches, MD ED   03/21/2015  6:26  AM 03/22/2015  7:39 PM Full Code [id number]  Harrie Foreman, MD Inpatient     Family Communication: Husband at bedside Disposition Plan: Likely out to rehabilitation. Probably earliest would be Tuesday  Antibiotics:  Zosyn  doxycycline  Time spent: 24 minutes  Loletha Grayer  [facility]

## 2015-09-10 NOTE — Progress Notes (Signed)
LCSW met with patient breifly as RN stated patient has reconsidered to go to SNF. LCSW dropped off a list of nursing facilities for patient and family members. LCSW will be contacted when family members arrive. LCSW will send out via the [facility] current FL2 and CSW initial referral to [place] for a bed offer ( as per Transport planner [place]).  [employer] LCSW [phone]

## 2015-09-11 ENCOUNTER — Encounter: Payer: Self-pay | Admitting: *Deleted

## 2015-09-11 DIAGNOSIS — I509 Heart failure, unspecified: Secondary | ICD-10-CM

## 2015-09-11 LAB — BASIC METABOLIC PANEL
ANION GAP: 6 (ref 5–15)
BUN: 37 mg/dL — ABNORMAL HIGH (ref 6–20)
CALCIUM: 8.6 mg/dL — AB (ref 8.9–10.3)
CO2: 35 mmol/L — AB (ref 22–32)
CREATININE: 0.89 mg/dL (ref 0.44–1.00)
Chloride: 95 mmol/L — ABNORMAL LOW (ref 101–111)
GFR calc Af Amer: >60 mL/min (ref >60)
GFR, EST NON AFRICAN AMERICAN: 56 mL/min — AB (ref >60)
Glucose, Bld: 134 mg/dL — ABNORMAL HIGH (ref 65–99)
Potassium: 3.5 mmol/L (ref 3.5–5.1)
SODIUM: 136 mmol/L (ref 135–145)

## 2015-09-11 LAB — CULTURE, BLOOD (ROUTINE X 2)
CULTURE: NO GROWTH
Culture: NO GROWTH

## 2015-09-11 LAB — CBC
HCT: 29 % — ABNORMAL LOW (ref 35.0–47.0)
Hemoglobin: 9.6 g/dL — ABNORMAL LOW (ref 12.0–16.0)
MCH: 29.5 pg (ref 26.0–34.0)
MCHC: 33.1 g/dL (ref 32.0–36.0)
MCV: 89.1 fL (ref 80.0–100.0)
PLATELETS: 189 10*3/uL (ref 150–440)
RBC: 3.25 MIL/uL — AB (ref 3.80–5.20)
RDW: 14.9 % — ABNORMAL HIGH (ref 11.5–14.5)
WBC: 9.2 10*3/uL (ref 3.6–11.0)

## 2015-09-11 LAB — GLUCOSE, CAPILLARY
GLUCOSE-CAPILLARY: 102 mg/dL — AB (ref 65–99)
Glucose-Capillary: 143 mg/dL — ABNORMAL HIGH (ref 65–99)
Glucose-Capillary: 276 mg/dL — ABNORMAL HIGH (ref 65–99)
Glucose-Capillary: 281 mg/dL — ABNORMAL HIGH (ref 65–99)

## 2015-09-11 NOTE — Progress Notes (Signed)
CSW met with patient at bedside. She reports that she is unsure if she wants to go to SNF at discharge. She reports that she wants to discuss some test with the MD before making a decision. She reports if she goes to SNF at discharge she would like to go to [place] or [place]. Patient granted CSW verbal permission to send SNF referral to [facility]. Awaiting bed offers. CSW will continue to follow and assist.   Ernest Pine, MSW, [place] Work Department [phone]

## 2015-09-11 NOTE — Progress Notes (Signed)
Pulmonary Individual Treatment Plan  Patient Details  [name] MRN: [id number] Date of Birth: 1924-09-21 Referring Provider:  Dr. Wendie Chess  Initial Encounter Date:  06/11/15  Visit Diagnosis: Acute on chronic congestive heart failure, unspecified congestive heart failure type [facility])  Patient's Home Medications on Admission: No current facility-administered medications for this visit. No current outpatient prescriptions on file.  Facility-Administered Medications Ordered in Other Visits:  .  apixaban (ELIQUIS) tablet 5 mg, 5 mg, Oral, BID, Saundra Shelling, MD, 5 mg at 09/11/15 0940 .  budesonide (PULMICORT) nebulizer solution 0.25 mg, 0.25 mg, Nebulization, BID, Loletha Grayer, MD, 0.25 mg at 09/11/15 0849 .  carvedilol (COREG) tablet 6.25 mg, 6.25 mg, Oral, BID WC, Pavan Pyreddy, MD, 6.25 mg at 09/11/15 4098 .  docusate sodium (COLACE) capsule 100 mg, 100 mg, Oral, BID, Saundra Shelling, MD, 100 mg at 09/11/15 0940 .  doxycycline (VIBRA-TABS) tablet 100 mg, 100 mg, Oral, Q12H, Loletha Grayer, MD, 100 mg at 09/11/15 0940 .  escitalopram (LEXAPRO) tablet 10 mg, 10 mg, Oral, Daily, Saundra Shelling, MD, 10 mg at 09/11/15 0940 .  fentaNYL (DURAGESIC - dosed mcg/hr) 50 mcg, 50 mcg, Transdermal, Q48H, Bettey Costa, MD, 50 mcg at 09/11/15 1202 .  ferrous sulfate tablet 325 mg, 325 mg, Oral, Q breakfast, Bettey Costa, MD, 325 mg at 09/11/15 0832 .  furosemide (LASIX) tablet 40 mg, 40 mg, Oral, QODAY, Sital Mody, MD, 40 mg at 09/11/15 0940 .  furosemide (LASIX) tablet 80 mg, 80 mg, Oral, QODAY, Bettey Costa, MD, 80 mg at 09/10/15 0913 .  insulin aspart (novoLOG) injection 0-5 Units, 0-5 Units, Subcutaneous, QHS, Bettey Costa, MD, 100 Units at 09/10/15 2124 .  insulin aspart (novoLOG) injection 0-9 Units, 0-9 Units, Subcutaneous, TID WC, Bettey Costa, MD, 1 Units at 09/11/15 1202 .  insulin glargine (LANTUS) injection 15 Units, 15 Units, Subcutaneous, Daily, Loletha Grayer, MD, 15 Units at  09/11/15 [phone] .  ipratropium-albuterol (DUONEB) 0.5-2.5 (3) MG/3ML nebulizer solution 3 mL, 3 mL, Nebulization, Q6H, Pavan Pyreddy, MD, 3 mL at 09/11/15 1316 .  losartan (COZAAR) tablet 25 mg, 25 mg, Oral, Daily, Pavan Pyreddy, MD, 25 mg at 09/11/15 0940 .  magic mouthwash, 5 mL, Oral, TID, Bettey Costa, MD, 5 mL at 09/11/15 0942 .  magnesium oxide (MAG-OX) tablet 400 mg, 400 mg, Oral, Daily, Saundra Shelling, MD, 400 mg at 09/11/15 0940 .  methylPREDNISolone sodium succinate (SOLU-MEDROL) 40 mg/mL injection 40 mg, 40 mg, Intravenous, Daily, Loletha Grayer, MD, 40 mg at 09/11/15 0941 .  pantoprazole (PROTONIX) EC tablet 40 mg, 40 mg, Oral, Daily, Saundra Shelling, MD, 40 mg at 09/11/15 0940 .  piperacillin-tazobactam (ZOSYN) IVPB 3.375 g, 3.375 g, Intravenous, 3 times per day, Bettey Costa, MD, 3.375 g at 09/11/15 1331 .  rOPINIRole (REQUIP) tablet 0.5 mg, 0.5 mg, Oral, BID, Saundra Shelling, MD, 0.5 mg at 09/11/15 0941 .  spironolactone (ALDACTONE) tablet 25 mg, 25 mg, Oral, Daily, Bettey Costa, MD, 25 mg at 09/11/15 0940 .  vitamin C (ASCORBIC ACID) tablet 500 mg, 500 mg, Oral, Daily, Pavan Pyreddy, MD, 500 mg at 09/11/15 0940 .  Vitamin D (Ergocalciferol) (DRISDOL) capsule 50,000 Units, 50,000 Units, Oral, Q7 days, Saundra Shelling, MD, 50,000 Units at 09/07/15 4782  Past Medical History: Past Medical History  Diagnosis Date  . CHF (congestive heart failure) ([place])   . Diabetes mellitus without complication ([place])   . Hypertension   . Chronic back pain   . Cancer ([place])     skin ca  Tobacco Use: History  Smoking status  . Never Smoker   Smokeless tobacco  . Never Used    Labs: Recent Review Flowsheet Data    Labs for ITP Cardiac and Pulmonary Rehab Latest Ref Rng 03/04/2012 03/20/2015 09/06/2015   Cholestrol 0-200 mg/dL 101 - -   LDLCALC 0-100 mg/dL 60 - -   HDL 40-60 mg/dL 23(L) - -   Trlycerides 0-200 mg/dL 90 - -   Hemoglobin A1c 4.0 - 6.0 % 7.7(H) 7.0(H) -   HCO3 21.0 - 28.0 mEq/L - -  34.4(H)   O2SAT - - - PENDING         POCT Glucose      05/29/15 1000 05/31/15 1000 07/26/15 1432       POCT Blood Glucose   Pre-Exercise 206 mg/dL  Muffin, grits, and coffee; Fasting Glucose 144 201 mg/dL      Post-Exercise 189 mg/dL 170 mg/dL      Pre-Exercise #3   223 mg/dL     Post-Exercise #3   158 mg/dL        ADL UCSD:     ADL UCSD      05/23/15 1100       ADL UCSD   ADL Phase Entry     SOB Score total 14     Rest 0     Walk 0     Stairs 1     Bath 1     Dress 1     Shop 2        Pulmonary Function Assessment:     Pulmonary Function Assessment - 05/23/15 1100    Initial Spirometry Results   FVC% 47 %   FEV1% 20 %   FEV1/FVC Ratio 100   Breath   Bilateral Breath Sounds Clear;Decreased      Exercise Target Goals:    Exercise Program Goal: Individual exercise prescription set with THRR, safety & activity barriers. Participant demonstrates ability to understand and report RPE using BORG scale, to self-measure pulse accurately, and to acknowledge the importance of the exercise prescription.  Exercise Prescription Goal: Starting with aerobic activity 30 plus minutes a day, 3 days per week for initial exercise prescription. Provide home exercise prescription and guidelines that participant acknowledges understanding prior to discharge.  Activity Barriers & Risk Stratification:     Activity Barriers & Cardiac Risk Stratification - 05/23/15 1100    Activity Barriers & Cardiac Risk Stratification   Activity Barriers Assistive Device;Deconditioning;Muscular Weakness   Cardiac Risk Stratification Moderate      6 Minute Walk:     6 Minute Walk      05/23/15 1449       6 Minute Walk   Phase Initial     Distance 575 feet     Walk Time 6 minutes     RPE 11     Perceived Dyspnea  2     Symptoms No     Resting HR 74 bpm     Resting BP 118/72 mmHg     Max Ex. HR 77 bpm     Max Ex. BP 124/72 mmHg        Initial Exercise Prescription:      Initial Exercise Prescription - 05/23/15 1400    Date of Initial Exercise Prescription   Date 05/23/15   Treadmill   MPH 1.5   Grade 0   Minutes 10   Recumbant Bike   Level 2   RPM 40   Watts 20  Minutes 10   NuStep   Level 2   Watts 40   Minutes 10   Arm Ergometer   Level 1   Watts 10   Minutes 10   Recumbant Elliptical   Level 1   RPM 40   Watts 20   Minutes 10   REL-XR   Level 2   Watts 40   Minutes 10   T5 Nustep   Level 1   Watts 15   Minutes 10   Biostep-RELP   Level 2   Watts 40   Minutes 10   Prescription Details   Frequency (times per week) 3   Duration Progress to 30 minutes of continuous aerobic without signs/symptoms of physical distress   Intensity   THRR REST +  30   Ratings of Perceived Exertion 11-15   Perceived Dyspnea 2-4   Progression   Progression Continue progressive overload as per policy without signs/symptoms or physical distress.   Resistance Training   Training Prescription Yes   Weight 2   Reps 10-15      Perform Capillary Blood Glucose checks as needed.  Exercise Prescription Changes:     Exercise Prescription Changes      05/29/15 1000 05/31/15 1000 07/26/15 1000 07/31/15 1000 08/09/15 1300   Exercise Review   Progression  No No Yes Yes   Response to Exercise   Blood Pressure (Admit) 120/68 mmHg    120/70 mmHg   Blood Pressure (Exercise) 124/64 mmHg    118/62 mmHg   Blood Pressure (Exit) 122/64 mmHg    90/62 mmHg   Heart Rate (Admit) 77 bpm    75 bpm   Heart Rate (Exercise) 97 bpm    91 bpm   Heart Rate (Exit) 73 bpm    75 bpm   Oxygen Saturation (Admit) 98 %    94 %   Oxygen Saturation (Exercise) 91 %    92 %   Oxygen Saturation (Exit) 91 %    90 %   Rating of Perceived Exertion (Exercise) 13    11   Perceived Dyspnea (Exercise) 0    0   Symptoms Omitted Arm Ergometer due to it being painful to back. This is due to chronic pain.  Both knees were sore from fall Some back and knee pain residual from her fall in  December Complained of some tiredness today. Completed the exercise with intervals and took rests when she needed them  Chronic pain   Comments Patient's first day of class. The initial exercise prescription was discussed with the patient. Exercise machine usage and safety were also discussed with the patient.  Patient had sore knees from a fall so her exercise prescription was adjusted slightly. She was able to perform the same workloads, but had to go slower on while stepping on the NS and BS.  Patient was able to resume previous workloads after being out for over a month which was encouraging to her.  Reviewed individualized exercise prescription and made increases per departmental policy. Exercise increases were discussed with the patient and they were able to perform the new work loads without issue (no signs or symptoms).  York Cerise is making small progressions. Her back and knee pain and overall fragility make more aggressive progression not possible and not practical. York Cerise is using time intervals to exercise for longer periods and to build her endurance. She cannot exercise continuously for more than 10-15 minutes and our first goals is to steadily increase her time and  build her endurance.    Duration Progress to 30 minutes of continuous aerobic without signs/symptoms of physical distress Progress to 30 minutes of continuous aerobic without signs/symptoms of physical distress Progress to 30 minutes of continuous aerobic without signs/symptoms of physical distress Progress to 30 minutes of continuous aerobic without signs/symptoms of physical distress Progress to 30 minutes of continuous aerobic without signs/symptoms of physical distress   Intensity Rest + 30 Rest + 30 Rest + 30 Rest + 30 Rest + 30   Progression   Progression Continue progressive overload as per policy without signs/symptoms or physical distress. Continue progressive overload as per policy without signs/symptoms or physical distress. Continue  progressive overload as per policy without signs/symptoms or physical distress. Continue progressive overload as per policy without signs/symptoms or physical distress. Continue progressive overload as per policy without signs/symptoms or physical distress.   Resistance Training   Training Prescription (read-only) Yes Yes Yes Yes Yes   Weight (read-only) '1 1 1 1 1   '$ Reps (read-only) 10-15 10-15 10-15 10-15 10-15   NuStep   Level (read-only) '2 2 2 2 2   '$ Watts (read-only) 40 40 '20 20 20   '$ Minutes (read-only) '10 10 10 12 12   '$ Biostep-RELP   Level (read-only) '2 2 2 2 2   '$ Watts (read-only) 40 40 '18 18 20   '$ Minutes (read-only) '10 10 10 10 10     '$ 08/14/15 1000 08/16/15 1000 08/30/15 0600       Exercise Review   Progression  Yes Yes     Response to Exercise   Blood Pressure (Admit)   136/62 mmHg     Blood Pressure (Exercise)   122/62 mmHg     Blood Pressure (Exit)   120/62 mmHg     Heart Rate (Admit)   76 bpm     Heart Rate (Exercise)   78 bpm     Heart Rate (Exit)   72 bpm     Oxygen Saturation (Admit)   91 %     Oxygen Saturation (Exercise)   95 %     Oxygen Saturation (Exit)   90 %     Rating of Perceived Exertion (Exercise)   11     Perceived Dyspnea (Exercise)   0     Symptoms Chronic pain Chronic pain Chronic Pain     Comments Maintaining time increases and doing well on the machines.  York Cerise is showing progress in her stamina and can tolerate longer exercise times on the machines.       Duration Progress to 30 minutes of continuous aerobic without signs/symptoms of physical distress Progress to 30 minutes of continuous aerobic without signs/symptoms of physical distress Progress to 45 minutes of aerobic exercise without signs/symptoms of physical distress     Intensity Rest + 30 Rest + 30 THRR unchanged     Progression   Progression Continue progressive overload as per policy without signs/symptoms or physical distress. Continue progressive overload as per policy without signs/symptoms  or physical distress. Continue progressive overload as per policy without signs/symptoms or physical distress.     Resistance Training   Training Prescription (read-only) Yes Yes      Weight (read-only) 1 1      Reps (read-only) 10-15 10-15      NuStep   Level   2     Watts   40     Minutes   20     Level (read-only) 2 2  Watts (read-only) 20 20      Minutes (read-only) 12 12      Biostep-RELP   Level   2     Watts   20     Minutes   20     Level (read-only) 2 2      Watts (read-only) 20 20      Minutes (read-only) 10 15         Exercise Comments:   Discharge Exercise Prescription (Final Exercise Prescription Changes):     Exercise Prescription Changes - 08/30/15 0600    Exercise Review   Progression Yes   Response to Exercise   Blood Pressure (Admit) 136/62 mmHg   Blood Pressure (Exercise) 122/62 mmHg   Blood Pressure (Exit) 120/62 mmHg   Heart Rate (Admit) 76 bpm   Heart Rate (Exercise) 78 bpm   Heart Rate (Exit) 72 bpm   Oxygen Saturation (Admit) 91 %   Oxygen Saturation (Exercise) 95 %   Oxygen Saturation (Exit) 90 %   Rating of Perceived Exertion (Exercise) 11   Perceived Dyspnea (Exercise) 0   Symptoms Chronic Pain   Duration Progress to 45 minutes of aerobic exercise without signs/symptoms of physical distress   Intensity THRR unchanged   Progression   Progression Continue progressive overload as per policy without signs/symptoms or physical distress.   NuStep   Level 2   Watts 40   Minutes 20   Biostep-RELP   Level 2   Watts 20   Minutes 20       Nutrition:  Target Goals: Understanding of nutrition guidelines, daily intake of sodium '1500mg'$ , cholesterol '200mg'$ , calories 30% from fat and 7% or less from saturated fats, daily to have 5 or more servings of fruits and vegetables.  Biometrics:     Pre Biometrics - 05/23/15 1453    Pre Biometrics   Height '5\' 7"'$  (1.702 m)   Weight 165 lb 12.8 oz (75.206 kg)   Waist Circumference 37 inches    Hip Circumference 43 inches   Waist to Hip Ratio 0.86 %   BMI (Calculated) 26       Nutrition Therapy Plan and Nutrition Goals:     Nutrition Therapy & Goals - 05/23/15 1100    Nutrition Therapy   Diet Prefers not to meet with the dietitian      Nutrition Discharge: Rate Your Plate Scores:   Psychosocial: Target Goals: Acknowledge presence or absence of depression, maximize coping skills, provide positive support system. Participant is able to verbalize types and ability to use techniques and skills needed for reducing stress and depression.  Initial Review & Psychosocial Screening:     Initial Psych Review & Screening - 05/23/15 1100    Family Dynamics   Good Support System? Yes   Comments Ms Sarnowski has good support from her husband and states she has no depression.   Barriers   Psychosocial barriers to participate in program There are no identifiable barriers or psychosocial needs.;The patient should benefit from training in stress management and relaxation.   Screening Interventions   Interventions Encouraged to exercise      Quality of Life Scores:     Quality of Life - 05/23/15 1100    Quality of Life Scores   Health/Function Pre 12.2 %   Socioeconomic Pre 20.57 %   Psych/Spiritual Pre 16.29 %   Family Pre 14.4 %   GLOBAL Pre 15.09 %      PHQ-9:     Recent Review Flowsheet  Data    Depression screen [facility] 2/9 05/23/2015 04/10/2015   Decreased Interest 1 3   Down, Depressed, Hopeless 2 0   PHQ - 2 Score 3 3   Altered sleeping 3 3   Tired, decreased energy 2 3   Change in appetite 0 0   Feeling bad or failure about yourself  2 3   Trouble concentrating 0 0   Moving slowly or fidgety/restless 1 0   Suicidal thoughts 1 0   PHQ-9 Score 12 12   Difficult doing work/chores Somewhat difficult Somewhat difficult      Psychosocial Evaluation and Intervention:     Psychosocial Evaluation - 05/31/15 1100    Psychosocial Evaluation & Interventions    Interventions Encouraged to exercise with the program and follow exercise prescription;Relaxation education;Stress management education   Comments Counselor met with Ms. Trettin today for initial psychosocial evaluation.  She is an 82 year old who has congestive heart failure and was recommended to attend this program by her Dr.  Jamesetta So. Carlis Abbott has a strong support system with a spouse of 15 years and several adult children who check in on her by phone daily.  She also has help that comes in 4 hours/day and is a part of a faith community.  Ms. Conde  struggles with chronic back pain subsequent to surgery in 2009.  She reports this pain prevents sound sleep for her, even though she is taking medication for pain at bedtime.  Ms. Lofaso denies a history of depression or anxiety or current symptoms, other than she admits to "worrying a lot."  she is on Lexapro 10 mg. for this and states it helps some.  She reports her stressors are "worrying" about her spouse who has multiple health issues, including back problems, and her 84 year old son who was paralyzed 7 years ago and lives in [place].  Ms. Zettlemoyer has goals to increase the strength in her legs and be able to "reitre" her walker and get back to her cane for support.  Counselor encouraged her to consistently exercise to accomplish this goal.  Also, she will benefit from the stress management and relaxation psychoeducational components of this program.     Continued Psychosocial Services Needed Yes  Ms. Brabender will benefit from consistent exercise, and psychoeducation on relaxation and stress management particularly.  Counselor will follow up with Ms. Carlis Abbott on these.       Psychosocial Re-Evaluation:     Psychosocial Re-Evaluation      08/02/15 1101 08/14/15 1057 08/23/15 1000       Psychosocial Re-Evaluation   Interventions Encouraged to attend Cardiac Rehabilitation for the exercise       Comments Dizziness at times with exercise so Jolan also said she has chronic  back pain that limits her that she wishes she doesn't have but she reports she has learned to live with it.  Counselor met with Ms. Haynie for follow up evaluation.  She reports some increased strength since beginning this class.  She continues to worry a great deal about her spouse's health which has decilined.  She continues to have good support with help that comes in 3 times each week.  Her daughter from [place] will be coming this Wednesday to  support her spouse due to his medical procedures scheduled for this Friday.  Ms. Harb states her grandson is getting married April 1st and she is hoping she and spouse will be well enough to attend.  Counselor will continue to  follow with Ms. Geier in the future.   Ms Virginia is very stressed with her husbands health. His injections in his back have not helped, and he now has a blocked caroid artery. I will speak with our mental health councilor to check with Ms Eifler. Through this all, she still attends [facility] regularly, and it seems to help her emotionally.       Education: Education Goals: Education classes will be provided on a weekly basis, covering required topics. Participant will state understanding/return demonstration of topics presented.  Learning Barriers/Preferences:     Learning Barriers/Preferences - 05/23/15 1100    Learning Barriers/Preferences   Learning Barriers None   Learning Preferences Group Instruction;Individual Instruction;Pictoral;Skilled Demonstration;Verbal Instruction;Video;Written Material      Education Topics: Initial Evaluation Education: - Verbal, written and demonstration of respiratory meds, RPE/PD scales, oximetry and breathing techniques. Instruction on use of nebulizers and MDIs: cleaning and proper use, rinsing mouth with steroid doses and importance of monitoring MDI activations.          Pulmonary Rehab from 08/16/2015 in [facility] Cardiac and Pulmonary Rehab   Date  05/23/15   Educator  LB   Instruction  Review Code  2- meets goals/outcomes      General Nutrition Guidelines/Fats and Fiber: -Group instruction provided by verbal, written material, models and posters to present the general guidelines for heart healthy nutrition. Gives an explanation and review of dietary fats and fiber.      Pulmonary Rehab from 08/16/2015 in [facility] Cardiac and Pulmonary Rehab   Date  07/31/15 Marcelina Morel attended 05/29/15]   Educator  CR   Instruction Review Code  2- meets goals/outcomes      Controlling Sodium/Reading Food Labels: -Group verbal and written material supporting the discussion of sodium use in heart healthy nutrition. Review and explanation with models, verbal and written materials for utilization of the food label.   Exercise Physiology & Risk Factors: - Group verbal and written instruction with models to review the exercise physiology of the cardiovascular system and associated critical values. Details cardiovascular disease risk factors and the goals associated with each risk factor.      Pulmonary Rehab from 08/16/2015 in [facility] Cardiac and Pulmonary Rehab   Date  05/31/15   Educator  SW   Instruction Review Code  2- meets goals/outcomes      Aerobic Exercise & Resistance Training: - Gives group verbal and written discussion on the health impact of inactivity. On the components of aerobic and resistive training programs and the benefits of this training and how to safely progress through these programs.   Flexibility, Balance, General Exercise Guidelines: - Provides group verbal and written instruction on the benefits of flexibility and balance training programs. Provides general exercise guidelines with specific guidelines to those with heart or lung disease. Demonstration and skill practice provided.   Stress Management: - Provides group verbal and written instruction about the health risks of elevated stress, cause of high stress, and healthy ways to reduce stress.      Pulmonary Rehab from  08/16/2015 in [facility] Cardiac and Pulmonary Rehab   Date  08/16/15   Educator  [facility]   Instruction Review Code  2- meets goals/outcomes      Depression: - Provides group verbal and written instruction on the correlation between heart/lung disease and depressed mood, treatment options, and the stigmas associated with seeking treatment.   Exercise & Equipment Safety: - Individual verbal instruction and demonstration of equipment use and safety with use of  the equipment.      Pulmonary Rehab from 08/16/2015 in [facility] Cardiac and Pulmonary Rehab   Date  05/29/15   Educator  LB   Instruction Review Code  2- meets goals/outcomes      Infection Prevention: - Provides verbal and written material to individual with discussion of infection control including proper hand washing and proper equipment cleaning during exercise session.      Pulmonary Rehab from 08/16/2015 in [facility] Cardiac and Pulmonary Rehab   Date  05/29/15   Educator  LB   Instruction Review Code  2- meets goals/outcomes      Falls Prevention: - Provides verbal and written material to individual with discussion of falls prevention and safety.      Pulmonary Rehab from 08/16/2015 in [facility] Cardiac and Pulmonary Rehab   Date  05/23/15   Educator  LB   Instruction Review Code  2- meets goals/outcomes      Diabetes: - Individual verbal and written instruction to review signs/symptoms of diabetes, desired ranges of glucose level fasting, after meals and with exercise. Advice that pre and post exercise glucose checks will be done for 3 sessions at entry of program.   Chronic Lung Diseases: - Group verbal and written instruction to review new updates, new respiratory medications, new advancements in procedures and treatments. Provide informative websites and "800" numbers of self-education.   Lung Procedures: - Group verbal and written instruction to describe testing methods done to diagnose lung disease. Review the outcome of test  results. Describe the treatment choices: Pulmonary Function Tests, ABGs and oximetry.   Energy Conservation: - Provide group verbal and written instruction for methods to conserve energy, plan and organize activities. Instruct on pacing techniques, use of adaptive equipment and posture/positioning to relieve shortness of breath.   Triggers: - Group verbal and written instruction to review types of environmental controls: home humidity, furnaces, filters, dust mite/pet prevention, HEPA vacuums. To discuss weather changes, air quality and the benefits of nasal washing.   Exacerbations: - Group verbal and written instruction to provide: warning signs, infection symptoms, calling MD promptly, preventive modes, and value of vaccinations. Review: effective airway clearance, coughing and/or vibration techniques. Create an Sport and exercise psychologist.   Oxygen: - Individual and group verbal and written instruction on oxygen therapy. Includes supplement oxygen, available portable oxygen systems, continuous and intermittent flow rates, oxygen safety, concentrators, and Medicare reimbursement for oxygen.   Respiratory Medications: - Group verbal and written instruction to review medications for lung disease. Drug class, frequency, complications, importance of spacers, rinsing mouth after steroid MDI's, and proper cleaning methods for nebulizers.   AED/CPR: - Group verbal and written instruction with the use of models to demonstrate the basic use of the AED with the basic ABC's of resuscitation.   Breathing Retraining: - Provides individuals verbal and written instruction on purpose, frequency, and proper technique of diaphragmatic breathing and pursed-lipped breathing. Applies individual practice skills.      Pulmonary Rehab from 08/16/2015 in [facility]   Date  05/23/15   Educator  LB   Instruction Review Code  2- meets goals/outcomes      Anatomy and Physiology of the Lungs: - Group  verbal and written instruction with the use of models to provide basic lung anatomy and physiology related to function, structure and complications of lung disease.   Heart Failure: - Group verbal and written instruction on the basics of heart failure: signs/symptoms, treatments, explanation of ejection fraction, enlarged heart and cardiomyopathy.  Sleep Apnea: - Individual verbal and written instruction to review Obstructive Sleep Apnea. Review of risk factors, methods for diagnosing and types of masks and machines for OSA.   Anxiety: - Provides group, verbal and written instruction on the correlation between heart/lung disease and anxiety, treatment options, and management of anxiety.   Relaxation: - Provides group, verbal and written instruction about the benefits of relaxation for patients with heart/lung disease. Also provides patients with examples of relaxation techniques.   Knowledge Questionnaire Score:    Personal Goals and Risk Factors at Admission:     Personal Goals and Risk Factors at Admission - 05/23/15 1100    Core Components/Risk Factors/Patient Goals on Admission   Sedentary Yes   Intervention (read-only) While in program, learn and follow the exercise prescription taught. Start at a low level workload and increase workload after able to maintain previous level for 30 minutes. Increase time before increasing intensity.  Ms Fleischer's goal is to increase her stamina for walking. She has a co-pay and therefore we will work her into FF with her [employer].   Improve shortness of breath with ADL's No   Develop more efficient breathing techniques such as purse lipped breathing and diaphragmatic breathing; and practicing self-pacing with activity Yes   Intervention (read-only) While in program, learn and utilize the specific breathing techniques taught to you. Continue to practice and use the techniques as needed.  Instructed Ms Handyside on PLB. When she was  in Physical Therapy, she learned this technique .   Diabetes Yes   Goal Blood glucose control identified by blood glucose values, HgbA1C. Participant verbalizes understanding of the signs/symptoms of hyper/hypo glycemia, proper foot care and importance of medication and nutrition plan for blood glucose control.   Intervention (read-only) Provide nutrition & aerobic exercise along with prescribed medications to achieve blood glucose in normal ranges: Fasting 65-99 mg/dL   Hypertension Yes   Goal Participant will see blood pressure controlled within the values of 140/10mm/Hg or within value directed by their physician.   Intervention (read-only) Provide nutrition & aerobic exercise along with prescribed medications to achieve BP 140/90 or less.   Understand more about Heart/Pulmonary Disease. Yes   Intervention While in program utilize professionals for any questions, and attend the education sessions. Great websites to use are www.americanheart.org or www.lung.org for reliable information.  Ms Mathenia has CHF and would like to learn more about the disease.      Personal Goals and Risk Factors Review:      Goals and Risk Factor Review      05/29/15 1000 05/31/15 1000 07/31/15 1038 08/02/15 1000 08/02/15 1056   Core Components/Risk Factors/Patient Goals Review   Personal Goals Review Develop more efficient breathing techniques such as purse lipped breathing and diaphragmatic breathing and practicing self-pacing with activity. Increase Aerobic Exercise and Physical Activity   Diabetes;Hypertension   Increase Aerobic Exercise and Physical Activity (read-only)   Goals Progress/Improvement seen   Yes Yes  Yes   Comments  Plan to move Ms Blazina into [employer] in about a week - she has a copay of $20.00 with each visit; she has done well on the NS and BioStep with acceptable vital signs. He husband may join FF as well. Ms Pinkus has returned to class after an extended absense due to a fall and knee  injury. She stated that her knee is tolerating the exercise well. Options for [employer] classes were discussed with Ms Teem as she has a $32  copay.  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  Explained to Ms Wiederhold that there is a waiting list for the 1:15 FF; she will continue in [place] and wait for the opening. Jyla said she was dizzy again today but after blood pressure was 110/70 with pulse 76 and non fasting fingerstick blood sugar was 192. (Had breakfast this am).Sheree was able to go exercise on the Biostep sitting down after her dizziness passed after 360 cc of water.    Understand more about Heart/Pulmonary Disease (read-only)   Goals Progress/Improvement seen      Yes   Comments     I asked Shavonta if she is on a fluid restriction since some people with Heart failure are. She reported no so given 360 cc water per above after she c/o dizziness. Jobe Gibbon denies vertigo history.    Breathing Techniques (read-only)   Comments Ms Meharg using PLB, more for pain than shortness of breath.       Diabetes (read-only)   Progress seen towards goals     Yes   Comments     Blood sugar after breakfast today when she c./o dizziness was 192. Lou had breakfast this am.    Hypertension (read-only)   Progress seen toward goals     Yes   Comments     Blood pressure today when she c/o dizziness was 110/70     08/02/15 1121 08/09/15 1101 08/23/15 1000       Core Components/Risk Factors/Patient Goals Review   Personal Goals Review Diabetes;Hypertension Improve shortness of breath with ADL's;Develop more efficient breathing techniques such as purse lipped breathing and diaphragmatic breathing and practicing self-pacing with activity. Sedentary     Review   Ms Fiorillo has improved with her exercise to 20mn/ NS and BioStep. She is stronger with her walk in and out and even requested an increase in her Level on the BioStep.     Expected  Outcomes   Continue increasing resistance on the NS and BioStep within the next 2 weeks.     Improve shortness of breath with ADL's (read-only)   Goals Progress/Improvement seen   Met      Comments  LYork Cerisestates she does not experience SOB symptoms. She stated that she does tire easily. She does stop when she is tired and will slow down and pace herself to prevent herself from gettin overtired.  She hopes to see an increase in  stamina as she continues with the exercise prescription with the program.      Breathing Techniques (read-only)   Goals Progress/Improvement seen   Yes      Comments  Reviewed the technique for PLB with LYork Cerisetoday.  She stared and demonstrated good PLB technique.  Reminded her to use this throughout her exercise and when exerting herself in any activity.      Diabetes (read-only)   Goal Blood glucose control identified by blood glucose values, HgbA1C. Participant verbalizes understanding of the signs/symptoms of hyper/hypo glycemia, proper foot care and importance of medication and nutrition plan for blood glucose control.       Progress seen towards goals Yes       Comments Lou sysates her A1c is around 7.4%.  She has maintained good control with ther blood sugars and is aware of her meds and nutriton/exercise requirements to help maintain good Blood sugar contro.  Hypertension (read-only)   Goal Participant will see blood pressure controlled within the values of 140/31m/Hg or within value directed by their physician.       Progress seen toward goals Yes       Comments BP ranges are in good ranges by chart review.  LYork Cerisetalked about keeping up with her weight every day and when to call her MD about 3 pounds weight gain. "I take Lasix 80 mg one day and then 40 mg the next"    LYork Ceriseis aware of what she needs to do by keeping up with er meds/nutrition and exercise to help keep her BP in noraml range.           Personal Goals Discharge (Final Personal Goals and Risk Factors  Review):      Goals and Risk Factor Review - 08/23/15 1000    Core Components/Risk Factors/Patient Goals Review   Personal Goals Review Sedentary   Review Ms CElbehas improved with her exercise to 245m/ NS and BioStep. She is stronger with her walk in and out and even requested an increase in her Level on the BioStep.   Expected Outcomes Continue increasing resistance on the NS and BioStep within the next 2 weeks.      ITP Comments:     ITP Comments      06/20/15 1133 07/26/15 1000 07/26/15 1053 07/31/15 1000 07/31/15 1023   ITP Comments Ms ClMackertast attended 05/31/2015. She has had some knee problems, but plans to return the first of the year. Ms ClFeaganeturned to Lu[employer]. [employer]. Companyfter a month of resting her lt knee; she exercised 32 minutes and omitted weights; a good first day back to exercise. Personal and exercise goals expected to be met in 32 more sessions. Progress on specific individualized goals will be charted in patient's ITP. Upon completion of the program the patient will be comfortable managing exercise goals and progression on their own.  Ms ClDampiereft early today; she was tired and had episodes of dizziness; glucous level checked 174; Ms ClKesters under a lot of stress with family. Personal and exercise goals expected to be met in 31 more sessions. Progress on specific individualized goals will be charted in patient's ITP. Upon completion of the program the patient will be comfortable managing exercise goals and progression on their own.      08/02/15 1102 08/09/15 1100 08/14/15 1035 08/16/15 1058 08/30/15 1614   ITP Comments Azarria said she was dizzy again today but after blood pressure was 110/70 with pulse 76 and non fasting fingerstick blood sugar was 192. (Had breakfast this am).MaSukias able to go exercise on the Biostep sitting down after her dizziness passed after 360 cc of water.  Expected to meet program and personal goals by end of program in 29 more sessions. Personal and  exercise goals expected to be met in 28 more sessions. Progress on specific individualized goals will be charted in patient's ITP. Upon completion of the program the patient will be comfortable managing exercise goals and progression on their own.  Personal and exercise goals expected to be met in 27 more sessions. Progress on specific individualized goals will be charted in patient's ITP. Upon completion of the program the patient will be comfortable managing exercise goals and progression on their own.  Ms ClPetrenkoalled and states she fell and hit her head on the bed and would not be able to exercise this week.      Comments: 30 Day  Review

## 2015-09-11 NOTE — Progress Notes (Signed)
Physical Therapy Treatment Patient Details [name] MRN: [id number] DOB: [birth year] Today's Date: 09/11/2015    History of Present Illness presented to ER secondary to worsening confusion, lethargy; admitted with bilat, multifocal PNA.  Of note, patient with recent admission (approx 2-3 weeks prior) for respiratory failure, UTI with discharge home.    PT Comments    Patient demonstrates significant improvement in tolerance for activity in this session completing multiple sit to stands, standing level activities. She is limited in ambulation due to lack of portable IV in the room, however she tolerates short bout of ambulation well. She continues to demonstrate LE weakness as demonstrated by difficulty with standing (requiring use of UEs and multiple attempts) likely indicating she is at high risk for falling. Given her clinical scenario, she continues to appear to be a good candidate for SNF placement after medically stable for discharge to increase her LE strength and aerobic tolerance for activity.   Follow Up Recommendations  SNF     Equipment Recommendations  None recommended by PT    Recommendations for Other Services       Precautions / Restrictions Precautions Precautions: Fall Precaution Comments: PPM Restrictions Weight Bearing Restrictions: No    Mobility  Bed Mobility Overal bed mobility: Independent       Supine to sit: HOB elevated     General bed mobility comments: No deficits in bed mobility observed in this session.   Transfers Overall transfer level: Needs assistance Equipment used: Rolling walker (2 wheeled) Transfers: Sit to/from Stand Sit to Stand: Min guard;Min assist         General transfer comment: Multiple sit to stands completed with RW, patient required cuing to push into walker rather than pull. LE weakness noted by difficulty coming fully erect.   Ambulation/Gait Ambulation/Gait assistance: Min guard Ambulation  Distance (Feet): 3 Feet Assistive device: Rolling walker (2 wheeled)     Gait velocity interpretation: Below normal speed for age/gender General Gait Details: Improved gait mechanics, likely closer to her normal in this session. Unable to ambulate further than chair as there was no portable IV pole in room.    Stairs            Wheelchair Mobility    Modified Rankin (Stroke Patients Only)       Balance Overall balance assessment: Needs assistance Sitting-balance support: Feet supported Sitting balance-Leahy Scale: Good     Standing balance support: Bilateral upper extremity supported Standing balance-Leahy Scale: Fair                      Cognition Arousal/Alertness: Awake/alert Behavior During Therapy: WFL for tasks assessed/performed Overall Cognitive Status: Within Functional Limits for tasks assessed                      Exercises General Exercises - Lower Extremity Long Arc Quad: AROM;Both;10 reps Hip Flexion/Marching: AROM;Both;20 reps;Standing Heel Raises: AROM;20 reps;Both;Standing Mini-Sqauts: AROM;Both;20 reps;Standing Other Exercises Other Exercises: Patient assisted with transfer to commode with use of RW, no deficits observed in gait.    General Comments        Pertinent Vitals/Pain Pain Assessment: No/denies pain    Home Living                      Prior Function            PT Goals (current goals can now be found in the care plan section) Acute Rehab  PT Goals Patient Stated Goal: to feel better PT Goal Formulation: With patient Time For Goal Achievement: 09/22/15 Potential to Achieve Goals: Good Progress towards PT goals: Progressing toward goals    Frequency  Min 2X/week    PT Plan Current plan remains appropriate    Co-evaluation             End of Session Equipment Utilized During Treatment: Gait belt;Oxygen Activity Tolerance: Patient tolerated treatment well Patient left: in chair;with  call bell/phone within reach;with chair alarm set;with family/visitor present     Time: [id number] PT Time Calculation (min) (ACUTE ONLY): 25 min  Charges:  $Therapeutic Exercise: 8-22 mins $Therapeutic Activity: 8-22 mins                    G Codes:      Kerman Passey, PT, DPT    09/11/2015, 4:53 PM

## 2015-09-11 NOTE — Progress Notes (Addendum)
CSW met with patient and her husband to discuss discharge plans. Accepted bed offer at [facility]. CSW contacted [facility]-admissions to confirm patient's bed. CSW will continue to follow and assist.   Ernest Pine, MSW, [place] Work Department [phone]

## 2015-09-11 NOTE — Progress Notes (Signed)
Pharmacy Antibiotic Note  [name] is a 82 y.o. female admitted on 09/06/2015 with pneumonia.  Pharmacy has been consulted for Zosyn  dosing.  Plan: Will continue Zosyn 3.375 g IV q8 hours. Patient is also currently on Doxycyline 100 mg PO BID. Pharmacy to follow per consult.   Height: 5\' 7"  (170.2 cm) Weight: 173 lb 4.5 oz (78.6 kg) IBW/kg (Calculated) : 61.6  Temp (24hrs), Avg:97.7 F (36.5 C), Min:97.3 F (36.3 C), Max:98 F (36.7 C)   Recent Labs Lab 09/06/15 2318 09/07/15 0536 09/07/15 0814 09/09/15 0423 09/11/15 0559  WBC 14.7* 11.4*  --  8.1 9.2  CREATININE 1.04* 1.09*  --  0.97 0.89  LATICACIDVEN  --  1.3 1.0  --   --     Estimated Creatinine Clearance: 47.2 mL/min (by C-G formula based on Cr of 0.89).    Allergies  Allergen Reactions  . Ceftriaxone Swelling  . Cefuroxime Hives  . Cephalosporins Swelling and Other (See Comments)    Reaction:  Fainting/dry mouth   . Codeine Nausea And Vomiting  . Epinephrine Other (See Comments)    Reaction:  Fainting   . Morphine And Related Other (See Comments)    Reaction:  GI upset   . Buprenorphine Hcl Nausea And Vomiting  . Ciprofloxacin Itching, Swelling and Rash  . Latex Rash  . Lidocaine Rash  . Procaine Rash    Antimicrobials this admission: Vancomycin  3/16>>  Aztreonam  >>   Dose adjustments this admission:   Microbiology results:  BCx:   UCx:    Sputum:    MRSA PCR: negative   Thank you for allowing pharmacy to be a part of this patient's care.  Monaye Blackie D 09/11/2015 9:53 AM

## 2015-09-11 NOTE — Progress Notes (Signed)
CSW presented bed offers to patient. Patient requested CSW come back later today so that she may discuss options with her husband and daughter. CSW left SNF list with patient with bed offers highlighted.  CSW will continue to follow and assist.   Ernest Pine, MSW, [place] Work Department [phone]

## 2015-09-11 NOTE — Progress Notes (Signed)
Patient ID: [name], female   DOB: [birth year], 82 y.o.   MRN: [id number] [facility] PROGRESS NOTE  [name] [id number] DOB: [birth year] DOA: 09/06/2015 PCP: Tracie Harrier, MD  HPI/Subjective: Patient still with shortness of breath and cough. She still feels very weak. PT recommends STR/SNF  Objective: Filed Vitals:   09/11/15 0831 09/11/15 1218  BP: 138/56 142/63  Pulse: 77 70  Temp: 97.3 F (36.3 C) 97.5 F (36.4 C)  Resp: 18 18    Filed Weights   09/06/15 2312 09/07/15 0500  Weight: 81.647 kg (180 lb) 78.6 kg (173 lb 4.5 oz)    ROS: Review of Systems  Constitutional: Negative for fever, chills and diaphoresis.  Eyes: Negative for blurred vision.  Respiratory: Positive for cough and shortness of breath.   Cardiovascular: Negative for chest pain.  Gastrointestinal: Negative for nausea, vomiting, abdominal pain, diarrhea and constipation.  Genitourinary: Negative for dysuria.  Musculoskeletal: Negative for joint pain.  Neurological: Negative for dizziness and headaches.   Exam: Physical Exam  Constitutional: She is oriented to person, place, and time.  HENT:  Nose: No mucosal edema.  Mouth/Throat: No oropharyngeal exudate or posterior oropharyngeal edema.  Eyes: Conjunctivae, EOM and lids are normal. Pupils are equal, round, and reactive to light.  Neck: No JVD present. Carotid bruit is not present. No edema present. No thyroid mass and no thyromegaly present.  Cardiovascular: S1 normal and S2 normal.  Exam reveals no gallop.   No murmur heard. Pulses:      Dorsalis pedis pulses are 2+ on the right side, and 2+ on the left side.  Respiratory: No respiratory distress. She has decreased breath sounds in the right middle field, the right lower field, the left middle field and the left lower field. She has wheezes in the right middle field, the right lower field, the left middle field and the left lower field. She has no rhonchi. She  has no rales.  GI: Soft. Bowel sounds are normal. There is no tenderness.  Musculoskeletal:       Right ankle: She exhibits swelling.       Left ankle: She exhibits swelling.  Lymphadenopathy:    She has no cervical adenopathy.  Neurological: She is alert and oriented to person, place, and time. No cranial nerve deficit.  Skin: Skin is warm. Nails show no clubbing.  Chronic lower extremity skin discoloration  Psychiatric: She has a normal mood and affect.      Data Reviewed: Basic Metabolic Panel:  Recent Labs Lab 09/06/15 2318 09/07/15 0536 09/09/15 0423 09/11/15 0559  NA 130* 133* 133* 136  K 4.3 3.8 3.3* 3.5  CL 93* 95* 94* 95*  CO2 28 31 32 35*  GLUCOSE 184* 172* 124* 134*  BUN 38* 34* 28* 37*  CREATININE 1.04* 1.09* 0.97 0.89  CALCIUM 8.2* 7.9* 7.8* 8.6*   Liver Function Tests:  Recent Labs Lab 09/06/15 2318  AST 21  ALT 12*  ALKPHOS 71  BILITOT 0.7  PROT 7.0  ALBUMIN 3.4*   CBC:  Recent Labs Lab 09/06/15 2318 09/07/15 0536 09/09/15 0423 09/11/15 0559  WBC 14.7* 11.4* 8.1 9.2  NEUTROABS 12.8*  --   --   --   HGB 10.1* 9.4* 8.9* 9.6*  HCT 31.1* 28.9* 26.4* 29.0*  MCV 88.9 90.5 89.0 89.1  PLT 194 168 146* 189   CBG:  Recent Labs Lab 09/10/15 1210 09/10/15 1714 09/10/15 2032 09/11/15 0742 09/11/15 1123  GLUCAP 215* 308* 302* 102* 143*  Recent Results (from the past 240 hour(s))  CULTURE, BLOOD (ROUTINE X 2) w Reflex to PCR ID Panel     Status: None   Collection Time: 09/01/15  6:18 PM  Result Value Ref Range Status   Specimen Description BLOOD RIGHT ASSIST CONTROL  Final   Special Requests   Final    BOTTLES DRAWN AEROBIC AND ANAEROBIC [facility] AER 6CC ANA   Culture NO GROWTH 5 DAYS  Final   Report Status 09/06/2015 FINAL  Final  CULTURE, BLOOD (ROUTINE X 2) w Reflex to PCR ID Panel     Status: None   Collection Time: 09/01/15  6:42 PM  Result Value Ref Range Status   Specimen Description BLOOD LEFT HAND  Final   Special Requests  BOTTLES DRAWN AEROBIC AND ANAEROBIC [facility]  Final   Culture NO GROWTH 5 DAYS  Final   Report Status 09/06/2015 FINAL  Final  Blood Culture (routine x 2)     Status: None   Collection Time: 09/06/15 11:18 PM  Result Value Ref Range Status   Specimen Description BLOOD LEFT ANTECUBITAL  Final   Special Requests BOTTLES DRAWN AEROBIC AND ANAEROBIC 5ML  Final   Culture NO GROWTH 5 DAYS  Final   Report Status 09/11/2015 FINAL  Final  Blood Culture (routine x 2)     Status: None   Collection Time: 09/06/15 11:18 PM  Result Value Ref Range Status   Specimen Description BLOOD BLOOD LEFT FOREARM  Final   Special Requests BOTTLES DRAWN AEROBIC AND ANAEROBIC 5ML  Final   Culture NO GROWTH 5 DAYS  Final   Report Status 09/11/2015 FINAL  Final  Rapid Influenza A&B Antigens ([place] only)     Status: None   Collection Time: 09/06/15 11:18 PM  Result Value Ref Range Status   Influenza A ([place]) NEGATIVE NEGATIVE Final   Influenza B ([facility]) NEGATIVE NEGATIVE Final  Urine culture     Status: None   Collection Time: 09/06/15 11:31 PM  Result Value Ref Range Status   Specimen Description URINE, RANDOM  Final   Special Requests NONE  Final   Culture NO GROWTH 2 DAYS  Final   Report Status 09/09/2015 FINAL  Final  MRSA PCR Screening     Status: None   Collection Time: 09/07/15  4:50 AM  Result Value Ref Range Status   MRSA by PCR NEGATIVE NEGATIVE Final    Comment:        The GeneXpert MRSA Assay (FDA approved for NASAL specimens only), is one component of a comprehensive MRSA colonization surveillance program. It is not intended to diagnose MRSA infection nor to guide or monitor treatment for MRSA infections.   Culture, blood (routine x 2) Call MD if unable to obtain prior to antibiotics being given     Status: None (Preliminary result)   Collection Time: 09/07/15  8:07 AM  Result Value Ref Range Status   Specimen Description BLOOD LEFT HAND  Final   Special Requests BOTTLES DRAWN AEROBIC AND  ANAEROBIC 1ML  Final   Culture NO GROWTH 4 DAYS  Final   Report Status PENDING  Incomplete  Culture, blood (routine x 2) Call MD if unable to obtain prior to antibiotics being given     Status: None (Preliminary result)   Collection Time: 09/07/15  8:13 AM  Result Value Ref Range Status   Specimen Description BLOOD RIGHT [facility]  Final   Special Requests   Final    BOTTLES DRAWN AEROBIC AND ANAEROBIC  AER 5ML ANA 3ML   Culture NO GROWTH 4 DAYS  Final   Report Status PENDING  Incomplete    Scheduled Meds: . apixaban  5 mg Oral BID  . budesonide (PULMICORT) nebulizer solution  0.25 mg Nebulization BID  . carvedilol  6.25 mg Oral BID WC  . docusate sodium  100 mg Oral BID  . doxycycline  100 mg Oral Q12H  . escitalopram  10 mg Oral Daily  . fentaNYL  50 mcg Transdermal Q48H  . ferrous sulfate  325 mg Oral Q breakfast  . furosemide  40 mg Oral QODAY  . furosemide  80 mg Oral QODAY  . insulin aspart  0-5 Units Subcutaneous QHS  . insulin aspart  0-9 Units Subcutaneous TID WC  . insulin glargine  15 Units Subcutaneous Daily  . ipratropium-albuterol  3 mL Nebulization Q6H  . losartan  25 mg Oral Daily  . magic mouthwash  5 mL Oral TID  . magnesium oxide  400 mg Oral Daily  . methylPREDNISolone (SOLU-MEDROL) injection  40 mg Intravenous Daily  . pantoprazole  40 mg Oral Daily  . piperacillin-tazobactam (ZOSYN)  IV  3.375 g Intravenous 3 times per day  . rOPINIRole  0.5 mg Oral BID  . spironolactone  25 mg Oral Daily  . vitamin C  500 mg Oral Daily  . Vitamin D (Ergocalciferol)  50,000 Units Oral Q7 days    Assessment/Plan:  1. Acute respiratory failure with hypoxia. Patient currently on 2 L of oxygen. Still requiring 2 liters O2 - sats 91%, will continue weaning efforts 2. Multifocal pneumonia bilaterally. Failed outpatient treatment with Levaquin. Currently on Zosyn and doxycycline. Also on Solu-Medrol and budesonide nebulizers due to wheezing. 3. Hyponatremia: resolved 4. History of  congestive heart failure. No signs continue usual medications of Lasix, losartan, spironolactone and Coreg. 5. Weakness. Physical therapy recommended rehabilitation 6. Patient anticoagulated with Eliquis  Code Status: Full Code Family Communication:  Disposition Plan: STR/SNF. Possibly tomorrow/wednesday depending on clinical condition and placement search  Antibiotics:  Zosyn  doxycycline  Time spent: 25 minutes  [facility], [name]  [facility] Hospitalists

## 2015-09-11 NOTE — Care Management Important Message (Signed)
Important Message  Patient Details  [name] MRN: [id number] Date of Birth: 01/08/1925   Medicare Important Message Given:  Yes    [name] A Ngoc Detjen 09/11/2015, 1:49 PM

## 2015-09-12 ENCOUNTER — Encounter
Admission: RE | Admit: 2015-09-12 | Discharge: 2015-09-12 | Disposition: A | Discharge status: Home / Self Care | Payer: Medicare Other | Source: Ambulatory Visit | Attending: Internal Medicine | Admitting: Internal Medicine

## 2015-09-12 ENCOUNTER — Telehealth: Payer: Self-pay | Admitting: Respiratory Therapy

## 2015-09-12 ENCOUNTER — Inpatient Hospital Stay: Payer: Medicare Other

## 2015-09-12 DIAGNOSIS — E119 Type 2 diabetes mellitus without complications: Secondary | ICD-10-CM | POA: Insufficient documentation

## 2015-09-12 DIAGNOSIS — K439 Ventral hernia without obstruction or gangrene: Secondary | ICD-10-CM | POA: Insufficient documentation

## 2015-09-12 LAB — BASIC METABOLIC PANEL
Anion gap: 5 (ref 5–15)
BUN: 38 mg/dL — AB (ref 6–20)
CALCIUM: 8.4 mg/dL — AB (ref 8.9–10.3)
CO2: 37 mmol/L — ABNORMAL HIGH (ref 22–32)
CREATININE: 0.93 mg/dL (ref 0.44–1.00)
Chloride: 92 mmol/L — ABNORMAL LOW (ref 101–111)
GFR, EST NON AFRICAN AMERICAN: 53 mL/min — AB (ref >60)
Glucose, Bld: 84 mg/dL (ref 65–99)
Potassium: 3.6 mmol/L (ref 3.5–5.1)
SODIUM: 134 mmol/L — AB (ref 135–145)

## 2015-09-12 LAB — GLUCOSE, CAPILLARY
GLUCOSE-CAPILLARY: 70 mg/dL (ref 65–99)
GLUCOSE-CAPILLARY: 353 mg/dL — AB (ref 65–99)
GLUCOSE-CAPILLARY: 305 mg/dL — AB (ref 65–99)
Glucose-Capillary: 127 mg/dL — ABNORMAL HIGH (ref 65–99)
Glucose-Capillary: 383 mg/dL — ABNORMAL HIGH (ref 65–99)

## 2015-09-12 LAB — CULTURE, BLOOD (ROUTINE X 2)
CULTURE: NO GROWTH
CULTURE: NO GROWTH

## 2015-09-12 LAB — CBC
HCT: 29.4 % — ABNORMAL LOW (ref 35.0–47.0)
Hemoglobin: 9.8 g/dL — ABNORMAL LOW (ref 12.0–16.0)
MCH: 28.9 pg (ref 26.0–34.0)
MCHC: 33.2 g/dL (ref 32.0–36.0)
MCV: 86.8 fL (ref 80.0–100.0)
PLATELETS: 212 10*3/uL (ref 150–440)
RBC: 3.39 MIL/uL — ABNORMAL LOW (ref 3.80–5.20)
RDW: 15.1 % — AB (ref 11.5–14.5)
WBC: 9.7 10*3/uL (ref 3.6–11.0)

## 2015-09-12 NOTE — Progress Notes (Signed)
Patient ID: [name], female   DOB: 11/21/1924, 82 y.o.   MRN: [id number] [facility] PROGRESS NOTE  [name] [id number] DOB: 1925/02/05 DOA: 09/06/2015 PCP: Tracie Harrier, MD  HPI/Subjective: Improving shortness of breath and cough. She still feels very weak. PT recommends STR/SNF   Objective: Filed Vitals:   09/12/15 0824 09/12/15 1113  BP: 151/64 132/64  Pulse: 77 76  Temp: 97.7 F (36.5 C) 98.1 F (36.7 C)  Resp:  18    Filed Weights   09/06/15 2312 09/07/15 0500  Weight: 81.647 kg (180 lb) 78.6 kg (173 lb 4.5 oz)    ROS: Review of Systems  Constitutional: Negative for fever, chills and diaphoresis.  Eyes: Negative for blurred vision.  Respiratory: Positive for cough and shortness of breath.   Cardiovascular: Negative for chest pain.  Gastrointestinal: Negative for nausea, vomiting, abdominal pain, diarrhea and constipation.  Genitourinary: Negative for dysuria.  Musculoskeletal: Negative for joint pain.  Neurological: Negative for dizziness and headaches.   Exam: Physical Exam  Constitutional: She is oriented to person, place, and time.  HENT:  Nose: No mucosal edema.  Mouth/Throat: No oropharyngeal exudate or posterior oropharyngeal edema.  Eyes: Conjunctivae, EOM and lids are normal. Pupils are equal, round, and reactive to light.  Neck: No JVD present. Carotid bruit is not present. No edema present. No thyroid mass and no thyromegaly present.  Cardiovascular: S1 normal and S2 normal.  Exam reveals no gallop.   No murmur heard. Pulses:      Dorsalis pedis pulses are 2+ on the right side, and 2+ on the left side.  Respiratory: No respiratory distress. She has decreased breath sounds in the right lower field and the left lower field. She has no wheezes. She has no rhonchi. She has no rales.  GI: Soft. Bowel sounds are normal. There is no tenderness.  Musculoskeletal:       Right ankle: She exhibits no swelling.   Left ankle: She exhibits no swelling.  Lymphadenopathy:    She has no cervical adenopathy.  Neurological: She is alert and oriented to person, place, and time. No cranial nerve deficit.  Skin: Skin is warm. Nails show no clubbing.  Chronic lower extremity skin discoloration  Psychiatric: She has a normal mood and affect.      Data Reviewed: Basic Metabolic Panel:  Recent Labs Lab 09/06/15 2318 09/07/15 0536 09/09/15 0423 09/11/15 0559 09/12/15 0553  NA 130* 133* 133* 136 134*  K 4.3 3.8 3.3* 3.5 3.6  CL 93* 95* 94* 95* 92*  CO2 28 31 32 35* 37*  GLUCOSE 184* 172* 124* 134* 84  BUN 38* 34* 28* 37* 38*  CREATININE 1.04* 1.09* 0.97 0.89 0.93  CALCIUM 8.2* 7.9* 7.8* 8.6* 8.4*   Liver Function Tests:  Recent Labs Lab 09/06/15 2318  AST 21  ALT 12*  ALKPHOS 71  BILITOT 0.7  PROT 7.0  ALBUMIN 3.4*   CBC:  Recent Labs Lab 09/06/15 2318 09/07/15 0536 09/09/15 0423 09/11/15 0559 09/12/15 0553  WBC 14.7* 11.4* 8.1 9.2 9.7  NEUTROABS 12.8*  --   --   --   --   HGB 10.1* 9.4* 8.9* 9.6* 9.8*  HCT 31.1* 28.9* 26.4* 29.0* 29.4*  MCV 88.9 90.5 89.0 89.1 86.8  PLT 194 168 146* 189 212   CBG:  Recent Labs Lab 09/11/15 1123 09/11/15 1653 09/11/15 2051 09/12/15 0732 09/12/15 1112  GLUCAP 143* 276* 281* 70 127*    Recent Results (from the past 240  hour(s))  Blood Culture (routine x 2)     Status: None   Collection Time: 09/06/15 11:18 PM  Result Value Ref Range Status   Specimen Description BLOOD LEFT ANTECUBITAL  Final   Special Requests BOTTLES DRAWN AEROBIC AND ANAEROBIC 5ML  Final   Culture NO GROWTH 5 DAYS  Final   Report Status 09/11/2015 FINAL  Final  Blood Culture (routine x 2)     Status: None   Collection Time: 09/06/15 11:18 PM  Result Value Ref Range Status   Specimen Description BLOOD BLOOD LEFT FOREARM  Final   Special Requests BOTTLES DRAWN AEROBIC AND ANAEROBIC 5ML  Final   Culture NO GROWTH 5 DAYS  Final   Report Status 09/11/2015 FINAL   Final  Rapid Influenza A&B Antigens ([place] only)     Status: None   Collection Time: 09/06/15 11:18 PM  Result Value Ref Range Status   Influenza A ([place]) NEGATIVE NEGATIVE Final   Influenza B ([facility]) NEGATIVE NEGATIVE Final  Urine culture     Status: None   Collection Time: 09/06/15 11:31 PM  Result Value Ref Range Status   Specimen Description URINE, RANDOM  Final   Special Requests NONE  Final   Culture NO GROWTH 2 DAYS  Final   Report Status 09/09/2015 FINAL  Final  MRSA PCR Screening     Status: None   Collection Time: 09/07/15  4:50 AM  Result Value Ref Range Status   MRSA by PCR NEGATIVE NEGATIVE Final    Comment:        The GeneXpert MRSA Assay (FDA approved for NASAL specimens only), is one component of a comprehensive MRSA colonization surveillance program. It is not intended to diagnose MRSA infection nor to guide or monitor treatment for MRSA infections.   Culture, blood (routine x 2) Call MD if unable to obtain prior to antibiotics being given     Status: None   Collection Time: 09/07/15  8:07 AM  Result Value Ref Range Status   Specimen Description BLOOD LEFT HAND  Final   Special Requests BOTTLES DRAWN AEROBIC AND ANAEROBIC 1ML  Final   Culture NO GROWTH 5 DAYS  Final   Report Status 09/12/2015 FINAL  Final  Culture, blood (routine x 2) Call MD if unable to obtain prior to antibiotics being given     Status: None   Collection Time: 09/07/15  8:13 AM  Result Value Ref Range Status   Specimen Description BLOOD RIGHT [facility]  Final   Special Requests   Final    BOTTLES DRAWN AEROBIC AND ANAEROBIC AER 5ML ANA 3ML   Culture NO GROWTH 5 DAYS  Final   Report Status 09/12/2015 FINAL  Final    Scheduled Meds: . apixaban  5 mg Oral BID  . budesonide (PULMICORT) nebulizer solution  0.25 mg Nebulization BID  . carvedilol  6.25 mg Oral BID WC  . docusate sodium  100 mg Oral BID  . doxycycline  100 mg Oral Q12H  . escitalopram  10 mg Oral Daily  . fentaNYL  50 mcg  Transdermal Q48H  . ferrous sulfate  325 mg Oral Q breakfast  . furosemide  40 mg Oral QODAY  . furosemide  80 mg Oral QODAY  . insulin aspart  0-5 Units Subcutaneous QHS  . insulin aspart  0-9 Units Subcutaneous TID WC  . insulin glargine  15 Units Subcutaneous Daily  . ipratropium-albuterol  3 mL Nebulization Q6H  . losartan  25 mg Oral Daily  .  magic mouthwash  5 mL Oral TID  . magnesium oxide  400 mg Oral Daily  . methylPREDNISolone (SOLU-MEDROL) injection  40 mg Intravenous Daily  . pantoprazole  40 mg Oral Daily  . rOPINIRole  0.5 mg Oral BID  . spironolactone  25 mg Oral Daily  . vitamin C  500 mg Oral Daily  . Vitamin D (Ergocalciferol)  50,000 Units Oral Q7 days    Assessment/Plan:  1. Acute respiratory failure with hypoxia. Patient currently on 2 L of oxygen. Still requiring 2 liters O2 - sats 91%, will continue weaning efforts 2. Multifocal pneumonia bilaterally. stop Zosyn and continue doxycycline. Also on Solu-Medrol and budesonide nebulizers due to wheezing. 3. Hyponatremia: resolved 4. History of congestive heart failure. No signs continue usual medications of Lasix, losartan, spironolactone and Coreg. 5. Weakness. Physical therapy recommended rehabilitation 6. Patient anticoagulated with Eliquis  Code Status: Full Code Family Communication:  Disposition Plan: STR/SNF. Likely tomorrow depending on clinical condition - [place]  Antibiotics:  doxycycline  Time spent: 25 minutes  [facility], [place] Hospitalists

## 2015-09-12 NOTE — Progress Notes (Signed)
Pt alert and oriented x4, no complaints of pain or discomfort.  Bed in low position, call bell within reach.  Bed alarms on and functioning.  Assessment done and charted.  Will continue to monitor and do hourly rounding throughout the shift 

## 2015-09-12 NOTE — Consult Note (Signed)
Surgical Consultation  09/12/2015  [name] is an 82 y.o. female.   CC: Abdominal pain  HPI: This a patient with abdominal hernia previously unrecognized is been the hospital being treated for both CHF and bilateral pneumonias. She is set for discharge to [facility] for rehabilitation tomorrow and there is a question about a hernia. Patient describes pain in the right side of her abdomen she's never had before started with the coughing and she notices a mass. She she's had no nausea vomiting no fevers or chills has had normal bowel movements. She does describe pain to the right of the umbilicus and has had both an appendectomy as well as a hysterectomy.  Past Medical History  Diagnosis Date  . CHF (congestive heart failure) ([place])   . Diabetes mellitus without complication ([place])   . Hypertension   . Chronic back pain   . Cancer ([place])     skin ca    Past Surgical History  Procedure Laterality Date  . Pacemaker placement    . Leg surgery    . Abdominal hysterectomy    . Tonsillectomy    . Back surgery      x3  . Coronary stent placement      unknown location per pt  . Foot surgery      Family History  Problem Relation Age of Onset  . CVA Mother   . Diabetes Mellitus II Sister   . Heart disease Father     Social History:  reports that she has never smoked. She has never used smokeless tobacco. She reports that she does not drink alcohol or use illicit drugs.  Allergies:  Allergies  Allergen Reactions  . Ceftriaxone Swelling  . Cefuroxime Hives  . Cephalosporins Swelling and Other (See Comments)    Reaction:  Fainting/dry mouth   . Codeine Nausea And Vomiting  . Epinephrine Other (See Comments)    Reaction:  Fainting   . Morphine And Related Other (See Comments)    Reaction:  GI upset   . Buprenorphine Hcl Nausea And Vomiting  . Ciprofloxacin Itching, Swelling and Rash  . Latex Rash  . Lidocaine Rash  . Procaine Rash    Medications reviewed.   Review  of Systems:   Review of Systems  Unable to perform ROS: age     Physical Exam:  BP 132/64 mmHg  Pulse 76  Temp(Src) 98.1 F (36.7 C) (Oral)  Resp 18  Ht '5\' 7"'$  (1.702 m)  Wt 173 lb 4.5 oz (78.6 kg)  BMI 27.13 kg/m2  SpO2 95%  Physical Exam  Constitutional: She is oriented to person, place, and time and well-developed, well-nourished, and in no distress. No distress.  No acute distress comfortable-appearing hard of hearing  HENT:  Head: Normocephalic and atraumatic.  Eyes: Pupils are equal, round, and reactive to light. Right eye exhibits no discharge. Left eye exhibits no discharge. No scleral icterus.  Neck: Normal range of motion.  Cardiovascular: Normal rate, regular rhythm and normal heart sounds.   Pulmonary/Chest: Effort normal. No respiratory distress. She has no wheezes. She has rales.  Abdominal: Soft. She exhibits no distension. There is no tenderness. There is no rebound and no guarding.  Reducible ventral hernia to the right of the umbilicus with no erythema and minimal if any tenderness which resolves once reduced. No Sign of a groin hernia.  Musculoskeletal: Normal range of motion. She exhibits edema. She exhibits no tenderness.  Lymphadenopathy:    She has no cervical adenopathy.  Neurological: She is alert and oriented to person, place, and time.  Skin: Skin is warm and dry. No rash noted. She is not diaphoretic. No erythema.  Psychiatric: Mood and affect normal.  Vitals reviewed.     Results for orders placed or performed during the hospital encounter of 09/06/15 (from the past 48 hour(s))  Glucose, capillary     Status: Abnormal   Collection Time: 09/10/15  8:32 PM  Result Value Ref Range   Glucose-Capillary 302 (H) 65 - 99 mg/dL  Basic metabolic panel     Status: Abnormal   Collection Time: 09/11/15  5:59 AM  Result Value Ref Range   Sodium 136 135 - 145 mmol/L   Potassium 3.5 3.5 - 5.1 mmol/L   Chloride 95 (L) 101 - 111 mmol/L   CO2 35 (H) 22 -  32 mmol/L   Glucose, Bld 134 (H) 65 - 99 mg/dL   BUN 37 (H) 6 - 20 mg/dL   Creatinine, Ser 0.89 0.44 - 1.00 mg/dL   Calcium 8.6 (L) 8.9 - 10.3 mg/dL   GFR calc non Af Amer 56 (L) >60 mL/min   GFR calc Af Amer >60 >60 mL/min    Comment: (NOTE) The eGFR has been calculated using the CKD EPI equation. This calculation has not been validated in all clinical situations. eGFR's persistently <60 mL/min signify possible Chronic Kidney Disease.    Anion gap 6 5 - 15  CBC     Status: Abnormal   Collection Time: 09/11/15  5:59 AM  Result Value Ref Range   WBC 9.2 3.6 - 11.0 K/uL   RBC 3.25 (L) 3.80 - 5.20 MIL/uL   Hemoglobin 9.6 (L) 12.0 - 16.0 g/dL   HCT 29.0 (L) 35.0 - 47.0 %   MCV 89.1 80.0 - 100.0 fL   MCH 29.5 26.0 - 34.0 pg   MCHC 33.1 32.0 - 36.0 g/dL   RDW 14.9 (H) 11.5 - 14.5 %   Platelets 189 150 - 440 K/uL  Glucose, capillary     Status: Abnormal   Collection Time: 09/11/15  7:42 AM  Result Value Ref Range   Glucose-Capillary 102 (H) 65 - 99 mg/dL   Comment 1 Notify RN   Glucose, capillary     Status: Abnormal   Collection Time: 09/11/15 11:23 AM  Result Value Ref Range   Glucose-Capillary 143 (H) 65 - 99 mg/dL   Comment 1 Notify RN   Glucose, capillary     Status: Abnormal   Collection Time: 09/11/15  4:53 PM  Result Value Ref Range   Glucose-Capillary 276 (H) 65 - 99 mg/dL   Comment 1 Notify RN   Glucose, capillary     Status: Abnormal   Collection Time: 09/11/15  8:51 PM  Result Value Ref Range   Glucose-Capillary 281 (H) 65 - 99 mg/dL   Comment 1 Notify RN   CBC     Status: Abnormal   Collection Time: 09/12/15  5:53 AM  Result Value Ref Range   WBC 9.7 3.6 - 11.0 K/uL   RBC 3.39 (L) 3.80 - 5.20 MIL/uL   Hemoglobin 9.8 (L) 12.0 - 16.0 g/dL   HCT 29.4 (L) 35.0 - 47.0 %   MCV 86.8 80.0 - 100.0 fL   MCH 28.9 26.0 - 34.0 pg   MCHC 33.2 32.0 - 36.0 g/dL   RDW 15.1 (H) 11.5 - 14.5 %   Platelets 212 150 - 440 K/uL  Basic metabolic panel     Status:  Abnormal    Collection Time: 09/12/15  5:53 AM  Result Value Ref Range   Sodium 134 (L) 135 - 145 mmol/L   Potassium 3.6 3.5 - 5.1 mmol/L   Chloride 92 (L) 101 - 111 mmol/L   CO2 37 (H) 22 - 32 mmol/L   Glucose, Bld 84 65 - 99 mg/dL   BUN 38 (H) 6 - 20 mg/dL   Creatinine, Ser 0.93 0.44 - 1.00 mg/dL   Calcium 8.4 (L) 8.9 - 10.3 mg/dL   GFR calc non Af Amer 53 (L) >60 mL/min   GFR calc Af Amer >60 >60 mL/min    Comment: (NOTE) The eGFR has been calculated using the CKD EPI equation. This calculation has not been validated in all clinical situations. eGFR's persistently <60 mL/min signify possible Chronic Kidney Disease.    Anion gap 5 5 - 15  Glucose, capillary     Status: None   Collection Time: 09/12/15  7:32 AM  Result Value Ref Range   Glucose-Capillary 70 65 - 99 mg/dL   Comment 1 Notify RN   Glucose, capillary     Status: Abnormal   Collection Time: 09/12/15 11:12 AM  Result Value Ref Range   Glucose-Capillary 127 (H) 65 - 99 mg/dL   Comment 1 Notify RN   Glucose, capillary     Status: Abnormal   Collection Time: 09/12/15  5:09 PM  Result Value Ref Range   Glucose-Capillary 353 (H) 65 - 99 mg/dL   Comment 1 Notify RN   Glucose, capillary     Status: Abnormal   Collection Time: 09/12/15  5:25 PM  Result Value Ref Range   Glucose-Capillary 383 (H) 65 - 99 mg/dL   Comment 1 Notify RN    Dg Chest 2 View  09/12/2015  CLINICAL DATA:  Worsening shortness of breath. Congestive heart failure. Atrial fibrillation. Acute respiratory failure. EXAM: CHEST  2 VIEW COMPARISON:  09/06/2015 FINDINGS: Mild cardiomegaly remains stable. Dual lead transvenous pacemaker remains in appropriate position. Diffuse pulmonary interstitial prominence appears stable and suspicious for mild interstitial edema. No evidence of focal pulmonary consolidation, pneumothorax, or pleural effusion. Thoracolumbar spinal fixation rods again noted. IMPRESSION: No significant change in cardiomegaly and probable mild  interstitial edema. Electronically Signed   By: Earle Gell M.D.   On: 09/12/2015 08:15    Assessment/Plan:  No abdominal films for review. On exam the patient has a reducible nontender ventral hernia likely related to prior hysterectomy or possibly even an appendectomy. I see no contraindication to the patient being discharged and she does not require surgery at this time certainly not emergent surgery. With her pneumonia and CHF she would not be an elective surgical candidate at this time but may require repair at a later date either electively or urgently should it incarcerate signs of incarceration were discussed with she and her husband. This is patient with Dr. Manuella Ghazi personally prior to seeing the patient.  Florene Glen, MD, FACS

## 2015-09-12 NOTE — Progress Notes (Signed)
Son called to see what was going on. Told the son regarding the surgeon came and stated that it was indeed a hernia but no surgical procedure will be done because of the pt respiratory issues.  He verbalized understanding.  Told the husband of the decision as well.  The husband still do not want the pt to be discharged in the am. Will call dr Manuella Ghazi and make him aware.  No other discomfort noted at this time

## 2015-09-12 NOTE — Telephone Encounter (Signed)
Called [name]; last attended 08/23/2015. A family member states she was in the hospital for pneumonia.

## 2015-09-12 NOTE — Progress Notes (Signed)
Speech Language Pathology Treatment: Dysphagia  Patient Details [name] MRN: [id number] DOB: 01/14/1925 Today's Date: 09/12/2015 Time: 0745-0820 SLP Time Calculation (min) (ACUTE ONLY): 35 min  Assessment / Plan / Recommendation Clinical Impression  Pt appears to be tolerating her current po diet(regular); exhibited no s/s of aspiration or difficulty w/ mastication of cut, soft pieces of food representing a Dys. 3 diet consistency during tx session but acknowledged that tougher meats can be difficult to chew "sometimes". Pt given only min. tray setup assistance but fed self fully. Pt continues to have a min. congested cough at baseline but not during po trials; rec. continue w/ current diet small pieces of meat cut and moistened) w/ thin liquids w/ aspiration precautions; continue meds w/ liquids or in puree - if nec. For easier swallowing. No further skilled ST services indicated at this time sec. to pt appearing to be at her baseline w/ toleration of oral diet; pt agreed. NSG updated and agreed   HPI HPI: Pt is a 82 y.o. female with a known history of congestive heart failure, diabetes mellitus type 2, hypertension, chronic back pain presented to the emergency room with confusion and lethargy. Patient was recently admitted to our hospital last Friday and treated for respiratory distress heart failure and urinary tract infection. She was discharged to home on Sunday. But according to family members she has been more lethargic and confused since one day. Patient unable to give any history only arousable to painful stimuli and loud verbal commands. Workup in the emergency room showed a bilateral pneumonia and low sodium level. The patient appeared dry and dehydrated. According to a family member patient also had some cough and a fall hitting her head ~3 weeks ago. Pt stated she usually eats a regular diet at home and denies any difficulty swallowing; husband agreed. Pt attends  Cardiopulmonary Rehab workout classes at [facility]. Noted a baseline, congested cough when moving about/increased breathing. Of note, this was not noted in her H&P, however, pt states she takes a PPI(40mg) for "acid reflux" and that she has been taking a medication(pill) for Thrush in the past ~2 weeks. Pt stated she has felt she has been awakening in the mornings w/ a phlegm coating her throat and mouth - could have been at risk for this "phlegm" or even Thrush to have compromised her airway/entrance. No known Neurological events other than the fall w/ hit to back of head ~3 weeks ago. Pt has been tolerating her current diet choosing foods easy to eat. Pt stated her throat and mouth were "less sore now" after receiving mouth rinse tx for Thrush. She denies any s/s of aspiration when eating meals; NSG stated pt tolerates meds w/ liquids adequately.       SLP Plan  All goals met     Recommendations  Diet recommendations: Regular;Thin liquid (meats cut well/small and moistened) Liquids provided via: Cup;Straw Medication Administration: Whole meds with liquid Supervision: Patient able to self feed Compensations: Minimize environmental distractions;Slow rate;Small sips/bites;Follow solids with liquid Postural Changes and/or Swallow Maneuvers: Seated upright 90 degrees             General recommendations:  (Dietician consult/follow up for supplements as indicated) Oral Care Recommendations: Oral care BID;Staff/trained caregiver to provide oral care Follow up Recommendations: None Plan: All goals met     GO                , MS, CCC-SLP  , 09/12/2015, 11:26 AM    

## 2015-09-12 NOTE — Progress Notes (Signed)
D/w son

## 2015-09-12 NOTE — Progress Notes (Signed)
Pt complaining of right side pain,  Pt states she feels a lump in the abdomen and it is really hurting.  i felt the lump which is probably a hernia.  Called dr Manuella Ghazi because the husband wanted him to come and look at it.  Dr Manuella Ghazi will be sending a surgeon over to see it and the pt and husband was made aware of the decision for the surgeon.

## 2015-09-13 LAB — GLUCOSE, CAPILLARY
Glucose-Capillary: 134 mg/dL — ABNORMAL HIGH (ref 65–99)
Glucose-Capillary: 245 mg/dL — ABNORMAL HIGH (ref 65–99)
Glucose-Capillary: 361 mg/dL — ABNORMAL HIGH (ref 65–99)

## 2015-09-13 MED ORDER — PREDNISONE 10 MG (21) PO TBPK: 10.0000 mg | ORAL_TABLET | Freq: Every day | ORAL | Status: DC

## 2015-09-13 MED ORDER — DOXYCYCLINE HYCLATE 100 MG PO TABS: 100.0000 mg | ORAL_TABLET | Freq: Two times a day (BID) | ORAL | Status: DC

## 2015-09-13 NOTE — Progress Notes (Signed)
Clinical Social Worker was informed that patient will be medically ready to discharge to [place]. Patient and her husband are in a agreement with plan. CSW called Maudie Mercury- Admissions Coordinator at [facility] to confirm that patient's bed is ready. Provided patient's room number 202A and number to call for report [phone] . All discharge information faxed to [facility] via [place].     RN will call report and patient will discharge to [facility] via [facility].   Ernest Pine, MSW, [place] Social Work Department [phone]

## 2015-09-13 NOTE — Care Management Important Message (Signed)
Important Message  Patient Details  [name] MRN: [id number] Date of Birth: 20-Jun-1925   Medicare Important Message Given:  Yes    [name] 09/13/2015, 1:36 PM

## 2015-09-13 NOTE — Progress Notes (Signed)
Patient is now ready for discharge from MD standpoint and social work. Husband has been at bedside all day. AVS printed and given to them. Packet prepared by social work. EMS has been called for transport. Patient is dressed, will remove IV's and tele when EMS arrives. Report called to [facility]. Dr. Manuella Ghazi has spoken to son, Thu Tenaglia and he has been updated about discharge.

## 2015-09-13 NOTE — Discharge Instructions (Signed)

## 2015-09-13 NOTE — Progress Notes (Signed)
Inpatient Diabetes Program Recommendations  AACE/ADA: New Consensus Statement on Inpatient Glycemic Control (2015)  Target Ranges:  Prepandial:   less than 140 mg/dL      Peak postprandial:   less than 180 mg/dL (1-2 hours)      Critically ill patients:  140 - 180 mg/dL   Review of Glycemic Control:  Results for MIKYA, LINARES (MRN [id number]) as of 09/13/2015 13:32  Ref. Range 09/12/2015 07:32 09/12/2015 11:12 09/12/2015 17:09 09/12/2015 17:25 09/12/2015 21:43 09/13/2015 07:51 09/13/2015 11:54  Glucose-Capillary Latest Ref Range: 65-99 mg/dL 70 127 (H) 353 (H) 383 (H) 305 (H) 134 (H) 245 (H)   Diabetes history: Type 2 diabetes Outpatient Diabetes medications: Amaryl 4 mg bid, Januvia 100 mg daily Current orders for Inpatient glycemic control:  Lantus 15 units daily, Novolog sensitive tid with meals and HS Inpatient Diabetes Program Recommendations:    Consider adding Novolog meal coverage 3 units tid with meals (hold if patient eats less than 50%).  Thanks, Adah Perl, RN, BC-ADM Inpatient Diabetes Coordinator Pager [phone] (8a-5p)

## 2015-09-13 NOTE — Progress Notes (Signed)
EMS is here and transporting patient now. IV's removed.

## 2015-09-13 NOTE — Discharge Summary (Signed)
[facility] [place] [name]    MR#:  [id number]  DATE OF BIRTH:  07/23/1924  DATE OF ADMISSION:  09/06/2015 ADMITTING PHYSICIAN: Saundra Shelling, MD  DATE OF DISCHARGE: 09/13/2015  PRIMARY CARE PHYSICIAN: Tracie Harrier, MD    ADMISSION DIAGNOSIS:  CAP (community acquired pneumonia) [J18.9]  DISCHARGE DIAGNOSIS:  Active Problems:   Pneumonia   Hyponatremia   Ventral hernia without obstruction or gangrene  SECONDARY DIAGNOSIS:   Past Medical History  Diagnosis Date  . CHF (congestive heart failure) ([place])   . Diabetes mellitus without complication ([place])   . Hypertension   . Chronic back pain   . Cancer [facility])     skin ca   HOSPITAL COURSE:  82 y.o. female with a known history of congestive heart failure, diabetes mellitus type 2, hypertension, chronic back pain admitted with confusion and lethargy. Workup in the emergency room showed a bilateral pneumonia and low sodium level.  See dictated history and physical for further details.  1. Acute respiratory failure with hypoxia. Patient currently on 2 L of oxygen , which is her chronic oxygen requirement 2. Multifocal pneumonia bilaterally: Improving on antibiotics and steroids 3. Hyponatremia: Sodium of 134, improved with IV hydration.  This was likely due to dehydration 4. History of congestive heart failure: Well compensated at this time 5. Weakness. Physical therapy recommended rehabilitation where she is being discharged  6. Chronic atrial fibrillation: Rate is well controlled on current medication, she is being anticoagulated with Eliquis  Patient remains at very high risk for readmission considering her multiple medical problems.  Advanced age and chronic pain issues.  This was discussed with patient's son, Darnell Level at [phone] who agrees and will likely have a discussion with patient and her husband regarding CODE STATUS.  He does seem to lean towards changing it  to DO NOT RESUSCITATE but will have a discussion was she has discharge.  I also recommended him to consider palliative care while at the facility and hospice if appropriate.  Patient was requested not to go to a wedding, which is coming up in a week to [place], [place].  Considering her current health condition.  She is in agreement and so are her family members. DISCHARGE CONDITIONS:  Stable CONSULTS OBTAINED:  Treatment Team:  Florene Glen, MD  DRUG ALLERGIES:   Allergies  Allergen Reactions  . Ceftriaxone Swelling  . Cefuroxime Hives  . Cephalosporins Swelling and Other (See Comments)    Reaction:  Fainting/dry mouth   . Codeine Nausea And Vomiting  . Epinephrine Other (See Comments)    Reaction:  Fainting   . Morphine And Related Other (See Comments)    Reaction:  GI upset   . Buprenorphine Hcl Nausea And Vomiting  . Ciprofloxacin Itching, Swelling and Rash  . Latex Rash  . Lidocaine Rash  . Procaine Rash    DISCHARGE MEDICATIONS:   Current Discharge Medication List    START taking these medications   Details  doxycycline (VIBRA-TABS) 100 MG tablet Take 1 tablet (100 mg total) by mouth every 12 (twelve) hours. Qty: 8 tablet, Refills: 0    predniSONE (STERAPRED UNI-PAK 21 TAB) 10 MG (21) TBPK tablet Take 1 tablet (10 mg total) by mouth daily. Start 60 mg po daily, taper 10 mg daily until done Qty: 21 tablet, Refills: 0      CONTINUE these medications which have NOT CHANGED   Details  apixaban (ELIQUIS) 5 MG TABS  tablet Take 5 mg by mouth 2 (two) times daily.    budesonide-formoterol (SYMBICORT) 160-4.5 MCG/ACT inhaler Inhale 2 puffs into the lungs 2 (two) times daily. Qty: 1 Inhaler, Refills: 12    carvedilol (COREG) 6.25 MG tablet Take 6.25 mg by mouth 2 (two) times daily with a meal.     cholecalciferol (VITAMIN D) 1000 units tablet Take 1,000 Units by mouth daily.    docusate sodium (COLACE) 100 MG capsule Take 100 mg by mouth 2 (two) times  daily.     escitalopram (LEXAPRO) 10 MG tablet Take 10 mg by mouth daily.    fentaNYL (DURAGESIC - DOSED MCG/HR) 50 MCG/HR Place 50 mcg onto the skin every other day.     ferrous sulfate 325 (65 FE) MG tablet Take 325 mg by mouth daily with breakfast.    !! furosemide (LASIX) 40 MG tablet Take 40 mg by mouth every other day. Pt alternates with the 80mg  tablet.    !! furosemide (LASIX) 80 MG tablet Take 80 mg by mouth every other day. Pt alternates with the 40mg  tablet.    glimepiride (AMARYL) 4 MG tablet Take 4 mg by mouth 2 (two) times daily after a meal.    losartan (COZAAR) 25 MG tablet Take 25 mg by mouth daily.    magnesium oxide (MAG-OX) 400 MG tablet Take 400 mg by mouth daily.    Melatonin 1 MG TABS Take 1 mg by mouth at bedtime.    metolazone (ZAROXOLYN) 2.5 MG tablet Take 2.5 mg by mouth 2 (two) times a week. Pt takes on Monday and Friday.    oxyCODONE (OXY IR/ROXICODONE) 5 MG immediate release tablet Take 5-10 mg by mouth every 4 (four) hours as needed for severe pain.     pantoprazole (PROTONIX) 40 MG tablet Take 40 mg by mouth daily.    rOPINIRole (REQUIP) 0.5 MG tablet Take 0.5 mg by mouth 2 (two) times daily.    sitaGLIPtin (JANUVIA) 100 MG tablet Take 100 mg by mouth daily.    spironolactone (ALDACTONE) 25 MG tablet Take 25 mg by mouth daily.     vitamin B-12 (CYANOCOBALAMIN) 1000 MCG tablet Take 1,000 mcg by mouth daily.    vitamin C (ASCORBIC ACID) 500 MG tablet Take 500 mg by mouth daily.    Vitamin D, Ergocalciferol, (DRISDOL) 50000 UNITS CAPS capsule Take 50,000 Units by mouth every 7 (seven) days. Pt takes on Thursday.     !! - Potential duplicate medications found. Please discuss with provider.    STOP taking these medications     levofloxacin (LEVAQUIN) 250 MG tablet          DISCHARGE INSTRUCTIONS:    DIET:  Cardiac diet  DISCHARGE CONDITION:  Stable  ACTIVITY:  Activity as tolerated  OXYGEN:  Home Oxygen: Yes.     Oxygen  Delivery: 2 liters/min via Patient connected to nasal cannula oxygen  DISCHARGE LOCATION:  nursing home   If you experience worsening of your admission symptoms, develop shortness of breath, life threatening emergency, suicidal or homicidal thoughts you must seek medical attention immediately by calling 911 or calling your MD immediately  if symptoms less severe.  You Must read complete instructions/literature along with all the possible adverse reactions/side effects for all the Medicines you take and that have been prescribed to you. Take any new Medicines after you have completely understood and accpet all the possible adverse reactions/side effects.   Please note  You were cared for by a hospitalist during your  hospital stay. If you have any questions about your discharge medications or the care you received while you were in the hospital after you are discharged, you can call the unit and asked to speak with the hospitalist on call if the hospitalist that took care of you is not available. Once you are discharged, your primary care physician will handle any further medical issues. Please note that NO REFILLS for any discharge medications will be authorized once you are discharged, as it is imperative that you return to your primary care physician (or establish a relationship with a primary care physician if you do not have one) for your aftercare needs so that they can reassess your need for medications and monitor your lab values.    On the day of Discharge:  VITAL SIGNS:  Blood pressure 140/57, pulse 74, temperature 97.8 F (36.6 C), temperature source Oral, resp. rate 18, height 5\' 7"  (1.702 m), weight 78.6 kg (173 lb 4.5 oz), SpO2 98 %. PHYSICAL EXAMINATION:  GENERAL:  82 y.o.-year-old patient lying in the bed with no acute distress.  EYES: Pupils equal, round, reactive to light and accommodation. No scleral icterus. Extraocular muscles intact.  HEENT: Head atraumatic, normocephalic.  Oropharynx and nasopharynx clear.  NECK:  Supple, no jugular venous distention. No thyroid enlargement, no tenderness.  LUNGS: Normal breath sounds bilaterally, no wheezing, rales,rhonchi or crepitation. No use of accessory muscles of respiration.  CARDIOVASCULAR: S1, S2 normal. No murmurs, rubs, or gallops.  ABDOMEN: Soft, non-tender, non-distended. Bowel sounds present. No organomegaly or mass.  EXTREMITIES: No pedal edema, cyanosis, or clubbing.  NEUROLOGIC: Cranial nerves II through XII are intact. Muscle strength 5/5 in all extremities. Sensation intact. Gait not checked.  PSYCHIATRIC: The patient is alert and oriented x 3.  SKIN: No obvious rash, lesion, or ulcer.  DATA REVIEW:   CBC  Recent Labs Lab 09/12/15 0553  WBC 9.7  HGB 9.8*  HCT 29.4*  PLT 212    Chemistries   Recent Labs Lab 09/06/15 2318  09/12/15 0553  NA 130*  < > 134*  K 4.3  < > 3.6  CL 93*  < > 92*  CO2 28  < > 37*  GLUCOSE 184*  < > 84  BUN 38*  < > 38*  CREATININE 1.04*  < > 0.93  CALCIUM 8.2*  < > 8.4*  AST 21  --   --   ALT 12*  --   --   ALKPHOS 71  --   --   BILITOT 0.7  --   --   < > = values in this interval not displayed.  Cardiac Enzymes No results for input(s): TROPONINI in the last 168 hours.  Microbiology Results  Results for orders placed or performed during the hospital encounter of 09/06/15  Blood Culture (routine x 2)     Status: None   Collection Time: 09/06/15 11:18 PM  Result Value Ref Range Status   Specimen Description BLOOD LEFT ANTECUBITAL  Final   Special Requests BOTTLES DRAWN AEROBIC AND ANAEROBIC 5ML  Final   Culture NO GROWTH 5 DAYS  Final   Report Status 09/11/2015 FINAL  Final  Blood Culture (routine x 2)     Status: None   Collection Time: 09/06/15 11:18 PM  Result Value Ref Range Status   Specimen Description BLOOD BLOOD LEFT FOREARM  Final   Special Requests BOTTLES DRAWN AEROBIC AND ANAEROBIC 5ML  Final   Culture NO GROWTH 5 DAYS  Final  Report  Status 09/11/2015 FINAL  Final  Rapid Influenza A&B Antigens ([facility] only)     Status: None   Collection Time: 09/06/15 11:18 PM  Result Value Ref Range Status   Influenza A ([facility]) NEGATIVE NEGATIVE Final   Influenza B ([facility]) NEGATIVE NEGATIVE Final  Urine culture     Status: None   Collection Time: 09/06/15 11:31 PM  Result Value Ref Range Status   Specimen Description URINE, RANDOM  Final   Special Requests NONE  Final   Culture NO GROWTH 2 DAYS  Final   Report Status 09/09/2015 FINAL  Final  MRSA PCR Screening     Status: None   Collection Time: 09/07/15  4:50 AM  Result Value Ref Range Status   MRSA by PCR NEGATIVE NEGATIVE Final    Comment:        The GeneXpert MRSA Assay (FDA approved for NASAL specimens only), is one component of a comprehensive MRSA colonization surveillance program. It is not intended to diagnose MRSA infection nor to guide or monitor treatment for MRSA infections.   Culture, blood (routine x 2) Call MD if unable to obtain prior to antibiotics being given     Status: None   Collection Time: 09/07/15  8:07 AM  Result Value Ref Range Status   Specimen Description BLOOD LEFT HAND  Final   Special Requests BOTTLES DRAWN AEROBIC AND ANAEROBIC 1ML  Final   Culture NO GROWTH 5 DAYS  Final   Report Status 09/12/2015 FINAL  Final  Culture, blood (routine x 2) Call MD if unable to obtain prior to antibiotics being given     Status: None   Collection Time: 09/07/15  8:13 AM  Result Value Ref Range Status   Specimen Description BLOOD RIGHT [facility]  Final   Special Requests   Final    BOTTLES DRAWN AEROBIC AND ANAEROBIC AER 5ML ANA 3ML   Culture NO GROWTH 5 DAYS  Final   Report Status 09/12/2015 FINAL  Final    RADIOLOGY:  Dg Chest 2 View  09/12/2015  CLINICAL DATA:  Worsening shortness of breath. Congestive heart failure. Atrial fibrillation. Acute respiratory failure. EXAM: CHEST  2 VIEW COMPARISON:  09/06/2015 FINDINGS: Mild cardiomegaly remains stable. Dual  lead transvenous pacemaker remains in appropriate position. Diffuse pulmonary interstitial prominence appears stable and suspicious for mild interstitial edema. No evidence of focal pulmonary consolidation, pneumothorax, or pleural effusion. Thoracolumbar spinal fixation rods again noted. IMPRESSION: No significant change in cardiomegaly and probable mild interstitial edema. Electronically Signed   By: Earle Gell M.D.   On: 09/12/2015 08:15     Management plans discussed with the patient, family and they are in agreement.  CODE STATUS:     Code Status Orders        Start     Ordered   09/07/15 0441  Full code   Continuous     09/07/15 0440    Code Status History    Date Active Date Inactive Code Status Order ID Comments User Context   09/01/2015  3:33 PM 09/03/2015  7:07 PM Full Code [id number]  Epifanio Lesches, MD ED   03/21/2015  6:26 AM 03/22/2015  7:39 PM Full Code [id number]  Harrie Foreman, MD Inpatient      She is at very high risk for readmission, highly recommend considering palliative care evaluation while at the facility. This was discussed with patient's son , Darnell Level at [phone].  TOTAL TIME TAKING CARE OF THIS PATIENT: 45 minutes.  [facility], Clifford Benninger M.D on 09/13/2015 at 2:23 PM  Between 7am to 6pm - Pager - [phone]  After 6pm go to [email or web address] - password [email or web address] [facility]  Office  [phone]  CC: Primary care physician; Tracie Harrier, MD   Note: This dictation was prepared with Dragon dictation along with smaller phrase technology. Any transcriptional errors that result from this process are unintentional.

## 2015-09-14 DIAGNOSIS — E119 Type 2 diabetes mellitus without complications: Secondary | ICD-10-CM | POA: Diagnosis present

## 2015-09-14 LAB — GLUCOSE, CAPILLARY
GLUCOSE-CAPILLARY: 208 mg/dL — AB (ref 65–99)
GLUCOSE-CAPILLARY: 327 mg/dL — AB (ref 65–99)

## 2015-09-15 LAB — BLOOD GAS, VENOUS
Acid-Base Excess: 8.6 mmol/L — ABNORMAL HIGH (ref 0.0–3.0)
Bicarbonate: 34.4 mEq/L — ABNORMAL HIGH (ref 21.0–28.0)
PCO2 VEN: 53 mmHg (ref 44.0–60.0)
PH VEN: 7.42 (ref 7.320–7.430)
Patient temperature: 37

## 2015-09-19 DIAGNOSIS — E119 Type 2 diabetes mellitus without complications: Secondary | ICD-10-CM | POA: Diagnosis not present

## 2015-09-19 LAB — GLUCOSE, CAPILLARY: GLUCOSE-CAPILLARY: 296 mg/dL — AB (ref 65–99)

## 2015-09-21 DIAGNOSIS — E119 Type 2 diabetes mellitus without complications: Secondary | ICD-10-CM | POA: Diagnosis not present

## 2015-09-21 LAB — GLUCOSE, CAPILLARY
GLUCOSE-CAPILLARY: 187 mg/dL — AB (ref 65–99)
GLUCOSE-CAPILLARY: 240 mg/dL — AB (ref 65–99)
GLUCOSE-CAPILLARY: 270 mg/dL — AB (ref 65–99)
Glucose-Capillary: 184 mg/dL — ABNORMAL HIGH (ref 65–99)
Glucose-Capillary: 291 mg/dL — ABNORMAL HIGH (ref 65–99)

## 2015-09-22 DIAGNOSIS — E119 Type 2 diabetes mellitus without complications: Secondary | ICD-10-CM | POA: Diagnosis not present

## 2015-09-22 LAB — CBC WITH DIFFERENTIAL/PLATELET
Basophils Absolute: 0.1 10*3/uL (ref 0–0.1)
Basophils Relative: 0 %
Eosinophils Absolute: 0.1 10*3/uL (ref 0–0.7)
Eosinophils Relative: 1 %
HEMATOCRIT: 35.5 % (ref 35.0–47.0)
HEMOGLOBIN: 11.5 g/dL — AB (ref 12.0–16.0)
LYMPHS ABS: 0.9 10*3/uL — AB (ref 1.0–3.6)
LYMPHS PCT: 6 %
MCH: 29.3 pg (ref 26.0–34.0)
MCHC: 32.5 g/dL (ref 32.0–36.0)
MCV: 90.1 fL (ref 80.0–100.0)
MONO ABS: 1.5 10*3/uL — AB (ref 0.2–0.9)
MONOS PCT: 10 %
NEUTROS ABS: 12.9 10*3/uL — AB (ref 1.4–6.5)
NEUTROS PCT: 83 %
Platelets: 227 10*3/uL (ref 150–440)
RBC: 3.94 MIL/uL (ref 3.80–5.20)
RDW: 16.8 % — AB (ref 11.5–14.5)
WBC: 15.4 10*3/uL — ABNORMAL HIGH (ref 3.6–11.0)

## 2015-09-22 LAB — COMPREHENSIVE METABOLIC PANEL
ALBUMIN: 3.2 g/dL — AB (ref 3.5–5.0)
ALK PHOS: 59 U/L (ref 38–126)
ALT: 14 U/L (ref 14–54)
ANION GAP: 9 (ref 5–15)
AST: 14 U/L — ABNORMAL LOW (ref 15–41)
BILIRUBIN TOTAL: 0.8 mg/dL (ref 0.3–1.2)
BUN: 50 mg/dL — ABNORMAL HIGH (ref 6–20)
CALCIUM: 8.5 mg/dL — AB (ref 8.9–10.3)
CO2: 34 mmol/L — ABNORMAL HIGH (ref 22–32)
Chloride: 89 mmol/L — ABNORMAL LOW (ref 101–111)
Creatinine, Ser: 1.1 mg/dL — ABNORMAL HIGH (ref 0.44–1.00)
GFR calc Af Amer: 50 mL/min — ABNORMAL LOW (ref >60)
GFR, EST NON AFRICAN AMERICAN: 44 mL/min — AB (ref >60)
GLUCOSE: 208 mg/dL — AB (ref 65–99)
Potassium: 2.8 mmol/L — CL (ref 3.5–5.1)
Sodium: 132 mmol/L — ABNORMAL LOW (ref 135–145)
TOTAL PROTEIN: 6.5 g/dL (ref 6.5–8.1)

## 2015-09-22 LAB — GLUCOSE, CAPILLARY
Glucose-Capillary: 213 mg/dL — ABNORMAL HIGH (ref 65–99)
Glucose-Capillary: 295 mg/dL — ABNORMAL HIGH (ref 65–99)
Glucose-Capillary: 282 mg/dL — ABNORMAL HIGH (ref 65–99)
Glucose-Capillary: 231 mg/dL — ABNORMAL HIGH (ref 65–99)

## 2015-09-23 ENCOUNTER — Encounter
Admission: RE | Admit: 2015-09-23 | Discharge: 2015-09-23 | Disposition: A | Discharge status: Home / Self Care | Payer: Medicare Other | Source: Ambulatory Visit | Attending: Internal Medicine | Admitting: Internal Medicine

## 2015-09-23 DIAGNOSIS — E119 Type 2 diabetes mellitus without complications: Secondary | ICD-10-CM | POA: Diagnosis not present

## 2015-09-23 DIAGNOSIS — E871 Hypo-osmolality and hyponatremia: Secondary | ICD-10-CM | POA: Insufficient documentation

## 2015-09-23 DIAGNOSIS — Z794 Long term (current) use of insulin: Secondary | ICD-10-CM | POA: Insufficient documentation

## 2015-09-23 LAB — GLUCOSE, CAPILLARY
GLUCOSE-CAPILLARY: 228 mg/dL — AB (ref 65–99)
GLUCOSE-CAPILLARY: 247 mg/dL — AB (ref 65–99)
GLUCOSE-CAPILLARY: 261 mg/dL — AB (ref 65–99)
Glucose-Capillary: 254 mg/dL — ABNORMAL HIGH (ref 65–99)

## 2015-09-24 DIAGNOSIS — E119 Type 2 diabetes mellitus without complications: Secondary | ICD-10-CM | POA: Diagnosis not present

## 2015-09-24 LAB — GLUCOSE, CAPILLARY
GLUCOSE-CAPILLARY: 228 mg/dL — AB (ref 65–99)
GLUCOSE-CAPILLARY: 192 mg/dL — AB (ref 65–99)
Glucose-Capillary: 182 mg/dL — ABNORMAL HIGH (ref 65–99)
Glucose-Capillary: 173 mg/dL — ABNORMAL HIGH (ref 65–99)

## 2015-09-25 DIAGNOSIS — E119 Type 2 diabetes mellitus without complications: Secondary | ICD-10-CM | POA: Diagnosis not present

## 2015-09-26 ENCOUNTER — Telehealth: Payer: Self-pay | Admitting: Respiratory Therapy

## 2015-09-26 DIAGNOSIS — E119 Type 2 diabetes mellitus without complications: Secondary | ICD-10-CM | POA: Diagnosis not present

## 2015-09-26 LAB — CBC WITH DIFFERENTIAL/PLATELET
BASOS ABS: 0.1 10*3/uL (ref 0–0.1)
Basophils Relative: 1 %
EOS ABS: 0.3 10*3/uL (ref 0–0.7)
EOS PCT: 3 %
HCT: 31.8 % — ABNORMAL LOW (ref 35.0–47.0)
Hemoglobin: 10.6 g/dL — ABNORMAL LOW (ref 12.0–16.0)
Lymphocytes Relative: 7 %
Lymphs Abs: 0.7 10*3/uL — ABNORMAL LOW (ref 1.0–3.6)
MCH: 29 pg (ref 26.0–34.0)
MCHC: 33.2 g/dL (ref 32.0–36.0)
MCV: 87.3 fL (ref 80.0–100.0)
Monocytes Absolute: 1.1 10*3/uL — ABNORMAL HIGH (ref 0.2–0.9)
Monocytes Relative: 10 %
Neutro Abs: 8.6 10*3/uL — ABNORMAL HIGH (ref 1.4–6.5)
Neutrophils Relative %: 79 %
PLATELETS: 195 10*3/uL (ref 150–440)
RBC: 3.64 MIL/uL — AB (ref 3.80–5.20)
RDW: 17 % — AB (ref 11.5–14.5)
WBC: 10.8 10*3/uL (ref 3.6–11.0)

## 2015-09-26 LAB — COMPREHENSIVE METABOLIC PANEL
ALT: 11 U/L — AB (ref 14–54)
AST: 13 U/L — AB (ref 15–41)
Albumin: 3 g/dL — ABNORMAL LOW (ref 3.5–5.0)
Alkaline Phosphatase: 63 U/L (ref 38–126)
Anion gap: 8 (ref 5–15)
BUN: 65 mg/dL — ABNORMAL HIGH (ref 6–20)
CHLORIDE: 92 mmol/L — AB (ref 101–111)
CO2: 31 mmol/L (ref 22–32)
CREATININE: 1.35 mg/dL — AB (ref 0.44–1.00)
Calcium: 8.8 mg/dL — ABNORMAL LOW (ref 8.9–10.3)
GFR calc non Af Amer: 34 mL/min — ABNORMAL LOW (ref >60)
GFR, EST AFRICAN AMERICAN: 39 mL/min — AB (ref >60)
Glucose, Bld: 219 mg/dL — ABNORMAL HIGH (ref 65–99)
POTASSIUM: 3.9 mmol/L (ref 3.5–5.1)
SODIUM: 131 mmol/L — AB (ref 135–145)
Total Bilirubin: 0.7 mg/dL (ref 0.3–1.2)
Total Protein: 6.8 g/dL (ref 6.5–8.1)

## 2015-09-26 LAB — GLUCOSE, CAPILLARY
GLUCOSE-CAPILLARY: 150 mg/dL — AB (ref 65–99)
GLUCOSE-CAPILLARY: 218 mg/dL — AB (ref 65–99)
GLUCOSE-CAPILLARY: 145 mg/dL — AB (ref 65–99)
GLUCOSE-CAPILLARY: 187 mg/dL — AB (ref 65–99)
Glucose-Capillary: 221 mg/dL — ABNORMAL HIGH (ref 65–99)
Glucose-Capillary: 380 mg/dL — ABNORMAL HIGH (ref 65–99)
Glucose-Capillary: 180 mg/dL — ABNORMAL HIGH (ref 65–99)
Glucose-Capillary: 194 mg/dL — ABNORMAL HIGH (ref 65–99)
Glucose-Capillary: 177 mg/dL — ABNORMAL HIGH (ref 65–99)

## 2015-09-26 NOTE — Telephone Encounter (Signed)
[name]'s husband called and states that [name] in recovering from her pneumonia at [facility] rehab facility; she is planning to return to [place]. She last attended 09/08/2015.

## 2015-09-27 DIAGNOSIS — E119 Type 2 diabetes mellitus without complications: Secondary | ICD-10-CM | POA: Diagnosis not present

## 2015-09-27 LAB — GLUCOSE, CAPILLARY
GLUCOSE-CAPILLARY: 215 mg/dL — AB (ref 65–99)
Glucose-Capillary: 152 mg/dL — ABNORMAL HIGH (ref 65–99)
Glucose-Capillary: 214 mg/dL — ABNORMAL HIGH (ref 65–99)
Glucose-Capillary: 151 mg/dL — ABNORMAL HIGH (ref 65–99)

## 2015-09-28 DIAGNOSIS — E119 Type 2 diabetes mellitus without complications: Secondary | ICD-10-CM | POA: Diagnosis not present

## 2015-09-28 LAB — GLUCOSE, CAPILLARY
GLUCOSE-CAPILLARY: 144 mg/dL — AB (ref 65–99)
GLUCOSE-CAPILLARY: 223 mg/dL — AB (ref 65–99)
Glucose-Capillary: 212 mg/dL — ABNORMAL HIGH (ref 65–99)
Glucose-Capillary: 170 mg/dL — ABNORMAL HIGH (ref 65–99)

## 2015-09-28 LAB — COMPREHENSIVE METABOLIC PANEL
ALBUMIN: 3.4 g/dL — AB (ref 3.5–5.0)
ALT: 12 U/L — AB (ref 14–54)
ANION GAP: 8 (ref 5–15)
AST: 14 U/L — AB (ref 15–41)
Alkaline Phosphatase: 60 U/L (ref 38–126)
BUN: 50 mg/dL — AB (ref 6–20)
CO2: 32 mmol/L (ref 22–32)
Calcium: 9.1 mg/dL (ref 8.9–10.3)
Chloride: 95 mmol/L — ABNORMAL LOW (ref 101–111)
Creatinine, Ser: 1.16 mg/dL — ABNORMAL HIGH (ref 0.44–1.00)
GFR calc Af Amer: 47 mL/min — ABNORMAL LOW (ref >60)
GFR, EST NON AFRICAN AMERICAN: 41 mL/min — AB (ref >60)
Glucose, Bld: 141 mg/dL — ABNORMAL HIGH (ref 65–99)
POTASSIUM: 4 mmol/L (ref 3.5–5.1)
SODIUM: 135 mmol/L (ref 135–145)
TOTAL PROTEIN: 6.9 g/dL (ref 6.5–8.1)
Total Bilirubin: 0.7 mg/dL (ref 0.3–1.2)

## 2015-09-28 LAB — CBC WITH DIFFERENTIAL/PLATELET
BASOS ABS: 0.1 10*3/uL (ref 0–0.1)
BASOS PCT: 1 %
EOS PCT: 4 %
Eosinophils Absolute: 0.3 10*3/uL (ref 0–0.7)
HCT: 32.5 % — ABNORMAL LOW (ref 35.0–47.0)
Hemoglobin: 10.8 g/dL — ABNORMAL LOW (ref 12.0–16.0)
Lymphocytes Relative: 15 %
Lymphs Abs: 1.1 10*3/uL (ref 1.0–3.6)
MCH: 29 pg (ref 26.0–34.0)
MCHC: 33.3 g/dL (ref 32.0–36.0)
MCV: 87.2 fL (ref 80.0–100.0)
MONO ABS: 0.8 10*3/uL (ref 0.2–0.9)
Monocytes Relative: 10 %
Neutro Abs: 5.2 10*3/uL (ref 1.4–6.5)
Neutrophils Relative %: 70 %
PLATELETS: 185 10*3/uL (ref 150–440)
RBC: 3.73 MIL/uL — AB (ref 3.80–5.20)
RDW: 17 % — AB (ref 11.5–14.5)
WBC: 7.4 10*3/uL (ref 3.6–11.0)

## 2015-09-29 DIAGNOSIS — E119 Type 2 diabetes mellitus without complications: Secondary | ICD-10-CM | POA: Diagnosis not present

## 2015-09-29 LAB — GLUCOSE, CAPILLARY
GLUCOSE-CAPILLARY: 163 mg/dL — AB (ref 65–99)
GLUCOSE-CAPILLARY: 236 mg/dL — AB (ref 65–99)
Glucose-Capillary: 122 mg/dL — ABNORMAL HIGH (ref 65–99)
Glucose-Capillary: 228 mg/dL — ABNORMAL HIGH (ref 65–99)

## 2015-09-30 LAB — GLUCOSE, CAPILLARY
GLUCOSE-CAPILLARY: 247 mg/dL — AB (ref 65–99)
GLUCOSE-CAPILLARY: 171 mg/dL — AB (ref 65–99)
Glucose-Capillary: 156 mg/dL — ABNORMAL HIGH (ref 65–99)
Glucose-Capillary: 132 mg/dL — ABNORMAL HIGH (ref 65–99)
Glucose-Capillary: 232 mg/dL — ABNORMAL HIGH (ref 65–99)

## 2015-10-01 DIAGNOSIS — E119 Type 2 diabetes mellitus without complications: Secondary | ICD-10-CM | POA: Diagnosis not present

## 2015-10-01 LAB — GLUCOSE, CAPILLARY
GLUCOSE-CAPILLARY: 120 mg/dL — AB (ref 65–99)
GLUCOSE-CAPILLARY: 175 mg/dL — AB (ref 65–99)
GLUCOSE-CAPILLARY: 161 mg/dL — AB (ref 65–99)
Glucose-Capillary: 235 mg/dL — ABNORMAL HIGH (ref 65–99)

## 2015-10-02 DIAGNOSIS — E119 Type 2 diabetes mellitus without complications: Secondary | ICD-10-CM | POA: Diagnosis not present

## 2015-10-02 LAB — GLUCOSE, CAPILLARY
GLUCOSE-CAPILLARY: 186 mg/dL — AB (ref 65–99)
GLUCOSE-CAPILLARY: 183 mg/dL — AB (ref 65–99)
Glucose-Capillary: 146 mg/dL — ABNORMAL HIGH (ref 65–99)

## 2015-10-03 DIAGNOSIS — E119 Type 2 diabetes mellitus without complications: Secondary | ICD-10-CM | POA: Diagnosis not present

## 2015-10-03 LAB — GLUCOSE, CAPILLARY
GLUCOSE-CAPILLARY: 133 mg/dL — AB (ref 65–99)
GLUCOSE-CAPILLARY: 171 mg/dL — AB (ref 65–99)
GLUCOSE-CAPILLARY: 241 mg/dL — AB (ref 65–99)
GLUCOSE-CAPILLARY: 123 mg/dL — AB (ref 65–99)

## 2015-10-05 DIAGNOSIS — E119 Type 2 diabetes mellitus without complications: Secondary | ICD-10-CM | POA: Diagnosis not present

## 2015-10-05 LAB — GLUCOSE, CAPILLARY
GLUCOSE-CAPILLARY: 114 mg/dL — AB (ref 65–99)
GLUCOSE-CAPILLARY: 260 mg/dL — AB (ref 65–99)
GLUCOSE-CAPILLARY: 80 mg/dL (ref 65–99)
Glucose-Capillary: 197 mg/dL — ABNORMAL HIGH (ref 65–99)
Glucose-Capillary: 233 mg/dL — ABNORMAL HIGH (ref 65–99)

## 2015-10-10 NOTE — Addendum Note (Signed)
Addended by: Karie Fetch on: 10/10/2015 08:30 AM   Modules accepted: Orders

## 2015-10-10 NOTE — Progress Notes (Signed)
Pulmonary Individual Treatment Plan  Patient Details  [name] MRN: [id number] Date of Birth: Jul 17, 1924 Referring Provider:  Dr Wendie Chess  Initial Encounter Date:       Pulmonary Rehab from 05/23/2015 in [place]   Date  05/23/15      Visit Diagnosis: Acute on chronic congestive heart failure, unspecified congestive heart failure type ([place])  Patient's Home Medications on Admission:  Current outpatient prescriptions:    apixaban (ELIQUIS) 5 MG TABS tablet, Take 5 mg by mouth 2 (two) times daily., Disp: , Rfl:    budesonide-formoterol (SYMBICORT) 160-4.5 MCG/ACT inhaler, Inhale 2 puffs into the lungs 2 (two) times daily., Disp: 1 Inhaler, Rfl: 12   carvedilol (COREG) 6.25 MG tablet, Take 6.25 mg by mouth 2 (two) times daily with a meal. , Disp: , Rfl:    cholecalciferol (VITAMIN D) 1000 units tablet, Take 1,000 Units by mouth daily., Disp: , Rfl:    docusate sodium (COLACE) 100 MG capsule, Take 100 mg by mouth 2 (two) times daily. , Disp: , Rfl:    doxycycline (VIBRA-TABS) 100 MG tablet, Take 1 tablet (100 mg total) by mouth every 12 (twelve) hours., Disp: 8 tablet, Rfl: 0   escitalopram (LEXAPRO) 10 MG tablet, Take 10 mg by mouth daily., Disp: , Rfl:    fentaNYL (DURAGESIC - DOSED MCG/HR) 50 MCG/HR, Place 50 mcg onto the skin every other day. , Disp: , Rfl:    ferrous sulfate 325 (65 FE) MG tablet, Take 325 mg by mouth daily with breakfast., Disp: , Rfl:    furosemide (LASIX) 40 MG tablet, Take 40 mg by mouth every other day. Pt alternates with the 4m tablet., Disp: , Rfl:    furosemide (LASIX) 80 MG tablet, Take 80 mg by mouth every other day. Pt alternates with the 473mtablet., Disp: , Rfl:    glimepiride (AMARYL) 4 MG tablet, Take 4 mg by mouth 2 (two) times daily after a meal., Disp: , Rfl:    losartan (COZAAR) 25 MG tablet, Take 25 mg by mouth daily., Disp: , Rfl:    magnesium oxide (MAG-OX) 400 MG  tablet, Take 400 mg by mouth daily., Disp: , Rfl:    Melatonin 1 MG TABS, Take 1 mg by mouth at bedtime., Disp: , Rfl:    metolazone (ZAROXOLYN) 2.5 MG tablet, Take 2.5 mg by mouth 2 (two) times a week. Pt takes on Monday and Friday., Disp: , Rfl:    oxyCODONE (OXY IR/ROXICODONE) 5 MG immediate release tablet, Take 5-10 mg by mouth every 4 (four) hours as needed for severe pain. , Disp: , Rfl:    pantoprazole (PROTONIX) 40 MG tablet, Take 40 mg by mouth daily., Disp: , Rfl:    predniSONE (STERAPRED UNI-PAK 21 TAB) 10 MG (21) TBPK tablet, Take 1 tablet (10 mg total) by mouth daily. Start 60 mg po daily, taper 10 mg daily until done, Disp: 21 tablet, Rfl: 0   rOPINIRole (REQUIP) 0.5 MG tablet, Take 0.5 mg by mouth 2 (two) times daily., Disp: , Rfl:    sitaGLIPtin (JANUVIA) 100 MG tablet, Take 100 mg by mouth daily., Disp: , Rfl:    spironolactone (ALDACTONE) 25 MG tablet, Take 25 mg by mouth daily. , Disp: , Rfl:    vitamin B-12 (CYANOCOBALAMIN) 1000 MCG tablet, Take 1,000 mcg by mouth daily., Disp: , Rfl:    vitamin C (ASCORBIC ACID) 500 MG tablet, Take 500 mg by mouth daily., Disp: , Rfl:  Vitamin D, Ergocalciferol, (DRISDOL) 50000 UNITS CAPS capsule, Take 50,000 Units by mouth every 7 (seven) days. Pt takes on Thursday., Disp: , Rfl:   Past Medical History: Past Medical History  Diagnosis Date   CHF (congestive heart failure) ([place])    Diabetes mellitus without complication ([place])    Hypertension    Chronic back pain    Cancer ([place])     skin ca    Tobacco Use: History  Smoking status   Never Smoker   Smokeless tobacco   Never Used    Labs: Recent Review Flowsheet Data    Labs for ITP Cardiac and Pulmonary Rehab Latest Ref Rng 03/04/2012 03/20/2015 09/06/2015   Cholestrol 0-200 mg/dL 101 - -   LDLCALC 0-100 mg/dL 60 - -   HDL 40-60 mg/dL 23(L) - -   Trlycerides 0-200 mg/dL 90 - -   Hemoglobin A1c 4.0 - 6.0 % 7.7(H) 7.0(H) -   HCO3 21.0 - 28.0 mEq/L - - 34.4(H)          POCT Glucose      05/29/15 1000 05/31/15 1000 07/26/15 1432       POCT Blood Glucose   Pre-Exercise 206 mg/dL  Muffin, grits, and coffee; Fasting Glucose 144 201 mg/dL      Post-Exercise 189 mg/dL 170 mg/dL      Pre-Exercise #3   223 mg/dL     Post-Exercise #3   158 mg/dL        ADL UCSD:     Pulmonary Assessment Scores      05/23/15 1100       ADL UCSD   ADL Phase Entry     SOB Score total 14     Rest 0     Walk 0     Stairs 1     Bath 1     Dress 1     Shop 2        Pulmonary Function Assessment:     Pulmonary Function Assessment - 05/23/15 1100    Initial Spirometry Results   FVC% 47 %   FEV1% 20 %   FEV1/FVC Ratio 100   Breath   Bilateral Breath Sounds Clear;Decreased      Exercise Target Goals:    Exercise Program Goal: Individual exercise prescription set with THRR, safety & activity barriers. Participant demonstrates ability to understand and report RPE using BORG scale, to self-measure pulse accurately, and to acknowledge the importance of the exercise prescription.  Exercise Prescription Goal: Starting with aerobic activity 30 plus minutes a day, 3 days per week for initial exercise prescription. Provide home exercise prescription and guidelines that participant acknowledges understanding prior to discharge.  Activity Barriers & Risk Stratification:     Activity Barriers & Cardiac Risk Stratification - 05/23/15 1100    Activity Barriers & Cardiac Risk Stratification   Activity Barriers Assistive Device;Deconditioning;Muscular Weakness   Cardiac Risk Stratification Moderate      6 Minute Walk:     6 Minute Walk      05/23/15 1449       6 Minute Walk   Phase Initial     Distance 575 feet     Walk Time 6 minutes     RPE 11     Perceived Dyspnea  2     Symptoms No     Resting HR 74 bpm     Resting BP 118/72 mmHg     Max Ex. HR 77 bpm     Max Ex.  BP 124/72 mmHg        Initial Exercise Prescription:     Initial  Exercise Prescription - 05/23/15 1400    Date of Initial Exercise RX and Referring Provider   Date 05/23/15   Treadmill   MPH 1.5   Grade 0   Minutes 10   Recumbant Bike   Level 2   RPM 40   Watts 20   Minutes 10   NuStep   Level 2   Watts 40   Minutes 10   Arm Ergometer   Level 1   Watts 10   Minutes 10   Recumbant Elliptical   Level 1   RPM 40   Watts 20   Minutes 10   REL-XR   Level 2   Watts 40   Minutes 10   T5 Nustep   Level 1   Watts 15   Minutes 10   Biostep-RELP   Level 2   Watts 40   Minutes 10   Prescription Details   Frequency (times per week) 3   Duration Progress to 30 minutes of continuous aerobic without signs/symptoms of physical distress   Intensity   THRR REST +  30   Ratings of Perceived Exertion 11-15   Perceived Dyspnea 2-4   Progression   Progression Continue progressive overload as per policy without signs/symptoms or physical distress.   Resistance Training   Training Prescription Yes   Weight 2   Reps 10-15      Perform Capillary Blood Glucose checks as needed.  Exercise Prescription Changes:     Exercise Prescription Changes      05/29/15 1000 05/31/15 1000 07/26/15 1000 07/31/15 1000 08/09/15 1300   Exercise Review   Progression  No No Yes Yes   Response to Exercise   Blood Pressure (Admit) 120/68 mmHg    120/70 mmHg   Blood Pressure (Exercise) 124/64 mmHg    118/62 mmHg   Blood Pressure (Exit) 122/64 mmHg    90/62 mmHg   Heart Rate (Admit) 77 bpm    75 bpm   Heart Rate (Exercise) 97 bpm    91 bpm   Heart Rate (Exit) 73 bpm    75 bpm   Oxygen Saturation (Admit) 98 %    94 %   Oxygen Saturation (Exercise) 91 %    92 %   Oxygen Saturation (Exit) 91 %    90 %   Rating of Perceived Exertion (Exercise) 13    11   Perceived Dyspnea (Exercise) 0    0   Symptoms Omitted Arm Ergometer due to it being painful to back. This is due to chronic pain.  Both knees were sore from fall Some back and knee pain residual from her fall  in December Complained of some tiredness today. Completed the exercise with intervals and took rests when she needed them  Chronic pain   Comments Patient's first day of class. The initial exercise prescription was discussed with the patient. Exercise machine usage and safety were also discussed with the patient.  Patient had sore knees from a fall so her exercise prescription was adjusted slightly. She was able to perform the same workloads, but had to go slower on while stepping on the NS and BS.  Patient was able to resume previous workloads after being out for over a month which was encouraging to her.  Reviewed individualized exercise prescription and made increases per departmental policy. Exercise increases were discussed with the patient and they  were able to perform the new work loads without issue (no signs or symptoms).  York Cerise is making small progressions. Her back and knee pain and overall fragility make more aggressive progression not possible and not practical. York Cerise is using time intervals to exercise for longer periods and to build her endurance. She cannot exercise continuously for more than 10-15 minutes and our first goals is to steadily increase her time and build her endurance.    Duration Progress to 30 minutes of continuous aerobic without signs/symptoms of physical distress Progress to 30 minutes of continuous aerobic without signs/symptoms of physical distress Progress to 30 minutes of continuous aerobic without signs/symptoms of physical distress Progress to 30 minutes of continuous aerobic without signs/symptoms of physical distress Progress to 30 minutes of continuous aerobic without signs/symptoms of physical distress   Intensity Rest + 30 Rest + 30 Rest + 30 Rest + 30 Rest + 30   Progression   Progression Continue progressive overload as per policy without signs/symptoms or physical distress. Continue progressive overload as per policy without signs/symptoms or physical distress.  Continue progressive overload as per policy without signs/symptoms or physical distress. Continue progressive overload as per policy without signs/symptoms or physical distress. Continue progressive overload as per policy without signs/symptoms or physical distress.   Resistance Training   Training Prescription (read-only) _0    Weight (read-only) _1 Reps (read-only) 10-15 10-15 10-15 10-15 10-15   NuStep   Level (read-only) _2 Watts (read-only) 40 40 _3 Minutes (read-only) _4 Biostep-RELP   Level (read-only) _5 Watts (read-only) 40 40 _6 Minutes (read-only) _7 08/14/15 1000 08/16/15 1000 08/30/15 0600       Exercise Review   Progression  Yes Yes     Response to Exercise   Blood Pressure (Admit)   136/62 mmHg     Blood Pressure (Exercise)   122/62 mmHg     Blood Pressure (Exit)   120/62 mmHg     Heart Rate (Admit)   76 bpm     Heart Rate (Exercise)   78 bpm     Heart Rate (Exit)   72 bpm     Oxygen Saturation (Admit)   91 %     Oxygen Saturation (Exercise)   95 %     Oxygen Saturation (Exit)   90 %     Rating of Perceived Exertion (Exercise)   11     Perceived Dyspnea (Exercise)   0     Symptoms Chronic pain Chronic pain Chronic Pain     Comments Maintaining time increases and doing well on the machines.  York Cerise is showing progress in her stamina and can tolerate longer exercise times on the machines.       Duration Progress to 30 minutes of continuous aerobic without signs/symptoms of physical distress Progress to 30 minutes of continuous aerobic without signs/symptoms of physical distress Progress to 45 minutes of aerobic exercise without signs/symptoms of physical distress     Intensity Rest + 30 Rest + 30 THRR unchanged     Progression   Progression Continue progressive overload as per policy without signs/symptoms or physical distress. Continue progressive overload as per policy without  signs/symptoms or physical distress. Continue progressive overload as per policy without signs/symptoms or physical  distress.     Resistance Training   Training Prescription (read-only) Yes Yes      Weight (read-only) 1 1      Reps (read-only) 10-15 10-15      NuStep   Level   2     Watts   40     Minutes   20     Level (read-only) 2 2      Watts (read-only) 20 20      Minutes (read-only) 12 12      Biostep-RELP   Level   2     Watts   20     Minutes   20     Level (read-only) 2 2      Watts (read-only) 20 20      Minutes (read-only) 10 15         Exercise Comments:   Discharge Exercise Prescription (Final Exercise Prescription Changes):     Exercise Prescription Changes - 08/30/15 0600    Exercise Review   Progression Yes   Response to Exercise   Blood Pressure (Admit) 136/62 mmHg   Blood Pressure (Exercise) 122/62 mmHg   Blood Pressure (Exit) 120/62 mmHg   Heart Rate (Admit) 76 bpm   Heart Rate (Exercise) 78 bpm   Heart Rate (Exit) 72 bpm   Oxygen Saturation (Admit) 91 %   Oxygen Saturation (Exercise) 95 %   Oxygen Saturation (Exit) 90 %   Rating of Perceived Exertion (Exercise) 11   Perceived Dyspnea (Exercise) 0   Symptoms Chronic Pain   Duration Progress to 45 minutes of aerobic exercise without signs/symptoms of physical distress   Intensity THRR unchanged   Progression   Progression Continue progressive overload as per policy without signs/symptoms or physical distress.   NuStep   Level 2   Watts 40   Minutes 20   Biostep-RELP   Level 2   Watts 20   Minutes 20       Nutrition:  Target Goals: Understanding of nutrition guidelines, daily intake of sodium <1567m, cholesterol <2020m calories 30% from fat and 7% or less from saturated fats, daily to have 5 or more servings of fruits and vegetables.  Biometrics:     Pre Biometrics - 05/23/15 1453    Pre Biometrics   Height _0  (1.702 m)   Weight 165 lb 12.8 oz (75.206 kg)   Waist  Circumference 37 inches   Hip Circumference 43 inches   Waist to Hip Ratio 0.86 %   BMI (Calculated) 26       Nutrition Therapy Plan and Nutrition Goals:     Nutrition Therapy & Goals - 05/23/15 1100    Nutrition Therapy   Diet Prefers not to meet with the dietitian      Nutrition Discharge: Rate Your Plate Scores:   Psychosocial: Target Goals: Acknowledge presence or absence of depression, maximize coping skills, provide positive support system. Participant is able to verbalize types and ability to use techniques and skills needed for reducing stress and depression.  Initial Review & Psychosocial Screening:     Initial Psych Review & Screening - 05/23/15 1100    Family Dynamics   Good Support System? Yes   Comments Ms ClHiseas good support from her husband and states she has no depression.   Barriers   Psychosocial barriers to participate in program There are no identifiable barriers or psychosocial needs.;The patient should benefit from training in stress management and relaxation.   Screening Interventions  Interventions Encouraged to exercise      Quality of Life Scores:     Quality of Life - 05/23/15 1100    Quality of Life Scores   Health/Function Pre 12.2 %   Socioeconomic Pre 20.57 %   Psych/Spiritual Pre 16.29 %   Family Pre 14.4 %   GLOBAL Pre 15.09 %      PHQ-9:     Recent Review Flowsheet Data    Depression screen [facility] 2/9 05/23/2015 04/10/2015   Decreased Interest 1 3   Down, Depressed, Hopeless 2 0   PHQ - 2 Score 3 3   Altered sleeping 3 3   Tired, decreased energy 2 3   Change in appetite 0 0   Feeling bad or failure about yourself  2 3   Trouble concentrating 0 0   Moving slowly or fidgety/restless 1 0   Suicidal thoughts 1 0   PHQ-9 Score 12 12   Difficult doing work/chores Somewhat difficult Somewhat difficult      Psychosocial Evaluation and Intervention:     Psychosocial Evaluation - 05/31/15 1100    Psychosocial  Evaluation & Interventions   Interventions Encouraged to exercise with the program and follow exercise prescription;Relaxation education;Stress management education   Comments Counselor met with Ms. Cortner today for initial psychosocial evaluation.  She is an 82 year old who has congestive heart failure and was recommended to attend this program by her Dr.  Jamesetta So. Carlis Abbott has a strong support system with a spouse of 40 years and several adult children who check in on her by phone daily.  She also has help that comes in 4 hours/day and is a part of a faith community.  Ms. Lansberry  struggles with chronic back pain subsequent to surgery in 2009.  She reports this pain prevents sound sleep for her, even though she is taking medication for pain at bedtime.  Ms. Zuleta denies a history of depression or anxiety or current symptoms, other than she admits to "worrying a lot."  she is on Lexapro 10 mg. for this and states it helps some.  She reports her stressors are "worrying" about her spouse who has multiple health issues, including back problems, and her 25 year old son who was paralyzed 7 years ago and lives in [place].  Ms. Borbon has goals to increase the strength in her legs and be able to "reitre" her walker and get back to her cane for support.  Counselor encouraged her to consistently exercise to accomplish this goal.  Also, she will benefit from the stress management and relaxation psychoeducational components of this program.     Continued Psychosocial Services Needed Yes  Ms. Willig will benefit from consistent exercise, and psychoeducation on relaxation and stress management particularly.  Counselor will follow up with Ms. Carlis Abbott on these.       Psychosocial Re-Evaluation:     Psychosocial Re-Evaluation      08/02/15 1101 08/14/15 1057 08/23/15 1000       Psychosocial Re-Evaluation   Interventions Encouraged to attend Cardiac Rehabilitation for the exercise       Comments Dizziness at times with exercise so  Jaira also said she has chronic back pain that limits her that she wishes she doesn't have but she reports she has learned to live with it.  Counselor met with Ms. Minassian for follow up evaluation.  She reports some increased strength since beginning this class.  She continues to worry a great deal about her spouse's health  which has decilined.  She continues to have good support with help that comes in 3 times each week.  Her daughter from [place] will be coming this Wednesday to  support her spouse due to his medical procedures scheduled for this Friday.  Ms. Hamada states her grandson is getting married April 1st and she is hoping she and spouse will be well enough to attend.  Counselor will continue to follow with Ms. Muckey in the future.   Ms Sholtz is very stressed with her husbands health. His injections in his back have not helped, and he now has a blocked caroid artery. I will speak with our mental health councilor to check with Ms Magar. Through this all, she still attends [facility] regularly, and it seems to help her emotionally.       Education: Education Goals: Education classes will be provided on a weekly basis, covering required topics. Participant will state understanding/return demonstration of topics presented.  Learning Barriers/Preferences:     Learning Barriers/Preferences - 05/23/15 1100    Learning Barriers/Preferences   Learning Barriers None   Learning Preferences Group Instruction;Individual Instruction;Pictoral;Skilled Demonstration;Verbal Instruction;Video;Written Material      Education Topics: Initial Evaluation Education: - Verbal, written and demonstration of respiratory meds, RPE/PD scales, oximetry and breathing techniques. Instruction on use of nebulizers and MDIs: cleaning and proper use, rinsing mouth with steroid doses and importance of monitoring MDI activations.          Pulmonary Rehab from 08/16/2015 in [facility] Cardiac and Pulmonary Rehab   Date  05/23/15    Educator  LB   Instruction Review Code  2- meets goals/outcomes      General Nutrition Guidelines/Fats and Fiber: -Group instruction provided by verbal, written material, models and posters to present the general guidelines for heart healthy nutrition. Gives an explanation and review of dietary fats and fiber.      Pulmonary Rehab from 08/16/2015 in [facility] Cardiac and Pulmonary Rehab   Date  07/31/15 Marcelina Morel attended 05/29/15]   Educator  CR   Instruction Review Code  2- meets goals/outcomes      Controlling Sodium/Reading Food Labels: -Group verbal and written material supporting the discussion of sodium use in heart healthy nutrition. Review and explanation with models, verbal and written materials for utilization of the food label.   Exercise Physiology & Risk Factors: - Group verbal and written instruction with models to review the exercise physiology of the cardiovascular system and associated critical values. Details cardiovascular disease risk factors and the goals associated with each risk factor.      Pulmonary Rehab from 08/16/2015 in [facility] Cardiac and Pulmonary Rehab   Date  05/31/15   Educator  SW   Instruction Review Code  2- meets goals/outcomes      Aerobic Exercise & Resistance Training: - Gives group verbal and written discussion on the health impact of inactivity. On the components of aerobic and resistive training programs and the benefits of this training and how to safely progress through these programs.   Flexibility, Balance, General Exercise Guidelines: - Provides group verbal and written instruction on the benefits of flexibility and balance training programs. Provides general exercise guidelines with specific guidelines to those with heart or lung disease. Demonstration and skill practice provided.   Stress Management: - Provides group verbal and written instruction about the health risks of elevated stress, cause of high stress, and healthy ways to reduce  stress.      Pulmonary Rehab from 08/16/2015 in [facility] Cardiac  and Pulmonary Rehab   Date  08/16/15   Educator  [facility]   Instruction Review Code  2- meets goals/outcomes      Depression: - Provides group verbal and written instruction on the correlation between heart/lung disease and depressed mood, treatment options, and the stigmas associated with seeking treatment.   Exercise & Equipment Safety: - Individual verbal instruction and demonstration of equipment use and safety with use of the equipment.      Pulmonary Rehab from 08/16/2015 in [facility] Cardiac and Pulmonary Rehab   Date  05/29/15   Educator  LB   Instruction Review Code  2- meets goals/outcomes      Infection Prevention: - Provides verbal and written material to individual with discussion of infection control including proper hand washing and proper equipment cleaning during exercise session.      Pulmonary Rehab from 08/16/2015 in [facility] Cardiac and Pulmonary Rehab   Date  05/29/15   Educator  LB   Instruction Review Code  2- meets goals/outcomes      Falls Prevention: - Provides verbal and written material to individual with discussion of falls prevention and safety.      Pulmonary Rehab from 08/16/2015 in [facility] Cardiac and Pulmonary Rehab   Date  05/23/15   Educator  LB   Instruction Review Code  2- meets goals/outcomes      Diabetes: - Individual verbal and written instruction to review signs/symptoms of diabetes, desired ranges of glucose level fasting, after meals and with exercise. Advice that pre and post exercise glucose checks will be done for 3 sessions at entry of program.   Chronic Lung Diseases: - Group verbal and written instruction to review new updates, new respiratory medications, new advancements in procedures and treatments. Provide informative websites and "800" numbers of self-education.   Lung Procedures: - Group verbal and written instruction to describe testing methods done to diagnose lung  disease. Review the outcome of test results. Describe the treatment choices: Pulmonary Function Tests, ABGs and oximetry.   Energy Conservation: - Provide group verbal and written instruction for methods to conserve energy, plan and organize activities. Instruct on pacing techniques, use of adaptive equipment and posture/positioning to relieve shortness of breath.   Triggers: - Group verbal and written instruction to review types of environmental controls: home humidity, furnaces, filters, dust mite/pet prevention, HEPA vacuums. To discuss weather changes, air quality and the benefits of nasal washing.   Exacerbations: - Group verbal and written instruction to provide: warning signs, infection symptoms, calling MD promptly, preventive modes, and value of vaccinations. Review: effective airway clearance, coughing and/or vibration techniques. Create an Sports administrator.   Oxygen: - Individual and group verbal and written instruction on oxygen therapy. Includes supplement oxygen, available portable oxygen systems, continuous and intermittent flow rates, oxygen safety, concentrators, and Medicare reimbursement for oxygen.   Respiratory Medications: - Group verbal and written instruction to review medications for lung disease. Drug class, frequency, complications, importance of spacers, rinsing mouth after steroid MDI's, and proper cleaning methods for nebulizers.   AED/CPR: - Group verbal and written instruction with the use of models to demonstrate the basic use of the AED with the basic ABC's of resuscitation.   Breathing Retraining: - Provides individuals verbal and written instruction on purpose, frequency, and proper technique of diaphragmatic breathing and pursed-lipped breathing. Applies individual practice skills.      Pulmonary Rehab from 08/16/2015 in [facility]   Date  05/23/15   Educator  LB  Instruction Review Code  2- meets goals/outcomes      Anatomy  and Physiology of the Lungs: - Group verbal and written instruction with the use of models to provide basic lung anatomy and physiology related to function, structure and complications of lung disease.   Heart Failure: - Group verbal and written instruction on the basics of heart failure: signs/symptoms, treatments, explanation of ejection fraction, enlarged heart and cardiomyopathy.   Sleep Apnea: - Individual verbal and written instruction to review Obstructive Sleep Apnea. Review of risk factors, methods for diagnosing and types of masks and machines for OSA.   Anxiety: - Provides group, verbal and written instruction on the correlation between heart/lung disease and anxiety, treatment options, and management of anxiety.   Relaxation: - Provides group, verbal and written instruction about the benefits of relaxation for patients with heart/lung disease. Also provides patients with examples of relaxation techniques.   Knowledge Questionnaire Score:    Core Components/Risk Factors/Patient Goals at Admission:     Personal Goals and Risk Factors at Admission - 05/23/15 1100    Core Components/Risk Factors/Patient Goals on Admission   Sedentary Yes   Intervention (read-only) While in program, learn and follow the exercise prescription taught. Start at a low level workload and increase workload after able to maintain previous level for 30 minutes. Increase time before increasing intensity.  Ms Marsteller's goal is to increase her stamina for walking. She has a co-pay and therefore we will work her into FF with her [employer].   Improve shortness of breath with ADL's No   Develop more efficient breathing techniques such as purse lipped breathing and diaphragmatic breathing; and practicing self-pacing with activity Yes   Intervention (read-only) While in program, learn and utilize the specific breathing techniques taught to you. Continue to practice and use the techniques as  needed.  Instructed Ms Tal on PLB. When she was in Physical Therapy, she learned this technique .   Diabetes Yes   Goal Blood glucose control identified by blood glucose values, HgbA1C. Participant verbalizes understanding of the signs/symptoms of hyper/hypo glycemia, proper foot care and importance of medication and nutrition plan for blood glucose control.   Intervention (read-only) Provide nutrition & aerobic exercise along with prescribed medications to achieve blood glucose in normal ranges: Fasting 65-99 mg/dL   Hypertension Yes   Goal Participant will see blood pressure controlled within the values of 140/23m/Hg or within value directed by their physician.   Intervention (read-only) Provide nutrition & aerobic exercise along with prescribed medications to achieve BP 140/90 or less.   Understand more about Heart/Pulmonary Disease. Yes   Intervention While in program utilize professionals for any questions, and attend the education sessions. Great websites to use are www.americanheart.org or www.lung.org for reliable information.  Ms CAustellhas CHF and would like to learn more about the disease.      Core Components/Risk Factors/Patient Goals Review:      Goals and Risk Factor Review      05/29/15 1000 05/31/15 1000 07/31/15 1038 08/02/15 1000 08/02/15 1056   Core Components/Risk Factors/Patient Goals Review   Personal Goals Review Develop more efficient breathing techniques such as purse lipped breathing and diaphragmatic breathing and practicing self-pacing with activity. Increase Aerobic Exercise and Physical Activity   Diabetes;Hypertension   Increase Aerobic Exercise and Physical Activity (read-only)   Goals Progress/Improvement seen   Yes Yes  Yes   Comments  Plan to move Ms CWindholzinto FDillard'sin about a week -  she has a copay of $20.00 with each visit; she has done well on the NS and BioStep with acceptable vital signs. He husband may join FF as well. Ms Araque has returned to  class after an extended absense due to a fall and knee injury. She stated that her knee is tolerating the exercise well. Options for [employer] classes were discussed with Ms Blando as she has a $20 copay.  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  Explained to Ms Heber that there is a waiting list for the 1:15 FF; she will continue in [place] and wait for the opening. Markisha said she was dizzy again today but after blood pressure was 110/70 with pulse 76 and non fasting fingerstick blood sugar was 192. (Had breakfast this am).Janaye was able to go exercise on the Biostep sitting down after her dizziness passed after 360 cc of water.    Understand more about Heart/Pulmonary Disease (read-only)   Goals Progress/Improvement seen      Yes   Comments     I asked Ivyana if she is on a fluid restriction since some people with Heart failure are. She reported no so given 360 cc water per above after she c/o dizziness. Jobe Gibbon denies vertigo history.    Breathing Techniques (read-only)   Comments Ms Foland using PLB, more for pain than shortness of breath.       Diabetes (read-only)   Progress seen towards goals     Yes   Comments     Blood sugar after breakfast today when she c./o dizziness was 192. Lou had breakfast this am.    Hypertension (read-only)   Progress seen toward goals     Yes   Comments     Blood pressure today when she c/o dizziness was 110/70     08/02/15 1121 08/09/15 1101 08/23/15 1000       Core Components/Risk Factors/Patient Goals Review   Personal Goals Review Diabetes;Hypertension Improve shortness of breath with ADL's;Develop more efficient breathing techniques such as purse lipped breathing and diaphragmatic breathing and practicing self-pacing with activity. Sedentary     Review   Ms Huntoon has improved with her exercise to 67mn/ NS and BioStep. She is stronger with her walk in and out and even requested an  increase in her Level on the BioStep.     Expected Outcomes   Continue increasing resistance on the NS and BioStep within the next 2 weeks.     Improve shortness of breath with ADL's (read-only)   Goals Progress/Improvement seen   Met      Comments  LYork Cerisestates she does not experience SOB symptoms. She stated that she does tire easily. She does stop when she is tired and will slow down and pace herself to prevent herself from gettin overtired.  She hopes to see an increase in  stamina as she continues with the exercise prescription with the program.      Breathing Techniques (read-only)   Goals Progress/Improvement seen   Yes      Comments  Reviewed the technique for PLB with LYork Cerisetoday.  She stared and demonstrated good PLB technique.  Reminded her to use this throughout her exercise and when exerting herself in any activity.      Diabetes (read-only)   Goal Blood glucose control identified by blood glucose values, HgbA1C. Participant verbalizes understanding of the signs/symptoms of  hyper/hypo glycemia, proper foot care and importance of medication and nutrition plan for blood glucose control.       Progress seen towards goals Yes       Comments Lou sysates her A1c is around 7.4%.  She has maintained good control with ther blood sugars and is aware of her meds and nutriton/exercise requirements to help maintain good Blood sugar contro.       Hypertension (read-only)   Goal Participant will see blood pressure controlled within the values of 140/44m/Hg or within value directed by their physician.       Progress seen toward goals Yes       Comments BP ranges are in good ranges by chart review.  LYork Cerisetalked about keeping up with her weight every day and when to call her MD about 3 pounds weight gain. "I take Lasix 80 mg one day and then 40 mg the next"    LYork Ceriseis aware of what she needs to do by keeping up with er meds/nutrition and exercise to help keep her BP in noraml range.           Core  Components/Risk Factors/Patient Goals at Discharge (Final Review):      Goals and Risk Factor Review - 08/23/15 1000    Core Components/Risk Factors/Patient Goals Review   Personal Goals Review Sedentary   Review Ms CWrobleskihas improved with her exercise to 277m/ NS and BioStep. She is stronger with her walk in and out and even requested an increase in her Level on the BioStep.   Expected Outcomes Continue increasing resistance on the NS and BioStep within the next 2 weeks.      ITP Comments:     ITP Comments      06/20/15 1133 07/26/15 1000 07/26/15 1053 07/31/15 1000 07/31/15 1023   ITP Comments Ms ClIknerast attended 05/31/2015. She has had some knee problems, but plans to return the first of the year. Ms ClPasquaeturned to LuWm. Wrigley Jr. Companyfter a month of resting her lt knee; she exercised 32 minutes and omitted weights; a good first day back to exercise. Personal and exercise goals expected to be met in 32 more sessions. Progress on specific individualized goals will be charted in patient's ITP. Upon completion of the program the patient will be comfortable managing exercise goals and progression on their own.  Ms ClSchisslereft early today; she was tired and had episodes of dizziness; glucous level checked 174; Ms ClObriants under a lot of stress with family. Personal and exercise goals expected to be met in 31 more sessions. Progress on specific individualized goals will be charted in patient's ITP. Upon completion of the program the patient will be comfortable managing exercise goals and progression on their own.      08/02/15 1102 08/09/15 1100 08/14/15 1035 08/16/15 1058 08/30/15 1614   ITP Comments Taylia said she was dizzy again today but after blood pressure was 110/70 with pulse 76 and non fasting fingerstick blood sugar was 192. (Had breakfast this am).MaKayelynnas able to go exercise on the Biostep sitting down after her dizziness passed after 360 cc of water.  Expected to meet program and personal  goals by end of program in 29 more sessions. Personal and exercise goals expected to be met in 28 more sessions. Progress on specific individualized goals will be charted in patient's ITP. Upon completion of the program the patient will be comfortable managing exercise goals and progression on their own.  Personal and  exercise goals expected to be met in 27 more sessions. Progress on specific individualized goals will be charted in patient's ITP. Upon completion of the program the patient will be comfortable managing exercise goals and progression on their own.  Ms Robart called and states she fell and hit her head on the bed and would not be able to exercise this week.      Comments: 30 day note review

## 2015-10-17 ENCOUNTER — Ambulatory Visit
Admission: EM | Admit: 2015-10-17 | Discharge: 2015-10-17 | Disposition: A | Discharge status: Home / Self Care | Payer: Medicare Other | Attending: Family Medicine | Admitting: Family Medicine

## 2015-10-17 DIAGNOSIS — S81812A Laceration without foreign body, left lower leg, initial encounter: Secondary | ICD-10-CM | POA: Diagnosis not present

## 2015-10-17 MED ORDER — MUPIROCIN 2 % EX OINT: 1.0000 "application " | TOPICAL_OINTMENT | Freq: Three times a day (TID) | CUTANEOUS | Status: DC

## 2015-10-17 MED ORDER — DOXYCYCLINE HYCLATE 50 MG PO CAPS: 50.0000 mg | ORAL_CAPSULE | Freq: Two times a day (BID) | ORAL | Status: DC

## 2015-10-17 NOTE — ED Provider Notes (Signed)
CSN: [id number]     Arrival date & time 10/17/15  1622 History   First MD Initiated Contact with Patient 10/17/15 1812     Chief Complaint  Patient presents with  . Extremity Laceration    Pt was hit by a car door on left shin and sustained a laceration. No active bleeding in triage. Denies pain   (Consider location/radiation/quality/duration/timing/severity/associated sxs/prior Treatment) HPI  This 82 year old female diabetic presents with a laceration on the lateral lower left leg which occurred when a door opened up on her leg causing a laceration. It bled at first but is now not bleeding. He had a similar accident to her opposite leg about 1 year ago and received a tetanus shot in the emergency department they think.        Past Medical History  Diagnosis Date  . CHF (congestive heart failure) ([place])   . Diabetes mellitus without complication ([place])   . Hypertension   . Chronic back pain   . Cancer ([place])     skin ca   Past Surgical History  Procedure Laterality Date  . Pacemaker placement    . Leg surgery    . Abdominal hysterectomy    . Tonsillectomy    . Back surgery      x3  . Coronary stent placement      unknown location per pt  . Foot surgery     Family History  Problem Relation Age of Onset  . CVA Mother   . Diabetes Mellitus II Sister   . Heart disease Father    Social History  Substance Use Topics  . Smoking status: Never Smoker   . Smokeless tobacco: Never Used  . Alcohol Use: No   OB History    No data available     Review of Systems  Constitutional: Positive for activity change. Negative for fever, chills and fatigue.  Skin: Positive for wound.  All other systems reviewed and are negative.   Allergies  Ceftriaxone; Cefuroxime; Cephalosporins; Codeine; Epinephrine; Morphine and related; Buprenorphine hcl; Ciprofloxacin; Latex; Lidocaine; and Procaine  Home Medications   Prior to Admission medications   Medication Sig Start Date End Date  Taking? Authorizing Provider  apixaban (ELIQUIS) 5 MG TABS tablet Take 5 mg by mouth 2 (two) times daily.   Yes Historical Provider, MD  baclofen (LIORESAL) 10 MG tablet Take 10 mg by mouth 3 (three) times daily.   Yes Historical Provider, MD  carvedilol (COREG) 6.25 MG tablet Take 6.25 mg by mouth 2 (two) times daily with a meal.    Yes Historical Provider, MD  cholecalciferol (VITAMIN D) 1000 units tablet Take 1,000 Units by mouth daily.   Yes Historical Provider, MD  docusate sodium (COLACE) 100 MG capsule Take 100 mg by mouth 2 (two) times daily.    Yes Historical Provider, MD  fentaNYL (DURAGESIC - DOSED MCG/HR) 50 MCG/HR Place 50 mcg onto the skin every other day.    Yes Historical Provider, MD  ferrous sulfate 325 (65 FE) MG tablet Take 325 mg by mouth daily with breakfast.   Yes Historical Provider, MD  furosemide (LASIX) 40 MG tablet Take 40 mg by mouth every other day. Pt alternates with the 80mg  tablet.   Yes Historical Provider, MD  furosemide (LASIX) 80 MG tablet Take 80 mg by mouth every other day. Pt alternates with the 40mg  tablet.   Yes Historical Provider, MD  glimepiride (AMARYL) 4 MG tablet Take 4 mg by mouth 2 (two) times daily  after a meal.   Yes Historical Provider, MD  magnesium oxide (MAG-OX) 400 MG tablet Take 400 mg by mouth daily.   Yes Historical Provider, MD  Melatonin 1 MG TABS Take 1 mg by mouth at bedtime.   Yes Historical Provider, MD  metolazone (ZAROXOLYN) 2.5 MG tablet Take 2.5 mg by mouth 2 (two) times a week. Pt takes on Monday and Friday.   Yes Historical Provider, MD  oxyCODONE (OXY IR/ROXICODONE) 5 MG immediate release tablet Take 5-10 mg by mouth every 4 (four) hours as needed for severe pain.    Yes Historical Provider, MD  pantoprazole (PROTONIX) 40 MG tablet Take 40 mg by mouth daily.   Yes Historical Provider, MD  potassium chloride (KLOR-CON) 20 MEQ packet Take by mouth 2 (two) times daily.   Yes Historical Provider, MD  rOPINIRole (REQUIP) 0.5 MG  tablet Take 0.5 mg by mouth 2 (two) times daily.   Yes Historical Provider, MD  sitaGLIPtin (JANUVIA) 100 MG tablet Take 100 mg by mouth daily.   Yes Historical Provider, MD  spironolactone (ALDACTONE) 25 MG tablet Take 25 mg by mouth daily.    Yes Historical Provider, MD  vitamin B-12 (CYANOCOBALAMIN) 1000 MCG tablet Take 1,000 mcg by mouth daily.   Yes Historical Provider, MD  vitamin C (ASCORBIC ACID) 500 MG tablet Take 500 mg by mouth daily.   Yes Historical Provider, MD  Vitamin D, Ergocalciferol, (DRISDOL) 50000 UNITS CAPS capsule Take 50,000 Units by mouth every 7 (seven) days. Pt takes on Thursday.   Yes Historical Provider, MD  budesonide-formoterol (SYMBICORT) 160-4.5 MCG/ACT inhaler Inhale 2 puffs into the lungs 2 (two) times daily. 09/03/15   Gladstone Lighter, MD  doxycycline (VIBRAMYCIN) 50 MG capsule Take 1 capsule (50 mg total) by mouth 2 (two) times daily. 10/17/15   Lorin Picket, PA-C  escitalopram (LEXAPRO) 10 MG tablet Take 10 mg by mouth daily.    Historical Provider, MD  losartan (COZAAR) 25 MG tablet Take 25 mg by mouth daily.    Historical Provider, MD  mupirocin ointment (BACTROBAN) 2 % Apply 1 application topically 3 (three) times daily. 10/17/15   Lorin Picket, PA-C  predniSONE (STERAPRED UNI-PAK 21 TAB) 10 MG (21) TBPK tablet Take 1 tablet (10 mg total) by mouth daily. Start 60 mg po daily, taper 10 mg daily until done 09/13/15   Max Sane, MD   Meds Ordered and Administered this Visit  Medications - No data to display  BP 133/58 mmHg  Pulse 74  Temp(Src) 98.2 F (36.8 C) (Oral)  Resp 18  Ht 5\' 7"  (1.702 m)  Wt 158 lb (71.668 kg)  BMI 24.74 kg/m2  SpO2 97% No data found.   Physical Exam  Constitutional: She is oriented to person, place, and time. She appears well-developed and well-nourished.  HENT:  Head: Normocephalic and atraumatic.  Eyes: Conjunctivae are normal. Pupils are equal, round, and reactive to light.  Neck: Normal range of motion. Neck  supple.  Musculoskeletal: Normal range of motion. She exhibits edema and tenderness.  Examination of the left leg shows a 4-1/2 cm laceration of the dermis into the subcutaneous tissue. It is of the configuration equal legs on either side. There is no obvious contamination.  Neurological: She is alert and oriented to person, place, and time.  Skin: Skin is warm and dry.  Psychiatric: She has a normal mood and affect. Her behavior is normal. Judgment and thought content normal.  Nursing note and vitals reviewed.  ED Course  .Marland KitchenLaceration Repair Date/Time: 10/17/2015 7:26 PM Performed by: Lorin Picket Authorized by: Frederich Cha Consent: Verbal consent obtained. Risks and benefits: risks, benefits and alternatives were discussed Consent given by: patient Patient understanding: patient states understanding of the procedure being performed Patient identity confirmed: verbally with patient, arm band, provided demographic data and hospital-assigned identification number Body area: lower extremity Location details: left lower leg Laceration length: 4.5 cm Foreign bodies: no foreign bodies Tendon involvement: none Nerve involvement: none Vascular damage: no Anesthesia: local infiltration Local anesthetic: lidocaine 1% without epinephrine Anesthetic total: 4.5 ml Patient sedated: no Preparation: Patient was prepped and draped in the usual sterile fashion. Irrigation solution: saline Irrigation method: jet lavage Amount of cleaning: extensive Debridement: none Degree of undermining: none Skin closure: 4-0 nylon Number of sutures: 8 Technique: simple Approximation: loose Approximation difficulty: simple Dressing: 4x4 sterile gauze, antibiotic ointment, non-adhesive packing strip and tube gauze Patient tolerance: Patient tolerated the procedure well with no immediate complications Comments: The patient return in 2 days for wound check and plan on leaving the sutures in for 10 -14  days. Keep dry for 24 hours and may start washing program. I have asked her to apply Bactroban ointment 3 times a day until the sutures are removed. Causing her very thin skin she would not tolerate any tape and I have given her surgeon and attending the dressings in place. Signs and symptoms were explained to the patient and they will return sooner if any of these occur. A tetanus toxoid was not given due to the fact that she had a laceration last year taking care of the emergency department and they think that she had her tetanus there   (including critical care time)  Labs Review Labs Reviewed - No data to display  Imaging Review No results found.   Visual Acuity Review  Right Eye Distance:   Left Eye Distance:   Bilateral Distance:    Right Eye Near:   Left Eye Near:    Bilateral Near:         MDM   1. Laceration of left leg, initial encounter    New Prescriptions   DOXYCYCLINE (VIBRAMYCIN) 50 MG CAPSULE    Take 1 capsule (50 mg total) by mouth 2 (two) times daily.   MUPIROCIN OINTMENT (BACTROBAN) 2 %    Apply 1 application topically 3 (three) times daily.  Plan: 1. Test/x-ray results and diagnosis reviewed with patient 2. rx as per orders; risks, benefits, potential side effects reviewed with patient 3. Recommend supportive treatment with Keeping dry for 24 hours. Her washing program thereafterwards 3 times a day applying Bactroban ointment after thoroughly drying the area. We will see her in 2 days for a wound check due to her diabetes and her age and plan on leaving the sutures in place for 10-14 days 4. F/u prn if symptoms worsen or don't improve     Lorin Picket, PA-C 10/17/15 1932

## 2015-10-17 NOTE — Discharge Instructions (Signed)
Laceration Care, Adult A laceration is a cut that goes through all of the layers of the skin and into the tissue that is right under the skin. Some lacerations heal on their own. Others need to be closed with stitches (sutures), staples, skin adhesive strips, or skin glue. Proper laceration care minimizes the risk of infection and helps the laceration to heal better. HOW TO CARE FOR YOUR LACERATION If sutures or staples were used:  Keep the wound clean and dry.  If you were given a bandage (dressing), you should change it at least one time per day or as told by your health care provider. You should also change it if it becomes wet or dirty.  Keep the wound completely dry for the first 24 hours or as told by your health care provider. After that time, you may shower or bathe. However, make sure that the wound is not soaked in water until after the sutures or staples have been removed.  Clean the wound one time each day or as told by your health care provider:  Wash the wound with soap and water.  Rinse the wound with water to remove all soap.  Pat the wound dry with a clean towel. Do not rub the wound.  After cleaning the wound, apply a thin layer of antibiotic ointmentas told by your health care provider. This will help to prevent infection and keep the dressing from sticking to the wound.  Have the sutures or staples removed as told by your health care provider. If skin adhesive strips were used:  Keep the wound clean and dry.  If you were given a bandage (dressing), you should change it at least one time per day or as told by your health care provider. You should also change it if it becomes dirty or wet.  Do not get the skin adhesive strips wet. You may shower or bathe, but be careful to keep the wound dry.  If the wound gets wet, pat it dry with a clean towel. Do not rub the wound.  Skin adhesive strips fall off on their own. You may trim the strips as the wound heals. Do not  remove skin adhesive strips that are still stuck to the wound. They will fall off in time. If skin glue was used:  Try to keep the wound dry, but you may briefly wet it in the shower or bath. Do not soak the wound in water, such as by swimming.  After you have showered or bathed, gently pat the wound dry with a clean towel. Do not rub the wound.  Do not do any activities that will make you sweat heavily until the skin glue has fallen off on its own.  Do not apply liquid, cream, or ointment medicine to the wound while the skin glue is in place. Using those may loosen the film before the wound has healed.  If you were given a bandage (dressing), you should change it at least one time per day or as told by your health care provider. You should also change it if it becomes dirty or wet.  If a dressing is placed over the wound, be careful not to apply tape directly over the skin glue. Doing that may cause the glue to be pulled off before the wound has healed.  Do not pick at the glue. The skin glue usually remains in place for 5-10 days, then it falls off of the skin. General Instructions  Take over-the-counter and prescription  medicines only as told by your health care provider. °· If you were prescribed an antibiotic medicine or ointment, take or apply it as told by your doctor. Do not stop using it even if your condition improves. °· To help prevent scarring, make sure to cover your wound with sunscreen whenever you are outside after stitches are removed, after adhesive strips are removed, or when glue remains in place and the wound is healed. Make sure to wear a sunscreen of at least 30 SPF. °· Do not scratch or pick at the wound. °· Keep all follow-up visits as told by your health care provider. This is important. °· Check your wound every day for signs of infection. Watch for: °· Redness, swelling, or pain. °· Fluid, blood, or pus. °· Raise (elevate) the injured area above the level of your heart  while you are sitting or lying down, if possible. °SEEK MEDICAL CARE IF: °· You received a tetanus shot and you have swelling, severe pain, redness, or bleeding at the injection site. °· You have a fever. °· A wound that was closed breaks open. °· You notice a bad smell coming from your wound or your dressing. °· You notice something coming out of the wound, such as wood or glass. °· Your pain is not controlled with medicine. °· You have increased redness, swelling, or pain at the site of your wound. °· You have fluid, blood, or pus coming from your wound. °· You notice a change in the color of your skin near your wound. °· You need to change the dressing frequently due to fluid, blood, or pus draining from the wound. °· You develop a new rash. °· You develop numbness around the wound. °SEEK IMMEDIATE MEDICAL CARE IF: °· You develop severe swelling around the wound. °· Your pain suddenly increases and is severe. °· You develop painful lumps near the wound or on skin that is anywhere on your body. °· You have a red streak going away from your wound. °· The wound is on your hand or foot and you cannot properly move a finger or toe. °· The wound is on your hand or foot and you notice that your fingers or toes look pale or bluish. °  °This information is not intended to replace advice given to you by your health care provider. Make sure you discuss any questions you have with your health care provider. °  °Document Released: 06/10/2005 Document Revised: 10/25/2014 Document Reviewed: 06/06/2014 °Elsevier Interactive Patient Education ©2016 [employer] ° °Stitches, Staples, or Adhesive Wound Closure °Health care providers use stitches (sutures), staples, and certain glue (skin adhesives) to hold skin together while it heals (wound closure). You may need this treatment after you have surgery or if you cut your skin accidentally. These methods help your skin to heal more quickly and make it less likely that you will have  a scar. A wound may take several months to heal completely. °The type of wound you have determines when your wound gets closed. In most cases, the wound is closed as soon as possible (primary skin closure). Sometimes, closure is delayed so the wound can be cleaned and allowed to heal naturally. This reduces the chance of infection. Delayed closure may be needed if your wound: °· Is caused by a bite. °· Happened more than 6 hours ago. °· Involves loss of skin or the tissues under the skin. °· Has dirt or debris in it that cannot be removed. °· Is infected. °WHAT   ARE THE DIFFERENT KINDS OF WOUND CLOSURES? °There are many options for wound closure. The one that your health care provider uses depends on how deep and how large your wound is. °Adhesive Glue °To use this type of glue to close a wound, your health care provider holds the edges of the wound together and paints the glue on the surface of your skin. You may need more than one layer of glue. Then the wound may be covered with a light bandage (dressing). °This type of skin closure may be used for small wounds that are not deep (superficial). Using glue for wound closure is less painful than other methods. It does not require a medicine that numbs the area (local anesthetic). This method also leaves nothing to be removed. Adhesive glue is often used for children and on facial wounds. °Adhesive glue cannot be used for wounds that are deep, uneven, or bleeding. It is not used inside of a wound.  °Adhesive Strips °These strips are made of sticky (adhesive), porous paper. They are applied across your skin edges like a regular adhesive bandage. You leave them on until they fall off. °Adhesive strips may be used to close very superficial wounds. They may also be used along with sutures to improve the closure of your skin edges.  °Sutures °Sutures are the oldest method of wound closure. Sutures can be made from natural substances, such as silk, or from synthetic  materials, such as nylon and steel. They can be made from a material that your body can break down as your wound heals (absorbable), or they can be made from a material that needs to be removed from your skin (nonabsorbable). They come in many different strengths and sizes. °Your health care provider attaches the sutures to a steel needle on one end. Sutures can be passed through your skin, or through the tissues beneath your skin. Then they are tied and cut. Your skin edges may be closed in one continuous stitch or in separate stitches. °Sutures are strong and can be used for all kinds of wounds. Absorbable sutures may be used to close tissues under the skin. The disadvantage of sutures is that they may cause skin reactions that lead to infection. Nonabsorbable sutures need to be removed. °Staples °When surgical staples are used to close a wound, the edges of your skin on both sides of the wound are brought close together. A staple is placed across the wound, and an instrument secures the edges together. Staples are often used to close surgical cuts (incisions). °Staples are faster to use than sutures, and they cause less skin reaction. Staples need to be removed using a tool that bends the staples away from your skin. °HOW DO I CARE FOR MY WOUND CLOSURE? °· Take medicines only as directed by your health care provider. °· If you were prescribed an antibiotic medicine for your wound, finish it all even if you start to feel better. °· Use ointments or creams only as directed by your health care provider. °· Wash your hands with soap and water before and after touching your wound. °· Do not soak your wound in water. Do not take baths, swim, or use a hot tub until your health care provider approves. °· Ask your health care provider when you can start showering. Cover your wound if directed by your health care provider. °· Do not take out your own sutures or staples. °· Do not pick at your wound. Picking can cause an  infection. °·   Keep all follow-up visits as directed by your health care provider. This is important. HOW LONG WILL I HAVE MY WOUND CLOSURE?  Leave adhesive glue on your skin until the glue peels away.  Leave adhesive strips on your skin until the strips fall off.  Absorbable sutures will dissolve within several days.  Nonabsorbable sutures and staples must be removed. The location of the wound will determine how long they stay in. This can range from several days to a couple of weeks. WHEN SHOULD I SEEK HELP FOR MY WOUND CLOSURE? Contact your health care provider if:  You have a fever.  You have chills.  You have drainage, redness, swelling, or pain at your wound.  There is a bad smell coming from your wound.  The skin edges of your wound start to separate after your sutures have been removed.  Your wound becomes thick, raised, and darker in color after your sutures come out (scarring).   This information is not intended to replace advice given to you by your health care provider. Make sure you discuss any questions you have with your health care provider.   Document Released: 03/05/2001 Document Revised: 07/01/2014 Document Reviewed: 11/17/2013 Elsevier Interactive Patient Education 2016 [place] Taking care of your wound properly can help to prevent pain and infection. It can also help your wound to heal more quickly.  HOW TO CARE FOR YOUR WOUND  Take or apply over-the-counter and prescription medicines only as told by your health care provider.  If you were prescribed antibiotic medicine, take or apply it as told by your health care provider. Do not stop using the antibiotic even if your condition improves.  Clean the wound each day or as told by your health care provider.  Wash the wound with mild soap and water.  Rinse the wound with water to remove all soap.  Pat the wound dry with a clean towel. Do not rub it.  There are many different ways to  close and cover a wound. For example, a wound can be covered with stitches (sutures), skin glue, or adhesive strips. Follow instructions from your health care provider about:  How to take care of your wound.  When and how you should change your bandage (dressing).  When you should remove your dressing.  Removing whatever was used to close your wound.  Check your wound every day for signs of infection. Watch for:  Redness, swelling, or pain.  Fluid, blood, or pus.  Keep the dressing dry until your health care provider says it can be removed. Do not take baths, swim, use a hot tub, or do anything that would put your wound underwater until your health care provider approves.  Raise (elevate) the injured area above the level of your heart while you are sitting or lying down.  Do not scratch or pick at the wound.  Keep all follow-up visits as told by your health care provider. This is important. SEEK MEDICAL CARE IF:  You received a tetanus shot and you have swelling, severe pain, redness, or bleeding at the injection site.  You have a fever.  Your pain is not controlled with medicine.  You have increased redness, swelling, or pain at the site of your wound.  You have fluid, blood, or pus coming from your wound.  You notice a bad smell coming from your wound or your dressing. SEEK IMMEDIATE MEDICAL CARE IF:  You have a red streak going away from your wound.  This information is not intended to replace advice given to you by your health care provider. Make sure you discuss any questions you have with your health care provider.   Document Released: 03/19/2008 Document Revised: 10/25/2014 Document Reviewed: 06/06/2014 Elsevier Interactive Patient Education [employer].

## 2015-10-30 ENCOUNTER — Telehealth: Payer: Self-pay | Admitting: Respiratory Therapy

## 2015-10-30 NOTE — Telephone Encounter (Signed)
Called [name]. She last attended 08/23/2015. She has been out due to pneumonia and rehab and is currently doing home physical therapy. [name] would like to return to [place].

## 2015-11-07 ENCOUNTER — Encounter: Payer: Self-pay | Admitting: Respiratory Therapy

## 2015-11-07 DIAGNOSIS — I509 Heart failure, unspecified: Secondary | ICD-10-CM

## 2015-11-07 NOTE — Progress Notes (Signed)
Pulmonary Individual Treatment Plan  Patient Details  [name] MRN: [id number] Date of Birth: Feb 19, 1925 Referring Provider: Dr Wendie Chess   Initial Encounter Date:       Pulmonary Rehab from 05/23/2015 in [facility]   Date  05/23/15      Visit Diagnosis: Acute on chronic congestive heart failure, unspecified congestive heart failure type ([place])  Patient's Home Medications on Admission:  Current outpatient prescriptions:    apixaban (ELIQUIS) 5 MG TABS tablet, Take 5 mg by mouth 2 (two) times daily., Disp: , Rfl:    baclofen (LIORESAL) 10 MG tablet, Take 10 mg by mouth 3 (three) times daily., Disp: , Rfl:    budesonide-formoterol (SYMBICORT) 160-4.5 MCG/ACT inhaler, Inhale 2 puffs into the lungs 2 (two) times daily., Disp: 1 Inhaler, Rfl: 12   carvedilol (COREG) 6.25 MG tablet, Take 6.25 mg by mouth 2 (two) times daily with a meal. , Disp: , Rfl:    cholecalciferol (VITAMIN D) 1000 units tablet, Take 1,000 Units by mouth daily., Disp: , Rfl:    docusate sodium (COLACE) 100 MG capsule, Take 100 mg by mouth 2 (two) times daily. , Disp: , Rfl:    doxycycline (VIBRAMYCIN) 50 MG capsule, Take 1 capsule (50 mg total) by mouth 2 (two) times daily., Disp: 10 capsule, Rfl: 0   escitalopram (LEXAPRO) 10 MG tablet, Take 10 mg by mouth daily., Disp: , Rfl:    fentaNYL (DURAGESIC - DOSED MCG/HR) 50 MCG/HR, Place 50 mcg onto the skin every other day. , Disp: , Rfl:    ferrous sulfate 325 (65 FE) MG tablet, Take 325 mg by mouth daily with breakfast., Disp: , Rfl:    furosemide (LASIX) 40 MG tablet, Take 40 mg by mouth every other day. Pt alternates with the '80mg'$  tablet., Disp: , Rfl:    furosemide (LASIX) 80 MG tablet, Take 80 mg by mouth every other day. Pt alternates with the '40mg'$  tablet., Disp: , Rfl:    glimepiride (AMARYL) 4 MG tablet, Take 4 mg by mouth 2 (two) times daily after a meal., Disp: , Rfl:    losartan (COZAAR) 25  MG tablet, Take 25 mg by mouth daily., Disp: , Rfl:    magnesium oxide (MAG-OX) 400 MG tablet, Take 400 mg by mouth daily., Disp: , Rfl:    Melatonin 1 MG TABS, Take 1 mg by mouth at bedtime., Disp: , Rfl:    metolazone (ZAROXOLYN) 2.5 MG tablet, Take 2.5 mg by mouth 2 (two) times a week. Pt takes on Monday and Friday., Disp: , Rfl:    mupirocin ointment (BACTROBAN) 2 %, Apply 1 application topically 3 (three) times daily., Disp: 22 g, Rfl: 0   oxyCODONE (OXY IR/ROXICODONE) 5 MG immediate release tablet, Take 5-10 mg by mouth every 4 (four) hours as needed for severe pain. , Disp: , Rfl:    pantoprazole (PROTONIX) 40 MG tablet, Take 40 mg by mouth daily., Disp: , Rfl:    potassium chloride (KLOR-CON) 20 MEQ packet, Take by mouth 2 (two) times daily., Disp: , Rfl:    predniSONE (STERAPRED UNI-PAK 21 TAB) 10 MG (21) TBPK tablet, Take 1 tablet (10 mg total) by mouth daily. Start 60 mg po daily, taper 10 mg daily until done, Disp: 21 tablet, Rfl: 0   rOPINIRole (REQUIP) 0.5 MG tablet, Take 0.5 mg by mouth 2 (two) times daily., Disp: , Rfl:    sitaGLIPtin (JANUVIA) 100 MG tablet, Take 100 mg by mouth daily.,  Disp: , Rfl:    spironolactone (ALDACTONE) 25 MG tablet, Take 25 mg by mouth daily. , Disp: , Rfl:    vitamin B-12 (CYANOCOBALAMIN) 1000 MCG tablet, Take 1,000 mcg by mouth daily., Disp: , Rfl:    vitamin C (ASCORBIC ACID) 500 MG tablet, Take 500 mg by mouth daily., Disp: , Rfl:    Vitamin D, Ergocalciferol, (DRISDOL) 50000 UNITS CAPS capsule, Take 50,000 Units by mouth every 7 (seven) days. Pt takes on Thursday., Disp: , Rfl:   Past Medical History: Past Medical History  Diagnosis Date   CHF (congestive heart failure) ([place])    Diabetes mellitus without complication ([place])    Hypertension    Chronic back pain    Cancer ([place])     skin ca    Tobacco Use: History  Smoking status   Never Smoker   Smokeless tobacco   Never Used    Labs: Recent Review Flowsheet Data     Labs for ITP Cardiac and Pulmonary Rehab Latest Ref Rng 03/04/2012 03/20/2015 09/06/2015   Cholestrol 0-200 mg/dL 101 - -   LDLCALC 0-100 mg/dL 60 - -   HDL 40-60 mg/dL 23(L) - -   Trlycerides 0-200 mg/dL 90 - -   Hemoglobin A1c 4.0 - 6.0 % 7.7(H) 7.0(H) -   HCO3 21.0 - 28.0 mEq/L - - 34.4(H)         POCT Glucose      05/29/15 1000 05/31/15 1000 07/26/15 1432       POCT Blood Glucose   Pre-Exercise 206 mg/dL  Muffin, grits, and coffee; Fasting Glucose 144 201 mg/dL      Post-Exercise 189 mg/dL 170 mg/dL      Pre-Exercise #3   223 mg/dL     Post-Exercise #3   158 mg/dL        ADL UCSD:     Pulmonary Assessment Scores      05/23/15 1100       ADL UCSD   ADL Phase Entry     SOB Score total 14     Rest 0     Walk 0     Stairs 1     Bath 1     Dress 1     Shop 2        Pulmonary Function Assessment:     Pulmonary Function Assessment - 05/23/15 1100    Initial Spirometry Results   FVC% 47 %   FEV1% 20 %   FEV1/FVC Ratio 100   Breath   Bilateral Breath Sounds Clear;Decreased      Exercise Target Goals:    Exercise Program Goal: Individual exercise prescription set with THRR, safety & activity barriers. Participant demonstrates ability to understand and report RPE using BORG scale, to self-measure pulse accurately, and to acknowledge the importance of the exercise prescription.  Exercise Prescription Goal: Starting with aerobic activity 30 plus minutes a day, 3 days per week for initial exercise prescription. Provide home exercise prescription and guidelines that participant acknowledges understanding prior to discharge.  Activity Barriers & Risk Stratification:     Activity Barriers & Cardiac Risk Stratification - 05/23/15 1100    Activity Barriers & Cardiac Risk Stratification   Activity Barriers Assistive Device;Deconditioning;Muscular Weakness   Cardiac Risk Stratification Moderate      6 Minute Walk:     6 Minute Walk      05/23/15 1449        6 Minute Walk   Phase Initial  Distance 575 feet     Walk Time 6 minutes     RPE 11     Perceived Dyspnea  2     Symptoms No     Resting HR 74 bpm     Resting BP 118/72 mmHg     Max Ex. HR 77 bpm     Max Ex. BP 124/72 mmHg        Initial Exercise Prescription:     Initial Exercise Prescription - 05/23/15 1400    Date of Initial Exercise RX and Referring Provider   Date 05/23/15   Treadmill   MPH 1.5   Grade 0   Minutes 10   Recumbant Bike   Level 2   RPM 40   Watts 20   Minutes 10   NuStep   Level 2   Watts 40   Minutes 10   Arm Ergometer   Level 1   Watts 10   Minutes 10   Recumbant Elliptical   Level 1   RPM 40   Watts 20   Minutes 10   REL-XR   Level 2   Watts 40   Minutes 10   T5 Nustep   Level 1   Watts 15   Minutes 10   Biostep-RELP   Level 2   Watts 40   Minutes 10   Prescription Details   Frequency (times per week) 3   Duration Progress to 30 minutes of continuous aerobic without signs/symptoms of physical distress   Intensity   THRR REST +  30   Ratings of Perceived Exertion 11-15   Perceived Dyspnea 2-4   Progression   Progression Continue progressive overload as per policy without signs/symptoms or physical distress.   Resistance Training   Training Prescription Yes   Weight 2   Reps 10-15      Perform Capillary Blood Glucose checks as needed.  Exercise Prescription Changes:     Exercise Prescription Changes      05/29/15 1000 05/31/15 1000 07/26/15 1000 07/31/15 1000 08/09/15 1300   Exercise Review   Progression  No No Yes Yes   Response to Exercise   Blood Pressure (Admit) 120/68 mmHg    120/70 mmHg   Blood Pressure (Exercise) 124/64 mmHg    118/62 mmHg   Blood Pressure (Exit) 122/64 mmHg    90/62 mmHg   Heart Rate (Admit) 77 bpm    75 bpm   Heart Rate (Exercise) 97 bpm    91 bpm   Heart Rate (Exit) 73 bpm    75 bpm   Oxygen Saturation (Admit) 98 %    94 %   Oxygen Saturation (Exercise) 91 %    92 %   Oxygen  Saturation (Exit) 91 %    90 %   Rating of Perceived Exertion (Exercise) 13    11   Perceived Dyspnea (Exercise) 0    0   Symptoms Omitted Arm Ergometer due to it being painful to back. This is due to chronic pain.  Both knees were sore from fall Some back and knee pain residual from her fall in December Complained of some tiredness today. Completed the exercise with intervals and took rests when she needed them  Chronic pain   Comments Patient's first day of class. The initial exercise prescription was discussed with the patient. Exercise machine usage and safety were also discussed with the patient.  Patient had sore knees from a fall so her exercise prescription was adjusted slightly.  She was able to perform the same workloads, but had to go slower on while stepping on the NS and BS.  Patient was able to resume previous workloads after being out for over a month which was encouraging to her.  Reviewed individualized exercise prescription and made increases per departmental policy. Exercise increases were discussed with the patient and they were able to perform the new work loads without issue (no signs or symptoms).  York Cerise is making small progressions. Her back and knee pain and overall fragility make more aggressive progression not possible and not practical. York Cerise is using time intervals to exercise for longer periods and to build her endurance. She cannot exercise continuously for more than 10-15 minutes and our first goals is to steadily increase her time and build her endurance.    Duration Progress to 30 minutes of continuous aerobic without signs/symptoms of physical distress Progress to 30 minutes of continuous aerobic without signs/symptoms of physical distress Progress to 30 minutes of continuous aerobic without signs/symptoms of physical distress Progress to 30 minutes of continuous aerobic without signs/symptoms of physical distress Progress to 30 minutes of continuous aerobic without signs/symptoms  of physical distress   Intensity Rest + 30 Rest + 30 Rest + 30 Rest + 30 Rest + 30   Progression   Progression Continue progressive overload as per policy without signs/symptoms or physical distress. Continue progressive overload as per policy without signs/symptoms or physical distress. Continue progressive overload as per policy without signs/symptoms or physical distress. Continue progressive overload as per policy without signs/symptoms or physical distress. Continue progressive overload as per policy without signs/symptoms or physical distress.   Resistance Training   Training Prescription (read-only) Yes Yes Yes Yes Yes   Weight (read-only) '1 1 1 1 1   '$ Reps (read-only) 10-15 10-15 10-15 10-15 10-15   NuStep   Level (read-only) '2 2 2 2 2   '$ Watts (read-only) 40 40 '20 20 20   '$ Minutes (read-only) '10 10 10 12 12   '$ Biostep-RELP   Level (read-only) '2 2 2 2 2   '$ Watts (read-only) 40 40 '18 18 20   '$ Minutes (read-only) '10 10 10 10 10     '$ 08/14/15 1000 08/16/15 1000 08/30/15 0600       Exercise Review   Progression  Yes Yes     Response to Exercise   Blood Pressure (Admit)   136/62 mmHg     Blood Pressure (Exercise)   122/62 mmHg     Blood Pressure (Exit)   120/62 mmHg     Heart Rate (Admit)   76 bpm     Heart Rate (Exercise)   78 bpm     Heart Rate (Exit)   72 bpm     Oxygen Saturation (Admit)   91 %     Oxygen Saturation (Exercise)   95 %     Oxygen Saturation (Exit)   90 %     Rating of Perceived Exertion (Exercise)   11     Perceived Dyspnea (Exercise)   0     Symptoms Chronic pain Chronic pain Chronic Pain     Comments Maintaining time increases and doing well on the machines.  York Cerise is showing progress in her stamina and can tolerate longer exercise times on the machines.       Duration Progress to 30 minutes of continuous aerobic without signs/symptoms of physical distress Progress to 30 minutes of continuous aerobic without signs/symptoms of physical distress Progress to 45 minutes  of aerobic exercise without signs/symptoms of physical distress     Intensity Rest + 30 Rest + 30 THRR unchanged     Progression   Progression Continue progressive overload as per policy without signs/symptoms or physical distress. Continue progressive overload as per policy without signs/symptoms or physical distress. Continue progressive overload as per policy without signs/symptoms or physical distress.     Resistance Training   Training Prescription (read-only) Yes Yes      Weight (read-only) 1 1      Reps (read-only) 10-15 10-15      NuStep   Level   2     Watts   40     Minutes   20     Level (read-only) 2 2      Watts (read-only) 20 20      Minutes (read-only) 12 12      Biostep-RELP   Level   2     Watts   20     Minutes   20     Level (read-only) 2 2      Watts (read-only) 20 20      Minutes (read-only) 10 15         Exercise Comments:   Discharge Exercise Prescription (Final Exercise Prescription Changes):     Exercise Prescription Changes - 08/30/15 0600    Exercise Review   Progression Yes   Response to Exercise   Blood Pressure (Admit) 136/62 mmHg   Blood Pressure (Exercise) 122/62 mmHg   Blood Pressure (Exit) 120/62 mmHg   Heart Rate (Admit) 76 bpm   Heart Rate (Exercise) 78 bpm   Heart Rate (Exit) 72 bpm   Oxygen Saturation (Admit) 91 %   Oxygen Saturation (Exercise) 95 %   Oxygen Saturation (Exit) 90 %   Rating of Perceived Exertion (Exercise) 11   Perceived Dyspnea (Exercise) 0   Symptoms Chronic Pain   Duration Progress to 45 minutes of aerobic exercise without signs/symptoms of physical distress   Intensity THRR unchanged   Progression   Progression Continue progressive overload as per policy without signs/symptoms or physical distress.   NuStep   Level 2   Watts 40   Minutes 20   Biostep-RELP   Level 2   Watts 20   Minutes 20       Nutrition:  Target Goals: Understanding of nutrition guidelines, daily intake of sodium '1500mg'$ ,  cholesterol '200mg'$ , calories 30% from fat and 7% or less from saturated fats, daily to have 5 or more servings of fruits and vegetables.  Biometrics:     Pre Biometrics - 05/23/15 1453    Pre Biometrics   Height '5\' 7"'$  (1.702 m)   Weight 165 lb 12.8 oz (75.206 kg)   Waist Circumference 37 inches   Hip Circumference 43 inches   Waist to Hip Ratio 0.86 %   BMI (Calculated) 26       Nutrition Therapy Plan and Nutrition Goals:     Nutrition Therapy & Goals - 05/23/15 1100    Nutrition Therapy   Diet Prefers not to meet with the dietitian      Nutrition Discharge: Rate Your Plate Scores:   Psychosocial: Target Goals: Acknowledge presence or absence of depression, maximize coping skills, provide positive support system. Participant is able to verbalize types and ability to use techniques and skills needed for reducing stress and depression.  Initial Review & Psychosocial Screening:     Initial Psych Review & Screening - 05/23/15 1100  Family Dynamics   Good Support System? Yes   Comments Ms Halle has good support from her husband and states she has no depression.   Barriers   Psychosocial barriers to participate in program There are no identifiable barriers or psychosocial needs.;The patient should benefit from training in stress management and relaxation.   Screening Interventions   Interventions Encouraged to exercise      Quality of Life Scores:     Quality of Life - 05/23/15 1100    Quality of Life Scores   Health/Function Pre 12.2 %   Socioeconomic Pre 20.57 %   Psych/Spiritual Pre 16.29 %   Family Pre 14.4 %   GLOBAL Pre 15.09 %      PHQ-9:     Recent Review Flowsheet Data    Depression screen [facility] 2/9 05/23/2015 04/10/2015   Decreased Interest 1 3   Down, Depressed, Hopeless 2 0   PHQ - 2 Score 3 3   Altered sleeping 3 3   Tired, decreased energy 2 3   Change in appetite 0 0   Feeling bad or failure about yourself  2 3   Trouble concentrating 0  0   Moving slowly or fidgety/restless 1 0   Suicidal thoughts 1 0   PHQ-9 Score 12 12   Difficult doing work/chores Somewhat difficult Somewhat difficult      Psychosocial Evaluation and Intervention:     Psychosocial Evaluation - 05/31/15 1100    Psychosocial Evaluation & Interventions   Interventions Encouraged to exercise with the program and follow exercise prescription;Relaxation education;Stress management education   Comments Counselor met with Ms. Quinones today for initial psychosocial evaluation.  She is an 82 year old who has congestive heart failure and was recommended to attend this program by her Dr.  Champ Mungo. Chestine Spore has a strong support system with a spouse of 66 years and several adult children who check in on her by phone daily.  She also has help that comes in 4 hours/day and is a part of a faith community.  Ms. Bouler  struggles with chronic back pain subsequent to surgery in 2009.  She reports this pain prevents sound sleep for her, even though she is taking medication for pain at bedtime.  Ms. Amero denies a history of depression or anxiety or current symptoms, other than she admits to "worrying a lot."  she is on Lexapro 10 mg. for this and states it helps some.  She reports her stressors are "worrying" about her spouse who has multiple health issues, including back problems, and her 2 year old son who was paralyzed 7 years ago and lives in [place].  Ms. Vora has goals to increase the strength in her legs and be able to "reitre" her walker and get back to her cane for support.  Counselor encouraged her to consistently exercise to accomplish this goal.  Also, she will benefit from the stress management and relaxation psychoeducational components of this program.     Continued Psychosocial Services Needed Yes  Ms. Shands will benefit from consistent exercise, and psychoeducation on relaxation and stress management particularly.  Counselor will follow up with Ms. Chestine Spore on these.        Psychosocial Re-Evaluation:     Psychosocial Re-Evaluation      08/02/15 1101 08/14/15 1057 08/23/15 1000       Psychosocial Re-Evaluation   Interventions Encouraged to attend Cardiac Rehabilitation for the exercise       Comments Dizziness at times with exercise  so Bryanda also said she has chronic back pain that limits her that she wishes she doesn't have but she reports she has learned to live with it.  Counselor met with Ms. Alford for follow up evaluation.  She reports some increased strength since beginning this class.  She continues to worry a great deal about her spouse's health which has decilined.  She continues to have good support with help that comes in 3 times each week.  Her daughter from [place] will be coming this Wednesday to  support her spouse due to his medical procedures scheduled for this Friday.  Ms. Stief states her grandson is getting married April 1st and she is hoping she and spouse will be well enough to attend.  Counselor will continue to follow with Ms. Ahlgrim in the future.   Ms Kuehnel is very stressed with her husbands health. His injections in his back have not helped, and he now has a blocked caroid artery. I will speak with our mental health councilor to check with Ms Dewalt. Through this all, she still attends [facility] regularly, and it seems to help her emotionally.       Education: Education Goals: Education classes will be provided on a weekly basis, covering required topics. Participant will state understanding/return demonstration of topics presented.  Learning Barriers/Preferences:     Learning Barriers/Preferences - 05/23/15 1100    Learning Barriers/Preferences   Learning Barriers None   Learning Preferences Group Instruction;Individual Instruction;Pictoral;Skilled Demonstration;Verbal Instruction;Video;Written Material      Education Topics: Initial Evaluation Education: - Verbal, written and demonstration of respiratory meds, RPE/PD  scales, oximetry and breathing techniques. Instruction on use of nebulizers and MDIs: cleaning and proper use, rinsing mouth with steroid doses and importance of monitoring MDI activations.          Pulmonary Rehab from 08/16/2015 in [facility] Cardiac and Pulmonary Rehab   Date  05/23/15   Educator  LB   Instruction Review Code  2- meets goals/outcomes      General Nutrition Guidelines/Fats and Fiber: -Group instruction provided by verbal, written material, models and posters to present the general guidelines for heart healthy nutrition. Gives an explanation and review of dietary fats and fiber.      Pulmonary Rehab from 08/16/2015 in [facility] Cardiac and Pulmonary Rehab   Date  07/31/15 Marcelina Morel attended 05/29/15]   Educator  CR   Instruction Review Code  2- meets goals/outcomes      Controlling Sodium/Reading Food Labels: -Group verbal and written material supporting the discussion of sodium use in heart healthy nutrition. Review and explanation with models, verbal and written materials for utilization of the food label.   Exercise Physiology & Risk Factors: - Group verbal and written instruction with models to review the exercise physiology of the cardiovascular system and associated critical values. Details cardiovascular disease risk factors and the goals associated with each risk factor.      Pulmonary Rehab from 08/16/2015 in [facility] Cardiac and Pulmonary Rehab   Date  05/31/15   Educator  SW   Instruction Review Code  2- meets goals/outcomes      Aerobic Exercise & Resistance Training: - Gives group verbal and written discussion on the health impact of inactivity. On the components of aerobic and resistive training programs and the benefits of this training and how to safely progress through these programs.   Flexibility, Balance, General Exercise Guidelines: - Provides group verbal and written instruction on the benefits of flexibility and balance training programs.  Provides general  exercise guidelines with specific guidelines to those with heart or lung disease. Demonstration and skill practice provided.   Stress Management: - Provides group verbal and written instruction about the health risks of elevated stress, cause of high stress, and healthy ways to reduce stress.      Pulmonary Rehab from 08/16/2015 in [facility] Cardiac and Pulmonary Rehab   Date  08/16/15   Educator  [facility]   Instruction Review Code  2- meets goals/outcomes      Depression: - Provides group verbal and written instruction on the correlation between heart/lung disease and depressed mood, treatment options, and the stigmas associated with seeking treatment.   Exercise & Equipment Safety: - Individual verbal instruction and demonstration of equipment use and safety with use of the equipment.      Pulmonary Rehab from 08/16/2015 in [facility] Cardiac and Pulmonary Rehab   Date  05/29/15   Educator  LB   Instruction Review Code  2- meets goals/outcomes      Infection Prevention: - Provides verbal and written material to individual with discussion of infection control including proper hand washing and proper equipment cleaning during exercise session.      Pulmonary Rehab from 08/16/2015 in [facility] Cardiac and Pulmonary Rehab   Date  05/29/15   Educator  LB   Instruction Review Code  2- meets goals/outcomes      Falls Prevention: - Provides verbal and written material to individual with discussion of falls prevention and safety.      Pulmonary Rehab from 08/16/2015 in [facility] Cardiac and Pulmonary Rehab   Date  05/23/15   Educator  LB   Instruction Review Code  2- meets goals/outcomes      Diabetes: - Individual verbal and written instruction to review signs/symptoms of diabetes, desired ranges of glucose level fasting, after meals and with exercise. Advice that pre and post exercise glucose checks will be done for 3 sessions at entry of program.   Chronic Lung Diseases: - Group verbal and written  instruction to review new updates, new respiratory medications, new advancements in procedures and treatments. Provide informative websites and "800" numbers of self-education.   Lung Procedures: - Group verbal and written instruction to describe testing methods done to diagnose lung disease. Review the outcome of test results. Describe the treatment choices: Pulmonary Function Tests, ABGs and oximetry.   Energy Conservation: - Provide group verbal and written instruction for methods to conserve energy, plan and organize activities. Instruct on pacing techniques, use of adaptive equipment and posture/positioning to relieve shortness of breath.   Triggers: - Group verbal and written instruction to review types of environmental controls: home humidity, furnaces, filters, dust mite/pet prevention, HEPA vacuums. To discuss weather changes, air quality and the benefits of nasal washing.   Exacerbations: - Group verbal and written instruction to provide: warning signs, infection symptoms, calling MD promptly, preventive modes, and value of vaccinations. Review: effective airway clearance, coughing and/or vibration techniques. Create an Sport and exercise psychologist.   Oxygen: - Individual and group verbal and written instruction on oxygen therapy. Includes supplement oxygen, available portable oxygen systems, continuous and intermittent flow rates, oxygen safety, concentrators, and Medicare reimbursement for oxygen.   Respiratory Medications: - Group verbal and written instruction to review medications for lung disease. Drug class, frequency, complications, importance of spacers, rinsing mouth after steroid MDI's, and proper cleaning methods for nebulizers.   AED/CPR: - Group verbal and written instruction with the use of models to demonstrate the basic use of the  AED with the basic ABC's of resuscitation.   Breathing Retraining: - Provides individuals verbal and written instruction on purpose, frequency,  and proper technique of diaphragmatic breathing and pursed-lipped breathing. Applies individual practice skills.      Pulmonary Rehab from 08/16/2015 in [facility]   Date  05/23/15   Educator  LB   Instruction Review Code  2- meets goals/outcomes      Anatomy and Physiology of the Lungs: - Group verbal and written instruction with the use of models to provide basic lung anatomy and physiology related to function, structure and complications of lung disease.   Heart Failure: - Group verbal and written instruction on the basics of heart failure: signs/symptoms, treatments, explanation of ejection fraction, enlarged heart and cardiomyopathy.   Sleep Apnea: - Individual verbal and written instruction to review Obstructive Sleep Apnea. Review of risk factors, methods for diagnosing and types of masks and machines for OSA.   Anxiety: - Provides group, verbal and written instruction on the correlation between heart/lung disease and anxiety, treatment options, and management of anxiety.   Relaxation: - Provides group, verbal and written instruction about the benefits of relaxation for patients with heart/lung disease. Also provides patients with examples of relaxation techniques.   Knowledge Questionnaire Score:    Core Components/Risk Factors/Patient Goals at Admission:     Personal Goals and Risk Factors at Admission - 05/23/15 1100    Core Components/Risk Factors/Patient Goals on Admission   Sedentary Yes   Intervention (read-only) While in program, learn and follow the exercise prescription taught. Start at a low level workload and increase workload after able to maintain previous level for 30 minutes. Increase time before increasing intensity.  Ms Mohon's goal is to increase her stamina for walking. She has a co-pay and therefore we will work her into FF with her [employer].   Improve shortness of breath with ADL's No   Develop more efficient  breathing techniques such as purse lipped breathing and diaphragmatic breathing; and practicing self-pacing with activity Yes   Intervention (read-only) While in program, learn and utilize the specific breathing techniques taught to you. Continue to practice and use the techniques as needed.  Instructed Ms Kemmerer on PLB. When she was in Physical Therapy, she learned this technique .   Diabetes Yes   Goal Blood glucose control identified by blood glucose values, HgbA1C. Participant verbalizes understanding of the signs/symptoms of hyper/hypo glycemia, proper foot care and importance of medication and nutrition plan for blood glucose control.   Intervention (read-only) Provide nutrition & aerobic exercise along with prescribed medications to achieve blood glucose in normal ranges: Fasting 65-99 mg/dL   Hypertension Yes   Goal Participant will see blood pressure controlled within the values of 140/73m/Hg or within value directed by their physician.   Intervention (read-only) Provide nutrition & aerobic exercise along with prescribed medications to achieve BP 140/90 or less.   Understand more about Heart/Pulmonary Disease. Yes   Intervention While in program utilize professionals for any questions, and attend the education sessions. Great websites to use are www.americanheart.org or www.lung.org for reliable information.  Ms CFlaumhas CHF and would like to learn more about the disease.      Core Components/Risk Factors/Patient Goals Review:      Goals and Risk Factor Review      05/29/15 1000 05/31/15 1000 07/31/15 1038 08/02/15 1000 08/02/15 1056   Core Components/Risk Factors/Patient Goals Review   Personal Goals Review Develop more efficient  breathing techniques such as purse lipped breathing and diaphragmatic breathing and practicing self-pacing with activity. Increase Aerobic Exercise and Physical Activity   Diabetes;Hypertension   Increase Aerobic Exercise and Physical Activity (read-only)    Goals Progress/Improvement seen   Yes Yes  Yes   Comments  Plan to move Ms Starry into [employer] in about a week - she has a copay of $20.00 with each visit; she has done well on the NS and BioStep with acceptable vital signs. He husband may join FF as well. Ms Maultsby has returned to class after an extended absense due to a fall and knee injury. She stated that her knee is tolerating the exercise well. Options for [employer] classes were discussed with Ms Feltman as she has a $20 copay.  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  Explained to Ms Kingdon that there is a waiting list for the 1:15 FF; she will continue in [place] and wait for the opening. Jazaria said she was dizzy again today but after blood pressure was 110/70 with pulse 76 and non fasting fingerstick blood sugar was 192. (Had breakfast this am).Leianne was able to go exercise on the Biostep sitting down after her dizziness passed after 360 cc of water.    Understand more about Heart/Pulmonary Disease (read-only)   Goals Progress/Improvement seen      Yes   Comments     I asked Ayiana if she is on a fluid restriction since some people with Heart failure are. She reported no so given 360 cc water per above after she c/o dizziness. Jobe Gibbon denies vertigo history.    Breathing Techniques (read-only)   Comments Ms Lacina using PLB, more for pain than shortness of breath.       Diabetes (read-only)   Progress seen towards goals     Yes   Comments     Blood sugar after breakfast today when she c./o dizziness was 192. Lou had breakfast this am.    Hypertension (read-only)   Progress seen toward goals     Yes   Comments     Blood pressure today when she c/o dizziness was 110/70     08/02/15 1121 08/09/15 1101 08/23/15 1000       Core Components/Risk Factors/Patient Goals Review   Personal Goals Review Diabetes;Hypertension Improve shortness of breath with ADL's;Develop more  efficient breathing techniques such as purse lipped breathing and diaphragmatic breathing and practicing self-pacing with activity. Sedentary     Review   Ms Dacruz has improved with her exercise to 27mn/ NS and BioStep. She is stronger with her walk in and out and even requested an increase in her Level on the BioStep.     Expected Outcomes   Continue increasing resistance on the NS and BioStep within the next 2 weeks.     Improve shortness of breath with ADL's (read-only)   Goals Progress/Improvement seen   Met      Comments  LYork Cerisestates she does not experience SOB symptoms. She stated that she does tire easily. She does stop when she is tired and will slow down and pace herself to prevent herself from gettin overtired.  She hopes to see an increase in  stamina as she continues with the exercise prescription with the program.      Breathing Techniques (read-only)   Goals Progress/Improvement seen   Yes  Comments  Reviewed the technique for PLB with York Cerise today.  She stared and demonstrated good PLB technique.  Reminded her to use this throughout her exercise and when exerting herself in any activity.      Diabetes (read-only)   Goal Blood glucose control identified by blood glucose values, HgbA1C. Participant verbalizes understanding of the signs/symptoms of hyper/hypo glycemia, proper foot care and importance of medication and nutrition plan for blood glucose control.       Progress seen towards goals Yes       Comments Lou sysates her A1c is around 7.4%.  She has maintained good control with ther blood sugars and is aware of her meds and nutriton/exercise requirements to help maintain good Blood sugar contro.       Hypertension (read-only)   Goal Participant will see blood pressure controlled within the values of 140/72m/Hg or within value directed by their physician.       Progress seen toward goals Yes       Comments BP ranges are in good ranges by chart review.  LYork Cerisetalked about keeping up  with her weight every day and when to call her MD about 3 pounds weight gain. "I take Lasix 80 mg one day and then 40 mg the next"    LYork Ceriseis aware of what she needs to do by keeping up with er meds/nutrition and exercise to help keep her BP in noraml range.           Core Components/Risk Factors/Patient Goals at Discharge (Final Review):      Goals and Risk Factor Review - 08/23/15 1000    Core Components/Risk Factors/Patient Goals Review   Personal Goals Review Sedentary   Review Ms CPhillippihas improved with her exercise to 270m/ NS and BioStep. She is stronger with her walk in and out and even requested an increase in her Level on the BioStep.   Expected Outcomes Continue increasing resistance on the NS and BioStep within the next 2 weeks.      ITP Comments:     ITP Comments      06/20/15 1133 07/26/15 1000 07/26/15 1053 07/31/15 1000 07/31/15 1023   ITP Comments Ms ClFeeserast attended 05/31/2015. She has had some knee problems, but plans to return the first of the year. Ms ClGoodineeturned to LuWm. Wrigley Jr. Companyfter a month of resting her lt knee; she exercised 32 minutes and omitted weights; a good first day back to exercise. Personal and exercise goals expected to be met in 32 more sessions. Progress on specific individualized goals will be charted in patient's ITP. Upon completion of the program the patient will be comfortable managing exercise goals and progression on their own.  Ms ClWeathermaneft early today; she was tired and had episodes of dizziness; glucous level checked 174; Ms ClRamanathans under a lot of stress with family. Personal and exercise goals expected to be met in 31 more sessions. Progress on specific individualized goals will be charted in patient's ITP. Upon completion of the program the patient will be comfortable managing exercise goals and progression on their own.      08/02/15 1102 08/09/15 1100 08/14/15 1035 08/16/15 1058 08/30/15 1614   ITP Comments Kanija said she was dizzy again  today but after blood pressure was 110/70 with pulse 76 and non fasting fingerstick blood sugar was 192. (Had breakfast this am).MaMorganas able to go exercise on the Biostep sitting down after her dizziness passed after 360 cc of water.  Expected to meet program and personal goals by end of program in 29 more sessions. Personal and exercise goals expected to be met in 28 more sessions. Progress on specific individualized goals will be charted in patient's ITP. Upon completion of the program the patient will be comfortable managing exercise goals and progression on their own.  Personal and exercise goals expected to be met in 27 more sessions. Progress on specific individualized goals will be charted in patient's ITP. Upon completion of the program the patient will be comfortable managing exercise goals and progression on their own.  Ms Fesler called and states she fell and hit her head on the bed and would not be able to exercise this week.     11/07/15 0634           ITP Comments Ms Bourdeau has been out of [facility] due to a pneumonia hospitalization and then home physical therapy. She plans to return to [place]. Ms Osbourn last attended 08/23/15.          Comments: 30 Day Progress Note

## 2015-12-01 ENCOUNTER — Other Ambulatory Visit: Payer: Self-pay | Admitting: Internal Medicine

## 2015-12-01 ENCOUNTER — Ambulatory Visit
Admission: RE | Admit: 2015-12-01 | Discharge: 2015-12-01 | Disposition: A | Discharge status: Home / Self Care | Payer: Medicare Other | Source: Ambulatory Visit | Attending: Internal Medicine | Admitting: Internal Medicine

## 2015-12-01 DIAGNOSIS — W19XXXA Unspecified fall, initial encounter: Secondary | ICD-10-CM

## 2015-12-01 DIAGNOSIS — T1490 Injury, unspecified: Secondary | ICD-10-CM | POA: Insufficient documentation

## 2015-12-04 ENCOUNTER — Encounter: Payer: Self-pay | Admitting: *Deleted

## 2015-12-04 DIAGNOSIS — I509 Heart failure, unspecified: Secondary | ICD-10-CM

## 2015-12-04 NOTE — Progress Notes (Signed)
Pulmonary Individual Treatment Plan  Patient Details  [name] MRN: [id number] Date of Birth: 08-May-1925 Referring Provider:    Initial Encounter Date:       Pulmonary Rehab from 05/23/2015 in [place]   Date  05/23/15      Visit Diagnosis: Acute on chronic congestive heart failure, unspecified congestive heart failure type ([place])  Patient's Home Medications on Admission:  Current outpatient prescriptions:  .  apixaban (ELIQUIS) 5 MG TABS tablet, Take 5 mg by mouth 2 (two) times daily., Disp: , Rfl:  .  baclofen (LIORESAL) 10 MG tablet, Take 10 mg by mouth 3 (three) times daily., Disp: , Rfl:  .  budesonide-formoterol (SYMBICORT) 160-4.5 MCG/ACT inhaler, Inhale 2 puffs into the lungs 2 (two) times daily., Disp: 1 Inhaler, Rfl: 12 .  carvedilol (COREG) 6.25 MG tablet, Take 6.25 mg by mouth 2 (two) times daily with a meal. , Disp: , Rfl:  .  cholecalciferol (VITAMIN D) 1000 units tablet, Take 1,000 Units by mouth daily., Disp: , Rfl:  .  docusate sodium (COLACE) 100 MG capsule, Take 100 mg by mouth 2 (two) times daily. , Disp: , Rfl:  .  doxycycline (VIBRAMYCIN) 50 MG capsule, Take 1 capsule (50 mg total) by mouth 2 (two) times daily., Disp: 10 capsule, Rfl: 0 .  escitalopram (LEXAPRO) 10 MG tablet, Take 10 mg by mouth daily., Disp: , Rfl:  .  fentaNYL (DURAGESIC - DOSED MCG/HR) 50 MCG/HR, Place 50 mcg onto the skin every other day. , Disp: , Rfl:  .  ferrous sulfate 325 (65 FE) MG tablet, Take 325 mg by mouth daily with breakfast., Disp: , Rfl:  .  furosemide (LASIX) 40 MG tablet, Take 40 mg by mouth every other day. Pt alternates with the '80mg'$  tablet., Disp: , Rfl:  .  furosemide (LASIX) 80 MG tablet, Take 80 mg by mouth every other day. Pt alternates with the '40mg'$  tablet., Disp: , Rfl:  .  glimepiride (AMARYL) 4 MG tablet, Take 4 mg by mouth 2 (two) times daily after a meal., Disp: , Rfl:  .  losartan (COZAAR) 25 MG tablet, Take  25 mg by mouth daily., Disp: , Rfl:  .  magnesium oxide (MAG-OX) 400 MG tablet, Take 400 mg by mouth daily., Disp: , Rfl:  .  Melatonin 1 MG TABS, Take 1 mg by mouth at bedtime., Disp: , Rfl:  .  metolazone (ZAROXOLYN) 2.5 MG tablet, Take 2.5 mg by mouth 2 (two) times a week. Pt takes on Monday and Friday., Disp: , Rfl:  .  mupirocin ointment (BACTROBAN) 2 %, Apply 1 application topically 3 (three) times daily., Disp: 22 g, Rfl: 0 .  oxyCODONE (OXY IR/ROXICODONE) 5 MG immediate release tablet, Take 5-10 mg by mouth every 4 (four) hours as needed for severe pain. , Disp: , Rfl:  .  pantoprazole (PROTONIX) 40 MG tablet, Take 40 mg by mouth daily., Disp: , Rfl:  .  potassium chloride (KLOR-CON) 20 MEQ packet, Take by mouth 2 (two) times daily., Disp: , Rfl:  .  predniSONE (STERAPRED UNI-PAK 21 TAB) 10 MG (21) TBPK tablet, Take 1 tablet (10 mg total) by mouth daily. Start 60 mg po daily, taper 10 mg daily until done, Disp: 21 tablet, Rfl: 0 .  rOPINIRole (REQUIP) 0.5 MG tablet, Take 0.5 mg by mouth 2 (two) times daily., Disp: , Rfl:  .  sitaGLIPtin (JANUVIA) 100 MG tablet, Take 100 mg by mouth daily., Disp: ,  Rfl:  .  spironolactone (ALDACTONE) 25 MG tablet, Take 25 mg by mouth daily. , Disp: , Rfl:  .  vitamin B-12 (CYANOCOBALAMIN) 1000 MCG tablet, Take 1,000 mcg by mouth daily., Disp: , Rfl:  .  vitamin C (ASCORBIC ACID) 500 MG tablet, Take 500 mg by mouth daily., Disp: , Rfl:  .  Vitamin D, Ergocalciferol, (DRISDOL) 50000 UNITS CAPS capsule, Take 50,000 Units by mouth every 7 (seven) days. Pt takes on Thursday., Disp: , Rfl:   Past Medical History: Past Medical History  Diagnosis Date  . CHF (congestive heart failure) ([place])   . Diabetes mellitus without complication ([place])   . Hypertension   . Chronic back pain   . Cancer ([place])     skin ca    Tobacco Use: History  Smoking status  . Never Smoker   Smokeless tobacco  . Never Used    Labs: Recent Review Flowsheet Data    Labs for ITP  Cardiac and Pulmonary Rehab Latest Ref Rng 03/04/2012 03/20/2015 09/06/2015   Cholestrol 0-200 mg/dL 101 - -   LDLCALC 0-100 mg/dL 60 - -   HDL 40-60 mg/dL 23(L) - -   Trlycerides 0-200 mg/dL 90 - -   Hemoglobin A1c 4.0 - 6.0 % 7.7(H) 7.0(H) -   HCO3 21.0 - 28.0 mEq/L - - 34.4(H)         POCT Glucose      07/26/15 1432           POCT Blood Glucose   Pre-Exercise #3 223 mg/dL       Post-Exercise #3 158 mg/dL          ADL UCSD:   Pulmonary Function Assessment:   Exercise Target Goals:    Exercise Program Goal: Individual exercise prescription set with THRR, safety & activity barriers. Participant demonstrates ability to understand and report RPE using BORG scale, to self-measure pulse accurately, and to acknowledge the importance of the exercise prescription.  Exercise Prescription Goal: Starting with aerobic activity 30 plus minutes a day, 3 days per week for initial exercise prescription. Provide home exercise prescription and guidelines that participant acknowledges understanding prior to discharge.  Activity Barriers & Risk Stratification:   6 Minute Walk:   Initial Exercise Prescription:   Perform Capillary Blood Glucose checks as needed.  Exercise Prescription Changes:     Exercise Prescription Changes      07/26/15 1000 07/31/15 1000 08/09/15 1300 08/14/15 1000 08/16/15 1000   Exercise Review   Progression No Yes Yes  Yes   Response to Exercise   Blood Pressure (Admit)   120/70 mmHg     Blood Pressure (Exercise)   118/62 mmHg     Blood Pressure (Exit)   90/62 mmHg     Heart Rate (Admit)   75 bpm     Heart Rate (Exercise)   91 bpm     Heart Rate (Exit)   75 bpm     Oxygen Saturation (Admit)   94 %     Oxygen Saturation (Exercise)   92 %     Oxygen Saturation (Exit)   90 %     Rating of Perceived Exertion (Exercise)   11     Perceived Dyspnea (Exercise)   0     Symptoms Some back and knee pain residual from her fall in December Complained of some  tiredness today. Completed the exercise with intervals and took rests when she needed them  Chronic pain Chronic pain Chronic pain  Comments Patient was able to resume previous workloads after being out for over a month which was encouraging to her.  Reviewed individualized exercise prescription and made increases per departmental policy. Exercise increases were discussed with the patient and they were able to perform the new work loads without issue (no signs or symptoms).  [name] is making small progressions. Her back and knee pain and overall fragility make more aggressive progression not possible and not practical. [name] is using time intervals to exercise for longer periods and to build her endurance. She cannot exercise continuously for more than 10-15 minutes and our first goals is to steadily increase her time and build her endurance.  Maintaining time increases and doing well on the machines.  [name] is showing progress in her stamina and can tolerate longer exercise times on the machines.    Duration Progress to 30 minutes of continuous aerobic without signs/symptoms of physical distress Progress to 30 minutes of continuous aerobic without signs/symptoms of physical distress Progress to 30 minutes of continuous aerobic without signs/symptoms of physical distress Progress to 30 minutes of continuous aerobic without signs/symptoms of physical distress Progress to 30 minutes of continuous aerobic without signs/symptoms of physical distress   Intensity Rest + 30 Rest + 30 Rest + 30 Rest + 30 Rest + 30   Progression   Progression Continue progressive overload as per policy without signs/symptoms or physical distress. Continue progressive overload as per policy without signs/symptoms or physical distress. Continue progressive overload as per policy without signs/symptoms or physical distress. Continue progressive overload as per policy without signs/symptoms or physical distress. Continue progressive overload as  per policy without signs/symptoms or physical distress.   Resistance Training   Training Prescription (read-only) Yes Yes Yes Yes Yes   Weight (read-only) '1 1 1 1 1   '$ Reps (read-only) 10-15 10-15 10-15 10-15 10-15   NuStep   Level (read-only) '2 2 2 2 2   '$ Watts (read-only) '20 20 20 20 20   '$ Minutes (read-only) '10 12 12 12 12   '$ Biostep-RELP   Level (read-only) '2 2 2 2 2   '$ Watts (read-only) '18 18 20 20 20   '$ Minutes (read-only) '10 10 10 10 15     '$ 08/30/15 0600           Exercise Review   Progression Yes       Response to Exercise   Blood Pressure (Admit) 136/62 mmHg       Blood Pressure (Exercise) 122/62 mmHg       Blood Pressure (Exit) 120/62 mmHg       Heart Rate (Admit) 76 bpm       Heart Rate (Exercise) 78 bpm       Heart Rate (Exit) 72 bpm       Oxygen Saturation (Admit) 91 %       Oxygen Saturation (Exercise) 95 %       Oxygen Saturation (Exit) 90 %       Rating of Perceived Exertion (Exercise) 11       Perceived Dyspnea (Exercise) 0       Symptoms Chronic Pain       Duration Progress to 45 minutes of aerobic exercise without signs/symptoms of physical distress       Intensity THRR unchanged       Progression   Progression Continue progressive overload as per policy without signs/symptoms or physical distress.       NuStep   Level 2  Watts 40       Minutes 20       Biostep-RELP   Level 2       Watts 20       Minutes 20          Exercise Comments:   Discharge Exercise Prescription (Final Exercise Prescription Changes):     Exercise Prescription Changes - 08/30/15 0600    Exercise Review   Progression Yes   Response to Exercise   Blood Pressure (Admit) 136/62 mmHg   Blood Pressure (Exercise) 122/62 mmHg   Blood Pressure (Exit) 120/62 mmHg   Heart Rate (Admit) 76 bpm   Heart Rate (Exercise) 78 bpm   Heart Rate (Exit) 72 bpm   Oxygen Saturation (Admit) 91 %   Oxygen Saturation (Exercise) 95 %   Oxygen Saturation (Exit) 90 %   Rating of Perceived  Exertion (Exercise) 11   Perceived Dyspnea (Exercise) 0   Symptoms Chronic Pain   Duration Progress to 45 minutes of aerobic exercise without signs/symptoms of physical distress   Intensity THRR unchanged   Progression   Progression Continue progressive overload as per policy without signs/symptoms or physical distress.   NuStep   Level 2   Watts 40   Minutes 20   Biostep-RELP   Level 2   Watts 20   Minutes 20       Nutrition:  Target Goals: Understanding of nutrition guidelines, daily intake of sodium '1500mg'$ , cholesterol '200mg'$ , calories 30% from fat and 7% or less from saturated fats, daily to have 5 or more servings of fruits and vegetables.  Biometrics:    Nutrition Therapy Plan and Nutrition Goals:   Nutrition Discharge: Rate Your Plate Scores:   Psychosocial: Target Goals: Acknowledge presence or absence of depression, maximize coping skills, provide positive support system. Participant is able to verbalize types and ability to use techniques and skills needed for reducing stress and depression.  Initial Review & Psychosocial Screening:   Quality of Life Scores:   PHQ-9:     Recent Review Flowsheet Data    Depression screen [facility] 2/9 05/23/2015 04/10/2015   Decreased Interest 1 3   Down, Depressed, Hopeless 2 0   PHQ - 2 Score 3 3   Altered sleeping 3 3   Tired, decreased energy 2 3   Change in appetite 0 0   Feeling bad or failure about yourself  2 3   Trouble concentrating 0 0   Moving slowly or fidgety/restless 1 0   Suicidal thoughts 1 0   PHQ-9 Score 12 12   Difficult doing work/chores Somewhat difficult Somewhat difficult      Psychosocial Evaluation and Intervention:   Psychosocial Re-Evaluation:     Psychosocial Re-Evaluation      08/02/15 1101 08/14/15 1057 08/23/15 1000       Psychosocial Re-Evaluation   Interventions Encouraged to attend Cardiac Rehabilitation for the exercise       Comments Dizziness at times with exercise so  Latorie also said she has chronic back pain that limits her that she wishes she doesn't have but she reports she has learned to live with it.  Counselor met with Sarah. Seguin for follow up evaluation.  She reports some increased strength since beginning this class.  She continues to worry a great deal about her spouse's health which has decilined.  She continues to have good support with help that comes in 3 times each week.  Her daughter from [place] will be coming this  Wednesday to  support her spouse due to his medical procedures scheduled for this Friday.  Sarah. [place] states her grandson is getting married April 1st and she is hoping she and spouse will be well enough to attend.  Counselor will continue to follow with Sarah. Iacovelli in the future.   Sarah Johnstone is very stressed with her husbands health. His injections in his back have not helped, and he now has a blocked caroid artery. I will speak with our mental health councilor to check with Sarah Reddish. Through this all, she still attends [facility] regularly, and it seems to help her emotionally.       Education: Education Goals: Education classes will be provided on a weekly basis, covering required topics. Participant will state understanding/return demonstration of topics presented.  Learning Barriers/Preferences:   Education Topics: Initial Evaluation Education: - Verbal, written and demonstration of respiratory meds, RPE/PD scales, oximetry and breathing techniques. Instruction on use of nebulizers and MDIs: cleaning and proper use, rinsing mouth with steroid doses and importance of monitoring MDI activations.          Pulmonary Rehab from 08/16/[facility] in [facility] Cardiac and Pulmonary Rehab   Date  05/23/15   Educator  LB   Instruction Review Code  2- meets goals/outcomes      General Nutrition Guidelines/Fats and Fiber: -Group instruction provided by verbal, written material, models and posters to present the general guidelines for heart healthy  nutrition. Gives an explanation and review of dietary fats and fiber.      Pulmonary Rehab from 08/16/2015 in [facility] Cardiac and Pulmonary Rehab   Date  07/31/15 Marcelina Morel attended 05/29/15]   Educator  CR   Instruction Review Code  2- meets goals/outcomes      Controlling Sodium/Reading Food Labels: -Group verbal and written material supporting the discussion of sodium use in heart healthy nutrition. Review and explanation with models, verbal and written materials for utilization of the food label.   Exercise Physiology & Risk Factors: - Group verbal and written instruction with models to review the exercise physiology of the cardiovascular system and associated critical values. Details cardiovascular disease risk factors and the goals associated with each risk factor.      Pulmonary Rehab from 08/16/2015 in [facility] Cardiac and Pulmonary Rehab   Date  05/31/15   Educator  SW   Instruction Review Code  2- meets goals/outcomes      Aerobic Exercise & Resistance Training: - Gives group verbal and written discussion on the health impact of inactivity. On the components of aerobic and resistive training programs and the benefits of this training and how to safely progress through these programs.   Flexibility, Balance, General Exercise Guidelines: - Provides group verbal and written instruction on the benefits of flexibility and balance training programs. Provides general exercise guidelines with specific guidelines to those with heart or lung disease. Demonstration and skill practice provided.   Stress Management: - Provides group verbal and written instruction about the health risks of elevated stress, cause of high stress, and healthy ways to reduce stress.      Pulmonary Rehab from 08/16/2015 in [facility] Cardiac and Pulmonary Rehab   Date  08/16/15   Educator  [facility]   Instruction Review Code  2- meets goals/outcomes      Depression: - Provides group verbal and written instruction on the  correlation between heart/lung disease and depressed mood, treatment options, and the stigmas associated with seeking treatment.   Exercise & Equipment Safety: - Individual  verbal instruction and demonstration of equipment use and safety with use of the equipment.      Pulmonary Rehab from 08/16/2015 in [facility] Cardiac and Pulmonary Rehab   Date  05/29/15   Educator  LB   Instruction Review Code  2- meets goals/outcomes      Infection Prevention: - Provides verbal and written material to individual with discussion of infection control including proper hand washing and proper equipment cleaning during exercise session.      Pulmonary Rehab from 08/16/2015 in [facility] Cardiac and Pulmonary Rehab   Date  05/29/15   Educator  LB   Instruction Review Code  2- meets goals/outcomes      Falls Prevention: - Provides verbal and written material to individual with discussion of falls prevention and safety.      Pulmonary Rehab from 08/16/2015 in [facility] Cardiac and Pulmonary Rehab   Date  05/23/15   Educator  LB   Instruction Review Code  2- meets goals/outcomes      Diabetes: - Individual verbal and written instruction to review signs/symptoms of diabetes, desired ranges of glucose level fasting, after meals and with exercise. Advice that pre and post exercise glucose checks will be done for 3 sessions at entry of program.   Chronic Lung Diseases: - Group verbal and written instruction to review new updates, new respiratory medications, new advancements in procedures and treatments. Provide informative websites and "800" numbers of self-education.   Lung Procedures: - Group verbal and written instruction to describe testing methods done to diagnose lung disease. Review the outcome of test results. Describe the treatment choices: Pulmonary Function Tests, ABGs and oximetry.   Energy Conservation: - Provide group verbal and written instruction for methods to conserve energy, plan and organize  activities. Instruct on pacing techniques, use of adaptive equipment and posture/positioning to relieve shortness of breath.   Triggers: - Group verbal and written instruction to review types of environmental controls: home humidity, furnaces, filters, dust mite/pet prevention, HEPA vacuums. To discuss weather changes, air quality and the benefits of nasal washing.   Exacerbations: - Group verbal and written instruction to provide: warning signs, infection symptoms, calling MD promptly, preventive modes, and value of vaccinations. Review: effective airway clearance, coughing and/or vibration techniques. Create an Sports administrator.   Oxygen: - Individual and group verbal and written instruction on oxygen therapy. Includes supplement oxygen, available portable oxygen systems, continuous and intermittent flow rates, oxygen safety, concentrators, and Medicare reimbursement for oxygen.   Respiratory Medications: - Group verbal and written instruction to review medications for lung disease. Drug class, frequency, complications, importance of spacers, rinsing mouth after steroid MDI's, and proper cleaning methods for nebulizers.   AED/CPR: - Group verbal and written instruction with the use of models to demonstrate the basic use of the AED with the basic ABC's of resuscitation.   Breathing Retraining: - Provides individuals verbal and written instruction on purpose, frequency, and proper technique of diaphragmatic breathing and pursed-lipped breathing. Applies individual practice skills.      Pulmonary Rehab from 08/16/2015 in [facility] Cardiac and Pulmonary Rehab   Date  05/23/15   Educator  LB   Instruction Review Code  2- meets goals/outcomes      Anatomy and Physiology of the Lungs: - Group verbal and written instruction with the use of models to provide basic lung anatomy and physiology related to function, structure and complications of lung disease.   Heart Failure: - Group verbal and  written instruction on the basics of  heart failure: signs/symptoms, treatments, explanation of ejection fraction, enlarged heart and cardiomyopathy.   Sleep Apnea: - Individual verbal and written instruction to review Obstructive Sleep Apnea. Review of risk factors, methods for diagnosing and types of masks and machines for OSA.   Anxiety: - Provides group, verbal and written instruction on the correlation between heart/lung disease and anxiety, treatment options, and management of anxiety.   Relaxation: - Provides group, verbal and written instruction about the benefits of relaxation for patients with heart/lung disease. Also provides patients with examples of relaxation techniques.   Knowledge Questionnaire Score:    Core Components/Risk Factors/Patient Goals at Admission:   Core Components/Risk Factors/Patient Goals Review:      Goals and Risk Factor Review      07/31/15 1038 08/02/15 1000 08/02/15 1056 08/02/15 1121 08/09/15 1101   Core Components/Risk Factors/Patient Goals Review   Personal Goals Review   Diabetes;Hypertension Diabetes;Hypertension Improve shortness of breath with ADL's;Develop more efficient breathing techniques such as purse lipped breathing and diaphragmatic breathing and practicing self-pacing with activity.   Increase Aerobic Exercise and Physical Activity (read-only)   Goals Progress/Improvement seen  Yes  Yes     Comments [name] has returned to class after an extended absense due to a fall and knee injury. She stated that her knee is tolerating the exercise well. Options for [employer] classes were discussed with [name] as she has a $20 copay.  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  Explained to [name] that there is a waiting list for the 1:15 FF; she will continue in [place] and wait for the opening. Manasi said she was dizzy again today but after blood pressure was 110/70  with pulse 76 and non fasting fingerstick blood sugar was 192. (Had breakfast this am).Javon was able to go exercise on the Biostep sitting down after her dizziness passed after 360 cc of water.      Understand more about Heart/Pulmonary Disease (read-only)   Goals Progress/Improvement seen    Yes     Comments   I asked Deeksha if she is on a fluid restriction since some people with Heart failure are. She reported no so given 360 cc water per above after she c/o dizziness. [name] denies vertigo history.      Improve shortness of breath with ADL's (read-only)   Goals Progress/Improvement seen      Met   Comments     [name] states she does not experience SOB symptoms. She stated that she does tire easily. She does stop when she is tired and will slow down and pace herself to prevent herself from gettin overtired.  She hopes to see an increase in  stamina as she continues with the exercise prescription with the program.   Breathing Techniques (read-only)   Goals Progress/Improvement seen      Yes   Comments     Reviewed the technique for PLB with [name] today.  She stared and demonstrated good PLB technique.  Reminded her to use this throughout her exercise and when exerting herself in any activity.   Diabetes (read-only)   Goal    Blood glucose control identified by blood glucose values, HgbA1C. Participant verbalizes understanding of the signs/symptoms of hyper/hypo glycemia, proper foot care and importance of medication and nutrition plan for blood glucose control.    Progress seen towards goals   Yes Yes  Comments   Blood sugar after breakfast today when she c./o dizziness was 192. Lou had breakfast this am.  [name] sysates her A1c is around 7.4%.  She has maintained good control with ther blood sugars and is aware of her meds and nutriton/exercise requirements to help maintain good Blood sugar contro.    Hypertension (read-only)   Goal    Participant will see blood pressure controlled within the values of  140/14m/Hg or within value directed by their physician.    Progress seen toward goals   Yes Yes    Comments   Blood pressure today when she c/o dizziness was 110/70 BP ranges are in good ranges by chart review.  LYork Cerisetalked about keeping up with her weight every day and when to call her MD about 3 pounds weight gain. "I take Lasix 80 mg one day and then 40 mg the next"    LYork Ceriseis aware of what she needs to do by keeping up with er meds/nutrition and exercise to help keep her BP in noraml range.       08/23/15 1000           Core Components/Risk Factors/Patient Goals Review   Personal Goals Review Sedentary       Review Sarah CStreckhas improved with her exercise to 276m/ NS and BioStep. She is stronger with her walk in and out and even requested an increase in her Level on the BioStep.       Expected Outcomes Continue increasing resistance on the NS and BioStep within the next 2 weeks.          Core Components/Risk Factors/Patient Goals at Discharge (Final Review):      Goals and Risk Factor Review - 08/23/15 1000    Core Components/Risk Factors/Patient Goals Review   Personal Goals Review Sedentary   Review Sarah ClCraytonas improved with her exercise to 2067m NS and BioStep. She is stronger with her walk in and out and even requested an increase in her Level on the BioStep.   Expected Outcomes Continue increasing resistance on the NS and BioStep within the next 2 weeks.      ITP Comments:     ITP Comments      06/20/15 1133 07/26/15 1000 07/26/15 1053 07/31/15 1000 07/31/15 1023   ITP Comments [name] attended 05/31/2015. She has had some knee problems, but plans to return the first of the year. Sarah ClaYorkturned to [employer] a month of resting her lt knee; she exercised 32 minutes and omitted weights; a good first day back to exercise. Personal and exercise goals expected to be met in 32 more sessions. Progress on specific individualized goals will be charted in patient's ITP.  Upon completion of the program the patient will be comfortable managing exercise goals and progression on their own.  [name] early today; she was tired and had episodes of dizziness; glucous level checked 174; Sarah ClaCohill under a lot of stress with family. Personal and exercise goals expected to be met in 31 more sessions. Progress on specific individualized goals will be charted in patient's ITP. Upon completion of the program the patient will be comfortable managing exercise goals and progression on their own.      08/02/15 1102 08/09/15 1100 08/14/15 1035 08/16/15 1058 08/30/15 1614   ITP Comments [name] said she was dizzy again today but after blood pressure was 110/70 with pulse 76 and non fasting fingerstick blood sugar was 192. (Had  breakfast this am).[name] was able to go exercise on the Biostep sitting down after her dizziness passed after 360 cc of water.  Expected to meet program and personal goals by end of program in 29 more sessions. Personal and exercise goals expected to be met in 28 more sessions. Progress on specific individualized goals will be charted in patient's ITP. Upon completion of the program the patient will be comfortable managing exercise goals and progression on their own.  Personal and exercise goals expected to be met in 27 more sessions. Progress on specific individualized goals will be charted in patient's ITP. Upon completion of the program the patient will be comfortable managing exercise goals and progression on their own.  [name] called and states she fell and hit her head on the bed and would not be able to exercise this week.     11/07/15 0634           ITP Comments Sarah Brickley has been out of [facility] due to a pneumonia hospitalization and then home physical therapy. She plans to return to [place]. Sarah Friedland last attended 08/23/15.          Comments: 30 day review

## 2015-12-08 ENCOUNTER — Telehealth: Payer: Self-pay | Admitting: Respiratory Therapy

## 2015-12-08 NOTE — Telephone Encounter (Signed)
I called Ms. [name] about continuing [facility].  She does not have a voicemail box that has been set up.  She has not been to exercise since3/06/2015.  She was having PT come to her home.  Need to check to see if PT is still working with her.

## 2015-12-13 ENCOUNTER — Telehealth: Payer: Self-pay | Admitting: Respiratory Therapy

## 2015-12-13 NOTE — Telephone Encounter (Signed)
Called [name] - last attended 08/23/2015. She has had another fall 3 weeks ago with head injury and  leg strain. Sarah Stryker requested discharge from [facility] and will return in the future, if she is able to exercise in [place].

## 2015-12-15 ENCOUNTER — Encounter: Payer: Self-pay | Admitting: Respiratory Therapy

## 2015-12-15 DIAGNOSIS — I5043 Acute on chronic combined systolic (congestive) and diastolic (congestive) heart failure: Secondary | ICD-10-CM

## 2015-12-15 NOTE — Progress Notes (Signed)
Pulmonary Individual Treatment Plan  Patient Details  [name] MRN: [id number] Date of Birth: [birth year] Referring Provider: Dr. Wendie Chess   Initial Encounter Date: 05/23/2015      Pulmonary Rehab from 05/23/2015 in [place]   Date  05/23/15      Visit Diagnosis: CHF, acute on Chronic  Patient's Home Medications on Admission:  Current outpatient prescriptions:  .  apixaban (ELIQUIS) 5 MG TABS tablet, Take 5 mg by mouth 2 (two) times daily., Disp: , Rfl:  .  baclofen (LIORESAL) 10 MG tablet, Take 10 mg by mouth 3 (three) times daily., Disp: , Rfl:  .  budesonide-formoterol (SYMBICORT) 160-4.5 MCG/ACT inhaler, Inhale 2 puffs into the lungs 2 (two) times daily., Disp: 1 Inhaler, Rfl: 12 .  carvedilol (COREG) 6.25 MG tablet, Take 6.25 mg by mouth 2 (two) times daily with a meal. , Disp: , Rfl:  .  cholecalciferol (VITAMIN D) 1000 units tablet, Take 1,000 Units by mouth daily., Disp: , Rfl:  .  docusate sodium (COLACE) 100 MG capsule, Take 100 mg by mouth 2 (two) times daily. , Disp: , Rfl:  .  doxycycline (VIBRAMYCIN) 50 MG capsule, Take 1 capsule (50 mg total) by mouth 2 (two) times daily., Disp: 10 capsule, Rfl: 0 .  escitalopram (LEXAPRO) 10 MG tablet, Take 10 mg by mouth daily., Disp: , Rfl:  .  fentaNYL (DURAGESIC - DOSED MCG/HR) 50 MCG/HR, Place 50 mcg onto the skin every other day. , Disp: , Rfl:  .  ferrous sulfate 325 (65 FE) MG tablet, Take 325 mg by mouth daily with breakfast., Disp: , Rfl:  .  furosemide (LASIX) 40 MG tablet, Take 40 mg by mouth every other day. Pt alternates with the 29m tablet., Disp: , Rfl:  .  furosemide (LASIX) 80 MG tablet, Take 80 mg by mouth every other day. Pt alternates with the 422mtablet., Disp: , Rfl:  .  glimepiride (AMARYL) 4 MG tablet, Take 4 mg by mouth 2 (two) times daily after a meal., Disp: , Rfl:  .  losartan (COZAAR) 25 MG tablet, Take 25 mg by mouth daily., Disp: , Rfl:  .   magnesium oxide (MAG-OX) 400 MG tablet, Take 400 mg by mouth daily., Disp: , Rfl:  .  Melatonin 1 MG TABS, Take 1 mg by mouth at bedtime., Disp: , Rfl:  .  metolazone (ZAROXOLYN) 2.5 MG tablet, Take 2.5 mg by mouth 2 (two) times a week. Pt takes on Monday and Friday., Disp: , Rfl:  .  mupirocin ointment (BACTROBAN) 2 %, Apply 1 application topically 3 (three) times daily., Disp: 22 g, Rfl: 0 .  oxyCODONE (OXY IR/ROXICODONE) 5 MG immediate release tablet, Take 5-10 mg by mouth every 4 (four) hours as needed for severe pain. , Disp: , Rfl:  .  pantoprazole (PROTONIX) 40 MG tablet, Take 40 mg by mouth daily., Disp: , Rfl:  .  potassium chloride (KLOR-CON) 20 MEQ packet, Take by mouth 2 (two) times daily., Disp: , Rfl:  .  predniSONE (STERAPRED UNI-PAK 21 TAB) 10 MG (21) TBPK tablet, Take 1 tablet (10 mg total) by mouth daily. Start 60 mg po daily, taper 10 mg daily until done, Disp: 21 tablet, Rfl: 0 .  rOPINIRole (REQUIP) 0.5 MG tablet, Take 0.5 mg by mouth 2 (two) times daily., Disp: , Rfl:  .  sitaGLIPtin (JANUVIA) 100 MG tablet, Take 100 mg by mouth daily., Disp: , Rfl:  .  spironolactone (ALDACTONE)  25 MG tablet, Take 25 mg by mouth daily. , Disp: , Rfl:  .  vitamin B-12 (CYANOCOBALAMIN) 1000 MCG tablet, Take 1,000 mcg by mouth daily., Disp: , Rfl:  .  vitamin C (ASCORBIC ACID) 500 MG tablet, Take 500 mg by mouth daily., Disp: , Rfl:  .  Vitamin D, Ergocalciferol, (DRISDOL) 50000 UNITS CAPS capsule, Take 50,000 Units by mouth every 7 (seven) days. Pt takes on Thursday., Disp: , Rfl:   Past Medical History: Past Medical History  Diagnosis Date  . CHF (congestive heart failure) ([place])   . Diabetes mellitus without complication ([place])   . Hypertension   . Chronic back pain   . Cancer ([place])     skin ca    Tobacco Use: History  Smoking status  . Never Smoker   Smokeless tobacco  . Never Used    Labs: Recent Review Flowsheet Data    Labs for ITP Cardiac and Pulmonary Rehab Latest Ref  Rng 03/04/2012 03/20/2015 09/06/2015   Cholestrol 0-200 mg/dL 101 - -   LDLCALC 0-100 mg/dL 60 - -   HDL 40-60 mg/dL 23(L) - -   Trlycerides 0-200 mg/dL 90 - -   Hemoglobin A1c 4.0 - 6.0 % 7.7(H) 7.0(H) -   HCO3 21.0 - 28.0 mEq/L - - 34.4(H)         POCT Glucose      07/26/15 1432           POCT Blood Glucose   Pre-Exercise #3 223 mg/dL       Post-Exercise #3 158 mg/dL          ADL UCSD:   Pulmonary Function Assessment:   Exercise Target Goals:    Exercise Program Goal: Individual exercise prescription set with THRR, safety & activity barriers. Participant demonstrates ability to understand and report RPE using BORG scale, to self-measure pulse accurately, and to acknowledge the importance of the exercise prescription.  Exercise Prescription Goal: Starting with aerobic activity 30 plus minutes a day, 3 days per week for initial exercise prescription. Provide home exercise prescription and guidelines that participant acknowledges understanding prior to discharge.  Activity Barriers & Risk Stratification:   6 Minute Walk:   Initial Exercise Prescription:   Perform Capillary Blood Glucose checks as needed.  Exercise Prescription Changes:     Exercise Prescription Changes      07/26/15 1000 07/31/15 1000 08/09/15 1300 08/14/15 1000 08/16/15 1000   Exercise Review   Progression No Yes Yes  Yes   Response to Exercise   Blood Pressure (Admit)   120/70 mmHg     Blood Pressure (Exercise)   118/62 mmHg     Blood Pressure (Exit)   90/62 mmHg     Heart Rate (Admit)   75 bpm     Heart Rate (Exercise)   91 bpm     Heart Rate (Exit)   75 bpm     Oxygen Saturation (Admit)   94 %     Oxygen Saturation (Exercise)   92 %     Oxygen Saturation (Exit)   90 %     Rating of Perceived Exertion (Exercise)   11     Perceived Dyspnea (Exercise)   0     Symptoms Some back and knee pain residual from her fall in December Complained of some tiredness today. Completed the exercise  with intervals and took rests when she needed them  Chronic pain Chronic pain Chronic pain   Comments Patient was able to resume  previous workloads after being out for over a month which was encouraging to her.  Reviewed individualized exercise prescription and made increases per departmental policy. Exercise increases were discussed with the patient and they were able to perform the new work loads without issue (no signs or symptoms).  [name] is making small progressions. Her back and knee pain and overall fragility make more aggressive progression not possible and not practical. [name] is using time intervals to exercise for longer periods and to build her endurance. She cannot exercise continuously for more than 10-15 minutes and our first goals is to steadily increase her time and build her endurance.  Maintaining time increases and doing well on the machines.  [name] is showing progress in her stamina and can tolerate longer exercise times on the machines.    Duration Progress to 30 minutes of continuous aerobic without signs/symptoms of physical distress Progress to 30 minutes of continuous aerobic without signs/symptoms of physical distress Progress to 30 minutes of continuous aerobic without signs/symptoms of physical distress Progress to 30 minutes of continuous aerobic without signs/symptoms of physical distress Progress to 30 minutes of continuous aerobic without signs/symptoms of physical distress   Intensity Rest + 30 Rest + 30 Rest + 30 Rest + 30 Rest + 30   Progression   Progression Continue progressive overload as per policy without signs/symptoms or physical distress. Continue progressive overload as per policy without signs/symptoms or physical distress. Continue progressive overload as per policy without signs/symptoms or physical distress. Continue progressive overload as per policy without signs/symptoms or physical distress. Continue progressive overload as per policy without signs/symptoms or  physical distress.   Resistance Training   Training Prescription (read-only) Yes Yes Yes Yes Yes   Weight (read-only) 1 1 1 1 1   Reps (read-only) 10-15 10-15 10-15 10-15 10-15   NuStep   Level (read-only) 2 2 2 2 2   Watts (read-only) 20 20 20 20 20   Minutes (read-only) 10 12 12 12 12   Biostep-RELP   Level (read-only) 2 2 2 2 2   Watts (read-only) 18 18 20 20 20   Minutes (read-only) 10 10 10 10 15     08/30/15 0600           Exercise Review   Progression Yes       Response to Exercise   Blood Pressure (Admit) 136/62 mmHg       Blood Pressure (Exercise) 122/62 mmHg       Blood Pressure (Exit) 120/62 mmHg       Heart Rate (Admit) 76 bpm       Heart Rate (Exercise) 78 bpm       Heart Rate (Exit) 72 bpm       Oxygen Saturation (Admit) 91 %       Oxygen Saturation (Exercise) 95 %       Oxygen Saturation (Exit) 90 %       Rating of Perceived Exertion (Exercise) 11       Perceived Dyspnea (Exercise) 0       Symptoms Chronic Pain       Duration Progress to 45 minutes of aerobic exercise without signs/symptoms of physical distress       Intensity THRR unchanged       Progression   Progression Continue progressive overload as per policy without signs/symptoms or physical distress.       NuStep   Level 2       Watts 40         Minutes 20       Biostep-RELP   Level 2       Watts 20       Minutes 20          Exercise Comments:   Discharge Exercise Prescription (Final Exercise Prescription Changes):     Exercise Prescription Changes - 08/30/15 0600    Exercise Review   Progression Yes   Response to Exercise   Blood Pressure (Admit) 136/62 mmHg   Blood Pressure (Exercise) 122/62 mmHg   Blood Pressure (Exit) 120/62 mmHg   Heart Rate (Admit) 76 bpm   Heart Rate (Exercise) 78 bpm   Heart Rate (Exit) 72 bpm   Oxygen Saturation (Admit) 91 %   Oxygen Saturation (Exercise) 95 %   Oxygen Saturation (Exit) 90 %   Rating of Perceived Exertion (Exercise) 11   Perceived  Dyspnea (Exercise) 0   Symptoms Chronic Pain   Duration Progress to 45 minutes of aerobic exercise without signs/symptoms of physical distress   Intensity THRR unchanged   Progression   Progression Continue progressive overload as per policy without signs/symptoms or physical distress.   NuStep   Level 2   Watts 40   Minutes 20   Biostep-RELP   Level 2   Watts 20   Minutes 20       Nutrition:  Target Goals: Understanding of nutrition guidelines, daily intake of sodium <1576m, cholesterol <2070m calories 30% from fat and 7% or less from saturated fats, daily to have 5 or more servings of fruits and vegetables.  Biometrics:    Nutrition Therapy Plan and Nutrition Goals:   Nutrition Discharge: Rate Your Plate Scores:   Psychosocial: Target Goals: Acknowledge presence or absence of depression, maximize coping skills, provide positive support system. Participant is able to verbalize types and ability to use techniques and skills needed for reducing stress and depression.  Initial Review & Psychosocial Screening:   Quality of Life Scores:   PHQ-9:     Recent Review Flowsheet Data    [facility] 05/23/2015 04/10/2015   Decreased Interest 1 3   Down, Depressed, Hopeless 2 0   PHQ - 2 Score 3 3   Altered sleeping 3 3   Tired, decreased energy 2 3   Change in appetite 0 0   Feeling bad or failure about yourself  2 3   Trouble concentrating 0 0   Moving slowly or fidgety/restless 1 0   Suicidal thoughts 1 0   PHQ-9 Score 12 12   Difficult doing work/chores Somewhat difficult Somewhat difficult      Psychosocial Evaluation and Intervention:   Psychosocial Re-Evaluation:     Psychosocial Re-Evaluation      08/02/15 1101 08/14/15 1057 08/23/15 1000       Psychosocial Re-Evaluation   Interventions Encouraged to attend Cardiac Rehabilitation for the exercise       Comments Dizziness at times with exercise so Ma[place]ann[place]so said she has chronic back  pain that limits her that she wishes she doesn't have but she reports she has learned to live with it.  Counselor met with Sarah. [employer] follow up evaluation.  She reports some increased strength since beginning this class.  She continues to worry a great deal about her spouse's health which has decilined.  She continues to have good support with help that comes in 3 times each week.  [place] from [place] coming this Wednesday to  support her spouse due to  his medical procedures scheduled for this Friday.  Sarah. Kronberg states her grandson is getting married April 1st and she is hoping she and spouse will be well enough to attend.  Counselor will continue to follow with Sarah. [place] in the future.   Sarah Achorn is very stressed with her husbands health. His injections in his back have not helped, and he now has a blocked caroid artery. I will speak with our mental health councilor to check with Sarah Drennan. Through this all, she still attends [facility] regularly, and it seems to help her emotionally.       Education: Education Goals: Education classes will be provided on a weekly basis, covering required topics. Participant will state understanding/return demonstration of topics presented.  Learning Barriers/Preferences:   Education Topics: Initial Evaluation Education: - Verbal, written and demonstration of respiratory meds, RPE/PD scales, oximetry and breathing techniques. Instruction on use of nebulizers and MDIs: cleaning and proper use, rinsing mouth with steroid doses and importance of monitoring MDI activations.          Pulmonary Rehab from 08/16/2015 in [facility] Cardiac and Pulmonary Rehab   Date  05/23/15   Educator  LB   Instruction Review Code  2- meets goals/outcomes      General Nutrition Guidelines/Fats and Fiber: -Group instruction provided by verbal, written material, models and posters to present the general guidelines for heart healthy nutrition. Gives an explanation and  review of dietary fats and fiber.      Pulmonary Rehab from 08/16/2015 in [facility] Cardiac and Pulmonary Rehab   Date  07/31/15 Marcelina Morel attended 05/29/15]   Educator  CR   Instruction Review Code  2- meets goals/outcomes      Controlling Sodium/Reading Food Labels: -Group verbal and written material supporting the discussion of sodium use in heart healthy nutrition. Review and explanation with models, verbal and written materials for utilization of the food label.   Exercise Physiology & Risk Factors: - Group verbal and written instruction with models to review the exercise physiology of the cardiovascular system and associated critical values. Details cardiovascular disease risk factors and the goals associated with each risk factor.      Pulmonary Rehab from 08/16/2015 in [facility] Cardiac and Pulmonary Rehab   Date  05/31/15   Educator  SW   Instruction Review Code  2- meets goals/outcomes      Aerobic Exercise & Resistance Training: - Gives group verbal and written discussion on the health impact of inactivity. On the components of aerobic and resistive training programs and the benefits of this training and how to safely progress through these programs.   Flexibility, Balance, General Exercise Guidelines: - Provides group verbal and written instruction on the benefits of flexibility and balance training programs. Provides general exercise guidelines with specific guidelines to those with heart or lung disease. Demonstration and skill practice provided.   Stress Management: - Provides group verbal and written instruction about the health risks of elevated stress, cause of high stress, and healthy ways to reduce stress.      Pulmonary Rehab from 08/16/2015 in [facility] Cardiac and Pulmonary Rehab   Date  08/16/15   Educator  [facility]   Instruction Review Code  2- meets goals/outcomes      Depression: - Provides group verbal and written instruction on the correlation between heart/lung disease and  depressed mood, treatment options, and the stigmas associated with seeking treatment.   Exercise & Equipment Safety: - Individual verbal instruction and demonstration of equipment use and  safety with use of the equipment.      Pulmonary Rehab from 08/16/2015 in [facility] Cardiac and Pulmonary Rehab   Date  05/29/15   Educator  LB   Instruction Review Code  2- meets goals/outcomes      Infection Prevention: - Provides verbal and written material to individual with discussion of infection control including proper hand washing and proper equipment cleaning during exercise session.      Pulmonary Rehab from 08/16/2015 in [facility] Cardiac and Pulmonary Rehab   Date  05/29/15   Educator  LB   Instruction Review Code  2- meets goals/outcomes      Falls Prevention: - Provides verbal and written material to individual with discussion of falls prevention and safety.      Pulmonary Rehab from 08/16/2015 in [facility] Cardiac and Pulmonary Rehab   Date  05/23/15   Educator  LB   Instruction Review Code  2- meets goals/outcomes      Diabetes: - Individual verbal and written instruction to review signs/symptoms of diabetes, desired ranges of glucose level fasting, after meals and with exercise. Advice that pre and post exercise glucose checks will be done for 3 sessions at entry of program.   Chronic Lung Diseases: - Group verbal and written instruction to review new updates, new respiratory medications, new advancements in procedures and treatments. Provide informative websites and "800" numbers of self-education.   Lung Procedures: - Group verbal and written instruction to describe testing methods done to diagnose lung disease. Review the outcome of test results. Describe the treatment choices: Pulmonary Function Tests, ABGs and oximetry.   Energy Conservation: - Provide group verbal and written instruction for methods to conserve energy, plan and organize activities. Instruct on pacing techniques, use  of adaptive equipment and posture/positioning to relieve shortness of breath.   Triggers: - Group verbal and written instruction to review types of environmental controls: home humidity, furnaces, filters, dust mite/pet prevention, HEPA vacuums. To discuss weather changes, air quality and the benefits of nasal washing.   Exacerbations: - Group verbal and written instruction to provide: warning signs, infection symptoms, calling MD promptly, preventive modes, and value of vaccinations. Review: effective airway clearance, coughing and/or vibration techniques. Create an Action Plan.   Oxygen: - Individual and group verbal and written instruction on oxygen therapy. Includes supplement oxygen, available portable oxygen systems, continuous and intermittent flow rates, oxygen safety, concentrators, and Medicare reimbursement for oxygen.   Respiratory Medications: - Group verbal and written instruction to review medications for lung disease. Drug class, frequency, complications, importance of spacers, rinsing mouth after steroid MDI's, and proper cleaning methods for nebulizers.   AED/CPR: - Group verbal and written instruction with the use of models to demonstrate the basic use of the AED with the basic ABC's of resuscitation.   Breathing Retraining: - Provides individuals verbal and written instruction on purpose, frequency, and proper technique of diaphragmatic breathing and pursed-lipped breathing. Applies individual practice skills.      Pulmonary Rehab from 08/16/2015 in [facility] Cardiac and Pulmonary Rehab   Date  05/23/15   Educator  LB   Instruction Review Code  2- meets goals/outcomes      Anatomy and Physiology of the Lungs: - Group verbal and written instruction with the use of models to provide basic lung anatomy and physiology related to function, structure and complications of lung disease.   Heart Failure: - Group verbal and written instruction on the basics of heart failure:  signs/symptoms, treatments, explanation of ejection fraction,   enlarged heart and cardiomyopathy.   Sleep Apnea: - Individual verbal and written instruction to review Obstructive Sleep Apnea. Review of risk factors, methods for diagnosing and types of masks and machines for OSA.   Anxiety: - Provides group, verbal and written instruction on the correlation between heart/lung disease and anxiety, treatment options, and management of anxiety.   Relaxation: - Provides group, verbal and written instruction about the benefits of relaxation for patients with heart/lung disease. Also provides patients with examples of relaxation techniques.   Knowledge Questionnaire Score:    Core Components/Risk Factors/Patient Goals at Admission:   Core Components/Risk Factors/Patient Goals Review:      Goals and Risk Factor Review      07/31/15 1038 08/02/15 1000 08/02/15 1056 08/02/15 1121 08/09/15 1101   Core Components/Risk Factors/Patient Goals Review   Personal Goals Review   Diabetes;Hypertension Diabetes;Hypertension Improve shortness of breath with ADL's;Develop more efficient breathing techniques such as purse lipped breathing and diaphragmatic breathing and practicing self-pacing with activity.   Increase Aerobic Exercise and Physical Activity (read-only)   Goals Progress/Improvement seen  Yes  Yes     Comments [name] has returned to class after an extended absense due to a fall and knee injury. She stated that her knee is tolerating the exercise well. Options for [employer] classes were discussed with [name] as she has a $20 copay.  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  Explained to Sarah Oh that there is a waiting list for the 1:15 FF; she will continue in [place] and wait for the opening. [name] said she was dizzy again today but after blood pressure was 110/70 with pulse 76 and non fasting fingerstick blood  sugar was 192. (Had breakfast this am).[name] was able to go exercise on the Biostep sitting down after her dizziness passed after 360 cc of water.      Understand more about Heart/Pulmonary Disease (read-only)   Goals Progress/Improvement seen    Yes     Comments   I asked [name] if she is on a fluid restriction since some people with Heart failure are. She reported no so given 360 cc water per above after she c/o dizziness. [name] denies vertigo history.      Improve shortness of breath with ADL's (read-only)   Goals Progress/Improvement seen      Met   Comments     [name] states she does not experience SOB symptoms. She stated that she does tire easily. She does stop when she is tired and will slow down and pace herself to prevent herself from gettin overtired.  She hopes to see an increase in  stamina as she continues with the exercise prescription with the program.   Breathing Techniques (read-only)   Goals Progress/Improvement seen      Yes   Comments     Reviewed the technique for PLB with [name] today.  She stared and demonstrated good PLB technique.  Reminded her to use this throughout her exercise and when exerting herself in any activity.   Diabetes (read-only)   Goal    Blood glucose control identified by blood glucose values, HgbA1C. Participant verbalizes understanding of the signs/symptoms of hyper/hypo glycemia, proper foot care and importance of medication and nutrition plan for blood glucose control.    Progress seen towards goals   Yes Yes    Comments   Blood sugar after breakfast  today when she c./o dizziness was 192. [name] had breakfast this am.  [name] her A1c is around 7.4%.  She has maintained good control with ther blood sugars and is aware of her meds and nutriton/exercise requirements to help maintain good Blood sugar contro.    Hypertension (read-only)   Goal    Participant will see blood pressure controlled within the values of 140/90mm/Hg or within value directed by their  physician.    Progress seen toward goals   Yes Yes    Comments   Blood pressure today when she c/o dizziness was 110/70 BP ranges are in good ranges by chart review.  [name] about keeping up with her weight every day and when to call her MD about 3 pounds weight gain. "I take Lasix 80 mg one day and then 40 mg the next"    [name] is aware of what she needs to do by keeping up with er meds/nutrition and exercise to help keep her BP in [place]aml range.       08/23/15 1000           Core Components/Risk Factors/Patient Goals Review   Personal Goals Review Sedentary       Review [name] has improved with her exercise to 20min/ NS and BioStep. She is stronger with her walk in and out and even requested an increase in her Level on the BioStep.       Expected Outcomes Continue increasing resistance on the NS and BioStep within the next 2 weeks.          Core Components/Risk Factors/Patient Goals at Discharge (Final Review):      Goals and Risk Factor Review - 08/23/15 1000    Core Components/Risk Factors/Patient Goals Review   Personal Goals Review Sedentary   Review [name] has improved with her exercise to 20min/ NS and BioStep. She is stronger with her walk in and out and even requested an increase in her Level on the BioStep.   Expected Outcomes Continue increasing resistance on the NS and BioStep within the next 2 weeks.      ITP Comments:     ITP Comments      06/20/15 1133 07/26/15 1000 07/26/15 1053 07/31/15 1000 07/31/15 1023   ITP Comments [name] last attended 05/31/2015. She has had some knee problems, but plans to return the first of the year. [name] returned to [facility] after a month of resting her lt knee; she exercised 32 minutes and omitted weights; a good first day back to exercise. Personal and exercise goals expected to be met in 32 more sessions. Progress on specific individualized goals will be charted in patient's ITP. Upon completion of the program the patient will  be comfortable managing exercise goals and progression on their own.  [name] left early today; she was tired and had episodes of dizziness; glucous level checked 174; [name] is under a lot of stress with family. Personal and exercise goals expected to be met in 31 more sessions. Progress on specific individualized goals will be charted in patient's ITP. Upon completion of the program the patient will be comfortable managing exercise goals and progression on their own.      08/02/15 1102 08/09/15 1100 08/14/15 1035 08/16/15 1058 08/30/15 1614   ITP Comments [name] said she was dizzy again today but after blood pressure was 110/70 with pulse 76 and non fasting fingerstick blood sugar was 192. (Had breakfast this am).[name] was able to go   exercise on the Biostep sitting down after her dizziness passed after 360 cc of water.  Expected to meet program and personal goals by end of program in 29 more sessions. Personal and exercise goals expected to be met in 28 more sessions. Progress on specific individualized goals will be charted in patient's ITP. Upon completion of the program the patient will be comfortable managing exercise goals and progression on their own.  Personal and exercise goals expected to be met in 27 more sessions. Progress on specific individualized goals will be charted in patient's ITP. Upon completion of the program the patient will be comfortable managing exercise goals and progression on their own.  [name] called and states she fell and hit her head on the bed and would not be able to exercise this week.     11/07/15 0634           ITP Comments [name] has been out of [facility] due to a pneumonia hospitalization and then home physical therapy. She plans to return to [facility]. [name] last attended 08/23/15.          Comments: Sarah. Hoffman late attended [facility] on 08/23/2015. She had a fall 3 weeks ago with a head injury and leg strain and has requested to be discharged from  [facility].  She hopes to return in the future if she is able. Thank you for the opportunity to work with your patient .  

## 2016-02-29 ENCOUNTER — Other Ambulatory Visit
Admission: RE | Admit: 2016-02-29 | Discharge: 2016-02-29 | Disposition: A | Discharge status: Home / Self Care | Payer: Medicare Other | Source: Ambulatory Visit | Attending: Physician Assistant | Admitting: Physician Assistant

## 2016-02-29 DIAGNOSIS — R0602 Shortness of breath: Secondary | ICD-10-CM | POA: Diagnosis not present

## 2016-02-29 DIAGNOSIS — I5022 Chronic systolic (congestive) heart failure: Secondary | ICD-10-CM | POA: Diagnosis present

## 2016-02-29 LAB — BRAIN NATRIURETIC PEPTIDE: B Natriuretic Peptide: 370 pg/mL — ABNORMAL HIGH (ref 0.0–100.0)

## 2016-03-02 ENCOUNTER — Encounter: Payer: Self-pay | Admitting: Emergency Medicine

## 2016-03-02 ENCOUNTER — Emergency Department
Admission: EM | Admit: 2016-03-02 | Discharge: 2016-03-03 | Disposition: A | Discharge status: Home / Self Care | Payer: Medicare Other | Attending: Emergency Medicine | Admitting: Emergency Medicine

## 2016-03-02 DIAGNOSIS — Z79899 Other long term (current) drug therapy: Secondary | ICD-10-CM | POA: Insufficient documentation

## 2016-03-02 DIAGNOSIS — E119 Type 2 diabetes mellitus without complications: Secondary | ICD-10-CM | POA: Insufficient documentation

## 2016-03-02 DIAGNOSIS — I8001 Phlebitis and thrombophlebitis of superficial vessels of right lower extremity: Secondary | ICD-10-CM | POA: Diagnosis not present

## 2016-03-02 DIAGNOSIS — I5023 Acute on chronic systolic (congestive) heart failure: Secondary | ICD-10-CM | POA: Insufficient documentation

## 2016-03-02 DIAGNOSIS — Z85828 Personal history of other malignant neoplasm of skin: Secondary | ICD-10-CM | POA: Diagnosis not present

## 2016-03-02 DIAGNOSIS — I110 Hypertensive heart disease with heart failure: Secondary | ICD-10-CM | POA: Insufficient documentation

## 2016-03-02 DIAGNOSIS — M79661 Pain in right lower leg: Secondary | ICD-10-CM | POA: Diagnosis present

## 2016-03-02 DIAGNOSIS — I809 Phlebitis and thrombophlebitis of unspecified site: Secondary | ICD-10-CM

## 2016-03-02 NOTE — ED Notes (Signed)
Patient moved to ED 6 by this RN. Offered a wheelchair to go to the room; refused. Husband states, "She will get there. She is just in slow gear". RN apologized several times about the wait time; husband seems content. Despite constant communication while in the lobby, the patient's husband continues to be upset. This RN asked that the charge nurse go in and speak with the patient and his wife.

## 2016-03-02 NOTE — ED Notes (Signed)
Second visitor arrived and to the desk inquiring about wait times. Updated based on status board; quoted that there is still one patient in front of them and that we were working diligently to get them back to the room. Visitor understanding and returned to her seat. Checked with charge nurse; no available beds at this time. Will continue to keep patient and visitor aware of room status.

## 2016-03-02 NOTE — ED Notes (Signed)
FIRST NURSE NOTE: Patient presents with c/o pain to her lower extremity. Patient reports that a "big knot" has emerged and she is concerned that it may be a blood clot.

## 2016-03-02 NOTE — ED Notes (Signed)
RN has rounded on this patient several times and updated them on wait. Husband remains a bit upset because of the wait, however seems to be understanding when advised as to why they are having to wait. Husband reading the newspaper at this time and seems content. Will continue to follow up.

## 2016-03-02 NOTE — ED Notes (Signed)
Patient's husband back to the desk asking about wait time. Updated once again. This RN has been in constant communication with the charge nurse about the wait times and getting this patient in the bed. This RN asked patient relations to speak with patient and her family.

## 2016-03-02 NOTE — ED Notes (Addendum)
Patient's husband to the desk once again. Inquires made and updated on wait time. Husbnad states, "I have been being told the same thing since 9:00". RN advised that ambulances are coming and that there were very sick patients tonight. Husband states, "I understand that, but she is sick". RN explained that his wife's symptoms were not being discounted, however there were people that had to have the beds before her at this point. Charge nurse aware.

## 2016-03-02 NOTE — ED Triage Notes (Signed)
Pt ambulatory to triage, reports bump on right lower leg, noticed this morning, states area very tender and sore.  Pt NAD at this time, resp equal and unlabored, skin warm and dry.  Pt reports hx of same, states it was just fluid before.

## 2016-03-03 NOTE — ED Notes (Signed)
Pt's son, Layten Klepp, called to let facility know that his mother has been having some crackles this week, per his sister who is in the medical profession. Son advised that his mother has "really bad" CHF and had double pneumonia the last time she was in the hospital. Son advised they had to do a CT scan because the X-ray didn't show the pneumonia.

## 2016-03-03 NOTE — ED Notes (Signed)
Patient asking how much longer until they were seen by a MD.  I educated them on the constant reprioritization of emergency medicine and just because they wait the longest or are seen last simply means there were sicker people than them here.  I also explained we have limited resources for a constantly shrinking and expanding population of people visiting [place] at any point in time.

## 2016-03-03 NOTE — ED Provider Notes (Signed)
[facility] Emergency Department Provider Note  ____________________________________________  Time seen: Approximately 1:06 AM  I have reviewed the triage vital signs and the nursing notes.   HISTORY  Chief Complaint Cyst (right lower leg)    HPI [name] is a 82 y.o. female complains of a painful discolored area on her right medial calf, concerned that it is a blood clot. She is on Eliquis which she has been compliant with. No recent travel trauma hospitalizations or surgeries. No aggravating or alleviating factors. Nonradiating. Fever.     Past Medical History:  Diagnosis Date  . Cancer ([place])    skin ca  . CHF (congestive heart failure) ([place])   . Chronic back pain   . Diabetes mellitus without complication ([place])   . Hypertension      Patient Active Problem List   Diagnosis Date Noted  . Ventral hernia without obstruction or gangrene   . Pneumonia 09/07/2015  . Hyponatremia 09/07/2015  . Acute respiratory failure ([place]) 09/01/2015  . Chronic diastolic heart failure ([place]) 04/10/2015  . Atrial fibrillation ([place]) 04/10/2015  . Essential hypertension 04/10/2015  . Diabetes ([place]) 04/10/2015  . Back pain 04/10/2015  . Acute on chronic systolic CHF (congestive heart failure) ([place]) 03/21/2015  . Pulmonary nodule 11/10/2014     Past Surgical History:  Procedure Laterality Date  . ABDOMINAL HYSTERECTOMY    . BACK SURGERY     x3  . CORONARY STENT PLACEMENT     unknown location per pt  . FOOT SURGERY    . LEG SURGERY    . PACEMAKER PLACEMENT    . TONSILLECTOMY       Prior to Admission medications   Medication Sig Start Date End Date Taking? Authorizing Provider  apixaban (ELIQUIS) 5 MG TABS tablet Take 5 mg by mouth 2 (two) times daily.    Historical Provider, MD  baclofen (LIORESAL) 10 MG tablet Take 10 mg by mouth 3 (three) times daily.    Historical Provider, MD  budesonide-formoterol (SYMBICORT) 160-4.5 MCG/ACT inhaler Inhale  2 puffs into the lungs 2 (two) times daily. 09/03/15   Gladstone Lighter, MD  carvedilol (COREG) 6.25 MG tablet Take 6.25 mg by mouth 2 (two) times daily with a meal.     Historical Provider, MD  cholecalciferol (VITAMIN D) 1000 units tablet Take 1,000 Units by mouth daily.    Historical Provider, MD  docusate sodium (COLACE) 100 MG capsule Take 100 mg by mouth 2 (two) times daily.     Historical Provider, MD  doxycycline (VIBRAMYCIN) 50 MG capsule Take 1 capsule (50 mg total) by mouth 2 (two) times daily. 10/17/15   Lorin Picket, PA-C  escitalopram (LEXAPRO) 10 MG tablet Take 10 mg by mouth daily.    Historical Provider, MD  fentaNYL (DURAGESIC - DOSED MCG/HR) 50 MCG/HR Place 50 mcg onto the skin every other day.     Historical Provider, MD  ferrous sulfate 325 (65 FE) MG tablet Take 325 mg by mouth daily with breakfast.    Historical Provider, MD  furosemide (LASIX) 40 MG tablet Take 40 mg by mouth every other day. Pt alternates with the 80mg  tablet.    Historical Provider, MD  furosemide (LASIX) 80 MG tablet Take 80 mg by mouth every other day. Pt alternates with the 40mg  tablet.    Historical Provider, MD  glimepiride (AMARYL) 4 MG tablet Take 4 mg by mouth 2 (two) times daily after a meal.    Historical Provider, MD  losartan (COZAAR) 25 MG tablet Take 25 mg by mouth daily.    Historical Provider, MD  magnesium oxide (MAG-OX) 400 MG tablet Take 400 mg by mouth daily.    Historical Provider, MD  Melatonin 1 MG TABS Take 1 mg by mouth at bedtime.    Historical Provider, MD  metolazone (ZAROXOLYN) 2.5 MG tablet Take 2.5 mg by mouth 2 (two) times a week. Pt takes on Monday and Friday.    Historical Provider, MD  mupirocin ointment (BACTROBAN) 2 % Apply 1 application topically 3 (three) times daily. 10/17/15   Lorin Picket, PA-C  oxyCODONE (OXY IR/ROXICODONE) 5 MG immediate release tablet Take 5-10 mg by mouth every 4 (four) hours as needed for severe pain.     Historical Provider, MD   pantoprazole (PROTONIX) 40 MG tablet Take 40 mg by mouth daily.    Historical Provider, MD  potassium chloride (KLOR-CON) 20 MEQ packet Take by mouth 2 (two) times daily.    Historical Provider, MD  predniSONE (STERAPRED UNI-PAK 21 TAB) 10 MG (21) TBPK tablet Take 1 tablet (10 mg total) by mouth daily. Start 60 mg po daily, taper 10 mg daily until done 09/13/15   Max Sane, MD  rOPINIRole (REQUIP) 0.5 MG tablet Take 0.5 mg by mouth 2 (two) times daily.    Historical Provider, MD  sitaGLIPtin (JANUVIA) 100 MG tablet Take 100 mg by mouth daily.    Historical Provider, MD  spironolactone (ALDACTONE) 25 MG tablet Take 25 mg by mouth daily.     Historical Provider, MD  vitamin B-12 (CYANOCOBALAMIN) 1000 MCG tablet Take 1,000 mcg by mouth daily.    Historical Provider, MD  vitamin C (ASCORBIC ACID) 500 MG tablet Take 500 mg by mouth daily.    Historical Provider, MD  Vitamin D, Ergocalciferol, (DRISDOL) 50000 UNITS CAPS capsule Take 50,000 Units by mouth every 7 (seven) days. Pt takes on Thursday.    Historical Provider, MD     Allergies Ceftriaxone; Cefuroxime; Cephalosporins; Codeine; Epinephrine; Morphine and related; Buprenorphine hcl; Ciprofloxacin; Latex; Lidocaine; and Procaine   Family History  Problem Relation Age of Onset  . CVA Mother   . Diabetes Mellitus II Sister   . Heart disease Father     Social History Social History  Substance Use Topics  . Smoking status: Never Smoker  . Smokeless tobacco: Never Used  . Alcohol use No    Review of Systems  Constitutional:   No fever or chills.  Cardiovascular:   No chest pain. Respiratory:   No dyspnea or cough. Musculoskeletal:   Right calf pain  10-point ROS otherwise negative.  ____________________________________________   PHYSICAL EXAM:  VITAL SIGNS: ED Triage Vitals  Enc Vitals Group     BP 03/02/16 1952 (!) 122/46     Pulse Rate 03/02/16 1952 75     Resp 03/02/16 1952 18     Temp 03/02/16 1952 97.7 F (36.5  C)     Temp Source 03/02/16 1952 Oral     SpO2 03/02/16 1952 94 %     Weight 03/02/16 1953 170 lb (77.1 kg)     Height 03/02/16 1953 5\' 7"  (1.702 m)     Head Circumference --      Peak Flow --      Pain Score 03/02/16 1953 5     Pain Loc --      Pain Edu? --      Excl. in [place]? --     Vital signs reviewed, nursing  assessments reviewed.   Constitutional:   Alert and oriented. Well appearing and in no distress. Eyes:   No scleral icterus.   EOMI.  ENT   Head:   Normocephalic and atraumatic.  Cardiovascular:   RRR. Symmetric bilateral DP pulses.  Normal capillary refill, warm extremities in the toes Musculoskeletal:   2+ pitting edema bilaterally. Left calf circumference greater than right, chronic per patient. Legs normal size for the patient. On the right medial calf there is a 2 cm area of purplish discoloration and firmness of the superficial tissues consistent with superficial thrombophlebitis. The area is slightly tender to the touch. Negative Homans sign. Posterior calf and thigh and popliteal fossa nontender. Neurologic:   Normal speech and language.   Motor grossly intact. No gross focal neurologic deficits are appreciated.  Skin:    Skin is warm, dry . Chronic wound in the plantar left foot, managed by podiatry.  ____________________________________________    LABS (pertinent positives/negatives) (all labs ordered are listed, but only abnormal results are displayed) Labs Reviewed - No data to display ____________________________________________   EKG    ____________________________________________    RADIOLOGY    ____________________________________________   PROCEDURES Procedures  ____________________________________________   INITIAL IMPRESSION / ASSESSMENT AND PLAN / ED COURSE  Pertinent labs & imaging results that were available during my care of the patient were reviewed by me and considered in my medical decision making (see chart for  details).  Well-appearing no acute distress. Presents with a painful spot on the right calf which appears to be superficial thrombophlebitis. Not consistent with DVT. Continue Eliquis. Continue aspirin. Tylenol as needed. Heat therapy. Follow up with primary care. Return precautions given.     Clinical Course   ____________________________________________   FINAL CLINICAL IMPRESSION(S) / ED DIAGNOSES  Final diagnoses:  Superficial thrombophlebitis       Portions of this note were generated with dragon dictation software. Dictation errors may occur despite best attempts at proofreading.    Carrie Mew, MD 03/03/16 0110

## 2016-03-15 ENCOUNTER — Ambulatory Visit
Admission: RE | Admit: 2016-03-15 | Discharge: 2016-03-15 | Disposition: A | Discharge status: Home / Self Care | Payer: Medicare Other | Source: Ambulatory Visit | Attending: Internal Medicine | Admitting: Internal Medicine

## 2016-03-15 ENCOUNTER — Other Ambulatory Visit: Payer: Self-pay | Admitting: Internal Medicine

## 2016-03-15 DIAGNOSIS — R05 Cough: Secondary | ICD-10-CM

## 2016-03-15 DIAGNOSIS — R590 Localized enlarged lymph nodes: Secondary | ICD-10-CM | POA: Insufficient documentation

## 2016-03-15 DIAGNOSIS — R0602 Shortness of breath: Secondary | ICD-10-CM

## 2016-03-15 DIAGNOSIS — R911 Solitary pulmonary nodule: Secondary | ICD-10-CM | POA: Insufficient documentation

## 2016-03-15 DIAGNOSIS — R059 Cough, unspecified: Secondary | ICD-10-CM

## 2016-04-26 NOTE — Progress Notes (Signed)
This encounter was created in error - please disregard.

## 2016-06-21 ENCOUNTER — Other Ambulatory Visit (INDEPENDENT_AMBULATORY_CARE_PROVIDER_SITE_OTHER): Payer: Self-pay | Admitting: Podiatry

## 2016-06-21 ENCOUNTER — Encounter (INDEPENDENT_AMBULATORY_CARE_PROVIDER_SITE_OTHER): Payer: Medicare Other

## 2016-06-21 DIAGNOSIS — L97521 Non-pressure chronic ulcer of other part of left foot limited to breakdown of skin: Secondary | ICD-10-CM

## 2016-06-21 DIAGNOSIS — E1142 Type 2 diabetes mellitus with diabetic polyneuropathy: Secondary | ICD-10-CM | POA: Diagnosis not present

## 2016-06-26 ENCOUNTER — Encounter (INDEPENDENT_AMBULATORY_CARE_PROVIDER_SITE_OTHER): Payer: Self-pay

## 2016-09-22 ENCOUNTER — Ambulatory Visit
Admission: EM | Admit: 2016-09-22 | Discharge: 2016-09-22 | Disposition: A | Discharge status: Home / Self Care | Payer: Medicare Other | Attending: Internal Medicine | Admitting: Internal Medicine

## 2016-09-22 ENCOUNTER — Encounter: Payer: Self-pay | Admitting: Emergency Medicine

## 2016-09-22 ENCOUNTER — Ambulatory Visit (INDEPENDENT_AMBULATORY_CARE_PROVIDER_SITE_OTHER): Payer: Medicare Other

## 2016-09-22 DIAGNOSIS — J181 Lobar pneumonia, unspecified organism: Secondary | ICD-10-CM

## 2016-09-22 DIAGNOSIS — R739 Hyperglycemia, unspecified: Secondary | ICD-10-CM | POA: Diagnosis not present

## 2016-09-22 DIAGNOSIS — N3000 Acute cystitis without hematuria: Secondary | ICD-10-CM

## 2016-09-22 DIAGNOSIS — J189 Pneumonia, unspecified organism: Secondary | ICD-10-CM

## 2016-09-22 HISTORY — DX: Chronic obstructive pulmonary disease, unspecified: J44.9

## 2016-09-22 LAB — CBC WITH DIFFERENTIAL/PLATELET
Basophils Absolute: 0.1 10*3/uL (ref 0–0.1)
Basophils Relative: 1 %
Eosinophils Absolute: 0.1 10*3/uL (ref 0–0.7)
Eosinophils Relative: 1 %
HCT: 32.5 % — ABNORMAL LOW (ref 35.0–47.0)
Hemoglobin: 10.5 g/dL — ABNORMAL LOW (ref 12.0–16.0)
Lymphocytes Relative: 7 %
Lymphs Abs: 1.1 10*3/uL (ref 1.0–3.6)
MCH: 28.6 pg (ref 26.0–34.0)
MCHC: 32.2 g/dL (ref 32.0–36.0)
MCV: 88.9 fL (ref 80.0–100.0)
Monocytes Absolute: 1.5 10*3/uL — ABNORMAL HIGH (ref 0.2–0.9)
Monocytes Relative: 9 %
Neutro Abs: 12.9 10*3/uL — ABNORMAL HIGH (ref 1.4–6.5)
Neutrophils Relative %: 82 %
Platelets: 180 10*3/uL (ref 150–440)
RBC: 3.65 MIL/uL — ABNORMAL LOW (ref 3.80–5.20)
RDW: 16.9 % — ABNORMAL HIGH (ref 11.5–14.5)
WBC: 15.7 10*3/uL — ABNORMAL HIGH (ref 3.6–11.0)

## 2016-09-22 LAB — COMPREHENSIVE METABOLIC PANEL
ALT: 12 U/L — ABNORMAL LOW (ref 14–54)
AST: 19 U/L (ref 15–41)
Albumin: 3.6 g/dL (ref 3.5–5.0)
Alkaline Phosphatase: 76 U/L (ref 38–126)
Anion gap: 7 (ref 5–15)
BUN: 38 mg/dL — ABNORMAL HIGH (ref 6–20)
CO2: 28 mmol/L (ref 22–32)
Calcium: 8.7 mg/dL — ABNORMAL LOW (ref 8.9–10.3)
Chloride: 99 mmol/L — ABNORMAL LOW (ref 101–111)
Creatinine, Ser: 1.3 mg/dL — ABNORMAL HIGH (ref 0.44–1.00)
GFR calc Af Amer: 41 mL/min — ABNORMAL LOW (ref >60)
GFR calc non Af Amer: 35 mL/min — ABNORMAL LOW (ref >60)
Glucose, Bld: 241 mg/dL — ABNORMAL HIGH (ref 65–99)
Potassium: 4.7 mmol/L (ref 3.5–5.1)
Sodium: 134 mmol/L — ABNORMAL LOW (ref 135–145)
Total Bilirubin: 1.1 mg/dL (ref 0.3–1.2)
Total Protein: 7.4 g/dL (ref 6.5–8.1)

## 2016-09-22 LAB — URINALYSIS, COMPLETE (UACMP) WITH MICROSCOPIC
BILIRUBIN URINE: NEGATIVE
GLUCOSE, UA: NEGATIVE mg/dL
Ketones, ur: NEGATIVE mg/dL
NITRITE: NEGATIVE
PH: 5.5 (ref 5.0–8.0)
Protein, ur: 30 mg/dL — AB
SPECIFIC GRAVITY, URINE: 1.02 (ref 1.005–1.030)

## 2016-09-22 LAB — GLUCOSE, CAPILLARY: GLUCOSE-CAPILLARY: 245 mg/dL — AB (ref 65–99)

## 2016-09-22 MED ORDER — SULFAMETHOXAZOLE-TRIMETHOPRIM 800-160 MG PO TABS: 1.0000 | ORAL_TABLET | Freq: Two times a day (BID) | ORAL | 0 refills | Status: AC

## 2016-09-22 MED ORDER — ALBUTEROL SULFATE HFA 108 (90 BASE) MCG/ACT IN AERS: 1.0000 | INHALATION_SPRAY | Freq: Four times a day (QID) | RESPIRATORY_TRACT | 0 refills | Status: DC | PRN

## 2016-09-22 MED ORDER — INSULIN ASPART 100 UNIT/ML FLEXPEN: 5.0000 [IU] | PEN_INJECTOR | Freq: Every day | SUBCUTANEOUS | 11 refills | Status: DC

## 2016-09-22 NOTE — ED Provider Notes (Signed)
[facility]    CSN: [id number] Arrival date & time: 09/22/16  1402     History   Chief Complaint Chief Complaint  Patient presents with  . Weakness  . Fatigue  . Hyperglycemia    HPI [name] is a 83 y.o. female.   83 yo female w/ PMH of COPD and DM2 c/o sinus pressure and ear congestion for a few days.  She also admits to having no energy and just wanting to sleep.  Her sugar has been elevated as well.  Today it is 245.  She denies chest pain, SOB, N/V/D; however her daughter who is with her today states that she appears to fatigue easily.  She is also weak, stating that she can barely get up from her recliner.  The patient attributes all of this to inhaling dust when changing curtains and having carpet cleaned 2 says ago.      Past Medical History:  Diagnosis Date  . Cancer ([place])    skin ca  . CHF (congestive heart failure) ([place])   . Chronic back pain   . COPD (chronic obstructive pulmonary disease) ([place])   . Diabetes mellitus without complication ([place])   . Hypertension   . Poor perfusion of leg [facility])     Patient Active Problem List   Diagnosis Date Noted  . Ventral hernia without obstruction or gangrene   . Pneumonia 09/07/2015  . Hyponatremia 09/07/2015  . Acute respiratory failure ([place]) 09/01/2015  . Chronic diastolic heart failure ([place]) 04/10/2015  . Atrial fibrillation ([place]) 04/10/2015  . Essential hypertension 04/10/2015  . Diabetes ([place]) 04/10/2015  . Back pain 04/10/2015  . Acute on chronic systolic CHF (congestive heart failure) ([place]) 03/21/2015  . Pulmonary nodule 11/10/2014    Past Surgical History:  Procedure Laterality Date  . ABDOMINAL HYSTERECTOMY    . BACK SURGERY     x3  . CORONARY STENT PLACEMENT     unknown location per pt  . FOOT SURGERY    . LEG SURGERY    . PACEMAKER PLACEMENT    . TONSILLECTOMY      OB History    No data available       Home Medications    Prior to Admission medications     Medication Sig Start Date End Date Taking? Authorizing Provider  albuterol (PROVENTIL HFA;VENTOLIN HFA) 108 (90 Base) MCG/ACT inhaler Inhale 1-2 puffs into the lungs every 6 (six) hours as needed for wheezing or shortness of breath. 09/22/16   Harrie Foreman, MD  apixaban (ELIQUIS) 5 MG TABS tablet Take 5 mg by mouth 2 (two) times daily.    Historical Provider, MD  baclofen (LIORESAL) 10 MG tablet Take 10 mg by mouth 3 (three) times daily.    Historical Provider, MD  budesonide-formoterol (SYMBICORT) 160-4.5 MCG/ACT inhaler Inhale 2 puffs into the lungs 2 (two) times daily. 09/03/15   Gladstone Lighter, MD  carvedilol (COREG) 6.25 MG tablet Take 6.25 mg by mouth 2 (two) times daily with a meal.     Historical Provider, MD  cholecalciferol (VITAMIN D) 1000 units tablet Take 1,000 Units by mouth daily.    Historical Provider, MD  docusate sodium (COLACE) 100 MG capsule Take 100 mg by mouth 2 (two) times daily.     Historical Provider, MD  doxycycline (VIBRAMYCIN) 50 MG capsule Take 1 capsule (50 mg total) by mouth 2 (two) times daily. 10/17/15   Lorin Picket, PA-C  escitalopram (LEXAPRO) 10 MG tablet Take 10  mg by mouth daily.    Historical Provider, MD  fentaNYL (DURAGESIC - DOSED MCG/HR) 50 MCG/HR Place 50 mcg onto the skin every other day.     Historical Provider, MD  ferrous sulfate 325 (65 FE) MG tablet Take 325 mg by mouth daily with breakfast.    Historical Provider, MD  furosemide (LASIX) 40 MG tablet Take 40 mg by mouth every other day. Pt alternates with the 80mg  tablet.    Historical Provider, MD  furosemide (LASIX) 80 MG tablet Take 80 mg by mouth every other day. Pt alternates with the 40mg  tablet.    Historical Provider, MD  glimepiride (AMARYL) 4 MG tablet Take 4 mg by mouth 2 (two) times daily after a meal.    Historical Provider, MD  insulin aspart (NOVOLOG FLEXPEN) 100 UNIT/ML FlexPen Inject 5 Units into the skin daily before lunch. IF blood sugar is greater than 200 09/22/16    Harrie Foreman, MD  losartan (COZAAR) 25 MG tablet Take 25 mg by mouth daily.    Historical Provider, MD  magnesium oxide (MAG-OX) 400 MG tablet Take 400 mg by mouth daily.    Historical Provider, MD  Melatonin 1 MG TABS Take 1 mg by mouth at bedtime.    Historical Provider, MD  metolazone (ZAROXOLYN) 2.5 MG tablet Take 2.5 mg by mouth 2 (two) times a week. Pt takes on Monday and Friday.    Historical Provider, MD  mupirocin ointment (BACTROBAN) 2 % Apply 1 application topically 3 (three) times daily. 10/17/15   Lorin Picket, PA-C  oxyCODONE (OXY IR/ROXICODONE) 5 MG immediate release tablet Take 5-10 mg by mouth every 4 (four) hours as needed for severe pain.     Historical Provider, MD  pantoprazole (PROTONIX) 40 MG tablet Take 40 mg by mouth daily.    Historical Provider, MD  potassium chloride (KLOR-CON) 20 MEQ packet Take by mouth 2 (two) times daily.    Historical Provider, MD  predniSONE (STERAPRED UNI-PAK 21 TAB) 10 MG (21) TBPK tablet Take 1 tablet (10 mg total) by mouth daily. Start 60 mg po daily, taper 10 mg daily until done 09/13/15   Max Sane, MD  rOPINIRole (REQUIP) 0.5 MG tablet Take 0.5 mg by mouth 2 (two) times daily.    Historical Provider, MD  sitaGLIPtin (JANUVIA) 100 MG tablet Take 100 mg by mouth daily.    Historical Provider, MD  spironolactone (ALDACTONE) 25 MG tablet Take 25 mg by mouth daily.     Historical Provider, MD  sulfamethoxazole-trimethoprim (BACTRIM DS,SEPTRA DS) 800-160 MG tablet Take 1 tablet by mouth 2 (two) times daily. 09/22/16 09/29/16  Harrie Foreman, MD  vitamin B-12 (CYANOCOBALAMIN) 1000 MCG tablet Take 1,000 mcg by mouth daily.    Historical Provider, MD  vitamin C (ASCORBIC ACID) 500 MG tablet Take 500 mg by mouth daily.    Historical Provider, MD  Vitamin D, Ergocalciferol, (DRISDOL) 50000 UNITS CAPS capsule Take 50,000 Units by mouth every 7 (seven) days. Pt takes on Thursday.    Historical Provider, MD    Family History Family History    Problem Relation Age of Onset  . CVA Mother     Congestive Heart Failure, heart attack, hypertension, stroke  . Diabetes Mellitus II Sister   . Heart disease Father     Congestive Heart Failure, heart attack, hypertension    Social History Social History  Substance Use Topics  . Smoking status: Never Smoker  . Smokeless tobacco: Never Used  . Alcohol  use No     Allergies   Ceftriaxone; Cefuroxime; Cephalosporins; Codeine; Epinephrine; Morphine and related; Buprenorphine hcl; Ciprofloxacin; Latex; Lidocaine; and Procaine   Review of Systems Review of Systems  Constitutional: Positive for fatigue. Negative for chills and fever.  HENT: Negative for sore throat and tinnitus.   Eyes: Negative for redness.  Respiratory: Negative for cough and shortness of breath.   Cardiovascular: Negative for chest pain and palpitations.  Gastrointestinal: Negative for abdominal pain, diarrhea, nausea and vomiting.  Genitourinary: Negative for dysuria, frequency and urgency.  Musculoskeletal: Negative for myalgias.  Skin: Negative for rash.       No lesions  Neurological: Negative for weakness.  Hematological: Does not bruise/bleed easily.  Psychiatric/Behavioral: Negative for suicidal ideas.     Physical Exam Triage Vital Signs ED Triage Vitals  Enc Vitals Group     BP 09/22/16 1412 (!) 133/54     Pulse Rate 09/22/16 1412 73     Resp 09/22/16 1412 17     Temp 09/22/16 1412 98.4 F (36.9 C)     Temp Source 09/22/16 1412 Oral     SpO2 09/22/16 1412 93 %     Weight 09/22/16 1410 170 lb (77.1 kg)     Height --      Head Circumference --      Peak Flow --      Pain Score 09/22/16 1410 0     Pain Loc --      Pain Edu? --      Excl. in [place]? --    No data found.   Updated Vital Signs BP (!) 133/54 (BP Location: Left Arm)   Pulse 73   Temp 98.4 F (36.9 C) (Oral)   Resp 17   Wt 170 lb (77.1 kg)   SpO2 93%   BMI 26.63 kg/m   Visual Acuity Right Eye Distance:   Left Eye  Distance:   Bilateral Distance:    Right Eye Near:   Left Eye Near:    Bilateral Near:     Physical Exam  Constitutional: She is oriented to person, place, and time. She appears well-developed and well-nourished. No distress.  HENT:  Head: Normocephalic and atraumatic.  Mouth/Throat: Oropharynx is clear and moist.  Eyes: Conjunctivae and EOM are normal. Pupils are equal, round, and reactive to light. No scleral icterus.  Neck: Normal range of motion. Neck supple. No JVD present. No tracheal deviation present. No thyromegaly present.  Cardiovascular: Normal rate, regular rhythm and normal heart sounds.  Exam reveals no gallop and no friction rub.   No murmur heard. Pulmonary/Chest: Effort normal and breath sounds normal.  Abdominal: Soft. Bowel sounds are normal. She exhibits no distension. There is no tenderness.  Musculoskeletal: Normal range of motion. She exhibits no edema.  Lymphadenopathy:    She has no cervical adenopathy.  Neurological: She is alert and oriented to person, place, and time. No cranial nerve deficit.  Skin: Skin is warm and dry.  Psychiatric: She has a normal mood and affect. Her behavior is normal. Judgment and thought content normal.  Nursing note and vitals reviewed.    UC Treatments / Results  Labs (all labs ordered are listed, but only abnormal results are displayed) Labs Reviewed  URINALYSIS, COMPLETE (UACMP) WITH MICROSCOPIC - Abnormal; Notable for the following:       Result Value   Color, Urine AMBER (*)    APPearance CLOUDY (*)    Hgb urine dipstick TRACE (*)    Protein,  ur 30 (*)    Leukocytes, UA MODERATE (*)    Squamous Epithelial / LPF 6-30 (*)    Bacteria, UA FEW (*)    All other components within normal limits  GLUCOSE, CAPILLARY - Abnormal; Notable for the following:    Glucose-Capillary 245 (*)    All other components within normal limits  CBC WITH DIFFERENTIAL/PLATELET - Abnormal; Notable for the following:    WBC 15.7 (*)    RBC  3.65 (*)    Hemoglobin 10.5 (*)    HCT 32.5 (*)    RDW 16.9 (*)    Neutro Abs 12.9 (*)    Monocytes Absolute 1.5 (*)    All other components within normal limits  COMPREHENSIVE METABOLIC PANEL - Abnormal; Notable for the following:    Sodium 134 (*)    Chloride 99 (*)    Glucose, Bld 241 (*)    BUN 38 (*)    Creatinine, Ser 1.30 (*)    Calcium 8.7 (*)    ALT 12 (*)    GFR calc non Af Amer 35 (*)    GFR calc Af Amer 41 (*)    All other components within normal limits  CBG MONITORING, ED    EKG  EKG Interpretation None       Radiology Dg Chest 2 View  Result Date: 09/22/2016 CLINICAL DATA:  Pt has not been feeling well x 3 days with sob and lack of energy. Some sinus drainage. Denies cough. Hx pneumonia, and heart stents and pacemaker EXAM: CHEST  2 VIEW COMPARISON:  Chest CT, 03/15/2016.  Chest radiograph, 09/12/2015. FINDINGS: There is consolidation in the right lower lobe posterior to the oblique fissure. Lungs are hyperexpanded but otherwise clear. No pleural effusion. No pneumothorax. Cardiac silhouette is mildly enlarged. No mediastinal or hilar masses. No evidence of adenopathy. Left anterior chest wall sequential pacemaker is stable and well positioned. There stable changes from a thoracolumbar posterior fusion. IMPRESSION: 1. Right lower lobe pneumonia. Electronically Signed   By: Lajean Manes M.D.   On: 09/22/2016 15:00    Procedures Procedures (including critical care time)  Medications Ordered in UC Medications - No data to display   Initial Impression / Assessment and Plan / UC Course  I have reviewed the triage vital signs and the nursing notes.  Pertinent labs & imaging results that were available during my care of the patient were reviewed by me and considered in my medical decision making (see chart for details).     Elevated WBC but does not meet criteria for sepsis.  Likely UTI and pneumonia on CXR even though pt is not tachypneic or hypoxic. Rx  Bactrim.  BS elevated due to infection.  Rx low dose insulin for sugar >200 once per day.  Also no inhalers at home.  Rx albuterol.  Final Clinical Impressions(s) / UC Diagnoses   Final diagnoses:  Community acquired pneumonia of right lower lobe of lung ([place])  Acute cystitis without hematuria    New Prescriptions Discharge Medication List as of 09/22/2016  3:34 PM    START taking these medications   Details  albuterol (PROVENTIL HFA;VENTOLIN HFA) 108 (90 Base) MCG/ACT inhaler Inhale 1-2 puffs into the lungs every 6 (six) hours as needed for wheezing or shortness of breath., Starting Sun 09/22/2016, Print    insulin aspart (NOVOLOG FLEXPEN) 100 UNIT/ML FlexPen Inject 5 Units into the skin daily before lunch. IF blood sugar is greater than 200, Starting Sun 09/22/2016, Print  sulfamethoxazole-trimethoprim (BACTRIM DS,SEPTRA DS) 800-160 MG tablet Take 1 tablet by mouth 2 (two) times daily., Starting Sun 09/22/2016, Until Sun 09/29/2016, Print         Harrie Foreman, MD 09/22/16 [phone]

## 2016-09-22 NOTE — ED Triage Notes (Signed)
Patient c/o feeling tired, weak and elevated blood sugar for the past 2 days.  Patient reports some nasal congestion and sinus drainage.

## 2016-12-06 ENCOUNTER — Ambulatory Visit (INDEPENDENT_AMBULATORY_CARE_PROVIDER_SITE_OTHER): Payer: Medicare Other

## 2016-12-06 ENCOUNTER — Ambulatory Visit (INDEPENDENT_AMBULATORY_CARE_PROVIDER_SITE_OTHER)
Admission: EM | Admit: 2016-12-06 | Discharge: 2016-12-06 | Disposition: A | Discharge status: Home / Self Care | Payer: Medicare Other | Source: Home / Self Care | Attending: Emergency Medicine | Admitting: Emergency Medicine

## 2016-12-06 ENCOUNTER — Encounter: Payer: Self-pay | Admitting: *Deleted

## 2016-12-06 DIAGNOSIS — R350 Frequency of micturition: Secondary | ICD-10-CM

## 2016-12-06 DIAGNOSIS — L8990 Pressure ulcer of unspecified site, unspecified stage: Secondary | ICD-10-CM | POA: Diagnosis present

## 2016-12-06 DIAGNOSIS — I130 Hypertensive heart and chronic kidney disease with heart failure and stage 1 through stage 4 chronic kidney disease, or unspecified chronic kidney disease: Principal | ICD-10-CM | POA: Diagnosis present

## 2016-12-06 DIAGNOSIS — Z888 Allergy status to other drugs, medicaments and biological substances status: Secondary | ICD-10-CM

## 2016-12-06 DIAGNOSIS — I509 Heart failure, unspecified: Secondary | ICD-10-CM | POA: Insufficient documentation

## 2016-12-06 DIAGNOSIS — Z794 Long term (current) use of insulin: Secondary | ICD-10-CM

## 2016-12-06 DIAGNOSIS — Z881 Allergy status to other antibiotic agents status: Secondary | ICD-10-CM

## 2016-12-06 DIAGNOSIS — B9620 Unspecified Escherichia coli [E. coli] as the cause of diseases classified elsewhere: Secondary | ICD-10-CM | POA: Diagnosis present

## 2016-12-06 DIAGNOSIS — G8929 Other chronic pain: Secondary | ICD-10-CM | POA: Insufficient documentation

## 2016-12-06 DIAGNOSIS — J9621 Acute and chronic respiratory failure with hypoxia: Secondary | ICD-10-CM | POA: Diagnosis not present

## 2016-12-06 DIAGNOSIS — N3000 Acute cystitis without hematuria: Secondary | ICD-10-CM | POA: Diagnosis present

## 2016-12-06 DIAGNOSIS — Z885 Allergy status to narcotic agent status: Secondary | ICD-10-CM

## 2016-12-06 DIAGNOSIS — Z955 Presence of coronary angioplasty implant and graft: Secondary | ICD-10-CM

## 2016-12-06 DIAGNOSIS — Z90710 Acquired absence of both cervix and uterus: Secondary | ICD-10-CM | POA: Insufficient documentation

## 2016-12-06 DIAGNOSIS — Z79899 Other long term (current) drug therapy: Secondary | ICD-10-CM | POA: Insufficient documentation

## 2016-12-06 DIAGNOSIS — Z7901 Long term (current) use of anticoagulants: Secondary | ICD-10-CM

## 2016-12-06 DIAGNOSIS — Z91040 Latex allergy status: Secondary | ICD-10-CM | POA: Diagnosis not present

## 2016-12-06 DIAGNOSIS — I5043 Acute on chronic combined systolic (congestive) and diastolic (congestive) heart failure: Secondary | ICD-10-CM | POA: Diagnosis present

## 2016-12-06 DIAGNOSIS — Z9981 Dependence on supplemental oxygen: Secondary | ICD-10-CM | POA: Diagnosis not present

## 2016-12-06 DIAGNOSIS — N3001 Acute cystitis with hematuria: Secondary | ICD-10-CM

## 2016-12-06 DIAGNOSIS — I110 Hypertensive heart disease with heart failure: Secondary | ICD-10-CM

## 2016-12-06 DIAGNOSIS — Z823 Family history of stroke: Secondary | ICD-10-CM

## 2016-12-06 DIAGNOSIS — J449 Chronic obstructive pulmonary disease, unspecified: Secondary | ICD-10-CM | POA: Diagnosis present

## 2016-12-06 DIAGNOSIS — R531 Weakness: Secondary | ICD-10-CM

## 2016-12-06 DIAGNOSIS — I482 Chronic atrial fibrillation: Secondary | ICD-10-CM | POA: Diagnosis not present

## 2016-12-06 DIAGNOSIS — E1122 Type 2 diabetes mellitus with diabetic chronic kidney disease: Secondary | ICD-10-CM | POA: Diagnosis not present

## 2016-12-06 DIAGNOSIS — M549 Dorsalgia, unspecified: Secondary | ICD-10-CM | POA: Diagnosis not present

## 2016-12-06 DIAGNOSIS — Z950 Presence of cardiac pacemaker: Secondary | ICD-10-CM | POA: Insufficient documentation

## 2016-12-06 DIAGNOSIS — N189 Chronic kidney disease, unspecified: Secondary | ICD-10-CM | POA: Diagnosis present

## 2016-12-06 DIAGNOSIS — E119 Type 2 diabetes mellitus without complications: Secondary | ICD-10-CM | POA: Insufficient documentation

## 2016-12-06 LAB — CBC WITH DIFFERENTIAL/PLATELET
Basophils Absolute: 0.1 10*3/uL (ref 0–0.1)
Basophils Relative: 1 %
Eosinophils Absolute: 0.2 10*3/uL (ref 0–0.7)
Eosinophils Relative: 2 %
HCT: 32.8 % — ABNORMAL LOW (ref 35.0–47.0)
Hemoglobin: 10.7 g/dL — ABNORMAL LOW (ref 12.0–16.0)
Lymphocytes Relative: 9 %
Lymphs Abs: 0.8 10*3/uL — ABNORMAL LOW (ref 1.0–3.6)
MCH: 29.8 pg (ref 26.0–34.0)
MCHC: 32.6 g/dL (ref 32.0–36.0)
MCV: 91.4 fL (ref 80.0–100.0)
Monocytes Absolute: 1 10*3/uL — ABNORMAL HIGH (ref 0.2–0.9)
Monocytes Relative: 12 %
Neutro Abs: 6.9 10*3/uL — ABNORMAL HIGH (ref 1.4–6.5)
Neutrophils Relative %: 76 %
Platelets: 126 10*3/uL — ABNORMAL LOW (ref 150–440)
RBC: 3.59 MIL/uL — ABNORMAL LOW (ref 3.80–5.20)
RDW: 16.4 % — ABNORMAL HIGH (ref 11.5–14.5)
WBC: 9 10*3/uL (ref 3.6–11.0)

## 2016-12-06 LAB — COMPREHENSIVE METABOLIC PANEL
ALT: 11 U/L — ABNORMAL LOW (ref 14–54)
AST: 17 U/L (ref 15–41)
Albumin: 3.7 g/dL (ref 3.5–5.0)
Alkaline Phosphatase: 77 U/L (ref 38–126)
Anion gap: 7 (ref 5–15)
BUN: 39 mg/dL — ABNORMAL HIGH (ref 6–20)
CO2: 31 mmol/L (ref 22–32)
Calcium: 9.1 mg/dL (ref 8.9–10.3)
Chloride: 101 mmol/L (ref 101–111)
Creatinine, Ser: 1.12 mg/dL — ABNORMAL HIGH (ref 0.44–1.00)
GFR calc Af Amer: 49 mL/min — ABNORMAL LOW (ref >60)
GFR calc non Af Amer: 42 mL/min — ABNORMAL LOW (ref >60)
Glucose, Bld: 182 mg/dL — ABNORMAL HIGH (ref 65–99)
Potassium: 4.7 mmol/L (ref 3.5–5.1)
Sodium: 139 mmol/L (ref 135–145)
Total Bilirubin: 0.8 mg/dL (ref 0.3–1.2)
Total Protein: 7.2 g/dL (ref 6.5–8.1)

## 2016-12-06 LAB — URINALYSIS, COMPLETE (UACMP) WITH MICROSCOPIC
Bilirubin Urine: NEGATIVE
GLUCOSE, UA: NEGATIVE mg/dL
Ketones, ur: NEGATIVE mg/dL
Nitrite: NEGATIVE
PROTEIN: 30 mg/dL — AB
SPECIFIC GRAVITY, URINE: 1.015 (ref 1.005–1.030)
SQUAMOUS EPITHELIAL / LPF: NONE SEEN
pH: 6 (ref 5.0–8.0)

## 2016-12-06 LAB — GLUCOSE, CAPILLARY: GLUCOSE-CAPILLARY: 174 mg/dL — AB (ref 65–99)

## 2016-12-06 MED ORDER — SULFAMETHOXAZOLE-TRIMETHOPRIM 800-160 MG PO TABS: 1.0000 | ORAL_TABLET | Freq: Two times a day (BID) | ORAL | 0 refills | Status: DC

## 2016-12-06 NOTE — ED Provider Notes (Signed)
CSN: [id number]     Arrival date & time 12/06/16  1500 History   First MD Initiated Contact with Patient 12/06/16 1536     Chief Complaint  Patient presents with  . Urinary Frequency   (Consider location/radiation/quality/duration/timing/severity/associated sxs/prior Treatment) HPI  Is an 83 year old female who started having weakness 3 days ago and urinary frequency 2 days ago. Her husband who accompanies her states that she has not felt normal  during that period of time. He has noticed that she seemed to be breathing rapidly and shallowly. O2 sats are 95% on room air she is afebrile. Pulse rate of 72 she reports a blood sugar in the 300s last night before bedtime and 201 at 2:00 today. Her blood sugar (CBG) was 174       Past Medical History:  Diagnosis Date  . Cancer ([place])    skin ca  . CHF (congestive heart failure) ([place])   . Chronic back pain   . COPD (chronic obstructive pulmonary disease) ([place])   . Diabetes mellitus without complication ([place])   . Hypertension   . Poor perfusion of leg    Past Surgical History:  Procedure Laterality Date  . ABDOMINAL HYSTERECTOMY    . BACK SURGERY     x3  . CORONARY STENT PLACEMENT     unknown location per pt  . FOOT SURGERY    . LEG SURGERY    . PACEMAKER PLACEMENT    . TONSILLECTOMY     Family History  Problem Relation Age of Onset  . CVA Mother        Congestive Heart Failure, heart attack, hypertension, stroke  . Diabetes Mellitus II Sister   . Heart disease Father        Congestive Heart Failure, heart attack, hypertension   Social History  Substance Use Topics  . Smoking status: Never Smoker  . Smokeless tobacco: Never Used  . Alcohol use No   OB History    No data available     Review of Systems  Constitutional: Positive for activity change, chills and fatigue. Negative for fever.  Genitourinary: Positive for dysuria and frequency.  All other systems reviewed and are negative.   Allergies  Ceftriaxone;  Cefuroxime; Cephalosporins; Codeine; Epinephrine; Morphine and related; Buprenorphine hcl; Ciprofloxacin; Latex; Lidocaine; and Procaine  Home Medications   Prior to Admission medications   Medication Sig Start Date End Date Taking? Authorizing Provider  albuterol (PROVENTIL HFA;VENTOLIN HFA) 108 (90 Base) MCG/ACT inhaler Inhale 1-2 puffs into the lungs every 6 (six) hours as needed for wheezing or shortness of breath. 09/22/16   Harrie Foreman, MD  apixaban (ELIQUIS) 5 MG TABS tablet Take 5 mg by mouth 2 (two) times daily.    [provider]  baclofen (LIORESAL) 10 MG tablet Take 10 mg by mouth 3 (three) times daily.    [provider]  budesonide-formoterol (SYMBICORT) 160-4.5 MCG/ACT inhaler Inhale 2 puffs into the lungs 2 (two) times daily. 09/03/15   Gladstone Lighter, MD  carvedilol (COREG) 6.25 MG tablet Take 6.25 mg by mouth 2 (two) times daily with a meal.     [provider]  cholecalciferol (VITAMIN D) 1000 units tablet Take 1,000 Units by mouth daily.    [provider]  docusate sodium (COLACE) 100 MG capsule Take 100 mg by mouth 2 (two) times daily.     [provider]  doxycycline (VIBRAMYCIN) 50 MG capsule Take 1 capsule (50 mg total) by mouth 2 (two) times daily.  10/17/15   Lorin Picket, PA-C  escitalopram (LEXAPRO) 10 MG tablet Take 10 mg by mouth daily.    [provider]  fentaNYL (DURAGESIC - DOSED MCG/HR) 50 MCG/HR Place 50 mcg onto the skin every other day.     [provider]  ferrous sulfate 325 (65 FE) MG tablet Take 325 mg by mouth daily with breakfast.    [provider]  furosemide (LASIX) 40 MG tablet Take 40 mg by mouth every other day. Pt alternates with the 80mg  tablet.    [provider]  furosemide (LASIX) 80 MG tablet Take 80 mg by mouth every other day. Pt alternates with the 40mg  tablet.    [provider]  glimepiride (AMARYL) 4 MG tablet Take 4 mg by mouth 2 (two)  times daily after a meal.    [provider]  insulin aspart (NOVOLOG FLEXPEN) 100 UNIT/ML FlexPen Inject 5 Units into the skin daily before lunch. IF blood sugar is greater than 200 09/22/16   Harrie Foreman, MD  losartan (COZAAR) 25 MG tablet Take 25 mg by mouth daily.    [provider]  magnesium oxide (MAG-OX) 400 MG tablet Take 400 mg by mouth daily.    [provider]  Melatonin 1 MG TABS Take 1 mg by mouth at bedtime.    [provider]  metolazone (ZAROXOLYN) 2.5 MG tablet Take 2.5 mg by mouth 2 (two) times a week. Pt takes on Monday and Friday.    [provider]  mupirocin ointment (BACTROBAN) 2 % Apply 1 application topically 3 (three) times daily. 10/17/15   Lorin Picket, PA-C  oxyCODONE (OXY IR/ROXICODONE) 5 MG immediate release tablet Take 5-10 mg by mouth every 4 (four) hours as needed for severe pain.     [provider]  pantoprazole (PROTONIX) 40 MG tablet Take 40 mg by mouth daily.    [provider]  potassium chloride (KLOR-CON) 20 MEQ packet Take by mouth 2 (two) times daily.    [provider]  predniSONE (STERAPRED UNI-PAK 21 TAB) 10 MG (21) TBPK tablet Take 1 tablet (10 mg total) by mouth daily. Start 60 mg po daily, taper 10 mg daily until done 09/13/15   Max Sane, MD  rOPINIRole (REQUIP) 0.5 MG tablet Take 0.5 mg by mouth 2 (two) times daily.    [provider]  sitaGLIPtin (JANUVIA) 100 MG tablet Take 100 mg by mouth daily.    [provider]  spironolactone (ALDACTONE) 25 MG tablet Take 25 mg by mouth daily.     [provider]  sulfamethoxazole-trimethoprim (BACTRIM DS,SEPTRA DS) 800-160 MG tablet Take 1 tablet by mouth 2 (two) times daily. 12/06/16   Lorin Picket, PA-C  vitamin B-12 (CYANOCOBALAMIN) 1000 MCG tablet Take 1,000 mcg by mouth daily.    [provider]  vitamin C (ASCORBIC ACID) 500 MG tablet Take 500 mg by mouth daily.    [provider]  Vitamin D, Ergocalciferol, (DRISDOL) 50000 UNITS CAPS capsule Take 50,000 Units by mouth every 7 (seven) days. Pt takes on Thursday.    [provider]   Meds Ordered and Administered this Visit  Medications - No data to display  BP (!) 146/64 (BP Location: Left Arm)   Pulse 72   Temp (!) 96.3 F (35.7 C) (Oral)   Resp 16   Ht 5\' 7"  (1.702 m)   Wt 175 lb (79.4 kg)   SpO2 95%   BMI 27.41  kg/m  No data found.   Physical Exam  Constitutional: She is oriented to person, place, and time. She appears well-developed and well-nourished. No distress.  HENT:  Head: Normocephalic.  Eyes: Pupils are equal, round, and reactive to light.  Neck: Normal range of motion. Neck supple.  Cardiovascular: Normal rate, regular rhythm and normal heart sounds.  Exam reveals no gallop and no friction rub.   No murmur heard. Pulmonary/Chest: Effort normal. She has rales.  Musculoskeletal: Normal range of motion.  Lymphadenopathy:    She has no cervical adenopathy.  Neurological: She is alert and oriented to person, place, and time.  Patient is lethargic. She arouses easily.  Skin: Skin is warm and dry. She is not diaphoretic.  Psychiatric: She has a normal mood and affect. Her behavior is normal. Judgment and thought content normal.  Nursing note and vitals reviewed.   Urgent Care Course     Procedures (including critical care time)  Labs Review Labs Reviewed  URINALYSIS, COMPLETE (UACMP) WITH MICROSCOPIC - Abnormal; Notable for the following:       Result Value   Color, Urine STRAW (*)    APPearance CLOUDY (*)    Hgb urine dipstick MODERATE (*)    Protein, ur 30 (*)    Leukocytes, UA LARGE (*)    Bacteria, UA MANY (*)    All other components within normal limits  GLUCOSE, CAPILLARY - Abnormal; Notable for the following:    Glucose-Capillary 174 (*)    All other components within normal limits  CBC WITH DIFFERENTIAL/PLATELET - Abnormal; Notable for the  following:    RBC 3.59 (*)    Hemoglobin 10.7 (*)    HCT 32.8 (*)    RDW 16.4 (*)    Platelets 126 (*)    Neutro Abs 6.9 (*)    Lymphs Abs 0.8 (*)    Monocytes Absolute 1.0 (*)    All other components within normal limits  COMPREHENSIVE METABOLIC PANEL - Abnormal; Notable for the following:    Glucose, Bld 182 (*)    BUN 39 (*)    Creatinine, Ser 1.12 (*)    ALT 11 (*)    GFR calc non Af Amer 42 (*)    GFR calc Af Amer 49 (*)    All other components within normal limits  URINE CULTURE    Imaging Review Dg Chest 2 View  Result Date: 12/06/2016 CLINICAL DATA:  Coronary artery stent.  Urinary frequency. EXAM: CHEST  2 VIEW COMPARISON:  09/22/2016 FINDINGS: Dual lead cardiac pacemaker, stable. Cardiac stent present. Posterior spinal fusion of the thoraco lumbar spine with stable anterior compression deformity of T11 vertebral body. The cardiac silhouette is enlarged. Mediastinal contours appear intact. Calcific atherosclerotic disease and tortuosity of the aorta appear There is no evidence of focal airspace consolidation, pleural effusion or pneumothorax. Osseous structures are without acute abnormality. Soft tissues are grossly normal. IMPRESSION: Enlarged cardiac silhouette. No evidence of focal consolidation or pulmonary edema. Electronically Signed   By: Fidela Salisbury M.D.   On: 12/06/2016 16:39     Visual Acuity Review  Right Eye Distance:   Left Eye Distance:   Bilateral Distance:    Right Eye Near:   Left Eye Near:    Bilateral Near:         MDM   1. Acute cystitis with hematuria    New Prescriptions   SULFAMETHOXAZOLE-TRIMETHOPRIM (BACTRIM DS,SEPTRA DS) 800-160 MG TABLET    Take 1 tablet by mouth 2 (two) times  daily.  Plan: 1. Test/x-ray results and diagnosis reviewed with patient 2. rx as per orders; risks, benefits, potential side effects reviewed with patient 3. Recommend supportive treatment with Rest, fluids. Use Tylenol for discomfort. I have told  the husband that if there is any change whatsoever in her condition eg. if she seems to be worsening despite use of the Septra or becoming more sleepy etc. that they go immediately to the emergency department. Urine culture should be available in 48 hours. Currently she is not septic. Is allergic to almost every antibiotic ;Septra this point time is her best option. Decide other options if necessary at the time of the cultures and sensitivity results 4. F/u prn if symptoms worsen or don't improve     Lorin Picket, PA-C 12/06/16 1721

## 2016-12-06 NOTE — ED Triage Notes (Signed)
Patient started having urinary frequency 2 days ago and weakness 3 days ago. Patient also reports a blood sugar level of 201 at 1400 today.

## 2016-12-07 ENCOUNTER — Inpatient Hospital Stay
Admission: EM | Admit: 2016-12-07 | Discharge: 2016-12-09 | DRG: 291 | Disposition: A | Discharge status: Home / Self Care | Payer: Medicare Other | Attending: Internal Medicine | Admitting: Internal Medicine

## 2016-12-07 ENCOUNTER — Emergency Department: Payer: Medicare Other

## 2016-12-07 DIAGNOSIS — I509 Heart failure, unspecified: Secondary | ICD-10-CM

## 2016-12-07 DIAGNOSIS — N390 Urinary tract infection, site not specified: Secondary | ICD-10-CM | POA: Diagnosis present

## 2016-12-07 DIAGNOSIS — I130 Hypertensive heart and chronic kidney disease with heart failure and stage 1 through stage 4 chronic kidney disease, or unspecified chronic kidney disease: Secondary | ICD-10-CM | POA: Diagnosis not present

## 2016-12-07 DIAGNOSIS — E119 Type 2 diabetes mellitus without complications: Secondary | ICD-10-CM

## 2016-12-07 DIAGNOSIS — I10 Essential (primary) hypertension: Secondary | ICD-10-CM | POA: Diagnosis present

## 2016-12-07 DIAGNOSIS — N3000 Acute cystitis without hematuria: Secondary | ICD-10-CM

## 2016-12-07 DIAGNOSIS — I5043 Acute on chronic combined systolic (congestive) and diastolic (congestive) heart failure: Secondary | ICD-10-CM | POA: Diagnosis present

## 2016-12-07 DIAGNOSIS — J9601 Acute respiratory failure with hypoxia: Secondary | ICD-10-CM

## 2016-12-07 DIAGNOSIS — J449 Chronic obstructive pulmonary disease, unspecified: Secondary | ICD-10-CM | POA: Diagnosis present

## 2016-12-07 DIAGNOSIS — I4891 Unspecified atrial fibrillation: Secondary | ICD-10-CM | POA: Diagnosis present

## 2016-12-07 LAB — URINALYSIS, COMPLETE (UACMP) WITH MICROSCOPIC
Bilirubin Urine: NEGATIVE
Glucose, UA: NEGATIVE mg/dL
KETONES UR: NEGATIVE mg/dL
Nitrite: NEGATIVE
PROTEIN: NEGATIVE mg/dL
Specific Gravity, Urine: 1.006 (ref 1.005–1.030)
pH: 6 (ref 5.0–8.0)

## 2016-12-07 LAB — CBC
HEMATOCRIT: 32.4 % — AB (ref 35.0–47.0)
HEMOGLOBIN: 10.6 g/dL — AB (ref 12.0–16.0)
MCH: 29.3 pg (ref 26.0–34.0)
MCHC: 32.7 g/dL (ref 32.0–36.0)
MCV: 89.5 fL (ref 80.0–100.0)
Platelets: 118 10*3/uL — ABNORMAL LOW (ref 150–440)
RBC: 3.62 MIL/uL — ABNORMAL LOW (ref 3.80–5.20)
RDW: 16.3 % — ABNORMAL HIGH (ref 11.5–14.5)
WBC: 12.6 10*3/uL — ABNORMAL HIGH (ref 3.6–11.0)

## 2016-12-07 LAB — COMPREHENSIVE METABOLIC PANEL
ALBUMIN: 3.6 g/dL (ref 3.5–5.0)
ALT: 16 U/L (ref 14–54)
ANION GAP: 10 (ref 5–15)
AST: 25 U/L (ref 15–41)
Alkaline Phosphatase: 70 U/L (ref 38–126)
BUN: 37 mg/dL — ABNORMAL HIGH (ref 6–20)
CO2: 23 mmol/L (ref 22–32)
Calcium: 8.6 mg/dL — ABNORMAL LOW (ref 8.9–10.3)
Chloride: 101 mmol/L (ref 101–111)
Creatinine, Ser: 1.29 mg/dL — ABNORMAL HIGH (ref 0.44–1.00)
GFR calc Af Amer: 41 mL/min — ABNORMAL LOW (ref >60)
GFR calc non Af Amer: 36 mL/min — ABNORMAL LOW (ref >60)
GLUCOSE: 267 mg/dL — AB (ref 65–99)
POTASSIUM: 4.9 mmol/L (ref 3.5–5.1)
SODIUM: 134 mmol/L — AB (ref 135–145)
Total Bilirubin: 1.2 mg/dL (ref 0.3–1.2)
Total Protein: 6.7 g/dL (ref 6.5–8.1)

## 2016-12-07 LAB — BRAIN NATRIURETIC PEPTIDE: B Natriuretic Peptide: 537 pg/mL — ABNORMAL HIGH (ref 0.0–100.0)

## 2016-12-07 LAB — LACTIC ACID, PLASMA: Lactic Acid, Venous: 1.5 mmol/L (ref 0.5–1.9)

## 2016-12-07 LAB — TROPONIN I: Troponin I: 0.03 ng/mL (ref <0.03)

## 2016-12-07 MED ORDER — IPRATROPIUM-ALBUTEROL 0.5-2.5 (3) MG/3ML IN SOLN
3.0000 mL | Freq: Once | RESPIRATORY_TRACT | Status: AC
  Administered 2016-12-07: 3 mL via RESPIRATORY_TRACT
  Filled 2016-12-07: qty 3

## 2016-12-07 MED ORDER — PIPERACILLIN-TAZOBACTAM 3.375 G IVPB 30 MIN
3.3750 g | Freq: Once | INTRAVENOUS | Status: AC
  Administered 2016-12-07: 3.375 g via INTRAVENOUS
  Filled 2016-12-07: qty 50

## 2016-12-07 MED ORDER — FUROSEMIDE 10 MG/ML IJ SOLN
40.0000 mg | Freq: Once | INTRAMUSCULAR | Status: AC
  Administered 2016-12-07: 40 mg via INTRAVENOUS
  Filled 2016-12-07: qty 4

## 2016-12-07 MED ORDER — OXYCODONE HCL 5 MG PO TABS
5.0000 mg | ORAL_TABLET | Freq: Once | ORAL | Status: AC
  Administered 2016-12-07: 5 mg via ORAL
  Filled 2016-12-07: qty 1

## 2016-12-07 NOTE — ED Triage Notes (Signed)
Pt arrived via ems for c/o UTI - pt states she started with symptoms of UTI 3 days ago - pt went to [facility] urgent care yesterday and was dx with UTI - she states that today she feels worse and is unable to walk and feels confused/disoriented  - pt states the last time she was sick like this she had pneumonia - lungs sounds are diminished with wheezing noted - O2 sat is 84% on RA - pt placed on O2 3L via n/c and sat'ing at 96%

## 2016-12-07 NOTE — ED Notes (Signed)
Dr Alfred Levins informed of troponin of 0.03 - no new orders at this time

## 2016-12-07 NOTE — ED Notes (Signed)
Pt arrived via ems for c/o UTI - pt states she started with symptoms of UTI 3 days ago - pt went to [facility] urgent care yesterday and was dx with UTI - she states that today she feels worse and is unable to walk and feels confused/disoriented  - pt states the last time she was sick like this she had pneumonia - lungs sounds are diminished with wheezing noted - O2 sat is 84% on RA - pt placed on O2 3L via n/c and sat'ing at 96%

## 2016-12-07 NOTE — ED Notes (Signed)
Patient transported to X-ray 

## 2016-12-07 NOTE — H&P (Signed)
[facility] [name]    MR#:  [id number]  DATE OF BIRTH:  1924/08/29  DATE OF ADMISSION:  12/07/2016  PRIMARY CARE PHYSICIAN: Tracie Harrier, MD   REQUESTING/REFERRING PHYSICIAN: Alfred Levins, MD  CHIEF COMPLAINT:   Chief Complaint  Patient presents with  . Altered Mental Status  . Urinary Tract Infection    HISTORY OF PRESENT ILLNESS:  [name]  is a 83 y.o. female who presents with Malaise, increased shortness of breath. Patient was recently started on antibiotics for urinary tract infection, she then developed progressive shortness of breath along with general malaise. This progressed over the past couple of days. Today she came to the ED for evaluation. She was found to still have a UTI here, and seems to be in heart failure exacerbation. Hospitalists were called for admission  PAST MEDICAL HISTORY:   Past Medical History:  Diagnosis Date  . Cancer ([place])    skin ca  . CHF (congestive heart failure) ([place])   . Chronic back pain   . COPD (chronic obstructive pulmonary disease) ([place])   . Diabetes mellitus without complication ([place])   . Hypertension   . Poor perfusion of leg     PAST SURGICAL HISTORY:   Past Surgical History:  Procedure Laterality Date  . ABDOMINAL HYSTERECTOMY    . BACK SURGERY     x3  . CORONARY STENT PLACEMENT     unknown location per pt  . FOOT SURGERY    . LEG SURGERY    . PACEMAKER PLACEMENT    . TONSILLECTOMY      SOCIAL HISTORY:   Social History  Substance Use Topics  . Smoking status: Never Smoker  . Smokeless tobacco: Never Used  . Alcohol use No    FAMILY HISTORY:   Family History  Problem Relation Age of Onset  . CVA Mother        Congestive Heart Failure, heart attack, hypertension, stroke  . Diabetes Mellitus II Sister   . Heart disease Father        Congestive Heart Failure, heart attack, hypertension    DRUG ALLERGIES:   Allergies  Allergen  Reactions  . Ceftriaxone Swelling  . Cefuroxime Hives  . Cephalosporins Swelling and Other (See Comments)    Reaction:  Fainting/dry mouth   . Codeine Nausea And Vomiting  . Epinephrine Other (See Comments)    Reaction:  Fainting   . Morphine And Related Other (See Comments)    Reaction:  GI upset   . Buprenorphine Hcl Nausea And Vomiting  . Ciprofloxacin Itching, Swelling and Rash  . Latex Rash  . Lidocaine Rash  . Procaine Rash    MEDICATIONS AT HOME:   Prior to Admission medications   Medication Sig Start Date End Date Taking? Authorizing Provider  albuterol (PROVENTIL HFA;VENTOLIN HFA) 108 (90 Base) MCG/ACT inhaler Inhale 1-2 puffs into the lungs every 6 (six) hours as needed for wheezing or shortness of breath. 09/22/16  Yes Harrie Foreman, MD  apixaban (ELIQUIS) 5 MG TABS tablet Take 5 mg by mouth 2 (two) times daily.   Yes [provider]  baclofen (LIORESAL) 10 MG tablet Take 10 mg by mouth 3 (three) times daily.   Yes [provider]  budesonide-formoterol (SYMBICORT) 160-4.5 MCG/ACT inhaler Inhale 2 puffs into the lungs 2 (two) times daily. 09/03/15  Yes Gladstone Lighter, MD  carvedilol (COREG) 6.25 MG tablet Take 6.25 mg by mouth 2 (two)  times daily with a meal.    Yes [provider]  cholecalciferol (VITAMIN D) 1000 units tablet Take 1,000 Units by mouth daily.   Yes [provider]  docusate sodium (COLACE) 100 MG capsule Take 100 mg by mouth 2 (two) times daily.    Yes [provider]  escitalopram (LEXAPRO) 10 MG tablet Take 10 mg by mouth daily.   Yes [provider]  fentaNYL (DURAGESIC - DOSED MCG/HR) 50 MCG/HR Place 50 mcg onto the skin every other day.    Yes [provider]  ferrous sulfate 325 (65 FE) MG tablet Take 325 mg by mouth daily with breakfast.   Yes [provider]  furosemide (LASIX) 40 MG tablet Take 40 mg by mouth every other day. Pt alternates with the 80mg  tablet.   Yes  [provider]  furosemide (LASIX) 80 MG tablet Take 80 mg by mouth every other day. Pt alternates with the 40mg  tablet.   Yes [provider]  glimepiride (AMARYL) 4 MG tablet Take 4 mg by mouth 2 (two) times daily after a meal.   Yes [provider]  insulin aspart (NOVOLOG FLEXPEN) 100 UNIT/ML FlexPen Inject 5 Units into the skin daily before lunch. IF blood sugar is greater than 200 09/22/16  Yes Harrie Foreman, MD  losartan (COZAAR) 25 MG tablet Take 25 mg by mouth daily.   Yes [provider]  magnesium oxide (MAG-OX) 400 MG tablet Take 400 mg by mouth daily.   Yes [provider]  Melatonin 1 MG TABS Take 1 mg by mouth at bedtime.   Yes [provider]  metolazone (ZAROXOLYN) 2.5 MG tablet Take 2.5 mg by mouth 2 (two) times a week. Pt takes on Monday and Friday.   Yes [provider]  oxyCODONE (OXY IR/ROXICODONE) 5 MG immediate release tablet Take 5-10 mg by mouth every 4 (four) hours as needed for severe pain.    Yes [provider]  pantoprazole (PROTONIX) 40 MG tablet Take 40 mg by mouth daily.   Yes [provider]  potassium chloride (KLOR-CON) 20 MEQ packet Take by mouth 2 (two) times daily.   Yes [provider]  rOPINIRole (REQUIP) 0.5 MG tablet Take 0.5 mg by mouth 2 (two) times daily.   Yes [provider]  sitaGLIPtin (JANUVIA) 100 MG tablet Take 100 mg by mouth daily.   Yes [provider]  spironolactone (ALDACTONE) 25 MG tablet Take 25 mg by mouth daily.    Yes [provider]  sulfamethoxazole-trimethoprim (BACTRIM DS,SEPTRA DS) 800-160 MG tablet Take 1 tablet by mouth 2 (two) times daily. 12/06/16  Yes Lorin Picket, PA-C  vitamin B-12 (CYANOCOBALAMIN) 1000 MCG tablet Take 1,000 mcg by mouth daily.   Yes [provider]  vitamin C (ASCORBIC ACID) 500 MG tablet Take 500 mg by mouth daily.   Yes [provider]  doxycycline (VIBRAMYCIN) 50  MG capsule Take 1 capsule (50 mg total) by mouth 2 (two) times daily. Patient not taking: Reported on 12/07/2016 10/17/15   Lorin Picket, PA-C  mupirocin ointment (BACTROBAN) 2 % Apply 1 application topically 3 (three) times daily. Patient not taking: Reported on 12/07/2016 10/17/15   Lorin Picket, PA-C  predniSONE (STERAPRED UNI-PAK 21 TAB) 10 MG (21) TBPK tablet Take 1 tablet (10 mg total) by mouth daily. Start 60 mg po daily, taper 10 mg daily until done Patient not taking: Reported on 12/07/2016 09/13/15   Max Sane, MD  Vitamin D, Ergocalciferol, (DRISDOL) 50000 UNITS CAPS capsule Take 50,000 Units by mouth every 7 (seven) days. Pt takes on Thursday.    [provider]    REVIEW OF SYSTEMS:  Review of Systems  Constitutional: Positive for malaise/fatigue. Negative for chills, fever and weight loss.  HENT: Negative for ear pain, hearing loss and tinnitus.   Eyes: Negative for blurred vision, double vision, pain and redness.  Respiratory: Positive for shortness of breath. Negative for cough and hemoptysis.   Cardiovascular: Positive for leg swelling. Negative for chest pain, palpitations and orthopnea.  Gastrointestinal: Negative for abdominal pain, constipation, diarrhea, nausea and vomiting.  Genitourinary: Positive for dysuria. Negative for frequency and hematuria.  Musculoskeletal: Negative for back pain, joint pain and neck pain.  Skin:       No acne, rash, or lesions  Neurological: Negative for dizziness, tremors, focal weakness and weakness.  Endo/Heme/Allergies: Negative for polydipsia. Does not bruise/bleed easily.  Psychiatric/Behavioral: Negative for depression. The patient is not nervous/anxious and does not have insomnia.      VITAL SIGNS:   Vitals:   12/07/16 2200 12/07/16 2230 12/07/16 2300 12/07/16 2330  BP: (!) 131/55 (!) 125/52 (!) 126/58 (!) 124/55  Pulse: 70 70 71 72  Resp: 18 (!) 22 13 (!) 21  Temp:      TempSrc:      SpO2: 98% 92% 95% 92%   Weight:      Height:       Wt Readings from Last 3 Encounters:  12/07/16 78.5 kg (173 lb)  12/06/16 79.4 kg (175 lb)  09/22/16 77.1 kg (170 lb)    PHYSICAL EXAMINATION:  Physical Exam  Vitals reviewed. Constitutional: She is oriented to person, place, and time. She appears well-developed and well-nourished. No distress.  HENT:  Head: Normocephalic and atraumatic.  Mouth/Throat: Oropharynx is clear and moist.  Eyes: Conjunctivae and EOM are normal. Pupils are equal, round, and reactive to light. No scleral icterus.  Neck: Normal range of motion. Neck supple. No JVD present. No thyromegaly present.  Cardiovascular: Normal rate, regular rhythm and intact distal pulses.  Exam reveals no gallop and no friction rub.   No murmur heard. Respiratory: Effort normal. No respiratory distress. She has no wheezes. She has rales.  GI: Soft. Bowel sounds are normal. She exhibits no distension. There is no tenderness.  Musculoskeletal: Normal range of motion. She exhibits edema.  No arthritis, no gout  Lymphadenopathy:    She has no cervical adenopathy.  Neurological: She is alert and oriented to person, place, and time. No cranial nerve deficit.  No dysarthria, no aphasia  Skin: Skin is warm and dry. No rash noted. No erythema.  Psychiatric: She has a normal mood and affect. Her behavior is normal. Judgment and thought content normal.    LABORATORY PANEL:   CBC  Recent Labs Lab 12/07/16 2038  WBC 12.6*  HGB 10.6*  HCT 32.4*  PLT 118*   ------------------------------------------------------------------------------------------------------------------  Chemistries   Recent Labs Lab 12/07/16 2038  NA 134*  K 4.9  CL 101  CO2 23  GLUCOSE 267*  BUN 37*  CREATININE 1.29*  CALCIUM 8.6*  AST 25  ALT 16  ALKPHOS 70  BILITOT 1.2   ------------------------------------------------------------------------------------------------------------------  Cardiac Enzymes  Recent  Labs Lab 12/07/16 2038  TROPONINI 0.03*   ------------------------------------------------------------------------------------------------------------------  RADIOLOGY:  Dg Chest 2 View  Result Date: 12/07/2016 CLINICAL DATA:  Symptoms of UTI for 3 days. EXAM: CHEST  2 VIEW COMPARISON:  12/06/2016 FINDINGS: Cardiac  pacemaker, stable. Enlarged cardiac silhouette. Mediastinal contours appear intact. Calcific atherosclerotic disease of the aorta. There is no evidence of focal airspace consolidation, pleural effusion or pneumothorax. Chronic coarsening of the interstitium. Osseous structures are without acute abnormality. Posterior spinal fusion with chronic compression deformity of T11 vertebral body. Soft tissues are grossly normal. IMPRESSION: Enlarged cardiac silhouette. Chronic coarsening of the interstitium. Calcific atherosclerotic disease of the aorta. Electronically Signed   By: Fidela Salisbury M.D.   On: 12/07/2016 22:12   Dg Chest 2 View  Result Date: 12/06/2016 CLINICAL DATA:  Coronary artery stent.  Urinary frequency. EXAM: CHEST  2 VIEW COMPARISON:  09/22/2016 FINDINGS: Dual lead cardiac pacemaker, stable. Cardiac stent present. Posterior spinal fusion of the thoraco lumbar spine with stable anterior compression deformity of T11 vertebral body. The cardiac silhouette is enlarged. Mediastinal contours appear intact. Calcific atherosclerotic disease and tortuosity of the aorta appear There is no evidence of focal airspace consolidation, pleural effusion or pneumothorax. Osseous structures are without acute abnormality. Soft tissues are grossly normal. IMPRESSION: Enlarged cardiac silhouette. No evidence of focal consolidation or pulmonary edema. Electronically Signed   By: Fidela Salisbury M.D.   On: 12/06/2016 16:39    EKG:   Orders placed or performed during the hospital encounter of 12/07/16  . ED EKG  . ED EKG  . EKG 12-Lead  . EKG 12-Lead    IMPRESSION AND PLAN:   Principal Problem:   Acute on chronic combined systolic and diastolic CHF (congestive heart failure) (HCC) - elevated BNP, chest exam consistent with heart failure, IV Lasix given in the ED, we will give another dose of IV Lasix, trend her troponin is her initial troponin was 0.03, get an echocardiogram and cardiology consult Active Problems:   UTI (urinary tract infection) - IV antibiotics, urine culture sent   Atrial fibrillation ([place]) - continue home meds including anticoagulation   Essential hypertension -continue home meds   Diabetes ([place]) - sliding scale insulin with corresponding glucose checks   COPD (chronic obstructive pulmonary disease) ([place]) - home dose inhalers  All the records are reviewed and case discussed with ED provider. Management plans discussed with the patient and/or family.  DVT PROPHYLAXIS: Systemic anticoagulation  GI PROPHYLAXIS:  None  ADMISSION STATUS: Inpatient  CODE STATUS: Full Code Status History    Date Active Date Inactive Code Status Order ID Comments User Context   09/07/2015  4:41 AM 09/13/2015  7:55 PM Full Code [id number]  Saundra Shelling, MD Inpatient   09/01/2015  3:33 PM 09/03/2015  7:07 PM Full Code [id number]  Epifanio Lesches, MD ED   03/21/2015  6:26 AM 03/22/2015  7:39 PM Full Code [id number]  Harrie Foreman, MD Inpatient      TOTAL TIME TAKING CARE OF THIS PATIENT: 45 minutes.   Grahm Etsitty [place] 12/07/2016, 11:50 PM  [facility]  Office  [phone]  CC: Primary care physician; Tracie Harrier, MD  Note:  This document was prepared using Dragon voice recognition software and may include unintentional dictation errors.

## 2016-12-07 NOTE — ED Provider Notes (Signed)
[facility] Emergency Department Provider Note  ____________________________________________  Time seen: Approximately 9:42 PM  I have reviewed the triage vital signs and the nursing notes.   HISTORY  Chief Complaint Altered Mental Status and Urinary Tract Infection   HPI [name] is a 83 y.o. female with a history of CHF, diabetes, hypertension who presents for evaluation of generalized weakness and shortness of breath. Patient reports 3 days of dysuria and urinary frequency. She went to [facility] urgent care yesterday was diagnosed with a UTI. She was started on Bactrim. She reports that throughout the night she deteriorated. She has had severe shortness of breath, unable to lay flat, increased pitting edema on her bilateral lower extremity, chills, continues to have the dysuria and frequency. She is also complaining of generalized body aches and nausea. No vomiting or diarrhea. No chest pain.  Past Medical History:  Diagnosis Date  . Cancer ([place])    skin ca  . CHF (congestive heart failure) ([place])   . Chronic back pain   . COPD (chronic obstructive pulmonary disease) ([place])   . Diabetes mellitus without complication ([place])   . Hypertension   . Poor perfusion of leg     Patient Active Problem List   Diagnosis Date Noted  . Ventral hernia without obstruction or gangrene   . Pneumonia 09/07/2015  . Hyponatremia 09/07/2015  . Acute respiratory failure ([place]) 09/01/2015  . Chronic diastolic heart failure ([place]) 04/10/2015  . Atrial fibrillation ([place]) 04/10/2015  . Essential hypertension 04/10/2015  . Diabetes ([place]) 04/10/2015  . Back pain 04/10/2015  . Acute on chronic systolic CHF (congestive heart failure) ([place]) 03/21/2015  . Pulmonary nodule 11/10/2014    Past Surgical History:  Procedure Laterality Date  . ABDOMINAL HYSTERECTOMY    . BACK SURGERY     x3  . CORONARY STENT PLACEMENT     unknown location per pt  . FOOT SURGERY    . LEG  SURGERY    . PACEMAKER PLACEMENT    . TONSILLECTOMY      Prior to Admission medications   Medication Sig Start Date End Date Taking? Authorizing Provider  albuterol (PROVENTIL HFA;VENTOLIN HFA) 108 (90 Base) MCG/ACT inhaler Inhale 1-2 puffs into the lungs every 6 (six) hours as needed for wheezing or shortness of breath. 09/22/16   Harrie Foreman, MD  apixaban (ELIQUIS) 5 MG TABS tablet Take 5 mg by mouth 2 (two) times daily.    [provider]  baclofen (LIORESAL) 10 MG tablet Take 10 mg by mouth 3 (three) times daily.    [provider]  budesonide-formoterol (SYMBICORT) 160-4.5 MCG/ACT inhaler Inhale 2 puffs into the lungs 2 (two) times daily. 09/03/15   Gladstone Lighter, MD  carvedilol (COREG) 6.25 MG tablet Take 6.25 mg by mouth 2 (two) times daily with a meal.     [provider]  cholecalciferol (VITAMIN D) 1000 units tablet Take 1,000 Units by mouth daily.    [provider]  docusate sodium (COLACE) 100 MG capsule Take 100 mg by mouth 2 (two) times daily.     [provider]  doxycycline (VIBRAMYCIN) 50 MG capsule Take 1 capsule (50 mg total) by mouth 2 (two) times daily. 10/17/15   Lorin Picket, PA-C  escitalopram (LEXAPRO) 10 MG tablet Take 10 mg by mouth daily.    [provider]  fentaNYL (DURAGESIC - DOSED MCG/HR) 50 MCG/HR Place 50 mcg onto the skin every other day.  [provider]  ferrous sulfate 325 (65 FE) MG tablet Take 325 mg by mouth daily with breakfast.    [provider]  furosemide (LASIX) 40 MG tablet Take 40 mg by mouth every other day. Pt alternates with the 80mg  tablet.    [provider]  furosemide (LASIX) 80 MG tablet Take 80 mg by mouth every other day. Pt alternates with the 40mg  tablet.    [provider]  glimepiride (AMARYL) 4 MG tablet Take 4 mg by mouth 2 (two) times daily after a meal.    [provider]  insulin aspart (NOVOLOG FLEXPEN) 100  UNIT/ML FlexPen Inject 5 Units into the skin daily before lunch. IF blood sugar is greater than 200 09/22/16   Harrie Foreman, MD  losartan (COZAAR) 25 MG tablet Take 25 mg by mouth daily.    [provider]  magnesium oxide (MAG-OX) 400 MG tablet Take 400 mg by mouth daily.    [provider]  Melatonin 1 MG TABS Take 1 mg by mouth at bedtime.    [provider]  metolazone (ZAROXOLYN) 2.5 MG tablet Take 2.5 mg by mouth 2 (two) times a week. Pt takes on Monday and Friday.    [provider]  mupirocin ointment (BACTROBAN) 2 % Apply 1 application topically 3 (three) times daily. 10/17/15   Lorin Picket, PA-C  oxyCODONE (OXY IR/ROXICODONE) 5 MG immediate release tablet Take 5-10 mg by mouth every 4 (four) hours as needed for severe pain.     [provider]  pantoprazole (PROTONIX) 40 MG tablet Take 40 mg by mouth daily.    [provider]  potassium chloride (KLOR-CON) 20 MEQ packet Take by mouth 2 (two) times daily.    [provider]  predniSONE (STERAPRED UNI-PAK 21 TAB) 10 MG (21) TBPK tablet Take 1 tablet (10 mg total) by mouth daily. Start 60 mg po daily, taper 10 mg daily until done 09/13/15   Max Sane, MD  rOPINIRole (REQUIP) 0.5 MG tablet Take 0.5 mg by mouth 2 (two) times daily.    [provider]  sitaGLIPtin (JANUVIA) 100 MG tablet Take 100 mg by mouth daily.    [provider]  spironolactone (ALDACTONE) 25 MG tablet Take 25 mg by mouth daily.     [provider]  sulfamethoxazole-trimethoprim (BACTRIM DS,SEPTRA DS) 800-160 MG tablet Take 1 tablet by mouth 2 (two) times daily. 12/06/16   Lorin Picket, PA-C  vitamin B-12 (CYANOCOBALAMIN) 1000 MCG tablet Take 1,000 mcg by mouth daily.    [provider]  vitamin C (ASCORBIC ACID) 500 MG tablet Take 500 mg by mouth daily.    [provider]  Vitamin D, Ergocalciferol, (DRISDOL) 50000 UNITS CAPS capsule Take 50,000 Units by  mouth every 7 (seven) days. Pt takes on Thursday.    [provider]    Allergies Ceftriaxone; Cefuroxime; Cephalosporins; Codeine; Epinephrine; Morphine and related; Buprenorphine hcl; Ciprofloxacin; Latex; Lidocaine; and Procaine  Family History  Problem Relation Age of Onset  . CVA Mother        Congestive Heart Failure, heart attack, hypertension, stroke  . Diabetes Mellitus II Sister   . Heart disease Father        Congestive Heart Failure, heart attack, hypertension    Social History Social History  Substance Use Topics  . Smoking status: Never Smoker  . Smokeless tobacco: Never Used  . Alcohol use No    Review of Systems  Constitutional: Negative  for fever. + Chills and body aches Eyes: Negative for visual changes. ENT: Negative for sore throat. Neck: No neck pain  Cardiovascular: Negative for chest pain. Respiratory: + shortness of breath. Gastrointestinal: Negative for abdominal pain, vomiting or diarrhea. Genitourinary: + dysuria and frequency Musculoskeletal: Negative for back pain. + Bilateral lower extremity edema Skin: Negative for rash. Neurological: Negative for headaches, weakness or numbness. Psych: No SI or HI  ____________________________________________   PHYSICAL EXAM:  VITAL SIGNS: ED Triage Vitals  Enc Vitals Group     BP 12/07/16 2032 (!) 141/59     Pulse Rate 12/07/16 2032 73     Resp 12/07/16 2032 19     Temp 12/07/16 2039 98.3 F (36.8 C)     Temp Source 12/07/16 2039 Oral     SpO2 12/07/16 2032 95 %     Weight 12/07/16 2033 173 lb (78.5 kg)     Height 12/07/16 2033 5\' 7"  (1.702 m)     Head Circumference --      Peak Flow --      Pain Score 12/07/16 2032 9     Pain Loc --      Pain Edu? --      Excl. in [place]? --     Constitutional: Alert and oriented, moderate respiratory distress.  HEENT:      Head: Normocephalic and atraumatic.         Eyes: Conjunctivae are normal. Sclera is non-icteric.       Mouth/Throat:  Mucous membranes are dry.       Neck: Supple with no signs of meningismus. Cardiovascular: Regular rate and rhythm. No murmurs, gallops, or rubs. 2+ symmetrical distal pulses are present in all extremities. No JVD. Respiratory: Increased work of breathing, hypoxic on room air, satting 97% on 3 L nasal cannula, decreased air movement bilaterally with faint expiratory wheezes and crackles on the basis.  Gastrointestinal: Soft, non tender, and non distended with positive bowel sounds. No rebound or guarding. Musculoskeletal: 2+ pitting edema bilateral lower extremities Neurologic: Normal speech and language. Face is symmetric. Moving all extremities. No gross focal neurologic deficits are appreciated. Skin: Skin is warm, dry and intact. No rash noted. Psychiatric: Mood and affect are normal. Speech and behavior are normal.  ____________________________________________   LABS (all labs ordered are listed, but only abnormal results are displayed)  Labs Reviewed  COMPREHENSIVE METABOLIC PANEL - Abnormal; Notable for the following:       Result Value   Sodium 134 (*)    Glucose, Bld 267 (*)    BUN 37 (*)    Creatinine, Ser 1.29 (*)    Calcium 8.6 (*)    GFR calc non Af Amer 36 (*)    GFR calc Af Amer 41 (*)    All other components within normal limits  CBC - Abnormal; Notable for the following:    WBC 12.6 (*)    RBC 3.62 (*)    Hemoglobin 10.6 (*)    HCT 32.4 (*)    RDW 16.3 (*)    Platelets 118 (*)    All other components within normal limits  BRAIN NATRIURETIC PEPTIDE - Abnormal; Notable for the following:    B Natriuretic Peptide 537.0 (*)    All other components within normal limits  TROPONIN I - Abnormal; Notable for the following:    Troponin I 0.03 (*)    All other components within normal limits  URINE CULTURE  LACTIC ACID, PLASMA  LACTIC ACID, PLASMA  URINALYSIS, COMPLETE (UACMP) WITH MICROSCOPIC  CBG MONITORING, ED    ____________________________________________  EKG  ED ECG REPORT I, Rudene Re, the attending physician, personally viewed and interpreted this ECG.  Ventricular paced rhythm with rate of 72, normal QTC, left axis deviation, no ST elevations or depressions, T-wave inversions in 1 and aVL.  ____________________________________________  RADIOLOGY  CXR: Enlarged cardiac silhouette.  Chronic coarsening of the interstitium.  Calcific atherosclerotic disease of the aorta. ____________________________________________   PROCEDURES  Procedure(s) performed: None Procedures Critical Care performed: yes  CRITICAL CARE Performed by: Rudene Re  ?  Total critical care time: 35 min  Critical care time was exclusive of separately billable procedures and treating other patients.  Critical care was necessary to treat or prevent imminent or life-threatening deterioration.  Critical care was time spent personally by me on the following activities: development of treatment plan with patient and/or surrogate as well as nursing, discussions with consultants, evaluation of patient's response to treatment, examination of patient, obtaining history from patient or surrogate, ordering and performing treatments and interventions, ordering and review of laboratory studies, ordering and review of radiographic studies, pulse oximetry and re-evaluation of patient's condition.  ____________________________________________   INITIAL IMPRESSION / ASSESSMENT AND PLAN / ED COURSE  83 y.o. female with a history of CHF, diabetes, hypertension who presents for evaluation of generalized weakness, body aches, shortness of breath, orthopnea, leg edema, chills, dysuria. Patient with increased work of breathing and the oxygen requirement, has decreased air movement bilaterally with crackles and wheezes also 2+ pitting edema in her lower extremities. BNP is elevated with obvious evidence of volume  overload. Patient was given 40 mg of IV Lasix. UA consistent with persistent infection and elevated WBC patient given zosyn. Patient be admitted to the hospitalist service for acute respiratory failure     Pertinent labs & imaging results that were available during my care of the patient were reviewed by me and considered in my medical decision making (see chart for details).    ____________________________________________   FINAL CLINICAL IMPRESSION(S) / ED DIAGNOSES  Acute respiratory failure with hypoxia (HCC) Acute on chronic congestive heart failure, unspecified heart failure type ([place]) Acute cystitis without hematuria   NEW MEDICATIONS STARTED DURING THIS VISIT:  New Prescriptions   No medications on file     Note:  This document was prepared using Dragon voice recognition software and may include unintentional dictation errors.    Rudene Re, MD 12/07/16 [phone]

## 2016-12-08 ENCOUNTER — Inpatient Hospital Stay
Admit: 2016-12-08 | Discharge: 2016-12-08 | Disposition: A | Discharge status: Home / Self Care | Payer: Medicare Other | Attending: Internal Medicine | Admitting: Internal Medicine

## 2016-12-08 DIAGNOSIS — N3000 Acute cystitis without hematuria: Secondary | ICD-10-CM | POA: Diagnosis not present

## 2016-12-08 DIAGNOSIS — I130 Hypertensive heart and chronic kidney disease with heart failure and stage 1 through stage 4 chronic kidney disease, or unspecified chronic kidney disease: Secondary | ICD-10-CM | POA: Diagnosis not present

## 2016-12-08 LAB — BASIC METABOLIC PANEL
Anion gap: 8 (ref 5–15)
BUN: 36 mg/dL — ABNORMAL HIGH (ref 6–20)
CHLORIDE: 100 mmol/L — AB (ref 101–111)
CO2: 30 mmol/L (ref 22–32)
Calcium: 8.8 mg/dL — ABNORMAL LOW (ref 8.9–10.3)
Creatinine, Ser: 1.39 mg/dL — ABNORMAL HIGH (ref 0.44–1.00)
GFR, EST AFRICAN AMERICAN: 38 mL/min — AB (ref >60)
GFR, EST NON AFRICAN AMERICAN: 33 mL/min — AB (ref >60)
Glucose, Bld: 199 mg/dL — ABNORMAL HIGH (ref 65–99)
POTASSIUM: 4.2 mmol/L (ref 3.5–5.1)
SODIUM: 138 mmol/L (ref 135–145)

## 2016-12-08 LAB — GLUCOSE, CAPILLARY
GLUCOSE-CAPILLARY: 214 mg/dL — AB (ref 65–99)
GLUCOSE-CAPILLARY: 150 mg/dL — AB (ref 65–99)
Glucose-Capillary: 184 mg/dL — ABNORMAL HIGH (ref 65–99)
Glucose-Capillary: 173 mg/dL — ABNORMAL HIGH (ref 65–99)

## 2016-12-08 LAB — TROPONIN I
TROPONIN I: 0.06 ng/mL — AB (ref <0.03)
Troponin I: 0.05 ng/mL (ref <0.03)
Troponin I: 0.05 ng/mL (ref <0.03)

## 2016-12-08 LAB — CBC
HCT: 31.7 % — ABNORMAL LOW (ref 35.0–47.0)
Hemoglobin: 10.7 g/dL — ABNORMAL LOW (ref 12.0–16.0)
MCH: 30.1 pg (ref 26.0–34.0)
MCHC: 33.8 g/dL (ref 32.0–36.0)
MCV: 89.1 fL (ref 80.0–100.0)
PLATELETS: 122 10*3/uL — AB (ref 150–440)
RBC: 3.56 MIL/uL — AB (ref 3.80–5.20)
RDW: 16.3 % — ABNORMAL HIGH (ref 11.5–14.5)
WBC: 10.3 10*3/uL (ref 3.6–11.0)

## 2016-12-08 LAB — ECHOCARDIOGRAM COMPLETE
HEIGHTINCHES: 67 in
Weight: 2835.2 oz

## 2016-12-08 MED ORDER — CARVEDILOL 6.25 MG PO TABS
6.2500 mg | ORAL_TABLET | Freq: Two times a day (BID) | ORAL | Status: DC
  Administered 2016-12-08 – 2016-12-09 (×3): 6.25 mg via ORAL
  Filled 2016-12-08 (×3): qty 1

## 2016-12-08 MED ORDER — ESCITALOPRAM OXALATE 10 MG PO TABS
10.0000 mg | ORAL_TABLET | Freq: Every day | ORAL | Status: DC
  Administered 2016-12-08 – 2016-12-09 (×2): 10 mg via ORAL
  Filled 2016-12-08 (×3): qty 1

## 2016-12-08 MED ORDER — ROPINIROLE HCL 1 MG PO TABS
0.5000 mg | ORAL_TABLET | Freq: Two times a day (BID) | ORAL | Status: DC
  Administered 2016-12-08 – 2016-12-09 (×4): 0.5 mg via ORAL
  Filled 2016-12-08 (×4): qty 1

## 2016-12-08 MED ORDER — FUROSEMIDE 40 MG PO TABS: 40.0000 mg | ORAL_TABLET | ORAL | Status: DC

## 2016-12-08 MED ORDER — FENTANYL 50 MCG/HR TD PT72
50.0000 ug | MEDICATED_PATCH | TRANSDERMAL | Status: DC
  Administered 2016-12-08: 50 ug via TRANSDERMAL
  Filled 2016-12-08: qty 1

## 2016-12-08 MED ORDER — ACETAMINOPHEN 650 MG RE SUPP: 650.0000 mg | Freq: Four times a day (QID) | RECTAL | Status: DC | PRN

## 2016-12-08 MED ORDER — LOSARTAN POTASSIUM 50 MG PO TABS
25.0000 mg | ORAL_TABLET | Freq: Every day | ORAL | Status: DC
  Administered 2016-12-08 – 2016-12-09 (×2): 25 mg via ORAL
  Filled 2016-12-08 (×2): qty 1

## 2016-12-08 MED ORDER — FUROSEMIDE 10 MG/ML IJ SOLN
40.0000 mg | Freq: Once | INTRAMUSCULAR | Status: AC
  Administered 2016-12-08: 40 mg via INTRAVENOUS
  Filled 2016-12-08: qty 160
  Filled 2016-12-08: qty 4

## 2016-12-08 MED ORDER — MOMETASONE FURO-FORMOTEROL FUM 200-5 MCG/ACT IN AERO
2.0000 | INHALATION_SPRAY | Freq: Two times a day (BID) | RESPIRATORY_TRACT | Status: DC
  Administered 2016-12-08 – 2016-12-09 (×4): 2 via RESPIRATORY_TRACT
  Filled 2016-12-08: qty 8.8

## 2016-12-08 MED ORDER — ONDANSETRON HCL 4 MG PO TABS: 4.0000 mg | ORAL_TABLET | Freq: Four times a day (QID) | ORAL | Status: DC | PRN

## 2016-12-08 MED ORDER — SPIRONOLACTONE 25 MG PO TABS
25.0000 mg | ORAL_TABLET | Freq: Every day | ORAL | Status: DC
  Administered 2016-12-08 – 2016-12-09 (×2): 25 mg via ORAL
  Filled 2016-12-08 (×2): qty 1

## 2016-12-08 MED ORDER — FUROSEMIDE 10 MG/ML IJ SOLN
40.0000 mg | Freq: Every day | INTRAMUSCULAR | Status: DC
  Administered 2016-12-08: 40 mg via INTRAVENOUS
  Filled 2016-12-08: qty 4
  Filled 2016-12-08: qty 160
  Filled 2016-12-08: qty 4

## 2016-12-08 MED ORDER — APIXABAN 5 MG PO TABS
5.0000 mg | ORAL_TABLET | Freq: Two times a day (BID) | ORAL | Status: DC
  Administered 2016-12-08 – 2016-12-09 (×4): 5 mg via ORAL
  Filled 2016-12-08 (×4): qty 1

## 2016-12-08 MED ORDER — FUROSEMIDE 40 MG PO TABS: 40.0000 mg | ORAL_TABLET | Freq: Every day | ORAL | Status: DC

## 2016-12-08 MED ORDER — ACETAMINOPHEN 325 MG PO TABS: 650.0000 mg | ORAL_TABLET | Freq: Four times a day (QID) | ORAL | Status: DC | PRN

## 2016-12-08 MED ORDER — ONDANSETRON HCL 4 MG/2ML IJ SOLN
4.0000 mg | Freq: Four times a day (QID) | INTRAMUSCULAR | Status: DC | PRN
  Administered 2016-12-08 – 2016-12-09 (×2): 4 mg via INTRAVENOUS
  Filled 2016-12-08 (×2): qty 2

## 2016-12-08 MED ORDER — PANTOPRAZOLE SODIUM 40 MG PO TBEC
40.0000 mg | DELAYED_RELEASE_TABLET | Freq: Every day | ORAL | Status: DC
Start: 2016-12-08 — End: 2016-12-09
  Administered 2016-12-08 – 2016-12-09 (×2): 40 mg via ORAL
  Filled 2016-12-08 (×2): qty 1

## 2016-12-08 MED ORDER — FUROSEMIDE 40 MG PO TABS
80.0000 mg | ORAL_TABLET | ORAL | Status: DC
  Administered 2016-12-08: 80 mg via ORAL
  Filled 2016-12-08: qty 2

## 2016-12-08 MED ORDER — INSULIN ASPART 100 UNIT/ML ~~LOC~~ SOLN
0.0000 [IU] | Freq: Three times a day (TID) | SUBCUTANEOUS | Status: DC
  Administered 2016-12-08: 3 [IU] via SUBCUTANEOUS
  Administered 2016-12-08: 1 [IU] via SUBCUTANEOUS
  Administered 2016-12-08 – 2016-12-09 (×2): 2 [IU] via SUBCUTANEOUS
  Administered 2016-12-09: 1 [IU] via SUBCUTANEOUS
  Filled 2016-12-08 (×5): qty 1

## 2016-12-08 MED ORDER — LEVOFLOXACIN IN D5W 750 MG/150ML IV SOLN
750.0000 mg | INTRAVENOUS | Status: DC
  Administered 2016-12-08: 750 mg via INTRAVENOUS
  Filled 2016-12-08 (×2): qty 150

## 2016-12-08 MED ORDER — OXYCODONE HCL 5 MG PO TABS
5.0000 mg | ORAL_TABLET | ORAL | Status: DC | PRN
  Administered 2016-12-08: 5 mg via ORAL
  Filled 2016-12-08: qty 1

## 2016-12-08 NOTE — Progress Notes (Signed)
*  PRELIMINARY RESULTS* Echocardiogram 2D Echocardiogram has been performed.  [name] 12/08/2016, 9:10 AM

## 2016-12-08 NOTE — Consult Note (Signed)
[name] CONSULT NOTE  Patient ID: [name] MRN: [id number] DOB/AGE: 81-Apr-1926 81 y.o.  Admit date: 12/07/2016 Referring Physician Dr. Benjie Karvonen Primary Physician   Primary Cardiologist Dr. Nehemiah Massed Reason for Consultation uti  HPI: Patient is a 83 year old female with history of diabetes, COPD, hypertension, congestive heart failure who was admitted after presenting to emergency room with complaints  of weakness and fatigue. She had borderline troponin elevation which was consistent with demand ischemia. There is no evidence of acute coronary syndrome. She ruled out for a myocardial infarction. Urinalysis revealed urinary tract infection. Urine culture is pending. BNP is 537. Chest x-ray revealed no significant pulmonary edema. Echocardiogram revealed ejection fraction of 45-50% with mild global hypokinesis. She has a history of chronic atrial fibrillation as well as high-grade heart block and has a permanent pacemaker in place. She has been on carvedilol at 6.25 mg twice daily for rate control. She has been on Xarelto at 15 mg daily for anticoagulation. She has been doing a lot better since admission. She is anxious to go home. Her electrocardiogram revealed ventricular paced rhythm.  Review of Systems  Constitutional: Positive for malaise/fatigue.  HENT: Negative.   Eyes: Negative.   Respiratory: Positive for shortness of breath.   Cardiovascular: Negative.   Gastrointestinal: Negative.   Genitourinary: Positive for dysuria, frequency and urgency.  Musculoskeletal: Negative.   Skin: Negative.   Neurological: Positive for weakness.  Endo/Heme/Allergies: Negative.   Psychiatric/Behavioral: Negative.     Past Medical History:  Diagnosis Date  . Cancer ([place])    skin ca  . CHF (congestive heart failure) ([place])   . Chronic back pain   . COPD (chronic obstructive pulmonary disease) ([place])   . Diabetes mellitus  without complication ([place])   . Hypertension   . Poor perfusion of leg     Family History  Problem Relation Age of Onset  . CVA Mother        Congestive Heart Failure, heart attack, hypertension, stroke  . Diabetes Mellitus II Sister   . Heart disease Father        Congestive Heart Failure, heart attack, hypertension    Social History   Social History  . Marital status: Married    Spouse name: N/A  . Number of children: N/A  . Years of education: N/A   Occupational History  . Not on file.   Social History Main Topics  . Smoking status: Never Smoker  . Smokeless tobacco: Never Used  . Alcohol use No  . Drug use: No  . Sexual activity: Not on file   Other Topics Concern  . Not on file   Social History Narrative  . No narrative on file    Past Surgical History:  Procedure Laterality Date  . ABDOMINAL HYSTERECTOMY    . BACK SURGERY     x3  . CORONARY STENT PLACEMENT     unknown location per pt  . FOOT SURGERY    . LEG SURGERY    . PACEMAKER PLACEMENT    . TONSILLECTOMY       Prescriptions Prior to Admission  Medication Sig Dispense Refill Last Dose  . albuterol (PROVENTIL HFA;VENTOLIN HFA) 108 (90 Base) MCG/ACT inhaler Inhale 1-2 puffs into the lungs every 6 (six) hours as needed for wheezing or shortness of breath. 1 Inhaler 0   . apixaban (ELIQUIS) 5 MG TABS tablet Take 5 mg by mouth 2 (two) times daily.  12/07/2016 at Unknown time  . baclofen (LIORESAL) 10 MG tablet Take 10 mg by mouth 3 (three) times daily.   12/07/2016 at Unknown time  . budesonide-formoterol (SYMBICORT) 160-4.5 MCG/ACT inhaler Inhale 2 puffs into the lungs 2 (two) times daily. 1 Inhaler 12 12/07/2016 at Unknown time  . carvedilol (COREG) 6.25 MG tablet Take 6.25 mg by mouth 2 (two) times daily with a meal.    12/07/2016 at Unknown time  . cholecalciferol (VITAMIN D) 1000 units tablet Take 1,000 Units by mouth daily.   12/07/2016 at Unknown time  . docusate sodium (COLACE) 100 MG capsule Take 100  mg by mouth 2 (two) times daily.    12/07/2016 at Unknown time  . escitalopram (LEXAPRO) 10 MG tablet Take 10 mg by mouth daily.   12/07/2016 at Unknown time  . fentaNYL (DURAGESIC - DOSED MCG/HR) 50 MCG/HR Place 50 mcg onto the skin every other day.    unknown at unknown  . ferrous sulfate 325 (65 FE) MG tablet Take 325 mg by mouth daily with breakfast.   12/07/2016 at Unknown time  . furosemide (LASIX) 40 MG tablet Take 40 mg by mouth every other day. Pt alternates with the 80mg  tablet.   unknown at unknown  . furosemide (LASIX) 80 MG tablet Take 80 mg by mouth every other day. Pt alternates with the 40mg  tablet.   unknown at unknown  . glimepiride (AMARYL) 4 MG tablet Take 4 mg by mouth 2 (two) times daily after a meal.   12/07/2016 at Unknown time  . insulin aspart (NOVOLOG FLEXPEN) 100 UNIT/ML FlexPen Inject 5 Units into the skin daily before lunch. IF blood sugar is greater than 200 15 mL 11 12/07/2016 at Unknown time  . losartan (COZAAR) 25 MG tablet Take 25 mg by mouth daily.   12/07/2016 at Unknown time  . magnesium oxide (MAG-OX) 400 MG tablet Take 400 mg by mouth daily.   12/07/2016 at Unknown time  . Melatonin 1 MG TABS Take 1 mg by mouth at bedtime.   12/06/2016 at Unknown time  . metolazone (ZAROXOLYN) 2.5 MG tablet Take 2.5 mg by mouth 2 (two) times a week. Pt takes on Monday and Friday.   12/06/2016 at Unknown time  . oxyCODONE (OXY IR/ROXICODONE) 5 MG immediate release tablet Take 5-10 mg by mouth every 4 (four) hours as needed for severe pain.    prn at prn  . pantoprazole (PROTONIX) 40 MG tablet Take 40 mg by mouth daily.   12/07/2016 at Unknown time  . potassium chloride (KLOR-CON) 20 MEQ packet Take by mouth 2 (two) times daily.   12/07/2016 at Unknown time  . rOPINIRole (REQUIP) 0.5 MG tablet Take 0.5 mg by mouth 2 (two) times daily.   12/07/2016 at Unknown time  . sitaGLIPtin (JANUVIA) 100 MG tablet Take 100 mg by mouth daily.   12/07/2016 at Unknown time  . spironolactone (ALDACTONE) 25  MG tablet Take 25 mg by mouth daily.    12/07/2016 at Unknown time  . sulfamethoxazole-trimethoprim (BACTRIM DS,SEPTRA DS) 800-160 MG tablet Take 1 tablet by mouth 2 (two) times daily. 14 tablet 0 12/07/2016 at Unknown time  . vitamin B-12 (CYANOCOBALAMIN) 1000 MCG tablet Take 1,000 mcg by mouth daily.   12/07/2016 at Unknown time  . vitamin C (ASCORBIC ACID) 500 MG tablet Take 500 mg by mouth daily.   12/07/2016 at Unknown time  . doxycycline (VIBRAMYCIN) 50 MG capsule Take 1 capsule (50 mg total) by mouth 2 (two) times daily. (  Patient not taking: Reported on 12/07/2016) 10 capsule 0 Completed Course at Unknown time  . mupirocin ointment (BACTROBAN) 2 % Apply 1 application topically 3 (three) times daily. (Patient not taking: Reported on 12/07/2016) 22 g 0 Not Taking at Unknown time  . predniSONE (STERAPRED UNI-PAK 21 TAB) 10 MG (21) TBPK tablet Take 1 tablet (10 mg total) by mouth daily. Start 60 mg po daily, taper 10 mg daily until done (Patient not taking: Reported on 12/07/2016) 21 tablet 0 Completed Course at Unknown time  . Vitamin D, Ergocalciferol, (DRISDOL) 50000 UNITS CAPS capsule Take 50,000 Units by mouth every 7 (seven) days. Pt takes on Thursday.   12/05/2016    Physical Exam: Blood pressure (!) 117/41, pulse 70, temperature 98.6 F (37 C), temperature source Oral, resp. rate 18, height 5\' 7"  (1.702 m), weight 80.4 kg (177 lb 3.2 oz), SpO2 94 %.   Wt Readings from Last 1 Encounters:  12/08/16 80.4 kg (177 lb 3.2 oz)     General appearance: alert and cooperative Resp: clear to auscultation bilaterally Chest wall: no tenderness Cardio: Regular ventricular paced rhythm with underlying atrial fibrillation. GI: soft, non-tender; bowel sounds normal; no masses,  no organomegaly Extremities: extremities normal, atraumatic, no cyanosis or edema Pulses: 2+ and symmetric Neurologic: Grossly normal  Labs:   Lab Results  Component Value Date   WBC 10.3 12/08/2016   HGB 10.7 (L) 12/08/2016     HCT 31.7 (L) 12/08/2016   MCV 89.1 12/08/2016   PLT 122 (L) 12/08/2016    Recent Labs Lab 12/07/16 2038 12/08/16 0149  NA 134* 138  K 4.9 4.2  CL 101 100*  CO2 23 30  BUN 37* 36*  CREATININE 1.29* 1.39*  CALCIUM 8.6* 8.8*  PROT 6.7  --   BILITOT 1.2  --   ALKPHOS 70  --   ALT 16  --   AST 25  --   GLUCOSE 267* 199*   Lab Results  Component Value Date   CKTOTAL 39 03/04/2012   CKMB 0.7 03/04/2012   TROPONINI 0.05 ([place]) 12/08/2016      Radiology: No significant pulmonary edema noted EKG: Atrial fibrillation with ventricular paced rhythm  ASSESSMENT AND PLAN:  83 year old female with history of chronic A. fib as well as high-grade heart block with permanent pacemaker in place who presented to the emergency room with urinary symptoms weakness and fatigue. She was noted have a urinary tract infection. Urine cultures are pending. Chest x-ray revealed no high-grade pulmonary edema. Echo showed ejection fraction of 45-50%. She denies any current shortness of breath. Her serum troponin was mildly elevated but appears to be demand ischemia. Patient is ruled out for myocardial infarction. Renal function shows chronic kidney disease with a serum creatinine of 1.29-1.39. Pacemaker is functioning normally. Does not appear tachycardic further cardiac workup. Would continue with aggressive diuresis including Lasix, valsartan, spironolactone and metolazone needed. When ambulating consider discharge with follow-up per Dr. Serafina Royals. Signed: Teodoro Spray MD, [facility] 12/08/2016, 10:54 AM

## 2016-12-08 NOTE — Progress Notes (Signed)
[facility] [name]    MR#:  [id number]  DATE OF BIRTH:  06/28/1924  SUBJECTIVE:   Patient here with shortness of breath due to CHF exacerbation. Patient is feeling better this morning.  REVIEW OF SYSTEMS:    Review of Systems  Constitutional: Negative for fever, chills weight loss HENT: Negative for ear pain, nosebleeds, congestion, facial swelling, rhinorrhea, neck pain, neck stiffness and ear discharge.   Respiratory: Improved shortness of breath no wheezing or cough  Cardiovascular: Negative for chest pain, palpitations and leg swelling.  Gastrointestinal: Negative for heartburn, abdominal pain, vomiting, diarrhea or consitpation Genitourinary: Negative for dysuria, urgency, frequency, hematuria Musculoskeletal: Negative for back pain or joint pain Neurological: Negative for dizziness, seizures, syncope, focal weakness,  numbness and headaches.  Hematological: Does not bruise/bleed easily.  Psychiatric/Behavioral: Negative for hallucinations, confusion, dysphoric mood    Tolerating Diet: yes      DRUG ALLERGIES:   Allergies  Allergen Reactions  . Ceftriaxone Swelling  . Cefuroxime Hives  . Cephalosporins Swelling and Other (See Comments)    Reaction:  Fainting/dry mouth   . Codeine Nausea And Vomiting  . Epinephrine Other (See Comments)    Reaction:  Fainting   . Morphine And Related Other (See Comments)    Reaction:  GI upset   . Buprenorphine Hcl Nausea And Vomiting  . Ciprofloxacin Itching, Swelling and Rash  . Latex Rash  . Lidocaine Rash  . Procaine Rash    VITALS:  Blood pressure (!) 117/41, pulse 70, temperature 98.6 F (37 C), temperature source Oral, resp. rate 18, height 5\' 7"  (1.702 m), weight 80.4 kg (177 lb 3.2 oz), SpO2 94 %.  PHYSICAL EXAMINATION:  Constitutional: Appears well-developed and well-nourished. No distress. HENT: Normocephalic. Marland Kitchen Oropharynx is clear and moist.  Eyes:  Conjunctivae and EOM are normal. PERRLA, no scleral icterus.  Neck: Normal ROM. Neck supple. No JVD. No tracheal deviation. CVS: RRR, S1/S2 +, no murmurs, no gallops, no carotid bruit.  Pulmonary: Normal effort with bilateral crackles at bases  Abdominal: Soft. BS +,  no distension, tenderness, rebound or guarding.  Musculoskeletal: Normal range of motion. No edema and no tenderness.  Neuro: Alert. CN 2-12 grossly intact. No focal deficits. Skin: Left foot small ulcer  Psychiatric: Normal mood and affect.      LABORATORY PANEL:   CBC  Recent Labs Lab 12/08/16 0149  WBC 10.3  HGB 10.7*  HCT 31.7*  PLT 122*   ------------------------------------------------------------------------------------------------------------------  Chemistries   Recent Labs Lab 12/07/16 2038 12/08/16 0149  NA 134* 138  K 4.9 4.2  CL 101 100*  CO2 23 30  GLUCOSE 267* 199*  BUN 37* 36*  CREATININE 1.29* 1.39*  CALCIUM 8.6* 8.8*  AST 25  --   ALT 16  --   ALKPHOS 70  --   BILITOT 1.2  --    ------------------------------------------------------------------------------------------------------------------  Cardiac Enzymes  Recent Labs Lab 12/07/16 2038 12/08/16 0149 12/08/16 0754  TROPONINI 0.03* 0.06* 0.05*   ------------------------------------------------------------------------------------------------------------------  RADIOLOGY:  Dg Chest 2 View  Result Date: 12/07/2016 CLINICAL DATA:  Symptoms of UTI for 3 days. EXAM: CHEST  2 VIEW COMPARISON:  12/06/2016 FINDINGS: Cardiac pacemaker, stable. Enlarged cardiac silhouette. Mediastinal contours appear intact. Calcific atherosclerotic disease of the aorta. There is no evidence of focal airspace consolidation, pleural effusion or pneumothorax. Chronic coarsening of the interstitium. Osseous structures are without acute abnormality. Posterior spinal fusion with chronic compression deformity of T11  vertebral body. Soft tissues are grossly  normal. IMPRESSION: Enlarged cardiac silhouette. Chronic coarsening of the interstitium. Calcific atherosclerotic disease of the aorta. Electronically Signed   By: Fidela Salisbury M.D.   On: 12/07/2016 22:12   Dg Chest 2 View  Result Date: 12/06/2016 CLINICAL DATA:  Coronary artery stent.  Urinary frequency. EXAM: CHEST  2 VIEW COMPARISON:  09/22/2016 FINDINGS: Dual lead cardiac pacemaker, stable. Cardiac stent present. Posterior spinal fusion of the thoraco lumbar spine with stable anterior compression deformity of T11 vertebral body. The cardiac silhouette is enlarged. Mediastinal contours appear intact. Calcific atherosclerotic disease and tortuosity of the aorta appear There is no evidence of focal airspace consolidation, pleural effusion or pneumothorax. Osseous structures are without acute abnormality. Soft tissues are grossly normal. IMPRESSION: Enlarged cardiac silhouette. No evidence of focal consolidation or pulmonary edema. Electronically Signed   By: Fidela Salisbury M.D.   On: 12/06/2016 16:39     ASSESSMENT AND PLAN:   83 year old female with combined systolic and diastolic heart failure who presents with shortness of breath.  1. Acute on chronic hypoxic respiratory failure in the setting of acute exacerbation of CHF. Wean oxygen as tolerated. Patient wears oxygen only at night.  2. Acute on chronic combined systolic and diastolic heart failure: Continue IV Lasix Monitor intake and output Echo pending Follow-up on cardiology consult  3. Urinary tract infection: Continue Levaquin and follow up on final urine culture  4. Atrial fibrillation: Continue Coreg for heart rate control and Eliquis  5. Diabetes: Continue sliding scale insulin  6. COPD without exacerbation: Continue nebulizers  Management plans discussed with the patient and she is in agreement.  CODE STATUS: full  TOTAL TIME TAKING CARE OF THIS PATIENT: 30 minutes.  Physical therapy consultation for  discharge planning   POSSIBLE D/C 1-2 days, DEPENDING ON CLINICAL CONDITION.   Avamarie Crossley M.D on 12/08/2016 at 11:00 AM  Between 7am to 6pm - Pager - [phone] After 6pm go to [email or web address] - password [email or web address] [facility]  Office  [phone]  CC: Primary care physician; Tracie Harrier, MD  Note: This dictation was prepared with Dragon dictation along with smaller phrase technology. Any transcriptional errors that result from this process are unintentional.

## 2016-12-08 NOTE — Progress Notes (Signed)
PT Cancellation Note  Patient Details [name] MRN: [id number] DOB: [birth year]   Cancelled Treatment:    Reason Eval/Treat Not Completed: Medical issues which prohibited therapy.  Elevated troponin and pt is awaiting cardiology visit.  Will try later as time and pt allow.   Ramond Dial 12/08/2016, 10:55 AM   Mee Hives, PT MS Acute Rehab Dept. Number: [place] and [place]

## 2016-12-08 NOTE — Progress Notes (Signed)
Patient is admitted to room 259 with the diagnosis of acute on chronic CHF. Alert and oriented x 4. Tele box called to CCMD by Ninfa Meeker RN and Estill Bamberg NT. Skin assessment done with Alisa and noted 3 + pitting edema on bilateral LE. and MASD on bilateral  abdominal folds and  Groins. Also noted diabetic foot ulcer on sole of left foot. Patient was medicated with oxycodone 5 mg oral as needed for back pain. Will continue to monitor.

## 2016-12-09 DIAGNOSIS — I130 Hypertensive heart and chronic kidney disease with heart failure and stage 1 through stage 4 chronic kidney disease, or unspecified chronic kidney disease: Secondary | ICD-10-CM | POA: Diagnosis not present

## 2016-12-09 DIAGNOSIS — N3000 Acute cystitis without hematuria: Secondary | ICD-10-CM | POA: Diagnosis not present

## 2016-12-09 LAB — CBC
HCT: 30.2 % — ABNORMAL LOW (ref 35.0–47.0)
HEMOGLOBIN: 10 g/dL — AB (ref 12.0–16.0)
MCH: 29.7 pg (ref 26.0–34.0)
MCHC: 33.1 g/dL (ref 32.0–36.0)
MCV: 89.9 fL (ref 80.0–100.0)
PLATELETS: 101 10*3/uL — AB (ref 150–440)
RBC: 3.36 MIL/uL — AB (ref 3.80–5.20)
RDW: 16.2 % — ABNORMAL HIGH (ref 11.5–14.5)
WBC: 8.1 10*3/uL (ref 3.6–11.0)

## 2016-12-09 LAB — GLUCOSE, CAPILLARY
GLUCOSE-CAPILLARY: 145 mg/dL — AB (ref 65–99)
Glucose-Capillary: 176 mg/dL — ABNORMAL HIGH (ref 65–99)

## 2016-12-09 LAB — BASIC METABOLIC PANEL
Anion gap: 8 (ref 5–15)
BUN: 50 mg/dL — ABNORMAL HIGH (ref 6–20)
CHLORIDE: 99 mmol/L — AB (ref 101–111)
CO2: 30 mmol/L (ref 22–32)
CREATININE: 1.67 mg/dL — AB (ref 0.44–1.00)
Calcium: 8.5 mg/dL — ABNORMAL LOW (ref 8.9–10.3)
GFR calc Af Amer: 30 mL/min — ABNORMAL LOW (ref >60)
GFR calc non Af Amer: 26 mL/min — ABNORMAL LOW (ref >60)
GLUCOSE: 142 mg/dL — AB (ref 65–99)
POTASSIUM: 4 mmol/L (ref 3.5–5.1)
Sodium: 137 mmol/L (ref 135–145)

## 2016-12-09 MED ORDER — LEVOFLOXACIN 750 MG PO TABS: 750.0000 mg | ORAL_TABLET | ORAL | 0 refills | Status: AC

## 2016-12-09 MED ORDER — FUROSEMIDE 40 MG PO TABS: 40.0000 mg | ORAL_TABLET | Freq: Every day | ORAL | 0 refills | Status: DC

## 2016-12-09 MED ORDER — MUPIROCIN CALCIUM 2 % EX CREA
TOPICAL_CREAM | Freq: Every day | CUTANEOUS | Status: DC
  Administered 2016-12-09: 10:00:00 via TOPICAL
  Filled 2016-12-09: qty 15

## 2016-12-09 NOTE — Clinical Social Work Note (Signed)
Patient to be d/c'ed today to [facility].  Patient and family agreeable to plans will transport via ems RN to call report [phone].  Evette Cristal, MSW, [place]

## 2016-12-09 NOTE — Care Management (Signed)
Physical therapy recommended skilled nursing placement and patient declined.  CM was informed that patient and family have now changed their minds and now agreeable to placement.  Would not want to go to [facility]. Notified CSW.

## 2016-12-09 NOTE — Clinical Social Work Placement (Signed)
   CLINICAL SOCIAL WORK PLACEMENT  NOTE  Date:  12/09/2016  Patient Details  [name] MRN: [id number] Date of Birth: Nov 19, 1924  Clinical Social Work is seeking post-discharge placement for this patient at the [place] level of care (*CSW will initial, date and re-position this form in  chart as items are completed):  Yes   Patient/family provided with [place] Work Department's list of facilities offering this level of care within the geographic area requested by the patient (or if unable, by the patient's family).  Yes   Patient/family informed of their freedom to choose among providers that offer the needed level of care, that participate in Medicare, Medicaid or managed care program needed by the patient, have an available bed and are willing to accept the patient.  Yes   Patient/family informed of [facility]'s ownership interest in [facility] and [facility], as well as of the fact that they are under no obligation to receive care at these facilities.  PASRR submitted to EDS on 12/09/16     PASRR number received on       Existing PASRR number confirmed on 12/09/16     FL2 transmitted to all facilities in geographic area requested by pt/family on 12/09/16     FL2 transmitted to all facilities within larger geographic area on       Patient informed that his/her managed care company has contracts with or will negotiate with certain facilities, including the following:        Yes   Patient/family informed of bed offers received.  Patient chooses bed at [facility]     Physician recommends and patient chooses bed at      Patient to be transferred to [facility] on 12/09/16.  Patient to be transferred to facility by [facility]     Patient family notified on 12/09/16 of transfer.  Name of family member notified:  [name]     PHYSICIAN       Additional Comment:     _______________________________________________ [name], [place] 12/09/2016, 4:06 PM

## 2016-12-09 NOTE — NC FL2 (Signed)
[place] LEVEL OF CARE SCREENING TOOL     IDENTIFICATION  Patient [name] Birthdate: 04-Feb-1925 Sex: female Admission Date (Current Location): 12/07/2016  [place] and [place] Number:  Engineering geologist and Address:  [facility], [address], [place], [place] [address]      Provider Number: [id number]  Attending Physician Name and Address:  Bettey Costa, MD  Relative Name and Phone Number:  [name], [name] [phone]  [phone] or [name],[name] Daughter [phone]  [phone]     Current Level of Care: Hospital Recommended Level of Care: [place] Prior Approval Number:    Date Approved/Denied:   PASRR Number: [id number]  Discharge Plan: SNF    Current Diagnoses: Patient Active Problem List   Diagnosis Date Noted  . COPD (chronic obstructive pulmonary disease) ([place]) 12/07/2016  . UTI (urinary tract infection) 12/07/2016  . Ventral hernia without obstruction or gangrene   . Pneumonia 09/07/2015  . Hyponatremia 09/07/2015  . Acute respiratory failure ([place]) 09/01/2015  . Atrial fibrillation ([place]) 04/10/2015  . Essential hypertension 04/10/2015  . Diabetes ([place]) 04/10/2015  . Back pain 04/10/2015  . Acute on chronic combined systolic and diastolic CHF (congestive heart failure) ([place]) 03/21/2015  . Pulmonary nodule 11/10/2014    Orientation RESPIRATION BLADDER Height & Weight     Self, Time, Situation, Place  O2 (2L) Continent Weight: 179 lb 9.6 oz (81.5 kg) Height:  5\' 7"  (170.2 cm)  BEHAVIORAL SYMPTOMS/MOOD NEUROLOGICAL BOWEL NUTRITION STATUS      Continent Diet (Carb modified)  AMBULATORY STATUS COMMUNICATION OF NEEDS Skin   Limited Assist Verbally Surgical wounds                       Personal Care Assistance Level of Assistance  Bathing, Feeding, Dressing Bathing Assistance: Limited assistance Feeding assistance: Independent Dressing Assistance: Limited  assistance     Functional Limitations Info  Sight, Hearing, Speech Sight Info: Adequate Hearing Info: Adequate Speech Info: Adequate    SPECIAL CARE FACTORS FREQUENCY  PT (By licensed PT)     PT Frequency: 5x a week               Contractures Contractures Info: Not present    Additional Factors Info  Code Status, Allergies, Psychotropic, Insulin Sliding Scale Code Status Info: Full Code Allergies Info: CEFTRIAXONE, CEFUROXIME, CEPHALOSPORINS, CODEINE, EPINEPHRINE, MORPHINE AND RELATED, BUPRENORPHINE HCL, CIPROFLOXACIN, LATEX, LIDOCAINE, PROCAINE  Psychotropic Info: escitalopram (LEXAPRO) tablet 10 mg Insulin Sliding Scale Info: insulin aspart (novoLOG) injection 0-9 Units 3x a day with meals       Current Medications (12/09/2016):  This is the current hospital active medication list Current Facility-Administered Medications  Medication Dose Route Frequency Provider Last Rate Last Dose  . acetaminophen (TYLENOL) tablet 650 mg  650 mg Oral Q6H PRN Lance Coon, MD       Or  . acetaminophen (TYLENOL) suppository 650 mg  650 mg Rectal Q6H PRN Lance Coon, MD      . apixaban Arne Cleveland) tablet 5 mg  5 mg Oral BID Lance Coon, MD   5 mg at 12/09/16 0841  . carvedilol (COREG) tablet 6.25 mg  6.25 mg Oral BID WC Lance Coon, MD   6.25 mg at 12/09/16 0841  . escitalopram (LEXAPRO) tablet 10 mg  10 mg Oral Daily Lance Coon, MD   10 mg at 12/09/16 0843  . fentaNYL (DURAGESIC - dosed mcg/hr) 50 mcg  50 mcg Transdermal Q48H Willis,  Shanon Brow, MD   50 mcg at 12/08/16 0348  . furosemide (LASIX) injection 40 mg  40 mg Intravenous Daily Bettey Costa, MD   40 mg at 12/08/16 1754  . insulin aspart (novoLOG) injection 0-9 Units  0-9 Units Subcutaneous TID AC & HS Lance Coon, MD   2 Units at 12/09/16 1237  . levofloxacin (LEVAQUIN) IVPB 750 mg  750 mg Intravenous Q48H Lance Coon, MD   Stopped at 12/08/16 [phone]  . losartan (COZAAR) tablet 25 mg  25 mg Oral Daily Lance Coon, MD   25 mg  at 12/09/16 0841  . mometasone-formoterol (DULERA) 200-5 MCG/ACT inhaler 2 puff  2 puff Inhalation BID Lance Coon, MD   2 puff at 12/09/16 0844  . mupirocin cream (BACTROBAN) 2 %   Topical Daily Mody, Sital, MD      . ondansetron (ZOFRAN) tablet 4 mg  4 mg Oral Q6H PRN Lance Coon, MD       Or  . ondansetron [facility]) injection 4 mg  4 mg Intravenous Q6H PRN Lance Coon, MD   4 mg at 12/09/16 0441  . oxyCODONE (Oxy IR/ROXICODONE) immediate release tablet 5-10 mg  5-10 mg Oral Q4H PRN Lance Coon, MD   5 mg at 12/08/16 0200  . pantoprazole (PROTONIX) EC tablet 40 mg  40 mg Oral Daily Lance Coon, MD   40 mg at 12/09/16 0840  . rOPINIRole (REQUIP) tablet 0.5 mg  0.5 mg Oral BID Lance Coon, MD   0.5 mg at 12/09/16 0841  . spironolactone (ALDACTONE) tablet 25 mg  25 mg Oral Daily Lance Coon, MD   25 mg at 12/09/16 4481     Discharge Medications: Please see discharge summary for a list of discharge medications.  Relevant Imaging Results:  Relevant Lab Results:   Additional Information SSN [id number]  [name], [place]

## 2016-12-09 NOTE — Clinical Social Work Note (Signed)
Clinical Social Work Assessment  Patient Details  [name] MRN: [id number] Date of Birth: May 23, 1925  Date of referral:  12/09/16               Reason for consult:  Facility Placement                Permission sought to share information with:  Family Supports, Customer service manager Permission granted to share information::  Yes, Verbal Permission Granted  Name::     [place], Barren [phone]  [phone] or [name],[name] Daughter [phone]  [phone]   Agency::  SNF admissions  Relationship::     Contact Information:     Housing/Transportation Living arrangements for the past 2 months:  Single Family Home Source of Information:  Patient, Spouse, Adult Children Patient Interpreter Needed:  None Criminal Activity/Legal Involvement Pertinent to Current Situation/Hospitalization:  No - Comment as needed Significant Relationships:  Adult Children, Spouse Lives with:  Spouse Do you feel safe going back to the place where you live?  No Need for family participation in patient care:  No (Coment)  Care giving concerns:  Patient and family feel she needs short term rehab before she is able to return back home.   Social Worker assessment / plan:  Patient is an 83 year old female who is alert and oriented x4.  Patient is married and lives with her husband, patient has been to rehab in the past, but it has been a couple of years.  Patient was explained what the process is and how insurance will pay for her stay at SNF.  Patient and family were explained what to expect at SNF and how it is determined the amount of therapy patient gets.  Patient and family requested [facility], but CSW confirmed there are no beds available.  CSW was given permission to begin bed search in [place].  Employment status:  Retired Nurse, adult PT Recommendations:  [place] / Referral to community resources:   [place]  Patient/Family's Response to care:  Patient and family agreeable to going to SNF for short term rehab.  Patient/Family's Understanding of and Emotional Response to Diagnosis, Current Treatment, and Prognosis:  Patient and family are hopeful patient will not have to be at SNF very long.  Emotional Assessment Appearance:  Appears stated age Attitude/Demeanor/Rapport:    Affect (typically observed):  Pleasant, Calm, Appropriate Orientation:  Oriented to Place, Oriented to Self, Oriented to  Time, Oriented to Situation Alcohol / Substance use:  Not Applicable Psych involvement (Current and /or in the community):  No (Comment)  Discharge Needs  Concerns to be addressed:  Lack of Support Readmission within the last 30 days:  No Current discharge risk:  None Barriers to Discharge:  No Barriers Identified   [name], LCSWA 12/09/2016, 3:48 PM

## 2016-12-09 NOTE — Progress Notes (Addendum)
Anticoagulation monitoring  83 yo female on apixaban 5 mg PO BID for Afib, which is her PTA dose.  SCr 1.39 >>1.67 Wt 81.5 kg Based on SCr, pt's dose would need to be decreased. Pt's baseline SCr per cards note is 1.29-1.39. Spoke with Dr. Benjie Karvonen who stated that pt may have been overdiuresed; anticipate that SCr will improve as furosemide dose was decreased on discharge. Pt with follow up plans as well. No changes to apixaban dose at this time (d/c orders).

## 2016-12-09 NOTE — Progress Notes (Signed)
Patient discharged via stretcher and EMS. IV removed and catheter intact. All discharge instructions given and patient verbalizes understanding. Tele removed and returned. No prescriptions given to patient No distress noted.

## 2016-12-09 NOTE — Discharge Summary (Addendum)
[facility] [place] [name]    MR#:  [id number]  DATE OF BIRTH:  01-Jun-1925  DATE OF ADMISSION:  12/07/2016 ADMITTING PHYSICIAN: Lance Coon, MD  DATE OF DISCHARGE: 12/09/2016  PRIMARY CARE PHYSICIAN: Tracie Harrier, MD    ADMISSION DIAGNOSIS:  Acute cystitis without hematuria [N30.00] Acute respiratory failure with hypoxia (HCC) [J96.01] Acute on chronic congestive heart failure, unspecified heart failure type ([place]) [I50.9]  DISCHARGE DIAGNOSIS:  Principal Problem:   Acute on chronic combined systolic and diastolic CHF (congestive heart failure) (HCC) Active Problems:   Atrial fibrillation (HCC)   Essential hypertension   Diabetes (HCC)   COPD (chronic obstructive pulmonary disease) (HCC)   UTI (urinary tract infection)   SECONDARY DIAGNOSIS:   Past Medical History:  Diagnosis Date  . Cancer ([place])    skin ca  . CHF (congestive heart failure) ([place])   . Chronic back pain   . COPD (chronic obstructive pulmonary disease) ([place])   . Diabetes mellitus without complication ([place])   . Hypertension   . Poor perfusion of leg     HOSPITAL COURSE:  83 year old female with combined systolic and diastolic heart failure who presents with shortness of breath.  1. Acute on chronic hypoxic respiratory failure in the setting of acute exacerbation of CHF. Patient's oxygen has been weaned to 2 L.. She does wear oxygen at night.  2. Acute on chronic combined systolic and diastolic heart failure: Patient has diuresed well. She will resume Lasix however she will be on Lasix 40 mg daily for now. She will start this tomorrow. She is referred to CHF clinic.  She has Follow-up with [facility] cardiology  3. Escherichia coli Urinary tract infection: Continue Levaquin. She has an allergy listed to cephalosporins. Her PCP should follow up on final cultures  4. Atrial fibrillation: Continue Coreg for heart rate control and Eliquis  5.  Diabetes: Continue outpatient regimen with ADA diet 6. COPD without exacerbation: Continue nebulizers Physical therapy is recommended skilled nursing facility  7.Nonhealing neuropathic ulcer to left plantar foot, great toe metatarsal head: Cleanse wound to left plantar foot with NS and pat dry.  Mupirocin cream to wound bed.  Cover with silicone border foam dressing.  Reapply cream daily.  Replace silicone foam every 3 days and PRN soilage.   DISCHARGE CONDITIONS AND DIET:   Stable for discharge on heart healthy diabetic diet  CONSULTS OBTAINED:  Treatment Team:  Teodoro Spray, MD  DRUG ALLERGIES:   Allergies  Allergen Reactions  . Ceftriaxone Swelling  . Cefuroxime Hives  . Cephalosporins Swelling and Other (See Comments)    Reaction:  Fainting/dry mouth   . Codeine Nausea And Vomiting  . Epinephrine Other (See Comments)    Reaction:  Fainting   . Morphine And Related Other (See Comments)    Reaction:  GI upset   . Buprenorphine Hcl Nausea And Vomiting  . Ciprofloxacin Itching, Swelling and Rash  . Latex Rash  . Lidocaine Rash  . Procaine Rash    DISCHARGE MEDICATIONS:   Current Discharge Medication List    START taking these medications   Details  levofloxacin (LEVAQUIN) 750 MG tablet Take 1 tablet (750 mg total) by mouth every other day. Qty: 2 tablet, Refills: 0      CONTINUE these medications which have CHANGED   Details  furosemide (LASIX) 40 MG tablet Take 1 tablet (40 mg total) by mouth daily. Pt alternates with the 80mg  tablet. Qty: 30  tablet, Refills: 0      CONTINUE these medications which have NOT CHANGED   Details  albuterol (PROVENTIL HFA;VENTOLIN HFA) 108 (90 Base) MCG/ACT inhaler Inhale 1-2 puffs into the lungs every 6 (six) hours as needed for wheezing or shortness of breath. Qty: 1 Inhaler, Refills: 0    apixaban (ELIQUIS) 5 MG TABS tablet Take 5 mg by mouth 2 (two) times daily.    baclofen (LIORESAL) 10 MG tablet Take 10 mg by mouth 3  (three) times daily.    budesonide-formoterol (SYMBICORT) 160-4.5 MCG/ACT inhaler Inhale 2 puffs into the lungs 2 (two) times daily. Qty: 1 Inhaler, Refills: 12    carvedilol (COREG) 6.25 MG tablet Take 6.25 mg by mouth 2 (two) times daily with a meal.     cholecalciferol (VITAMIN D) 1000 units tablet Take 1,000 Units by mouth daily.    docusate sodium (COLACE) 100 MG capsule Take 100 mg by mouth 2 (two) times daily.     escitalopram (LEXAPRO) 10 MG tablet Take 10 mg by mouth daily.    fentaNYL (DURAGESIC - DOSED MCG/HR) 50 MCG/HR Place 50 mcg onto the skin every other day.     ferrous sulfate 325 (65 FE) MG tablet Take 325 mg by mouth daily with breakfast.    glimepiride (AMARYL) 4 MG tablet Take 4 mg by mouth 2 (two) times daily after a meal.    insulin aspart (NOVOLOG FLEXPEN) 100 UNIT/ML FlexPen Inject 5 Units into the skin daily before lunch. IF blood sugar is greater than 200 Qty: 15 mL, Refills: 11    losartan (COZAAR) 25 MG tablet Take 25 mg by mouth daily.    magnesium oxide (MAG-OX) 400 MG tablet Take 400 mg by mouth daily.    Melatonin 1 MG TABS Take 1 mg by mouth at bedtime.    metolazone (ZAROXOLYN) 2.5 MG tablet Take 2.5 mg by mouth 2 (two) times a week. Pt takes on Monday and Friday.    oxyCODONE (OXY IR/ROXICODONE) 5 MG immediate release tablet Take 5-10 mg by mouth every 4 (four) hours as needed for severe pain.     pantoprazole (PROTONIX) 40 MG tablet Take 40 mg by mouth daily.    potassium chloride (KLOR-CON) 20 MEQ packet Take by mouth 2 (two) times daily.    rOPINIRole (REQUIP) 0.5 MG tablet Take 0.5 mg by mouth 2 (two) times daily.    sitaGLIPtin (JANUVIA) 100 MG tablet Take 100 mg by mouth daily.    spironolactone (ALDACTONE) 25 MG tablet Take 25 mg by mouth daily.     vitamin B-12 (CYANOCOBALAMIN) 1000 MCG tablet Take 1,000 mcg by mouth daily.    vitamin C (ASCORBIC ACID) 500 MG tablet Take 500 mg by mouth daily.    Vitamin D, Ergocalciferol,  (DRISDOL) 50000 UNITS CAPS capsule Take 50,000 Units by mouth every 7 (seven) days. Pt takes on Thursday.      STOP taking these medications     sulfamethoxazole-trimethoprim (BACTRIM DS,SEPTRA DS) 800-160 MG tablet      doxycycline (VIBRAMYCIN) 50 MG capsule      mupirocin ointment (BACTROBAN) 2 %      predniSONE (STERAPRED UNI-PAK 21 TAB) 10 MG (21) TBPK tablet           Today   CHIEF COMPLAINT:  Patient doing well this morning. Does not feel short of breath. Denies chest pain   VITAL SIGNS:  Blood pressure (!) 117/52, pulse 76, temperature 99.4 F (37.4 C), temperature source Oral, resp. rate 18,  height 5\' 7"  (1.702 m), weight 81.5 kg (179 lb 9.6 oz), SpO2 95 %.   REVIEW OF SYSTEMS:  Review of Systems  Constitutional: Negative.  Negative for chills, fever and malaise/fatigue.  HENT: Negative.  Negative for ear discharge, ear pain, hearing loss, nosebleeds and sore throat.   Eyes: Negative.  Negative for blurred vision and pain.  Respiratory: Negative.  Negative for cough, hemoptysis, shortness of breath and wheezing.   Cardiovascular: Negative.  Negative for chest pain, palpitations and leg swelling.  Gastrointestinal: Negative.  Negative for abdominal pain, blood in stool, diarrhea, nausea and vomiting.  Genitourinary: Negative.  Negative for dysuria.  Musculoskeletal: Negative.  Negative for back pain.  Skin: Negative.   Neurological: Negative for dizziness, tremors, speech change, focal weakness, seizures and headaches.  Endo/Heme/Allergies: Negative.  Does not bruise/bleed easily.  Psychiatric/Behavioral: Negative.  Negative for depression, hallucinations and suicidal ideas.     PHYSICAL EXAMINATION:  GENERAL:  83 y.o.-year-old patient lying in the bed with no acute distress.  NECK:  Supple, no jugular venous distention. No thyroid enlargement, no tenderness.  LUNGS: Normal breath sounds bilaterally, no wheezing, rales,rhonchi  No use of accessory muscles of  respiration.  CARDIOVASCULAR: S1, S2 normal. No murmurs, rubs, or gallops.  ABDOMEN: Soft, non-tender, non-distended. Bowel sounds present. No organomegaly or mass.  EXTREMITIES: No pedal edema, cyanosis, or clubbing.  PSYCHIATRIC: The patient is alert and oriented x 3.  SKIN: No obvious rash, lesion, or ulcer.   DATA REVIEW:   CBC  Recent Labs Lab 12/09/16 0443  WBC 8.1  HGB 10.0*  HCT 30.2*  PLT 101*    Chemistries   Recent Labs Lab 12/07/16 2038  12/09/16 0443  NA 134*  < > 137  K 4.9  < > 4.0  CL 101  < > 99*  CO2 23  < > 30  GLUCOSE 267*  < > 142*  BUN 37*  < > 50*  CREATININE 1.29*  < > 1.67*  CALCIUM 8.6*  < > 8.5*  AST 25  --   --   ALT 16  --   --   ALKPHOS 70  --   --   BILITOT 1.2  --   --   < > = values in this interval not displayed.  Cardiac Enzymes  Recent Labs Lab 12/08/16 0149 12/08/16 0754 12/08/16 1343  TROPONINI 0.06* 0.05* 0.05*    Microbiology Results  @MICRORSLT48 @  RADIOLOGY:  Dg Chest 2 View  Result Date: 12/07/2016 CLINICAL DATA:  Symptoms of UTI for 3 days. EXAM: CHEST  2 VIEW COMPARISON:  12/06/2016 FINDINGS: Cardiac pacemaker, stable. Enlarged cardiac silhouette. Mediastinal contours appear intact. Calcific atherosclerotic disease of the aorta. There is no evidence of focal airspace consolidation, pleural effusion or pneumothorax. Chronic coarsening of the interstitium. Osseous structures are without acute abnormality. Posterior spinal fusion with chronic compression deformity of T11 vertebral body. Soft tissues are grossly normal. IMPRESSION: Enlarged cardiac silhouette. Chronic coarsening of the interstitium. Calcific atherosclerotic disease of the aorta. Electronically Signed   By: Fidela Salisbury M.D.   On: 12/07/2016 22:12      Current Discharge Medication List    START taking these medications   Details  levofloxacin (LEVAQUIN) 750 MG tablet Take 1 tablet (750 mg total) by mouth every other day. Qty: 2 tablet,  Refills: 0      CONTINUE these medications which have CHANGED   Details  furosemide (LASIX) 40 MG tablet Take 1 tablet (40 mg total) by  mouth daily. Pt alternates with the 80mg  tablet. Qty: 30 tablet, Refills: 0      CONTINUE these medications which have NOT CHANGED   Details  albuterol (PROVENTIL HFA;VENTOLIN HFA) 108 (90 Base) MCG/ACT inhaler Inhale 1-2 puffs into the lungs every 6 (six) hours as needed for wheezing or shortness of breath. Qty: 1 Inhaler, Refills: 0    apixaban (ELIQUIS) 5 MG TABS tablet Take 5 mg by mouth 2 (two) times daily.    baclofen (LIORESAL) 10 MG tablet Take 10 mg by mouth 3 (three) times daily.    budesonide-formoterol (SYMBICORT) 160-4.5 MCG/ACT inhaler Inhale 2 puffs into the lungs 2 (two) times daily. Qty: 1 Inhaler, Refills: 12    carvedilol (COREG) 6.25 MG tablet Take 6.25 mg by mouth 2 (two) times daily with a meal.     cholecalciferol (VITAMIN D) 1000 units tablet Take 1,000 Units by mouth daily.    docusate sodium (COLACE) 100 MG capsule Take 100 mg by mouth 2 (two) times daily.     escitalopram (LEXAPRO) 10 MG tablet Take 10 mg by mouth daily.    fentaNYL (DURAGESIC - DOSED MCG/HR) 50 MCG/HR Place 50 mcg onto the skin every other day.     ferrous sulfate 325 (65 FE) MG tablet Take 325 mg by mouth daily with breakfast.    glimepiride (AMARYL) 4 MG tablet Take 4 mg by mouth 2 (two) times daily after a meal.    insulin aspart (NOVOLOG FLEXPEN) 100 UNIT/ML FlexPen Inject 5 Units into the skin daily before lunch. IF blood sugar is greater than 200 Qty: 15 mL, Refills: 11    losartan (COZAAR) 25 MG tablet Take 25 mg by mouth daily.    magnesium oxide (MAG-OX) 400 MG tablet Take 400 mg by mouth daily.    Melatonin 1 MG TABS Take 1 mg by mouth at bedtime.    metolazone (ZAROXOLYN) 2.5 MG tablet Take 2.5 mg by mouth 2 (two) times a week. Pt takes on Monday and Friday.    oxyCODONE (OXY IR/ROXICODONE) 5 MG immediate release tablet Take 5-10 mg  by mouth every 4 (four) hours as needed for severe pain.     pantoprazole (PROTONIX) 40 MG tablet Take 40 mg by mouth daily.    potassium chloride (KLOR-CON) 20 MEQ packet Take by mouth 2 (two) times daily.    rOPINIRole (REQUIP) 0.5 MG tablet Take 0.5 mg by mouth 2 (two) times daily.    sitaGLIPtin (JANUVIA) 100 MG tablet Take 100 mg by mouth daily.    spironolactone (ALDACTONE) 25 MG tablet Take 25 mg by mouth daily.     vitamin B-12 (CYANOCOBALAMIN) 1000 MCG tablet Take 1,000 mcg by mouth daily.    vitamin C (ASCORBIC ACID) 500 MG tablet Take 500 mg by mouth daily.    Vitamin D, Ergocalciferol, (DRISDOL) 50000 UNITS CAPS capsule Take 50,000 Units by mouth every 7 (seven) days. Pt takes on Thursday.      STOP taking these medications     sulfamethoxazole-trimethoprim (BACTRIM DS,SEPTRA DS) 800-160 MG tablet      doxycycline (VIBRAMYCIN) 50 MG capsule      mupirocin ointment (BACTROBAN) 2 %      predniSONE (STERAPRED UNI-PAK 21 TAB) 10 MG (21) TBPK tablet            Management plans discussed with the patient and she is in agreement. Stable for discharge   Patient should follow up with PCP  CODE STATUS:     Code Status Orders  Start     Ordered   12/08/16 0107  Full code  Continuous     12/08/16 0106    Code Status History    Date Active Date Inactive Code Status Order ID Comments User Context   09/07/2015  4:41 AM 09/13/2015  7:55 PM Full Code [id number]  Saundra Shelling, MD Inpatient   09/01/2015  3:33 PM 09/03/2015  7:07 PM Full Code [id number]  Epifanio Lesches, MD ED   03/21/2015  6:26 AM 03/22/2015  7:39 PM Full Code [id number]  Harrie Foreman, MD Inpatient      TOTAL TIME TAKING CARE OF THIS PATIENT: 37 minutes.    Note: This dictation was prepared with Dragon dictation along with smaller phrase technology. Any transcriptional errors that result from this process are unintentional.  Joleah Kosak M.D on 12/09/2016 at 11:07 AM  Between 7am to  6pm - Pager - [phone] After 6pm go to [email or web address] - password [email or web address]  Office  [phone]  CC: Primary care physician; Tracie Harrier, MD

## 2016-12-09 NOTE — Evaluation (Signed)
Physical Therapy Evaluation Patient Details [name] MRN: [id number] DOB: 11/20/1924 Today's Date: 12/09/2016   History of Present Illness  Pt is an 83 y.o.femalewho presents with Malaise, increased shortness of breath. Patient was recently started on antibiotics for urinary tract infection, she then developed progressive shortness of breath along with general malaise. This progressed over the past couple of days and pt came to the ED for evaluation. She was found to still have a UTI here, and seems to be in heart failure exacerbation. Hospitalists were called for admission.  Per cardiology note: Pt had borderline troponin elevation which was consistent with demand ischemia. There is no evidence of acute coronary syndrome. Pt ruled out for a myocardial infarction. Urinalysis revealed urinary tract infection. Urine culture is pending. BNP is 537. Chest x-ray revealed no significant pulmonary edema. Echocardiogram revealed ejection fraction of 45-50% with mild global hypokinesis. She has a history of chronic atrial fibrillation as well as high-grade heart block and has a permanent pacemaker in place. She has been on carvedilol at 6.25 mg twice daily for rate control. She has been on Xarelto at 15 mg daily for anticoagulation. She has been doing a lot better since admission. She is anxious to go home. Her electrocardiogram revealed ventricular paced rhythm.    Clinical Impression  Pt presents with significant deficits in strength, transfers, mobility, gait, balance, and activity tolerance.  Pt required min A with bed mobility tasks and Min A with sit to/from stand from elevated EOB.  Pt required Min A to prevent posterior LOB in standing and was only able to take 1-2 small side steps at EOB before requiring to return to sitting.  SpO2 on 2LO2/min dropped from baseline of 95% to 91% after ambulation but returned to near baseline in <30 sec upon sitting with cues for PLB.  HR remained in the  70s throughout session.  Pt will benefit from PT services in a SNF setting upon discharge to address above deficits for decreased caregiver assistance and eventual return to prior living setting.        Follow Up Recommendations SNF    Equipment Recommendations  None recommended by PT    Recommendations for Other Services       Precautions / Restrictions Precautions Precautions: Fall Restrictions Weight Bearing Restrictions: No      Mobility  Bed Mobility Overal bed mobility: Needs Assistance Bed Mobility: Supine to Sit;Sit to Supine     Supine to sit: Min assist Sit to supine: Min assist      Transfers Overall transfer level: Needs assistance Equipment used: Rolling walker (2 wheeled) Transfers: Sit to/from Stand Sit to Stand: Min assist         General transfer comment: Mod verbal cues for general sequencing required  Ambulation/Gait Ambulation/Gait assistance: Min assist Ambulation Distance (Feet): 1 Feet Assistive device: Rolling walker (2 wheeled) Gait Pattern/deviations: Step-to pattern   Gait velocity interpretation: <1.8 ft/sec, indicative of risk for recurrent falls General Gait Details: Pt able to take 1-2 small side steps at EOB with min A to prevent posterior LOB  Stairs            Wheelchair Mobility    Modified Rankin (Stroke Patients Only)       Balance Overall balance assessment: Needs assistance Sitting-balance support: Feet supported;Bilateral upper extremity supported Sitting balance-Leahy Scale: Fair Sitting balance - Comments: Some posterior lean but improved with cues   Standing balance support: Bilateral upper extremity supported Standing balance-Leahy Scale: Poor  Pertinent Vitals/Pain Pain Assessment: 0-10 Pain Score: 5  Pain Location: chronic low back pain Pain Descriptors / Indicators: Aching Pain Intervention(s): Premedicated before session;Monitored during session     [place] expects to be discharged to:: Private residence Living Arrangements: Spouse/significant other Available Help at Discharge: Family;Available PRN/intermittently Type of Home: House Home Access: Stairs to enter Entrance Stairs-Rails: Right Entrance Stairs-Number of Steps: 4 Home Layout: One level Home Equipment: Walker - 2 wheels;Other (comment) (3WW) Additional Comments: Pt has a ramp at one entrance    Prior Function Level of Independence: Needs assistance   Gait / Transfers Assistance Needed: SBA with amb in home with RW and with 3WW in community with pt having personal care attendent 3 hrs in AM and 3 hrs in PM, no fall history  ADL's / Homemaking Assistance Needed: SBA with bathing from personal care attendent         Hand Dominance   Dominant Hand: Right    Extremity/Trunk Assessment   Upper Extremity Assessment Upper Extremity Assessment: Generalized weakness    Lower Extremity Assessment Lower Extremity Assessment: Generalized weakness       Communication   Communication: No difficulties  Cognition Arousal/Alertness: Awake/alert Behavior During Therapy: WFL for tasks assessed/performed Overall Cognitive Status: Within Functional Limits for tasks assessed                                        General Comments      Exercises Total Joint Exercises Ankle Circles/Pumps: Strengthening;Both;10 reps Quad Sets: Strengthening;Both;10 reps Gluteal Sets: Strengthening;Both;10 reps Heel Slides: AROM;Both;5 reps Hip ABduction/ADduction: AROM;Both;10 reps Straight Leg Raises: AAROM;Both;10 reps Long Arc Quad: AROM;Both;10 reps   Assessment/Plan    PT Assessment Patient needs continued PT services  PT Problem List Decreased strength;Decreased activity tolerance;Decreased balance       PT Treatment Interventions DME instruction;Gait training;Functional mobility training;Neuromuscular re-education;Balance  training;Therapeutic exercise;Therapeutic activities;Patient/family education    PT Goals (Current goals can be found in the Care Plan section)  Acute Rehab PT Goals Patient Stated Goal: To get stronger and be back to normal PT Goal Formulation: With patient Time For Goal Achievement: 12/22/16 Potential to Achieve Goals: Fair    Frequency Min 2X/week   Barriers to discharge        Co-evaluation               AM-PAC PT "6 Clicks" Daily Activity  Outcome Measure Difficulty turning over in bed (including adjusting bedclothes, sheets and blankets)?: Total Difficulty moving from lying on back to sitting on the side of the bed? : Total Difficulty sitting down on and standing up from a chair with arms (e.g., wheelchair, bedside commode, etc,.)?: Total Help needed moving to and from a bed to chair (including a wheelchair)?: Total Help needed walking in hospital room?: Total Help needed climbing 3-5 steps with a railing? : Total 6 Click Score: 6    End of Session Equipment Utilized During Treatment: Gait belt;Oxygen Activity Tolerance: Patient limited by fatigue Patient left: in bed;with bed alarm set Nurse Communication: Mobility status PT Visit Diagnosis: Difficulty in walking, not elsewhere classified (R26.2);Muscle weakness (generalized) (M62.81)    Time: 1062-6948 PT Time Calculation (min) (ACUTE ONLY): 34 min   Charges:   PT Evaluation $PT Eval Low Complexity: 1 Procedure PT Treatments $Therapeutic Exercise: 8-22 mins   PT G Codes:        D.  Scott Jarrick Fjeld PT, DPT 12/09/16, 10:41 AM

## 2016-12-09 NOTE — Consult Note (Addendum)
[facility] Nurse wound consult note Reason for Consult:Nonhealing neuropathic ulcer to left plantar foot, great toe metatarsal head.  Wound type:neuropathic.  Sees Dr Elvina Mattes for this  Pressure Injury POA: Yes Measurement: Length_0.5 cm x 0.4 cm x 0.2 cm with a granular appearance to wound base.  Some moderate hyperkeratosis is noted. There is no necrotic tissue. No erythema and/or inflammation, no purulent drainage   Periwound: Arch collapse noted to left foot.  Dressing procedure/placement/frequency: Cleanse wound to left plantar foot with NS and pat dry.  Mupirocin cream to wound bed.  Cover with silicone border foam dressing.  Reapply cream daily.  Replace silicone foam every 3 days and PRN soilage.  Will not follow at this time.  Please re-consult if needed.  Domenic Moras RN BSN [place] Pager [phone]

## 2016-12-09 NOTE — Care Management Important Message (Signed)
Important Message  Patient Details  [name] MRN: [id number] Date of Birth: 05/09/1925   Medicare Important Message Given:  Yes    Katrina Stack, RN 12/09/2016, 1:32 PM

## 2016-12-09 NOTE — Progress Notes (Signed)
Report called to [place] at [facility]. EMS notified for transport. Will get patient ready and continue to monitor.

## 2016-12-10 LAB — URINE CULTURE: Culture: >=100000 — AB

## 2016-12-13 ENCOUNTER — Other Ambulatory Visit
Admission: RE | Admit: 2016-12-13 | Discharge: 2016-12-13 | Disposition: A | Discharge status: Home / Self Care | Payer: Medicare Other | Source: Ambulatory Visit | Attending: Family Medicine | Admitting: Family Medicine

## 2016-12-13 DIAGNOSIS — I509 Heart failure, unspecified: Secondary | ICD-10-CM | POA: Diagnosis present

## 2016-12-13 LAB — BRAIN NATRIURETIC PEPTIDE: B NATRIURETIC PEPTIDE 5: 264 pg/mL — AB (ref 0.0–100.0)

## 2016-12-16 ENCOUNTER — Ambulatory Visit: Payer: Medicare Other | Admitting: Family

## 2016-12-27 NOTE — Progress Notes (Signed)
   12/09/16 1037  PT Time Calculation  PT Start Time (ACUTE ONLY) 0900  PT Stop Time (ACUTE ONLY) 0934  PT Time Calculation (min) (ACUTE ONLY) 34 min  PT G-Codes **NOT FOR INPATIENT CLASS**  Functional Assessment Tool Used AM-PAC 6 Clicks Basic Mobility  Functional Limitation Mobility: Walking and moving around  Mobility: Walking and Moving Around Current Status (T0354) CN  Mobility: Walking and Moving Around Goal Status (S5681) CJ  PT General Charges  $$ ACUTE PT VISIT 1 Procedure  PT Evaluation  $PT Eval Low Complexity 1 Procedure  PT Treatments  $Therapeutic Exercise 8-22 mins    Late entry g-codes added after review of initial evaluation/documentation by Cy Blamer, PT.   Reyes Ivan. Owens Shark, PT, DPT, NCS 12/27/16, 8:40 AM [phone]

## 2017-04-13 ENCOUNTER — Emergency Department: Payer: Medicare Other

## 2017-04-13 ENCOUNTER — Inpatient Hospital Stay
Admission: EM | Admit: 2017-04-13 | Discharge: 2017-04-18 | DRG: 871 | Disposition: A | Discharge status: Skilled Nursing Facility | Payer: Medicare Other | Attending: Internal Medicine | Admitting: Internal Medicine

## 2017-04-13 DIAGNOSIS — A4151 Sepsis due to Escherichia coli [E. coli]: Secondary | ICD-10-CM | POA: Diagnosis not present

## 2017-04-13 DIAGNOSIS — I4891 Unspecified atrial fibrillation: Secondary | ICD-10-CM | POA: Diagnosis present

## 2017-04-13 DIAGNOSIS — E119 Type 2 diabetes mellitus without complications: Secondary | ICD-10-CM | POA: Diagnosis present

## 2017-04-13 DIAGNOSIS — Z85828 Personal history of other malignant neoplasm of skin: Secondary | ICD-10-CM

## 2017-04-13 DIAGNOSIS — A419 Sepsis, unspecified organism: Secondary | ICD-10-CM | POA: Diagnosis present

## 2017-04-13 DIAGNOSIS — Z7984 Long term (current) use of oral hypoglycemic drugs: Secondary | ICD-10-CM

## 2017-04-13 DIAGNOSIS — F0390 Unspecified dementia without behavioral disturbance: Secondary | ICD-10-CM | POA: Diagnosis present

## 2017-04-13 DIAGNOSIS — Z950 Presence of cardiac pacemaker: Secondary | ICD-10-CM

## 2017-04-13 DIAGNOSIS — R609 Edema, unspecified: Secondary | ICD-10-CM

## 2017-04-13 DIAGNOSIS — I509 Heart failure, unspecified: Secondary | ICD-10-CM | POA: Diagnosis present

## 2017-04-13 DIAGNOSIS — G8929 Other chronic pain: Secondary | ICD-10-CM | POA: Diagnosis present

## 2017-04-13 DIAGNOSIS — M549 Dorsalgia, unspecified: Secondary | ICD-10-CM | POA: Diagnosis present

## 2017-04-13 DIAGNOSIS — I10 Essential (primary) hypertension: Secondary | ICD-10-CM | POA: Diagnosis present

## 2017-04-13 DIAGNOSIS — Z794 Long term (current) use of insulin: Secondary | ICD-10-CM

## 2017-04-13 DIAGNOSIS — Z885 Allergy status to narcotic agent status: Secondary | ICD-10-CM

## 2017-04-13 DIAGNOSIS — J449 Chronic obstructive pulmonary disease, unspecified: Secondary | ICD-10-CM | POA: Diagnosis present

## 2017-04-13 DIAGNOSIS — Z91040 Latex allergy status: Secondary | ICD-10-CM

## 2017-04-13 DIAGNOSIS — R0602 Shortness of breath: Secondary | ICD-10-CM

## 2017-04-13 DIAGNOSIS — I110 Hypertensive heart disease with heart failure: Secondary | ICD-10-CM | POA: Diagnosis present

## 2017-04-13 DIAGNOSIS — J189 Pneumonia, unspecified organism: Secondary | ICD-10-CM | POA: Diagnosis present

## 2017-04-13 DIAGNOSIS — Z888 Allergy status to other drugs, medicaments and biological substances status: Secondary | ICD-10-CM

## 2017-04-13 DIAGNOSIS — Z7901 Long term (current) use of anticoagulants: Secondary | ICD-10-CM

## 2017-04-13 DIAGNOSIS — J440 Chronic obstructive pulmonary disease with acute lower respiratory infection: Secondary | ICD-10-CM | POA: Diagnosis present

## 2017-04-13 DIAGNOSIS — Z79899 Other long term (current) drug therapy: Secondary | ICD-10-CM

## 2017-04-13 DIAGNOSIS — Z881 Allergy status to other antibiotic agents status: Secondary | ICD-10-CM

## 2017-04-13 DIAGNOSIS — L03116 Cellulitis of left lower limb: Secondary | ICD-10-CM | POA: Diagnosis present

## 2017-04-13 DIAGNOSIS — Z7951 Long term (current) use of inhaled steroids: Secondary | ICD-10-CM

## 2017-04-13 DIAGNOSIS — I482 Chronic atrial fibrillation: Secondary | ICD-10-CM | POA: Diagnosis present

## 2017-04-13 DIAGNOSIS — Z955 Presence of coronary angioplasty implant and graft: Secondary | ICD-10-CM

## 2017-04-13 MED ORDER — VANCOMYCIN HCL IN DEXTROSE 1-5 GM/200ML-% IV SOLN
1000.0000 mg | Freq: Once | INTRAVENOUS | Status: AC
  Administered 2017-04-14: 1000 mg via INTRAVENOUS
  Filled 2017-04-13: qty 200

## 2017-04-13 MED ORDER — LEVOFLOXACIN IN D5W 500 MG/100ML IV SOLN
500.0000 mg | Freq: Once | INTRAVENOUS | Status: AC
  Administered 2017-04-14: 500 mg via INTRAVENOUS
  Filled 2017-04-13: qty 100

## 2017-04-13 NOTE — ED Provider Notes (Signed)
[facility] Emergency Department Provider Note    First MD Initiated Contact with Patient 04/13/17 2318     (approximate)  I have reviewed the triage vital signs and the nursing notes.   HISTORY  Chief Complaint Code Sepsis    HPI [name] is a 83 y.o. female with below list of chronic medical conditions presents to the emergency department via EMS with dyspnea oxygen saturation 92% on arrival, vomiting, abdominal discomfort, fever and coughWith onset today.   Past Medical History:  Diagnosis Date  . Cancer ([place])    skin ca  . CHF (congestive heart failure) ([place])   . Chronic back pain   . COPD (chronic obstructive pulmonary disease) ([place])   . Diabetes mellitus without complication ([place])   . Hypertension   . Poor perfusion of leg     Patient Active Problem List   Diagnosis Date Noted  . Sepsis ([place]) 04/14/2017  . CAP (community acquired pneumonia) 04/14/2017  . COPD (chronic obstructive pulmonary disease) ([place]) 12/07/2016  . UTI (urinary tract infection) 12/07/2016  . Ventral hernia without obstruction or gangrene   . Pneumonia 09/07/2015  . Hyponatremia 09/07/2015  . Acute respiratory failure ([place]) 09/01/2015  . Atrial fibrillation ([place]) 04/10/2015  . Essential hypertension 04/10/2015  . Diabetes ([place]) 04/10/2015  . Back pain 04/10/2015  . Acute on chronic combined systolic and diastolic CHF (congestive heart failure) ([place]) 03/21/2015  . Pulmonary nodule 11/10/2014    Past Surgical History:  Procedure Laterality Date  . ABDOMINAL HYSTERECTOMY    . BACK SURGERY     x3  . CORONARY STENT PLACEMENT     unknown location per pt  . FOOT SURGERY    . LEG SURGERY    . PACEMAKER PLACEMENT    . TONSILLECTOMY      Prior to Admission medications   Medication Sig Start Date End Date Taking? Authorizing Provider  albuterol (PROVENTIL HFA;VENTOLIN HFA) 108 (90 Base) MCG/ACT inhaler Inhale 1-2 puffs into the lungs every 6 (six) hours  as needed for wheezing or shortness of breath. 09/22/16   Harrie Foreman, MD  apixaban (ELIQUIS) 5 MG TABS tablet Take 5 mg by mouth 2 (two) times daily.    [provider]  baclofen (LIORESAL) 10 MG tablet Take 10 mg by mouth 3 (three) times daily.    [provider]  budesonide-formoterol (SYMBICORT) 160-4.5 MCG/ACT inhaler Inhale 2 puffs into the lungs 2 (two) times daily. 09/03/15   Gladstone Lighter, MD  carvedilol (COREG) 6.25 MG tablet Take 6.25 mg by mouth 2 (two) times daily with a meal.     [provider]  cholecalciferol (VITAMIN D) 1000 units tablet Take 1,000 Units by mouth daily.    [provider]  docusate sodium (COLACE) 100 MG capsule Take 100 mg by mouth 2 (two) times daily.     [provider]  escitalopram (LEXAPRO) 10 MG tablet Take 10 mg by mouth daily.    [provider]  fentaNYL (DURAGESIC - DOSED MCG/HR) 50 MCG/HR Place 50 mcg onto the skin every other day.     [provider]  ferrous sulfate 325 (65 FE) MG tablet Take 325 mg by mouth daily with breakfast.    [provider]  furosemide (LASIX) 40 MG tablet Take 1 tablet (40 mg total) by mouth daily. Pt alternates with the 80mg  tablet. 12/09/16   Bettey Costa, MD  glimepiride (AMARYL) 4 MG tablet Take 4 mg by mouth 2 (  two) times daily after a meal.    [provider]  insulin aspart (NOVOLOG FLEXPEN) 100 UNIT/ML FlexPen Inject 5 Units into the skin daily before lunch. IF blood sugar is greater than 200 09/22/16   Harrie Foreman, MD  losartan (COZAAR) 25 MG tablet Take 25 mg by mouth daily.    [provider]  magnesium oxide (MAG-OX) 400 MG tablet Take 400 mg by mouth daily.    [provider]  Melatonin 1 MG TABS Take 1 mg by mouth at bedtime.    [provider]  metolazone (ZAROXOLYN) 2.5 MG tablet Take 2.5 mg by mouth 2 (two) times a week. Pt takes on Monday and Friday.    [provider]  oxyCODONE  (OXY IR/ROXICODONE) 5 MG immediate release tablet Take 5-10 mg by mouth every 4 (four) hours as needed for severe pain.     [provider]  pantoprazole (PROTONIX) 40 MG tablet Take 40 mg by mouth daily.    [provider]  potassium chloride (KLOR-CON) 20 MEQ packet Take by mouth 2 (two) times daily.    [provider]  rOPINIRole (REQUIP) 0.5 MG tablet Take 0.5 mg by mouth 2 (two) times daily.    [provider]  sitaGLIPtin (JANUVIA) 100 MG tablet Take 100 mg by mouth daily.    [provider]  spironolactone (ALDACTONE) 25 MG tablet Take 25 mg by mouth daily.     [provider]  vitamin B-12 (CYANOCOBALAMIN) 1000 MCG tablet Take 1,000 mcg by mouth daily.    [provider]  vitamin C (ASCORBIC ACID) 500 MG tablet Take 500 mg by mouth daily.    [provider]  Vitamin D, Ergocalciferol, (DRISDOL) 50000 UNITS CAPS capsule Take 50,000 Units by mouth every 7 (seven) days. Pt takes on Thursday.    [provider]    Allergies Ceftriaxone; Cefuroxime; Cephalosporins; Codeine; Epinephrine; Morphine and related; Buprenorphine hcl; Ciprofloxacin; Latex; Lidocaine; and Procaine  Family History  Problem Relation Age of Onset  . CVA Mother        Congestive Heart Failure, heart attack, hypertension, stroke  . Diabetes Mellitus II Sister   . Heart disease Father        Congestive Heart Failure, heart attack, hypertension    Social History Social History  Substance Use Topics  . Smoking status: Never Smoker  . Smokeless tobacco: Never Used  . Alcohol use No    Review of Systems Constitutional: positive for fever/chills Eyes: No visual changes. ENT: No sore throat. Cardiovascular: Denies chest pain. Respiratory: positive for cough and dyspnea Gastrointestinal: No abdominal pain.  No nausea, no vomiting.  No diarrhea.  No constipation. Genitourinary: Negative for dysuria. Musculoskeletal: Negative for neck  pain.  Negative for back pain.positive for left leg pain and redness Integumentary: Negative for rash. Neurological: Negative for headaches, focal weakness or numbness.   ____________________________________________   PHYSICAL EXAM:  VITAL SIGNS: ED Triage Vitals [04/13/17 2319]  Enc Vitals Group     BP      Pulse      Resp      Temp      Temp src      SpO2 92 %     Weight      Height      Head Circumference      Peak Flow      Pain Score      Pain Loc      Pain Edu?  Excl. in [place]?     Constitutional: Alert and oriented. Ill Appearing Eyes: Conjunctivae are normal.  Head: Atraumatic. Mouth/Throat: Mucous membranes are moist.  Oropharynx non-erythematous. Neck: No stridor.   Cardiovascular: Normal rate, regular rhythm. Good peripheral circulation. Grossly normal heart sounds. Respiratory: Normal respiratory effort.  No retractions. bibasilar rhonchi Gastrointestinal: Soft and nontender. No distention.  Musculoskeletal: wound noted on the left foot with blanching erythema extending to the proximal calf. Bilateral lower extremity pitting edema approximately 1+ Neurologic:  Normal speech and language. No gross focal neurologic deficits are appreciated.  Skin:  Skin is warm, dry and intact. No rash noted. Psychiatric: Mood and affect are normal. Speech and behavior are normal.  ____________________________________________   LABS (all labs ordered are listed, but only abnormal results are displayed)  Labs Reviewed  COMPREHENSIVE METABOLIC PANEL - Abnormal; Notable for the following:       Result Value   Glucose, Bld 360 (*)    BUN 47 (*)    Creatinine, Ser 1.15 (*)    Calcium 8.8 (*)    GFR calc non Af Amer 41 (*)    GFR calc Af Amer 47 (*)    All other components within normal limits  CBC WITH DIFFERENTIAL/PLATELET - Abnormal; Notable for the following:    WBC 13.8 (*)    MCHC 31.6 (*)    RDW 15.8 (*)    Platelets 137 (*)    Neutro Abs 12.5 (*)    Lymphs  Abs 0.5 (*)    All other components within normal limits  CULTURE, BLOOD (ROUTINE X 2)  CULTURE, BLOOD (ROUTINE X 2)  LACTIC ACID, PLASMA  LIPASE, BLOOD  LACTIC ACID, PLASMA  URINALYSIS, COMPLETE (UACMP) WITH MICROSCOPIC   ____________________________________________  EKG  ED ECG REPORT I, Hercules N Myliyah Rebuck, the attending physician, personally viewed and interpreted this ECG.   Date: 04/14/2017  EKG Time: 11:27 PM  Rate: 71  Rhythm: ventricular paced rhythm  Axis:Normal  Intervals:normal  ST&T Change: none  ____________________________________________  RADIOLOGY I, [place] Ernst Bowler, personally viewed and evaluated these images (plain radiographs) as part of my medical decision making, as well as reviewing the written report by the radiologist.  Dg Chest Port 1 View  Result Date: 04/14/2017 CLINICAL DATA:  Acute onset of cough and shortness of breath. Initial encounter. EXAM: PORTABLE CHEST 1 VIEW COMPARISON:  Chest radiograph performed 12/07/2016 FINDINGS: The lungs are well-aerated. Mild right basilar airspace opacity raises concern for pneumonia. There is no evidence of pleural effusion or pneumothorax. The cardiomediastinal silhouette is mildly enlarged. A pacemaker is noted overlying the left chest wall, with leads ending overlying the right atrium and right ventricle. No acute osseous abnormalities are seen. IMPRESSION: Mild right basilar airspace opacity raises concern for pneumonia. Mild cardiomegaly. Electronically Signed   By: Garald Balding M.D.   On: 04/14/2017 00:05    ____________________________________________   PROCEDURES  Critical Care performed:CRITICAL CARE Performed by: Gregor Hams   Total critical care time: 40 minutes  Critical care time was exclusive of separately billable procedures and treating other patients.  Critical care was necessary to treat or prevent imminent or life-threatening deterioration.  Critical care was time spent  personally by me on the following activities: development of treatment plan with patient and/or surrogate as well as nursing, discussions with consultants, evaluation of patient's response to treatment, examination of patient, obtaining history from patient or surrogate, ordering and performing treatments and interventions, ordering and review of laboratory studies, ordering and  review of radiographic studies, pulse oximetry and re-evaluation of patient's condition.  Procedures   ____________________________________________   INITIAL IMPRESSION / ASSESSMENT AND PLAN / ED COURSE  As part of my medical decision making, I reviewed the following data within the electronic MEDICAL RECORD NUMBER 83 year old female presenting with above stated history of physical exam consistent with sepsis. Patient wanted touch on arrival temperature 102.4 Hypoxic. Physical exam concerning for pneumonia and left lower extremity cellulitis.Sepsis protocol initiated. Patient given IV vancomycin and Levaquin.Patient discussed with Dr Jannifer Franklin for hospital admission for further evaluation and management.    ____________________________________________  FINAL CLINICAL IMPRESSION(S) / ED DIAGNOSES  Final diagnoses:  Community acquired pneumonia, unspecified laterality  Left leg cellulitis     MEDICATIONS GIVEN DURING THIS VISIT:  Medications  vancomycin (VANCOCIN) IVPB 1000 mg/200 mL premix (1,000 mg Intravenous New Bag/Given 04/14/17 0036)  levofloxacin (LEVAQUIN) IVPB 500 mg (500 mg Intravenous New Bag/Given 04/14/17 0014)     NEW OUTPATIENT MEDICATIONS STARTED DURING THIS VISIT:  New Prescriptions   No medications on file    Modified Medications   No medications on file    Discontinued Medications   No medications on file     Note:  This document was prepared using Dragon voice recognition software and may include unintentional dictation errors.    Gregor Hams, MD 04/14/17 [phone]

## 2017-04-13 NOTE — ED Notes (Signed)
Pt placed on NRB; sats 100%

## 2017-04-14 ENCOUNTER — Inpatient Hospital Stay: Payer: Medicare Other

## 2017-04-14 ENCOUNTER — Encounter: Payer: Self-pay | Admitting: Emergency Medicine

## 2017-04-14 DIAGNOSIS — A4151 Sepsis due to Escherichia coli [E. coli]: Secondary | ICD-10-CM | POA: Diagnosis present

## 2017-04-14 DIAGNOSIS — Z888 Allergy status to other drugs, medicaments and biological substances status: Secondary | ICD-10-CM | POA: Diagnosis not present

## 2017-04-14 DIAGNOSIS — Z794 Long term (current) use of insulin: Secondary | ICD-10-CM | POA: Diagnosis not present

## 2017-04-14 DIAGNOSIS — A419 Sepsis, unspecified organism: Secondary | ICD-10-CM | POA: Diagnosis present

## 2017-04-14 DIAGNOSIS — Z85828 Personal history of other malignant neoplasm of skin: Secondary | ICD-10-CM | POA: Diagnosis not present

## 2017-04-14 DIAGNOSIS — J440 Chronic obstructive pulmonary disease with acute lower respiratory infection: Secondary | ICD-10-CM | POA: Diagnosis present

## 2017-04-14 DIAGNOSIS — M549 Dorsalgia, unspecified: Secondary | ICD-10-CM | POA: Diagnosis present

## 2017-04-14 DIAGNOSIS — I482 Chronic atrial fibrillation: Secondary | ICD-10-CM | POA: Diagnosis present

## 2017-04-14 DIAGNOSIS — Z881 Allergy status to other antibiotic agents status: Secondary | ICD-10-CM | POA: Diagnosis not present

## 2017-04-14 DIAGNOSIS — J189 Pneumonia, unspecified organism: Secondary | ICD-10-CM | POA: Diagnosis present

## 2017-04-14 DIAGNOSIS — E119 Type 2 diabetes mellitus without complications: Secondary | ICD-10-CM | POA: Diagnosis present

## 2017-04-14 DIAGNOSIS — L03116 Cellulitis of left lower limb: Secondary | ICD-10-CM | POA: Diagnosis present

## 2017-04-14 DIAGNOSIS — Z7901 Long term (current) use of anticoagulants: Secondary | ICD-10-CM | POA: Diagnosis not present

## 2017-04-14 DIAGNOSIS — Z7951 Long term (current) use of inhaled steroids: Secondary | ICD-10-CM | POA: Diagnosis not present

## 2017-04-14 DIAGNOSIS — Z955 Presence of coronary angioplasty implant and graft: Secondary | ICD-10-CM | POA: Diagnosis not present

## 2017-04-14 DIAGNOSIS — F0390 Unspecified dementia without behavioral disturbance: Secondary | ICD-10-CM | POA: Diagnosis present

## 2017-04-14 DIAGNOSIS — I509 Heart failure, unspecified: Secondary | ICD-10-CM | POA: Diagnosis present

## 2017-04-14 DIAGNOSIS — G8929 Other chronic pain: Secondary | ICD-10-CM | POA: Diagnosis present

## 2017-04-14 DIAGNOSIS — Z79899 Other long term (current) drug therapy: Secondary | ICD-10-CM | POA: Diagnosis not present

## 2017-04-14 DIAGNOSIS — Z7984 Long term (current) use of oral hypoglycemic drugs: Secondary | ICD-10-CM | POA: Diagnosis not present

## 2017-04-14 DIAGNOSIS — Z950 Presence of cardiac pacemaker: Secondary | ICD-10-CM | POA: Diagnosis not present

## 2017-04-14 DIAGNOSIS — Z885 Allergy status to narcotic agent status: Secondary | ICD-10-CM | POA: Diagnosis not present

## 2017-04-14 DIAGNOSIS — Z91040 Latex allergy status: Secondary | ICD-10-CM | POA: Diagnosis not present

## 2017-04-14 DIAGNOSIS — I110 Hypertensive heart disease with heart failure: Secondary | ICD-10-CM | POA: Diagnosis present

## 2017-04-14 LAB — BASIC METABOLIC PANEL
ANION GAP: 5 (ref 5–15)
BUN: 47 mg/dL — ABNORMAL HIGH (ref 6–20)
CHLORIDE: 105 mmol/L (ref 101–111)
CO2: 25 mmol/L (ref 22–32)
Calcium: 8.3 mg/dL — ABNORMAL LOW (ref 8.9–10.3)
Creatinine, Ser: 1.31 mg/dL — ABNORMAL HIGH (ref 0.44–1.00)
GFR calc Af Amer: 41 mL/min — ABNORMAL LOW (ref >60)
GFR, EST NON AFRICAN AMERICAN: 35 mL/min — AB (ref >60)
GLUCOSE: 335 mg/dL — AB (ref 65–99)
POTASSIUM: 4.9 mmol/L (ref 3.5–5.1)
Sodium: 135 mmol/L (ref 135–145)

## 2017-04-14 LAB — BLOOD CULTURE ID PANEL (REFLEXED)
Acinetobacter baumannii: NOT DETECTED
CANDIDA KRUSEI: NOT DETECTED
CANDIDA PARAPSILOSIS: NOT DETECTED
CARBAPENEM RESISTANCE: NOT DETECTED
Candida albicans: NOT DETECTED
Candida glabrata: NOT DETECTED
Candida tropicalis: NOT DETECTED
ENTEROBACTERIACEAE SPECIES: DETECTED — AB
ENTEROCOCCUS SPECIES: NOT DETECTED
Enterobacter cloacae complex: NOT DETECTED
Escherichia coli: DETECTED — AB
HAEMOPHILUS INFLUENZAE: NOT DETECTED
KLEBSIELLA OXYTOCA: NOT DETECTED
Klebsiella pneumoniae: NOT DETECTED
LISTERIA MONOCYTOGENES: NOT DETECTED
Neisseria meningitidis: NOT DETECTED
PSEUDOMONAS AERUGINOSA: NOT DETECTED
Proteus species: NOT DETECTED
SERRATIA MARCESCENS: NOT DETECTED
STAPHYLOCOCCUS AUREUS BCID: NOT DETECTED
STAPHYLOCOCCUS SPECIES: NOT DETECTED
STREPTOCOCCUS PNEUMONIAE: NOT DETECTED
STREPTOCOCCUS PYOGENES: NOT DETECTED
STREPTOCOCCUS SPECIES: NOT DETECTED
Streptococcus agalactiae: NOT DETECTED

## 2017-04-14 LAB — URINALYSIS, COMPLETE (UACMP) WITH MICROSCOPIC
Bacteria, UA: NONE SEEN
Bilirubin Urine: NEGATIVE
Hgb urine dipstick: NEGATIVE
Ketones, ur: NEGATIVE mg/dL
Leukocytes, UA: NEGATIVE
Nitrite: NEGATIVE
PH: 5 (ref 5.0–8.0)
Protein, ur: NEGATIVE mg/dL
SPECIFIC GRAVITY, URINE: 1.008 (ref 1.005–1.030)
Squamous Epithelial / LPF: NONE SEEN
WBC, UA: NONE SEEN WBC/hpf (ref 0–5)

## 2017-04-14 LAB — COMPREHENSIVE METABOLIC PANEL
ALBUMIN: 4 g/dL (ref 3.5–5.0)
ALK PHOS: 55 U/L (ref 38–126)
ALT: 23 U/L (ref 14–54)
ANION GAP: 8 (ref 5–15)
AST: 29 U/L (ref 15–41)
BILIRUBIN TOTAL: 1.1 mg/dL (ref 0.3–1.2)
BUN: 47 mg/dL — ABNORMAL HIGH (ref 6–20)
CALCIUM: 8.8 mg/dL — AB (ref 8.9–10.3)
CO2: 26 mmol/L (ref 22–32)
Chloride: 102 mmol/L (ref 101–111)
Creatinine, Ser: 1.15 mg/dL — ABNORMAL HIGH (ref 0.44–1.00)
GFR calc Af Amer: 47 mL/min — ABNORMAL LOW (ref >60)
GFR, EST NON AFRICAN AMERICAN: 41 mL/min — AB (ref >60)
GLUCOSE: 360 mg/dL — AB (ref 65–99)
Potassium: 5 mmol/L (ref 3.5–5.1)
Sodium: 136 mmol/L (ref 135–145)
TOTAL PROTEIN: 6.8 g/dL (ref 6.5–8.1)

## 2017-04-14 LAB — GLUCOSE, CAPILLARY
GLUCOSE-CAPILLARY: 312 mg/dL — AB (ref 65–99)
GLUCOSE-CAPILLARY: 171 mg/dL — AB (ref 65–99)
Glucose-Capillary: 324 mg/dL — ABNORMAL HIGH (ref 65–99)
Glucose-Capillary: 195 mg/dL — ABNORMAL HIGH (ref 65–99)
Glucose-Capillary: 161 mg/dL — ABNORMAL HIGH (ref 65–99)

## 2017-04-14 LAB — CBC
HEMATOCRIT: 33 % — AB (ref 35.0–47.0)
HEMOGLOBIN: 10.6 g/dL — AB (ref 12.0–16.0)
MCH: 29.9 pg (ref 26.0–34.0)
MCHC: 32.2 g/dL (ref 32.0–36.0)
MCV: 93.1 fL (ref 80.0–100.0)
Platelets: 103 10*3/uL — ABNORMAL LOW (ref 150–440)
RBC: 3.54 MIL/uL — ABNORMAL LOW (ref 3.80–5.20)
RDW: 15.9 % — ABNORMAL HIGH (ref 11.5–14.5)
WBC: 12.6 10*3/uL — ABNORMAL HIGH (ref 3.6–11.0)

## 2017-04-14 LAB — CBC WITH DIFFERENTIAL/PLATELET
BASOS ABS: 0.1 10*3/uL (ref 0–0.1)
BASOS PCT: 1 %
EOS PCT: 0 %
Eosinophils Absolute: 0 10*3/uL (ref 0–0.7)
HEMATOCRIT: 38 % (ref 35.0–47.0)
Hemoglobin: 12 g/dL (ref 12.0–16.0)
Lymphocytes Relative: 4 %
Lymphs Abs: 0.5 10*3/uL — ABNORMAL LOW (ref 1.0–3.6)
MCH: 29.5 pg (ref 26.0–34.0)
MCHC: 31.6 g/dL — ABNORMAL LOW (ref 32.0–36.0)
MCV: 93.3 fL (ref 80.0–100.0)
MONO ABS: 0.7 10*3/uL (ref 0.2–0.9)
Monocytes Relative: 5 %
NEUTROS ABS: 12.5 10*3/uL — AB (ref 1.4–6.5)
Neutrophils Relative %: 90 %
PLATELETS: 137 10*3/uL — AB (ref 150–440)
RBC: 4.07 MIL/uL (ref 3.80–5.20)
RDW: 15.8 % — AB (ref 11.5–14.5)
WBC: 13.8 10*3/uL — ABNORMAL HIGH (ref 3.6–11.0)

## 2017-04-14 LAB — LIPASE, BLOOD: LIPASE: 28 U/L (ref 11–51)

## 2017-04-14 LAB — LACTIC ACID, PLASMA
LACTIC ACID, VENOUS: 1.9 mmol/L (ref 0.5–1.9)
Lactic Acid, Venous: 1.9 mmol/L (ref 0.5–1.9)

## 2017-04-14 MED ORDER — ESCITALOPRAM OXALATE 10 MG PO TABS
10.0000 mg | ORAL_TABLET | Freq: Every day | ORAL | Status: DC
  Administered 2017-04-14 – 2017-04-18 (×5): 10 mg via ORAL
  Filled 2017-04-14 (×5): qty 1

## 2017-04-14 MED ORDER — ONDANSETRON HCL 4 MG/2ML IJ SOLN
4.0000 mg | Freq: Four times a day (QID) | INTRAMUSCULAR | Status: DC | PRN
  Administered 2017-04-17: 4 mg via INTRAVENOUS
  Filled 2017-04-14: qty 2

## 2017-04-14 MED ORDER — CARVEDILOL 3.125 MG PO TABS
6.2500 mg | ORAL_TABLET | Freq: Two times a day (BID) | ORAL | Status: DC
  Administered 2017-04-14 – 2017-04-18 (×9): 6.25 mg via ORAL
  Filled 2017-04-14 (×9): qty 2

## 2017-04-14 MED ORDER — OXYCODONE HCL 5 MG PO TABS
5.0000 mg | ORAL_TABLET | Freq: Four times a day (QID) | ORAL | Status: DC | PRN
  Administered 2017-04-14 – 2017-04-17 (×4): 5 mg via ORAL
  Administered 2017-04-18: 10 mg via ORAL
  Filled 2017-04-14: qty 1
  Filled 2017-04-14: qty 2
  Filled 2017-04-14 (×3): qty 1

## 2017-04-14 MED ORDER — APIXABAN 5 MG PO TABS: 5.0000 mg | ORAL_TABLET | Freq: Two times a day (BID) | ORAL | Status: DC

## 2017-04-14 MED ORDER — ACETAMINOPHEN 325 MG PO TABS
650.0000 mg | ORAL_TABLET | Freq: Four times a day (QID) | ORAL | Status: DC | PRN
  Administered 2017-04-14: 08:00:00 650 mg via ORAL
  Filled 2017-04-14: qty 2

## 2017-04-14 MED ORDER — ONDANSETRON HCL 4 MG PO TABS: 4.0000 mg | ORAL_TABLET | Freq: Four times a day (QID) | ORAL | Status: DC | PRN

## 2017-04-14 MED ORDER — SODIUM CHLORIDE 0.9 % IV SOLN
INTRAVENOUS | Status: AC
  Administered 2017-04-14: 05:00:00 via INTRAVENOUS

## 2017-04-14 MED ORDER — RIVAROXABAN 15 MG PO TABS
15.0000 mg | ORAL_TABLET | Freq: Every day | ORAL | Status: DC
  Administered 2017-04-15 – 2017-04-18 (×4): 15 mg via ORAL
  Filled 2017-04-14 (×4): qty 1

## 2017-04-14 MED ORDER — SPIRONOLACTONE 25 MG PO TABS
25.0000 mg | ORAL_TABLET | Freq: Every day | ORAL | Status: DC
  Administered 2017-04-15 – 2017-04-18 (×3): 25 mg via ORAL
  Filled 2017-04-14 (×4): qty 1

## 2017-04-14 MED ORDER — LEVOFLOXACIN IN D5W 500 MG/100ML IV SOLN
500.0000 mg | INTRAVENOUS | Status: DC
  Filled 2017-04-14: qty 100

## 2017-04-14 MED ORDER — MOMETASONE FURO-FORMOTEROL FUM 200-5 MCG/ACT IN AERO
2.0000 | INHALATION_SPRAY | Freq: Two times a day (BID) | RESPIRATORY_TRACT | Status: DC
  Administered 2017-04-14 – 2017-04-18 (×9): 2 via RESPIRATORY_TRACT
  Filled 2017-04-14: qty 8.8

## 2017-04-14 MED ORDER — INSULIN GLARGINE 100 UNIT/ML ~~LOC~~ SOLN
8.0000 [IU] | Freq: Every day | SUBCUTANEOUS | Status: DC
  Administered 2017-04-14 – 2017-04-18 (×5): 8 [IU] via SUBCUTANEOUS
  Filled 2017-04-14 (×6): qty 0.08

## 2017-04-14 MED ORDER — ACETAMINOPHEN 650 MG RE SUPP: 650.0000 mg | Freq: Four times a day (QID) | RECTAL | Status: DC | PRN

## 2017-04-14 MED ORDER — APIXABAN 5 MG PO TABS
5.0000 mg | ORAL_TABLET | Freq: Two times a day (BID) | ORAL | Status: DC
  Administered 2017-04-14: 08:00:00 5 mg via ORAL
  Filled 2017-04-14: qty 1

## 2017-04-14 MED ORDER — LEVOFLOXACIN IN D5W 750 MG/150ML IV SOLN: 750.0000 mg | INTRAVENOUS | Status: DC

## 2017-04-14 MED ORDER — INSULIN ASPART 100 UNIT/ML ~~LOC~~ SOLN
0.0000 [IU] | Freq: Three times a day (TID) | SUBCUTANEOUS | Status: DC
  Administered 2017-04-14 (×2): 2 [IU] via SUBCUTANEOUS
  Administered 2017-04-14: 7 [IU] via SUBCUTANEOUS
  Administered 2017-04-15: 18:00:00 3 [IU] via SUBCUTANEOUS
  Administered 2017-04-15: 13:00:00 2 [IU] via SUBCUTANEOUS
  Administered 2017-04-16: 3 [IU] via SUBCUTANEOUS
  Administered 2017-04-16 (×2): 2 [IU] via SUBCUTANEOUS
  Administered 2017-04-17: 3 [IU] via SUBCUTANEOUS
  Administered 2017-04-17 – 2017-04-18 (×3): 2 [IU] via SUBCUTANEOUS
  Administered 2017-04-18: 1 [IU] via SUBCUTANEOUS
  Filled 2017-04-14 (×13): qty 1

## 2017-04-14 MED ORDER — SODIUM CHLORIDE 0.9 % IV SOLN
1.0000 g | Freq: Two times a day (BID) | INTRAVENOUS | Status: DC
  Administered 2017-04-14 – 2017-04-17 (×6): 1 g via INTRAVENOUS
  Filled 2017-04-14 (×7): qty 1

## 2017-04-14 MED ORDER — PANTOPRAZOLE SODIUM 40 MG PO TBEC
40.0000 mg | DELAYED_RELEASE_TABLET | Freq: Every day | ORAL | Status: DC
  Administered 2017-04-14 – 2017-04-18 (×5): 40 mg via ORAL
  Filled 2017-04-14 (×5): qty 1

## 2017-04-14 MED ORDER — VANCOMYCIN HCL IN DEXTROSE 1-5 GM/200ML-% IV SOLN
1000.0000 mg | INTRAVENOUS | Status: DC
  Administered 2017-04-14: 1000 mg via INTRAVENOUS
  Filled 2017-04-14: qty 200

## 2017-04-14 MED ORDER — INSULIN ASPART 100 UNIT/ML ~~LOC~~ SOLN
0.0000 [IU] | Freq: Every day | SUBCUTANEOUS | Status: DC
  Administered 2017-04-16: 2 [IU] via SUBCUTANEOUS
  Filled 2017-04-14: qty 1

## 2017-04-14 MED ORDER — ACETAMINOPHEN 325 MG PO TABS
650.0000 mg | ORAL_TABLET | Freq: Once | ORAL | Status: AC
  Administered 2017-04-14: 650 mg via ORAL
  Filled 2017-04-14: qty 2

## 2017-04-14 NOTE — ED Notes (Signed)
Dr Owens Shark back at bedside to speak with pt; oxygen changed to 4L via [place] by MD;

## 2017-04-14 NOTE — Consult Note (Addendum)
[facility] Nurse wound consult note Reason for Consult:Patient  has hyperkeratotic lesions present at plantar surface of  first met cuneiform joint of the left foot and also at the distal tip of the right hallux. These areas that have been ulcer in the past but they have healed over now they are hyperkeratotic/calloused lesions. Last saw Dr Troxler/podiatry 03/25/17 Wound type:neuropathic ulcer Pressure Injury POA: Yes Measurement: 2 cm x 3 cm intact callous. Dressing in place and seemed to macerate this area.  Recommend leaving open to air and offloading pressure.  Wound ALP:[id number] Drainage (amount, consistency, odor) none Periwound: slight maceration noted when dressing removed.  Dressing procedure/placement/frequency:Cleanse wound to left foot with NS and leave open to air.  No dressing as this macerates the calloused area.  Will not follow at this time.  Please re-consult if needed.  Domenic Moras RN BSN [place] Pager [phone]

## 2017-04-14 NOTE — Progress Notes (Signed)
Inpatient Diabetes Program Recommendations  AACE/ADA: New Consensus Statement on Inpatient Glycemic Control (2015)  Target Ranges:  Prepandial:   less than 140 mg/dL      Peak postprandial:   less than 180 mg/dL (1-2 hours)      Critically ill patients:  140 - 180 mg/dL   Results for [name], [name] (MRN [id number]) as of 04/14/2017 09:43  Ref. Range 04/14/2017 04:36 04/14/2017 07:35  Glucose-Capillary Latest Ref Range: 65 - 99 mg/dL 324 (H) 312 (H)  Results for RUSSIA, SCHEIDERER (MRN [id number]) as of 04/14/2017 09:43  Ref. Range 04/13/2017 23:56 04/14/2017 05:20  Glucose Latest Ref Range: 65 - 99 mg/dL 360 (H) 335 (H)   Review of Glycemic Control  Diabetes history: DM2 Outpatient Diabetes medications: Amaryl 4 mg BID, Januvia 100 mg daily, Novolog 5 units with lunch if CBG over 200 mg/dl Current orders for Inpatient glycemic control: Novolog 0-9 units TID with meals, Novolog 0-5 units QHS  Inpatient Diabetes Program Recommendations: Insulin - Basal: Please consider ordering Lantus 8 units Q24H (based on 78.9 kg x 0.1 units). HgbA1C: Please consider ordering an A1C to evaluate glycemic control over the past 2-3 months.   NOTE:  Initial glucose 360 mg/dl on 04/13/17 at 23:56 and last A1C was 7.0% on 03/20/2015.   Thanks, Barnie Alderman, RN, MSN, CDE Diabetes Coordinator Inpatient Diabetes Program [phone] (Team Pager from 8am to 5pm)

## 2017-04-14 NOTE — H&P (Signed)
[facility] [name]    MR#:  [id number]  DATE OF BIRTH:  [birth year]  DATE OF ADMISSION:  04/13/2017  PRIMARY CARE PHYSICIAN: Tracie Harrier, MD   REQUESTING/REFERRING PHYSICIAN: Owens Shark, MD  CHIEF COMPLAINT:   Chief Complaint  Patient presents with  . Code Sepsis    HISTORY OF PRESENT ILLNESS:  [name]  is a 83 y.o. female who presents with shortness of breath. Patient is unable to relate information for her history that she has baseline dementia and currently has significant confusion. Husband was present here with her earlier and states that she has been getting progressively short of breath over a couple of days, and had a fever. Here she is still febrile, and workup shows sepsis due to pneumonia. Hospitalists were called for admission  PAST MEDICAL HISTORY:   Past Medical History:  Diagnosis Date  . Cancer ([place])    skin ca  . CHF (congestive heart failure) ([place])   . Chronic back pain   . COPD (chronic obstructive pulmonary disease) ([place])   . Diabetes mellitus without complication ([place])   . Hypertension   . Poor perfusion of leg     PAST SURGICAL HISTORY:   Past Surgical History:  Procedure Laterality Date  . ABDOMINAL HYSTERECTOMY    . BACK SURGERY     x3  . CORONARY STENT PLACEMENT     unknown location per pt  . FOOT SURGERY    . LEG SURGERY    . PACEMAKER PLACEMENT    . TONSILLECTOMY      SOCIAL HISTORY:   Social History  Substance Use Topics  . Smoking status: Never Smoker  . Smokeless tobacco: Never Used  . Alcohol use No    FAMILY HISTORY:   Family History  Problem Relation Age of Onset  . CVA Mother        Congestive Heart Failure, heart attack, hypertension, stroke  . Diabetes Mellitus II Sister   . Heart disease Father        Congestive Heart Failure, heart attack, hypertension    DRUG ALLERGIES:   Allergies  Allergen Reactions  . Ceftriaxone Swelling  .  Cefuroxime Hives  . Cephalosporins Swelling and Other (See Comments)    Reaction:  Fainting/dry mouth   . Codeine Nausea And Vomiting  . Epinephrine Other (See Comments)    Reaction:  Fainting   . Morphine And Related Other (See Comments)    Reaction:  GI upset   . Buprenorphine Hcl Nausea And Vomiting  . Ciprofloxacin Itching, Swelling and Rash  . Latex Rash  . Lidocaine Rash  . Procaine Rash    MEDICATIONS AT HOME:   Prior to Admission medications   Medication Sig Start Date End Date Taking? Authorizing Provider  albuterol (PROVENTIL HFA;VENTOLIN HFA) 108 (90 Base) MCG/ACT inhaler Inhale 1-2 puffs into the lungs every 6 (six) hours as needed for wheezing or shortness of breath. 09/22/16   Harrie Foreman, MD  apixaban (ELIQUIS) 5 MG TABS tablet Take 5 mg by mouth 2 (two) times daily.    [provider]  baclofen (LIORESAL) 10 MG tablet Take 10 mg by mouth 3 (three) times daily.    [provider]  budesonide-formoterol (SYMBICORT) 160-4.5 MCG/ACT inhaler Inhale 2 puffs into the lungs 2 (two) times daily. 09/03/15   Gladstone Lighter, MD  carvedilol (COREG) 6.25 MG tablet Take 6.25 mg by mouth 2 (two) times daily with a  meal.     [provider]  cholecalciferol (VITAMIN D) 1000 units tablet Take 1,000 Units by mouth daily.    [provider]  docusate sodium (COLACE) 100 MG capsule Take 100 mg by mouth 2 (two) times daily.     [provider]  escitalopram (LEXAPRO) 10 MG tablet Take 10 mg by mouth daily.    [provider]  fentaNYL (DURAGESIC - DOSED MCG/HR) 50 MCG/HR Place 50 mcg onto the skin every other day.     [provider]  ferrous sulfate 325 (65 FE) MG tablet Take 325 mg by mouth daily with breakfast.    [provider]  furosemide (LASIX) 40 MG tablet Take 1 tablet (40 mg total) by mouth daily. Pt alternates with the 80mg  tablet. 12/09/16   Bettey Costa, MD  glimepiride (AMARYL) 4 MG tablet Take 4 mg  by mouth 2 (two) times daily after a meal.    [provider]  insulin aspart (NOVOLOG FLEXPEN) 100 UNIT/ML FlexPen Inject 5 Units into the skin daily before lunch. IF blood sugar is greater than 200 09/22/16   Harrie Foreman, MD  losartan (COZAAR) 25 MG tablet Take 25 mg by mouth daily.    [provider]  magnesium oxide (MAG-OX) 400 MG tablet Take 400 mg by mouth daily.    [provider]  Melatonin 1 MG TABS Take 1 mg by mouth at bedtime.    [provider]  metolazone (ZAROXOLYN) 2.5 MG tablet Take 2.5 mg by mouth 2 (two) times a week. Pt takes on Monday and Friday.    [provider]  oxyCODONE (OXY IR/ROXICODONE) 5 MG immediate release tablet Take 5-10 mg by mouth every 4 (four) hours as needed for severe pain.     [provider]  pantoprazole (PROTONIX) 40 MG tablet Take 40 mg by mouth daily.    [provider]  potassium chloride (KLOR-CON) 20 MEQ packet Take by mouth 2 (two) times daily.    [provider]  rOPINIRole (REQUIP) 0.5 MG tablet Take 0.5 mg by mouth 2 (two) times daily.    [provider]  sitaGLIPtin (JANUVIA) 100 MG tablet Take 100 mg by mouth daily.    [provider]  spironolactone (ALDACTONE) 25 MG tablet Take 25 mg by mouth daily.     [provider]  vitamin B-12 (CYANOCOBALAMIN) 1000 MCG tablet Take 1,000 mcg by mouth daily.    [provider]  vitamin C (ASCORBIC ACID) 500 MG tablet Take 500 mg by mouth daily.    [provider]  Vitamin D, Ergocalciferol, (DRISDOL) 50000 UNITS CAPS capsule Take 50,000 Units by mouth every 7 (seven) days. Pt takes on Thursday.    [provider]    REVIEW OF SYSTEMS:  Review of Systems  Unable to perform ROS: Acuity of condition     VITAL SIGNS:   Vitals:   04/14/17 0025 04/14/17 0030 04/14/17 0100 04/14/17 0130  BP:  (!) 119/41 (!) 97/42 (!) 113/52  Pulse:  73 77 73  Resp:  (!) 24 (!) 22 (!) 22   Temp:   (!) 101.5 F (38.6 C) (!) 101.9 F (38.8 C)  TempSrc:   Other (Comment)   SpO2:  95% 95% 97%  Weight: 78.9 kg (174 lb)     Height: 5\' 7"  (1.702 m)      Wt Readings from Last 3 Encounters:  04/14/17 78.9 kg (174 lb)  12/09/16 81.5 kg (179 lb  9.6 oz)  12/06/16 79.4 kg (175 lb)    PHYSICAL EXAMINATION:  Physical Exam  Vitals reviewed. Constitutional: She appears well-developed and well-nourished. No distress.  HENT:  Head: Normocephalic and atraumatic.  Mouth/Throat: Oropharynx is clear and moist.  Eyes: Pupils are equal, round, and reactive to light. Conjunctivae and EOM are normal. No scleral icterus.  Neck: Normal range of motion. Neck supple. No JVD present. No thyromegaly present.  Cardiovascular: Normal rate, regular rhythm and intact distal pulses.  Exam reveals no gallop and no friction rub.   No murmur heard. Respiratory: Effort normal. No respiratory distress. She has no wheezes. She has no rales.  Rhonchi  GI: Soft. Bowel sounds are normal. She exhibits no distension. There is no tenderness.  Musculoskeletal: Normal range of motion. She exhibits no edema.  No arthritis, no gout  Lymphadenopathy:    She has no cervical adenopathy.  Neurological: She is alert. No cranial nerve deficit.  Unable to fully assess due to patient condition  Skin: Skin is warm and dry. No rash noted. No erythema.  Psychiatric:  Unable to fully assess due to patient condition    LABORATORY PANEL:   CBC  Recent Labs Lab 04/13/17 2356  WBC 13.8*  HGB 12.0  HCT 38.0  PLT 137*   ------------------------------------------------------------------------------------------------------------------  Chemistries   Recent Labs Lab 04/13/17 2356  NA 136  K 5.0  CL 102  CO2 26  GLUCOSE 360*  BUN 47*  CREATININE 1.15*  CALCIUM 8.8*  AST 29  ALT 23  ALKPHOS 55  BILITOT 1.1    ------------------------------------------------------------------------------------------------------------------  Cardiac Enzymes No results for input(s): TROPONINI in the last 168 hours. ------------------------------------------------------------------------------------------------------------------  RADIOLOGY:  Dg Chest Port 1 View  Result Date: 04/14/2017 CLINICAL DATA:  Acute onset of cough and shortness of breath. Initial encounter. EXAM: PORTABLE CHEST 1 VIEW COMPARISON:  Chest radiograph performed 12/07/2016 FINDINGS: The lungs are well-aerated. Mild right basilar airspace opacity raises concern for pneumonia. There is no evidence of pleural effusion or pneumothorax. The cardiomediastinal silhouette is mildly enlarged. A pacemaker is noted overlying the left chest wall, with leads ending overlying the right atrium and right ventricle. No acute osseous abnormalities are seen. IMPRESSION: Mild right basilar airspace opacity raises concern for pneumonia. Mild cardiomegaly. Electronically Signed   By: Garald Balding M.D.   On: 04/14/2017 00:05    EKG:   Orders placed or performed during the hospital encounter of 04/13/17  . ED EKG 12-Lead  . ED EKG 12-Lead  . EKG 12-Lead  . EKG 12-Lead    IMPRESSION AND PLAN:  Principal Problem:   Sepsis ([place]) - IV antibiotics, lactic acid was within normal limits, patient is hemodynamically stable, cultures sent from the ED Active Problems:   CAP (community acquired pneumonia) - IV antibiotics and when necessary supportive medications   Atrial fibrillation ([place]) - continue home rate controlling meds and anticoagulation   Essential hypertension - continue home meds   Diabetes ([place]) - sliding scale insulin with corresponding glucose checks   COPD (chronic obstructive pulmonary disease) ([place]) - home dose inhalers  All the records are reviewed and case discussed with ED provider. Management plans discussed with the patient and/or  family.  DVT PROPHYLAXIS: SubQ lovenox  GI PROPHYLAXIS: None  ADMISSION STATUS: Inpatient  CODE STATUS: Full Code Status History    Date Active Date Inactive Code Status Order ID Comments User Context   12/08/2016  1:07 AM 12/09/2016  8:31 PM Full Code [id number]  Lance Coon, MD  ED   09/07/2015  4:41 AM 09/13/2015  7:55 PM Full Code [id number]  Saundra Shelling, MD Inpatient   09/01/2015  3:33 PM 09/03/2015  7:07 PM Full Code [id number]  Epifanio Lesches, MD ED   03/21/2015  6:26 AM 03/22/2015  7:39 PM Full Code [id number]  Harrie Foreman, MD Inpatient    Advance Directive Documentation     Most Recent Value  Type of Advance Directive  Healthcare Power of Attorney, Living will  Pre-existing out of facility DNR order (yellow form or pink MOST form)  -  "MOST" Form in Place?  -      TOTAL TIME TAKING CARE OF THIS PATIENT: 45 minutes.   Nitasha Jewel FIELDING 04/14/2017, 1:57 AM  [facility]  Office  [phone]  CC: Primary care physician; Tracie Harrier, MD  Note:  This document was prepared using Dragon voice recognition software and may include unintentional dictation errors.

## 2017-04-14 NOTE — Progress Notes (Signed)
PHARMACY - PHYSICIAN COMMUNICATION CRITICAL VALUE ALERT - BLOOD CULTURE IDENTIFICATION (BCID)  Results for orders placed or performed during the hospital encounter of 04/13/17  Blood Culture ID Panel (Reflexed) (Collected: 04/13/2017 11:57 PM)  Result Value Ref Range   Enterococcus species NOT DETECTED NOT DETECTED   Listeria monocytogenes NOT DETECTED NOT DETECTED   Staphylococcus species NOT DETECTED NOT DETECTED   Staphylococcus aureus NOT DETECTED NOT DETECTED   Streptococcus species NOT DETECTED NOT DETECTED   Streptococcus agalactiae NOT DETECTED NOT DETECTED   Streptococcus pneumoniae NOT DETECTED NOT DETECTED   Streptococcus pyogenes NOT DETECTED NOT DETECTED   Acinetobacter baumannii NOT DETECTED NOT DETECTED   Enterobacteriaceae species DETECTED (A) NOT DETECTED   Enterobacter cloacae complex NOT DETECTED NOT DETECTED   Escherichia coli DETECTED (A) NOT DETECTED   Klebsiella oxytoca NOT DETECTED NOT DETECTED   Klebsiella pneumoniae NOT DETECTED NOT DETECTED   Proteus species NOT DETECTED NOT DETECTED   Serratia marcescens NOT DETECTED NOT DETECTED   Carbapenem resistance NOT DETECTED NOT DETECTED   Haemophilus influenzae NOT DETECTED NOT DETECTED   Neisseria meningitidis NOT DETECTED NOT DETECTED   Pseudomonas aeruginosa NOT DETECTED NOT DETECTED   Candida albicans NOT DETECTED NOT DETECTED   Candida glabrata NOT DETECTED NOT DETECTED   Candida krusei NOT DETECTED NOT DETECTED   Candida parapsilosis NOT DETECTED NOT DETECTED   Candida tropicalis NOT DETECTED NOT DETECTED    Name of physician (or Provider) Contacted: Dr Margaretmary Eddy  Changes to prescribed antibiotics required: merrem 1gm iv q12h, Dr Margaretmary Eddy okay to try with cephalosporin allergy. RN Gerald Stabs will monitor closely.   [name] 04/14/2017  2:23 PM

## 2017-04-14 NOTE — ED Notes (Signed)
Pt took Tylenol with sips of water without difficulty

## 2017-04-14 NOTE — Progress Notes (Signed)
ANTIBIOTIC CONSULT NOTE - INITIAL  Pharmacy Consult for Levaquin , Vancomycin  Indication: pneumonia  Allergies  Allergen Reactions  . Ceftriaxone Swelling  . Cefuroxime Hives  . Cephalosporins Swelling and Other (See Comments)    Reaction:  Fainting/dry mouth   . Codeine Nausea And Vomiting  . Epinephrine Other (See Comments)    Reaction:  Fainting   . Morphine And Related Other (See Comments)    Reaction:  GI upset   . Buprenorphine Hcl Nausea And Vomiting  . Ciprofloxacin Itching, Swelling and Rash  . Latex Rash  . Lidocaine Rash  . Procaine Rash    Patient Measurements: Height: 5\' 7"  (170.2 cm) Weight: 174 lb (78.9 kg) IBW/kg (Calculated) : 61.6 Adjusted Body Weight: 68.5 kg   Vital Signs: Temp: 97.4 F (36.3 C) (10/22 0443) Temp Source: Axillary (10/22 0443) BP: 114/45 (10/22 0443) Pulse Rate: 75 (10/22 0443) Intake/Output from previous day: 10/21 0701 - 10/22 0700 In: 300 [IV Piggyback:300] Out: -  Intake/Output from this shift: Total I/O In: 300 [IV Piggyback:300] Out: -   Labs:  Recent Labs  04/13/17 2356  WBC 13.8*  HGB 12.0  PLT 137*  CREATININE 1.15*   Estimated Creatinine Clearance: 35.9 mL/min (A) (by C-G formula based on SCr of 1.15 mg/dL (H)). No results for input(s): VANCOTROUGH, VANCOPEAK, VANCORANDOM, GENTTROUGH, GENTPEAK, GENTRANDOM, TOBRATROUGH, TOBRAPEAK, TOBRARND, AMIKACINPEAK, AMIKACINTROU, AMIKACIN in the last 72 hours.   Microbiology: No results found for this or any previous visit (from the past 720 hour(s)).  Medical History: Past Medical History:  Diagnosis Date  . Cancer ([place])    skin ca  . CHF (congestive heart failure) ([place])   . Chronic back pain   . COPD (chronic obstructive pulmonary disease) ([place])   . Diabetes mellitus without complication ([place])   . Hypertension   . Poor perfusion of leg     Medications:  Prescriptions Prior to Admission  Medication Sig Dispense Refill Last Dose  . aspirin EC 81 MG tablet  Take 81 mg by mouth daily.   04/13/2017 at Unknown time  . carvedilol (COREG) 6.25 MG tablet Take 6.25 mg by mouth 2 (two) times daily with a meal.    04/13/2017 at Unknown time  . clopidogrel (PLAVIX) 75 MG tablet Take 75 mg by mouth daily.   04/13/2017 at Unknown time  . docusate sodium (COLACE) 100 MG capsule Take 100 mg by mouth 2 (two) times daily.    04/13/2017 at Unknown time  . fentaNYL (DURAGESIC - DOSED MCG/HR) 50 MCG/HR Place 50 mcg onto the skin every other day.    04/13/2017 at Unknown time  . ferrous sulfate 325 (65 FE) MG tablet Take 325 mg by mouth daily with breakfast.   04/13/2017 at Unknown time  . furosemide (LASIX) 40 MG tablet Take 1 tablet (40 mg total) by mouth daily. Pt alternates with the 80mg  tablet. 30 tablet 0 04/13/2017 at Unknown time  . glimepiride (AMARYL) 4 MG tablet Take 4 mg by mouth 2 (two) times daily after a meal.   04/13/2017 at Unknown time  . isosorbide mononitrate (IMDUR) 30 MG 24 hr tablet Take 30 mg by mouth daily. 1/2 tablet daily     . losartan (COZAAR) 25 MG tablet Take 25 mg by mouth daily.   04/13/2017 at Unknown time  . magnesium oxide (MAG-OX) 400 MG tablet Take 400 mg by mouth daily.   04/13/2017 at Unknown time  . Multiple Vitamin (MULTIVITAMIN) tablet Take 1 tablet by mouth daily.     Marland Kitchen  oxyCODONE (OXY IR/ROXICODONE) 5 MG immediate release tablet Take 5-10 mg by mouth every 4 (four) hours as needed for severe pain.    04/13/2017 at Unknown time  . pantoprazole (PROTONIX) 40 MG tablet Take 40 mg by mouth daily.   04/13/2017 at Unknown time  . potassium chloride (KLOR-CON) 20 MEQ packet Take by mouth 2 (two) times daily.   04/13/2017 at Unknown time  . sitaGLIPtin (JANUVIA) 100 MG tablet Take 100 mg by mouth daily.   04/13/2017 at Unknown time  . vitamin B-12 (CYANOCOBALAMIN) 1000 MCG tablet Take 1,000 mcg by mouth daily.   04/13/2017 at Unknown time  . vitamin C (ASCORBIC ACID) 500 MG tablet Take 500 mg by mouth daily.   04/13/2017 at Unknown time   . Vitamin Hoffman, Ergocalciferol, (DRISDOL) 50000 UNITS CAPS capsule Take 50,000 Units by mouth every 7 (seven) days. Pt takes on Thursday.   04/13/2017 at Unknown time  . albuterol (PROVENTIL HFA;VENTOLIN HFA) 108 (90 Base) MCG/ACT inhaler Inhale 1-2 puffs into the lungs every 6 (six) hours as needed for wheezing or shortness of breath. (Patient not taking: Reported on 04/14/2017) 1 Inhaler 0 Not Taking at Unknown time  . apixaban (ELIQUIS) 5 MG TABS tablet Take 5 mg by mouth 2 (two) times daily.   Not Taking at Unknown time  . baclofen (LIORESAL) 10 MG tablet Take 10 mg by mouth 3 (three) times daily.   Not Taking at Unknown time  . budesonide-formoterol (SYMBICORT) 160-4.5 MCG/ACT inhaler Inhale 2 puffs into the lungs 2 (two) times daily. (Patient not taking: Reported on 04/14/2017) 1 Inhaler 12 Not Taking  . cholecalciferol (VITAMIN Hoffman) 1000 units tablet Take 1,000 Units by mouth daily.   Not Taking at Unknown time  . escitalopram (LEXAPRO) 10 MG tablet Take 10 mg by mouth daily.   Not Taking at Unknown time  . insulin aspart (NOVOLOG FLEXPEN) 100 UNIT/ML FlexPen Inject 5 Units into the skin daily before lunch. IF blood sugar is greater than 200 15 mL 11 12/07/2016 at Unknown time  . Melatonin 1 MG TABS Take 1 mg by mouth at bedtime.   12/06/2016 at Unknown time  . metolazone (ZAROXOLYN) 2.5 MG tablet Take 2.5 mg by mouth 2 (two) times a week. Pt takes on Monday and Friday.   12/06/2016 at Unknown time  . rOPINIRole (REQUIP) 0.5 MG tablet Take 0.5 mg by mouth 2 (two) times daily.   Not Taking at Unknown time  . spironolactone (ALDACTONE) 25 MG tablet Take 25 mg by mouth daily.    Not Taking at Unknown time   Assessment: CrCl = 35.9 ml/min Ke = 0.034 hr-1 T1/2 = 20.4 hrs Vd = 48 L   Goal of Therapy:  Vancomycin trough level 15-20 mcg/ml  Plan:  Expected duration 7 days with resolution of temperature and/or normalization of WBC   Levaquin 500 mg IV X 1 given on 10/22 @ 0014. Levaquin 500 mg IV  Q24H ordered to continue on 10/22 @ 18:00.  Vancomycin 1 gm IV X 1 given on 10/22 @ 0036. Vancomycin 1 gm IV Q24H ordered to start on 10/22 @ 0600. This pt will reach Css by 10/26 @ 00:00. Will draw 1st trough on 10/26 @ 0530.   [name] 04/14/2017,4:58 AM

## 2017-04-14 NOTE — ED Triage Notes (Signed)
Pt arrived from home via EMS for c/o shortness of breath for 1 day; pt arrived on NRB; pt weak but answering some questions and following commands before nodding off to sleep; skin pale; resp 30; oral temp 99.7 per EMS but pt feels much warmer; pt noted to have cellulitis to left lower leg and dressing to foot; Dr Owens Shark at bedside

## 2017-04-14 NOTE — ED Notes (Addendum)
Pt assisted to laying on right side; restless at times but husband at bedside and is able to calm pt; pt alert and oriented x 3;

## 2017-04-14 NOTE — ED Notes (Signed)
Husband at bedside; anxious about wife's condition; answered questions and discussed plan of care;

## 2017-04-14 NOTE — Progress Notes (Signed)
[facility] [name]    MR#:  [id number]  DATE OF BIRTH:  1925/03/10  SUBJECTIVE:  CHIEF COMPLAINT:  Patient is resting comfortably. Daughter at bedside. Takes Xarelto at home for chronic atrial fibrillation. Left lower extremity is relatively swollen and warm to touch  REVIEW OF SYSTEMS:  CONSTITUTIONAL: No fever, fatigue or weakness.  EYES: No blurred or double vision.  EARS, NOSE, AND THROAT: No tinnitus or ear pain.  RESPIRATORY: No cough, shortness of breath, wheezing or hemoptysis.  CARDIOVASCULAR: No chest pain, orthopnea, edema.  GASTROINTESTINAL: No nausea, vomiting, diarrhea or abdominal pain.  GENITOURINARY: No dysuria, hematuria.  ENDOCRINE: No polyuria, nocturia,  HEMATOLOGY: No anemia, easy bruising or bleeding SKIN: No rash or lesion. MUSCULOSKELETAL: Left leg is swollen and warm to touch  Neurology denies any weakness  PSYCHIATRY: No anxiety or depression.   DRUG ALLERGIES:   Allergies  Allergen Reactions  . Ceftriaxone Swelling  . Cefuroxime Hives  . Cephalosporins Swelling and Other (See Comments)    Reaction:  Fainting/dry mouth   . Codeine Nausea And Vomiting  . Epinephrine Other (See Comments)    Reaction:  Fainting   . Morphine And Related Other (See Comments)    Reaction:  GI upset   . Buprenorphine Hcl Nausea And Vomiting  . Ciprofloxacin Itching, Swelling and Rash  . Latex Rash  . Lidocaine Rash  . Procaine Rash    VITALS:  Blood pressure (!) 110/42, pulse 68, temperature 97.6 F (36.4 C), temperature source Oral, resp. rate 18, height 5\' 7"  (1.702 m), weight 78.9 kg (174 lb), SpO2 100 %.  PHYSICAL EXAMINATION:  GENERAL:  83 y.o.-year-old patient lying in the bed with no acute distress.  EYES: Pupils equal, round, reactive to light and accommodation. No scleral icterus. Extraocular muscles intact.  HEENT: Head atraumatic, normocephalic. Oropharynx and nasopharynx clear.   NECK:  Supple, no jugular venous distention. No thyroid enlargement, no tenderness.  LUNGS: Normal breath sounds bilaterally, no wheezing, rales,rhonchi or crepitation. No use of accessory muscles of respiration.  CARDIOVASCULAR: S1, S2 normal. No murmurs, rubs, or gallops.  ABDOMEN: Soft, nontender, nondistended. Bowel sounds present. No organomegaly or mass.  EXTREMITIES:  left lower extremity with chronic venous changes but edematous and warm to touch No pedal edema, cyanosis, or clubbing.  NEUROLOGIC: Cranial nerves II through XII are intact. Muscle strength at her base line Sensation intact. Gait not checked.  PSYCHIATRIC: The patient is alert and oriented x 3.  SKIN: No obvious rash, lesion, or ulcer.    LABORATORY PANEL:   CBC  Recent Labs Lab 04/14/17 0520  WBC 12.6*  HGB 10.6*  HCT 33.0*  PLT 103*   ------------------------------------------------------------------------------------------------------------------  Chemistries   Recent Labs Lab 04/13/17 2356 04/14/17 0520  NA 136 135  K 5.0 4.9  CL 102 105  CO2 26 25  GLUCOSE 360* 335*  BUN 47* 47*  CREATININE 1.15* 1.31*  CALCIUM 8.8* 8.3*  AST 29  --   ALT 23  --   ALKPHOS 55  --   BILITOT 1.1  --    ------------------------------------------------------------------------------------------------------------------  Cardiac Enzymes No results for input(s): TROPONINI in the last 168 hours. ------------------------------------------------------------------------------------------------------------------  RADIOLOGY:  US Venous Img Lower Unilateral Left  Result Date: 04/14/2017 CLINICAL DATA:  Left lower extremity edema.  History of prior DVT. EXAM: LEFT LOWER EXTREMITY VENOUS DOPPLER ULTRASOUND TECHNIQUE: Gray-scale sonography with graded compression, as well as color Doppler and duplex ultrasound  were performed to evaluate the lower extremity deep venous systems from the level of the common femoral vein  and including the common femoral, femoral, profunda femoral, popliteal and calf veins including the posterior tibial, peroneal and gastrocnemius veins when visible. The superficial great saphenous vein was also interrogated. Spectral Doppler was utilized to evaluate flow at rest and with distal augmentation maneuvers in the common femoral, femoral and popliteal veins. COMPARISON:  06/29/2015 FINDINGS: Contralateral Common Femoral Vein: Respiratory phasicity is normal and symmetric with the symptomatic side. No evidence of thrombus. Normal compressibility. Common Femoral Vein: No evidence of thrombus. Normal compressibility, respiratory phasicity and response to augmentation. Saphenofemoral Junction: No evidence of thrombus. Normal compressibility and flow on color Doppler imaging. Profunda Femoral Vein: No evidence of thrombus. Normal compressibility and flow on color Doppler imaging. Femoral Vein: No evidence of thrombus. Normal compressibility, respiratory phasicity and response to augmentation. Popliteal Vein: No evidence of thrombus. Normal compressibility, respiratory phasicity and response to augmentation. Calf Veins: No evidence of thrombus. Normal compressibility and flow on color Doppler imaging. Superficial Great Saphenous Vein: No evidence of thrombus. Normal compressibility. Venous Reflux:  None. Other Findings: No evidence of superficial thrombophlebitis or abnormal fluid collection. IMPRESSION: No evidence of left lower extremity deep venous thrombosis. Electronically Signed   By: Aletta Edouard M.D.   On: 04/14/2017 13:56   Dg Chest Port 1 View  Result Date: 04/14/2017 CLINICAL DATA:  Acute onset of cough and shortness of breath. Initial encounter. EXAM: PORTABLE CHEST 1 VIEW COMPARISON:  Chest radiograph performed 12/07/2016 FINDINGS: The lungs are well-aerated. Mild right basilar airspace opacity raises concern for pneumonia. There is no evidence of pleural effusion or pneumothorax. The  cardiomediastinal silhouette is mildly enlarged. A pacemaker is noted overlying the left chest wall, with leads ending overlying the right atrium and right ventricle. No acute osseous abnormalities are seen. IMPRESSION: Mild right basilar airspace opacity raises concern for pneumonia. Mild cardiomegaly. Electronically Signed   By: Garald Balding M.D.   On: 04/14/2017 00:05    EKG:   Orders placed or performed during the hospital encounter of 04/13/17  . ED EKG 12-Lead  . ED EKG 12-Lead  . EKG 12-Lead  . EKG 12-Lead    ASSESSMENT AND PLAN:   Sepsis ([place]) - was septic criteria with fever and hypotension at the time of admission Blood cultures are growing Escherichia coli, also has community-acquired pneumonia Initial IV antibiotics levofloxacin and vancomycin have been changed to meropenem lactic acid was within normal limits, patient is hemodynamically stable    CAP (community acquired pneumonia) - IV antibiotics and nebulizers as needed  Left lower extremity swelling-rule out DVT with venous Dopplers     Atrial fibrillation ([place]) - continue  rate controlling meds and anticoagulation with Xarelto    Essential hypertension -hold antihypertensives as patient blood pressure is soft    Diabetes (HCC) - sliding scale insulin with corresponding glucose checks Patient is started on Lantus 8 units daily for basal coverage    COPD (chronic obstructive pulmonary disease) ([place]) - home dose inhalers   Generalized weakness PT evaluation   All the records are reviewed and case discussed with Care Management/Social Workerr. Management plans discussed with the patient, daughter at bedside and RN  they are in agreement.  CODE STATUS: fc   TOTAL TIME TAKING CARE OF THIS PATIENT: minutes.   POSSIBLE D/C IN 2-3 DAYS, DEPENDING ON CLINICAL CONDITION.  Note: This dictation was prepared with Dragon dictation along with smaller phrase technology.  Any transcriptional errors that result from this  process are unintentional.   Nicholes Mango M.D on 04/14/2017 at 2:55 PM  Between 7am to 6pm - Pager - [phone] After 6pm go to [email or web address] - password [email or web address] [facility]  Office  [phone]  CC: Primary care physician; Tracie Harrier, MD

## 2017-04-14 NOTE — ED Notes (Signed)
Pt sleeping; husband unable to verify pt's medications; he believes she's had a medication or 2 changed since list was last reconciled in June; husband says list was given to EMS but list was not given to staff; pt gets medications filled at [facility] in [place]

## 2017-04-14 NOTE — Progress Notes (Signed)
ANTICOAGULATION CONSULT NOTE - Initial Consult  Pharmacy Consult for xarelto Indication: atrial fibrillation  Allergies  Allergen Reactions  . Ceftriaxone Swelling  . Cefuroxime Hives  . Cephalosporins Swelling and Other (See Comments)    Reaction:  Fainting/dry mouth   . Codeine Nausea And Vomiting  . Epinephrine Other (See Comments)    Reaction:  Fainting   . Morphine And Related Other (See Comments)    Reaction:  GI upset   . Buprenorphine Hcl Nausea And Vomiting  . Ciprofloxacin Itching, Swelling and Rash  . Latex Rash  . Lidocaine Rash  . Procaine Rash    Patient Measurements: Height: 5\' 7"  (170.2 cm) Weight: 174 lb (78.9 kg) IBW/kg (Calculated) : 61.6  Serum creatinine: 1.31 mg/dL (H) 04/14/17 0520 Estimated creatinine clearance: 31.5 mL/min (A) CrCl on actual body weight: 36.70ml/min  Vital Signs: Temp: 97.5 F (36.4 C) (10/22 0758) Temp Source: Oral (10/22 0758) BP: 107/45 (10/22 1005) Pulse Rate: 71 (10/22 0758)  Labs:  Recent Labs  04/13/17 2356 04/14/17 0520  HGB 12.0 10.6*  HCT 38.0 33.0*  PLT 137* 103*  CREATININE 1.15* 1.31*    Estimated Creatinine Clearance: 31.5 mL/min (A) (by C-G formula based on SCr of 1.31 mg/dL (H)).   Medical History: Past Medical History:  Diagnosis Date  . Cancer ([place])    skin ca  . CHF (congestive heart failure) ([place])   . Chronic back pain   . COPD (chronic obstructive pulmonary disease) ([place])   . Diabetes mellitus without complication ([place])   . Hypertension   . Poor perfusion of leg     Medications:  Prescriptions Prior to Admission  Medication Sig Dispense Refill Last Dose  . aspirin EC 81 MG tablet Take 81 mg by mouth daily.   04/13/2017 at Unknown time  . baclofen (LIORESAL) 10 MG tablet Take 10 mg by mouth 3 (three) times daily.   Past Week at Unknown time  . carvedilol (COREG) 6.25 MG tablet Take 6.25 mg by mouth 2 (two) times daily with a meal.    Past Week at Unknown time  . cholecalciferol  (VITAMIN D) 1000 units tablet Take 1,000 Units by mouth daily.   Past Week at Unknown time  . docusate sodium (COLACE) 100 MG capsule Take 100 mg by mouth 2 (two) times daily.    Past Week at Unknown time  . escitalopram (LEXAPRO) 10 MG tablet Take 10 mg by mouth daily.   Past Week at Unknown time  . fentaNYL (DURAGESIC - DOSED MCG/HR) 25 MCG/HR patch Place 25 mcg onto the skin every 3 (three) days.    Past Week at Unknown time  . ferrous sulfate 325 (65 FE) MG tablet Take 325 mg by mouth daily with breakfast.   Past Week at Unknown time  . furosemide (LASIX) 40 MG tablet Take 1 tablet (40 mg total) by mouth daily. Pt alternates with the 80mg  tablet. (Patient taking differently: Take 40-80 mg by mouth daily. Pt alternates with the 80mg  tablet. ) 30 tablet 0 Past Week at Unknown time  . gabapentin (NEURONTIN) 100 MG capsule Take 100-200 mg by mouth at bedtime.   Past Week at Unknown time  . glimepiride (AMARYL) 4 MG tablet Take 4 mg by mouth 2 (two) times daily after a meal.   Past Week at Unknown time  . magnesium oxide (MAG-OX) 400 MG tablet Take 400 mg by mouth daily.   Past Week at Unknown time  . Melatonin 1 MG TABS Take 1 mg  by mouth at bedtime.   Past Week at Unknown time  . Multiple Vitamin (MULTIVITAMIN) tablet Take 1 tablet by mouth daily.   Past Week at Unknown time  . oxyCODONE (OXY IR/ROXICODONE) 5 MG immediate release tablet Take 5 mg by mouth 2 (two) times daily as needed for severe pain.   prn at prn  . pantoprazole (PROTONIX) 40 MG tablet Take 40 mg by mouth daily.   Past Week at Unknown time  . potassium chloride (KLOR-CON) 20 MEQ packet Take by mouth 2 (two) times daily.   Past Week at Unknown time  . Rivaroxaban (XARELTO) 15 MG TABS tablet Take 15 mg by mouth daily.   Past Week at Unknown time  . rOPINIRole (REQUIP) 0.5 MG tablet Take 0.5 mg by mouth 2 (two) times daily.   Past Week at Unknown time  . sitaGLIPtin (JANUVIA) 100 MG tablet Take 100 mg by mouth daily.   Past Week at  Unknown time  . spironolactone (ALDACTONE) 25 MG tablet Take 25 mg by mouth daily.    Past Week at Unknown time  . vitamin B-12 (CYANOCOBALAMIN) 1000 MCG tablet Take 1,000 mcg by mouth daily.   Past Week at Unknown time  . vitamin C (ASCORBIC ACID) 500 MG tablet Take 500 mg by mouth daily.   Past Week at Unknown time  . Vitamin D, Ergocalciferol, (DRISDOL) 50000 UNITS CAPS capsule Take 50,000 Units by mouth every 7 (seven) days. Pt takes on Thursday.   Past Week at Unknown time  . albuterol (PROVENTIL HFA;VENTOLIN HFA) 108 (90 Base) MCG/ACT inhaler Inhale 1-2 puffs into the lungs every 6 (six) hours as needed for wheezing or shortness of breath. (Patient not taking: Reported on 04/14/2017) 1 Inhaler 0 Not Taking at Unknown time  . budesonide-formoterol (SYMBICORT) 160-4.5 MCG/ACT inhaler Inhale 2 puffs into the lungs 2 (two) times daily. (Patient not taking: Reported on 04/14/2017) 1 Inhaler 12 Not Taking  . insulin aspart (NOVOLOG FLEXPEN) 100 UNIT/ML FlexPen Inject 5 Units into the skin daily before lunch. IF blood sugar is greater than 200 (Patient not taking: Reported on 04/14/2017) 15 mL 11 Not Taking at Unknown time   Scheduled:  . carvedilol  6.25 mg Oral BID WC  . escitalopram  10 mg Oral Daily  . insulin aspart  0-5 Units Subcutaneous QHS  . insulin aspart  0-9 Units Subcutaneous TID WC  . mometasone-formoterol  2 puff Inhalation BID  . pantoprazole  40 mg Oral Daily  . rivaroxaban  15 mg Oral Q supper  . spironolactone  25 mg Oral Daily   Infusions:  . sodium chloride 75 mL/hr at 04/14/17 0441  . [START ON 04/15/2017] levofloxacin (LEVAQUIN) IV    . vancomycin Stopped (04/14/17 0725)   PRN: acetaminophen **OR** acetaminophen, ondansetron **OR** ondansetron (ZOFRAN) IV, oxyCODONE Anti-infectives    Start     Dose/Rate Route Frequency Ordered Stop   04/15/17 0500  levofloxacin (LEVAQUIN) IVPB 750 mg     750 mg 100 mL/hr over 90 Minutes Intravenous Every 48 hours 04/14/17 0844      04/14/17 1800  levofloxacin (LEVAQUIN) IVPB 500 mg  Status:  Discontinued     500 mg 100 mL/hr over 60 Minutes Intravenous Every 24 hours 04/14/17 0427 04/14/17 0844   04/14/17 0600  vancomycin (VANCOCIN) IVPB 1000 mg/200 mL premix     1,000 mg 200 mL/hr over 60 Minutes Intravenous Every 24 hours 04/14/17 0443     04/14/17 0000  vancomycin (VANCOCIN) IVPB 1000 mg/200  mL premix     1,000 mg 200 mL/hr over 60 Minutes Intravenous  Once 04/13/17 2350 04/14/17 0315   04/14/17 0000  levofloxacin (LEVAQUIN) IVPB 500 mg     500 mg 100 mL/hr over 60 Minutes Intravenous  Once 04/13/17 2350 04/14/17 0315      Assessment: 83 year old woman with afib, pharmacy consulted for xarelto dosing in the presence of afib.    Goal of Therapy:  anticoagulation on xarelto Monitor platelets by anticoagulation protocol: Yes   Plan:  Xarelto 15mg  po once each evening with supper based on creatinine clearance between 15 and 2ml/min. Will monitor CBC for signs of bleeding and Scr for renal fx.   Thomasenia Sales, PharmD, MBA, [employer] Clinical Pharmacist [facility]     04/14/2017,1:08 PM

## 2017-04-14 NOTE — Plan of Care (Signed)
Problem: Education: Goal: Knowledge of [facility] Education information/materials will improve Outcome: Progressing Pt likes to be called [place]  Past Medical History:  Diagnosis Date  . Cancer ([place])    skin ca  . CHF (congestive heart failure) ([place])   . Chronic back pain   . COPD (chronic obstructive pulmonary disease) ([place])   . Diabetes mellitus without complication ([place])   . Hypertension   . Poor perfusion of leg    Pt is well controlled with home medications

## 2017-04-14 NOTE — ED Notes (Addendum)
Husband remains at bedside; pt occasionally wakes and moves to sit up then realizes where she is; waiting for admitting room

## 2017-04-14 NOTE — ED Notes (Addendum)
Admitting MD at bedside; some redness to skin noted above IV to left hand where Levaquin is infusing; IV slowed down per MD order, says pt has had Levaquin before without difficulty; verbal order given for Tylenol 650mg  PO for fever

## 2017-04-15 LAB — CBC
HCT: 32.8 % — ABNORMAL LOW (ref 35.0–47.0)
Hemoglobin: 10.7 g/dL — ABNORMAL LOW (ref 12.0–16.0)
MCH: 30.6 pg (ref 26.0–34.0)
MCHC: 32.7 g/dL (ref 32.0–36.0)
MCV: 93.4 fL (ref 80.0–100.0)
PLATELETS: 86 10*3/uL — AB (ref 150–440)
RBC: 3.51 MIL/uL — AB (ref 3.80–5.20)
RDW: 14.9 % — ABNORMAL HIGH (ref 11.5–14.5)
WBC: 8.6 10*3/uL (ref 3.6–11.0)

## 2017-04-15 LAB — GLUCOSE, CAPILLARY
GLUCOSE-CAPILLARY: 106 mg/dL — AB (ref 65–99)
Glucose-Capillary: 181 mg/dL — ABNORMAL HIGH (ref 65–99)
Glucose-Capillary: 226 mg/dL — ABNORMAL HIGH (ref 65–99)
Glucose-Capillary: 192 mg/dL — ABNORMAL HIGH (ref 65–99)

## 2017-04-15 LAB — BASIC METABOLIC PANEL
Anion gap: 4 — ABNORMAL LOW (ref 5–15)
BUN: 37 mg/dL — AB (ref 6–20)
CALCIUM: 8.3 mg/dL — AB (ref 8.9–10.3)
CO2: 27 mmol/L (ref 22–32)
CREATININE: 1.11 mg/dL — AB (ref 0.44–1.00)
Chloride: 105 mmol/L (ref 101–111)
GFR calc Af Amer: 50 mL/min — ABNORMAL LOW (ref >60)
GFR, EST NON AFRICAN AMERICAN: 43 mL/min — AB (ref >60)
Glucose, Bld: 134 mg/dL — ABNORMAL HIGH (ref 65–99)
POTASSIUM: 4.7 mmol/L (ref 3.5–5.1)
SODIUM: 136 mmol/L (ref 135–145)

## 2017-04-15 NOTE — Progress Notes (Signed)
[facility] [place] [name]    MR#:  [id number]  DATE OF BIRTH:  03/29/1925  SUBJECTIVE:  CHIEF COMPLAINT:  Patient is resting comfortably. Husband at bedside. Takes Xarelto at home for chronic atrial fibrillation. Left lower extremity is relatively swollen and warm to touch, no other complaints  REVIEW OF SYSTEMS:  CONSTITUTIONAL: No fever, fatigue or weakness.  EYES: No blurred or double vision.  EARS, NOSE, AND THROAT: No tinnitus or ear pain.  RESPIRATORY: No cough, shortness of breath, wheezing or hemoptysis.  CARDIOVASCULAR: No chest pain, orthopnea, edema.  GASTROINTESTINAL: No nausea, vomiting, diarrhea or abdominal pain.  GENITOURINARY: No dysuria, hematuria.  ENDOCRINE: No polyuria, nocturia,  HEMATOLOGY: No anemia, easy bruising or bleeding SKIN: No rash or lesion. MUSCULOSKELETAL: Left leg is swollen and warm to touch  Neurology denies any weakness  PSYCHIATRY: No anxiety or depression.   DRUG ALLERGIES:   Allergies  Allergen Reactions  . Ceftriaxone Swelling  . Cefuroxime Hives  . Cephalosporins Swelling and Other (See Comments)    Reaction:  Fainting/dry mouth   . Codeine Nausea And Vomiting  . Epinephrine Other (See Comments)    Reaction:  Fainting   . Morphine And Related Other (See Comments)    Reaction:  GI upset   . Buprenorphine Hcl Nausea And Vomiting  . Ciprofloxacin Itching, Swelling and Rash  . Latex Rash  . Lidocaine Rash  . Procaine Rash    VITALS:  Blood pressure (!) 115/54, pulse 76, temperature 98.1 F (36.7 C), temperature source Oral, resp. rate 18, height 5\' 7"  (1.702 m), weight 78.9 kg (174 lb), SpO2 97 %.  PHYSICAL EXAMINATION:  GENERAL:  83 y.o.-year-old patient lying in the bed with no acute distress.  EYES: Pupils equal, round, reactive to light and accommodation. No scleral icterus. Extraocular muscles intact.  HEENT: Head atraumatic, normocephalic. Oropharynx and  nasopharynx clear.  NECK:  Supple, no jugular venous distention. No thyroid enlargement, no tenderness.  LUNGS: Normal breath sounds bilaterally, no wheezing, rales,rhonchi or crepitation. No use of accessory muscles of respiration.  CARDIOVASCULAR: S1, S2 normal. No murmurs, rubs, or gallops.  ABDOMEN: Soft, nontender, nondistended. Bowel sounds present. No organomegaly or mass.  EXTREMITIES:  left lower extremity with chronic venous changes but edematous and warm to touch No pedal edema, cyanosis, or clubbing.  NEUROLOGIC: Cranial nerves II through XII are intact. Muscle strength at her base line Sensation intact. Gait not checked.  PSYCHIATRIC: The patient is alert and oriented x 3.  SKIN: No obvious rash, lesion, or ulcer.    LABORATORY PANEL:   CBC  Recent Labs Lab 04/15/17 0404  WBC 8.6  HGB 10.7*  HCT 32.8*  PLT 86*   ------------------------------------------------------------------------------------------------------------------  Chemistries   Recent Labs Lab 04/13/17 2356  04/15/17 0404  NA 136  < > 136  K 5.0  < > 4.7  CL 102  < > 105  CO2 26  < > 27  GLUCOSE 360*  < > 134*  BUN 47*  < > 37*  CREATININE 1.15*  < > 1.11*  CALCIUM 8.8*  < > 8.3*  AST 29  --   --   ALT 23  --   --   ALKPHOS 55  --   --   BILITOT 1.1  --   --   < > = values in this interval not displayed. ------------------------------------------------------------------------------------------------------------------  Cardiac Enzymes No results for input(s): TROPONINI in the last 168  hours. ------------------------------------------------------------------------------------------------------------------  RADIOLOGY:  US Venous Img Lower Unilateral Left  Result Date: 04/14/2017 CLINICAL DATA:  Left lower extremity edema.  History of prior DVT. EXAM: LEFT LOWER EXTREMITY VENOUS DOPPLER ULTRASOUND TECHNIQUE: Gray-scale sonography with graded compression, as well as color Doppler and duplex  ultrasound were performed to evaluate the lower extremity deep venous systems from the level of the common femoral vein and including the common femoral, femoral, profunda femoral, popliteal and calf veins including the posterior tibial, peroneal and gastrocnemius veins when visible. The superficial great saphenous vein was also interrogated. Spectral Doppler was utilized to evaluate flow at rest and with distal augmentation maneuvers in the common femoral, femoral and popliteal veins. COMPARISON:  06/29/2015 FINDINGS: Contralateral Common Femoral Vein: Respiratory phasicity is normal and symmetric with the symptomatic side. No evidence of thrombus. Normal compressibility. Common Femoral Vein: No evidence of thrombus. Normal compressibility, respiratory phasicity and response to augmentation. Saphenofemoral Junction: No evidence of thrombus. Normal compressibility and flow on color Doppler imaging. Profunda Femoral Vein: No evidence of thrombus. Normal compressibility and flow on color Doppler imaging. Femoral Vein: No evidence of thrombus. Normal compressibility, respiratory phasicity and response to augmentation. Popliteal Vein: No evidence of thrombus. Normal compressibility, respiratory phasicity and response to augmentation. Calf Veins: No evidence of thrombus. Normal compressibility and flow on color Doppler imaging. Superficial Great Saphenous Vein: No evidence of thrombus. Normal compressibility. Venous Reflux:  None. Other Findings: No evidence of superficial thrombophlebitis or abnormal fluid collection. IMPRESSION: No evidence of left lower extremity deep venous thrombosis. Electronically Signed   By: Aletta Edouard M.D.   On: 04/14/2017 13:56   Dg Chest Port 1 View  Result Date: 04/14/2017 CLINICAL DATA:  Acute onset of cough and shortness of breath. Initial encounter. EXAM: PORTABLE CHEST 1 VIEW COMPARISON:  Chest radiograph performed 12/07/2016 FINDINGS: The lungs are well-aerated. Mild right  basilar airspace opacity raises concern for pneumonia. There is no evidence of pleural effusion or pneumothorax. The cardiomediastinal silhouette is mildly enlarged. A pacemaker is noted overlying the left chest wall, with leads ending overlying the right atrium and right ventricle. No acute osseous abnormalities are seen. IMPRESSION: Mild right basilar airspace opacity raises concern for pneumonia. Mild cardiomegaly. Electronically Signed   By: Garald Balding M.D.   On: 04/14/2017 00:05    EKG:   Orders placed or performed during the hospital encounter of 04/13/17  . ED EKG 12-Lead  . ED EKG 12-Lead  . EKG 12-Lead  . EKG 12-Lead    ASSESSMENT AND PLAN:   Sepsis ([place]) - was septic criteria with fever and hypotension at the time of admission Blood cultures are growing Escherichia coli, also has community-acquired pneumonia and LLE cellulitis Initial IV antibiotics levofloxacin and vancomycin have been changed to meropenem lactic acid was within normal limits, patient is hemodynamically stable    CAP (community acquired pneumonia) - IV antibiotics and nebulizers as needed  Left lower extremity cellulitis- meropenem ruled out DVT with negvenous Dopplers     Atrial fibrillation ([place]) - continue  rate controlling meds and anticoagulation with Xarelto    Essential hypertension -hold antihypertensives as patient blood pressure is soft    Diabetes (HCC) - sliding scale insulin with corresponding glucose checks Patient is started on Lantus 8 units daily for basal coverage    COPD (chronic obstructive pulmonary disease) ([place]) - home dose inhalers   Generalized weakness PT evaluation   All the records are reviewed and case discussed with Care Management/Social Workerr. Management plans  discussed with the patient, daughter at bedside and RN  they are in agreement.  CODE STATUS: fc   TOTAL TIME TAKING CARE OF THIS PATIENT: 35 minutes.   POSSIBLE D/C IN 2-3 DAYS, DEPENDING ON  CLINICAL CONDITION.  Note: This dictation was prepared with Dragon dictation along with smaller phrase technology. Any transcriptional errors that result from this process are unintentional.   Nicholes Mango M.D on 04/15/2017 at 1:26 PM  Between 7am to 6pm - Pager - [phone] After 6pm go to [email or web address] - password [email or web address] [facility]  Office  [phone]  CC: Primary care physician; Tracie Harrier, MD

## 2017-04-15 NOTE — Evaluation (Signed)
Physical Therapy Evaluation Patient Details [name] MRN: [id number] DOB: 02-Jun-1925 Today's Date: 04/15/2017   History of Present Illness  Pt is an 83 y.o. F with PMH including a-fib, skin cancer, CHF, chronic back pain, COPD, diabetes mellitus, and HTN. Presented to ED on 04/13/17 with dyspnea, vomiting, abdominal discomfort, fever and cough secondary to community acquired pneumonia, sepsis secondary to CAP and LLE cellulitis. Doppler of LLE negative for DVT.  Clinical Impression  Prior to hospital admission, pt was independent with ADLs but received assistance for bathing and housekeeping from a personal care attendant 3 hrs in AM, 4 hrs in PM; uses RW for ambulation within the home, 3WW for ambulation within the community.  Pt lives at home with husband.  Currently pt is supervision for bed mobility; mod assist x1 and min guard x1 for transfers; min guard-min assist for gait; generalized strength, balance and mobility deficits with increased time and effort to perform all tasks.  Pt would benefit from skilled PT to address noted impairments and functional limitations (see below for any additional details).  Upon hospital discharge, recommend pt discharge to SNF.     Follow Up Recommendations SNF    Equipment Recommendations  Rolling walker with 5" wheels (Pt owns recommended DME)    Recommendations for Other Services       Precautions / Restrictions Precautions Precautions: Fall Restrictions Weight Bearing Restrictions: No      Mobility  Bed Mobility Overal bed mobility: Needs Assistance Bed Mobility: Supine to Sit     Supine to sit: Supervision;HOB elevated     General bed mobility comments: Supervision for safety; increased time and effort to perform; railing use  Transfers Overall transfer level: Needs assistance Equipment used: Rolling walker (2 wheeled) Transfers: Sit to/from Stand Sit to Stand: Mod assist;+2 safety/equipment;Min guard          General transfer comment: Sit to stand x2 trials with x2 assist (one min guard for safety, one mod assist); increased time and effort to perform; heavy vc's for upright posture, sticking belly out to correct posterior lean; early sit into chair with uncontrolled descent  Ambulation/Gait Ambulation/Gait assistance: Min assist;+2 safety/equipment;Min guard Ambulation Distance (Feet): 4 Feet Assistive device: Rolling walker (2 wheeled)       General Gait Details: decreased gait speed, short steps with minimal foot clearance bilaterally; decreased weight shift to LLE; x2 assist (one min guard for safety, on min assist); vc's for sequencing and encouragement  Stairs            Wheelchair Mobility    Modified Rankin (Stroke Patients Only)       Balance Overall balance assessment: Needs assistance Sitting-balance support: Single extremity supported Sitting balance-Leahy Scale: Poor Sitting balance - Comments: unable to maintain seated balance >78min without requiring LUE support to prevent L lateral lean   Standing balance support: Bilateral upper extremity supported Standing balance-Leahy Scale: Poor Standing balance comment: min-mod assistance required during static and dynamic balance; vc's for correcting excessive trunk flexion in standing                             Pertinent Vitals/Pain Pain Assessment: Faces Faces Pain Scale: Hurts even more Pain Location: LLE with movement Pain Descriptors / Indicators: Discomfort;Grimacing Pain Intervention(s): Limited activity within patient's tolerance;Monitored during session    Home Living Family/patient expects to be discharged to:: Private residence Living Arrangements: Spouse/significant other Available Help at Discharge: Family;Available PRN/intermittently Type  of Home: House Home Access: Stairs to enter;Ramped entrance (ramp at one entrance) Entrance Stairs-Rails: Right Entrance Stairs-Number of Steps: 4 Home  Layout: One level Home Equipment: Walker - 2 wheels;Bedside commode (3WW, lift recliner)      Prior Function Level of Independence: Needs assistance         Comments: Independent with ADLs; RW for ambulation within the home, 3WW for ambulation within the community. Personal care attendent 3 hours in AM, 4 hours in PM for bathing assist, housekeeping. Pt wears CPAP at night.     Hand Dominance        Extremity/Trunk Assessment   Upper Extremity Assessment Upper Extremity Assessment: Generalized weakness (BUE 4/5 all movements)    Lower Extremity Assessment Lower Extremity Assessment: Generalized weakness (RLE 4/5 all movements; LLE at least 3/5, unable to assess fully secondary to pain within LLE with movement)    Cervical / Trunk Assessment Cervical / Trunk Assessment: Normal  Communication   Communication: No difficulties  Cognition Arousal/Alertness: Awake/alert Behavior During Therapy: WFL for tasks assessed/performed Overall Cognitive Status: Within Functional Limits for tasks assessed                                        General Comments General comments (skin integrity, edema, etc.): L shin/calf swollen, red, tender. L ventral foot wound seeping clear exudate and some blood upon inspection, nursing notified and cleansed wound, applied temporary foam dressing and cleared for WB through LLE for session. R hand IV site bleeding during session; nursing notified, cleaned and removed.    Exercises Total Joint Exercises Ankle Circles/Pumps: AROM;Both;10 reps Heel Slides: AROM;Both;10 reps (hand supporting under heel) Other Exercises Other Exercises: L/R weight shifts in standing x5   Assessment/Plan    PT Assessment Patient needs continued PT services  PT Problem List Decreased strength;Decreased range of motion;Decreased activity tolerance;Decreased balance;Decreased mobility;Pain       PT Treatment Interventions DME instruction;Gait  training;Stair training;Functional mobility training;Therapeutic activities;Therapeutic exercise;Balance training;Patient/family education    PT Goals (Current goals can be found in the Care Plan section)  Acute Rehab PT Goals Patient Stated Goal: to go home PT Goal Formulation: With patient Time For Goal Achievement: 04/29/17 Potential to Achieve Goals: Fair    Frequency Min 2X/week   Barriers to discharge        Co-evaluation               AM-PAC PT "6 Clicks" Daily Activity  Outcome Measure Difficulty turning over in bed (including adjusting bedclothes, sheets and blankets)?: A Little Difficulty moving from lying on back to sitting on the side of the bed? : A Lot Difficulty sitting down on and standing up from a chair with arms (e.g., wheelchair, bedside commode, etc,.)?: Unable Help needed moving to and from a bed to chair (including a wheelchair)?: A Lot Help needed walking in hospital room?: A Lot Help needed climbing 3-5 steps with a railing? : Total 6 Click Score: 11    End of Session Equipment Utilized During Treatment: Gait belt;Oxygen Activity Tolerance: Patient tolerated treatment well Patient left: in chair;with call bell/phone within reach;with chair alarm set Nurse Communication: Other (comment) (end of session, nursing able to remove dressing from L foot) PT Visit Diagnosis: Unsteadiness on feet (R26.81);Other abnormalities of gait and mobility (R26.89);Muscle weakness (generalized) (M62.81);Pain Pain - Right/Left: Left Pain - part of body: Leg    Time:  6045-4098 PT Time Calculation (min) (ACUTE ONLY): 56 min   Charges:         PT G Codes:       Wetzel Bjornstad, SPT 04/15/2017, 2:03 PM

## 2017-04-15 NOTE — Clinical Social Work Note (Signed)
CSW spoke with patient and her daughter.  Patient states she would rather go home with home health, however her daughter feels that patient should go to SNF.  Patient did give CSW permission to begin bed search, and she will think about going to SNF.  CSW to begin bed search process in [place].  Formal assessment to follow.  Jones Broom. [place], MSW, [place]  04/15/2017 5:03 PM

## 2017-04-15 NOTE — Care Management Note (Signed)
Case Management Note  Patient Details  [name] MRN: [id number] Date of Birth: 11/29/1924  Subjective/Objective:    Admitted to [facility] with the diagnosis of sepsis. Lives with husband, Vicente Serene [phone]).   Primary care physician is Dr. Ginette Pitman.  [facility] providers for nursing services in the past. [facility] (11/2016) and [facility] in the past. Home oxygen per [facility] 09/03/2015.  Private Caregivers Monday-Sunday.  9:00am-1:00pm and 6:00pm-10:00pm (Off Sunday mornings).               Action/Plan: Will continue to follow for discharge plans   Expected Discharge Date:  04/16/17               Expected Discharge Plan:     In-House Referral:     Discharge planning Services     Post Acute Care Choice:    Choice offered to:     DME Arranged:    DME Agency:     HH Arranged:    HH Agency:     Status of Service:     If discussed at [employer], dates discussed:    Additional Comments:  Shelbie Ammons, RN MSN CCM Care Management [phone] 04/15/2017, 2:06 PM

## 2017-04-16 ENCOUNTER — Inpatient Hospital Stay: Payer: Medicare Other

## 2017-04-16 LAB — HEMOGLOBIN A1C
Hgb A1c MFr Bld: 8.4 % — ABNORMAL HIGH (ref 4.8–5.6)
Mean Plasma Glucose: 194 mg/dL

## 2017-04-16 LAB — CULTURE, BLOOD (ROUTINE X 2)

## 2017-04-16 LAB — CBC
HEMATOCRIT: 31.2 % — AB (ref 35.0–47.0)
HEMOGLOBIN: 10 g/dL — AB (ref 12.0–16.0)
MCH: 29.5 pg (ref 26.0–34.0)
MCHC: 31.9 g/dL — ABNORMAL LOW (ref 32.0–36.0)
MCV: 92.4 fL (ref 80.0–100.0)
Platelets: 94 10*3/uL — ABNORMAL LOW (ref 150–440)
RBC: 3.38 MIL/uL — AB (ref 3.80–5.20)
RDW: 15.4 % — ABNORMAL HIGH (ref 11.5–14.5)
WBC: 8.4 10*3/uL (ref 3.6–11.0)

## 2017-04-16 LAB — GLUCOSE, CAPILLARY
GLUCOSE-CAPILLARY: 216 mg/dL — AB (ref 65–99)
GLUCOSE-CAPILLARY: 195 mg/dL — AB (ref 65–99)
Glucose-Capillary: 190 mg/dL — ABNORMAL HIGH (ref 65–99)
Glucose-Capillary: 213 mg/dL — ABNORMAL HIGH (ref 65–99)

## 2017-04-16 MED ORDER — FUROSEMIDE 20 MG PO TABS
20.0000 mg | ORAL_TABLET | Freq: Once | ORAL | Status: AC
  Administered 2017-04-16: 15:00:00 20 mg via ORAL
  Filled 2017-04-16: qty 1

## 2017-04-16 NOTE — Clinical Social Work Note (Signed)
Clinical Social Work Assessment  Patient Details  [name] MRN: [id number] Date of Birth: 12/03/1924  Date of referral:  04/15/17               Reason for consult:  Facility Placement                Permission sought to share information with:  Family Supports, Customer service manager Permission granted to share information::  Yes, Verbal Permission Granted  Name::     [name],[name] Daughter [phone]  [phone] or [place], [place] [phone]  [phone]   Agency::  SNF admissions  Relationship::     Contact Information:     Housing/Transportation Living arrangements for the past 2 months:  Single Family Home Source of Information:  Patient, Adult Children Patient Interpreter Needed:  None Criminal Activity/Legal Involvement Pertinent to Current Situation/Hospitalization:  No - Comment as needed Significant Relationships:  Adult Children, Spouse Lives with:  Spouse Do you feel safe going back to the place where you live?  No Need for family participation in patient care:  No (Coment)  Care giving concerns:  Patient does not want to go to SNF, but she has decided she will go for short term rehab.   Social Worker assessment / plan:  Patient is an 83 year old married female who is alert and oriented x4.  Patient states she has been to rehab in the past, and she does not really want to go again, but she has agreed to go.  Patient was explained how insurance will pay for stay at SNF and what to expect.  Patient states she has caregivers at home, but they are not there all the time.  Caregivers only come part of the day in the morning and in the evening.  Patient states her husband had some mini strokes recently, and is not able to help her very much currently.  Patient gave CSW permission to begin bed search in [place].  Patient did not have any other questions or concerns.  Employment status:  Retired Office manager PT Recommendations:  [place] / Referral to community resources:  [place]  Patient/Family's Response to care:  Patient and family agreeable to going to SNF.  Patient/Family's Understanding of and Emotional Response to Diagnosis, Current Treatment, and Prognosis:  Patient does not really want to go to SNF, but she has agreed.  Patient hopes she will not have to be there very long.  Emotional Assessment Appearance:  Appears stated age Attitude/Demeanor/Rapport:    Affect (typically observed):  Appropriate, Calm Orientation:  Oriented to Self, Oriented to Place, Oriented to  Time, Oriented to Situation Alcohol / Substance use:  Not Applicable Psych involvement (Current and /or in the community):  No (Comment)  Discharge Needs  Concerns to be addressed:  Lack of Support Readmission within the last 30 days:  No Current discharge risk:  Lack of support system Barriers to Discharge:  Continued Medical Work up   Anell Barr 04/16/2017, 4:31 PM

## 2017-04-16 NOTE — Clinical Social Work Note (Signed)
CSW presented bed offers to patient and her family and she chose [facility] home SNF.  CSW contacted [facility] and they can accept patient once she is medically ready for discharge and orders have been received.  Jones Broom. [place], MSW, [place]  04/16/2017 4:28 PM

## 2017-04-16 NOTE — NC FL2 (Signed)
[place] LEVEL OF CARE SCREENING TOOL     IDENTIFICATION  Patient [name] Birthdate: Oct 26, 1924 Sex: female Admission Date (Current Location): 04/13/2017  [place] and [place] Number:  Engineering geologist and Address:  [facility], [address] [place], [place] [address]      Provider Number: [id number]  Attending Physician Name and Address:  Nicholes Mango, MD  Relative Name and Phone Number:  [name],[name] Daughter [phone]  [phone] or [place], [place] [phone]  [phone]     Current Level of Care: Hospital Recommended Level of Care: [place] Prior Approval Number:    Date Approved/Denied:   PASRR Number: [id number]  Discharge Plan: SNF    Current Diagnoses: Patient Active Problem List   Diagnosis Date Noted  . Sepsis ([place]) 04/14/2017  . CAP (community acquired pneumonia) 04/14/2017  . COPD (chronic obstructive pulmonary disease) ([place]) 12/07/2016  . UTI (urinary tract infection) 12/07/2016  . Ventral hernia without obstruction or gangrene   . Pneumonia 09/07/2015  . Hyponatremia 09/07/2015  . Acute respiratory failure ([place]) 09/01/2015  . Atrial fibrillation ([place]) 04/10/2015  . Essential hypertension 04/10/2015  . Diabetes ([place]) 04/10/2015  . Back pain 04/10/2015  . Acute on chronic combined systolic and diastolic CHF (congestive heart failure) ([place]) 03/21/2015  . Pulmonary nodule 11/10/2014    Orientation RESPIRATION BLADDER Height & Weight     Self, Time, Situation, Place  O2 (3L) Continent Weight: 174 lb (78.9 kg) Height:  5\' 7"  (170.2 cm)  BEHAVIORAL SYMPTOMS/MOOD NEUROLOGICAL BOWEL NUTRITION STATUS      Continent Diet (Carb Modified)  AMBULATORY STATUS COMMUNICATION OF NEEDS Skin   Limited Assist Verbally Surgical wounds                       Personal Care Assistance Level of Assistance  Bathing, Feeding, Dressing Bathing Assistance: Limited  assistance Feeding assistance: Independent Dressing Assistance: Limited assistance     Functional Limitations Info  Sight, Hearing, Speech Sight Info: Adequate Hearing Info: Adequate Speech Info: Adequate    SPECIAL CARE FACTORS FREQUENCY  PT (By licensed PT)     PT Frequency: 5x a week              Contractures Contractures Info: Not present    Additional Factors Info  Code Status, Allergies, Insulin Sliding Scale Code Status Info: Full Code Allergies Info: CEFTRIAXONE, CEFUROXIME, CEPHALOSPORINS, CODEINE, EPINEPHRINE, MORPHINE AND RELATED, BUPRENORPHINE HCL, CIPROFLOXACIN, LATEX, LIDOCAINE, PROCAINE   Insulin Sliding Scale Info: insulin aspart (novoLOG) injection 0-9 Units 3x a day with meals       Current Medications (04/16/2017):  This is the current hospital active medication list Current Facility-Administered Medications  Medication Dose Route Frequency Provider Last Rate Last Dose  . acetaminophen (TYLENOL) tablet 650 mg  650 mg Oral Q6H PRN Lance Coon, MD   650 mg at 04/14/17 0820   Or  . acetaminophen (TYLENOL) suppository 650 mg  650 mg Rectal Q6H PRN Lance Coon, MD      . carvedilol (COREG) tablet 6.25 mg  6.25 mg Oral BID WC Lance Coon, MD   6.25 mg at 04/16/17 0831  . escitalopram (LEXAPRO) tablet 10 mg  10 mg Oral Daily Lance Coon, MD   10 mg at 04/16/17 0830  . insulin aspart (novoLOG) injection 0-5 Units  0-5 Units Subcutaneous QHS Lance Coon, MD      . insulin aspart (novoLOG) injection 0-9 Units  0-9 Units Subcutaneous  TID WC Lance Coon, MD   2 Units at 04/16/17 [phone]  . insulin glargine (LANTUS) injection 8 Units  8 Units Subcutaneous Daily Nicholes Mango, MD   8 Units at 04/16/17 [phone]  . meropenem (MERREM) 1 g in sodium chloride 0.9 % 100 mL IVPB  1 g Intravenous Q12H Nicholes Mango, MD   Stopped at 04/16/17 0333  . mometasone-formoterol (DULERA) 200-5 MCG/ACT inhaler 2 puff  2 puff Inhalation BID Lance Coon, MD   2 puff at 04/16/17  [phone]  . ondansetron (ZOFRAN) tablet 4 mg  4 mg Oral Q6H PRN Lance Coon, MD       Or  . ondansetron [facility]) injection 4 mg  4 mg Intravenous Q6H PRN Lance Coon, MD      . oxyCODONE (Oxy IR/ROXICODONE) immediate release tablet 5-10 mg  5-10 mg Oral Q6H PRN Nicholes Mango, MD   5 mg at 04/15/17 1926  . pantoprazole (PROTONIX) EC tablet 40 mg  40 mg Oral Daily Lance Coon, MD   40 mg at 04/16/17 0831  . Rivaroxaban (XARELTO) tablet 15 mg  15 mg Oral Q supper Coffee, Donna Christen, RPH   15 mg at 04/15/17 1750  . spironolactone (ALDACTONE) tablet 25 mg  25 mg Oral Daily Lance Coon, MD   25 mg at 04/16/17 0831     Discharge Medications: Please see discharge summary for a list of discharge medications.  Relevant Imaging Results:  Relevant Lab Results:   Additional Information SSN [id number]  [name], [place]

## 2017-04-16 NOTE — Progress Notes (Signed)
SATURATION QUALIFICATIONS: (This note is used to comply with regulatory documentation for home oxygen)  Patient Saturations on Room Air at Rest = 88%  Patient Saturations on Room Air while Ambulating = 86%  Patient Saturations on 3 Liters of oxygen while Ambulating = 92%  Please briefly explain why patient needs home oxygen: COPD

## 2017-04-16 NOTE — Progress Notes (Signed)
[place] at [place] [name]    MR#:  [id number]  DATE OF BIRTH:  [birth year]  SUBJECTIVE:  CHIEF COMPLAINT:  Patient is not feeling good today.   REVIEW OF SYSTEMS:  CONSTITUTIONAL: No fever, fatigue or weakness.  EYES: No blurred or double vision.  EARS, NOSE, AND THROAT: No tinnitus or ear pain.  RESPIRATORY: No cough, shortness of breath, wheezing or hemoptysis.  CARDIOVASCULAR: No chest pain, orthopnea, edema.  GASTROINTESTINAL: No nausea, vomiting, diarrhea or abdominal pain.  GENITOURINARY: No dysuria, hematuria.  ENDOCRINE: No polyuria, nocturia,  HEMATOLOGY: No anemia, easy bruising or bleeding SKIN: No rash or lesion. MUSCULOSKELETAL: Left leg is swollen and warm to touch  Neurology denies any weakness  PSYCHIATRY: No anxiety or depression.   DRUG ALLERGIES:   Allergies  Allergen Reactions  . Ceftriaxone Swelling  . Cefuroxime Hives  . Cephalosporins Swelling and Other (See Comments)    Reaction:  Fainting/dry mouth   . Codeine Nausea And Vomiting  . Epinephrine Other (See Comments)    Reaction:  Fainting   . Morphine And Related Other (See Comments)    Reaction:  GI upset   . Buprenorphine Hcl Nausea And Vomiting  . Ciprofloxacin Itching, Swelling and Rash  . Latex Rash  . Lidocaine Rash  . Procaine Rash    VITALS:  Blood pressure (!) 134/47, pulse 72, temperature 98.2 F (36.8 C), temperature source Oral, resp. rate 20, height 5\' 7"  (1.702 m), weight 78.9 kg (174 lb), SpO2 100 %.  PHYSICAL EXAMINATION:  GENERAL:  83 y.o.-year-old patient lying in the bed with no acute distress.  EYES: Pupils equal, round, reactive to light and accommodation. No scleral icterus. Extraocular muscles intact.  HEENT: Head atraumatic, normocephalic. Oropharynx and nasopharynx clear.  NECK:  Supple, no jugular venous distention. No thyroid enlargement, no tenderness.  LUNGS: Normal breath sounds bilaterally, no  wheezing, rales,rhonchi or crepitation. No use of accessory muscles of respiration.  CARDIOVASCULAR: S1, S2 normal. No murmurs, rubs, or gallops.  ABDOMEN: Soft, nontender, nondistended. Bowel sounds present. No organomegaly or mass.  EXTREMITIES:  left lower extremity with chronic venous changes but edematous and warm to touch No pedal edema, cyanosis, or clubbing.  NEUROLOGIC: Cranial nerves II through XII are intact. Muscle strength at her base line Sensation intact. Gait not checked.  PSYCHIATRIC: The patient is alert and oriented x 3.  SKIN: No obvious rash, lesion, or ulcer.    LABORATORY PANEL:   CBC  Recent Labs Lab 04/16/17 0608  WBC 8.4  HGB 10.0*  HCT 31.2*  PLT 94*   ------------------------------------------------------------------------------------------------------------------  Chemistries   Recent Labs Lab 04/13/17 2356  04/15/17 0404  NA 136  < > 136  K 5.0  < > 4.7  CL 102  < > 105  CO2 26  < > 27  GLUCOSE 360*  < > 134*  BUN 47*  < > 37*  CREATININE 1.15*  < > 1.11*  CALCIUM 8.8*  < > 8.3*  AST 29  --   --   ALT 23  --   --   ALKPHOS 55  --   --   BILITOT 1.1  --   --   < > = values in this interval not displayed. ------------------------------------------------------------------------------------------------------------------  Cardiac Enzymes No results for input(s): TROPONINI in the last 168 hours. ------------------------------------------------------------------------------------------------------------------  RADIOLOGY:  Dg Chest 2 View  Result Date: 04/16/2017 CLINICAL DATA:  Shortness of Breath EXAM: CHEST  2 VIEW COMPARISON:  04/13/2017 FINDINGS: Cardiac shadow is enlarged. Pacing device is again seen. Postsurgical changes are noted. The lungs are well aerated bilaterally with some slight increase in the degree of central vascular congestion. No focal confluent infiltrate is seen. Mild right basilar atelectasis remains. No acute bony  abnormality is seen. IMPRESSION: Stable atelectatic changes in the right base. Mild vascular congestion is now seen. Electronically Signed   By: Inez Catalina M.D.   On: 04/16/2017 14:07    EKG:   Orders placed or performed during the hospital encounter of 04/13/17  . ED EKG 12-Lead  . ED EKG 12-Lead  . EKG 12-Lead  . EKG 12-Lead    ASSESSMENT AND PLAN:   Sepsis ([place]) - was septic criteria with fever and hypotension at the time of admission Blood cultures are growing Escherichia coli, also has community-acquired pneumonia and LLE cellulitis Initial IV antibiotics levofloxacin and vancomycin have been changed to meropenem, continue the same recommended lactic acid was within normal limits, patient is hemodynamically stable    CAP (community acquired pneumonia) - IV antibiotics and nebulizers as needed  Left lower extremity cellulitis- meropenem ruled out DVT with negvenous Dopplers     Atrial fibrillation ([place]) - continue  rate controlling meds and anticoagulation with Xarelto    Essential hypertension -hold antihypertensives as patient blood pressure is soft    Diabetes (HCC) - sliding scale insulin with corresponding glucose checks Patient is started on Lantus 8 units daily for basal coverage    COPD (chronic obstructive pulmonary disease) ([place]) - home dose inhalers   Generalized weakness PT evaluation-Recommending skilled nursing facility family and patient are agreeable   All the records are reviewed and case discussed with Care Management/Social Workerr. Management plans discussed with the patient, daughter at bedside and RN  they are in agreement.  CODE STATUS: fc   TOTAL TIME TAKING CARE OF THIS PATIENT: 35 minutes.   POSSIBLE D/C IN 1-2  DAYS, DEPENDING ON CLINICAL CONDITION.  Note: This dictation was prepared with Dragon dictation along with smaller phrase technology. Any transcriptional errors that result from this process are unintentional.   Nicholes Mango  M.D on 04/16/2017 at 2:59 PM  Between 7am to 6pm - Pager - [phone] After 6pm go to [email or web address] - password [email or web address] [facility]  Office  [phone]  CC: Primary care physician; Tracie Harrier, MD

## 2017-04-17 LAB — CULTURE, BLOOD (ROUTINE X 2)

## 2017-04-17 LAB — GLUCOSE, CAPILLARY
GLUCOSE-CAPILLARY: 163 mg/dL — AB (ref 65–99)
GLUCOSE-CAPILLARY: 231 mg/dL — AB (ref 65–99)
GLUCOSE-CAPILLARY: 175 mg/dL — AB (ref 65–99)
Glucose-Capillary: 187 mg/dL — ABNORMAL HIGH (ref 65–99)

## 2017-04-17 MED ORDER — SULFAMETHOXAZOLE-TRIMETHOPRIM 800-160 MG PO TABS
1.0000 | ORAL_TABLET | Freq: Two times a day (BID) | ORAL | Status: DC
  Administered 2017-04-17 – 2017-04-18 (×3): 1 via ORAL
  Filled 2017-04-17 (×4): qty 1

## 2017-04-17 MED ORDER — CIPROFLOXACIN HCL 500 MG PO TABS
500.0000 mg | ORAL_TABLET | Freq: Two times a day (BID) | ORAL | Status: DC
  Administered 2017-04-18: 500 mg via ORAL
  Filled 2017-04-17: qty 1

## 2017-04-17 MED ORDER — CIPROFLOXACIN HCL 500 MG PO TABS
500.0000 mg | ORAL_TABLET | Freq: Once | ORAL | Status: AC
  Administered 2017-04-17: 17:00:00 500 mg via ORAL
  Filled 2017-04-17: qty 1

## 2017-04-17 NOTE — Progress Notes (Signed)
Inpatient Diabetes Program Recommendations  AACE/ADA: New Consensus Statement on Inpatient Glycemic Control (2015)  Target Ranges:  Prepandial:   less than 140 mg/dL      Peak postprandial:   less than 180 mg/dL (1-2 hours)      Critically ill patients:  140 - 180 mg/dL   Lab Results  Component Value Date   GLUCAP 231 (H) 04/17/2017   HGBA1C 8.4 (H) 04/15/2017    Review of Glycemic Control Results for [name], [name] (MRN [id number]) as of 04/17/2017 13:03  Ref. Range 04/16/2017 11:40 04/16/2017 16:29 04/16/2017 20:52 04/17/2017 07:25 04/17/2017 11:27  Glucose-Capillary Latest Ref Range: 65 - 99 mg/dL 216 (H) 195 (H) 213 (H) 163 (H) 231 (H)   Diabetes history: DM2 Outpatient Diabetes medications: Amaryl 4 mg BID, Januvia 100 mg daily, Novolog 5 units with lunch if CBG over 200 mg/dl Current orders for Inpatient glycemic control: Lantus 8 units + Novolog 0-9 units TID with meals, Novolog 0-5 units QHS  Inpatient Diabetes Program Recommendations:  If postprandial CBGs elevated, please consider: Novolog 2 units tid meal coverage if eats 50%  Thank you, Bethena Roys E. Margert Edsall, RN, MSN, CDE  Diabetes Coordinator Inpatient Glycemic Control Team Team Pager [phone] (8am-5pm) 04/17/2017 1:04 PM

## 2017-04-17 NOTE — Progress Notes (Signed)
Physical Therapy Treatment Patient Details [name] MRN: [id number] DOB: 25-Jan-1925 Today's Date: 04/17/2017    History of Present Illness Pt is an 83 y.o. F with PMH including a-fib, skin cancer, CHF, chronic back pain, COPD, diabetes mellitus, and HTN. Presented to ED on 04/13/17 with dyspnea, vomiting, abdominal discomfort, fever and cough secondary to community acquired pneumonia, sepsis secondary to CAP and LLE cellulitis. Doppler of LLE negative for DVT.    PT Comments    Pt requires assist to stand upright with RW and shift weight forward when standing from bed (d/t posterior lean).  Pt initially taking a few steps with min assist using RW but with distance pt's B knees flexing more and more and pt with increased difficulty taking steps upon returning to chair (requiring max assist to finish ambulation and sit safely into recliner).  Pt appearing in improved mood end of session (pt verbalizing concerns regarding her strength/mobility status beginning of session).  Will continue to focus on strengthening and progressive functional mobility per pt tolerance.   Follow Up Recommendations  SNF     Equipment Recommendations  Rolling walker with 5" wheels (Pt owns recommended DME)    Recommendations for Other Services       Precautions / Restrictions Precautions Precautions: Fall Restrictions Weight Bearing Restrictions: No    Mobility  Bed Mobility Overal bed mobility: Needs Assistance Bed Mobility: Supine to Sit     Supine to sit: Supervision;HOB elevated     General bed mobility comments: increased effort and time to perform; use of bed rail  Transfers Overall transfer level: Needs assistance Equipment used: Rolling walker (2 wheeled) Transfers: Sit to/from Stand Sit to Stand: Min assist;Mod assist  Stand step turn bed to recliner min assist and use of RW       General transfer comment: assist to initiate stand from bed and come to full upright  posture (min to mod assist); vc's and assist to shift weight forward (d/t posterior lean in flexed posture); min assist to stand from recliner  Ambulation/Gait Ambulation/Gait assistance: Min assist;Max assist Ambulation Distance (Feet): 10 Feet Assistive device: Rolling walker (2 wheeled)   Gait velocity: decreased   General Gait Details: pt initially taking a few steps with min assist but with distance pt's B knees flexing more and more and pt with increased difficulty taking steps upon returning to chair (requiring max assist to finish ambulation and sit safely into recliner)   Stairs            Wheelchair Mobility    Modified Rankin (Stroke Patients Only)       Balance Overall balance assessment: Needs assistance Sitting-balance support: No upper extremity supported;Feet supported Sitting balance-Leahy Scale: Good Sitting balance - Comments: sitting reaching within BOS   Standing balance support: Bilateral upper extremity supported Standing balance-Leahy Scale: Poor Standing balance comment: posterior lean in standing with RW requiring assist and vc's to correct posture and shift weight forward                            Cognition Arousal/Alertness: Awake/alert Behavior During Therapy: WFL for tasks assessed/performed Overall Cognitive Status: Within Functional Limits for tasks assessed                                        Exercises      General  Comments General comments (skin integrity, edema, etc.): Minimal pink/red drainage noted L ventral foot (through the 2 socks pt was wearing in bed) prior to OOB mobility; nursing notified and came to clean wound.  Nursing cleared pt for participation in physical therapy.  Pt agreeable to PT session.  Pt's husband present during session.      Pertinent Vitals/Pain Pain Assessment: 0-10 Pain Score: 4  Pain Location: chronic low back and acute L LE pain Pain Descriptors / Indicators:  Sore;Aching;Tender Pain Intervention(s): Limited activity within patient's tolerance;Monitored during session;Repositioned    Home Living                      Prior Function            PT Goals (current goals can now be found in the care plan section) Acute Rehab PT Goals Patient Stated Goal: to go home PT Goal Formulation: With patient Time For Goal Achievement: 04/29/17 Potential to Achieve Goals: Fair Progress towards PT goals: Progressing toward goals    Frequency    Min 2X/week      PT Plan Current plan remains appropriate    Co-evaluation              AM-PAC PT "6 Clicks" Daily Activity  Outcome Measure  Difficulty turning over in bed (including adjusting bedclothes, sheets and blankets)?: A Little Difficulty moving from lying on back to sitting on the side of the bed? : A Lot Difficulty sitting down on and standing up from a chair with arms (e.g., wheelchair, bedside commode, etc,.)?: Unable Help needed moving to and from a bed to chair (including a wheelchair)?: A Lot Help needed walking in hospital room?: A Lot Help needed climbing 3-5 steps with a railing? : Total 6 Click Score: 11    End of Session Equipment Utilized During Treatment: Gait belt;Oxygen Activity Tolerance: Patient limited by fatigue Patient left: in chair;with call bell/phone within reach;with chair alarm set;with family/visitor present Nurse Communication: Mobility status;Precautions PT Visit Diagnosis: Unsteadiness on feet (R26.81);Other abnormalities of gait and mobility (R26.89);Muscle weakness (generalized) (M62.81);Pain Pain - Right/Left: Left Pain - part of body: Leg     Time: 7342-8768 PT Time Calculation (min) (ACUTE ONLY): 38 min  Charges:  $Gait Training: 8-22 mins $Therapeutic Activity: 23-37 mins                    G CodesLeitha Bleak, PT 04/17/17, 5:17 PM [phone]

## 2017-04-17 NOTE — Progress Notes (Signed)
Pharmacist/MD Communicaion  Pharmacy Resident spoke with MD Gouru. MD ok with Cipro since patient received Levofloxacin during this admission. RN informed to watch patient closely for any S/S of allergic rxn and contact MD immediately if allergic reaction occurred.   Larene Beach, PharmD

## 2017-04-17 NOTE — Progress Notes (Signed)
Medications administered by student RN [id number] with supervision of Clinical Instructor Paul Torpey MSN, RN-BC or patient's assigned RN.   

## 2017-04-17 NOTE — Progress Notes (Signed)
ANTICOAGULATION CONSULT NOTE - Initial Consult  Pharmacy Consult for xarelto Indication: atrial fibrillation  Allergies  Allergen Reactions  . Ceftriaxone Swelling  . Cefuroxime Hives  . Cephalosporins Swelling and Other (See Comments)    Reaction:  Fainting/dry mouth   . Codeine Nausea And Vomiting  . Epinephrine Other (See Comments)    Reaction:  Fainting   . Morphine And Related Other (See Comments)    Reaction:  GI upset   . Buprenorphine Hcl Nausea And Vomiting  . Ciprofloxacin Itching, Swelling and Rash  . Latex Rash  . Lidocaine Rash  . Procaine Rash    Patient Measurements: Height: 5\' 7"  (170.2 cm) Weight: 174 lb (78.9 kg) IBW/kg (Calculated) : 61.6  Serum creatinine: 1.11 mg/dL (H) 04/15/17 0404 Estimated creatinine clearance: 37.2 mL/min (A) CrCl on actual body weight: 36.60ml/min  Vital Signs: Temp: 98.1 F (36.7 C) (10/25 1352) Temp Source: Oral (10/25 1352) BP: 129/46 (10/25 1352) Pulse Rate: 74 (10/25 1352)  Labs:  Recent Labs  04/15/17 0404 04/16/17 0608  HGB 10.7* 10.0*  HCT 32.8* 31.2*  PLT 86* 94*  CREATININE 1.11*  --     Estimated Creatinine Clearance: 37.2 mL/min (A) (by C-G formula based on SCr of 1.11 mg/dL (H)).   Medical History: Past Medical History:  Diagnosis Date  . Cancer ([place])    skin ca  . CHF (congestive heart failure) ([place])   . Chronic back pain   . COPD (chronic obstructive pulmonary disease) ([place])   . Diabetes mellitus without complication ([place])   . Hypertension   . Poor perfusion of leg     Medications:  Prescriptions Prior to Admission  Medication Sig Dispense Refill Last Dose  . aspirin EC 81 MG tablet Take 81 mg by mouth daily.   04/13/2017 at Unknown time  . baclofen (LIORESAL) 10 MG tablet Take 10 mg by mouth 3 (three) times daily.   Past Week at Unknown time  . carvedilol (COREG) 6.25 MG tablet Take 6.25 mg by mouth 2 (two) times daily with a meal.    Past Week at Unknown time  . cholecalciferol  (VITAMIN D) 1000 units tablet Take 1,000 Units by mouth daily.   Past Week at Unknown time  . docusate sodium (COLACE) 100 MG capsule Take 100 mg by mouth 2 (two) times daily.    Past Week at Unknown time  . escitalopram (LEXAPRO) 10 MG tablet Take 10 mg by mouth daily.   Past Week at Unknown time  . fentaNYL (DURAGESIC - DOSED MCG/HR) 25 MCG/HR patch Place 25 mcg onto the skin every 3 (three) days.    Past Week at Unknown time  . ferrous sulfate 325 (65 FE) MG tablet Take 325 mg by mouth daily with breakfast.   Past Week at Unknown time  . furosemide (LASIX) 40 MG tablet Take 1 tablet (40 mg total) by mouth daily. Pt alternates with the 80mg  tablet. (Patient taking differently: Take 40-80 mg by mouth daily. Pt alternates with the 80mg  tablet. ) 30 tablet 0 Past Week at Unknown time  . gabapentin (NEURONTIN) 100 MG capsule Take 100-200 mg by mouth at bedtime.   Past Week at Unknown time  . glimepiride (AMARYL) 4 MG tablet Take 4 mg by mouth 2 (two) times daily after a meal.   Past Week at Unknown time  . magnesium oxide (MAG-OX) 400 MG tablet Take 400 mg by mouth daily.   Past Week at Unknown time  . Melatonin 1 MG TABS Take  1 mg by mouth at bedtime.   Past Week at Unknown time  . Multiple Vitamin (MULTIVITAMIN) tablet Take 1 tablet by mouth daily.   Past Week at Unknown time  . oxyCODONE (OXY IR/ROXICODONE) 5 MG immediate release tablet Take 5 mg by mouth 2 (two) times daily as needed for severe pain.   prn at prn  . pantoprazole (PROTONIX) 40 MG tablet Take 40 mg by mouth daily.   Past Week at Unknown time  . potassium chloride (KLOR-CON) 20 MEQ packet Take by mouth 2 (two) times daily.   Past Week at Unknown time  . Rivaroxaban (XARELTO) 15 MG TABS tablet Take 15 mg by mouth daily.   Past Week at Unknown time  . rOPINIRole (REQUIP) 0.5 MG tablet Take 0.5 mg by mouth 2 (two) times daily.   Past Week at Unknown time  . sitaGLIPtin (JANUVIA) 100 MG tablet Take 100 mg by mouth daily.   Past Week at  Unknown time  . spironolactone (ALDACTONE) 25 MG tablet Take 25 mg by mouth daily.    Past Week at Unknown time  . vitamin B-12 (CYANOCOBALAMIN) 1000 MCG tablet Take 1,000 mcg by mouth daily.   Past Week at Unknown time  . vitamin C (ASCORBIC ACID) 500 MG tablet Take 500 mg by mouth daily.   Past Week at Unknown time  . Vitamin D, Ergocalciferol, (DRISDOL) 50000 UNITS CAPS capsule Take 50,000 Units by mouth every 7 (seven) days. Pt takes on Thursday.   Past Week at Unknown time  . albuterol (PROVENTIL HFA;VENTOLIN HFA) 108 (90 Base) MCG/ACT inhaler Inhale 1-2 puffs into the lungs every 6 (six) hours as needed for wheezing or shortness of breath. (Patient not taking: Reported on 04/14/2017) 1 Inhaler 0 Not Taking at Unknown time  . budesonide-formoterol (SYMBICORT) 160-4.5 MCG/ACT inhaler Inhale 2 puffs into the lungs 2 (two) times daily. (Patient not taking: Reported on 04/14/2017) 1 Inhaler 12 Not Taking  . insulin aspart (NOVOLOG FLEXPEN) 100 UNIT/ML FlexPen Inject 5 Units into the skin daily before lunch. IF blood sugar is greater than 200 (Patient not taking: Reported on 04/14/2017) 15 mL 11 Not Taking at Unknown time   Scheduled:  . carvedilol  6.25 mg Oral BID WC  . escitalopram  10 mg Oral Daily  . insulin aspart  0-5 Units Subcutaneous QHS  . insulin aspart  0-9 Units Subcutaneous TID WC  . insulin glargine  8 Units Subcutaneous Daily  . mometasone-formoterol  2 puff Inhalation BID  . pantoprazole  40 mg Oral Daily  . rivaroxaban  15 mg Oral Q supper  . spironolactone  25 mg Oral Daily   Infusions:  . meropenem (MERREM) IV Stopped (04/17/17 0329)   PRN: acetaminophen **OR** acetaminophen, ondansetron **OR** ondansetron (ZOFRAN) IV, oxyCODONE Anti-infectives    Start     Dose/Rate Route Frequency Ordered Stop   04/15/17 0500  levofloxacin (LEVAQUIN) IVPB 750 mg  Status:  Discontinued     750 mg 100 mL/hr over 90 Minutes Intravenous Every 48 hours 04/14/17 0844 04/14/17 1424    04/14/17 1800  levofloxacin (LEVAQUIN) IVPB 500 mg  Status:  Discontinued     500 mg 100 mL/hr over 60 Minutes Intravenous Every 24 hours 04/14/17 0427 04/14/17 0844   04/14/17 1430  meropenem (MERREM) 1 g in sodium chloride 0.9 % 100 mL IVPB     1 g 200 mL/hr over 30 Minutes Intravenous Every 12 hours 04/14/17 1421     04/14/17 0600  vancomycin (VANCOCIN)  IVPB 1000 mg/200 mL premix  Status:  Discontinued     1,000 mg 200 mL/hr over 60 Minutes Intravenous Every 24 hours 04/14/17 0443 04/14/17 1424   04/14/17 0000  vancomycin (VANCOCIN) IVPB 1000 mg/200 mL premix     1,000 mg 200 mL/hr over 60 Minutes Intravenous  Once 04/13/17 2350 04/14/17 0315   04/14/17 0000  levofloxacin (LEVAQUIN) IVPB 500 mg     500 mg 100 mL/hr over 60 Minutes Intravenous  Once 04/13/17 2350 04/14/17 0315      Assessment: 83 year old woman with afib, pharmacy consulted for xarelto dosing in the presence of afib.    Goal of Therapy:  anticoagulation on xarelto Monitor platelets by anticoagulation protocol: Yes   Plan:  Xarelto 15mg  po once each evening with supper based on creatinine clearance between 15 and 76ml/min. Will monitor CBC for signs of bleeding and Scr for renal fx.   [place] Resident

## 2017-04-17 NOTE — Progress Notes (Signed)
[facility] [place] [name]    MR#:  [id number]  DATE OF BIRTH:  Sep 19, 1924  SUBJECTIVE:  CHIEF COMPLAINT:  Patient is okay. Wants to get out of the bed and ambulate  REVIEW OF SYSTEMS:  CONSTITUTIONAL: No fever, fatigue or weakness.  EYES: No blurred or double vision.  EARS, NOSE, AND THROAT: No tinnitus or ear pain.  RESPIRATORY: No cough, shortness of breath, wheezing or hemoptysis.  CARDIOVASCULAR: No chest pain, orthopnea, edema.  GASTROINTESTINAL: No nausea, vomiting, diarrhea or abdominal pain.  GENITOURINARY: No dysuria, hematuria.  ENDOCRINE: No polyuria, nocturia,  HEMATOLOGY: No anemia, easy bruising or bleeding SKIN: No rash or lesion. MUSCULOSKELETAL: Left leg is swollen and warm to touch  Neurology denies any weakness  PSYCHIATRY: No anxiety or depression.   DRUG ALLERGIES:   Allergies  Allergen Reactions  . Ceftriaxone Swelling  . Cefuroxime Hives  . Cephalosporins Swelling and Other (See Comments)    Reaction:  Fainting/dry mouth   . Codeine Nausea And Vomiting  . Epinephrine Other (See Comments)    Reaction:  Fainting   . Morphine And Related Other (See Comments)    Reaction:  GI upset   . Buprenorphine Hcl Nausea And Vomiting  . Ciprofloxacin Itching, Swelling and Rash  . Latex Rash  . Lidocaine Rash  . Procaine Rash    VITALS:  Blood pressure (!) 129/46, pulse 74, temperature 98.1 F (36.7 C), temperature source Oral, resp. rate 20, height 5\' 7"  (1.702 m), weight 78.9 kg (174 lb), SpO2 100 %.  PHYSICAL EXAMINATION:  GENERAL:  83 y.o.-year-old patient lying in the bed with no acute distress.  EYES: Pupils equal, round, reactive to light and accommodation. No scleral icterus. Extraocular muscles intact.  HEENT: Head atraumatic, normocephalic. Oropharynx and nasopharynx clear.  NECK:  Supple, no jugular venous distention. No thyroid enlargement, no tenderness.  LUNGS: Normal breath  sounds bilaterally, no wheezing, rales,rhonchi or crepitation. No use of accessory muscles of respiration.  CARDIOVASCULAR: S1, S2 normal. No murmurs, rubs, or gallops.  ABDOMEN: Soft, nontender, nondistended. Bowel sounds present. No organomegaly or mass.  EXTREMITIES:  left lower extremity with chronic venous changes but edematous and warm to touch No pedal edema, cyanosis, or clubbing.  NEUROLOGIC: Cranial nerves II through XII are intact. Muscle strength at her base line Sensation intact. Gait not checked.  PSYCHIATRIC: The patient is alert and oriented x 3.  SKIN: No obvious rash, lesion, or ulcer.    LABORATORY PANEL:   CBC  Recent Labs Lab 04/16/17 0608  WBC 8.4  HGB 10.0*  HCT 31.2*  PLT 94*   ------------------------------------------------------------------------------------------------------------------  Chemistries   Recent Labs Lab 04/13/17 2356  04/15/17 0404  NA 136  < > 136  K 5.0  < > 4.7  CL 102  < > 105  CO2 26  < > 27  GLUCOSE 360*  < > 134*  BUN 47*  < > 37*  CREATININE 1.15*  < > 1.11*  CALCIUM 8.8*  < > 8.3*  AST 29  --   --   ALT 23  --   --   ALKPHOS 55  --   --   BILITOT 1.1  --   --   < > = values in this interval not displayed. ------------------------------------------------------------------------------------------------------------------  Cardiac Enzymes No results for input(s): TROPONINI in the last 168 hours. ------------------------------------------------------------------------------------------------------------------  RADIOLOGY:  Dg Chest 2 View  Result Date: 04/16/2017 CLINICAL DATA:  Shortness of Breath EXAM: CHEST  2 VIEW COMPARISON:  04/13/2017 FINDINGS: Cardiac shadow is enlarged. Pacing device is again seen. Postsurgical changes are noted. The lungs are well aerated bilaterally with some slight increase in the degree of central vascular congestion. No focal confluent infiltrate is seen. Mild right basilar atelectasis  remains. No acute bony abnormality is seen. IMPRESSION: Stable atelectatic changes in the right base. Mild vascular congestion is now seen. Electronically Signed   By: Inez Catalina M.D.   On: 04/16/2017 14:07    EKG:   Orders placed or performed during the hospital encounter of 04/13/17  . ED EKG 12-Lead  . ED EKG 12-Lead  . EKG 12-Lead  . EKG 12-Lead    ASSESSMENT AND PLAN:   Sepsis ([place]) - was septic criteria with fever and hypotension at the time of admission Blood cultures are growing Escherichia coli, sensitive to ciprofloxacin also has community-acquired pneumonia and LLE cellulitis Initial IV antibiotics levofloxacin and vancomycin have been changed to meropenem, discontinue meropenem and change to by mouth ciprofloxacin  lactic acid was within normal limits, patient is hemodynamically stable    CAP (community acquired pneumonia) - IV antibiotics and nebulizers as needed  Left lower extremity cellulitis- meropenem changed to by mouth Bactrim ruled out DVT with negvenous Dopplers     Atrial fibrillation ([place]) - continue  rate controlling meds and anticoagulation with Xarelto    Essential hypertension -hold antihypertensives as patient blood pressure is soft    Diabetes ([facility]) - sliding scale insulin with corresponding glucose checks Patient is started on Lantus 8 units daily for basal coverage    COPD (chronic obstructive pulmonary disease) ([place]) - home dose inhalers   Generalized weakness PT evaluation-Recommending skilled nursing facility family and patient are agreeable   All the records are reviewed and case discussed with Care Management/Social Workerr. Management plans discussed with the patient, daughter at bedside and RN  they are in agreement.  CODE STATUS: fc   TOTAL TIME TAKING CARE OF THIS PATIENT: 35 minutes.   POSSIBLE D/C IN 1-2  DAYS, DEPENDING ON CLINICAL CONDITION.  Note: This dictation was prepared with Dragon dictation along with smaller  phrase technology. Any transcriptional errors that result from this process are unintentional.   Nicholes Mango M.D on 04/17/2017 at 4:46 PM  Between 7am to 6pm - Pager - [phone] After 6pm go to [email or web address] - password [email or web address] [facility]  Office  [phone]  CC: Primary care physician; Tracie Harrier, MD

## 2017-04-18 LAB — GLUCOSE, CAPILLARY
GLUCOSE-CAPILLARY: 185 mg/dL — AB (ref 65–99)
Glucose-Capillary: 135 mg/dL — ABNORMAL HIGH (ref 65–99)
Glucose-Capillary: 203 mg/dL — ABNORMAL HIGH (ref 65–99)

## 2017-04-18 MED ORDER — OXYCODONE HCL 5 MG PO TABS: 5.0000 mg | ORAL_TABLET | Freq: Four times a day (QID) | ORAL | 0 refills | Status: DC | PRN

## 2017-04-18 MED ORDER — FUROSEMIDE 20 MG PO TABS: 20.0000 mg | ORAL_TABLET | Freq: Every day | ORAL | Status: DC

## 2017-04-18 MED ORDER — CIPROFLOXACIN HCL 500 MG PO TABS: 500.0000 mg | ORAL_TABLET | Freq: Two times a day (BID) | ORAL | 0 refills | Status: DC

## 2017-04-18 MED ORDER — GLUCERNA PO LIQD: 237.0000 mL | Freq: Two times a day (BID) | ORAL | Status: DC

## 2017-04-18 MED ORDER — INSULIN ASPART 100 UNIT/ML ~~LOC~~ SOLN: 0.0000 [IU] | Freq: Three times a day (TID) | SUBCUTANEOUS | 11 refills | Status: DC

## 2017-04-18 MED ORDER — ALPRAZOLAM 0.5 MG PO TABS
0.5000 mg | ORAL_TABLET | Freq: Three times a day (TID) | ORAL | Status: DC | PRN
  Filled 2017-04-18: qty 1

## 2017-04-18 MED ORDER — ALPRAZOLAM 0.5 MG PO TABS: 0.5000 mg | ORAL_TABLET | Freq: Three times a day (TID) | ORAL | 0 refills | Status: DC | PRN

## 2017-04-18 MED ORDER — INSULIN ASPART 100 UNIT/ML ~~LOC~~ SOLN: 0.0000 [IU] | Freq: Every day | SUBCUTANEOUS | 11 refills | Status: DC

## 2017-04-18 MED ORDER — GLUCERNA PO LIQD: 237.0000 mL | Freq: Two times a day (BID) | ORAL | 0 refills | Status: DC

## 2017-04-18 MED ORDER — SULFAMETHOXAZOLE-TRIMETHOPRIM 800-160 MG PO TABS: 1.0000 | ORAL_TABLET | Freq: Two times a day (BID) | ORAL | 0 refills | Status: DC

## 2017-04-18 MED ORDER — ACETAMINOPHEN 325 MG PO TABS: 650.0000 mg | ORAL_TABLET | Freq: Four times a day (QID) | ORAL | Status: DC | PRN

## 2017-04-18 MED ORDER — ONDANSETRON HCL 4 MG PO TABS: 4.0000 mg | ORAL_TABLET | Freq: Four times a day (QID) | ORAL | 0 refills | Status: DC | PRN

## 2017-04-18 NOTE — Discharge Summary (Signed)
[name] at [place] [name]    MR#:  [id number]  DATE OF BIRTH:  03/26/1925  DATE OF ADMISSION:  04/13/2017 ADMITTING PHYSICIAN: Sarah Coon, MD  DATE OF DISCHARGE: 04/18/17   PRIMARY CARE PHYSICIAN: Sarah Harrier, MD    ADMISSION DIAGNOSIS:  Left leg cellulitis [S34.196] Community acquired pneumonia, unspecified laterality [J18.9]  DISCHARGE DIAGNOSIS:  Principal Problem:   Sepsis ([place]) Active Problems:   Atrial fibrillation ([place])   Essential hypertension   Diabetes ([place])   COPD (chronic obstructive pulmonary disease) ([place])   CAP (community acquired pneumonia)   SECONDARY DIAGNOSIS:   Past Medical History:  Diagnosis Date  . Cancer ([place])    skin ca  . CHF (congestive heart failure) ([place])   . Chronic back pain   . COPD (chronic obstructive pulmonary disease) ([place])   . Diabetes mellitus without complication ([place])   . Hypertension   . Poor perfusion of leg     HOSPITAL COURSE:   hpi  [name]  is a 83 y.o. female who presents with shortness of breath. Patient is unable to relate information for her history that she has baseline dementia and currently has significant confusion. Husband was present here with her earlier and states that she has been getting progressively short of breath over a couple of days, and had a fever. Here she is still febrile, and workup shows sepsis due to pneumonia. Hospitalists were called for admission  Bacteremia with cultures positive with Escherichia coli sensitive to ciprofloxacin Discharge patient with ciprofloxacin  CAP (community acquired pneumonia) - IV antibiotics and nebulizers as needed,clinically improved and will discharge with the ciprofloxacin  Left lower extremity cellulitis- meropenem changed to by mouth Bactrim ruled out DVT with negvenous Dopplers   Atrial fibrillation ([place]) - continue  rate controlling meds and anticoagulation with  Xarelto  Essential hypertension -hold antihypertensives as patient blood pressure is soft  Diabetes (HCC) - sliding scale insulin with corresponding glucose checks Patient is started on Lantus 8 units daily for basal coverage  COPD (chronic obstructive pulmonary disease) ([employer]) - home dose inhalers   Generalized weakness PT evaluation-Recommending skilled nursing facility family and patient are agreeable  DISCHARGE CONDITIONS:   fair  CONSULTS OBTAINED:     PROCEDURES  None   DRUG ALLERGIES:   Allergies  Allergen Reactions  . Ceftriaxone Swelling  . Cefuroxime Hives  . Cephalosporins Swelling and Other (See Comments)    Reaction:  Fainting/dry mouth   . Codeine Nausea And Vomiting  . Epinephrine Other (See Comments)    Reaction:  Fainting   . Morphine And Related Other (See Comments)    Reaction:  GI upset   . Buprenorphine Hcl Nausea And Vomiting  . Ciprofloxacin Itching, Swelling and Rash  . Latex Rash  . Lidocaine Rash  . Procaine Rash    DISCHARGE MEDICATIONS:   Current Discharge Medication List    START taking these medications   Details  acetaminophen (TYLENOL) 325 MG tablet Take 2 tablets (650 mg total) by mouth every 6 (six) hours as needed for mild pain (or Fever >/= 101).    ALPRAZolam (XANAX) 0.5 MG tablet Take 1 tablet (0.5 mg total) by mouth every 8 (eight) hours as needed for anxiety. Qty: 20 tablet, Refills: 0    ciprofloxacin (CIPRO) 500 MG tablet Take 1 tablet (500 mg total) by mouth 2 (two) times daily. Qty: 14 tablet, Refills: 0    GLUCERNA (GLUCERNA) LIQD Take  237 mLs by mouth 2 (two) times daily between meals. Qty: 60 Can, Refills: 0    !! insulin aspart (NOVOLOG) 100 UNIT/ML injection Inject 0-9 Units into the skin 3 (three) times daily with meals. Qty: 10 mL, Refills: 11    !! insulin aspart (NOVOLOG) 100 UNIT/ML injection Inject 0-5 Units into the skin at bedtime. Qty: 10 mL, Refills: 11    ondansetron (ZOFRAN) 4 MG  tablet Take 1 tablet (4 mg total) by mouth every 6 (six) hours as needed for nausea. Qty: 20 tablet, Refills: 0    sulfamethoxazole-trimethoprim (BACTRIM DS,SEPTRA DS) 800-160 MG tablet Take 1 tablet by mouth every 12 (twelve) hours. Qty: 14 tablet, Refills: 0     !! - Potential duplicate medications found. Please discuss with provider.    CONTINUE these medications which have CHANGED   Details  furosemide (LASIX) 20 MG tablet Take 1 tablet (20 mg total) by mouth daily. Pt alternates with the 80mg  tablet.    oxyCODONE (OXY IR/ROXICODONE) 5 MG immediate release tablet Take 1 tablet (5 mg total) by mouth every 6 (six) hours as needed for moderate pain. Qty: 30 tablet, Refills: 0      CONTINUE these medications which have NOT CHANGED   Details  aspirin EC 81 MG tablet Take 81 mg by mouth daily.    baclofen (LIORESAL) 10 MG tablet Take 10 mg by mouth 3 (three) times daily.    carvedilol (COREG) 6.25 MG tablet Take 6.25 mg by mouth 2 (two) times daily with a meal.     cholecalciferol (VITAMIN D) 1000 units tablet Take 1,000 Units by mouth daily.    docusate sodium (COLACE) 100 MG capsule Take 100 mg by mouth 2 (two) times daily.     escitalopram (LEXAPRO) 10 MG tablet Take 10 mg by mouth daily.    ferrous sulfate 325 (65 FE) MG tablet Take 325 mg by mouth daily with breakfast.    gabapentin (NEURONTIN) 100 MG capsule Take 100-200 mg by mouth at bedtime.    magnesium oxide (MAG-OX) 400 MG tablet Take 400 mg by mouth daily.    Melatonin 1 MG TABS Take 1 mg by mouth at bedtime.    Multiple Vitamin (MULTIVITAMIN) tablet Take 1 tablet by mouth daily.    pantoprazole (PROTONIX) 40 MG tablet Take 40 mg by mouth daily.    Rivaroxaban (XARELTO) 15 MG TABS tablet Take 15 mg by mouth daily.    rOPINIRole (REQUIP) 0.5 MG tablet Take 0.5 mg by mouth 2 (two) times daily.    spironolactone (ALDACTONE) 25 MG tablet Take 25 mg by mouth daily.     vitamin B-12 (CYANOCOBALAMIN) 1000 MCG  tablet Take 1,000 mcg by mouth daily.    vitamin C (ASCORBIC ACID) 500 MG tablet Take 500 mg by mouth daily.    Vitamin D, Ergocalciferol, (DRISDOL) 50000 UNITS CAPS capsule Take 50,000 Units by mouth every 7 (seven) days. Pt takes on Thursday.    albuterol (PROVENTIL HFA;VENTOLIN HFA) 108 (90 Base) MCG/ACT inhaler Inhale 1-2 puffs into the lungs every 6 (six) hours as needed for wheezing or shortness of breath. Qty: 1 Inhaler, Refills: 0    budesonide-formoterol (SYMBICORT) 160-4.5 MCG/ACT inhaler Inhale 2 puffs into the lungs 2 (two) times daily. Qty: 1 Inhaler, Refills: 12      STOP taking these medications     fentaNYL (DURAGESIC - DOSED MCG/HR) 25 MCG/HR patch      glimepiride (AMARYL) 4 MG tablet      potassium chloride (  KLOR-CON) 20 MEQ packet      sitaGLIPtin (JANUVIA) 100 MG tablet      insulin aspart (NOVOLOG FLEXPEN) 100 UNIT/ML FlexPen          DISCHARGE INSTRUCTIONS:   Follow-up with primary care physician at the facility in 5-7 days  continue 2 L of oxygen via nasal cannula   DIET:  Diabetic diet  DISCHARGE CONDITION:  Stable  ACTIVITY:  Activity as tolerated per PT  OXYGEN:  Home Oxygen: Yes.     Oxygen Delivery: 2 liters/min via Patient connected to nasal cannula oxygen  DISCHARGE LOCATION:  nursing home   If you experience worsening of your admission symptoms, develop shortness of breath, life threatening emergency, suicidal or homicidal thoughts you must seek medical attention immediately by calling 911 or calling your MD immediately  if symptoms less severe.  You Must read complete instructions/literature along with all the possible adverse reactions/side effects for all the Medicines you take and that have been prescribed to you. Take any new Medicines after you have completely understood and accpet all the possible adverse reactions/side effects.   Please note  You were cared for by a hospitalist during your hospital stay. If you have any  questions about your discharge medications or the care you received while you were in the hospital after you are discharged, you can call the unit and asked to speak with the hospitalist on call if the hospitalist that took care of you is not available. Once you are discharged, your primary care physician will handle any further medical issues. Please note that NO REFILLS for any discharge medications will be authorized once you are discharged, as it is imperative that you return to your primary care physician (or establish a relationship with a primary care physician if you do not have one) for your aftercare needs so that they can reassess your need for medications and monitor your lab values.     Today  Chief Complaint  Patient presents with  . Code Sepsis    Patient is feeling weak but otherwise doing fine. Ambulated with the physical therapist in the hallway Leg redness is improving  ROS:  CONSTITUTIONAL: Denies fevers, chills. Denies any fatigue, weakness.  EYES: Denies blurry vision, double vision, eye pain. EARS, NOSE, THROAT: Denies tinnitus, ear pain, hearing loss. RESPIRATORY: Denies cough, wheeze, shortness of breath.  CARDIOVASCULAR: Denies chest pain, palpitations, edema.  GASTROINTESTINAL: Denies nausea, vomiting, diarrhea, abdominal pain. Denies bright red blood per rectum. GENITOURINARY: Denies dysuria, hematuria. ENDOCRINE: Denies nocturia or thyroid problems. HEMATOLOGIC AND LYMPHATIC: Denies easy bruising or bleeding. SKIN: LLE redness is improving MUSCULOSKELETAL: Denies pain in neck, back, shoulder, knees, hips or arthritic symptoms.  NEUROLOGIC: Denies paralysis, paresthesias.  PSYCHIATRIC: Denies anxiety or depressive symptoms.   VITAL SIGNS:  Blood pressure (!) 108/44, pulse 77, temperature 97.9 F (36.6 C), resp. rate 18, height 5\' 7"  (1.702 m), weight 78.9 kg (174 lb), SpO2 97 %.  I/O:    Intake/Output Summary (Last 24 hours) at 04/18/17 1440 Last data  filed at 04/17/17 1904  Gross per 24 hour  Intake              120 ml  Output                0 ml  Net              120 ml    PHYSICAL EXAMINATION:  GENERAL:  83 y.o.-year-old patient lying in the bed with no  acute distress.  EYES: Pupils equal, round, reactive to light and accommodation. No scleral icterus. Extraocular muscles intact.  HEENT: Head atraumatic, normocephalic. Oropharynx and nasopharynx clear.  NECK:  Supple, no jugular venous distention. No thyroid enlargement, no tenderness.  LUNGS: Normal breath sounds bilaterally, no wheezing, rales,rhonchi or crepitation. No use of accessory muscles of respiration.  CARDIOVASCULAR: S1, S2 normal. No murmurs, rubs, or gallops.  ABDOMEN: Soft, non-tender, non-distended. Bowel sounds present. No organomegaly or mass.  EXTREMITIES: left lower extremity erythema and redness are improving. Patient has chronic venous changes NEUROLOGIC: Cranial nerves II through XII are intact. Muscle strength 5/5 in all extremities. Sensation intact. Gait not checked.  PSYCHIATRIC: The patient is alert and oriented x 3.  SKIN: No obvious rash, lesion, or ulcer.   DATA REVIEW:   CBC  Recent Labs Lab 04/16/17 0608  WBC 8.4  HGB 10.0*  HCT 31.2*  PLT 94*    Chemistries   Recent Labs Lab 04/13/17 2356  04/15/17 0404  NA 136  < > 136  K 5.0  < > 4.7  CL 102  < > 105  CO2 26  < > 27  GLUCOSE 360*  < > 134*  BUN 47*  < > 37*  CREATININE 1.15*  < > 1.11*  CALCIUM 8.8*  < > 8.3*  AST 29  --   --   ALT 23  --   --   ALKPHOS 55  --   --   BILITOT 1.1  --   --   < > = values in this interval not displayed.  Cardiac Enzymes No results for input(s): TROPONINI in the last 168 hours.  Microbiology Results  Results for orders placed or performed during the hospital encounter of 04/13/17  Blood Culture (routine x 2)     Status: Abnormal   Collection Time: 04/13/17 11:57 PM  Result Value Ref Range Status   Specimen Description BLOOD LEFT  ANTECUBITAL  Final   Special Requests   Final    BOTTLES DRAWN AEROBIC AND ANAEROBIC Blood Culture results may not be optimal due to an excessive volume of blood received in culture bottles   Culture  Setup Time   Final    GRAM NEGATIVE RODS ANAEROBIC BOTTLE ONLY CRITICAL RESULT CALLED TO, READ BACK BY AND VERIFIED WITH: Dicie Beam AT 2025 04/14/17 SDR GRAM STAIN REVIEWED-AGREE WITH RESULT Performed at [facility], [place] [address] [place], [place] [address]    Culture ESCHERICHIA COLI (A)  Final   Report Status 04/16/2017 FINAL  Final   Organism ID, Bacteria ESCHERICHIA COLI  Final      Susceptibility   Escherichia coli - MIC*    AMPICILLIN >=32 RESISTANT Resistant     CEFAZOLIN 16 SENSITIVE Sensitive     CEFEPIME <=1 SENSITIVE Sensitive     CEFTAZIDIME <=1 SENSITIVE Sensitive     CEFTRIAXONE <=1 SENSITIVE Sensitive     CIPROFLOXACIN <=0.25 SENSITIVE Sensitive     GENTAMICIN <=1 SENSITIVE Sensitive     IMIPENEM <=0.25 SENSITIVE Sensitive     TRIMETH/SULFA >=320 RESISTANT Resistant     AMPICILLIN/SULBACTAM >=32 RESISTANT Resistant     PIP/TAZO <=4 SENSITIVE Sensitive     Extended ESBL NEGATIVE Sensitive     * ESCHERICHIA COLI  Blood Culture ID Panel (Reflexed)     Status: Abnormal   Collection Time: 04/13/17 11:57 PM  Result Value Ref Range Status   Enterococcus species NOT DETECTED NOT DETECTED Final   Listeria monocytogenes  NOT DETECTED NOT DETECTED Final   Staphylococcus species NOT DETECTED NOT DETECTED Final   Staphylococcus aureus NOT DETECTED NOT DETECTED Final   Streptococcus species NOT DETECTED NOT DETECTED Final   Streptococcus agalactiae NOT DETECTED NOT DETECTED Final   Streptococcus pneumoniae NOT DETECTED NOT DETECTED Final   Streptococcus pyogenes NOT DETECTED NOT DETECTED Final   Acinetobacter baumannii NOT DETECTED NOT DETECTED Final   Enterobacteriaceae species DETECTED (A) NOT DETECTED Final    Comment: Enterobacteriaceae represent a large family  of gram-negative bacteria, not a single organism. CRITICAL RESULT CALLED TO, READ BACK BY AND VERIFIED WITH:  Dicie Beam AT 8099 04/14/17 SDR    Enterobacter cloacae complex NOT DETECTED NOT DETECTED Final   Escherichia coli DETECTED (A) NOT DETECTED Final    Comment: CRITICAL RESULT CALLED TO, READ BACK BY AND VERIFIED WITH:  Dicie Beam AT 8338 04/14/17 SDR    Klebsiella oxytoca NOT DETECTED NOT DETECTED Final   Klebsiella pneumoniae NOT DETECTED NOT DETECTED Final   Proteus species NOT DETECTED NOT DETECTED Final   Serratia marcescens NOT DETECTED NOT DETECTED Final   Carbapenem resistance NOT DETECTED NOT DETECTED Final   Haemophilus influenzae NOT DETECTED NOT DETECTED Final   Neisseria meningitidis NOT DETECTED NOT DETECTED Final   Pseudomonas aeruginosa NOT DETECTED NOT DETECTED Final   Candida albicans NOT DETECTED NOT DETECTED Final   Candida glabrata NOT DETECTED NOT DETECTED Final   Candida krusei NOT DETECTED NOT DETECTED Final   Candida parapsilosis NOT DETECTED NOT DETECTED Final   Candida tropicalis NOT DETECTED NOT DETECTED Final  Blood Culture (routine x 2)     Status: Abnormal   Collection Time: 04/14/17 12:05 AM  Result Value Ref Range Status   Specimen Description BLOOD LEFT ANTECUBITAL  Final   Special Requests   Final    BOTTLES DRAWN AEROBIC AND ANAEROBIC Blood Culture results may not be optimal due to an excessive volume of blood received in culture bottles   Culture  Setup Time   Final    GRAM NEGATIVE RODS IN BOTH AEROBIC AND ANAEROBIC BOTTLES CRITICAL RESULT CALLED TO, READ BACK BY AND VERIFIED WITH: Aura Camps JAMES AT 2505 04/14/17 SDR    Culture (A)  Final    ESCHERICHIA COLI SUSCEPTIBILITIES PERFORMED ON PREVIOUS CULTURE WITHIN THE LAST 5 DAYS. Performed at [facility], [place] [address] [place], [place] [address]    Report Status 04/17/2017 FINAL  Final    RADIOLOGY:  Dg Chest 2 View  Result Date: 04/16/2017 CLINICAL DATA:   Shortness of Breath EXAM: CHEST  2 VIEW COMPARISON:  04/13/2017 FINDINGS: Cardiac shadow is enlarged. Pacing device is again seen. Postsurgical changes are noted. The lungs are well aerated bilaterally with some slight increase in the degree of central vascular congestion. No focal confluent infiltrate is seen. Mild right basilar atelectasis remains. No acute bony abnormality is seen. IMPRESSION: Stable atelectatic changes in the right base. Mild vascular congestion is now seen. Electronically Signed   By: Inez Catalina M.D.   On: 04/16/2017 14:07    EKG:   Orders placed or performed during the hospital encounter of 04/13/17  . ED EKG 12-Lead  . ED EKG 12-Lead  . EKG 12-Lead  . EKG 12-Lead      Management plans discussed with the patient, family and they are in agreement.  CODE STATUS:     Code Status Orders        Start     Ordered   04/14/17 [phone]  Full code  Continuous     04/14/17 0344    Code Status History    Date Active Date Inactive Code Status Order ID Comments User Context   12/08/2016  1:07 AM 12/09/2016  8:31 PM Full Code [id number]  Sarah Coon, MD ED   09/07/2015  4:41 AM 09/13/2015  7:55 PM Full Code [id number]  Saundra Shelling, MD Inpatient   09/01/2015  3:33 PM 09/03/2015  7:07 PM Full Code [id number]  Epifanio Lesches, MD ED   03/21/2015  6:26 AM 03/22/2015  7:39 PM Full Code [id number]  Harrie Foreman, MD Inpatient    Advance Directive Documentation     Most Recent Value  Type of Advance Directive  Healthcare Power of Attorney, Living will  Pre-existing out of facility DNR order (yellow form or pink MOST form)  -  "MOST" Form in Place?  -      TOTAL TIME TAKING CARE OF THIS PATIENT: 45  minutes.   Note: This dictation was prepared with Dragon dictation along with smaller phrase technology. Any transcriptional errors that result from this process are unintentional.   @[facility] @  on 04/18/2017 at 2:40 PM  Between 7am to 6pm - Pager - [phone]  After 6pm  go to [email or web address] - password [email or web address] [facility]  Office  [phone]  CC: Primary care physician; Sarah Harrier, MD

## 2017-04-18 NOTE — Progress Notes (Signed)
Physical Therapy Treatment Patient Details [name] MRN: [id number] DOB: 1925/06/15 Today's Date: 04/18/2017    History of Present Illness Pt is an 83 y.o. F with PMH including a-fib, skin cancer, CHF, chronic back pain, COPD, diabetes mellitus, and HTN. Presented to ED on 04/13/17 with dyspnea, vomiting, abdominal discomfort, fever and cough secondary to community acquired pneumonia, sepsis secondary to CAP and LLE cellulitis. Doppler of LLE negative for DVT.    PT Comments    Pt able to progress to ambulating 15 feet with RW min assist x1 plus chair follow (limited distance d/t fatigue but improved strength in LE's noted).  O2 sats 92% or greater on 2 L O2 via nasal cannula during session.  Pt continues to demonstrate posterior lean with standing requiring cueing and assist to correct but pt able to correct more quickly on subsequent attempt standing.  Pt and pt's husband initially verbalizing concern regarding pt's progress with PT (and pt reporting almost fall yesterday when attempting to transfer back to chair with staff) but pt appearing with improved confidence end of session.  Pt also does significantly better when she wears her own shoes (feet sleep forward when wearing socks with grips); nursing notified.  Recommend pt discharge to STR when medically appropriate (CM and SW notified).   Follow Up Recommendations  SNF     Equipment Recommendations  Rolling walker with 5" wheels (Pt owns recommended DME)    Recommendations for Other Services       Precautions / Restrictions Precautions Precautions: Fall Restrictions Weight Bearing Restrictions: No    Mobility  Bed Mobility Overal bed mobility: Needs Assistance Bed Mobility: Supine to Sit     Supine to sit: Min guard     General bed mobility comments: increased effort and time to perform; use of bed rail  Transfers Overall transfer level: Needs assistance Equipment used: Rolling walker (2  wheeled) Transfers: Sit to/from [facility] Sit to Stand: Mod assist Stand pivot transfers: Min assist;Min guard;+2 safety/equipment       General transfer comment: mod assist to stand from bed with RW (vc's to stand upright and shift weight forward d/t posterior lean); stand step turn bed to recliner min assist x1 plus CGA of 2nd for safety and use of RW; sit to/from stand from recliner min to mod assist x1 (same cues as standing from bed but able to correct quicker this time)  Ambulation/Gait Ambulation/Gait assistance: Min assist Ambulation Distance (Feet): 15 Feet Assistive device: Rolling walker (2 wheeled)   Gait velocity: decreased   General Gait Details: mildly flexed knees entire ambulation but no buckling noted; decreased B step through gait pattern; limited distance d/t fatigue   Stairs            Wheelchair Mobility    Modified Rankin (Stroke Patients Only)       Balance Overall balance assessment: Needs assistance Sitting-balance support: No upper extremity supported;Feet supported Sitting balance-Leahy Scale: Good Sitting balance - Comments: sitting reaching within BOS   Standing balance support: Bilateral upper extremity supported Standing balance-Leahy Scale: Poor Standing balance comment: posterior lean in standing with RW requiring assist and vc's to correct posture and shift weight forward                            Cognition Arousal/Alertness: Awake/alert Behavior During Therapy: WFL for tasks assessed/performed Overall Cognitive Status: Within Functional Limits for tasks assessed  Exercises      General Comments   Nursing cleared pt for participation in physical therapy.  Pt agreeable to PT session.  Pt's husband present during session.      Pertinent Vitals/Pain Pain Assessment: 0-10 Pain Score: 4  Pain Location: chronic low back and acute L LE pain Pain  Descriptors / Indicators: Sore;Aching;Tender Pain Intervention(s): Limited activity within patient's tolerance;Monitored during session;Premedicated before session;Repositioned  HR WFL during session.    Home Living                      Prior Function            PT Goals (current goals can now be found in the care plan section) Acute Rehab PT Goals Patient Stated Goal: to go home PT Goal Formulation: With patient Time For Goal Achievement: 04/29/17 Potential to Achieve Goals: Good Progress towards PT goals: Progressing toward goals    Frequency    Min 2X/week      PT Plan Current plan remains appropriate    Co-evaluation              AM-PAC PT "6 Clicks" Daily Activity  Outcome Measure  Difficulty turning over in bed (including adjusting bedclothes, sheets and blankets)?: A Little Difficulty moving from lying on back to sitting on the side of the bed? : A Lot Difficulty sitting down on and standing up from a chair with arms (e.g., wheelchair, bedside commode, etc,.)?: Unable Help needed moving to and from a bed to chair (including a wheelchair)?: A Lot Help needed walking in hospital room?: A Lot Help needed climbing 3-5 steps with a railing? : Total 6 Click Score: 11    End of Session Equipment Utilized During Treatment: Gait belt;Oxygen Activity Tolerance: Patient limited by fatigue Patient left: in chair;with call bell/phone within reach;with chair alarm set;with family/visitor present (L LE elevated on pillow) Nurse Communication: Mobility status;Precautions;Other (comment) (use 2 assist and have pt wear her shoes for transfer back to bed for safety; pt's posterior lean initially with standing) PT Visit Diagnosis: Unsteadiness on feet (R26.81);Other abnormalities of gait and mobility (R26.89);Muscle weakness (generalized) (M62.81);Pain Pain - Right/Left: Left Pain - part of body: Leg     Time: 1330-1411 PT Time Calculation (min) (ACUTE ONLY): 41  min  Charges:  $Gait Training: 8-22 mins $Therapeutic Activity: 23-37 mins                    G CodesLeitha Bleak, PT 04/18/17, 4:04 PM [phone]

## 2017-04-18 NOTE — Clinical Social Work Placement (Signed)
   CLINICAL SOCIAL WORK PLACEMENT  NOTE  Date:  04/18/2017  Patient Details  [name] MRN: [id number] Date of Birth: Jun 30, 1924  Clinical Social Work is seeking post-discharge placement for this patient at the [place] level of care (*CSW will initial, date and re-position this form in  chart as items are completed):  Yes   Patient/family provided with [facility] Work Department's list of facilities offering this level of care within the geographic area requested by the patient (or if unable, by the patient's family).  Yes   Patient/family informed of their freedom to choose among providers that offer the needed level of care, that participate in Medicare, Medicaid or managed care program needed by the patient, have an available bed and are willing to accept the patient.  Yes   Patient/family informed of [facility]'s ownership interest in [facility] and [facility], as well as of the fact that they are under no obligation to receive care at these facilities.  PASRR submitted to EDS on       PASRR number received on       Existing PASRR number confirmed on 04/16/17     FL2 transmitted to all facilities in geographic area requested by pt/family on 04/16/17     FL2 transmitted to all facilities within larger geographic area on       Patient informed that his/her managed care company has contracts with or will negotiate with certain facilities, including the following:        Yes   Patient/family informed of bed offers received.  Patient chooses bed at  [facility] )     Physician recommends and patient chooses bed at      Patient to be transferred to  [facility] ) on 04/18/17.  Patient to be transferred to facility by  [facility] )     Patient family notified on 04/18/17 of transfer.  Name of family member notified:   (Patient's husband Vicente Serene is at bedside and aware of D/C today. )     PHYSICIAN        Additional Comment:    _______________________________________________ Kealy Lewter, Veronia Beets, LCSW 04/18/2017, 3:05 PM

## 2017-04-18 NOTE — Discharge Instructions (Signed)
Follow-up with primary care physician at the facility in 5-7 days  continue 2 L of oxygen via nasal cannula

## 2017-04-18 NOTE — Progress Notes (Signed)
Inpatient Diabetes Program Recommendations  AACE/ADA: New Consensus Statement on Inpatient Glycemic Control (2015)  Target Ranges:  Prepandial:   less than 140 mg/dL      Peak postprandial:   less than 180 mg/dL (1-2 hours)      Critically ill patients:  140 - 180 mg/dL   Lab Results  Component Value Date   GLUCAP 135 (H) 04/18/2017   HGBA1C 8.4 (H) 04/15/2017    Review of Glycemic Control  Results for JEYLI, ZWICKER (MRN [id number]) as of 04/18/2017 09:37  Ref. Range 04/17/2017 07:25 04/17/2017 11:27 04/17/2017 17:06 04/17/2017 21:33 04/18/2017 07:39  Glucose-Capillary Latest Ref Range: 65 - 99 mg/dL 163 (H) 231 (H) 175 (H) 187 (H) 135 (H)    Diabetes history:DM2 Outpatient Diabetes medications: Amaryl 4 mg BID, Januvia 100 mg daily, Novolog 5 units with lunch if CBG over 200 mg/dl  Current orders for Inpatient glycemic control: Lantus 8 units + Novolog 0-9 units TID with meals, Novolog 0-5 units QHS  Inpatient Diabetes Program Recommendations: Consider adding Novolog 2 units tid, continue Novolog correction scale 0-9 units tid.  Gentry Fitz, RN, BA, MHA, CDE Diabetes Coordinator Inpatient Diabetes Program  [phone] (Team Pager) [phone] ([place]) 04/18/2017 9:39 AM

## 2017-04-18 NOTE — Progress Notes (Signed)
Patient is medically stable for D/C to [facility] today. Per [facility] admissions coordinator at [facility] patient can come today to room E-10. RN will call report and arrange EMS for transport. Clinical Education officer, museum (CSW) sent D/C orders to [facility] via [employer]. Patient and her husband Vicente Serene are aware of above. CSW received a call from patient's son asking if patient can go to [facility]. Per son patient's husband still wants [facility]. CSW explained to son that we can't go against the husband's wishes. Son verbalized his understanding. Please reconsult if future social work needs arise. CSW signing off.   [employer], LCSW [phone]

## 2017-05-08 ENCOUNTER — Emergency Department: Payer: Medicare Other

## 2017-05-08 ENCOUNTER — Emergency Department
Admission: EM | Admit: 2017-05-08 | Discharge: 2017-05-09 | Disposition: A | Discharge status: Home / Self Care | Payer: Medicare Other | Attending: Emergency Medicine | Admitting: Emergency Medicine

## 2017-05-08 ENCOUNTER — Encounter: Payer: Self-pay | Admitting: Emergency Medicine

## 2017-05-08 ENCOUNTER — Other Ambulatory Visit: Payer: Self-pay

## 2017-05-08 DIAGNOSIS — Z7982 Long term (current) use of aspirin: Secondary | ICD-10-CM | POA: Insufficient documentation

## 2017-05-08 DIAGNOSIS — Z950 Presence of cardiac pacemaker: Secondary | ICD-10-CM | POA: Insufficient documentation

## 2017-05-08 DIAGNOSIS — E119 Type 2 diabetes mellitus without complications: Secondary | ICD-10-CM | POA: Diagnosis not present

## 2017-05-08 DIAGNOSIS — J449 Chronic obstructive pulmonary disease, unspecified: Secondary | ICD-10-CM | POA: Diagnosis not present

## 2017-05-08 DIAGNOSIS — I110 Hypertensive heart disease with heart failure: Secondary | ICD-10-CM | POA: Insufficient documentation

## 2017-05-08 DIAGNOSIS — Z91040 Latex allergy status: Secondary | ICD-10-CM | POA: Diagnosis not present

## 2017-05-08 DIAGNOSIS — Z955 Presence of coronary angioplasty implant and graft: Secondary | ICD-10-CM | POA: Diagnosis not present

## 2017-05-08 DIAGNOSIS — I5042 Chronic combined systolic (congestive) and diastolic (congestive) heart failure: Secondary | ICD-10-CM | POA: Diagnosis not present

## 2017-05-08 DIAGNOSIS — M795 Residual foreign body in soft tissue: Secondary | ICD-10-CM | POA: Diagnosis present

## 2017-05-08 DIAGNOSIS — Z794 Long term (current) use of insulin: Secondary | ICD-10-CM | POA: Diagnosis not present

## 2017-05-08 DIAGNOSIS — Z79899 Other long term (current) drug therapy: Secondary | ICD-10-CM | POA: Diagnosis not present

## 2017-05-08 DIAGNOSIS — Z711 Person with feared health complaint in whom no diagnosis is made: Secondary | ICD-10-CM | POA: Insufficient documentation

## 2017-05-08 NOTE — ED Triage Notes (Signed)
Pt presents to ED from home with caregiver. Caregiver concerned that pt may have a insulin needle stuck in her lower abd from getting her insulin shot. After administering the medication there was no needle on the syringe and unsure where it went. Pt has no c/o pain. No bleeding present upon exam.

## 2017-05-09 NOTE — ED Provider Notes (Signed)
[facility] Emergency Department Provider Note   ____________________________________________   First MD Initiated Contact with Patient 05/09/17 0007     (approximate)  I have reviewed the triage vital signs and the nursing notes.   HISTORY  Chief Complaint Foreign Body in Skin    HPI [name] is a 83 y.o. female who comes into the hospital tonight with a concern for retained insulin needle in her abdomen.  The patient is from a nursing home and the staff reports that she was given her insulin tonight with her pen.  Once the pin was removed they could no longer see the insulin needle.  They are unsure if the needle might of fallen off or gotten stuck in her skin.  They decided to come and get it checked out.  The patient was recently admitted to the hospital for bilateral pneumonia.  She otherwise has no other complaints.  The staff reports that they felt the site but did not feel the needle.  She is also not had any bleeding or pain in the area.  She is here for evaluation.   Past Medical History:  Diagnosis Date  . Cancer ([place])    skin ca  . CHF (congestive heart failure) ([place])   . Chronic back pain   . COPD (chronic obstructive pulmonary disease) ([place])   . Diabetes mellitus without complication ([place])   . Hypertension   . Poor perfusion of leg     Patient Active Problem List   Diagnosis Date Noted  . Sepsis ([place]) 04/14/2017  . CAP (community acquired pneumonia) 04/14/2017  . COPD (chronic obstructive pulmonary disease) ([place]) 12/07/2016  . UTI (urinary tract infection) 12/07/2016  . Ventral hernia without obstruction or gangrene   . Pneumonia 09/07/2015  . Hyponatremia 09/07/2015  . Acute respiratory failure ([place]) 09/01/2015  . Atrial fibrillation ([place]) 04/10/2015  . Essential hypertension 04/10/2015  . Diabetes ([place]) 04/10/2015  . Back pain 04/10/2015  . Acute on chronic combined systolic and diastolic CHF (congestive heart  failure) ([place]) 03/21/2015  . Pulmonary nodule 11/10/2014    Past Surgical History:  Procedure Laterality Date  . ABDOMINAL HYSTERECTOMY    . BACK SURGERY     x3  . CORONARY STENT PLACEMENT     unknown location per pt  . FOOT SURGERY    . LEG SURGERY    . PACEMAKER PLACEMENT    . TONSILLECTOMY      Prior to Admission medications   Medication Sig Start Date End Date Taking? Authorizing Provider  acetaminophen (TYLENOL) 325 MG tablet Take 2 tablets (650 mg total) by mouth every 6 (six) hours as needed for mild pain (or Fever >/= 101). 04/18/17   Gouru, Illene Silver, MD  albuterol (PROVENTIL HFA;VENTOLIN HFA) 108 (90 Base) MCG/ACT inhaler Inhale 1-2 puffs into the lungs every 6 (six) hours as needed for wheezing or shortness of breath. Patient not taking: Reported on 04/14/2017 09/22/16   Harrie Foreman, MD  ALPRAZolam Duanne Moron) 0.5 MG tablet Take 1 tablet (0.5 mg total) by mouth every 8 (eight) hours as needed for anxiety. 04/18/17   Nicholes Mango, MD  aspirin EC 81 MG tablet Take 81 mg by mouth daily.    [provider]  baclofen (LIORESAL) 10 MG tablet Take 10 mg by mouth 3 (three) times daily.    [provider]  budesonide-formoterol (SYMBICORT) 160-4.5 MCG/ACT inhaler Inhale 2 puffs into the lungs 2 (two) times daily. Patient not taking: Reported on 04/14/2017  09/03/15   Gladstone Lighter, MD  carvedilol (COREG) 6.25 MG tablet Take 6.25 mg by mouth 2 (two) times daily with a meal.     [provider]  cholecalciferol (VITAMIN D) 1000 units tablet Take 1,000 Units by mouth daily.    [provider]  ciprofloxacin (CIPRO) 500 MG tablet Take 1 tablet (500 mg total) by mouth 2 (two) times daily. 04/18/17   Nicholes Mango, MD  docusate sodium (COLACE) 100 MG capsule Take 100 mg by mouth 2 (two) times daily.     [provider]  escitalopram (LEXAPRO) 10 MG tablet Take 10 mg by mouth daily.    [provider]  ferrous sulfate 325 (65 FE) MG  tablet Take 325 mg by mouth daily with breakfast.    [provider]  furosemide (LASIX) 20 MG tablet Take 1 tablet (20 mg total) by mouth daily. Pt alternates with the 80mg  tablet. 04/18/17   Nicholes Mango, MD  gabapentin (NEURONTIN) 100 MG capsule Take 100-200 mg by mouth at bedtime.    [provider]  GLUCERNA (GLUCERNA) LIQD Take 237 mLs by mouth 2 (two) times daily between meals. 04/18/17   Gouru, Illene Silver, MD  insulin aspart (NOVOLOG) 100 UNIT/ML injection Inject 0-9 Units into the skin 3 (three) times daily with meals. 04/18/17   Gouru, Illene Silver, MD  insulin aspart (NOVOLOG) 100 UNIT/ML injection Inject 0-5 Units into the skin at bedtime. 04/18/17   Nicholes Mango, MD  magnesium oxide (MAG-OX) 400 MG tablet Take 400 mg by mouth daily.    [provider]  Melatonin 1 MG TABS Take 1 mg by mouth at bedtime.    [provider]  Multiple Vitamin (MULTIVITAMIN) tablet Take 1 tablet by mouth daily.    [provider]  ondansetron (ZOFRAN) 4 MG tablet Take 1 tablet (4 mg total) by mouth every 6 (six) hours as needed for nausea. 04/18/17   Gouru, Illene Silver, MD  oxyCODONE (OXY IR/ROXICODONE) 5 MG immediate release tablet Take 1 tablet (5 mg total) by mouth every 6 (six) hours as needed for moderate pain. 04/18/17   Gouru, Illene Silver, MD  pantoprazole (PROTONIX) 40 MG tablet Take 40 mg by mouth daily.    [provider]  Rivaroxaban (XARELTO) 15 MG TABS tablet Take 15 mg by mouth daily.    [provider]  rOPINIRole (REQUIP) 0.5 MG tablet Take 0.5 mg by mouth 2 (two) times daily.    [provider]  spironolactone (ALDACTONE) 25 MG tablet Take 25 mg by mouth daily.     [provider]  sulfamethoxazole-trimethoprim (BACTRIM DS,SEPTRA DS) 800-160 MG tablet Take 1 tablet by mouth every 12 (twelve) hours. 04/18/17   Nicholes Mango, MD  vitamin B-12 (CYANOCOBALAMIN) 1000 MCG tablet Take 1,000 mcg by mouth daily.    [provider]    vitamin C (ASCORBIC ACID) 500 MG tablet Take 500 mg by mouth daily.    [provider]  Vitamin D, Ergocalciferol, (DRISDOL) 50000 UNITS CAPS capsule Take 50,000 Units by mouth every 7 (seven) days. Pt takes on Thursday.    [provider]    Allergies Ceftriaxone; Cefuroxime; Cephalosporins; Codeine; Epinephrine; Morphine and related; Buprenorphine hcl; Ciprofloxacin; Latex; Lidocaine; and Procaine  Family History  Problem Relation Age of Onset  . CVA Mother        Congestive Heart Failure, heart attack, hypertension, stroke  . Diabetes Mellitus II Sister   . Heart disease Father        Congestive  Heart Failure, heart attack, hypertension    Social History Social History   Tobacco Use  . Smoking status: Never Smoker  . Smokeless tobacco: Never Used  Substance Use Topics  . Alcohol use: No    Alcohol/week: 0.0 oz  . Drug use: No    Review of Systems  Constitutional: No fever/chills Eyes: No visual changes. ENT: No sore throat. Cardiovascular: Denies chest pain. Respiratory: Denies shortness of breath. Gastrointestinal: No abdominal pain.  No nausea, no vomiting.  No diarrhea.  No constipation. Genitourinary: Negative for dysuria. Musculoskeletal: Negative for back pain. Skin: Negative for rash. Neurological: Negative for headaches, focal weakness or numbness.   ____________________________________________   PHYSICAL EXAM:  VITAL SIGNS: ED Triage Vitals [05/08/17 2246]  Enc Vitals Group     BP (!) 120/44     Pulse Rate 73     Resp 18     Temp 97.9 F (36.6 C)     Temp Source Oral     SpO2 95 %     Weight 174 lb (78.9 kg)     Height 5\' 7"  (1.702 m)     Head Circumference      Peak Flow      Pain Score 0     Pain Loc      Pain Edu?      Excl. in [place]?     Constitutional: Alert and oriented. Well appearing and in no acute distress. Eyes: Conjunctivae are normal. PERRL. EOMI. Head: Atraumatic. Nose: No  congestion/rhinnorhea. Mouth/Throat: Mucous membranes are moist.  Oropharynx non-erythematous. Cardiovascular: Normal rate, regular rhythm. Grossly normal heart sounds.  Good peripheral circulation. Respiratory: Normal respiratory effort.  No retractions. Lungs CTAB. Gastrointestinal: Soft and nontender. No distention. No abdominal bruits. No CVA tenderness. Musculoskeletal: Trace lower extremity edema Neurologic:  Normal speech and language.  Skin: Bruising to anterior abdominal wall, location where injection placed this evening with no palpable foreign body under the skin, no bruising, no bleeding, no tenderness to palpation, no swelling.[place] Kitchen  Psychiatric: Mood and affect are normal.   ____________________________________________   LABS (all labs ordered are listed, but only abnormal results are displayed)  Labs Reviewed - No data to display ____________________________________________  EKG  none ____________________________________________  RADIOLOGY  Dg Abdomen 1 View  Result Date: 05/08/2017 CLINICAL DATA:  Question of retained insulin needle at the lower abdomen after injection. EXAM: ABDOMEN - 1 VIEW COMPARISON:  CT of the abdomen and pelvis from 11/11/2014 FINDINGS: No radiopaque foreign bodies are seen. There is no evidence of a retained needle. Thoracolumbar spinal fusion hardware is again noted. The visualized bowel gas pattern is grossly unremarkable. A moderate amount of stool seen within the colon. No acute osseous abnormalities are identified. IMPRESSION: No radiopaque foreign bodies seen. No evidence of a retained needle. Electronically Signed   By: Garald Balding M.D.   On: 05/08/2017 23:34    ____________________________________________   PROCEDURES  Procedure(s) performed: None  Procedures  Critical Care performed: No  ____________________________________________   INITIAL IMPRESSION / ASSESSMENT AND PLAN / ED COURSE  As part of my medical decision  making, I reviewed the following data within the electronic MEDICAL RECORD NUMBER Notes from prior ED visits and [place] Controlled Substance Database   This is a 83 year old female who comes into the hospital today with a concern for retained insulin needle under her skin.  We did send the patient for a KUB looking for this foreign body and nothing was visualized on the KUB.  I  also did palpate the area and did not feel any foreign body under her skin.  The staff does report that it is always possible it could have fallen off.  I did encourage the patient and staff that she had a needle be retained it will work its way out of her body and she can always follow-up but as there is nothing visualized or palpated I do feel it is appropriate to discharge the patient back to her nursing home.      ____________________________________________   FINAL CLINICAL IMPRESSION(S) / ED DIAGNOSES  Final diagnoses:  Retained foreign body in soft tissue     ED Discharge Orders    None       Note:  This document was prepared using Dragon voice recognition software and may include unintentional dictation errors.    Loney Hering, MD 05/09/17 [phone]

## 2017-05-09 NOTE — Discharge Instructions (Signed)
Please follow up with your primary care physician.

## 2017-05-09 NOTE — ED Notes (Signed)
Caregiver states she gave pt an insulin shot at 2200 tonight.  After giving the injection in the the abdomen, pt lost the needle and was concerned it was in the pt's abdomen.   Pt has no abd pain.  No bleeding noted to abd.  Pt alert.

## 2017-07-15 ENCOUNTER — Other Ambulatory Visit: Payer: Self-pay

## 2017-07-15 ENCOUNTER — Emergency Department: Payer: Medicare Other

## 2017-07-15 ENCOUNTER — Encounter: Payer: Self-pay | Admitting: Emergency Medicine

## 2017-07-15 ENCOUNTER — Inpatient Hospital Stay
Admission: EM | Admit: 2017-07-15 | Discharge: 2017-07-19 | DRG: 871 | Disposition: A | Discharge status: Home Health | Payer: Medicare Other | Attending: Internal Medicine | Admitting: Internal Medicine

## 2017-07-15 DIAGNOSIS — Z66 Do not resuscitate: Secondary | ICD-10-CM | POA: Diagnosis present

## 2017-07-15 DIAGNOSIS — Z7951 Long term (current) use of inhaled steroids: Secondary | ICD-10-CM | POA: Diagnosis not present

## 2017-07-15 DIAGNOSIS — M549 Dorsalgia, unspecified: Secondary | ICD-10-CM | POA: Diagnosis present

## 2017-07-15 DIAGNOSIS — Y92009 Unspecified place in unspecified non-institutional (private) residence as the place of occurrence of the external cause: Secondary | ICD-10-CM

## 2017-07-15 DIAGNOSIS — R531 Weakness: Secondary | ICD-10-CM

## 2017-07-15 DIAGNOSIS — G894 Chronic pain syndrome: Secondary | ICD-10-CM | POA: Diagnosis present

## 2017-07-15 DIAGNOSIS — R197 Diarrhea, unspecified: Secondary | ICD-10-CM | POA: Diagnosis present

## 2017-07-15 DIAGNOSIS — B951 Streptococcus, group B, as the cause of diseases classified elsewhere: Secondary | ICD-10-CM | POA: Diagnosis present

## 2017-07-15 DIAGNOSIS — Z7902 Long term (current) use of antithrombotics/antiplatelets: Secondary | ICD-10-CM

## 2017-07-15 DIAGNOSIS — Z79899 Other long term (current) drug therapy: Secondary | ICD-10-CM

## 2017-07-15 DIAGNOSIS — I5043 Acute on chronic combined systolic (congestive) and diastolic (congestive) heart failure: Secondary | ICD-10-CM | POA: Diagnosis present

## 2017-07-15 DIAGNOSIS — Z91040 Latex allergy status: Secondary | ICD-10-CM

## 2017-07-15 DIAGNOSIS — A419 Sepsis, unspecified organism: Secondary | ICD-10-CM | POA: Diagnosis present

## 2017-07-15 DIAGNOSIS — R0602 Shortness of breath: Secondary | ICD-10-CM | POA: Diagnosis present

## 2017-07-15 DIAGNOSIS — I2510 Atherosclerotic heart disease of native coronary artery without angina pectoris: Secondary | ICD-10-CM | POA: Diagnosis present

## 2017-07-15 DIAGNOSIS — I482 Chronic atrial fibrillation: Secondary | ICD-10-CM | POA: Diagnosis present

## 2017-07-15 DIAGNOSIS — Z7982 Long term (current) use of aspirin: Secondary | ICD-10-CM

## 2017-07-15 DIAGNOSIS — Z794 Long term (current) use of insulin: Secondary | ICD-10-CM | POA: Diagnosis not present

## 2017-07-15 DIAGNOSIS — E1151 Type 2 diabetes mellitus with diabetic peripheral angiopathy without gangrene: Secondary | ICD-10-CM | POA: Diagnosis present

## 2017-07-15 DIAGNOSIS — I110 Hypertensive heart disease with heart failure: Secondary | ICD-10-CM | POA: Diagnosis present

## 2017-07-15 DIAGNOSIS — Z955 Presence of coronary angioplasty implant and graft: Secondary | ICD-10-CM | POA: Diagnosis not present

## 2017-07-15 DIAGNOSIS — J810 Acute pulmonary edema: Secondary | ICD-10-CM

## 2017-07-15 DIAGNOSIS — R591 Generalized enlarged lymph nodes: Secondary | ICD-10-CM | POA: Diagnosis present

## 2017-07-15 DIAGNOSIS — W1830XA Fall on same level, unspecified, initial encounter: Secondary | ICD-10-CM | POA: Diagnosis present

## 2017-07-15 DIAGNOSIS — Z888 Allergy status to other drugs, medicaments and biological substances status: Secondary | ICD-10-CM

## 2017-07-15 DIAGNOSIS — J9621 Acute and chronic respiratory failure with hypoxia: Secondary | ICD-10-CM | POA: Diagnosis present

## 2017-07-15 DIAGNOSIS — Z85828 Personal history of other malignant neoplasm of skin: Secondary | ICD-10-CM

## 2017-07-15 DIAGNOSIS — N390 Urinary tract infection, site not specified: Secondary | ICD-10-CM

## 2017-07-15 DIAGNOSIS — Z8249 Family history of ischemic heart disease and other diseases of the circulatory system: Secondary | ICD-10-CM

## 2017-07-15 DIAGNOSIS — Z883 Allergy status to other anti-infective agents status: Secondary | ICD-10-CM | POA: Diagnosis not present

## 2017-07-15 DIAGNOSIS — Z885 Allergy status to narcotic agent status: Secondary | ICD-10-CM

## 2017-07-15 DIAGNOSIS — J449 Chronic obstructive pulmonary disease, unspecified: Secondary | ICD-10-CM | POA: Diagnosis present

## 2017-07-15 LAB — COMPREHENSIVE METABOLIC PANEL
ALBUMIN: 3.6 g/dL (ref 3.5–5.0)
ALK PHOS: 63 U/L (ref 38–126)
ALT: 16 U/L (ref 14–54)
AST: 30 U/L (ref 15–41)
Anion gap: 7 (ref 5–15)
BILIRUBIN TOTAL: 1.3 mg/dL — AB (ref 0.3–1.2)
BUN: 32 mg/dL — AB (ref 6–20)
CALCIUM: 8.7 mg/dL — AB (ref 8.9–10.3)
CO2: 28 mmol/L (ref 22–32)
Chloride: 100 mmol/L — ABNORMAL LOW (ref 101–111)
Creatinine, Ser: 1.19 mg/dL — ABNORMAL HIGH (ref 0.44–1.00)
GFR calc Af Amer: 45 mL/min — ABNORMAL LOW (ref >60)
GFR calc non Af Amer: 39 mL/min — ABNORMAL LOW (ref >60)
GLUCOSE: 265 mg/dL — AB (ref 65–99)
Potassium: 4.3 mmol/L (ref 3.5–5.1)
Sodium: 135 mmol/L (ref 135–145)
TOTAL PROTEIN: 6.7 g/dL (ref 6.5–8.1)

## 2017-07-15 LAB — CBC WITH DIFFERENTIAL/PLATELET
BASOS ABS: 0.1 10*3/uL (ref 0–0.1)
Basophils Relative: 0 %
Eosinophils Absolute: 0 10*3/uL (ref 0–0.7)
Eosinophils Relative: 0 %
HCT: 33 % — ABNORMAL LOW (ref 35.0–47.0)
Hemoglobin: 10.9 g/dL — ABNORMAL LOW (ref 12.0–16.0)
LYMPHS PCT: 4 %
Lymphs Abs: 0.6 10*3/uL — ABNORMAL LOW (ref 1.0–3.6)
MCH: 30.9 pg (ref 26.0–34.0)
MCHC: 33 g/dL (ref 32.0–36.0)
MCV: 93.6 fL (ref 80.0–100.0)
MONO ABS: 1.1 10*3/uL — AB (ref 0.2–0.9)
Monocytes Relative: 7 %
Neutro Abs: 13.4 10*3/uL — ABNORMAL HIGH (ref 1.4–6.5)
Neutrophils Relative %: 89 %
PLATELETS: 106 10*3/uL — AB (ref 150–440)
RBC: 3.52 MIL/uL — ABNORMAL LOW (ref 3.80–5.20)
RDW: 15.6 % — AB (ref 11.5–14.5)
WBC: 15.1 10*3/uL — ABNORMAL HIGH (ref 3.6–11.0)

## 2017-07-15 LAB — URINALYSIS, COMPLETE (UACMP) WITH MICROSCOPIC
BACTERIA UA: NONE SEEN
Bilirubin Urine: NEGATIVE
Glucose, UA: NEGATIVE mg/dL
Hgb urine dipstick: NEGATIVE
KETONES UR: NEGATIVE mg/dL
NITRITE: NEGATIVE
PH: 5 (ref 5.0–8.0)
PROTEIN: 30 mg/dL — AB
Specific Gravity, Urine: 1.016 (ref 1.005–1.030)

## 2017-07-15 LAB — GLUCOSE, CAPILLARY: Glucose-Capillary: 233 mg/dL — ABNORMAL HIGH (ref 65–99)

## 2017-07-15 LAB — LACTIC ACID, PLASMA
Lactic Acid, Venous: 1.7 mmol/L (ref 0.5–1.9)
Lactic Acid, Venous: 3.1 mmol/L (ref 0.5–1.9)

## 2017-07-15 LAB — TSH: TSH: 1.061 u[IU]/mL (ref 0.350–4.500)

## 2017-07-15 LAB — TROPONIN I: Troponin I: 0.06 ng/mL (ref <0.03)

## 2017-07-15 LAB — BRAIN NATRIURETIC PEPTIDE: B Natriuretic Peptide: 880 pg/mL — ABNORMAL HIGH (ref 0.0–100.0)

## 2017-07-15 MED ORDER — FUROSEMIDE 10 MG/ML IJ SOLN
40.0000 mg | Freq: Once | INTRAMUSCULAR | Status: AC
  Administered 2017-07-15: 40 mg via INTRAVENOUS
  Filled 2017-07-15: qty 4

## 2017-07-15 MED ORDER — MAGNESIUM OXIDE 400 (241.3 MG) MG PO TABS
400.0000 mg | ORAL_TABLET | Freq: Every day | ORAL | Status: DC
  Administered 2017-07-15 – 2017-07-19 (×5): 400 mg via ORAL
  Filled 2017-07-15 (×5): qty 1

## 2017-07-15 MED ORDER — OXYCODONE HCL 5 MG PO TABS
5.0000 mg | ORAL_TABLET | Freq: Four times a day (QID) | ORAL | Status: DC | PRN
  Administered 2017-07-15 – 2017-07-18 (×2): 5 mg via ORAL
  Filled 2017-07-15 (×2): qty 1

## 2017-07-15 MED ORDER — ACETAMINOPHEN 650 MG RE SUPP: 650.0000 mg | Freq: Four times a day (QID) | RECTAL | Status: DC | PRN

## 2017-07-15 MED ORDER — VITAMIN C 500 MG PO TABS
500.0000 mg | ORAL_TABLET | Freq: Every day | ORAL | Status: DC
  Administered 2017-07-15 – 2017-07-19 (×5): 500 mg via ORAL
  Filled 2017-07-15 (×5): qty 1

## 2017-07-15 MED ORDER — SODIUM CHLORIDE 0.9% FLUSH: 3.0000 mL | INTRAVENOUS | Status: DC | PRN

## 2017-07-15 MED ORDER — MELATONIN 5 MG PO TABS
2.5000 mg | ORAL_TABLET | Freq: Every day | ORAL | Status: DC
  Administered 2017-07-15 – 2017-07-18 (×4): 2.5 mg via ORAL
  Filled 2017-07-15 (×5): qty 0.5

## 2017-07-15 MED ORDER — SODIUM CHLORIDE 0.9 % IV SOLN: 250.0000 mL | INTRAVENOUS | Status: DC | PRN

## 2017-07-15 MED ORDER — CARVEDILOL 3.125 MG PO TABS
6.2500 mg | ORAL_TABLET | Freq: Two times a day (BID) | ORAL | Status: DC
  Administered 2017-07-16 – 2017-07-19 (×7): 6.25 mg via ORAL
  Filled 2017-07-15 (×7): qty 2

## 2017-07-15 MED ORDER — BUDESONIDE 0.5 MG/2ML IN SUSP
0.5000 mg | Freq: Two times a day (BID) | RESPIRATORY_TRACT | Status: DC
  Administered 2017-07-16 – 2017-07-19 (×7): 0.5 mg via RESPIRATORY_TRACT
  Filled 2017-07-15 (×7): qty 2

## 2017-07-15 MED ORDER — FUROSEMIDE 10 MG/ML IJ SOLN
20.0000 mg | Freq: Two times a day (BID) | INTRAMUSCULAR | Status: AC
  Administered 2017-07-15 – 2017-07-16 (×2): 20 mg via INTRAVENOUS
  Filled 2017-07-15 (×2): qty 2

## 2017-07-15 MED ORDER — SPIRONOLACTONE 25 MG PO TABS
25.0000 mg | ORAL_TABLET | Freq: Every day | ORAL | Status: DC
  Administered 2017-07-16 – 2017-07-19 (×4): 25 mg via ORAL
  Filled 2017-07-15 (×4): qty 1

## 2017-07-15 MED ORDER — LACTATED RINGERS IV SOLN
INTRAVENOUS | Status: AC
  Administered 2017-07-15: 20:00:00 via INTRAVENOUS

## 2017-07-15 MED ORDER — BISACODYL 10 MG RE SUPP: 10.0000 mg | Freq: Every day | RECTAL | Status: DC | PRN

## 2017-07-15 MED ORDER — BACLOFEN 10 MG PO TABS
10.0000 mg | ORAL_TABLET | Freq: Three times a day (TID) | ORAL | Status: DC
  Administered 2017-07-15 – 2017-07-19 (×11): 10 mg via ORAL
  Filled 2017-07-15 (×13): qty 1

## 2017-07-15 MED ORDER — FUROSEMIDE 20 MG PO TABS
20.0000 mg | ORAL_TABLET | Freq: Every day | ORAL | Status: DC
  Administered 2017-07-17: 10:00:00 20 mg via ORAL
  Filled 2017-07-15: qty 1

## 2017-07-15 MED ORDER — IPRATROPIUM BROMIDE 0.02 % IN SOLN: 0.5000 mg | Freq: Four times a day (QID) | RESPIRATORY_TRACT | Status: DC | PRN

## 2017-07-15 MED ORDER — VITAMIN B-12 1000 MCG PO TABS
1000.0000 ug | ORAL_TABLET | Freq: Every day | ORAL | Status: DC
  Administered 2017-07-16 – 2017-07-19 (×4): 1000 ug via ORAL
  Filled 2017-07-15 (×4): qty 1

## 2017-07-15 MED ORDER — ESCITALOPRAM OXALATE 10 MG PO TABS
10.0000 mg | ORAL_TABLET | Freq: Every day | ORAL | Status: DC
  Administered 2017-07-16 – 2017-07-19 (×4): 10 mg via ORAL
  Filled 2017-07-15 (×4): qty 1

## 2017-07-15 MED ORDER — PANTOPRAZOLE SODIUM 40 MG PO TBEC
40.0000 mg | DELAYED_RELEASE_TABLET | Freq: Every day | ORAL | Status: DC
  Administered 2017-07-16 – 2017-07-19 (×4): 40 mg via ORAL
  Filled 2017-07-15 (×4): qty 1

## 2017-07-15 MED ORDER — GABAPENTIN 100 MG PO CAPS
100.0000 mg | ORAL_CAPSULE | Freq: Every day | ORAL | Status: DC
  Administered 2017-07-15 – 2017-07-18 (×4): 100 mg via ORAL
  Filled 2017-07-15 (×5): qty 1

## 2017-07-15 MED ORDER — ALPRAZOLAM 0.5 MG PO TABS: 0.5000 mg | ORAL_TABLET | Freq: Three times a day (TID) | ORAL | Status: DC | PRN

## 2017-07-15 MED ORDER — LEVOFLOXACIN IN D5W 500 MG/100ML IV SOLN: 500.0000 mg | INTRAVENOUS | Status: DC

## 2017-07-15 MED ORDER — SENNA 8.6 MG PO TABS
1.0000 | ORAL_TABLET | Freq: Two times a day (BID) | ORAL | Status: DC
  Administered 2017-07-15 – 2017-07-19 (×7): 8.6 mg via ORAL
  Filled 2017-07-15 (×7): qty 1

## 2017-07-15 MED ORDER — SODIUM CHLORIDE 0.9% FLUSH
3.0000 mL | Freq: Two times a day (BID) | INTRAVENOUS | Status: DC
  Administered 2017-07-16 – 2017-07-19 (×7): 3 mL via INTRAVENOUS

## 2017-07-15 MED ORDER — GLUCERNA PO LIQD
237.0000 mL | Freq: Two times a day (BID) | ORAL | Status: DC
  Administered 2017-07-16 – 2017-07-19 (×6): 237 mL via ORAL
  Filled 2017-07-15 (×8): qty 250

## 2017-07-15 MED ORDER — ALBUTEROL SULFATE (2.5 MG/3ML) 0.083% IN NEBU: 2.5000 mg | INHALATION_SOLUTION | Freq: Four times a day (QID) | RESPIRATORY_TRACT | Status: DC | PRN

## 2017-07-15 MED ORDER — RIVAROXABAN 15 MG PO TABS
15.0000 mg | ORAL_TABLET | Freq: Every day | ORAL | Status: DC
  Administered 2017-07-16 – 2017-07-19 (×4): 15 mg via ORAL
  Filled 2017-07-15 (×5): qty 1

## 2017-07-15 MED ORDER — ACETAMINOPHEN 325 MG PO TABS
650.0000 mg | ORAL_TABLET | Freq: Four times a day (QID) | ORAL | Status: DC | PRN
  Administered 2017-07-15: 650 mg via ORAL
  Filled 2017-07-15: qty 2

## 2017-07-15 MED ORDER — POLYETHYLENE GLYCOL 3350 17 G PO PACK: 17.0000 g | PACK | Freq: Every day | ORAL | Status: DC | PRN

## 2017-07-15 MED ORDER — FERROUS SULFATE 325 (65 FE) MG PO TABS
325.0000 mg | ORAL_TABLET | Freq: Every day | ORAL | Status: DC
  Administered 2017-07-16 – 2017-07-19 (×4): 325 mg via ORAL
  Filled 2017-07-15 (×4): qty 1

## 2017-07-15 MED ORDER — VITAMIN D 1000 UNITS PO TABS
1000.0000 [IU] | ORAL_TABLET | Freq: Every day | ORAL | Status: DC
  Administered 2017-07-15 – 2017-07-19 (×5): 1000 [IU] via ORAL
  Filled 2017-07-15 (×5): qty 1

## 2017-07-15 MED ORDER — DOCUSATE SODIUM 100 MG PO CAPS
100.0000 mg | ORAL_CAPSULE | Freq: Two times a day (BID) | ORAL | Status: DC
  Administered 2017-07-15 – 2017-07-19 (×8): 100 mg via ORAL
  Filled 2017-07-15 (×8): qty 1

## 2017-07-15 MED ORDER — ONDANSETRON HCL 4 MG PO TABS: 4.0000 mg | ORAL_TABLET | Freq: Four times a day (QID) | ORAL | Status: DC | PRN

## 2017-07-15 MED ORDER — LEVOFLOXACIN IN D5W 750 MG/150ML IV SOLN
750.0000 mg | Freq: Once | INTRAVENOUS | Status: AC
  Administered 2017-07-15: 750 mg via INTRAVENOUS
  Filled 2017-07-15: qty 150

## 2017-07-15 MED ORDER — INSULIN ASPART 100 UNIT/ML ~~LOC~~ SOLN
0.0000 [IU] | Freq: Every day | SUBCUTANEOUS | Status: DC
  Administered 2017-07-15 – 2017-07-18 (×3): 2 [IU] via SUBCUTANEOUS
  Filled 2017-07-15 (×3): qty 1

## 2017-07-15 MED ORDER — HYDRALAZINE HCL 20 MG/ML IJ SOLN: 10.0000 mg | INTRAMUSCULAR | Status: DC | PRN

## 2017-07-15 MED ORDER — ROPINIROLE HCL 1 MG PO TABS
0.5000 mg | ORAL_TABLET | Freq: Two times a day (BID) | ORAL | Status: DC
  Administered 2017-07-15 – 2017-07-19 (×8): 0.5 mg via ORAL
  Filled 2017-07-15 (×8): qty 1

## 2017-07-15 MED ORDER — ASPIRIN EC 81 MG PO TBEC
81.0000 mg | DELAYED_RELEASE_TABLET | Freq: Every day | ORAL | Status: DC
  Administered 2017-07-16 – 2017-07-19 (×4): 81 mg via ORAL
  Filled 2017-07-15 (×4): qty 1

## 2017-07-15 MED ORDER — SODIUM CHLORIDE 0.9% FLUSH
3.0000 mL | Freq: Two times a day (BID) | INTRAVENOUS | Status: DC
  Administered 2017-07-16 – 2017-07-17 (×3): 3 mL via INTRAVENOUS

## 2017-07-15 MED ORDER — ONDANSETRON HCL 4 MG/2ML IJ SOLN: 4.0000 mg | Freq: Four times a day (QID) | INTRAMUSCULAR | Status: DC | PRN

## 2017-07-15 MED ORDER — LEVALBUTEROL HCL 1.25 MG/0.5ML IN NEBU: 1.2500 mg | INHALATION_SOLUTION | Freq: Four times a day (QID) | RESPIRATORY_TRACT | Status: DC | PRN

## 2017-07-15 MED ORDER — INSULIN ASPART 100 UNIT/ML ~~LOC~~ SOLN
0.0000 [IU] | Freq: Three times a day (TID) | SUBCUTANEOUS | Status: DC
  Administered 2017-07-16: 3 [IU] via SUBCUTANEOUS
  Administered 2017-07-17: 12:00:00 5 [IU] via SUBCUTANEOUS
  Administered 2017-07-17 – 2017-07-18 (×2): 2 [IU] via SUBCUTANEOUS
  Administered 2017-07-18 – 2017-07-19 (×3): 3 [IU] via SUBCUTANEOUS
  Administered 2017-07-19: 8 [IU] via SUBCUTANEOUS
  Filled 2017-07-15 (×8): qty 1

## 2017-07-15 MED ORDER — ADULT MULTIVITAMIN W/MINERALS CH
1.0000 | ORAL_TABLET | Freq: Every day | ORAL | Status: DC
  Administered 2017-07-15 – 2017-07-19 (×5): 1 via ORAL
  Filled 2017-07-15 (×5): qty 1

## 2017-07-15 NOTE — ED Provider Notes (Signed)
[facility] Emergency Department Provider Note   ____________________________________________    I have reviewed the triage vital signs and the nursing notes.   HISTORY  Chief Complaint Weakness    HPI [name] is a 84 y.o. female who presents with complaints of weakness and shortness of breath.  Patient reports she fell yesterday and was unable to get up because she felt diffusely weak.  She tried for an hour to stand up but was unable to, eventually she called 911 and they helped her get into bed.  Today she continues to feel weak all over and has developed mild shortness of breath with an intermittent cough as well.  She does have a history of COPD and CHF.  She denies nausea or vomiting.  Reportedly had a temperature with EMS.  No dysuria   Past Medical History:  Diagnosis Date  . Cancer ([place])    skin ca  . CHF (congestive heart failure) ([place])   . Chronic back pain   . COPD (chronic obstructive pulmonary disease) ([place])   . Diabetes mellitus without complication ([place])   . Hypertension   . Poor perfusion of leg     Patient Active Problem List   Diagnosis Date Noted  . Sepsis ([place]) 04/14/2017  . CAP (community acquired pneumonia) 04/14/2017  . COPD (chronic obstructive pulmonary disease) ([place]) 12/07/2016  . UTI (urinary tract infection) 12/07/2016  . Ventral hernia without obstruction or gangrene   . Pneumonia 09/07/2015  . Hyponatremia 09/07/2015  . Acute respiratory failure ([place]) 09/01/2015  . Atrial fibrillation ([place]) 04/10/2015  . Essential hypertension 04/10/2015  . Diabetes ([place]) 04/10/2015  . Back pain 04/10/2015  . Acute on chronic combined systolic and diastolic CHF (congestive heart failure) ([place]) 03/21/2015  . Pulmonary nodule 11/10/2014    Past Surgical History:  Procedure Laterality Date  . ABDOMINAL HYSTERECTOMY    . BACK SURGERY     x3  . CORONARY STENT PLACEMENT     unknown location per pt  . FOOT SURGERY     . LEG SURGERY    . PACEMAKER PLACEMENT    . TONSILLECTOMY      Prior to Admission medications   Medication Sig Start Date End Date Taking? Authorizing Provider  acetaminophen (TYLENOL) 325 MG tablet Take 2 tablets (650 mg total) by mouth every 6 (six) hours as needed for mild pain (or Fever >/= 101). 04/18/17   Gouru, Illene Silver, MD  albuterol (PROVENTIL HFA;VENTOLIN HFA) 108 (90 Base) MCG/ACT inhaler Inhale 1-2 puffs into the lungs every 6 (six) hours as needed for wheezing or shortness of breath. Patient not taking: Reported on 04/14/2017 09/22/16   Harrie Foreman, MD  ALPRAZolam Duanne Moron) 0.5 MG tablet Take 1 tablet (0.5 mg total) by mouth every 8 (eight) hours as needed for anxiety. 04/18/17   Nicholes Mango, MD  aspirin EC 81 MG tablet Take 81 mg by mouth daily.    [provider]  baclofen (LIORESAL) 10 MG tablet Take 10 mg by mouth 3 (three) times daily.    [provider]  budesonide-formoterol (SYMBICORT) 160-4.5 MCG/ACT inhaler Inhale 2 puffs into the lungs 2 (two) times daily. Patient not taking: Reported on 04/14/2017 09/03/15   Gladstone Lighter, MD  carvedilol (COREG) 6.25 MG tablet Take 6.25 mg by mouth 2 (two) times daily with a meal.     [provider]  cholecalciferol (VITAMIN D) 1000 units tablet Take 1,000 Units by mouth daily.    [provider]  ciprofloxacin (CIPRO) 500 MG tablet Take 1 tablet (500 mg total) by mouth 2 (two) times daily. 04/18/17   Nicholes Mango, MD  docusate sodium (COLACE) 100 MG capsule Take 100 mg by mouth 2 (two) times daily.     [provider]  escitalopram (LEXAPRO) 10 MG tablet Take 10 mg by mouth daily.    [provider]  ferrous sulfate 325 (65 FE) MG tablet Take 325 mg by mouth daily with breakfast.    [provider]  furosemide (LASIX) 20 MG tablet Take 1 tablet (20 mg total) by mouth daily. Pt alternates with the 80mg  tablet. 04/18/17   Nicholes Mango, MD  gabapentin (NEURONTIN) 100  MG capsule Take 100-200 mg by mouth at bedtime.    [provider]  GLUCERNA (GLUCERNA) LIQD Take 237 mLs by mouth 2 (two) times daily between meals. 04/18/17   Gouru, Illene Silver, MD  insulin aspart (NOVOLOG) 100 UNIT/ML injection Inject 0-9 Units into the skin 3 (three) times daily with meals. 04/18/17   Gouru, Illene Silver, MD  insulin aspart (NOVOLOG) 100 UNIT/ML injection Inject 0-5 Units into the skin at bedtime. 04/18/17   Nicholes Mango, MD  magnesium oxide (MAG-OX) 400 MG tablet Take 400 mg by mouth daily.    [provider]  Melatonin 1 MG TABS Take 1 mg by mouth at bedtime.    [provider]  Multiple Vitamin (MULTIVITAMIN) tablet Take 1 tablet by mouth daily.    [provider]  ondansetron (ZOFRAN) 4 MG tablet Take 1 tablet (4 mg total) by mouth every 6 (six) hours as needed for nausea. 04/18/17   Gouru, Illene Silver, MD  oxyCODONE (OXY IR/ROXICODONE) 5 MG immediate release tablet Take 1 tablet (5 mg total) by mouth every 6 (six) hours as needed for moderate pain. 04/18/17   Gouru, Illene Silver, MD  pantoprazole (PROTONIX) 40 MG tablet Take 40 mg by mouth daily.    [provider]  Rivaroxaban (XARELTO) 15 MG TABS tablet Take 15 mg by mouth daily.    [provider]  rOPINIRole (REQUIP) 0.5 MG tablet Take 0.5 mg by mouth 2 (two) times daily.    [provider]  spironolactone (ALDACTONE) 25 MG tablet Take 25 mg by mouth daily.     [provider]  sulfamethoxazole-trimethoprim (BACTRIM DS,SEPTRA DS) 800-160 MG tablet Take 1 tablet by mouth every 12 (twelve) hours. 04/18/17   Nicholes Mango, MD  vitamin B-12 (CYANOCOBALAMIN) 1000 MCG tablet Take 1,000 mcg by mouth daily.    [provider]  vitamin C (ASCORBIC ACID) 500 MG tablet Take 500 mg by mouth daily.    [provider]  Vitamin D, Ergocalciferol, (DRISDOL) 50000 UNITS CAPS capsule Take 50,000 Units by mouth every 7 (seven) days. Pt takes on Thursday.    [provider]     Allergies Ceftriaxone; Cefuroxime; Cephalosporins; Codeine; Epinephrine; Morphine and related; Buprenorphine hcl; Ciprofloxacin; Latex; Lidocaine; and Procaine  Family History  Problem Relation Age of Onset  . CVA Mother        Congestive Heart Failure, heart attack, hypertension, stroke  . Diabetes Mellitus II Sister   . Heart disease Father        Congestive Heart Failure, heart attack, hypertension    Social History Social History   Tobacco Use  . Smoking status: Never Smoker  . Smokeless tobacco: Never Used  Substance Use Topics  . Alcohol use: No    Alcohol/week: 0.0 oz  . Drug use: No  Review of Systems  Constitutional: As above Eyes: No visual changes.  ENT: No sore throat. Cardiovascular: Denies chest pain. Respiratory: As above Gastrointestinal: No abdominal pain.  No nausea, no vomiting.   Genitourinary: Negative for dysuria. Musculoskeletal: Negative for back pain. Skin: Negative for rash. Neurological: Negative for headaches    ____________________________________________   PHYSICAL EXAM:  VITAL SIGNS: ED Triage Vitals  Enc Vitals Group     BP      Pulse      Resp      Temp      Temp src      SpO2      Weight      Height      Head Circumference      Peak Flow      Pain Score      Pain Loc      Pain Edu?      Excl. in [place]?     Constitutional: Alert and oriented. No acute distress. Pleasant and interactive Eyes: Conjunctivae are normal.   Nose: No congestion/rhinnorhea. Mouth/Throat: Mucous membranes are moist.    Cardiovascular: Normal rate, regular rhythm.  Good peripheral circulation. Respiratory: Increased respiratory effort with tachypnea, no retractions, no wheezes Gastrointestinal: Soft and nontender. No distention.   Genitourinary: deferred Musculoskeletal:   Warm and well perfused, mild swelling and tenderness to the right knee diffusely as well as some swelling to the mid tibia area no erythema or  suggestion of infection Neurologic:  Normal speech and language. No gross focal neurologic deficits are appreciated.  Normal strength in all extremities, cranial nerves II through XII are normal Skin:  Skin is warm, dry and intact. No rash noted. Psychiatric: Mood and affect are normal. Speech and behavior are normal.  ____________________________________________   LABS (all labs ordered are listed, but only abnormal results are displayed)  Labs Reviewed  CBC WITH DIFFERENTIAL/PLATELET - Abnormal; Notable for the following components:      Result Value   WBC 15.1 (*)    RBC 3.52 (*)    Hemoglobin 10.9 (*)    HCT 33.0 (*)    RDW 15.6 (*)    Platelets 106 (*)    Neutro Abs 13.4 (*)    Lymphs Abs 0.6 (*)    Monocytes Absolute 1.1 (*)    All other components within normal limits  CULTURE, BLOOD (ROUTINE X 2)  CULTURE, BLOOD (ROUTINE X 2)  URINE CULTURE  LACTIC ACID, PLASMA  LACTIC ACID, PLASMA  COMPREHENSIVE METABOLIC PANEL  TROPONIN I  URINALYSIS, COMPLETE (UACMP) WITH MICROSCOPIC   ____________________________________________  EKG  ED ECG REPORT I, Lavonia Drafts, the attending physician, personally viewed and interpreted this ECG.  Date: 07/15/2017  Rate: 70 Rhythm: v-paced  QRS Axis: normal Intervals: normal ST/T Wave abnormalities: normal Narrative Interpretation: no evidence of acute ischemia  ____________________________________________  RADIOLOGY  cxr c/w pulm edema Knee/tib/fib, nad ____________________________________________   PROCEDURES  Procedure(s) performed: No  Procedures   Critical Care performed: No ____________________________________________   INITIAL IMPRESSION / ASSESSMENT AND PLAN / ED COURSE  Pertinent labs & imaging results that were available during my care of the patient were reviewed by me and considered in my medical decision making (see chart for details).  Patient presents with primary complaint of weakness.  EMS  reports fever on their evaluation, patient with mildly increased work of breathing.  Differential diagnosis includes infectious etiologies such as pneumonia or urinary tract infection, electrolyte abnormalities, deconditioning.  We will check labs, chest x-ray,  urine and monitor closely.  Imaging significant for pulmonary edema, likely the cause of her SOB. Elevated WBC but no evidence of PNA. Pending urinalysis.  Have asked Dr. Archie Balboa to follow up on labs/urine and likely admission    ____________________________________________   FINAL CLINICAL IMPRESSION(S) / ED DIAGNOSES  Final diagnoses:  Shortness of breath  Acute pulmonary edema ([place])        Note:  This document was prepared using Dragon voice recognition software and may include unintentional dictation errors.    Lavonia Drafts, MD 07/15/17 1505

## 2017-07-15 NOTE — H&P (Signed)
[facility] [name]    MR#:  [id number]  DATE OF BIRTH:  [birth year]  DATE OF ADMISSION:  07/15/2017  PRIMARY CARE PHYSICIAN: Tracie Harrier, MD   REQUESTING/REFERRING PHYSICIAN:   CHIEF COMPLAINT:   Chief Complaint  Patient presents with  . Shortness of Breath    HISTORY OF PRESENT ILLNESS: [name]  is a 84 y.o. female with a known history for CAD shortness of breath, and shortness of breath, had fallen yesterday-unable to get up, called EMS, patient transferred here for further evaluation/care, ER workup included for tachypnea, troponin 0.06,, troponin 0.06, white count 15,000, platelet count 106, hemoglobin 10.9-60, total bili 1.3, urinalysis suspicious suspicious for UTI, chest x-ray negative for coronary edema, right knee/lower extremity x-rays negative for fracture. Patient evaluated in emergency room, family at the bedside, patient complains of her chronic back pain only-no worse than previous, patient is now being admitted for acute urinary tract infection, acute on chronic systolic congestive heart failure exacerbation which is mild.   PAST MEDICAL HISTORY:   Past Medical History:  Diagnosis Date  . Cancer ([place])    skin ca  . CHF (congestive heart failure) ([place])   . Chronic back pain   . COPD (chronic obstructive pulmonary disease) ([place])   . Diabetes mellitus without complication ([place])   . Hypertension   . Poor perfusion of leg     PAST SURGICAL HISTORY:  Past Surgical History:  Procedure Laterality Date  . ABDOMINAL HYSTERECTOMY    . BACK SURGERY     x3  . CORONARY STENT PLACEMENT     unknown location per pt  . FOOT SURGERY    . LEG SURGERY    . PACEMAKER PLACEMENT    . TONSILLECTOMY      SOCIAL HISTORY:  Social History   Tobacco Use  . Smoking status: Never Smoker  . Smokeless tobacco: Never Used  Substance Use Topics  . Alcohol use: No    Alcohol/week: 0.0 oz    FAMILY HISTORY:   Family History  Problem Relation Age of Onset  . CVA Mother        Congestive Heart Failure, heart attack, hypertension, stroke  . Diabetes Mellitus II Sister   . Heart disease Father        Congestive Heart Failure, heart attack, hypertension    DRUG ALLERGIES:  Allergies  Allergen Reactions  . Ceftriaxone Swelling  . Cefuroxime Hives  . Cephalosporins Swelling and Other (See Comments)    Reaction:  Fainting/dry mouth   . Codeine Nausea And Vomiting  . Epinephrine Other (See Comments)    Reaction:  Fainting   . Morphine And Related Other (See Comments)    Reaction:  GI upset   . Buprenorphine Hcl Nausea And Vomiting  . Ciprofloxacin Itching, Swelling and Rash  . Latex Rash  . Lidocaine Rash  . Procaine Rash    REVIEW OF SYSTEMS:   CONSTITUTIONAL: No fever,+ fatigue or weakness.  EYES: No blurred or double vision.  EARS, NOSE, AND THROAT: No tinnitus or ear pain.  RESPIRATORY: No cough, +shortness of breath, no wheezing or hemoptysis.  CARDIOVASCULAR: No chest pain, orthopnea, edema.  GASTROINTESTINAL: No nausea, vomiting, diarrhea or abdominal pain.  GENITOURINARY: No dysuria, hematuria.  ENDOCRINE: No polyuria, nocturia,  HEMATOLOGY: No anemia, easy bruising or bleeding SKIN: No rash or lesion. MUSCULOSKELETAL: Chronic disease back pain/arthritis.   NEUROLOGIC: No tingling, numbness, weakness.  PSYCHIATRY: No anxiety or  depression.   MEDICATIONS AT HOME:  Prior to Admission medications   Medication Sig Start Date End Date Taking? Authorizing Provider  acetaminophen (TYLENOL) 325 MG tablet Take 2 tablets (650 mg total) by mouth every 6 (six) hours as needed for mild pain (or Fever >/= 101). 04/18/17   Gouru, Illene Silver, MD  ALPRAZolam Duanne [place]) 0.5 MG tablet Take 1 tablet (0.5 mg total) by mouth every 8 (eight) hours as needed for anxiety. 04/18/17   Nicholes Mango, MD  aspirin EC 81 MG tablet Take 81 mg by mouth daily.    [provider]  baclofen (LIORESAL) 10  MG tablet Take 10 mg by mouth 3 (three) times daily.    [provider]  carvedilol (COREG) 6.25 MG tablet Take 6.25 mg by mouth 2 (two) times daily with a meal.     [provider]  cholecalciferol (VITAMIN D) 1000 units tablet Take 1,000 Units by mouth daily.    [provider]  docusate sodium (COLACE) 100 MG capsule Take 100 mg by mouth 2 (two) times daily.     [provider]  escitalopram (LEXAPRO) 10 MG tablet Take 10 mg by mouth daily.    [provider]  ferrous sulfate 325 (65 FE) MG tablet Take 325 mg by mouth daily with breakfast.    [provider]  furosemide (LASIX) 20 MG tablet Take 1 tablet (20 mg total) by mouth daily. Pt alternates with the 80mg  tablet. 04/18/17   Nicholes Mango, MD  gabapentin (NEURONTIN) 100 MG capsule Take 100-200 mg by mouth at bedtime.    [provider]  GLUCERNA (GLUCERNA) LIQD Take 237 mLs by mouth 2 (two) times daily between meals. 04/18/17   Gouru, Illene Silver, MD  insulin aspart (NOVOLOG) 100 UNIT/ML injection Inject 0-9 Units into the skin 3 (three) times daily with meals. 04/18/17   Gouru, Illene Silver, MD  insulin aspart (NOVOLOG) 100 UNIT/ML injection Inject 0-5 Units into the skin at bedtime. 04/18/17   Nicholes Mango, MD  magnesium oxide (MAG-OX) 400 MG tablet Take 400 mg by mouth daily.    [provider]  Melatonin 1 MG TABS Take 1 mg by mouth at bedtime.    [provider]  Multiple Vitamin (MULTIVITAMIN) tablet Take 1 tablet by mouth daily.    [provider]  ondansetron (ZOFRAN) 4 MG tablet Take 1 tablet (4 mg total) by mouth every 6 (six) hours as needed for nausea. 04/18/17   Gouru, Illene Silver, MD  oxyCODONE (OXY IR/ROXICODONE) 5 MG immediate release tablet Take 1 tablet (5 mg total) by mouth every 6 (six) hours as needed for moderate pain. 04/18/17   Gouru, Illene Silver, MD  pantoprazole (PROTONIX) 40 MG tablet Take 40 mg by mouth daily.    [provider]  Rivaroxaban  (XARELTO) 15 MG TABS tablet Take 15 mg by mouth daily.    [provider]  rOPINIRole (REQUIP) 0.5 MG tablet Take 0.5 mg by mouth 2 (two) times daily.    [provider]  spironolactone (ALDACTONE) 25 MG tablet Take 25 mg by mouth daily.     [provider]  vitamin B-12 (CYANOCOBALAMIN) 1000 MCG tablet Take 1,000 mcg by mouth daily.    [provider]  vitamin C (ASCORBIC ACID) 500 MG tablet Take 500 mg by mouth daily.    [provider]  Vitamin D, Ergocalciferol, (DRISDOL) 50000 UNITS CAPS capsule Take 50,000 Units by mouth every 7 (seven) days. Pt takes on Thursday.    [provider]      PHYSICAL EXAMINATION:   VITAL SIGNS: Blood pressure (!) 135/56, pulse 67, temperature 98.9 F (37.2 C), temperature source Oral, resp. rate (!) 24, height 5\' 7"  (1.702 m), weight 79.4 kg (175 lb), SpO2 99 %.  GENERAL:  84 y.o.-year-old patient lying in the bed with no acute distress.  Frail appearance  EYES: Normal sclera. Extraocular muscles intact.  HEENT: Head atraumatic, normocephalic. Oropharynx and nasopharynx clear.  NECK:  Supple, no jugular venous distention. No thyroid enlargement, no tenderness.  LUNGS:  Diffuse Rales up to mid back bilaterally. No use of accessory muscles of respiration.  CARDIOVASCULAR: S1, S2 normal. No murmurs, rubs, or gallops.  ABDOMEN: Soft, nontender, nondistended. Bowel sounds present. No organomegaly or mass.  EXTREMITIES:  Bilateral mild lower extremity edema, cyanosis, or clubbing.  NEUROLOGIC: Cranial nerves II through XII are intact. MAES. Gait not checked.  PSYCHIATRIC: The patient is alert and oriented x 3.  SKIN: No obvious rash, lesion, or ulcer.   LABORATORY PANEL:   CBC Recent Labs  Lab 07/15/17 1357  WBC 15.1*  HGB 10.9*  HCT 33.0*  PLT 106*  MCV 93.6  MCH 30.9  MCHC 33.0  RDW 15.6*  LYMPHSABS 0.6*  MONOABS 1.1*  EOSABS 0.0  BASOSABS 0.1    ------------------------------------------------------------------------------------------------------------------  Chemistries  Recent Labs  Lab 07/15/17 1357  NA 135  K 4.3  CL 100*  CO2 28  GLUCOSE 265*  BUN 32*  CREATININE 1.19*  CALCIUM 8.7*  AST 30  ALT 16  ALKPHOS 63  BILITOT 1.3*   ------------------------------------------------------------------------------------------------------------------ estimated creatinine clearance is 34.1 mL/min (A) (by C-G formula based on SCr of 1.19 mg/dL (H)). ------------------------------------------------------------------------------------------------------------------ No results for input(s): TSH, T4TOTAL, T3FREE, THYROIDAB in the last 72 hours.  Invalid input(s): FREET3   Coagulation profile No results for input(s): INR, PROTIME in the last 168 hours. ------------------------------------------------------------------------------------------------------------------- No results for input(s): DDIMER in the last 72 hours. -------------------------------------------------------------------------------------------------------------------  Cardiac Enzymes Recent Labs  Lab 07/15/17 1357  TROPONINI 0.06*   ------------------------------------------------------------------------------------------------------------------ Invalid input(s): POCBNP  ---------------------------------------------------------------------------------------------------------------  Urinalysis    Component Value Date/Time   COLORURINE YELLOW (A) 07/15/2017 1357   APPEARANCEUR HAZY (A) 07/15/2017 1357   APPEARANCEUR Clear 08/19/2013 1455   LABSPEC 1.016 07/15/2017 1357   LABSPEC 1.019 08/19/2013 1455   PHURINE 5.0 07/15/2017 1357   GLUCOSEU NEGATIVE 07/15/2017 1357   GLUCOSEU Negative 08/19/2013 1455   HGBUR NEGATIVE 07/15/2017 1357   BILIRUBINUR NEGATIVE 07/15/2017 1357   BILIRUBINUR Negative 08/19/2013 1455   KETONESUR NEGATIVE 07/15/2017 1357    PROTEINUR 30 (A) 07/15/2017 1357   NITRITE NEGATIVE 07/15/2017 1357   LEUKOCYTESUR MODERATE (A) 07/15/2017 1357   LEUKOCYTESUR 3+ 08/19/2013 1455     RADIOLOGY: Dg Chest 1 View  Result Date: 07/15/2017 CLINICAL DATA:  Unwitnessed fall. Increasing shortness of breath with generalized weakness. History of CHF, COPD. EXAM: CHEST 1 VIEW COMPARISON:  Chest radiograph April 16, 2017 FINDINGS: Cardiac silhouette is mildly enlarged and unchanged. Calcified aortic knob. Pulmonary vascular congestion. Diffuse interstitial prominence without pleural effusion or focal consolidation. Biapical pleural thickening. No pneumothorax. Dual lead LEFT cardiac pacemaker in situ. Thoracolumbar spine hardware. Soft tissue planes are non suspicious. IMPRESSION: Mild cardiomegaly. Interstitial prominence most compatible with pulmonary edema. Aortic Atherosclerosis (ICD10-I70.0). Electronically Signed   By: Elon Alas M.D.   On: 07/15/2017 14:36   Dg Knee 2 Views Right  Result Date: 07/15/2017 CLINICAL DATA:  84 year old female with unwitnessed fall, right knee pain EXAM: RIGHT KNEE -  1-2 VIEW COMPARISON:  Concurrently obtained radiographs of the right tib-fib FINDINGS: No evidence of acute fracture or malalignment. Medial compartment predominant osteoarthritis with significant loss of joint space height and bone-on-bone contact. Atherosclerotic calcifications present within the visualized vessels. No focal soft tissue abnormality or joint effusion. IMPRESSION: 1. Medial compartment osteoarthritis with near complete loss of joint space height and bone-on-bone contact. 2. No evidence of fracture or malalignment. 3. Atherosclerotic vascular calcifications. Electronically Signed   By: Jacqulynn Cadet M.D.   On: 07/15/2017 14:37   Dg Tibia/fibula Right  Result Date: 07/15/2017 CLINICAL DATA:  84 year old female status post unwitnessed fall with right leg pain EXAM: RIGHT TIBIA AND FIBULA - 2 VIEW COMPARISON:   Concurrently obtained radiographs of the right knee FINDINGS: No evidence of acute fracture or malalignment. Surgical changes of prior calcaneal talar fusion with cannulated lag screws. No evidence of hardware complication. Medial compartment predominant osteoarthritis of the knee. Atherosclerotic vascular calcifications present throughout the posterior tibial artery. IMPRESSION: 1. No acute fracture or malalignment. 2. ORIF of prior calcaneal talar fusion. 3. Atherosclerotic vascular calcifications. Electronically Signed   By: Jacqulynn Cadet M.D.   On: 07/15/2017 14:39    EKG: Orders placed or performed during the hospital encounter of 07/15/17  . EKG 12-Lead  . EKG 12-Lead    IMPRESSION AND PLAN: 1. Acute on chronic systolic congestive heart failure exacerbation, mild Most likely exacerbated by UTI Admit to urologist for bladder or congestive heart failure protocol, IV Lasix 20 mg twice a day for 2 doses then change back to 20 mg every a.m., continue aspirin, Coreg, spironolactone, on Xarelto, strict I&O monitoring, daily weights, ADA/low sodium/cardiac diet, activity was not watching Most recent echocardiogram in June 2018 for ejection fraction 45-percent  2 acute probable urinary tract infection IV Levaquin for 3 day course of follow-up cultures  3 acute on chronic hypoxic respiratory failure Exacerbated by UTI and chf ex Continue O2 w/ weaning as tolerated Noted on 2L via [place] at night only  4 COPD w/o ex Stable BTs prn  5 chronic DM, II Stable  SSI w/ acuchecks per routine  6 chronic Afib Stable S/P pacer and ablation Continue Xarelto and Coreg  7 chronic pain syndrome Stable Continue home regiment  DNR Stable condition Long term prognosis poor   All the records are reviewed and case discussed with ED provider. Management plans discussed with the patient, family and they are in agreement.  CODE STATUS: Code Status History    Date Active Date Inactive Code  Status Order ID Comments User Context   04/14/2017 03:44 04/18/2017 21:38 Full Code [id number]  Lance Coon, MD Inpatient   12/08/2016 01:07 12/09/2016 20:31 Full Code [id number]  Lance Coon, MD ED   09/07/2015 04:41 09/13/2015 19:55 Full Code [id number]  Saundra Shelling, MD Inpatient   09/01/2015 15:33 09/03/2015 19:07 Full Code [id number]  Epifanio Lesches, MD ED   03/21/2015 06:26 03/22/2015 19:39 Full Code [id number]  Harrie Foreman, MD Inpatient    Advance Directive Documentation     Most Recent Value  Type of Advance Directive  Healthcare Power of Attorney, Living will  Pre-existing out of facility DNR order (yellow form or pink MOST form)  No data  "MOST" Form in Place?  No data       TOTAL TIME TAKING CARE OF THIS PATIENT: 45 minutes.    Avel Peace Joscelyne Renville M.D on 07/15/2017   Between 7am to 6pm - Pager - [phone]  After 6pm go to [email or web address] -  password [email or web address] [facility]  Office  [phone]  CC: Primary care physician; Tracie Harrier, MD   Note: This dictation was prepared with Dragon dictation along with smaller phrase technology. Any transcriptional errors that result from this process are unintentional.

## 2017-07-15 NOTE — ED Triage Notes (Signed)
Pt in via [facility] with complaints of increasing shortness of breath, pt also with new onset generalized weakness with unwitnessed fall requiring EMS assistance out of the floor, pt complaining of right knee pain. Pt A/Ox 4, vitals WDL, NAD noted at this time.

## 2017-07-15 NOTE — Progress Notes (Signed)
PHARMACY NOTE:  ANTIMICROBIAL RENAL DOSAGE ADJUSTMENT  Current antimicrobial regimen includes a mismatch between antimicrobial dosage and estimated renal function.  As per policy approved by the Pharmacy & Therapeutics and Medical Executive Committees, the antimicrobial dosage will be adjusted accordingly.  Current antimicrobial dosage:  Levaquin 750 mg IV Q24H   Indication: UTI   Renal Function:  Estimated Creatinine Clearance: 34.1 mL/min (A) (by C-G formula based on SCr of 1.19 mg/dL (H)). []      On intermittent HD, scheduled: []      On CRRT    Antimicrobial dosage has been changed to: Levaquin 750 mg IV Q48H to start 1/24.   Additional comments:   Thank you for allowing pharmacy to be a part of this patient's care.  Orene Desanctis, [facility] 07/15/2017 6:57 PM

## 2017-07-15 NOTE — ED Notes (Signed)
Radiology to bedside at this time. 

## 2017-07-15 NOTE — Progress Notes (Signed)
Dr. Jerelyn Charles notified of critical value Lactic acid 3.1. Orders received.

## 2017-07-15 NOTE — Progress Notes (Addendum)
BP 124/36, T 101.6, Lactic acid at 1800 3.1, started LR at 75.  Called lab to repeat lab.  Call to doctor about BP.  Response pending.  BP improved to 125/57 and pt given tylenol for temperature.  Repeat Lactic acid 1.1 Dorna Bloom RN

## 2017-07-15 NOTE — ED Provider Notes (Signed)
Results are back. Urine is consistent with UTI. Patient allergic to cipro and cephalosporins per allergy chart. Per chart review patient has grown E.Coli in the past and had been treated with Levaquin in the past. Will plan on admission to the hospitalist service. Discussed findings and plan with patient and husband.    Nance Pear, MD 07/15/17 [phone]

## 2017-07-15 NOTE — ED Notes (Signed)
ED Provider at bedside. 

## 2017-07-15 NOTE — ED Notes (Signed)
primedoc in with pt now for admission 

## 2017-07-15 NOTE — ED Notes (Signed)
Pt offered bedpan in attempt to receive urine specimen.

## 2017-07-15 NOTE — ED Notes (Signed)
Report called to [place] rn  Floor nurse

## 2017-07-15 NOTE — ED Notes (Signed)
Pt resting quietly.  Family at bedside.  Paced rhythm on monitor.

## 2017-07-16 LAB — BLOOD CULTURE ID PANEL (REFLEXED)
ACINETOBACTER BAUMANNII: NOT DETECTED
CANDIDA TROPICALIS: NOT DETECTED
Candida albicans: NOT DETECTED
Candida glabrata: NOT DETECTED
Candida krusei: NOT DETECTED
Candida parapsilosis: NOT DETECTED
ENTEROBACTER CLOACAE COMPLEX: NOT DETECTED
ENTEROCOCCUS SPECIES: NOT DETECTED
Enterobacteriaceae species: NOT DETECTED
Escherichia coli: NOT DETECTED
HAEMOPHILUS INFLUENZAE: NOT DETECTED
KLEBSIELLA PNEUMONIAE: NOT DETECTED
Klebsiella oxytoca: NOT DETECTED
LISTERIA MONOCYTOGENES: NOT DETECTED
NEISSERIA MENINGITIDIS: NOT DETECTED
PSEUDOMONAS AERUGINOSA: NOT DETECTED
Proteus species: NOT DETECTED
STREPTOCOCCUS AGALACTIAE: DETECTED — AB
STREPTOCOCCUS PNEUMONIAE: NOT DETECTED
STREPTOCOCCUS SPECIES: DETECTED — AB
Serratia marcescens: NOT DETECTED
Staphylococcus aureus (BCID): NOT DETECTED
Staphylococcus species: NOT DETECTED
Streptococcus pyogenes: NOT DETECTED

## 2017-07-16 LAB — CBC
HCT: 30.7 % — ABNORMAL LOW (ref 35.0–47.0)
HEMOGLOBIN: 9.9 g/dL — AB (ref 12.0–16.0)
MCH: 29.7 pg (ref 26.0–34.0)
MCHC: 32.1 g/dL (ref 32.0–36.0)
MCV: 92.4 fL (ref 80.0–100.0)
PLATELETS: 103 10*3/uL — AB (ref 150–440)
RBC: 3.32 MIL/uL — ABNORMAL LOW (ref 3.80–5.20)
RDW: 15.1 % — ABNORMAL HIGH (ref 11.5–14.5)
WBC: 15.2 10*3/uL — AB (ref 3.6–11.0)

## 2017-07-16 LAB — GLUCOSE, CAPILLARY
GLUCOSE-CAPILLARY: 161 mg/dL — AB (ref 65–99)
GLUCOSE-CAPILLARY: 238 mg/dL — AB (ref 65–99)
Glucose-Capillary: 98 mg/dL (ref 65–99)
Glucose-Capillary: 102 mg/dL — ABNORMAL HIGH (ref 65–99)

## 2017-07-16 LAB — BASIC METABOLIC PANEL
ANION GAP: 8 (ref 5–15)
BUN: 35 mg/dL — ABNORMAL HIGH (ref 6–20)
CALCIUM: 8.6 mg/dL — AB (ref 8.9–10.3)
CO2: 30 mmol/L (ref 22–32)
CREATININE: 1.28 mg/dL — AB (ref 0.44–1.00)
Chloride: 97 mmol/L — ABNORMAL LOW (ref 101–111)
GFR, EST AFRICAN AMERICAN: 41 mL/min — AB (ref >60)
GFR, EST NON AFRICAN AMERICAN: 36 mL/min — AB (ref >60)
Glucose, Bld: 172 mg/dL — ABNORMAL HIGH (ref 65–99)
Potassium: 3.4 mmol/L — ABNORMAL LOW (ref 3.5–5.1)
SODIUM: 135 mmol/L (ref 135–145)

## 2017-07-16 LAB — LACTIC ACID, PLASMA
LACTIC ACID, VENOUS: 1.1 mmol/L (ref 0.5–1.9)
Lactic Acid, Venous: 1.1 mmol/L (ref 0.5–1.9)

## 2017-07-16 MED ORDER — POTASSIUM CHLORIDE CRYS ER 20 MEQ PO TBCR
40.0000 meq | EXTENDED_RELEASE_TABLET | Freq: Once | ORAL | Status: AC
  Administered 2017-07-16: 12:00:00 40 meq via ORAL
  Filled 2017-07-16: qty 2

## 2017-07-16 MED ORDER — PIPERACILLIN-TAZOBACTAM 3.375 G IVPB
3.3750 g | Freq: Three times a day (TID) | INTRAVENOUS | Status: DC
  Administered 2017-07-16 – 2017-07-18 (×6): 3.375 g via INTRAVENOUS
  Filled 2017-07-16 (×6): qty 50

## 2017-07-16 MED ORDER — MAGNESIUM SULFATE 2 GM/50ML IV SOLN
2.0000 g | Freq: Once | INTRAVENOUS | Status: AC
  Administered 2017-07-16: 12:00:00 2 g via INTRAVENOUS
  Filled 2017-07-16: qty 50

## 2017-07-16 MED ORDER — VANCOMYCIN HCL 10 G IV SOLR
1250.0000 mg | Freq: Once | INTRAVENOUS | Status: AC
  Administered 2017-07-16: 07:00:00 1250 mg via INTRAVENOUS
  Filled 2017-07-16: qty 1250

## 2017-07-16 MED ORDER — POTASSIUM CHLORIDE CRYS ER 20 MEQ PO TBCR
20.0000 meq | EXTENDED_RELEASE_TABLET | Freq: Two times a day (BID) | ORAL | Status: DC
  Administered 2017-07-17 – 2017-07-19 (×5): 20 meq via ORAL
  Filled 2017-07-16 (×5): qty 1

## 2017-07-16 NOTE — Progress Notes (Signed)
Initial Nutrition Assessment  DOCUMENTATION CODES:   Not applicable  INTERVENTION:   - Continue Glucerna Shake po BID, each supplement provides 220 kcal and 10 grams of protein. Pt prefers chocolate flavor. - Encourage PO intake.  NUTRITION DIAGNOSIS:   Inadequate oral intake related to poor appetite as evidenced by per patient/family report.  GOAL:   Patient will meet greater than or equal to 90% of their needs  MONITOR:   PO intake, Supplement acceptance, Labs  REASON FOR ASSESSMENT:   Consult Assessment of nutrition requirement/status  ASSESSMENT:   84 year old female who presented to ED with weakness and SOB. Admitted for UTI and mild acute on chronic systolic CHF exacerbation. Pt with PMH significant for COPD, DM, HTN, and CHF.  Spoke with pt at bedside who reports poor appetite for 4-5 days PTA. Pt states that before that, she had a good appetite and would eat three meals daily. Breakfast might include cheerios, a banana, and toast. Lunch might include a sandwich. Dinner might include chicken Parmescan or spaghetti. Pt states she has had a better appetite since admission and was able to eat "a little" of her breakfast. Meal completion recorded as 50%.  Pt states her weight has been stable for "some time." Per weight history, pt has weighed between 170-179 since April 2018. Pt states her UBW is 185 lbs.  Pt reports enjoying the Glucerna oral nutrition supplements. Will continue with current order of Glucerna BID between meals.  Medications reviewed and include: vitamin D, Colace, ferrous sulfate, Lasix, Novolog, magnesium oxide, MVI with minerals, Protonix, KCl, Senokot, vitamin B12, vitamin C  Labs reviewed: potassium 3.4 (L), glucose 172 (H), BUN 35 (H), creatinine 1.28 (H), calcium 8.6 (L) CBG's: 98, 233  NUTRITION - FOCUSED PHYSICAL EXAM:    Most Recent Value  Orbital Region  No depletion  Upper Arm Region  No depletion  Thoracic and Lumbar Region  No  depletion  Buccal Region  No depletion  Temple Region  Mild depletion  Clavicle Bone Region  Mild depletion  Clavicle and Acromion Bone Region  Mild depletion  Scapular Bone Region  Unable to assess  Dorsal Hand  Mild depletion  Patellar Region  No depletion  Anterior Thigh Region  No depletion  Posterior Calf Region  No depletion  Edema (RD Assessment)  Mild  Hair  Reviewed  Eyes  Reviewed  Mouth  Reviewed  Skin  Reviewed  Nails  Reviewed       Diet Order:  Diet heart healthy/carb modified Room service appropriate? Yes; Fluid consistency: Thin Aspiration precautions Fall precautions  EDUCATION NEEDS:   No education needs have been identified at this time  Skin:  Skin Assessment: Reviewed RN Assessment  Last BM:  PTA  Height:   Ht Readings from Last 1 Encounters:  07/15/17 5\' 7"  (1.702 m)    Weight:   Wt Readings from Last 1 Encounters:  07/15/17 175 lb (79.4 kg)    Ideal Body Weight:  61.4 kg  BMI:  Body mass index is 27.41 kg/m.  Estimated Nutritional Needs:   Kcal:  1500-1700 kcal/day  Protein:  95-105 grams/day  Fluid:  1.5-1.7 L/day    Gaynell Face, MS, RD, LDN Pager: [phone] Weekend/After Hours: [phone]

## 2017-07-16 NOTE — Clinical Social Work Note (Signed)
Clinical Social Work Assessment  Patient Details  [name] MRN: [id number] Date of Birth: 09/06/1924  Date of referral:  07/16/17               Reason for consult:  Facility Placement                Permission sought to share information with:    Permission granted to share information::     Name::        Agency::     Relationship::     Contact Information:     Housing/Transportation Living arrangements for the past 2 months:  Single Family Home Source of Information:  Patient, Spouse Patient Interpreter Needed:  None Criminal Activity/Legal Involvement Pertinent to Current Situation/Hospitalization:  No - Comment as needed Significant Relationships:  Spouse Lives with:  Spouse Do you feel safe going back to the place where you live?  Yes Need for family participation in patient care:  Yes (Comment)  Care giving concerns:  Patient lives in [place] with [name].     Social Worker assessment / plan:  Holiday representative (CSW) reviewed chart and noted that PT is recommending SNF. CSW met with patient and [name] and caregiver were at bedside. Patient was alert and oriented X4 and was sitting up in the chair at bedside. CSW introduced self and explained role of CSW department. Patient reported that she lives in [place] with her husband. Per husband they have hired private caregivers every day from 9 am-12 pm and 6 pm to 9 pm. CSW explained SNF process and that [facility] will have to approve SNF. Patient adamantly refused SNF and stated that she wants to go home. Per patient she is already open to [facility] home [facility]. Patient's husband also feels comfortable taking patient home. RN case manager aware of above. CSW will continue to follow and assist as needed.     Employment status:  Retired Nurse, adult PT Recommendations:  [place] / Referral to community resources:  Other (Comment  Required)(Patient refused SNF. )  Patient/Family's Response to care:  Patient refused SNF.   Patient/Family's Understanding of and Emotional Response to Diagnosis, Current Treatment, and Prognosis:  Patient and her husband were pleasant and thanked CSW for assistance.   Emotional Assessment Appearance:  Appears stated age Attitude/Demeanor/Rapport:    Affect (typically observed):  Accepting, Adaptable, Pleasant Orientation:  Oriented to Self, Oriented to Place, Oriented to  Time, Oriented to Situation Alcohol / Substance use:  Not Applicable Psych involvement (Current and /or in the community):  No (Comment)  Discharge Needs  Concerns to be addressed:  Discharge Planning Concerns Readmission within the last 30 days:  No Current discharge risk:  Dependent with Mobility Barriers to Discharge:  Continued Medical Work up   [employer], Veronia Beets, LCSW 07/16/2017, 2:08 PM

## 2017-07-16 NOTE — Progress Notes (Signed)
PHARMACY - PHYSICIAN COMMUNICATION CRITICAL VALUE ALERT - BLOOD CULTURE IDENTIFICATION (BCID)  [name] is an 84 y.o. female who presented to [facility] on 07/15/2017 with a chief complaint of   Assessment:  BCID+ S. agalactiae 1/4 (include suspected source if known)  Name of physician (or Provider) Contacted: Pyreddy  Current antibiotics: levaquin  Changes to prescribed antibiotics recommended:  Vancomycin x1 per attending  Results for orders placed or performed during the hospital encounter of 07/15/17  Blood Culture ID Panel (Reflexed) (Collected: 07/15/2017  1:57 PM)  Result Value Ref Range   Enterococcus species NOT DETECTED NOT DETECTED   Listeria monocytogenes NOT DETECTED NOT DETECTED   Staphylococcus species NOT DETECTED NOT DETECTED   Staphylococcus aureus NOT DETECTED NOT DETECTED   Streptococcus species DETECTED (A) NOT DETECTED   Streptococcus agalactiae DETECTED (A) NOT DETECTED   Streptococcus pneumoniae NOT DETECTED NOT DETECTED   Streptococcus pyogenes NOT DETECTED NOT DETECTED   Acinetobacter baumannii NOT DETECTED NOT DETECTED   Enterobacteriaceae species NOT DETECTED NOT DETECTED   Enterobacter cloacae complex NOT DETECTED NOT DETECTED   Escherichia coli NOT DETECTED NOT DETECTED   Klebsiella oxytoca NOT DETECTED NOT DETECTED   Klebsiella pneumoniae NOT DETECTED NOT DETECTED   Proteus species NOT DETECTED NOT DETECTED   Serratia marcescens NOT DETECTED NOT DETECTED   Haemophilus influenzae NOT DETECTED NOT DETECTED   Neisseria meningitidis NOT DETECTED NOT DETECTED   Pseudomonas aeruginosa NOT DETECTED NOT DETECTED   Candida albicans NOT DETECTED NOT DETECTED   Candida glabrata NOT DETECTED NOT DETECTED   Candida krusei NOT DETECTED NOT DETECTED   Candida parapsilosis NOT DETECTED NOT DETECTED   Candida tropicalis NOT DETECTED NOT DETECTED    Taos Tapp S 07/16/2017  6:06 AM

## 2017-07-16 NOTE — Progress Notes (Signed)
Patient ID: [name], female   DOB: 10-27-1924, 84 y.o.   MRN: [id number]  [employer] PROGRESS NOTE  [name] YSA:[id number] DOB: Jun 28, 1924 DOA: 07/15/2017 PCP: Tracie Harrier, MD  HPI/Subjective: Patient felt horrible yesterday.  Feeling better today.  Some nausea now.  Objective: Vitals:   07/15/17 2329 07/16/17 0448  BP: (!) 125/57 (!) 168/66  Pulse: 75 62  Resp:    Temp:  97.7 F (36.5 C)  SpO2:  100%    Filed Weights   07/15/17 1402  Weight: 79.4 kg (175 lb)    ROS: Review of Systems  Constitutional: Negative for chills and fever.  Eyes: Negative for blurred vision.  Respiratory: Negative for cough and shortness of breath.   Cardiovascular: Negative for chest pain.  Gastrointestinal: Positive for nausea. Negative for abdominal pain, constipation, diarrhea and vomiting.  Genitourinary: Negative for dysuria.  Musculoskeletal: Negative for joint pain.  Neurological: Negative for dizziness and headaches.   Exam: Physical Exam  HENT:  Nose: No mucosal edema.  Mouth/Throat: No oropharyngeal exudate or posterior oropharyngeal edema.  Eyes: Conjunctivae, EOM and lids are normal. Pupils are equal, round, and reactive to light.  Neck: No JVD present. Carotid bruit is not present. No edema present. No thyroid mass and no thyromegaly present.  Cardiovascular: S1 normal and S2 normal. Exam reveals no gallop.  No murmur heard. Pulses:      Dorsalis pedis pulses are 2+ on the right side, and 2+ on the left side.  Respiratory: No respiratory distress. She has decreased breath sounds in the right lower field and the left lower field. She has no wheezes. She has no rhonchi. She has rales in the right lower field and the left lower field.  GI: Soft. Bowel sounds are normal. There is no tenderness.  Musculoskeletal:       Right ankle: She exhibits swelling.       Left ankle: She exhibits swelling.  Lymphadenopathy:    She has no cervical adenopathy.   Neurological: She is alert. No cranial nerve deficit.  Skin: Skin is warm. Nails show no clubbing.  Psychiatric: She has a normal mood and affect.      Data Reviewed: Basic Metabolic Panel: Recent Labs  Lab 07/15/17 1357 07/16/17 0146  NA 135 135  K 4.3 3.4*  CL 100* 97*  CO2 28 30  GLUCOSE 265* 172*  BUN 32* 35*  CREATININE 1.19* 1.28*  CALCIUM 8.7* 8.6*   Liver Function Tests: Recent Labs  Lab 07/15/17 1357  AST 30  ALT 16  ALKPHOS 63  BILITOT 1.3*  PROT 6.7  ALBUMIN 3.6   CBC: Recent Labs  Lab 07/15/17 1357 07/16/17 0146  WBC 15.1* 15.2*  NEUTROABS 13.4*  --   HGB 10.9* 9.9*  HCT 33.0* 30.7*  MCV 93.6 92.4  PLT 106* 103*   Cardiac Enzymes: Recent Labs  Lab 07/15/17 1357  TROPONINI 0.06*   BNP (last 3 results) Recent Labs    12/07/16 2038 12/13/16 0645 07/15/17 1357  BNP 537.0* 264.0* 880.0*   CBG: Recent Labs  Lab 07/15/17 2124 07/16/17 0757  GLUCAP 233* 98    Recent Results (from the past 240 hour(s))  Blood Culture (routine x 2)     Status: None (Preliminary result)   Collection Time: 07/15/17  1:57 PM  Result Value Ref Range Status   Specimen Description BLOOD LEFT ANTECUBITAL  Final   Special Requests   Final    BOTTLES DRAWN AEROBIC AND ANAEROBIC Blood  Culture results may not be optimal due to an excessive volume of blood received in culture bottles   Culture  Setup Time   Final    Organism ID to follow [place] CRITICAL RESULT CALLED TO, READ BACK BY AND VERIFIED WITH: MATT MCBANE AT [place] ON 07/16/17 [place]. Performed at [facility], [address] [place], [place] [address]    Culture [facility] POSITIVE COCCI  Final   Report Status PENDING  Incomplete  Blood Culture (routine x 2)     Status: None (Preliminary result)   Collection Time: 07/15/17  1:57 PM  Result Value Ref Range Status   Specimen Description BLOOD BLOOD LEFT HAND  Final   Special Requests   Final    BOTTLES DRAWN  AEROBIC AND ANAEROBIC Blood Culture results may not be optimal due to an excessive volume of blood received in culture bottles   Culture   Final    NO GROWTH < 24 HOURS Performed at [facility] [address] [place], [place] [address]    Report Status PENDING  Incomplete  Blood Culture ID Panel (Reflexed)     Status: Abnormal   Collection Time: 07/15/17  1:57 PM  Result Value Ref Range Status   Enterococcus species NOT DETECTED NOT DETECTED Final   Listeria monocytogenes NOT DETECTED NOT DETECTED Final   Staphylococcus species NOT DETECTED NOT DETECTED Final   Staphylococcus aureus NOT DETECTED NOT DETECTED Final   Streptococcus species DETECTED (A) NOT DETECTED Final    Comment: CRITICAL RESULT CALLED TO, READ BACK BY AND VERIFIED WITH: MATT MCBANE AT 0552 ON 07/16/17 [place].    Streptococcus agalactiae DETECTED (A) NOT DETECTED Final    Comment: CRITICAL RESULT CALLED TO, READ BACK BY AND VERIFIED WITH: MATT MCBANE AT 1751 ON 07/16/17 [place].    Streptococcus pneumoniae NOT DETECTED NOT DETECTED Final   Streptococcus pyogenes NOT DETECTED NOT DETECTED Final   Acinetobacter baumannii NOT DETECTED NOT DETECTED Final   Enterobacteriaceae species NOT DETECTED NOT DETECTED Final   Enterobacter cloacae complex NOT DETECTED NOT DETECTED Final   Escherichia coli NOT DETECTED NOT DETECTED Final   Klebsiella oxytoca NOT DETECTED NOT DETECTED Final   Klebsiella pneumoniae NOT DETECTED NOT DETECTED Final   Proteus species NOT DETECTED NOT DETECTED Final   Serratia marcescens NOT DETECTED NOT DETECTED Final   Haemophilus influenzae NOT DETECTED NOT DETECTED Final   Neisseria meningitidis NOT DETECTED NOT DETECTED Final   Pseudomonas aeruginosa NOT DETECTED NOT DETECTED Final   Candida albicans NOT DETECTED NOT DETECTED Final   Candida glabrata NOT DETECTED NOT DETECTED Final   Candida krusei NOT DETECTED NOT DETECTED Final   Candida parapsilosis NOT DETECTED NOT DETECTED Final    Candida tropicalis NOT DETECTED NOT DETECTED Final    Comment: Performed at [facility], [address], [place], [place] [address]     Studies: Dg Chest 1 View  Result Date: 07/15/2017 CLINICAL DATA:  Unwitnessed fall. Increasing shortness of breath with generalized weakness. History of CHF, COPD. EXAM: CHEST 1 VIEW COMPARISON:  Chest radiograph April 16, 2017 FINDINGS: Cardiac silhouette is mildly enlarged and unchanged. Calcified aortic knob. Pulmonary vascular congestion. Diffuse interstitial prominence without pleural effusion or focal consolidation. Biapical pleural thickening. No pneumothorax. Dual lead LEFT cardiac pacemaker in situ. Thoracolumbar spine hardware. Soft tissue planes are non suspicious. IMPRESSION: Mild cardiomegaly. Interstitial prominence most compatible with pulmonary edema. Aortic Atherosclerosis (ICD10-I70.0). Electronically Signed   By: Thana Farr.D.  On: 07/15/2017 14:36   Dg Knee 2 Views Right  Result Date: 07/15/2017 CLINICAL DATA:  84 year old female with unwitnessed fall, right knee pain EXAM: RIGHT KNEE - 1-2 VIEW COMPARISON:  Concurrently obtained radiographs of the right tib-fib FINDINGS: No evidence of acute fracture or malalignment. Medial compartment predominant osteoarthritis with significant loss of joint space height and bone-on-bone contact. Atherosclerotic calcifications present within the visualized vessels. No focal soft tissue abnormality or joint effusion. IMPRESSION: 1. Medial compartment osteoarthritis with near complete loss of joint space height and bone-on-bone contact. 2. No evidence of fracture or malalignment. 3. Atherosclerotic vascular calcifications. Electronically Signed   By: Jacqulynn Cadet M.D.   On: 07/15/2017 14:37   Dg Tibia/fibula Right  Result Date: 07/15/2017 CLINICAL DATA:  84 year old female status post unwitnessed fall with right leg pain EXAM: RIGHT TIBIA AND FIBULA - 2 VIEW COMPARISON:  Concurrently  obtained radiographs of the right knee FINDINGS: No evidence of acute fracture or malalignment. Surgical changes of prior calcaneal talar fusion with cannulated lag screws. No evidence of hardware complication. Medial compartment predominant osteoarthritis of the knee. Atherosclerotic vascular calcifications present throughout the posterior tibial artery. IMPRESSION: 1. No acute fracture or malalignment. 2. ORIF of prior calcaneal talar fusion. 3. Atherosclerotic vascular calcifications. Electronically Signed   By: Jacqulynn Cadet M.D.   On: 07/15/2017 14:39    Scheduled Meds: . aspirin EC  81 mg Oral Daily  . baclofen  10 mg Oral TID  . budesonide (PULMICORT) nebulizer solution  0.5 mg Nebulization BID  . carvedilol  6.25 mg Oral BID WC  . cholecalciferol  1,000 Units Oral Daily  . docusate sodium  100 mg Oral BID  . escitalopram  10 mg Oral Daily  . ferrous sulfate  325 mg Oral Q breakfast  . furosemide  20 mg Intravenous BID  . [START ON 07/17/2017] furosemide  20 mg Oral Daily  . gabapentin  100 mg Oral QHS  . GLUCERNA  237 mL Oral BID BM  . insulin aspart  0-15 Units Subcutaneous TID WC  . insulin aspart  0-5 Units Subcutaneous QHS  . magnesium oxide  400 mg Oral Daily  . Melatonin  2.5 mg Oral QHS  . multivitamin with minerals  1 tablet Oral Daily  . pantoprazole  40 mg Oral Daily  . potassium chloride  40 mEq Oral Once  . Rivaroxaban  15 mg Oral Daily  . rOPINIRole  0.5 mg Oral BID  . senna  1 tablet Oral BID  . sodium chloride flush  3 mL Intravenous Q12H  . sodium chloride flush  3 mL Intravenous Q12H  . spironolactone  25 mg Oral Daily  . vitamin B-12  1,000 mcg Oral Daily  . vitamin C  500 mg Oral Daily   Continuous Infusions: . sodium chloride    . magnesium sulfate 1 - 4 g bolus IVPB      Assessment/Plan:  1. Clinical sepsis present on admission.  Strep agalactiae in the blood cultures.  Likely urine source.  Follow-up blood cultures for sensitivities.  Follow-up  urine culture.  Switched antibiotics to Zosyn since she did have this in the past. 2. Relative hypotension in the past.  Stop IV fluids with history of CHF. 3. Acute on chronic diastolic congestive heart failure.  Stop IV fluids and continue Lasix. 4. Type 2 diabetes mellitus on sliding scale 5. COPD.  Continue inhalers 6. Peripheral vascular disease on Xarelto and aspirin 7. Weakness.  Physical therapy evaluation  Code  Status:     Code Status Orders  (From admission, onward)        Start     Ordered   07/15/17 1830  Do not attempt resuscitation (DNR)  Continuous    Question Answer Comment  In the event of cardiac or respiratory ARREST Do not call a "code blue"   In the event of cardiac or respiratory ARREST Do not perform Intubation, CPR, defibrillation or ACLS   In the event of cardiac or respiratory ARREST Use medication by any route, position, wound care, and other measures to relive pain and suffering. May use oxygen, suction and manual treatment of airway obstruction as needed for comfort.      07/15/17 1830    Code Status History    Date Active Date Inactive Code Status Order ID Comments User Context   04/14/2017 03:44 04/18/2017 21:38 Full Code [id number]  Lance Coon, MD Inpatient   12/08/2016 01:07 12/09/2016 20:31 Full Code [id number]  Lance Coon, MD ED   09/07/2015 04:41 09/13/2015 19:55 Full Code [id number]  Saundra Shelling, MD Inpatient   09/01/2015 15:33 09/03/2015 19:07 Full Code [id number]  Epifanio Lesches, MD ED   03/21/2015 06:26 03/22/2015 19:39 Full Code [id number]  Harrie Foreman, MD Inpatient    Advance Directive Documentation     Most Recent Value  Type of Advance Directive  Healthcare Power of Attorney, Living will  Pre-existing out of facility DNR order (yellow form or pink MOST form)  No data  "MOST" Form in Place?  No data     Family Communication: Spoke with husband on the phone Disposition Plan: To be determined  Antibiotics:  Changed  antibiotics to Zosyn  Time spent: 28 minutes  [place]

## 2017-07-16 NOTE — Progress Notes (Signed)
Pharmacy Antibiotic Note  [name] is a 84 y.o. female admitted on 07/15/2017 with UTI and streptococcus agalactiae bacteremia.  Pharmacy has been consulted for piperacillin/tazobactam dosing.  Patient has ceftriaxone allergy on profile (2016), but received piperacillin/tazobactam for several days in March 2017. Per discussion with MD, antibiotics being changed from levofloxacin to piperacillin/tazobactam.   Plan: Piperacillin/tazobactam 3.375 g IV q8h EI  Height: 5\' 7"  (170.2 cm) Weight: 175 lb (79.4 kg) IBW/kg (Calculated) : 61.6  Temp (24hrs), Avg:99.2 F (37.3 C), Min:97.7 F (36.5 C), Max:101.6 F (38.7 C)  Recent Labs  Lab 07/15/17 1357 07/15/17 1809 07/15/17 2327 07/16/17 0146  WBC 15.1*  --   --  15.2*  CREATININE 1.19*  --   --  1.28*  LATICACIDVEN 1.7 3.1* 1.1 1.1    Estimated Creatinine Clearance: 31.7 mL/min (A) (by C-G formula based on SCr of 1.28 mg/dL (H)).    Allergies  Allergen Reactions  . Ceftriaxone Swelling  . Cefuroxime Hives  . Cephalosporins Swelling and Other (See Comments)    Reaction:  Fainting/dry mouth   . Codeine Nausea And Vomiting  . Epinephrine Other (See Comments)    Reaction:  Fainting   . Morphine And Related Other (See Comments)    Reaction:  GI upset   . Buprenorphine Hcl Nausea And Vomiting  . Ciprofloxacin Itching, Swelling and Rash  . Latex Rash  . Lidocaine Rash  . Procaine Rash    Antimicrobials this admission: Piperacillin/tazobactam 1/23 >>  Vancomycin 1 dose on 1/23 Levofloxacin 1/22 >> 1/23  Dose adjustments this admission:  Microbiology results: 1/22 BCx: Streptococcus agalactiae 1/4 1/22 UCx: Sent   Thank you for allowing pharmacy to be a part of this patient's care.  Lenis Noon, PharmD, BCPS Clinical Pharmacist 07/16/2017 9:15 AM

## 2017-07-16 NOTE — NC FL2 (Signed)
[place] LEVEL OF CARE SCREENING TOOL     IDENTIFICATION  Patient [name] Birthdate: [birth year] Sex: female Admission Date (Current Location): 07/15/2017  [place] and [place] Number:  Engineering geologist and Address:  [facility], [address] [place], [place] [address]      Provider Number: [id number]  Attending Physician Name and Address:  Loletha Grayer, MD  Relative Name and Phone Number:       Current Level of Care: Hospital Recommended Level of Care:  Prior Approval Number:    Date Approved/Denied:   PASRR Number: ([id number] )  Discharge Plan: SNF    Current Diagnoses: Patient Active Problem List   Diagnosis Date Noted  . Sepsis ([place]) 04/14/2017  . CAP (community acquired pneumonia) 04/14/2017  . COPD (chronic obstructive pulmonary disease) ([place]) 12/07/2016  . UTI (urinary tract infection) 12/07/2016  . Ventral hernia without obstruction or gangrene   . Pneumonia 09/07/2015  . Hyponatremia 09/07/2015  . Acute respiratory failure ([place]) 09/01/2015  . Atrial fibrillation ([place]) 04/10/2015  . Essential hypertension 04/10/2015  . Diabetes ([place]) 04/10/2015  . Back pain 04/10/2015  . Acute on chronic combined systolic and diastolic CHF (congestive heart failure) ([place]) 03/21/2015  . Pulmonary nodule 11/10/2014    Orientation RESPIRATION BLADDER Height & Weight     Self, Time, Situation, Place  Normal Continent Weight: 175 lb (79.4 kg) Height:  5\' 7"  (170.2 cm)  BEHAVIORAL SYMPTOMS/MOOD NEUROLOGICAL BOWEL NUTRITION STATUS      Continent Diet(Diet: Heart Healthy/ Carb Modified. )  AMBULATORY STATUS COMMUNICATION OF NEEDS Skin   Extensive Assist Verbally Normal                       Personal Care Assistance Level of Assistance  Bathing, Feeding, Dressing Bathing Assistance: Limited assistance Feeding assistance: Independent Dressing Assistance: Limited assistance      Functional Limitations Info  Sight, Hearing, Speech Sight Info: Adequate Hearing Info: Impaired Speech Info: Adequate    SPECIAL CARE FACTORS FREQUENCY  PT (By licensed PT), OT (By licensed OT)     PT Frequency: (5) OT Frequency: (5)            Contractures      Additional Factors Info  Code Status, Allergies Code Status Info: (DNR ) Allergies Info: (Ceftriaxone, Cefuroxime, Cephalosporins, Codeine, Epinephrine, Morphine And Related, Buprenorphine Hcl, Ciprofloxacin, Latex, Lidocaine, Procaine)           Current Medications (07/16/2017):  This is the current hospital active medication list Current Facility-Administered Medications  Medication Dose Route Frequency Provider Last Rate Last Dose  . 0.9 %  sodium chloride infusion  250 mL Intravenous PRN Salary, Montell D, MD      . acetaminophen (TYLENOL) tablet 650 mg  650 mg Oral Q6H PRN Salary, Montell D, MD   650 mg at 07/15/17 2332   Or  . acetaminophen (TYLENOL) suppository 650 mg  650 mg Rectal Q6H PRN Salary, Montell D, MD      . albuterol (PROVENTIL) (2.5 MG/3ML) 0.083% nebulizer solution 2.5 mg  2.5 mg Nebulization Q6H PRN Salary, Montell D, MD      . ALPRAZolam Duanne Moron) tablet 0.5 mg  0.5 mg Oral Q8H PRN Salary, Montell D, MD      . aspirin EC tablet 81 mg  81 mg Oral Daily Salary, Montell D, MD   81 mg at 07/16/17 1143  . baclofen (LIORESAL) tablet 10 mg  10  mg Oral TID Loney Hering D, MD   10 mg at 07/16/17 1144  . bisacodyl (DULCOLAX) suppository 10 mg  10 mg Rectal Daily PRN Salary, Montell D, MD      . budesonide (PULMICORT) nebulizer solution 0.5 mg  0.5 mg Nebulization BID Salary, Montell D, MD   0.5 mg at 07/16/17 0735  . carvedilol (COREG) tablet 6.25 mg  6.25 mg Oral BID WC Salary, Montell D, MD   6.25 mg at 07/16/17 1143  . cholecalciferol (VITAMIN D) tablet 1,000 Units  1,000 Units Oral Daily Salary, Montell D, MD   1,000 Units at 07/16/17 1143  . docusate sodium (COLACE) capsule 100 mg  100 mg Oral  BID Salary, Holly Bodily D, MD   100 mg at 07/16/17 1143  . escitalopram (LEXAPRO) tablet 10 mg  10 mg Oral Daily Salary, Montell D, MD   10 mg at 07/16/17 1144  . ferrous sulfate tablet 325 mg  325 mg Oral Q breakfast Salary, Montell D, MD   325 mg at 07/16/17 1142  . [START ON 07/17/2017] furosemide (LASIX) tablet 20 mg  20 mg Oral Daily Salary, Montell D, MD      . gabapentin (NEURONTIN) capsule 100 mg  100 mg Oral QHS Salary, Montell D, MD   100 mg at 07/15/17 1944  . GLUCERNA liquid 237 mL  237 mL Oral BID BM Wieting, Richard, MD   237 mL at 07/16/17 1215  . hydrALAZINE (APRESOLINE) injection 10 mg  10 mg Intravenous Q4H PRN Salary, Montell D, MD      . insulin aspart (novoLOG) injection 0-15 Units  0-15 Units Subcutaneous TID WC Salary, Montell D, MD      . insulin aspart (novoLOG) injection 0-5 Units  0-5 Units Subcutaneous QHS Loney Hering D, MD   2 Units at 07/15/17 2223  . ipratropium (ATROVENT) nebulizer solution 0.5 mg  0.5 mg Nebulization Q6H PRN Salary, Montell D, MD      . magnesium oxide (MAG-OX) tablet 400 mg  400 mg Oral Daily Salary, Montell D, MD   400 mg at 07/16/17 1143  . Melatonin TABS 2.5 mg  2.5 mg Oral QHS Salary, Montell D, MD   2.5 mg at 07/15/17 1943  . multivitamin with minerals tablet 1 tablet  1 tablet Oral Daily Salary, Montell D, MD   1 tablet at 07/16/17 1143  . ondansetron (ZOFRAN) tablet 4 mg  4 mg Oral Q6H PRN Salary, Montell D, MD       Or  . ondansetron (ZOFRAN) injection 4 mg  4 mg Intravenous Q6H PRN Salary, Montell D, MD      . oxyCODONE (Oxy IR/ROXICODONE) immediate release tablet 5 mg  5 mg Oral Q6H PRN Salary, Montell D, MD   5 mg at 07/15/17 1945  . pantoprazole (PROTONIX) EC tablet 40 mg  40 mg Oral Daily Salary, Montell D, MD   40 mg at 07/16/17 1142  . piperacillin-tazobactam (ZOSYN) IVPB 3.375 g  3.375 g Intravenous Q8H Wieting, Richard, MD      . polyethylene glycol (MIRALAX / GLYCOLAX) packet 17 g  17 g Oral Daily PRN Salary, Montell D, MD      .  Rivaroxaban (XARELTO) tablet 15 mg  15 mg Oral Daily Salary, Montell D, MD   15 mg at 07/16/17 1145  . rOPINIRole (REQUIP) tablet 0.5 mg  0.5 mg Oral BID Salary, Montell D, MD   0.5 mg at 07/16/17 1143  . senna (SENOKOT) tablet 8.6 mg  1 tablet Oral BID Salary, Montell D, MD   8.6 mg at 07/16/17 1142  . sodium chloride flush (NS) 0.9 % injection 3 mL  3 mL Intravenous Q12H Salary, Montell D, MD   3 mL at 07/16/17 1149  . sodium chloride flush (NS) 0.9 % injection 3 mL  3 mL Intravenous Q12H Salary, Montell D, MD   3 mL at 07/16/17 1145  . sodium chloride flush (NS) 0.9 % injection 3 mL  3 mL Intravenous PRN Salary, Montell D, MD      . spironolactone (ALDACTONE) tablet 25 mg  25 mg Oral Daily Salary, Montell D, MD   25 mg at 07/16/17 1143  . vitamin B-12 (CYANOCOBALAMIN) tablet 1,000 mcg  1,000 mcg Oral Daily Salary, Montell D, MD   1,000 mcg at 07/16/17 1143  . vitamin C (ASCORBIC ACID) tablet 500 mg  500 mg Oral Daily Salary, Montell D, MD   500 mg at 07/16/17 1144     Discharge Medications: Please see discharge summary for a list of discharge medications.  Relevant Imaging Results:  Relevant Lab Results:   Additional Information (SSN: [id number])  Rose-Marie Hickling, Veronia Beets, LCSW

## 2017-07-16 NOTE — Progress Notes (Signed)
Physical Therapy Evaluation Patient Details [name] MRN: [id number] DOB: 1924/09/01 Today's Date: 07/16/2017   History of Present Illness  [name]  is a 84 y.o. female with a known history for CAD and shortness of breath states that she became acutely weak yesterday while sitting at EOB after walking to the bathroom. She denies a fall as previously reported in record. She called EMS and was transferred to ER. In ER workup was significant for tachypnea, troponin 0.06, white count 15,000, platelet count 106, hemoglobin 10.9-60, total bili 1.3, urinalysis suspicious suspicious for UTI. Right knee/lower extremity x-rays negative for fracture. Patient evaluated in emergency room, family at the bedside, patient complains of her chronic back pain only-no worse than previous, patient is now being admitted for acute urinary tract infection, acute on chronic systolic congestive heart failure, and acute on chronic respiratory failure. She is AOx4 at time of PT evaluation however some of her recollection of events prior to admission are different than what is found in medical record.   Clinical Impression  Pt admitted with above diagnosis. Pt currently with functional limitations due to the deficits listed below (see PT Problem List). When performing bed mobility increased time required, HOB elevated and use of bed rails. Pt requires multiple attempts to come to standing and requires minA+1 to stabilize due to imbalance. Once standing for extended time pt is able to stabilize with CGA only and UE support on rolling walker. Pt is able to ambulate to door and back to recliner. She moves slowly and is relatively unsteady but able to self correct with UE support on rolling walker. SaO2 drops to 85% on room air but rebounds to 93% within 15-20 seconds of rest on room air. She is currently unsafe and recommend SNF placement at discharge. Pt refuses as she wants to return home. Pt states that her husband  is not healthy. She has good coverage from private caregivers but would need extended hours if she continues to refuse SNF placement. Pt will benefit from PT services to address deficits in strength, balance, and mobility in order to return to full function at home.      Follow Up Recommendations SNF;Other (comment)(Pt currently refusing)    Equipment Recommendations  None recommended by PT    Recommendations for Other Services       Precautions / Restrictions Precautions Precautions: Fall Restrictions Weight Bearing Restrictions: No      Mobility  Bed Mobility Overal bed mobility: Modified Independent             General bed mobility comments: Increased time required, HOB elevated and use of bed rails  Transfers Overall transfer level: Needs assistance Equipment used: Rolling walker (2 wheeled) Transfers: Sit to/from Stand Sit to Stand: Min assist         General transfer comment: Pt requires multiple attempts to come to standing and requires minA+1 to stabilize due to imbalance. Once standing for extended time pt is able to stabilize with CGA only and UE support on rolling walker  Ambulation/Gait Ambulation/Gait assistance: Min guard Ambulation Distance (Feet): 20 Feet Assistive device: Rolling walker (2 wheeled)   Gait velocity: Decreased Gait velocity interpretation: <1.8 ft/sec, indicative of risk for recurrent falls General Gait Details: Pt is able to ambulate to door and back to recliner. She moves slowly and is relatively unsteady but able to self correct with UE support on rolling walker. SaO2 drops to 85% on room air but rebounds to 93% within 15-20 seconds  of rest on room air  Stairs            Wheelchair Mobility    Modified Rankin (Stroke Patients Only)       Balance Overall balance assessment: Needs assistance Sitting-balance support: No upper extremity supported Sitting balance-Leahy Scale: Good     Standing balance support:  Bilateral upper extremity supported Standing balance-Leahy Scale: Poor Standing balance comment: Poor initially but improves with extended time in standing                             Pertinent Vitals/Pain Pain Assessment: No/denies pain    Home Living Family/patient expects to be discharged to:: Private residence Living Arrangements: Spouse/significant other Available Help at Discharge: Family;Available PRN/intermittently;Personal care attendant(9-1, 6-10 7d/wk) Type of Home: House Home Access: Stairs to enter Entrance Stairs-Rails: Right Entrance Stairs-Number of Steps: 1 Home Layout: One level Home Equipment: Walker - 2 wheels;Shower seat Additional Comments: Denies BSC, record states she has a ramp at one entrance    Prior Function Level of Independence: Needs assistance   Gait / Transfers Assistance Needed: Ambulates household distances with rolling walker. Denies falls in the last 12 months  ADL's / Homemaking Assistance Needed: Independent with ADLs but requires assist from caregivers with IADLs. Pt states that her husband is also in poor health        Hand Dominance   Dominant Hand: Right    Extremity/Trunk Assessment   Upper Extremity Assessment Upper Extremity Assessment: Overall WFL for tasks assessed    Lower Extremity Assessment Lower Extremity Assessment: Generalized weakness       Communication   Communication: No difficulties  Cognition Arousal/Alertness: Awake/alert Behavior During Therapy: WFL for tasks assessed/performed Overall Cognitive Status: Within Functional Limits for tasks assessed                                 General Comments: Pt is AOx4 at time of PT evaluation however some of her history contradicts information in medical record      General Comments      Exercises General Exercises - Lower Extremity Ankle Circles/Pumps: Both;10 reps Quad Sets: Both;10 reps Gluteal Sets: Both;10 reps Long Arc  Quad: Both;10 reps Hip ABduction/ADduction: Both;10 reps Straight Leg Raises: Both;10 reps   Assessment/Plan    PT Assessment Patient needs continued PT services  PT Problem List Decreased strength;Decreased activity tolerance;Decreased balance;Decreased mobility       PT Treatment Interventions DME instruction;Gait training;Stair training;Functional mobility training;Therapeutic activities;Therapeutic exercise;Balance training;Neuromuscular re-education;Patient/family education    PT Goals (Current goals can be found in the Care Plan section)  Acute Rehab PT Goals Patient Stated Goal: Return to prior level of function at home PT Goal Formulation: With patient Time For Goal Achievement: 07/30/17 Potential to Achieve Goals: Fair    Frequency Min 2X/week   Barriers to discharge Decreased caregiver support      Co-evaluation               AM-PAC PT "6 Clicks" Daily Activity  Outcome Measure Difficulty turning over in bed (including adjusting bedclothes, sheets and blankets)?: A Little Difficulty moving from lying on back to sitting on the side of the bed? : A Little Difficulty sitting down on and standing up from a chair with arms (e.g., wheelchair, bedside commode, etc,.)?: Unable Help needed moving to and from a bed to chair (including a  wheelchair)?: A Little Help needed walking in hospital room?: A Little Help needed climbing 3-5 steps with a railing? : Total 6 Click Score: 14    End of Session Equipment Utilized During Treatment: Gait belt Activity Tolerance: Patient tolerated treatment well Patient left: in chair;with call bell/phone within reach;with chair alarm set Nurse Communication: Mobility status PT Visit Diagnosis: Unsteadiness on feet (R26.81);Muscle weakness (generalized) (M62.81)    Time: 6333-5456 PT Time Calculation (min) (ACUTE ONLY): 29 min   Charges:   PT Evaluation $PT Eval Low Complexity: 1 Low PT Treatments $Therapeutic Exercise: 8-22  mins   PT G Codes:        Lyndel Safe Kyndall Amero PT, DPT    [name] 07/16/2017, 10:07 AM

## 2017-07-17 LAB — GLUCOSE, CAPILLARY
Glucose-Capillary: 115 mg/dL — ABNORMAL HIGH (ref 65–99)
Glucose-Capillary: 227 mg/dL — ABNORMAL HIGH (ref 65–99)
Glucose-Capillary: 129 mg/dL — ABNORMAL HIGH (ref 65–99)
Glucose-Capillary: 183 mg/dL — ABNORMAL HIGH (ref 65–99)

## 2017-07-17 LAB — URINE CULTURE: Culture: >=100000 — AB

## 2017-07-17 LAB — POTASSIUM: Potassium: 3.8 mmol/L (ref 3.5–5.1)

## 2017-07-17 MED ORDER — FUROSEMIDE 20 MG PO TABS
20.0000 mg | ORAL_TABLET | Freq: Two times a day (BID) | ORAL | Status: DC
  Administered 2017-07-17 – 2017-07-19 (×4): 20 mg via ORAL
  Filled 2017-07-17 (×4): qty 1

## 2017-07-17 NOTE — Plan of Care (Signed)
  Progressing Education: Knowledge of General Education information will improve 07/17/2017 1948 - Progressing by Rowe Robert, RN Health Behavior/Discharge Planning: Ability to manage health-related needs will improve 07/17/2017 1948 - Progressing by Rowe Robert, RN Clinical Measurements: Ability to maintain clinical measurements within normal limits will improve 07/17/2017 1948 - Progressing by Rowe Robert, RN Will remain free from infection 07/17/2017 1948 - Progressing by Rowe Robert, RN Diagnostic test results will improve 07/17/2017 1948 - Progressing by Rowe Robert, RN Respiratory complications will improve 07/17/2017 1948 - Progressing by Rowe Robert, RN Cardiovascular complication will be avoided 07/17/2017 1948 - Progressing by Rowe Robert, RN Activity: Risk for activity intolerance will decrease 07/17/2017 1948 - Progressing by Rowe Robert, RN Nutrition: Adequate nutrition will be maintained 07/17/2017 1948 - Progressing by Rowe Robert, RN Coping: Level of anxiety will decrease 07/17/2017 1948 - Progressing by Rowe Robert, RN Elimination: Will not experience complications related to bowel motility 07/17/2017 1948 - Progressing by Rowe Robert, RN Will not experience complications related to urinary retention 07/17/2017 1948 - Progressing by Rowe Robert, RN Pain Managment: General experience of comfort will improve 07/17/2017 1948 - Progressing by Rowe Robert, RN Safety: Ability to remain free from injury will improve 07/17/2017 1948 - Progressing by Rowe Robert, RN Skin Integrity: Risk for impaired skin integrity will decrease 07/17/2017 1948 - Progressing by Rowe Robert, RN

## 2017-07-17 NOTE — Progress Notes (Signed)
Patient ID: [name], female   DOB: 04-12-1925, 84 y.o.   MRN: [id number]  [employer] PROGRESS NOTE  Dineen Conradt GEX:[id number] DOB: [birth year] DOA: 07/15/2017 PCP: Tracie Harrier, MD  HPI/Subjective: Patient feels little weak.  Not complaining of shortness of breath.  Objective: Vitals:   07/17/17 0949 07/17/17 1309  BP: (!) 131/55 (!) 126/59  Pulse: 68 77  Resp: 20 18  Temp: 98 F (36.7 C) 97.6 F (36.4 C)  SpO2: 97% 95%    Filed Weights   07/15/17 1402 07/17/17 0500  Weight: 79.4 kg (175 lb) 84.4 kg (186 lb 1.6 oz)    ROS: Review of Systems  Constitutional: Positive for malaise/fatigue. Negative for chills and fever.  Eyes: Negative for blurred vision.  Respiratory: Negative for cough and shortness of breath.   Cardiovascular: Negative for chest pain.  Gastrointestinal: Positive for nausea. Negative for abdominal pain, constipation, diarrhea and vomiting.  Genitourinary: Negative for dysuria.  Musculoskeletal: Negative for joint pain.  Neurological: Negative for dizziness and headaches.   Exam: Physical Exam  HENT:  Nose: No mucosal edema.  Mouth/Throat: No oropharyngeal exudate or posterior oropharyngeal edema.  Eyes: Conjunctivae, EOM and lids are normal. Pupils are equal, round, and reactive to light.  Neck: No JVD present. Carotid bruit is not present. No edema present. No thyroid mass and no thyromegaly present.  Cardiovascular: S1 normal and S2 normal. Exam reveals no gallop.  No murmur heard. Pulses:      Dorsalis pedis pulses are 2+ on the right side, and 2+ on the left side.  Respiratory: No respiratory distress. She has decreased breath sounds in the right lower field and the left lower field. She has no wheezes. She has no rhonchi. She has rales in the right lower field and the left lower field.  GI: Soft. Bowel sounds are normal. There is no tenderness.  Musculoskeletal:       Right ankle: She exhibits swelling.       Left  ankle: She exhibits swelling.  Lymphadenopathy:    She has no cervical adenopathy.  Neurological: She is alert. No cranial nerve deficit.  Skin: Skin is warm. Nails show no clubbing.  Psychiatric: She has a normal mood and affect.      Data Reviewed: Basic Metabolic Panel: Recent Labs  Lab 07/15/17 1357 07/16/17 0146 07/17/17 0647  NA 135 135  --   K 4.3 3.4* 3.8  CL 100* 97*  --   CO2 28 30  --   GLUCOSE 265* 172*  --   BUN 32* 35*  --   CREATININE 1.19* 1.28*  --   CALCIUM 8.7* 8.6*  --    Liver Function Tests: Recent Labs  Lab 07/15/17 1357  AST 30  ALT 16  ALKPHOS 63  BILITOT 1.3*  PROT 6.7  ALBUMIN 3.6   CBC: Recent Labs  Lab 07/15/17 1357 07/16/17 0146  WBC 15.1* 15.2*  NEUTROABS 13.4*  --   HGB 10.9* 9.9*  HCT 33.0* 30.7*  MCV 93.6 92.4  PLT 106* 103*   Cardiac Enzymes: Recent Labs  Lab 07/15/17 1357  TROPONINI 0.06*   BNP (last 3 results) Recent Labs    12/07/16 2038 12/13/16 0645 07/15/17 1357  BNP 537.0* 264.0* 880.0*   CBG: Recent Labs  Lab 07/16/17 1143 07/16/17 1659 07/16/17 2117 07/17/17 0732 07/17/17 1137  GLUCAP 102* 161* 238* 115* 227*    Recent Results (from the past 240 hour(s))  Blood Culture (routine x 2)  Status: Abnormal (Preliminary result)   Collection Time: 07/15/17  1:57 PM  Result Value Ref Range Status   Specimen Description   Final    BLOOD LEFT ANTECUBITAL Performed at [facility], [address] [place], [place] [address]    Special Requests   Final    BOTTLES DRAWN AEROBIC AND ANAEROBIC Blood Culture results may not be optimal due to an excessive volume of blood received in culture bottles Performed at [facility], [address] [place], [place] [address]    Culture  Setup Time   Final    GRAM POSITIVE COCCI ANAEROBIC BOTTLE ONLY CRITICAL RESULT CALLED TO, READ BACK BY AND VERIFIED WITH: MATT MCBANE AT 2992 ON 07/16/17 [place].    Culture (A)  Final    GROUP B  STREP(S.AGALACTIAE)ISOLATED SUSCEPTIBILITIES TO FOLLOW Performed at [facility] [address] [place], [place] [address]    Report Status PENDING  Incomplete  Blood Culture (routine x 2)     Status: None (Preliminary result)   Collection Time: 07/15/17  1:57 PM  Result Value Ref Range Status   Specimen Description BLOOD BLOOD LEFT HAND  Final   Special Requests   Final    BOTTLES DRAWN AEROBIC AND ANAEROBIC Blood Culture results may not be optimal due to an excessive volume of blood received in culture bottles   Culture   Final    NO GROWTH 2 DAYS Performed at [facility], [address], [place], [place] [address]    Report Status PENDING  Incomplete  Urine culture     Status: Abnormal   Collection Time: 07/15/17  1:57 PM  Result Value Ref Range Status   Specimen Description   Final    URINE, RANDOM Performed at [facility], [address] [place], [place] [address]    Special Requests   Final    NONE Performed at [facility], [address] [place], [place] [address]    Culture (A)  Final    >=100,000 COLONIES/mL GROUP B STREP(S.AGALACTIAE)ISOLATED TESTING AGAINST S. AGALACTIAE NOT ROUTINELY PERFORMED DUE TO PREDICTABILITY OF AMP/PEN/VAN SUSCEPTIBILITY. Performed at [facility], [place] [address] [place],  [address]    Report Status 07/17/2017 FINAL  Final  Blood Culture ID Panel (Reflexed)     Status: Abnormal   Collection Time: 07/15/17  1:57 PM  Result Value Ref Range Status   Enterococcus species NOT DETECTED NOT DETECTED Final   Listeria monocytogenes NOT DETECTED NOT DETECTED Final   Staphylococcus species NOT DETECTED NOT DETECTED Final   Staphylococcus aureus NOT DETECTED NOT DETECTED Final   Streptococcus species DETECTED (A) NOT DETECTED Final    Comment: CRITICAL RESULT CALLED TO, READ BACK BY AND VERIFIED WITH: MATT MCBANE AT 0552 ON 07/16/17 [place].    Streptococcus agalactiae DETECTED (A) NOT DETECTED  Final    Comment: CRITICAL RESULT CALLED TO, READ BACK BY AND VERIFIED WITH: MATT MCBANE AT 0814 ON 07/16/17 [place].    Streptococcus pneumoniae NOT DETECTED NOT DETECTED Final   Streptococcus pyogenes NOT DETECTED NOT DETECTED Final   Acinetobacter baumannii NOT DETECTED NOT DETECTED Final   Enterobacteriaceae species NOT DETECTED NOT DETECTED Final   Enterobacter cloacae complex NOT DETECTED NOT DETECTED Final   Escherichia coli NOT DETECTED NOT DETECTED Final   Klebsiella oxytoca NOT DETECTED NOT DETECTED Final   Klebsiella pneumoniae NOT DETECTED NOT DETECTED Final   Proteus species NOT DETECTED NOT DETECTED Final   Serratia marcescens NOT  DETECTED NOT DETECTED Final   Haemophilus influenzae NOT DETECTED NOT DETECTED Final   Neisseria meningitidis NOT DETECTED NOT DETECTED Final   Pseudomonas aeruginosa NOT DETECTED NOT DETECTED Final   Candida albicans NOT DETECTED NOT DETECTED Final   Candida glabrata NOT DETECTED NOT DETECTED Final   Candida krusei NOT DETECTED NOT DETECTED Final   Candida parapsilosis NOT DETECTED NOT DETECTED Final   Candida tropicalis NOT DETECTED NOT DETECTED Final    Comment: Performed at [facility], [address] [place], [place] [address]      Scheduled Meds: . aspirin EC  81 mg Oral Daily  . baclofen  10 mg Oral TID  . budesonide (PULMICORT) nebulizer solution  0.5 mg Nebulization BID  . carvedilol  6.25 mg Oral BID WC  . cholecalciferol  1,000 Units Oral Daily  . docusate sodium  100 mg Oral BID  . escitalopram  10 mg Oral Daily  . ferrous sulfate  325 mg Oral Q breakfast  . furosemide  20 mg Oral Daily  . gabapentin  100 mg Oral QHS  . GLUCERNA  237 mL Oral BID BM  . insulin aspart  0-15 Units Subcutaneous TID WC  . insulin aspart  0-5 Units Subcutaneous QHS  . magnesium oxide  400 mg Oral Daily  . Melatonin  2.5 mg Oral QHS  . multivitamin with minerals  1 tablet Oral Daily  . pantoprazole  40 mg Oral Daily  . potassium chloride   20 mEq Oral BID  . Rivaroxaban  15 mg Oral Daily  . rOPINIRole  0.5 mg Oral BID  . senna  1 tablet Oral BID  . sodium chloride flush  3 mL Intravenous Q12H  . spironolactone  25 mg Oral Daily  . vitamin B-12  1,000 mcg Oral Daily  . vitamin C  500 mg Oral Daily   Continuous Infusions: . sodium chloride    . piperacillin-tazobactam (ZOSYN)  IV Stopped (07/17/17 1004)    Assessment/Plan:  1. Clinical sepsis present on admission.  Strep agalactiae in the blood cultures and urine cultures.  Follow-up blood cultures for sensitivities.  Will get infectious disease consultation and echocardiogram.  Switched antibiotics to Zosyn since she did have this in the past.  Patient's hypotension has resolved 2. Acute on chronic diastolic congestive heart failure.  Stop IV fluids and continue Lasix but increase to twice a day dosing 3. Type 2 diabetes mellitus on sliding scale 4. COPD.  Continue inhalers 5. Peripheral vascular disease on Xarelto and aspirin 6. Weakness.  Physical therapy recommended rehab but the patient would like to go home  Code Status:     Code Status Orders  (From admission, onward)        Start     Ordered   07/15/17 1830  Do not attempt resuscitation (DNR)  Continuous    Question Answer Comment  In the event of cardiac or respiratory ARREST Do not call a "code blue"   In the event of cardiac or respiratory ARREST Do not perform Intubation, CPR, defibrillation or ACLS   In the event of cardiac or respiratory ARREST Use medication by any route, position, wound care, and other measures to relive pain and suffering. May use oxygen, suction and manual treatment of airway obstruction as needed for comfort.      07/15/17 1830    Code Status History    Date Active Date Inactive Code Status Order ID Comments User Context   04/14/2017 03:44 04/18/2017 21:38 Full  Code [id number]  Lance Coon, MD Inpatient   12/08/2016 01:07 12/09/2016 20:31 Full Code [id number]  Lance Coon,  MD ED   09/07/2015 04:41 09/13/2015 19:55 Full Code [id number]  Saundra Shelling, MD Inpatient   09/01/2015 15:33 09/03/2015 19:07 Full Code [id number]  Epifanio Lesches, MD ED   03/21/2015 06:26 03/22/2015 19:39 Full Code [id number]  Harrie Foreman, MD Inpatient    Advance Directive Documentation     Most Recent Value  Type of Advance Directive  Healthcare Power of Attorney, Living will  Pre-existing out of facility DNR order (yellow form or pink MOST form)  No data  "MOST" Form in Place?  No data     Family Communication: Spoke with husband at the bedside Disposition Plan: To be determined  Antibiotics:  Zosyn  Time spent: 25 minutes  [place]

## 2017-07-17 NOTE — Care Management Important Message (Signed)
Important Message  Patient Details  [name] MRN: [id number] Date of Birth: [birth year]   Medicare Important Message Given:  Yes    Shelbie Ammons, RN 07/17/2017, 7:05 AM

## 2017-07-17 NOTE — Consult Note (Signed)
[facility] Infectious Disease     Reason for Consult: Grp B strep UTI and bacteremia, sepsis   Referring Physician: Karlton Lemon Date of Admission:  07/15/2017   Active Problems:   UTI (urinary tract infection)   HPI: [name] is a 84 y.o. female admitted with fall, SOB and found to have wbc 15 K temp 101/6 and sepsis with UTI noted on UA. Started on IV vanco and zosyn,. UCX and bcx + Grp B strep. Also noted to be in acute on chronic CHF.  Multiple allergies including cephalosporins and ciprofloxacin.  Her fevers have resolved  But still feels "terrible". Denies dysuria. No skin or soft tissue infection.s  Past Medical History:  Diagnosis Date  . Cancer ([place])    skin ca  . CHF (congestive heart failure) ([place])   . Chronic back pain   . COPD (chronic obstructive pulmonary disease) ([place])   . Diabetes mellitus without complication ([place])   . Hypertension   . Poor perfusion of leg    Past Surgical History:  Procedure Laterality Date  . ABDOMINAL HYSTERECTOMY    . BACK SURGERY     x3  . CORONARY STENT PLACEMENT     unknown location per pt  . FOOT SURGERY    . LEG SURGERY    . PACEMAKER PLACEMENT    . TONSILLECTOMY     Social History   Tobacco Use  . Smoking status: Never Smoker  . Smokeless tobacco: Never Used  Substance Use Topics  . Alcohol use: No    Alcohol/week: 0.0 oz  . Drug use: No   Family History  Problem Relation Age of Onset  . CVA Mother        Congestive Heart Failure, heart attack, hypertension, stroke  . Diabetes Mellitus II Sister   . Heart disease Father        Congestive Heart Failure, heart attack, hypertension    Allergies:  Allergies  Allergen Reactions  . Ceftriaxone Swelling  . Cefuroxime Hives  . Cephalosporins Swelling and Other (See Comments)    Reaction:  Fainting/dry mouth   . Codeine Nausea And Vomiting  . Epinephrine Other (See Comments)    Reaction:  Fainting   . Morphine And Related Other (See Comments)     Reaction:  GI upset   . Buprenorphine Hcl Nausea And Vomiting  . Ciprofloxacin Itching, Swelling and Rash  . Latex Rash  . Lidocaine Rash  . Procaine Rash    Current antibiotics: Antibiotics Given (last 72 hours)    Date/Time Action Medication Dose Rate   07/15/17 1631 New Bag/Given   levofloxacin (LEVAQUIN) IVPB 750 mg 750 mg 100 mL/hr   07/16/17 0709 New Bag/Given   vancomycin (VANCOCIN) 1,250 mg in sodium chloride 0.9 % 250 mL IVPB 1,250 mg 166.7 mL/hr   07/16/17 1416 New Bag/Given   piperacillin-tazobactam (ZOSYN) IVPB 3.375 g 3.375 g 12.5 mL/hr   07/16/17 2146 New Bag/Given   piperacillin-tazobactam (ZOSYN) IVPB 3.375 g 3.375 g 12.5 mL/hr   07/17/17 0635 New Bag/Given   piperacillin-tazobactam (ZOSYN) IVPB 3.375 g 3.375 g 12.5 mL/hr      MEDICATIONS: . aspirin EC  81 mg Oral Daily  . baclofen  10 mg Oral TID  . budesonide (PULMICORT) nebulizer solution  0.5 mg Nebulization BID  . carvedilol  6.25 mg Oral BID WC  . cholecalciferol  1,000 Units Oral Daily  . docusate sodium  100 mg Oral BID  . escitalopram  10 mg Oral Daily  .  ferrous sulfate  325 mg Oral Q breakfast  . furosemide  20 mg Oral Daily  . gabapentin  100 mg Oral QHS  . GLUCERNA  237 mL Oral BID BM  . insulin aspart  0-15 Units Subcutaneous TID WC  . insulin aspart  0-5 Units Subcutaneous QHS  . magnesium oxide  400 mg Oral Daily  . Melatonin  2.5 mg Oral QHS  . multivitamin with minerals  1 tablet Oral Daily  . pantoprazole  40 mg Oral Daily  . potassium chloride  20 mEq Oral BID  . Rivaroxaban  15 mg Oral Daily  . rOPINIRole  0.5 mg Oral BID  . senna  1 tablet Oral BID  . sodium chloride flush  3 mL Intravenous Q12H  . spironolactone  25 mg Oral Daily  . vitamin B-12  1,000 mcg Oral Daily  . vitamin C  500 mg Oral Daily    Review of Systems - 11 systems reviewed and negative per HPI   OBJECTIVE: Temp:  [97.6 F (36.4 C)-98 F (36.7 C)] 97.6 F (36.4 C) (01/24 1309) Pulse Rate:  [68-77] 77  (01/24 1309) Resp:  [18-20] 18 (01/24 1309) BP: (122-131)/(49-59) 126/59 (01/24 1309) SpO2:  [95 %-97 %] 95 % (01/24 1309) Weight:  [84.4 kg (186 lb 1.6 oz)] 84.4 kg (186 lb 1.6 oz) (01/24 0500) Physical Exam  Constitutional:  oriented to person, place, and time. Frail, NAD HENT: [place]/AT, PERRLA, no scleral icterus Mouth/Throat: Oropharynx is clear and moist. No oropharyngeal exudate.  Cardiovascular: Normal rate, regular rhythm2/6 sm Pulmonary/Chest:mild bibasilar crackles  Neck = supple, no nuchal rigidity Abdominal: Soft. Bowel sounds are normal.  exhibits no distension. There is no tenderness.  Lymphadenopathy: no cervical adenopathy. No axillary adenopathy Neurological: alert and oriented to person, place, and time.  Ext 1+ edema bil  Skin: Skin is warm and dry. L plantar surface foot with dried callus Psychiatric: a normal mood and affect.  behavior is normal.    LABS: Results for orders placed or performed during the hospital encounter of 07/15/17 (from the past 48 hour(s))  Lactic acid, plasma     Status: Abnormal   Collection Time: 07/15/17  6:09 PM  Result Value Ref Range   Lactic Acid, Venous 3.1 (HH) 0.5 - 1.9 mmol/L    Comment: CRITICAL RESULT CALLED TO, READ BACK BY AND VERIFIED WITH KIM MYTHE 07/15/17 1856 KLW Performed at [facility], [address] [place], [place] [address]   TSH     Status: None   Collection Time: 07/15/17  7:52 PM  Result Value Ref Range   TSH 1.061 0.350 - 4.500 uIU/mL    Comment: Performed by a 3rd Generation assay with a functional sensitivity of <=0.01 uIU/mL. Performed at [facility], [address] [place], [place] [address]   Glucose, capillary     Status: Abnormal   Collection Time: 07/15/17  9:24 PM  Result Value Ref Range   Glucose-Capillary 233 (H) 65 - 99 mg/dL   Comment 1 Notify RN   Lactic acid, plasma     Status: None   Collection Time: 07/15/17 11:27 PM  Result Value Ref Range   Lactic Acid, Venous  1.1 0.5 - 1.9 mmol/L    Comment: Performed at [facility], [address], [place], [place] [address]  Basic metabolic panel     Status: Abnormal   Collection Time: 07/16/17  1:46 AM  Result Value Ref Range   Sodium 135 135 - 145  mmol/L   Potassium 3.4 (L) 3.5 - 5.1 mmol/L   Chloride 97 (L) 101 - 111 mmol/L   CO2 30 22 - 32 mmol/L   Glucose, Bld 172 (H) 65 - 99 mg/dL   BUN 35 (H) 6 - 20 mg/dL   Creatinine, Ser 1.28 (H) 0.44 - 1.00 mg/dL   Calcium 8.6 (L) 8.9 - 10.3 mg/dL   GFR calc non Af Amer 36 (L) >60 mL/min   GFR calc Af Amer 41 (L) >60 mL/min    Comment: (NOTE) The eGFR has been calculated using the CKD EPI equation. This calculation has not been validated in all clinical situations. eGFR's persistently <60 mL/min signify possible Chronic Kidney Disease.    Anion gap 8 5 - 15    Comment: Performed at [facility], [address] [place], [place] [address]  CBC     Status: Abnormal   Collection Time: 07/16/17  1:46 AM  Result Value Ref Range   WBC 15.2 (H) 3.6 - 11.0 K/uL   RBC 3.32 (L) 3.80 - 5.20 MIL/uL   Hemoglobin 9.9 (L) 12.0 - 16.0 g/dL   HCT 30.7 (L) 35.0 - 47.0 %   MCV 92.4 80.0 - 100.0 fL   MCH 29.7 26.0 - 34.0 pg   MCHC 32.1 32.0 - 36.0 g/dL   RDW 15.1 (H) 11.5 - 14.5 %   Platelets 103 (L) 150 - 440 K/uL    Comment: Performed at [facility], [address], [place], [place] [address]  Lactic acid, plasma     Status: None   Collection Time: 07/16/17  1:46 AM  Result Value Ref Range   Lactic Acid, Venous 1.1 0.5 - 1.9 mmol/L    Comment: Performed at [facility], [address] [place], [place] [address]  Glucose, capillary     Status: None   Collection Time: 07/16/17  7:57 AM  Result Value Ref Range   Glucose-Capillary 98 65 - 99 mg/dL  Glucose, capillary     Status: Abnormal   Collection Time: 07/16/17 11:43 AM  Result Value Ref Range   Glucose-Capillary 102 (H) 65 - 99 mg/dL  Glucose, capillary     Status:  Abnormal   Collection Time: 07/16/17  4:59 PM  Result Value Ref Range   Glucose-Capillary 161 (H) 65 - 99 mg/dL  Glucose, capillary     Status: Abnormal   Collection Time: 07/16/17  9:17 PM  Result Value Ref Range   Glucose-Capillary 238 (H) 65 - 99 mg/dL  Potassium     Status: None   Collection Time: 07/17/17  6:47 AM  Result Value Ref Range   Potassium 3.8 3.5 - 5.1 mmol/L    Comment: Performed at [facility], [address] [place],  [address]  Glucose, capillary     Status: Abnormal   Collection Time: 07/17/17  7:32 AM  Result Value Ref Range   Glucose-Capillary 115 (H) 65 - 99 mg/dL  Glucose, capillary     Status: Abnormal   Collection Time: 07/17/17 11:37 AM  Result Value Ref Range   Glucose-Capillary 227 (H) 65 - 99 mg/dL   No components found for: ESR, C REACTIVE PROTEIN MICRO: Recent Results (from the past 720 hour(s))  Blood Culture (routine x 2)     Status: Abnormal (Preliminary result)   Collection Time: 07/15/17  1:57 PM  Result Value Ref Range Status   Specimen Description   Final    BLOOD LEFT ANTECUBITAL Performed at [facility], [address], [place], [place] [address]    Special Requests   Final    BOTTLES DRAWN AEROBIC AND ANAEROBIC Blood Culture results may not be optimal due to an excessive volume of blood received in culture bottles Performed at [facility], [address] [place], [place] [address]    Culture  Setup Time   Final    GRAM POSITIVE COCCI ANAEROBIC BOTTLE ONLY CRITICAL RESULT CALLED TO, READ BACK BY AND VERIFIED WITH: MATT MCBANE AT 0552 ON 07/16/17 [place].    Culture (A)  Final    GROUP B STREP(S.AGALACTIAE)ISOLATED SUSCEPTIBILITIES TO FOLLOW Performed at [facility] [address], [place], [place] [address]    Report Status PENDING  Incomplete  Blood Culture (routine x 2)     Status: None (Preliminary result)   Collection Time: 07/15/17  1:57 PM  Result Value Ref Range Status    Specimen Description BLOOD BLOOD LEFT HAND  Final   Special Requests   Final    BOTTLES DRAWN AEROBIC AND ANAEROBIC Blood Culture results may not be optimal due to an excessive volume of blood received in culture bottles   Culture   Final    NO GROWTH 2 DAYS Performed at [facility], [address] [place], [place] [address]    Report Status PENDING  Incomplete  Urine culture     Status: Abnormal   Collection Time: 07/15/17  1:57 PM  Result Value Ref Range Status   Specimen Description   Final    URINE, RANDOM Performed at [facility], [address], [place], [place] [address]    Special Requests   Final    NONE Performed at [facility], [address] [place], [place] [address]    Culture (A)  Final    >=100,000 COLONIES/mL GROUP B STREP(S.AGALACTIAE)ISOLATED TESTING AGAINST S. AGALACTIAE NOT ROUTINELY PERFORMED DUE TO PREDICTABILITY OF AMP/PEN/VAN SUSCEPTIBILITY. Performed at [facility], [place] [address], [place], [place] [address]    Report Status 07/17/2017 FINAL  Final  Blood Culture ID Panel (Reflexed)     Status: Abnormal   Collection Time: 07/15/17  1:57 PM  Result Value Ref Range Status   Enterococcus species NOT DETECTED NOT DETECTED Final   Listeria monocytogenes NOT DETECTED NOT DETECTED Final   Staphylococcus species NOT DETECTED NOT DETECTED Final   Staphylococcus aureus NOT DETECTED NOT DETECTED Final   Streptococcus species DETECTED (A) NOT DETECTED Final    Comment: CRITICAL RESULT CALLED TO, READ BACK BY AND VERIFIED WITH: MATT MCBANE AT 0552 ON 07/16/17 [place].    Streptococcus agalactiae DETECTED (A) NOT DETECTED Final    Comment: CRITICAL RESULT CALLED TO, READ BACK BY AND VERIFIED WITH: MATT MCBANE AT 4166 ON 07/16/17 [place].    Streptococcus pneumoniae NOT DETECTED NOT DETECTED Final   Streptococcus pyogenes NOT DETECTED NOT DETECTED Final   Acinetobacter baumannii NOT DETECTED NOT DETECTED Final   Enterobacteriaceae  species NOT DETECTED NOT DETECTED Final   Enterobacter cloacae complex NOT DETECTED NOT DETECTED Final   Escherichia coli NOT DETECTED NOT DETECTED Final   Klebsiella oxytoca NOT DETECTED NOT DETECTED Final   Klebsiella pneumoniae NOT DETECTED NOT DETECTED Final   Proteus species NOT DETECTED NOT DETECTED Final   Serratia marcescens NOT DETECTED NOT DETECTED Final   Haemophilus influenzae NOT DETECTED NOT DETECTED Final   Neisseria meningitidis NOT DETECTED NOT DETECTED Final   Pseudomonas aeruginosa NOT DETECTED NOT DETECTED Final   Candida  albicans NOT DETECTED NOT DETECTED Final   Candida glabrata NOT DETECTED NOT DETECTED Final   Candida krusei NOT DETECTED NOT DETECTED Final   Candida parapsilosis NOT DETECTED NOT DETECTED Final   Candida tropicalis NOT DETECTED NOT DETECTED Final    Comment: Performed at [facility], [address], [place], [place] [address]    IMAGING: Dg Chest 1 View  Result Date: 07/15/2017 CLINICAL DATA:  Unwitnessed fall. Increasing shortness of breath with generalized weakness. History of CHF, COPD. EXAM: CHEST 1 VIEW COMPARISON:  Chest radiograph April 16, 2017 FINDINGS: Cardiac silhouette is mildly enlarged and unchanged. Calcified aortic knob. Pulmonary vascular congestion. Diffuse interstitial prominence without pleural effusion or focal consolidation. Biapical pleural thickening. No pneumothorax. Dual lead LEFT cardiac pacemaker in situ. Thoracolumbar spine hardware. Soft tissue planes are non suspicious. IMPRESSION: Mild cardiomegaly. Interstitial prominence most compatible with pulmonary edema. Aortic Atherosclerosis (ICD10-I70.0). Electronically Signed   By: Elon Alas M.D.   On: 07/15/2017 14:36   Dg Knee 2 Views Right  Result Date: 07/15/2017 CLINICAL DATA:  84 year old female with unwitnessed fall, right knee pain EXAM: RIGHT KNEE - 1-2 VIEW COMPARISON:  Concurrently obtained radiographs of the right tib-fib FINDINGS: No evidence  of acute fracture or malalignment. Medial compartment predominant osteoarthritis with significant loss of joint space height and bone-on-bone contact. Atherosclerotic calcifications present within the visualized vessels. No focal soft tissue abnormality or joint effusion. IMPRESSION: 1. Medial compartment osteoarthritis with near complete loss of joint space height and bone-on-bone contact. 2. No evidence of fracture or malalignment. 3. Atherosclerotic vascular calcifications. Electronically Signed   By: Jacqulynn Cadet M.D.   On: 07/15/2017 14:37   Dg Tibia/fibula Right  Result Date: 07/15/2017 CLINICAL DATA:  84 year old female status post unwitnessed fall with right leg pain EXAM: RIGHT TIBIA AND FIBULA - 2 VIEW COMPARISON:  Concurrently obtained radiographs of the right knee FINDINGS: No evidence of acute fracture or malalignment. Surgical changes of prior calcaneal talar fusion with cannulated lag screws. No evidence of hardware complication. Medial compartment predominant osteoarthritis of the knee. Atherosclerotic vascular calcifications present throughout the posterior tibial artery. IMPRESSION: 1. No acute fracture or malalignment. 2. ORIF of prior calcaneal talar fusion. 3. Atherosclerotic vascular calcifications. Electronically Signed   By: Jacqulynn Cadet M.D.   On: 07/15/2017 14:39    Assessment:   [name] is a 84 y.o. female admitted with CHF exacerbation likely precipitated by Group B strep bacteremia from urinary source. She is allergic to ceftriaxone and cipro but is tolerating zosyn. Clinically improving. Sensitivities pending. Echo pending. She had admission in Oct with E coli bacteremia but no obvious source. UCX not done that admission and UA was negative  Recommendations Usually GB strep is sensitive to penicillins and if so then she can be dced on oral PCN 500 QID for a total abx duration of 14 days.  As otpt may benefit from urology evaluation if UTIs recur.   Thank you very much for allowing me to participate in the care of this patient. Please call with questions.   Cheral Marker. Ola Spurr, MD

## 2017-07-18 ENCOUNTER — Inpatient Hospital Stay
Admit: 2017-07-18 | Discharge: 2017-07-18 | Disposition: A | Discharge status: Home / Self Care | Payer: Medicare Other | Attending: Internal Medicine | Admitting: Internal Medicine

## 2017-07-18 LAB — BASIC METABOLIC PANEL
ANION GAP: 8 (ref 5–15)
BUN: 34 mg/dL — ABNORMAL HIGH (ref 6–20)
CALCIUM: 8.8 mg/dL — AB (ref 8.9–10.3)
CO2: 30 mmol/L (ref 22–32)
Chloride: 100 mmol/L — ABNORMAL LOW (ref 101–111)
Creatinine, Ser: 1.21 mg/dL — ABNORMAL HIGH (ref 0.44–1.00)
GFR, EST AFRICAN AMERICAN: 44 mL/min — AB (ref >60)
GFR, EST NON AFRICAN AMERICAN: 38 mL/min — AB (ref >60)
Glucose, Bld: 153 mg/dL — ABNORMAL HIGH (ref 65–99)
Potassium: 4.2 mmol/L (ref 3.5–5.1)
Sodium: 138 mmol/L (ref 135–145)

## 2017-07-18 LAB — GLUCOSE, CAPILLARY
GLUCOSE-CAPILLARY: 141 mg/dL — AB (ref 65–99)
GLUCOSE-CAPILLARY: 178 mg/dL — AB (ref 65–99)
Glucose-Capillary: 156 mg/dL — ABNORMAL HIGH (ref 65–99)
Glucose-Capillary: 223 mg/dL — ABNORMAL HIGH (ref 65–99)

## 2017-07-18 LAB — CBC
HCT: 31.2 % — ABNORMAL LOW (ref 35.0–47.0)
HEMOGLOBIN: 10.1 g/dL — AB (ref 12.0–16.0)
MCH: 30 pg (ref 26.0–34.0)
MCHC: 32.2 g/dL (ref 32.0–36.0)
MCV: 93.3 fL (ref 80.0–100.0)
Platelets: 110 10*3/uL — ABNORMAL LOW (ref 150–440)
RBC: 3.35 MIL/uL — AB (ref 3.80–5.20)
RDW: 15 % — ABNORMAL HIGH (ref 11.5–14.5)
WBC: 6.9 10*3/uL (ref 3.6–11.0)

## 2017-07-18 LAB — CULTURE, BLOOD (ROUTINE X 2)

## 2017-07-18 MED ORDER — PENICILLIN G POTASSIUM 5000000 UNITS IJ SOLR
4.0000 10*6.[IU] | INTRAVENOUS | Status: DC
  Administered 2017-07-18 – 2017-07-19 (×3): 4 10*6.[IU] via INTRAVENOUS
  Filled 2017-07-18 (×8): qty 4

## 2017-07-18 MED ORDER — PIPERACILLIN-TAZOBACTAM 3.375 G IVPB: 3.3750 g | Freq: Three times a day (TID) | INTRAVENOUS | Status: DC

## 2017-07-18 MED ORDER — FLORANEX PO PACK
1.0000 g | PACK | Freq: Three times a day (TID) | ORAL | Status: DC
  Administered 2017-07-18 – 2017-07-19 (×4): 1 g via ORAL
  Filled 2017-07-18 (×5): qty 1

## 2017-07-18 MED ORDER — SODIUM CHLORIDE 0.9 % IV SOLN: 2.0000 g | Freq: Four times a day (QID) | INTRAVENOUS | Status: DC

## 2017-07-18 MED ORDER — LOPERAMIDE HCL 2 MG PO CAPS
2.0000 mg | ORAL_CAPSULE | Freq: Three times a day (TID) | ORAL | Status: DC | PRN
  Administered 2017-07-18: 11:00:00 2 mg via ORAL
  Filled 2017-07-18: qty 1

## 2017-07-18 MED ORDER — PIPERACILLIN-TAZOBACTAM 3.375 G IVPB
3.3750 g | Freq: Three times a day (TID) | INTRAVENOUS | Status: DC
  Administered 2017-07-18: 17:00:00 3.375 g via INTRAVENOUS
  Filled 2017-07-18: qty 50

## 2017-07-18 NOTE — Progress Notes (Signed)
Patient ID: [name], female   DOB: [birth year], 84 y.o.   MRN: [id number]  [employer] PROGRESS NOTE  Wilma Michaelson BMW:[id number] DOB: 1924-12-13 DOA: 07/15/2017 PCP: Tracie Harrier, MD  HPI/Subjective: Patient is reporting diarrhea with antibiotic, being weak. Objective: Vitals:   07/18/17 0434 07/18/17 0852  BP: (!) 111/59 (!) 132/57  Pulse: 70 74  Resp: 18 16  Temp: (!) 97.5 F (36.4 C) 98.5 F (36.9 C)  SpO2: 100% 92%    Filed Weights   07/15/17 1402 07/17/17 0500 07/18/17 0434  Weight: 79.4 kg (175 lb) 84.4 kg (186 lb 1.6 oz) 84 kg (185 lb 3.2 oz)    ROS: Review of Systems  Constitutional: Positive for malaise/fatigue. Negative for chills and fever.  Eyes: Negative for blurred vision.  Respiratory: Negative for cough and shortness of breath.   Cardiovascular: Negative for chest pain.  Gastrointestinal: Negative for abdominal pain, constipation, nausea and vomiting.  Genitourinary: Negative for dysuria.  Musculoskeletal: Negative for joint pain.  Neurological: Negative for dizziness and headaches.   Exam: Physical Exam  HENT:  Nose: No mucosal edema.  Mouth/Throat: No oropharyngeal exudate or posterior oropharyngeal edema.  Eyes: Conjunctivae, EOM and lids are normal. Pupils are equal, round, and reactive to light.  Neck: No JVD present. Carotid bruit is not present. No edema present. No thyroid mass and no thyromegaly present.  Cardiovascular: S1 normal and S2 normal. Exam reveals no gallop.  No murmur heard. Pulses:      Dorsalis pedis pulses are 2+ on the right side, and 2+ on the left side.  Respiratory: No respiratory distress. She has decreased breath sounds in the right lower field and the left lower field. She has no wheezes. She has no rhonchi. She has no rales.  GI: Soft. Bowel sounds are normal. There is no tenderness.  Musculoskeletal:       Right ankle: She exhibits swelling.       Left ankle: She exhibits swelling.   Lymphadenopathy:    She has no cervical adenopathy.  Neurological: She is alert. No cranial nerve deficit.  Skin: Skin is warm. Nails show no clubbing.  Psychiatric: She has a normal mood and affect.      Data Reviewed: Basic Metabolic Panel: Recent Labs  Lab 07/15/17 1357 07/16/17 0146 07/17/17 0647 07/18/17 0523  NA 135 135  --  138  K 4.3 3.4* 3.8 4.2  CL 100* 97*  --  100*  CO2 28 30  --  30  GLUCOSE 265* 172*  --  153*  BUN 32* 35*  --  34*  CREATININE 1.19* 1.28*  --  1.21*  CALCIUM 8.7* 8.6*  --  8.8*   Liver Function Tests: Recent Labs  Lab 07/15/17 1357  AST 30  ALT 16  ALKPHOS 63  BILITOT 1.3*  PROT 6.7  ALBUMIN 3.6   CBC: Recent Labs  Lab 07/15/17 1357 07/16/17 0146 07/18/17 0523  WBC 15.1* 15.2* 6.9  NEUTROABS 13.4*  --   --   HGB 10.9* 9.9* 10.1*  HCT 33.0* 30.7* 31.2*  MCV 93.6 92.4 93.3  PLT 106* 103* 110*   Cardiac Enzymes: Recent Labs  Lab 07/15/17 1357  TROPONINI 0.06*   BNP (last 3 results) Recent Labs    12/07/16 2038 12/13/16 0645 07/15/17 1357  BNP 537.0* 264.0* 880.0*   CBG: Recent Labs  Lab 07/17/17 1137 07/17/17 1658 07/17/17 2026 07/18/17 0738 07/18/17 1135  GLUCAP 227* 129* 183* 141* 178*    Recent  Results (from the past 240 hour(s))  Blood Culture (routine x 2)     Status: Abnormal   Collection Time: 07/15/17  1:57 PM  Result Value Ref Range Status   Specimen Description   Final    BLOOD LEFT ANTECUBITAL Performed at [facility], [address], [place], [place] [address]    Special Requests   Final    BOTTLES DRAWN AEROBIC AND ANAEROBIC Blood Culture results may not be optimal due to an excessive volume of blood received in culture bottles Performed at [facility], [address] [place], [place] [address]    Culture  Setup Time   Final    GRAM POSITIVE COCCI ANAEROBIC BOTTLE ONLY CRITICAL RESULT CALLED TO, READ BACK BY AND VERIFIED WITH: MATT MCBANE AT 3875 ON 07/16/17  [place].    Culture GROUP B STREP(S.AGALACTIAE)ISOLATED (A)  Final   Report Status 07/18/2017 FINAL  Final   Organism ID, Bacteria GROUP B STREP(S.AGALACTIAE)ISOLATED  Final      Susceptibility   Group b strep(s.agalactiae)isolated - MIC*    CLINDAMYCIN <=0.25 SENSITIVE Sensitive     AMPICILLIN <=0.25 SENSITIVE Sensitive     ERYTHROMYCIN <=0.12 SENSITIVE Sensitive     VANCOMYCIN 0.5 SENSITIVE Sensitive     CEFTRIAXONE <=0.12 SENSITIVE Sensitive     LEVOFLOXACIN 1 SENSITIVE Sensitive     PENICILLIN Value in next row Sensitive      SENSITIVE0.12    * GROUP B STREP(S.AGALACTIAE)ISOLATED  Blood Culture (routine x 2)     Status: None (Preliminary result)   Collection Time: 07/15/17  1:57 PM  Result Value Ref Range Status   Specimen Description BLOOD BLOOD LEFT HAND  Final   Special Requests   Final    BOTTLES DRAWN AEROBIC AND ANAEROBIC Blood Culture results may not be optimal due to an excessive volume of blood received in culture bottles   Culture   Final    NO GROWTH 3 DAYS Performed at [facility], [address], [place], [place] [address]    Report Status PENDING  Incomplete  Urine culture     Status: Abnormal   Collection Time: 07/15/17  1:57 PM  Result Value Ref Range Status   Specimen Description   Final    URINE, RANDOM Performed at [facility], [address], [place], [place] [address]    Special Requests   Final    NONE Performed at [facility], [address], [place], [place] [address]    Culture (A)  Final    >=100,000 COLONIES/mL GROUP B STREP(S.AGALACTIAE)ISOLATED TESTING AGAINST S. AGALACTIAE NOT ROUTINELY PERFORMED DUE TO PREDICTABILITY OF AMP/PEN/VAN SUSCEPTIBILITY. Performed at [facility], [place] [address], [place], [place] [address]    Report Status 07/17/2017 FINAL  Final  Blood Culture ID Panel (Reflexed)     Status: Abnormal   Collection Time: 07/15/17  1:57 PM  Result Value Ref Range Status   Enterococcus species  NOT DETECTED NOT DETECTED Final   Listeria monocytogenes NOT DETECTED NOT DETECTED Final   Staphylococcus species NOT DETECTED NOT DETECTED Final   Staphylococcus aureus NOT DETECTED NOT DETECTED Final   Streptococcus species DETECTED (A) NOT DETECTED Final    Comment: CRITICAL RESULT CALLED TO, READ BACK BY AND VERIFIED WITH: MATT MCBANE AT 0552 ON 07/16/17 [place].    Streptococcus agalactiae DETECTED (A) NOT DETECTED Final    Comment: CRITICAL RESULT CALLED TO, READ BACK BY AND VERIFIED WITH: MATT MCBANE AT 0109 ON 07/16/17 [place].  Streptococcus pneumoniae NOT DETECTED NOT DETECTED Final   Streptococcus pyogenes NOT DETECTED NOT DETECTED Final   Acinetobacter baumannii NOT DETECTED NOT DETECTED Final   Enterobacteriaceae species NOT DETECTED NOT DETECTED Final   Enterobacter cloacae complex NOT DETECTED NOT DETECTED Final   Escherichia coli NOT DETECTED NOT DETECTED Final   Klebsiella oxytoca NOT DETECTED NOT DETECTED Final   Klebsiella pneumoniae NOT DETECTED NOT DETECTED Final   Proteus species NOT DETECTED NOT DETECTED Final   Serratia marcescens NOT DETECTED NOT DETECTED Final   Haemophilus influenzae NOT DETECTED NOT DETECTED Final   Neisseria meningitidis NOT DETECTED NOT DETECTED Final   Pseudomonas aeruginosa NOT DETECTED NOT DETECTED Final   Candida albicans NOT DETECTED NOT DETECTED Final   Candida glabrata NOT DETECTED NOT DETECTED Final   Candida krusei NOT DETECTED NOT DETECTED Final   Candida parapsilosis NOT DETECTED NOT DETECTED Final   Candida tropicalis NOT DETECTED NOT DETECTED Final    Comment: Performed at [facility], [address] [place], [place] [address]      Scheduled Meds: . aspirin EC  81 mg Oral Daily  . baclofen  10 mg Oral TID  . budesonide (PULMICORT) nebulizer solution  0.5 mg Nebulization BID  . carvedilol  6.25 mg Oral BID WC  . cholecalciferol  1,000 Units Oral Daily  . docusate sodium  100 mg Oral BID  . escitalopram  10 mg Oral  Daily  . ferrous sulfate  325 mg Oral Q breakfast  . furosemide  20 mg Oral BID  . gabapentin  100 mg Oral QHS  . GLUCERNA  237 mL Oral BID BM  . insulin aspart  0-15 Units Subcutaneous TID WC  . insulin aspart  0-5 Units Subcutaneous QHS  . lactobacillus  1 g Oral TID WC  . magnesium oxide  400 mg Oral Daily  . Melatonin  2.5 mg Oral QHS  . multivitamin with minerals  1 tablet Oral Daily  . pantoprazole  40 mg Oral Daily  . potassium chloride  20 mEq Oral BID  . Rivaroxaban  15 mg Oral Daily  . rOPINIRole  0.5 mg Oral BID  . senna  1 tablet Oral BID  . sodium chloride flush  3 mL Intravenous Q12H  . spironolactone  25 mg Oral Daily  . vitamin B-12  1,000 mcg Oral Daily  . vitamin C  500 mg Oral Daily   Continuous Infusions: . sodium chloride    . ampicillin (OMNIPEN) IV    . piperacillin-tazobactam (ZOSYN)  IV      Assessment/Plan:  1. Clinical sepsis present on admission.  Strep agalactiae in the blood cultures and urine cultures.   Follow-up blood cultures with E. coli sensitive to Zosyn    Will follow up with infectious disease Echo -d one  Diarrhea-antibiotic induced, added probiotics and loperamide as needed.  Patient is concerned about continuing penicillin for a total of 14 days as she is getting diarrhea  2. Acute on chronic diastolic congestive heart failure.   continue Lasix but increase to twice a day dosing   3. Type 2 diabetes mellitus on sliding scale   4. COPD.  Continue inhalers   5. Peripheral vascular disease on Xarelto and aspirin   6. Weakness.  Physical therapy recommended rehab but the patient would like to go home.  Patient reports her husband and 2 other full-time workers help her out at home  Code Status:     Code Status Orders  (From admission,  onward)        Start     Ordered   07/15/17 1830  Do not attempt resuscitation (DNR)  Continuous    Question Answer Comment  In the event of cardiac or respiratory ARREST Do not call a  "code blue"   In the event of cardiac or respiratory ARREST Do not perform Intubation, CPR, defibrillation or ACLS   In the event of cardiac or respiratory ARREST Use medication by any route, position, wound care, and other measures to relive pain and suffering. May use oxygen, suction and manual treatment of airway obstruction as needed for comfort.      07/15/17 1830    Code Status History    Date Active Date Inactive Code Status Order ID Comments User Context   04/14/2017 03:44 04/18/2017 21:38 Full Code [id number]  Lance Coon, MD Inpatient   12/08/2016 01:07 12/09/2016 20:31 Full Code [id number]  Lance Coon, MD ED   09/07/2015 04:41 09/13/2015 19:55 Full Code [id number]  Saundra Shelling, MD Inpatient   09/01/2015 15:33 09/03/2015 19:07 Full Code [id number]  Epifanio Lesches, MD ED   03/21/2015 06:26 03/22/2015 19:39 Full Code [id number]  Harrie Foreman, MD Inpatient    Advance Directive Documentation     Most Recent Value  Type of Advance Directive  Healthcare Power of Attorney, Living will  Pre-existing out of facility DNR order (yellow form or pink MOST form)  No data  "MOST" Form in Place?  No data     Family Communication: Spoke with husband at the bedside Disposition Plan: To be determined  Antibiotics:  Zosyn  Time spent: 25 minutes  Illene Silver Katelee Schupp  [employer]

## 2017-07-18 NOTE — Progress Notes (Signed)
[facility] INFECTIOUS DISEASE PROGRESS NOTE Date of Admission:  07/15/2017     ID: [name] is a 84 y.o. female with GB strep bacteremia and UTI   Active Problems:   UTI (urinary tract infection)   Subjective: Doing well but had diarrhea this morning. No fevers   ROS  Eleven systems are reviewed and negative except per hpi  Medications:  Antibiotics Given (last 72 hours)    Date/Time Action Medication Dose Rate   07/16/17 0709 New Bag/Given   vancomycin (VANCOCIN) 1,250 mg in sodium chloride 0.9 % 250 mL IVPB 1,250 mg 166.7 mL/hr   07/16/17 1416 New Bag/Given   piperacillin-tazobactam (ZOSYN) IVPB 3.375 g 3.375 g 12.5 mL/hr   07/16/17 2146 New Bag/Given   piperacillin-tazobactam (ZOSYN) IVPB 3.375 g 3.375 g 12.5 mL/hr   07/17/17 7858 New Bag/Given   piperacillin-tazobactam (ZOSYN) IVPB 3.375 g 3.375 g 12.5 mL/hr   07/17/17 1731 New Bag/Given   piperacillin-tazobactam (ZOSYN) IVPB 3.375 g 3.375 g 12.5 mL/hr   07/18/17 0135 New Bag/Given   piperacillin-tazobactam (ZOSYN) IVPB 3.375 g 3.375 g 12.5 mL/hr   07/18/17 0907 New Bag/Given   piperacillin-tazobactam (ZOSYN) IVPB 3.375 g 3.375 g 12.5 mL/hr     . aspirin EC  81 mg Oral Daily  . baclofen  10 mg Oral TID  . budesonide (PULMICORT) nebulizer solution  0.5 mg Nebulization BID  . carvedilol  6.25 mg Oral BID WC  . cholecalciferol  1,000 Units Oral Daily  . docusate sodium  100 mg Oral BID  . escitalopram  10 mg Oral Daily  . ferrous sulfate  325 mg Oral Q breakfast  . furosemide  20 mg Oral BID  . gabapentin  100 mg Oral QHS  . GLUCERNA  237 mL Oral BID BM  . insulin aspart  0-15 Units Subcutaneous TID WC  . insulin aspart  0-5 Units Subcutaneous QHS  . lactobacillus  1 g Oral TID WC  . magnesium oxide  400 mg Oral Daily  . Melatonin  2.5 mg Oral QHS  . multivitamin with minerals  1 tablet Oral Daily  . pantoprazole  40 mg Oral Daily  . potassium chloride  20 mEq Oral BID  . Rivaroxaban  15 mg Oral  Daily  . rOPINIRole  0.5 mg Oral BID  . senna  1 tablet Oral BID  . sodium chloride flush  3 mL Intravenous Q12H  . spironolactone  25 mg Oral Daily  . vitamin B-12  1,000 mcg Oral Daily  . vitamin C  500 mg Oral Daily    Objective: Vital signs in last 24 hours: Temp:  [97.5 F (36.4 C)-98.5 F (36.9 C)] 98.5 F (36.9 C) (01/25 0852) Pulse Rate:  [70-74] 74 (01/25 0852) Resp:  [16-18] 16 (01/25 0852) BP: (111-132)/(44-59) 132/57 (01/25 0852) SpO2:  [92 %-100 %] 92 % (01/25 0852) Weight:  [84 kg (185 lb 3.2 oz)] 84 kg (185 lb 3.2 oz) (01/25 0434) Constitutional:  oriented to person, place, and time. Frail, NAD HENT: [place]/AT, PERRLA, no scleral icterus Mouth/Throat: Oropharynx is clear and moist. No oropharyngeal exudate.  Cardiovascular: Normal rate, regular rhythm2/6 sm Pulmonary/Chest:mild bibasilar crackles  Neck = supple, no nuchal rigidity Abdominal: Soft. Bowel sounds are normal.  exhibits no distension. There is no tenderness.  Lymphadenopathy: no cervical adenopathy. No axillary adenopathy Neurological: alert and oriented to person, place, and time.  Ext 1+ edema bil  Skin: Skin is warm and dry. L plantar surface foot with dried callus  Psychiatric: a normal mood and affect.  behavior is normal.    Lab Results Recent Labs    07/16/17 0146 07/17/17 0647 07/18/17 0523  WBC 15.2*  --  6.9  HGB 9.9*  --  10.1*  HCT 30.7*  --  31.2*  NA 135  --  138  K 3.4* 3.8 4.2  CL 97*  --  100*  CO2 30  --  30  BUN 35*  --  34*  CREATININE 1.28*  --  1.21*    Microbiology: Results for orders placed or performed during the hospital encounter of 07/15/17  Blood Culture (routine x 2)     Status: Abnormal   Collection Time: 07/15/17  1:57 PM  Result Value Ref Range Status   Specimen Description   Final    BLOOD LEFT ANTECUBITAL Performed at [facility], [address], [place], [place] [address]    Special Requests   Final    BOTTLES DRAWN AEROBIC AND ANAEROBIC  Blood Culture results may not be optimal due to an excessive volume of blood received in culture bottles Performed at [facility], [address] [place], [place] [address]    Culture  Setup Time   Final    GRAM POSITIVE COCCI ANAEROBIC BOTTLE ONLY CRITICAL RESULT CALLED TO, READ BACK BY AND VERIFIED WITH: MATT MCBANE AT 9528 ON 07/16/17 [place].    Culture GROUP B STREP(S.AGALACTIAE)ISOLATED (A)  Final   Report Status 07/18/2017 FINAL  Final   Organism ID, Bacteria GROUP B STREP(S.AGALACTIAE)ISOLATED  Final      Susceptibility   Group b strep(s.agalactiae)isolated - MIC*    CLINDAMYCIN <=0.25 SENSITIVE Sensitive     AMPICILLIN <=0.25 SENSITIVE Sensitive     ERYTHROMYCIN <=0.12 SENSITIVE Sensitive     VANCOMYCIN 0.5 SENSITIVE Sensitive     CEFTRIAXONE <=0.12 SENSITIVE Sensitive     LEVOFLOXACIN 1 SENSITIVE Sensitive     PENICILLIN Value in next row Sensitive      SENSITIVE0.12    * GROUP B STREP(S.AGALACTIAE)ISOLATED  Blood Culture (routine x 2)     Status: None (Preliminary result)   Collection Time: 07/15/17  1:57 PM  Result Value Ref Range Status   Specimen Description BLOOD BLOOD LEFT HAND  Final   Special Requests   Final    BOTTLES DRAWN AEROBIC AND ANAEROBIC Blood Culture results may not be optimal due to an excessive volume of blood received in culture bottles   Culture   Final    NO GROWTH 3 DAYS Performed at [facility], [address] [place], [place] [address]    Report Status PENDING  Incomplete  Urine culture     Status: Abnormal   Collection Time: 07/15/17  1:57 PM  Result Value Ref Range Status   Specimen Description   Final    URINE, RANDOM Performed at [facility], [address] [place], [place] [address]    Special Requests   Final    NONE Performed at [facility], [address], [place], [place] [address]    Culture (A)  Final    >=100,000 COLONIES/mL GROUP B STREP(S.AGALACTIAE)ISOLATED TESTING AGAINST S.  AGALACTIAE NOT ROUTINELY PERFORMED DUE TO PREDICTABILITY OF AMP/PEN/VAN SUSCEPTIBILITY. Performed at [facility], [place] [address], [place],  [address]    Report Status 07/17/2017 FINAL  Final  Blood Culture ID Panel (Reflexed)     Status: Abnormal   Collection Time: 07/15/17  1:57 PM  Result Value Ref Range Status  Enterococcus species NOT DETECTED NOT DETECTED Final   Listeria monocytogenes NOT DETECTED NOT DETECTED Final   Staphylococcus species NOT DETECTED NOT DETECTED Final   Staphylococcus aureus NOT DETECTED NOT DETECTED Final   Streptococcus species DETECTED (A) NOT DETECTED Final    Comment: CRITICAL RESULT CALLED TO, READ BACK BY AND VERIFIED WITH: MATT MCBANE AT 0552 ON 07/16/17 [place].    Streptococcus agalactiae DETECTED (A) NOT DETECTED Final    Comment: CRITICAL RESULT CALLED TO, READ BACK BY AND VERIFIED WITH: MATT MCBANE AT 0938 ON 07/16/17 [place].    Streptococcus pneumoniae NOT DETECTED NOT DETECTED Final   Streptococcus pyogenes NOT DETECTED NOT DETECTED Final   Acinetobacter baumannii NOT DETECTED NOT DETECTED Final   Enterobacteriaceae species NOT DETECTED NOT DETECTED Final   Enterobacter cloacae complex NOT DETECTED NOT DETECTED Final   Escherichia coli NOT DETECTED NOT DETECTED Final   Klebsiella oxytoca NOT DETECTED NOT DETECTED Final   Klebsiella pneumoniae NOT DETECTED NOT DETECTED Final   Proteus species NOT DETECTED NOT DETECTED Final   Serratia marcescens NOT DETECTED NOT DETECTED Final   Haemophilus influenzae NOT DETECTED NOT DETECTED Final   Neisseria meningitidis NOT DETECTED NOT DETECTED Final   Pseudomonas aeruginosa NOT DETECTED NOT DETECTED Final   Candida albicans NOT DETECTED NOT DETECTED Final   Candida glabrata NOT DETECTED NOT DETECTED Final   Candida krusei NOT DETECTED NOT DETECTED Final   Candida parapsilosis NOT DETECTED NOT DETECTED Final   Candida tropicalis NOT DETECTED NOT DETECTED Final    Comment: Performed at [facility], [address], [place], [place] [address]    Studies/Results: No results found.  Assessment/Plan: [name] is a 84 y.o. female admitted with CHF exacerbation likely precipitated by Group B strep bacteremia from urinary source. She is allergic to ceftriaxone and cipro but is tolerating zosyn. Clinically improving. Echo . She had admission in Oct with E coli bacteremia but no obvious source. UCX not done that admission and UA was negative 1/25- no fever, wbc down to 6. Having diarrhea but improved since this morning. Recommendations Change to IV PCN then she can be dced on oral PCN 500 QID for a total abx duration of 14 days.  As otpt may benefit from urology evaluation if UTIs recur.  Thank you very much for the consult. Will follow with you.  Leonel Ramsay   07/18/2017, 4:57 PM

## 2017-07-18 NOTE — Progress Notes (Signed)
Pharmacy Antibiotic Note  [name] is a 84 y.o. female admitted on 07/15/2017 with UTI and streptococcus agalactiae bacteremia.  Pharmacy has been consulted for piperacillin/tazobactam dosing.  Plan: Continue piperacillin/tazobactam 3.375 g IV q8h EI for now per discussion with ID who will assess sensitivities and ABX.  Height: 5\' 7"  (170.2 cm) Weight: 185 lb 3.2 oz (84 kg) IBW/kg (Calculated) : 61.6  Temp (24hrs), Avg:98 F (36.7 C), Min:97.5 F (36.4 C), Max:98.5 F (36.9 C)  Recent Labs  Lab 07/15/17 1357 07/15/17 1809 07/15/17 2327 07/16/17 0146 07/18/17 0523  WBC 15.1*  --   --  15.2* 6.9  CREATININE 1.19*  --   --  1.28* 1.21*  LATICACIDVEN 1.7 3.1* 1.1 1.1  --     Estimated Creatinine Clearance: 34.4 mL/min (A) (by C-G formula based on SCr of 1.21 mg/dL (H)).    Allergies  Allergen Reactions  . Ceftriaxone Swelling  . Cefuroxime Hives  . Cephalosporins Swelling and Other (See Comments)    Reaction:  Fainting/dry mouth   . Codeine Nausea And Vomiting  . Epinephrine Other (See Comments)    Reaction:  Fainting   . Morphine And Related Other (See Comments)    Reaction:  GI upset   . Buprenorphine Hcl Nausea And Vomiting  . Ciprofloxacin Itching, Swelling and Rash  . Latex Rash  . Lidocaine Rash  . Procaine Rash    Antimicrobials this admission: Piperacillin/tazobactam 1/23 >>  Vancomycin 1 dose on 1/23 Levofloxacin 1/22 >> 1/23  Dose adjustments this admission:  Microbiology results: 1/22 BCx: Streptococcus agalactiae 1/4 1/22 UCx: Strep  Thank you for allowing pharmacy to be a part of this patient's care.  Lenis Noon, PharmD, BCPS Clinical Pharmacist 07/18/2017 2:58 PM

## 2017-07-18 NOTE — Progress Notes (Signed)
*  PRELIMINARY RESULTS* Echocardiogram 2D Echocardiogram has been performed.  [name] 07/18/2017, 3:54 PM

## 2017-07-18 NOTE — Consult Note (Signed)
Pharmacy Antibiotic Note  [name] is a 84 y.o. female admitted on 07/15/2017 with bacteremia.  Pharmacy has been consulted for PCN dosing.  Plan: PCN 67million units q 4 hours  Height: 5\' 7"  (170.2 cm) Weight: 185 lb 3.2 oz (84 kg) IBW/kg (Calculated) : 61.6  Temp (24hrs), Avg:98 F (36.7 C), Min:97.5 F (36.4 C), Max:98.5 F (36.9 C)  Recent Labs  Lab 07/15/17 1357 07/15/17 1809 07/15/17 2327 07/16/17 0146 07/18/17 0523  WBC 15.1*  --   --  15.2* 6.9  CREATININE 1.19*  --   --  1.28* 1.21*  LATICACIDVEN 1.7 3.1* 1.1 1.1  --     Estimated Creatinine Clearance: 34.4 mL/min (A) (by C-G formula based on SCr of 1.21 mg/dL (H)).    Allergies  Allergen Reactions  . Ceftriaxone Swelling  . Cefuroxime Hives  . Cephalosporins Swelling and Other (See Comments)    Reaction:  Fainting/dry mouth   . Codeine Nausea And Vomiting  . Epinephrine Other (See Comments)    Reaction:  Fainting   . Morphine And Related Other (See Comments)    Reaction:  GI upset   . Buprenorphine Hcl Nausea And Vomiting  . Ciprofloxacin Itching, Swelling and Rash  . Latex Rash  . Lidocaine Rash  . Procaine Rash    Antimicrobials this admission: Piperacillin/tazobactam 1/23 >>  Vancomycin 1 dose on 1/23 Levofloxacin 1/22 >> 1/23 PCN 1/25>>  Dose adjustments this admission:   Microbiology results: 1/22 BCx: group B strep 1/2 1/22 UCx: group B strep    Thank you for allowing pharmacy to be a part of this patient's care.  Ramond Dial, Pharm.D, BCPS Clinical Pharmacist  07/18/2017 8:08 PM

## 2017-07-19 LAB — BASIC METABOLIC PANEL
ANION GAP: 9 (ref 5–15)
BUN: 31 mg/dL — ABNORMAL HIGH (ref 6–20)
CALCIUM: 8.8 mg/dL — AB (ref 8.9–10.3)
CHLORIDE: 100 mmol/L — AB (ref 101–111)
CO2: 30 mmol/L (ref 22–32)
Creatinine, Ser: 1.12 mg/dL — ABNORMAL HIGH (ref 0.44–1.00)
GFR calc Af Amer: 49 mL/min — ABNORMAL LOW (ref >60)
GFR calc non Af Amer: 42 mL/min — ABNORMAL LOW (ref >60)
GLUCOSE: 180 mg/dL — AB (ref 65–99)
Potassium: 4.2 mmol/L (ref 3.5–5.1)
Sodium: 139 mmol/L (ref 135–145)

## 2017-07-19 LAB — GLUCOSE, CAPILLARY
GLUCOSE-CAPILLARY: 169 mg/dL — AB (ref 65–99)
GLUCOSE-CAPILLARY: 271 mg/dL — AB (ref 65–99)

## 2017-07-19 LAB — ECHOCARDIOGRAM COMPLETE
HEIGHTINCHES: 67 in
Weight: 2963.2 oz

## 2017-07-19 LAB — MAGNESIUM: Magnesium: 2.3 mg/dL (ref 1.7–2.4)

## 2017-07-19 MED ORDER — LOPERAMIDE HCL 2 MG PO CAPS: 2.0000 mg | ORAL_CAPSULE | Freq: Three times a day (TID) | ORAL | 0 refills | Status: DC | PRN

## 2017-07-19 MED ORDER — PENICILLIN V POTASSIUM 500 MG PO TABS: 500.0000 mg | ORAL_TABLET | Freq: Four times a day (QID) | ORAL | 0 refills | Status: DC

## 2017-07-19 MED ORDER — FLORANEX PO PACK: 1.0000 g | PACK | Freq: Three times a day (TID) | ORAL | 0 refills | Status: DC

## 2017-07-19 MED ORDER — ONDANSETRON HCL 4 MG PO TABS: 4.0000 mg | ORAL_TABLET | Freq: Four times a day (QID) | ORAL | 0 refills | Status: DC | PRN

## 2017-07-19 MED ORDER — PENICILLIN V POTASSIUM 500 MG PO TABS
500.0000 mg | ORAL_TABLET | Freq: Four times a day (QID) | ORAL | Status: DC
  Filled 2017-07-19: qty 1

## 2017-07-19 NOTE — Discharge Instructions (Signed)
[facility] Failure Clinic appointment on July 24 2017 at 11:20am with Darylene Price, [place]. Please call [phone] to reschedule.  Follow-up with primary care physician in 1 week

## 2017-07-19 NOTE — Progress Notes (Signed)
Ms. Finan is discharging to home with [facility], patient agreeable, unwilling to go to rehab, will d/c on ampicillin for UTI, RX provided to patient and husband, shift uneventful, no further concerns or questions. Patient has private sitters at home that will assist with care. D/c instructions reviewed with patient and husband, questions answered.

## 2017-07-19 NOTE — Care Management Note (Signed)
Case Management Note  Patient Details  [name] MRN: [id number] Date of Birth: 10/13/1924  Subjective/Objective:     Call in to [facility] at [facility] to determine if [name] is currently open to [facility] and for what services. Awaiting a call-back from [facility].                Action/Plan:   Expected Discharge Date:  07/19/17               Expected Discharge Plan:     In-House Referral:     Discharge planning Services     Post Acute Care Choice:    Choice offered to:     DME Arranged:    DME Agency:     HH Arranged:    HH Agency:     Status of Service:     If discussed at [employer], dates discussed:    Additional Comments:  [name], RN 07/19/2017, 11:24 AM

## 2017-07-19 NOTE — Discharge Summary (Signed)
[facility] [name]    MR#:  [id number]  DATE OF BIRTH:  1924-12-01  DATE OF ADMISSION:  07/15/2017 ADMITTING PHYSICIAN: Gorden Harms, MD  DATE OF DISCHARGE: 07/19/17  PRIMARY CARE PHYSICIAN: Tracie Harrier, MD    ADMISSION DIAGNOSIS:  Shortness of breath [R06.02] Acute pulmonary edema (HCC) [J81.0] Weakness [R53.1] Lower urinary tract infection [N39.0]  DISCHARGE DIAGNOSIS:  Active Problems:   UTI (urinary tract infection) Group B streptococcal bacteremia  SECONDARY DIAGNOSIS:   Past Medical History:  Diagnosis Date  . Cancer ([place])    skin ca  . CHF (congestive heart failure) ([place])   . Chronic back pain   . COPD (chronic obstructive pulmonary disease) ([place])   . Diabetes mellitus without complication ([place])   . Hypertension   . Poor perfusion of leg     HOSPITAL COURSE:   HISTORY OF PRESENT ILLNESS: [name]  is a 84 y.o. female with a known history for CAD shortness of breath, and shortness of breath, had fallen yesterday-unable to get up, called EMS, patient transferred here for further evaluation/care, ER workup included for tachypnea, troponin 0.06,, troponin 0.06, white count 15,000, platelet count 106, hemoglobin 10.9-60, total bili 1.3, urinalysis suspicious suspicious for UTI, chest x-ray negative for coronary edema, right knee/lower extremity x-rays negative for fracture. Patient evaluated in emergency room, family at the bedside, patient complains of her chronic back pain only-no worse than previous, patient is now being admitted for acute urinary tract infection, acute on chronic systolic congestive heart failure exacerbation which is mild.    1. Clinical sepsis present on admission.   Strep agalactiae in the  urine cultures.   Follow-up blood cultures with group B streptococcus    Will follow up with infectious disease Echo -65-70% ejection fraction, normal wall motion.  No cardiac  source of emboli was noticed Diarrhea-antibiotic induced, added probiotics and loperamide as needed.  Patient's diarrhea significantly improved.   Discharge patient with [facility] K 4 times daily for a total of 14 days   2. Acute on chronic diastolic congestive heart failure.   continue Lasix but increase to twice a day dosing   3. Type 2 diabetes mellitus on sliding scale   4. COPD.  Continue inhalers   5. Peripheral vascular disease on Xarelto and aspirin   6. Weakness.  Physical therapy recommended rehab but the patient would like to go home.  Patient reports her husband and 2 other full-time workers help her out at home Discharge with home health PT    DISCHARGE CONDITIONS:   STABLE  CONSULTS OBTAINED:  Treatment Team:  Leonel Ramsay, MD   PROCEDURES  None   DRUG ALLERGIES:   Allergies  Allergen Reactions  . Ceftriaxone Swelling  . Cefuroxime Hives  . Cephalosporins Swelling and Other (See Comments)    Reaction:  Fainting/dry mouth   . Codeine Nausea And Vomiting  . Epinephrine Other (See Comments)    Reaction:  Fainting   . Morphine And Related Other (See Comments)    Reaction:  GI upset   . Buprenorphine Hcl Nausea And Vomiting  . Ciprofloxacin Itching, Swelling and Rash  . Latex Rash  . Lidocaine Rash  . Procaine Rash    DISCHARGE MEDICATIONS:   Allergies as of 07/19/2017      Reactions   Ceftriaxone Swelling   Cefuroxime Hives   Cephalosporins Swelling, Other (See Comments)   Reaction:  Fainting/dry mouth  Codeine Nausea And Vomiting   Epinephrine Other (See Comments)   Reaction:  Fainting    Morphine And Related Other (See Comments)   Reaction:  GI upset    Buprenorphine Hcl Nausea And Vomiting   Ciprofloxacin Itching, Swelling, Rash   Latex Rash   Lidocaine Rash   Procaine Rash      Medication List    STOP taking these medications   ALPRAZolam 0.5 MG tablet Commonly known as:  XANAX   insulin aspart 100 UNIT/ML  injection Commonly known as:  novoLOG     TAKE these medications   acetaminophen 325 MG tablet Commonly known as:  TYLENOL Take 2 tablets (650 mg total) by mouth every 6 (six) hours as needed for mild pain (or Fever >/= 101).   aspirin EC 81 MG tablet Take 81 mg by mouth daily.   baclofen 10 MG tablet Commonly known as:  LIORESAL Take 10 mg by mouth 3 (three) times daily.   carvedilol 6.25 MG tablet Commonly known as:  COREG Take 6.25 mg by mouth 2 (two) times daily with a meal.   cholecalciferol 1000 units tablet Commonly known as:  VITAMIN D Take 1,000 Units by mouth daily.   docusate sodium 100 MG capsule Commonly known as:  COLACE Take 100 mg by mouth 2 (two) times daily.   escitalopram 10 MG tablet Commonly known as:  LEXAPRO Take 10 mg by mouth daily.   ferrous sulfate 325 (65 FE) MG tablet Take 325 mg by mouth daily with breakfast.   furosemide 20 MG tablet Commonly known as:  LASIX Take 1 tablet (20 mg total) by mouth daily. Pt alternates with the 80mg  tablet. What changed:    how much to take  additional instructions   gabapentin 100 MG capsule Commonly known as:  NEURONTIN Take 100-200 mg by mouth at bedtime.   glimepiride 4 MG tablet Commonly known as:  AMARYL Take 4 mg by mouth 2 (two) times daily.   GLUCERNA Liqd Take 237 mLs by mouth 2 (two) times daily between meals.   JANUVIA 100 MG tablet Generic drug:  sitaGLIPtin Take 100 mg by mouth daily.   lactobacillus Pack Take 1 packet (1 g total) by mouth 3 (three) times daily with meals.   loperamide 2 MG capsule Commonly known as:  IMODIUM Take 1 capsule (2 mg total) by mouth every 8 (eight) hours as needed for diarrhea or loose stools.   magnesium oxide 400 MG tablet Commonly known as:  MAG-OX Take 400 mg by mouth daily.   Melatonin 1 MG Tabs Take 1 mg by mouth at bedtime.   multivitamin tablet Take 1 tablet by mouth daily.   ondansetron 4 MG tablet Commonly known as:   ZOFRAN Take 1 tablet (4 mg total) by mouth every 6 (six) hours as needed for nausea.   oxyCODONE 5 MG immediate release tablet Commonly known as:  Oxy IR/ROXICODONE Take 1 tablet (5 mg total) by mouth every 6 (six) hours as needed for moderate pain.   pantoprazole 40 MG tablet Commonly known as:  PROTONIX Take 40 mg by mouth daily.   penicillin v potassium 500 MG tablet Commonly known as:  VEETID Take 1 tablet (500 mg total) by mouth every 6 (six) hours.   potassium chloride SA 20 MEQ tablet Commonly known as:  K-DUR,KLOR-CON Take 20 mEq by mouth 2 (two) times daily.   Rivaroxaban 15 MG Tabs tablet Commonly known as:  XARELTO Take 15 mg by mouth daily.  rOPINIRole 0.5 MG tablet Commonly known as:  REQUIP Take 0.5 mg by mouth 2 (two) times daily.   spironolactone 25 MG tablet Commonly known as:  ALDACTONE Take 25 mg by mouth daily.   vitamin B-12 1000 MCG tablet Commonly known as:  CYANOCOBALAMIN Take 1,000 mcg by mouth daily.   vitamin C 500 MG tablet Commonly known as:  ASCORBIC ACID Take 500 mg by mouth daily.   Vitamin D (Ergocalciferol) 50000 units Caps capsule Commonly known as:  DRISDOL Take 50,000 Units by mouth every 7 (seven) days. Pt takes on Thursday.        DISCHARGE INSTRUCTIONS:     DIET:  Cardiac diet  DISCHARGE CONDITION:  Stable  ACTIVITY:  Activity as tolerated  OXYGEN:  Home Oxygen: No.   Oxygen Delivery: room air  DISCHARGE LOCATION:  home   If you experience worsening of your admission symptoms, develop shortness of breath, life threatening emergency, suicidal or homicidal thoughts you must seek medical attention immediately by calling 911 or calling your MD immediately  if symptoms less severe.  You Must read complete instructions/literature along with all the possible adverse reactions/side effects for all the Medicines you take and that have been prescribed to you. Take any new Medicines after you have completely  understood and accpet all the possible adverse reactions/side effects.   Please note  You were cared for by a hospitalist during your hospital stay. If you have any questions about your discharge medications or the care you received while you were in the hospital after you are discharged, you can call the unit and asked to speak with the hospitalist on call if the hospitalist that took care of you is not available. Once you are discharged, your primary care physician will handle any further medical issues. Please note that NO REFILLS for any discharge medications will be authorized once you are discharged, as it is imperative that you return to your primary care physician (or establish a relationship with a primary care physician if you do not have one) for your aftercare needs so that they can reassess your need for medications and monitor your lab values.     Today  Chief Complaint  Patient presents with  . Shortness of Breath   Is feeling fine.  Diarrhea significantly improved.  Wants to go home.  ROS:  CONSTITUTIONAL: Denies fevers, chills. Denies any fatigue, weakness.  EYES: Denies blurry vision, double vision, eye pain. EARS, NOSE, THROAT: Denies tinnitus, ear pain, hearing loss. RESPIRATORY: Denies cough, wheeze, shortness of breath.  CARDIOVASCULAR: Denies chest pain, palpitations, edema.  GASTROINTESTINAL: Denies nausea, vomiting, diarrhea, abdominal pain. Denies bright red blood per rectum. GENITOURINARY: Denies dysuria, hematuria. ENDOCRINE: Denies nocturia or thyroid problems. HEMATOLOGIC AND LYMPHATIC: Denies easy bruising or bleeding. SKIN: Denies rash or lesion. MUSCULOSKELETAL: Denies pain in neck, back, shoulder, knees, hips or arthritic symptoms.  NEUROLOGIC: Denies paralysis, paresthesias.  PSYCHIATRIC: Denies anxiety or depressive symptoms.   VITAL SIGNS:  Blood pressure (!) 144/66, pulse 74, temperature 97.8 F (36.6 C), temperature source Oral, resp. rate 18,  height 5\' 7"  (1.702 m), weight 81.4 kg (179 lb 6.4 oz), SpO2 91 %.  I/O:    Intake/Output Summary (Last 24 hours) at 07/19/2017 1432 Last data filed at 07/19/2017 1209 Gross per 24 hour  Intake 850 ml  Output 900 ml  Net -50 ml    PHYSICAL EXAMINATION:  GENERAL:  84 y.o.-year-old patient lying in the bed with no acute distress.  EYES: Pupils equal,  round, reactive to light and accommodation. No scleral icterus. Extraocular muscles intact.  HEENT: Head atraumatic, normocephalic. Oropharynx and nasopharynx clear.  NECK:  Supple, no jugular venous distention. No thyroid enlargement, no tenderness.  LUNGS: Normal breath sounds bilaterally, no wheezing, rales,rhonchi or crepitation. No use of accessory muscles of respiration.  CARDIOVASCULAR: S1, S2 normal. No murmurs, rubs, or gallops.  ABDOMEN: Soft, non-tender, non-distended. Bowel sounds present. No organomegaly or mass.  EXTREMITIES: No pedal edema, cyanosis, or clubbing.  NEUROLOGIC: Cranial nerves II through XII are intact. Muscle strength 5/5 in all extremities. Sensation intact. Gait not checked.  PSYCHIATRIC: The patient is alert and oriented x 3.  SKIN: No obvious rash, lesion, or ulcer.   DATA REVIEW:   CBC Recent Labs  Lab 07/18/17 0523  WBC 6.9  HGB 10.1*  HCT 31.2*  PLT 110*    Chemistries  Recent Labs  Lab 07/15/17 1357  07/19/17 0352  NA 135   < > 139  K 4.3   < > 4.2  CL 100*   < > 100*  CO2 28   < > 30  GLUCOSE 265*   < > 180*  BUN 32*   < > 31*  CREATININE 1.19*   < > 1.12*  CALCIUM 8.7*   < > 8.8*  MG  --   --  2.3  AST 30  --   --   ALT 16  --   --   ALKPHOS 63  --   --   BILITOT 1.3*  --   --    < > = values in this interval not displayed.    Cardiac Enzymes Recent Labs  Lab 07/15/17 1357  TROPONINI 0.06*    Microbiology Results  Results for orders placed or performed during the hospital encounter of 07/15/17  Blood Culture (routine x 2)     Status: Abnormal   Collection Time:  07/15/17  1:57 PM  Result Value Ref Range Status   Specimen Description   Final    BLOOD LEFT ANTECUBITAL Performed at [facility], [address] [place], [place] [address]    Special Requests   Final    BOTTLES DRAWN AEROBIC AND ANAEROBIC Blood Culture results may not be optimal due to an excessive volume of blood received in culture bottles Performed at [facility], [address] [place], [place] [address]    Culture  Setup Time   Final    GRAM POSITIVE COCCI ANAEROBIC BOTTLE ONLY CRITICAL RESULT CALLED TO, READ BACK BY AND VERIFIED WITH: MATT MCBANE AT [facility] ON 07/16/17 [place].    Culture GROUP B STREP(S.AGALACTIAE)ISOLATED (A)  Final   Report Status 07/18/2017 FINAL  Final   Organism ID, Bacteria GROUP B STREP(S.AGALACTIAE)ISOLATED  Final      Susceptibility   Group b strep(s.agalactiae)isolated - MIC*    CLINDAMYCIN <=0.25 SENSITIVE Sensitive     AMPICILLIN <=0.25 SENSITIVE Sensitive     ERYTHROMYCIN <=0.12 SENSITIVE Sensitive     VANCOMYCIN 0.5 SENSITIVE Sensitive     CEFTRIAXONE <=0.12 SENSITIVE Sensitive     LEVOFLOXACIN 1 SENSITIVE Sensitive     PENICILLIN Value in next row Sensitive      SENSITIVE0.12    * GROUP B STREP(S.AGALACTIAE)ISOLATED  Blood Culture (routine x 2)     Status: None (Preliminary result)   Collection Time: 07/15/17  1:57 PM  Result Value Ref Range Status   Specimen Description BLOOD BLOOD LEFT HAND  Final   Special Requests  Final    BOTTLES DRAWN AEROBIC AND ANAEROBIC Blood Culture results may not be optimal due to an excessive volume of blood received in culture bottles   Culture   Final    NO GROWTH 4 DAYS Performed at [facility], [address], [place], [place] [address]    Report Status PENDING  Incomplete  Urine culture     Status: Abnormal   Collection Time: 07/15/17  1:57 PM  Result Value Ref Range Status   Specimen Description   Final    URINE, RANDOM Performed at [facility], [address] [place], [place] [address]    Special Requests   Final    NONE Performed at [facility], [address], [place], [place] [address]    Culture (A)  Final    >=100,000 COLONIES/mL GROUP B STREP(S.AGALACTIAE)ISOLATED TESTING AGAINST S. AGALACTIAE NOT ROUTINELY PERFORMED DUE TO PREDICTABILITY OF AMP/PEN/VAN SUSCEPTIBILITY. Performed at [facility], [place] [address], [place], [place] [address]    Report Status 07/17/2017 FINAL  Final  Blood Culture ID Panel (Reflexed)     Status: Abnormal   Collection Time: 07/15/17  1:57 PM  Result Value Ref Range Status   Enterococcus species NOT DETECTED NOT DETECTED Final   Listeria monocytogenes NOT DETECTED NOT DETECTED Final   Staphylococcus species NOT DETECTED NOT DETECTED Final   Staphylococcus aureus NOT DETECTED NOT DETECTED Final   Streptococcus species DETECTED (A) NOT DETECTED Final    Comment: CRITICAL RESULT CALLED TO, READ BACK BY AND VERIFIED WITH: MATT MCBANE AT 0552 ON 07/16/17 [place].    Streptococcus agalactiae DETECTED (A) NOT DETECTED Final    Comment: CRITICAL RESULT CALLED TO, READ BACK BY AND VERIFIED WITH: MATT MCBANE AT 2585 ON 07/16/17 [place].    Streptococcus pneumoniae NOT DETECTED NOT DETECTED Final   Streptococcus pyogenes NOT DETECTED NOT DETECTED Final   Acinetobacter baumannii NOT DETECTED NOT DETECTED Final   Enterobacteriaceae species NOT DETECTED NOT DETECTED Final   Enterobacter cloacae complex NOT DETECTED NOT DETECTED Final   Escherichia coli NOT DETECTED NOT DETECTED Final   Klebsiella oxytoca NOT DETECTED NOT DETECTED Final   Klebsiella pneumoniae NOT DETECTED NOT DETECTED Final   Proteus species NOT DETECTED NOT DETECTED Final   Serratia marcescens NOT DETECTED NOT DETECTED Final   Haemophilus influenzae NOT DETECTED NOT DETECTED Final   Neisseria meningitidis NOT DETECTED NOT DETECTED Final   Pseudomonas aeruginosa NOT DETECTED NOT DETECTED Final   Candida albicans NOT DETECTED  NOT DETECTED Final   Candida glabrata NOT DETECTED NOT DETECTED Final   Candida krusei NOT DETECTED NOT DETECTED Final   Candida parapsilosis NOT DETECTED NOT DETECTED Final   Candida tropicalis NOT DETECTED NOT DETECTED Final    Comment: Performed at [facility], [address], [place], [place] [address]    RADIOLOGY:  No results found.  EKG:   Orders placed or performed during the hospital encounter of 07/15/17  . EKG 12-Lead  . EKG 12-Lead      Management plans discussed with the patient, family and they are in agreement.  CODE STATUS:     Code Status Orders  (From admission, onward)        Start     Ordered   07/15/17 1830  Do not attempt resuscitation (DNR)  Continuous    Question Answer Comment  In the event of cardiac or respiratory ARREST Do not call a "code blue"   In the event of cardiac  or respiratory ARREST Do not perform Intubation, CPR, defibrillation or ACLS   In the event of cardiac or respiratory ARREST Use medication by any route, position, wound care, and other measures to relive pain and suffering. May use oxygen, suction and manual treatment of airway obstruction as needed for comfort.      07/15/17 1830    Code Status History    Date Active Date Inactive Code Status Order ID Comments User Context   04/14/2017 03:44 04/18/2017 21:38 Full Code [id number]  Lance Coon, MD Inpatient   12/08/2016 01:07 12/09/2016 20:31 Full Code [id number]  Lance Coon, MD ED   09/07/2015 04:41 09/13/2015 19:55 Full Code [id number]  Saundra Shelling, MD Inpatient   09/01/2015 15:33 09/03/2015 19:07 Full Code [id number]  Epifanio Lesches, MD ED   03/21/2015 06:26 03/22/2015 19:39 Full Code [id number]  Harrie Foreman, MD Inpatient    Advance Directive Documentation     Most Recent Value  Type of Advance Directive  Healthcare Power of Attorney, Living will  Pre-existing out of facility DNR order (yellow form or pink MOST form)  No data  "MOST" Form in Place?   No data      TOTAL TIME TAKING CARE OF THIS PATIENT: 74minutes.   Note: This dictation was prepared with Dragon dictation along with smaller phrase technology. Any transcriptional errors that result from this process are unintentional.   @[facility] @  on 07/19/2017 at 2:32 PM  Between 7am to 6pm - Pager - [phone]  After 6pm go to [email or web address] - password [email or web address] [facility]  Office  [phone]  CC: Primary care physician; Tracie Harrier, MD

## 2017-07-19 NOTE — Care Management Note (Signed)
Case Management Note  Patient Details  Name: Alysson Geist MRN: [id number] Date of Birth: 1924-12-20  Subjective/Objective:     Referral for resumption of HH=PT and RN called to [facility] at [facility].                Action/Plan:   Expected Discharge Date:  07/19/17               Expected Discharge Plan:     In-House Referral:     Discharge planning Services     Post Acute Care Choice:    Choice offered to:     DME Arranged:    DME Agency:     HH Arranged:    HH Agency:     Status of Service:     If discussed at [employer], dates discussed:    Additional Comments:  Silvio Sausedo A, RN 07/19/2017, 11:50 AM

## 2017-07-20 LAB — CULTURE, BLOOD (ROUTINE X 2): Culture: NO GROWTH

## 2017-07-24 ENCOUNTER — Ambulatory Visit: Payer: Medicare Other | Admitting: Family

## 2017-07-28 ENCOUNTER — Encounter: Payer: Self-pay | Admitting: Intensive Care

## 2017-07-28 ENCOUNTER — Emergency Department
Admission: EM | Admit: 2017-07-28 | Discharge: 2017-07-28 | Disposition: A | Discharge status: Home / Self Care | Payer: Medicare Other | Attending: Emergency Medicine | Admitting: Emergency Medicine

## 2017-07-28 ENCOUNTER — Emergency Department: Payer: Medicare Other

## 2017-07-28 DIAGNOSIS — Z91040 Latex allergy status: Secondary | ICD-10-CM | POA: Insufficient documentation

## 2017-07-28 DIAGNOSIS — I5023 Acute on chronic systolic (congestive) heart failure: Secondary | ICD-10-CM | POA: Diagnosis not present

## 2017-07-28 DIAGNOSIS — Z7984 Long term (current) use of oral hypoglycemic drugs: Secondary | ICD-10-CM | POA: Diagnosis not present

## 2017-07-28 DIAGNOSIS — Z7901 Long term (current) use of anticoagulants: Secondary | ICD-10-CM | POA: Insufficient documentation

## 2017-07-28 DIAGNOSIS — Z85828 Personal history of other malignant neoplasm of skin: Secondary | ICD-10-CM | POA: Diagnosis not present

## 2017-07-28 DIAGNOSIS — I110 Hypertensive heart disease with heart failure: Secondary | ICD-10-CM | POA: Diagnosis not present

## 2017-07-28 DIAGNOSIS — Z950 Presence of cardiac pacemaker: Secondary | ICD-10-CM | POA: Insufficient documentation

## 2017-07-28 DIAGNOSIS — R531 Weakness: Secondary | ICD-10-CM | POA: Diagnosis not present

## 2017-07-28 DIAGNOSIS — E119 Type 2 diabetes mellitus without complications: Secondary | ICD-10-CM | POA: Insufficient documentation

## 2017-07-28 DIAGNOSIS — Z7982 Long term (current) use of aspirin: Secondary | ICD-10-CM | POA: Insufficient documentation

## 2017-07-28 DIAGNOSIS — Z79899 Other long term (current) drug therapy: Secondary | ICD-10-CM | POA: Diagnosis not present

## 2017-07-28 LAB — BASIC METABOLIC PANEL
Anion gap: 7 (ref 5–15)
BUN: 35 mg/dL — AB (ref 6–20)
CO2: 26 mmol/L (ref 22–32)
CREATININE: 1.41 mg/dL — AB (ref 0.44–1.00)
Calcium: 9.2 mg/dL (ref 8.9–10.3)
Chloride: 102 mmol/L (ref 101–111)
GFR calc Af Amer: 37 mL/min — ABNORMAL LOW (ref >60)
GFR, EST NON AFRICAN AMERICAN: 32 mL/min — AB (ref >60)
Glucose, Bld: 255 mg/dL — ABNORMAL HIGH (ref 65–99)
POTASSIUM: 5.3 mmol/L — AB (ref 3.5–5.1)
Sodium: 135 mmol/L (ref 135–145)

## 2017-07-28 LAB — URINALYSIS, COMPLETE (UACMP) WITH MICROSCOPIC
Bacteria, UA: NONE SEEN
Bilirubin Urine: NEGATIVE
GLUCOSE, UA: NEGATIVE mg/dL
Hgb urine dipstick: NEGATIVE
KETONES UR: NEGATIVE mg/dL
Nitrite: NEGATIVE
PH: 5 (ref 5.0–8.0)
Protein, ur: NEGATIVE mg/dL
SPECIFIC GRAVITY, URINE: 1.016 (ref 1.005–1.030)

## 2017-07-28 LAB — TROPONIN I

## 2017-07-28 LAB — CBC
HEMATOCRIT: 32.5 % — AB (ref 35.0–47.0)
Hemoglobin: 10.8 g/dL — ABNORMAL LOW (ref 12.0–16.0)
MCH: 30.8 pg (ref 26.0–34.0)
MCHC: 33.2 g/dL (ref 32.0–36.0)
MCV: 93 fL (ref 80.0–100.0)
PLATELETS: 191 10*3/uL (ref 150–440)
RBC: 3.49 MIL/uL — ABNORMAL LOW (ref 3.80–5.20)
RDW: 15 % — AB (ref 11.5–14.5)
WBC: 8.6 10*3/uL (ref 3.6–11.0)

## 2017-07-28 LAB — LACTIC ACID, PLASMA: LACTIC ACID, VENOUS: 1.3 mmol/L (ref 0.5–1.9)

## 2017-07-28 MED ORDER — SODIUM CHLORIDE 0.9 % IV BOLUS (SEPSIS): 500.0000 mL | Freq: Once | INTRAVENOUS | Status: DC

## 2017-07-28 NOTE — ED Provider Notes (Signed)
[facility] Emergency Department Provider Note   ____________________________________________   I have reviewed the triage vital signs and the nursing notes.   HISTORY  Chief Complaint "Feel bad"  History limited by: Not Limited   HPI [name] is a 84 y.o. female who presents to the emergency department today because of concern for feeling bad. The patient had a recent hospitalization with discharge 9 days secondary to UTI and sepsis. She felt a little bit better initially however starting 6 days ago started to feel worse. She describes it primarily as weakness. Has had some shortness of breath. Saw her primary care physician who put her on bactrim. Family is not sure what was being treated. Since starting the bactrim the patient has felt worse. When they called PCP office today the patient was advised to come to the ER for evaluation. Patient has not had any associated fevers.  She denies any dysuria or change in odor to her urine.   Per medical record review patient has a history of recent admission, UTI.  Past Medical History:  Diagnosis Date  . Cancer ([place])    skin ca  . CHF (congestive heart failure) ([place])   . Chronic back pain   . COPD (chronic obstructive pulmonary disease) ([place])   . Diabetes mellitus without complication ([place])   . Hypertension   . Poor perfusion of leg     Patient Active Problem List   Diagnosis Date Noted  . Sepsis ([place]) 04/14/2017  . CAP (community acquired pneumonia) 04/14/2017  . COPD (chronic obstructive pulmonary disease) ([place]) 12/07/2016  . UTI (urinary tract infection) 12/07/2016  . Ventral hernia without obstruction or gangrene   . Pneumonia 09/07/2015  . Hyponatremia 09/07/2015  . Acute respiratory failure ([place]) 09/01/2015  . Atrial fibrillation ([place]) 04/10/2015  . Essential hypertension 04/10/2015  . Diabetes ([place]) 04/10/2015  . Back pain 04/10/2015  . Acute on chronic combined systolic and diastolic  CHF (congestive heart failure) ([place]) 03/21/2015  . Pulmonary nodule 11/10/2014    Past Surgical History:  Procedure Laterality Date  . ABDOMINAL HYSTERECTOMY    . BACK SURGERY     x3  . CORONARY STENT PLACEMENT     unknown location per pt  . FOOT SURGERY    . LEG SURGERY    . PACEMAKER PLACEMENT    . TONSILLECTOMY      Prior to Admission medications   Medication Sig Start Date End Date Taking? Authorizing Provider  acetaminophen (TYLENOL) 325 MG tablet Take 2 tablets (650 mg total) by mouth every 6 (six) hours as needed for mild pain (or Fever >/= 101). 04/18/17  Yes Gouru, Illene Silver, MD  loperamide (IMODIUM) 2 MG capsule Take 1 capsule (2 mg total) by mouth every 8 (eight) hours as needed for diarrhea or loose stools. 07/19/17  Yes Gouru, Aruna, MD  ondansetron (ZOFRAN) 4 MG tablet Take 1 tablet (4 mg total) by mouth every 6 (six) hours as needed for nausea. 07/19/17  Yes Gouru, Illene Silver, MD  aspirin EC 81 MG tablet Take 81 mg by mouth daily.    [provider]  baclofen (LIORESAL) 10 MG tablet Take 10 mg by mouth 3 (three) times daily.    [provider]  carvedilol (COREG) 6.25 MG tablet Take 6.25 mg by mouth 2 (two) times daily with a meal.     [provider]  cholecalciferol (VITAMIN D) 1000 units tablet Take 1,000 Units by mouth daily.    [provider]  docusate sodium (COLACE) 100 MG capsule Take 100 mg by mouth 2 (two) times daily.     [provider]  escitalopram (LEXAPRO) 10 MG tablet Take 10 mg by mouth daily.    [provider]  ferrous sulfate 325 (65 FE) MG tablet Take 325 mg by mouth daily with breakfast.    [provider]  furosemide (LASIX) 20 MG tablet Take 1 tablet (20 mg total) by mouth daily. Pt alternates with the 80mg  tablet. Patient taking differently: Take 40 mg by mouth daily. Pt alternates with the 80mg  tablet.  04/18/17   Nicholes Mango, MD  gabapentin (NEURONTIN) 100 MG capsule Take 100-200 mg by  mouth at bedtime.    [provider]  glimepiride (AMARYL) 4 MG tablet Take 4 mg by mouth 2 (two) times daily. 07/01/17   [provider]  GLUCERNA (GLUCERNA) LIQD Take 237 mLs by mouth 2 (two) times daily between meals. Patient not taking: Reported on 07/15/2017 04/18/17   Nicholes Mango, MD  lactobacillus (FLORANEX/LACTINEX) PACK Take 1 packet (1 g total) by mouth 3 (three) times daily with meals. 07/19/17   Nicholes Mango, MD  magnesium oxide (MAG-OX) 400 MG tablet Take 400 mg by mouth daily.    [provider]  Melatonin 1 MG TABS Take 1 mg by mouth at bedtime.    [provider]  Multiple Vitamin (MULTIVITAMIN) tablet Take 1 tablet by mouth daily.    [provider]  oxyCODONE (OXY IR/ROXICODONE) 5 MG immediate release tablet Take 1 tablet (5 mg total) by mouth every 6 (six) hours as needed for moderate pain. 04/18/17   Gouru, Illene Silver, MD  pantoprazole (PROTONIX) 40 MG tablet Take 40 mg by mouth daily.    [provider]  penicillin v potassium (VEETID) 500 MG tablet Take 1 tablet (500 mg total) by mouth every 6 (six) hours. 07/19/17   Gouru, Illene Silver, MD  potassium chloride SA (K-DUR,KLOR-CON) 20 MEQ tablet Take 20 mEq by mouth 2 (two) times daily. 06/25/17   [provider]  Rivaroxaban (XARELTO) 15 MG TABS tablet Take 15 mg by mouth daily.    [provider]  rOPINIRole (REQUIP) 0.5 MG tablet Take 0.5 mg by mouth 2 (two) times daily.    [provider]  sitaGLIPtin (JANUVIA) 100 MG tablet Take 100 mg by mouth daily. 12/06/16   [provider]  spironolactone (ALDACTONE) 25 MG tablet Take 25 mg by mouth daily.     [provider]  vitamin B-12 (CYANOCOBALAMIN) 1000 MCG tablet Take 1,000 mcg by mouth daily.    [provider]  vitamin C (ASCORBIC ACID) 500 MG tablet Take 500 mg by mouth daily.    [provider]  Vitamin D, Ergocalciferol, (DRISDOL) 50000 UNITS CAPS capsule Take 50,000 Units by  mouth every 7 (seven) days. Pt takes on Thursday.    [provider]    Allergies Ceftriaxone; Cefuroxime; Cephalosporins; Codeine; Epinephrine; Morphine and related; Buprenorphine hcl; Ciprofloxacin; Latex; Lidocaine; and Procaine  Family History  Problem Relation Age of Onset  . CVA Mother        Congestive Heart Failure, heart attack, hypertension, stroke  . Diabetes Mellitus II Sister   . Heart disease Father        Congestive Heart Failure, heart attack, hypertension    Social History Social History   Tobacco Use  . Smoking status: Never Smoker  . Smokeless tobacco: Never Used  Substance Use Topics  . Alcohol use: No  Alcohol/week: 0.0 oz  . Drug use: No    Review of Systems Constitutional: No fever/chills.  Positive for generalized weakness Eyes: No visual changes. ENT: No sore throat. Cardiovascular: Denies chest pain. Respiratory: Positive for shortness of breath. Gastrointestinal: No abdominal pain.  No nausea, no vomiting.  No diarrhea.   Genitourinary: Negative for dysuria. Musculoskeletal: Negative for back pain. Skin: Negative for rash. Neurological: Negative for headaches, focal weakness or numbness.  ____________________________________________   PHYSICAL EXAM:  VITAL SIGNS: ED Triage Vitals  Enc Vitals Group     BP 07/28/17 1135 131/64     Pulse Rate 07/28/17 1135 70     Resp 07/28/17 1135 20     Temp 07/28/17 1135 98.2 F (36.8 C)     Temp Source 07/28/17 1135 Oral     SpO2 07/28/17 1133 92 %     Weight 07/28/17 1136 179 lb (81.2 kg)     Height 07/28/17 1136 5\' 7"  (1.702 m)     Head Circumference --      Peak Flow --      Pain Score 07/28/17 1136 5   Constitutional: Alert and oriented. Well appearing and in no distress. Eyes: Conjunctivae are normal.  ENT   Head: Normocephalic and atraumatic.   Nose: No congestion/rhinnorhea.   Mouth/Throat: Mucous membranes are moist.   Neck: No  stridor. Hematological/Lymphatic/Immunilogical: No cervical lymphadenopathy. Cardiovascular: Normal rate, regular rhythm.  No murmurs, rubs, or gallops. Respiratory: Normal respiratory effort without tachypnea nor retractions. Breath sounds are clear and equal bilaterally. No wheezes/rales/rhonchi. Gastrointestinal: Soft and non tender. No rebound. No guarding.  Genitourinary: Deferred Musculoskeletal: Normal range of motion in all extremities. No lower extremity edema. Neurologic:  Normal speech and language. No gross focal neurologic deficits are appreciated.  Skin:  Skin is warm, dry and intact. No rash noted. Psychiatric: Mood and affect are normal. Speech and behavior are normal. Patient exhibits appropriate insight and judgment.  ____________________________________________    LABS (pertinent positives/negatives)  Trop <0.03 BMP k 5.3, glu 255, cr 1.41 CBC wbc 8.6, hgb 10.8, plt 191 Lactic acid 1.3 ____________________________________________   EKG  I, Nance Pear, attending physician, personally viewed and interpreted this EKG  EKG Time: 1144 Rate: 73 Rhythm: ventricular paced rhythm Axis: left axis deviation Intervals: qtc 502 QRS: wide ST changes: no st elevation Impression: abnormal ekg   ____________________________________________    RADIOLOGY  CXR No acute disease  I, Dorsey Charette, personally viewed and evaluated these images (plain radiographs) as part of my medical decision making. ____________________________________________   PROCEDURES  Procedures  ____________________________________________   INITIAL IMPRESSION / ASSESSMENT AND PLAN / ED COURSE  Pertinent labs & imaging results that were available during my care of the patient were reviewed by me and considered in my medical decision making (see chart for details).  Patient presents to the emergency department today with concerns for weakness.  Workup without any obvious  etiology.  No signs of infection at this time.  Electrolytes non-concerning.  Patient does not appear ill at all.  They do wonder if this could be secondary to Bactrim use.  At this point do not feel patient requires admission.  Discussed plan with patient and family to stop taking Bactrim.  ____________________________________________   FINAL CLINICAL IMPRESSION(S) / ED DIAGNOSES  Final diagnoses:  Weakness     Note: This dictation was prepared with Dragon dictation. Any transcriptional errors that result from this process are unintentional     Nance Pear, MD 07/28/17 1645

## 2017-07-28 NOTE — Discharge Instructions (Signed)
Please seek medical attention for any high fevers, chest pain, shortness of breath, change in behavior, persistent vomiting, bloody stool or any other new or concerning symptoms.  

## 2017-07-28 NOTE — ED Triage Notes (Addendum)
Patient states "I feel bad with no energy and SOB" Patient just released from hospital on Janurary 26th for sepsis and UTI and over the weekend started feeling worse. PCP told patient to come back to hospital. Patient takes blood thinner daily. Family reports when patient has pnmeumonia it never shows up on a chest Xray but does a CT scan

## 2017-07-28 NOTE — ED Notes (Signed)
Pt taken to POV in Wheelchair. She is with her daughter. NAD. VSS. Instructions and follow up reviewed. All questions and concerns answered.

## 2017-11-28 ENCOUNTER — Ambulatory Visit
Admission: EM | Admit: 2017-11-28 | Discharge: 2017-11-28 | Disposition: A | Discharge status: Home / Self Care | Payer: Medicare Other | Attending: Family Medicine | Admitting: Family Medicine

## 2017-11-28 ENCOUNTER — Other Ambulatory Visit: Payer: Self-pay

## 2017-11-28 ENCOUNTER — Ambulatory Visit (INDEPENDENT_AMBULATORY_CARE_PROVIDER_SITE_OTHER): Payer: Medicare Other

## 2017-11-28 DIAGNOSIS — I4891 Unspecified atrial fibrillation: Secondary | ICD-10-CM | POA: Diagnosis not present

## 2017-11-28 DIAGNOSIS — M549 Dorsalgia, unspecified: Secondary | ICD-10-CM | POA: Insufficient documentation

## 2017-11-28 DIAGNOSIS — Z7982 Long term (current) use of aspirin: Secondary | ICD-10-CM | POA: Diagnosis not present

## 2017-11-28 DIAGNOSIS — J449 Chronic obstructive pulmonary disease, unspecified: Secondary | ICD-10-CM | POA: Insufficient documentation

## 2017-11-28 DIAGNOSIS — E871 Hypo-osmolality and hyponatremia: Secondary | ICD-10-CM | POA: Diagnosis not present

## 2017-11-28 DIAGNOSIS — L97509 Non-pressure chronic ulcer of other part of unspecified foot with unspecified severity: Secondary | ICD-10-CM | POA: Insufficient documentation

## 2017-11-28 DIAGNOSIS — Z881 Allergy status to other antibiotic agents status: Secondary | ICD-10-CM | POA: Diagnosis not present

## 2017-11-28 DIAGNOSIS — E119 Type 2 diabetes mellitus without complications: Secondary | ICD-10-CM | POA: Diagnosis not present

## 2017-11-28 DIAGNOSIS — Z7984 Long term (current) use of oral hypoglycemic drugs: Secondary | ICD-10-CM | POA: Insufficient documentation

## 2017-11-28 DIAGNOSIS — I495 Sick sinus syndrome: Secondary | ICD-10-CM | POA: Insufficient documentation

## 2017-11-28 DIAGNOSIS — Z79899 Other long term (current) drug therapy: Secondary | ICD-10-CM | POA: Diagnosis not present

## 2017-11-28 DIAGNOSIS — R42 Dizziness and giddiness: Secondary | ICD-10-CM | POA: Insufficient documentation

## 2017-11-28 DIAGNOSIS — R531 Weakness: Secondary | ICD-10-CM | POA: Diagnosis not present

## 2017-11-28 DIAGNOSIS — G8929 Other chronic pain: Secondary | ICD-10-CM | POA: Diagnosis not present

## 2017-11-28 DIAGNOSIS — K439 Ventral hernia without obstruction or gangrene: Secondary | ICD-10-CM | POA: Insufficient documentation

## 2017-11-28 DIAGNOSIS — I5042 Chronic combined systolic (congestive) and diastolic (congestive) heart failure: Secondary | ICD-10-CM | POA: Insufficient documentation

## 2017-11-28 DIAGNOSIS — I517 Cardiomegaly: Secondary | ICD-10-CM | POA: Diagnosis not present

## 2017-11-28 DIAGNOSIS — Z87440 Personal history of urinary (tract) infections: Secondary | ICD-10-CM | POA: Diagnosis not present

## 2017-11-28 DIAGNOSIS — Z885 Allergy status to narcotic agent status: Secondary | ICD-10-CM | POA: Insufficient documentation

## 2017-11-28 DIAGNOSIS — I110 Hypertensive heart disease with heart failure: Secondary | ICD-10-CM | POA: Insufficient documentation

## 2017-11-28 LAB — COMPREHENSIVE METABOLIC PANEL
ALT: 14 U/L (ref 14–54)
AST: 20 U/L (ref 15–41)
Albumin: 3.8 g/dL (ref 3.5–5.0)
Alkaline Phosphatase: 83 U/L (ref 38–126)
Anion gap: 12 (ref 5–15)
BUN: 43 mg/dL — AB (ref 6–20)
CHLORIDE: 101 mmol/L (ref 101–111)
CO2: 28 mmol/L (ref 22–32)
CREATININE: 1.19 mg/dL — AB (ref 0.44–1.00)
Calcium: 9.3 mg/dL (ref 8.9–10.3)
GFR calc Af Amer: 45 mL/min — ABNORMAL LOW (ref >60)
GFR calc non Af Amer: 39 mL/min — ABNORMAL LOW (ref >60)
Glucose, Bld: 198 mg/dL — ABNORMAL HIGH (ref 65–99)
Potassium: 4.3 mmol/L (ref 3.5–5.1)
SODIUM: 141 mmol/L (ref 135–145)
Total Bilirubin: 0.7 mg/dL (ref 0.3–1.2)
Total Protein: 7.7 g/dL (ref 6.5–8.1)

## 2017-11-28 LAB — CBC WITH DIFFERENTIAL/PLATELET
BASOS ABS: 0.1 10*3/uL (ref 0–0.1)
BASOS PCT: 2 %
EOS ABS: 0.4 10*3/uL (ref 0–0.7)
EOS PCT: 6 %
HCT: 37.4 % (ref 35.0–47.0)
Hemoglobin: 12.3 g/dL (ref 12.0–16.0)
Lymphocytes Relative: 24 %
Lymphs Abs: 1.5 10*3/uL (ref 1.0–3.6)
MCH: 29.4 pg (ref 26.0–34.0)
MCHC: 32.9 g/dL (ref 32.0–36.0)
MCV: 89.4 fL (ref 80.0–100.0)
Monocytes Absolute: 0.7 10*3/uL (ref 0.2–0.9)
Monocytes Relative: 11 %
Neutro Abs: 3.6 10*3/uL (ref 1.4–6.5)
Neutrophils Relative %: 57 %
PLATELETS: 137 10*3/uL — AB (ref 150–440)
RBC: 4.18 MIL/uL (ref 3.80–5.20)
RDW: 17.5 % — ABNORMAL HIGH (ref 11.5–14.5)
WBC: 6.3 10*3/uL (ref 3.6–11.0)

## 2017-11-28 LAB — URINALYSIS, COMPLETE (UACMP) WITH MICROSCOPIC
BILIRUBIN URINE: NEGATIVE
Bacteria, UA: NONE SEEN
Glucose, UA: 100 mg/dL — AB
Hgb urine dipstick: NEGATIVE
Ketones, ur: NEGATIVE mg/dL
NITRITE: NEGATIVE
PROTEIN: NEGATIVE mg/dL
RBC / HPF: NONE SEEN RBC/hpf (ref 0–5)
Specific Gravity, Urine: 1.01 (ref 1.005–1.030)
pH: 5.5 (ref 5.0–8.0)

## 2017-11-28 LAB — GLUCOSE, CAPILLARY: GLUCOSE-CAPILLARY: 225 mg/dL — AB (ref 65–99)

## 2017-11-28 NOTE — ED Triage Notes (Signed)
Pt with weakness after PT this a.m. And then felt dizzy, mostly resolved but not completely. BS was elevated this morning. Bottom of left foot with ulcer treated by Dr. Elvina Mattes than daughter is concerned about being infected. Caregiver states pt had a headache this a.m.

## 2017-11-28 NOTE — ED Provider Notes (Signed)
[facility]    CSN: [id number] Arrival date & time: 11/28/17  1244  History   Chief Complaint Chief Complaint  Patient presents with  . Weakness   HPI  84 year old female with a complex medical history including diabetes, chronic back pain, congestive heart failure, hypertension, ongoing foot ulcer, sick sinus syndrome, COPD presents with weakness.  Patient states that she has not felt well since after completing physical therapy early this morning.  She states that she feels dizzy which she describes as unsteadiness.  Caregiver states that she seems awake.  She has been more sleepy.  She has reported headache but her headache is currently resolved.  No reports of fall, trauma, injury.  Blood sugar has been elevated.  No hypoglycemia.  She has eaten breakfast but no lunch.  No reports of urinary symptoms.  No other reported symptoms.  No other complaints or concerns at this time.  Past Medical History:  Diagnosis Date  . Cancer ([place])    skin ca  . CHF (congestive heart failure) ([place])   . Chronic back pain   . COPD (chronic obstructive pulmonary disease) ([place])   . Diabetes mellitus without complication ([place])   . Hypertension   . Poor perfusion of leg     Patient Active Problem List   Diagnosis Date Noted  . CAP (community acquired pneumonia) 04/14/2017  . COPD (chronic obstructive pulmonary disease) ([place]) 12/07/2016  . UTI (urinary tract infection) 12/07/2016  . Ventral hernia without obstruction or gangrene   . Hyponatremia 09/07/2015  . Acute respiratory failure ([place]) 09/01/2015  . Atrial fibrillation ([place]) 04/10/2015  . Essential hypertension 04/10/2015  . Diabetes ([place]) 04/10/2015  . Acute on chronic combined systolic and diastolic CHF (congestive heart failure) ([place]) 03/21/2015  . Pulmonary nodule 11/10/2014    Past Surgical History:  Procedure Laterality Date  . ABDOMINAL HYSTERECTOMY    . BACK SURGERY     x3  . CORONARY STENT PLACEMENT     unknown  location per pt  . FOOT SURGERY    . LEG SURGERY    . PACEMAKER PLACEMENT    . TONSILLECTOMY      OB History   None      Home Medications    Prior to Admission medications   Medication Sig Start Date End Date Taking? Authorizing Provider  doxycycline (VIBRA-TABS) 100 MG tablet Take by mouth. 11/19/17 11/29/17 Yes [provider]  acetaminophen (TYLENOL) 325 MG tablet Take 2 tablets (650 mg total) by mouth every 6 (six) hours as needed for mild pain (or Fever >/= 101). 04/18/17   Nicholes Mango, MD  aspirin EC 81 MG tablet Take 81 mg by mouth daily.    [provider]  baclofen (LIORESAL) 10 MG tablet Take 10 mg by mouth 3 (three) times daily.    [provider]  carvedilol (COREG) 6.25 MG tablet Take 6.25 mg by mouth 2 (two) times daily with a meal.     [provider]  cholecalciferol (VITAMIN D) 1000 units tablet Take 1,000 Units by mouth daily.    [provider]  docusate sodium (COLACE) 100 MG capsule Take 100 mg by mouth 2 (two) times daily.     [provider]  escitalopram (LEXAPRO) 10 MG tablet Take 10 mg by mouth daily.    [provider]  ferrous sulfate 325 (65 FE) MG tablet Take 325 mg by mouth daily with breakfast.    [provider]  furosemide (LASIX) 20 MG  tablet Take 1 tablet (20 mg total) by mouth daily. Pt alternates with the 80mg  tablet. Patient taking differently: Take 80 mg by mouth daily.  04/18/17   Nicholes Mango, MD  gabapentin (NEURONTIN) 100 MG capsule Take 100-200 mg by mouth at bedtime.    [provider]  glimepiride (AMARYL) 4 MG tablet Take 4 mg by mouth 2 (two) times daily. 07/01/17   [provider]  GLUCERNA (GLUCERNA) LIQD Take 237 mLs by mouth 2 (two) times daily between meals. Patient not taking: Reported on 07/15/2017 04/18/17   Nicholes Mango, MD  lactobacillus (FLORANEX/LACTINEX) PACK Take 1 packet (1 g total) by mouth 3 (three) times daily with meals. 07/19/17    Nicholes Mango, MD  loperamide (IMODIUM) 2 MG capsule Take 1 capsule (2 mg total) by mouth every 8 (eight) hours as needed for diarrhea or loose stools. 07/19/17   Nicholes Mango, MD  magnesium oxide (MAG-OX) 400 MG tablet Take 400 mg by mouth daily.    [provider]  Melatonin 1 MG TABS Take 1 mg by mouth at bedtime.    [provider]  Multiple Vitamin (MULTIVITAMIN) tablet Take 1 tablet by mouth daily.    [provider]  oxyCODONE (OXY IR/ROXICODONE) 5 MG immediate release tablet Take 1 tablet (5 mg total) by mouth every 6 (six) hours as needed for moderate pain. 04/18/17   Gouru, Illene Silver, MD  pantoprazole (PROTONIX) 40 MG tablet Take 40 mg by mouth daily.    [provider]  potassium chloride SA (K-DUR,KLOR-CON) 20 MEQ tablet Take 20 mEq by mouth 2 (two) times daily. 06/25/17   [provider]  PROAIR HFA 108 [phone] Base) MCG/ACT inhaler  10/02/17   [provider]  Rivaroxaban (XARELTO) 15 MG TABS tablet Take 15 mg by mouth daily.    [provider]  rOPINIRole (REQUIP) 0.5 MG tablet Take 0.5 mg by mouth 2 (two) times daily.    [provider]  sitaGLIPtin (JANUVIA) 100 MG tablet Take 100 mg by mouth daily. 12/06/16   [provider]  spironolactone (ALDACTONE) 25 MG tablet Take 25 mg by mouth daily.     [provider]  vitamin B-12 (CYANOCOBALAMIN) 1000 MCG tablet Take 1,000 mcg by mouth daily.    [provider]  vitamin C (ASCORBIC ACID) 500 MG tablet Take 500 mg by mouth daily.    [provider]  Vitamin D, Ergocalciferol, (DRISDOL) 50000 UNITS CAPS capsule Take 50,000 Units by mouth every 7 (seven) days. Pt takes on Thursday.    [provider]    Family History Family History  Problem Relation Age of Onset  . CVA Mother        Congestive Heart Failure, heart attack, hypertension, stroke  . Diabetes Mellitus II Sister   . Heart disease Father        Congestive Heart Failure,  heart attack, hypertension    Social History Social History   Tobacco Use  . Smoking status: Never Smoker  . Smokeless tobacco: Never Used  Substance Use Topics  . Alcohol use: No    Alcohol/week: 0.0 oz  . Drug use: No    Allergies   Ceftriaxone; Cefuroxime; Cephalosporins; Codeine; Epinephrine; Morphine and related; Buprenorphine hcl; Ciprofloxacin; Latex; Lidocaine; and Procaine  Review of Systems Review of Systems  Constitutional: Negative.   Genitourinary: Negative.   Neurological: Positive for dizziness and weakness.   Physical Exam Triage Vital Signs ED Triage Vitals  Enc Vitals Group  BP 11/28/17 1259 135/63     Pulse Rate 11/28/17 1259 71     Resp 11/28/17 1259 20     Temp 11/28/17 1259 97.6 F (36.4 C)     Temp Source 11/28/17 1259 Oral     SpO2 11/28/17 1259 98 %     Weight 11/28/17 1300 175 lb (79.4 kg)     Height 11/28/17 1300 5\' 7"  (1.702 m)     Head Circumference --      Peak Flow --      Pain Score 11/28/17 1300 0     Pain Loc --      Pain Edu? --      Excl. in [place]? --    Updated Vital Signs BP 135/63 (BP Location: Left Arm)   Pulse 71   Temp 97.6 F (36.4 C) (Oral)   Resp 20   Ht 5\' 7"  (1.702 m)   Wt 175 lb (79.4 kg)   SpO2 98%   BMI 27.41 kg/m   Physical Exam  Constitutional: She appears well-developed. No distress.  HENT:  Head: Normocephalic and atraumatic.  Nose: Nose normal.  Eyes: Conjunctivae and EOM are normal. Right eye exhibits no discharge. Left eye exhibits no discharge.  Cardiovascular: Normal rate and regular rhythm.  Pulmonary/Chest: Effort normal.  Scattered crackles.  No increased work of breathing.  Abdominal: Soft. She exhibits no distension. There is no tenderness.  Neurological:  Patient is alert.  Answering questions appropriately.  Skin:  Left foot with a small ulceration on the medial aspect of the plantar surface of the 1st MTP.  No drainage.  No erythema.  Psychiatric:  Flat affect.  Normal behavior.    Nursing note and vitals reviewed.  UC Treatments / Results  Labs (all labs ordered are listed, but only abnormal results are displayed) Labs Reviewed  GLUCOSE, CAPILLARY - Abnormal; Notable for the following components:      Result Value   Glucose-Capillary 225 (*)    All other components within normal limits  CBC WITH DIFFERENTIAL/PLATELET - Abnormal; Notable for the following components:   RDW 17.5 (*)    Platelets 137 (*)    All other components within normal limits  COMPREHENSIVE METABOLIC PANEL - Abnormal; Notable for the following components:   Glucose, Bld 198 (*)    BUN 43 (*)    Creatinine, Ser 1.19 (*)    GFR calc non Af Amer 39 (*)    GFR calc Af Amer 45 (*)    All other components within normal limits  URINALYSIS, COMPLETE (UACMP) WITH MICROSCOPIC - Abnormal; Notable for the following components:   Color, Urine STRAW (*)    Glucose, UA 100 (*)    Leukocytes, UA SMALL (*)    All other components within normal limits  URINE CULTURE    EKG None  Radiology Dg Chest 2 View  Result Date: 11/28/2017 CLINICAL DATA:  Weakness. EXAM: CHEST - 2 VIEW COMPARISON:  PA and lateral chest 07/28/2017. FINDINGS: The lungs are clear. Heart size is mildly enlarged. Aortic atherosclerosis noted. Pacing device in place. No pneumothorax or pleural effusion. IMPRESSION: Mild cardiomegaly without acute disease. Atherosclerosis. Electronically Signed   By: Inge Rise M.D.   On: 11/28/2017 14:01    Procedures Procedures (including critical care time)  Medications Ordered in UC Medications - No data to display  Initial Impression / Assessment and Plan / UC Course  I have reviewed the triage vital signs and the nursing notes.  Pertinent labs &  imaging results that were available during my care of the patient were reviewed by me and considered in my medical decision making (see chart for details).    84 year old female presents with weakness and dizziness.  She is  well-appearing.  Exam without focal signs of infection.  No elevated white count.  Urinalysis not suggestive of UTI.  Chest x-ray without acute findings.  Metabolic panel with creatinine at baseline.  Glucose mildly elevated but this is also consistent with her current A1c.  There is no signs of infection.  Work-up essentially unrevealing.  Advised close monitoring and to go to the hospital if she fails to improve or worsens.  No indication for neuroimaging at this time.  Supportive care.  Final Clinical Impressions(s) / UC Diagnoses   Final diagnoses:  Weakness     Discharge Instructions     Rest.  Keep a close eye on sugars.  If worsens, go to the ER.  Take care  Dr. Lacinda Axon    ED Prescriptions    None     Controlled Substance Prescriptions [place] Controlled Substance Registry consulted? Not Applicable   Coral Spikes, DO 11/28/17 1514

## 2017-11-28 NOTE — Discharge Instructions (Signed)
Rest.  Keep a close eye on sugars.  If worsens, go to the ER.  Take care  Dr. Lacinda Axon

## 2017-11-29 LAB — URINE CULTURE

## 2017-12-11 ENCOUNTER — Encounter: Payer: Self-pay | Admitting: *Deleted

## 2017-12-11 ENCOUNTER — Inpatient Hospital Stay: Payer: Medicare Other

## 2017-12-11 ENCOUNTER — Other Ambulatory Visit: Payer: Self-pay

## 2017-12-11 ENCOUNTER — Emergency Department: Payer: Medicare Other

## 2017-12-11 ENCOUNTER — Inpatient Hospital Stay
Admission: EM | Admit: 2017-12-11 | Discharge: 2017-12-15 | DRG: 871 | Disposition: A | Discharge status: Skilled Nursing Facility | Payer: Medicare Other | Attending: Internal Medicine | Admitting: Internal Medicine

## 2017-12-11 DIAGNOSIS — Z66 Do not resuscitate: Secondary | ICD-10-CM | POA: Diagnosis present

## 2017-12-11 DIAGNOSIS — J810 Acute pulmonary edema: Secondary | ICD-10-CM | POA: Diagnosis present

## 2017-12-11 DIAGNOSIS — J441 Chronic obstructive pulmonary disease with (acute) exacerbation: Secondary | ICD-10-CM | POA: Diagnosis present

## 2017-12-11 DIAGNOSIS — Z955 Presence of coronary angioplasty implant and graft: Secondary | ICD-10-CM | POA: Diagnosis not present

## 2017-12-11 DIAGNOSIS — Z8249 Family history of ischemic heart disease and other diseases of the circulatory system: Secondary | ICD-10-CM

## 2017-12-11 DIAGNOSIS — G9341 Metabolic encephalopathy: Secondary | ICD-10-CM | POA: Diagnosis present

## 2017-12-11 DIAGNOSIS — J9621 Acute and chronic respiratory failure with hypoxia: Secondary | ICD-10-CM | POA: Diagnosis present

## 2017-12-11 DIAGNOSIS — R6520 Severe sepsis without septic shock: Secondary | ICD-10-CM | POA: Diagnosis present

## 2017-12-11 DIAGNOSIS — K219 Gastro-esophageal reflux disease without esophagitis: Secondary | ICD-10-CM | POA: Diagnosis present

## 2017-12-11 DIAGNOSIS — Z90710 Acquired absence of both cervix and uterus: Secondary | ICD-10-CM

## 2017-12-11 DIAGNOSIS — E1122 Type 2 diabetes mellitus with diabetic chronic kidney disease: Secondary | ICD-10-CM | POA: Diagnosis present

## 2017-12-11 DIAGNOSIS — E1165 Type 2 diabetes mellitus with hyperglycemia: Secondary | ICD-10-CM | POA: Diagnosis present

## 2017-12-11 DIAGNOSIS — N182 Chronic kidney disease, stage 2 (mild): Secondary | ICD-10-CM | POA: Diagnosis present

## 2017-12-11 DIAGNOSIS — I482 Chronic atrial fibrillation: Secondary | ICD-10-CM | POA: Diagnosis present

## 2017-12-11 DIAGNOSIS — A4151 Sepsis due to Escherichia coli [E. coli]: Secondary | ICD-10-CM | POA: Diagnosis present

## 2017-12-11 DIAGNOSIS — Z9981 Dependence on supplemental oxygen: Secondary | ICD-10-CM

## 2017-12-11 DIAGNOSIS — Z95828 Presence of other vascular implants and grafts: Secondary | ICD-10-CM

## 2017-12-11 DIAGNOSIS — Z950 Presence of cardiac pacemaker: Secondary | ICD-10-CM | POA: Diagnosis not present

## 2017-12-11 DIAGNOSIS — I071 Rheumatic tricuspid insufficiency: Secondary | ICD-10-CM | POA: Diagnosis present

## 2017-12-11 DIAGNOSIS — N179 Acute kidney failure, unspecified: Secondary | ICD-10-CM | POA: Diagnosis present

## 2017-12-11 DIAGNOSIS — F419 Anxiety disorder, unspecified: Secondary | ICD-10-CM | POA: Diagnosis present

## 2017-12-11 DIAGNOSIS — D696 Thrombocytopenia, unspecified: Secondary | ICD-10-CM | POA: Diagnosis present

## 2017-12-11 DIAGNOSIS — I5033 Acute on chronic diastolic (congestive) heart failure: Secondary | ICD-10-CM | POA: Diagnosis present

## 2017-12-11 DIAGNOSIS — Z7984 Long term (current) use of oral hypoglycemic drugs: Secondary | ICD-10-CM | POA: Diagnosis not present

## 2017-12-11 DIAGNOSIS — Z79899 Other long term (current) drug therapy: Secondary | ICD-10-CM

## 2017-12-11 DIAGNOSIS — J440 Chronic obstructive pulmonary disease with acute lower respiratory infection: Secondary | ICD-10-CM | POA: Diagnosis present

## 2017-12-11 DIAGNOSIS — M549 Dorsalgia, unspecified: Secondary | ICD-10-CM | POA: Diagnosis present

## 2017-12-11 DIAGNOSIS — Z7901 Long term (current) use of anticoagulants: Secondary | ICD-10-CM | POA: Diagnosis not present

## 2017-12-11 DIAGNOSIS — J189 Pneumonia, unspecified organism: Secondary | ICD-10-CM

## 2017-12-11 DIAGNOSIS — I5043 Acute on chronic combined systolic (congestive) and diastolic (congestive) heart failure: Secondary | ICD-10-CM

## 2017-12-11 DIAGNOSIS — D691 Qualitative platelet defects: Secondary | ICD-10-CM | POA: Diagnosis not present

## 2017-12-11 DIAGNOSIS — I130 Hypertensive heart and chronic kidney disease with heart failure and stage 1 through stage 4 chronic kidney disease, or unspecified chronic kidney disease: Secondary | ICD-10-CM | POA: Diagnosis present

## 2017-12-11 DIAGNOSIS — R140 Abdominal distension (gaseous): Secondary | ICD-10-CM

## 2017-12-11 DIAGNOSIS — Z7982 Long term (current) use of aspirin: Secondary | ICD-10-CM | POA: Diagnosis not present

## 2017-12-11 DIAGNOSIS — R52 Pain, unspecified: Secondary | ICD-10-CM

## 2017-12-11 DIAGNOSIS — G8929 Other chronic pain: Secondary | ICD-10-CM | POA: Diagnosis present

## 2017-12-11 DIAGNOSIS — Z833 Family history of diabetes mellitus: Secondary | ICD-10-CM | POA: Diagnosis not present

## 2017-12-11 DIAGNOSIS — R531 Weakness: Secondary | ICD-10-CM

## 2017-12-11 LAB — CBC
HCT: 35.7 % (ref 35.0–47.0)
Hemoglobin: 11.7 g/dL — ABNORMAL LOW (ref 12.0–16.0)
MCH: 29.7 pg (ref 26.0–34.0)
MCHC: 32.8 g/dL (ref 32.0–36.0)
MCV: 90.5 fL (ref 80.0–100.0)
PLATELETS: 117 10*3/uL — AB (ref 150–440)
RBC: 3.94 MIL/uL (ref 3.80–5.20)
RDW: 17.6 % — ABNORMAL HIGH (ref 11.5–14.5)
WBC: 19.4 10*3/uL — ABNORMAL HIGH (ref 3.6–11.0)

## 2017-12-11 LAB — HEPATIC FUNCTION PANEL
ALBUMIN: 3.6 g/dL (ref 3.5–5.0)
ALT: 13 U/L — AB (ref 14–54)
AST: 21 U/L (ref 15–41)
Alkaline Phosphatase: 64 U/L (ref 38–126)
Bilirubin, Direct: 0.2 mg/dL (ref 0.1–0.5)
Indirect Bilirubin: 0.7 mg/dL (ref 0.3–0.9)
TOTAL PROTEIN: 6.9 g/dL (ref 6.5–8.1)
Total Bilirubin: 0.9 mg/dL (ref 0.3–1.2)

## 2017-12-11 LAB — BASIC METABOLIC PANEL
Anion gap: 10 (ref 5–15)
BUN: 39 mg/dL — AB (ref 6–20)
CHLORIDE: 102 mmol/L (ref 101–111)
CO2: 24 mmol/L (ref 22–32)
CREATININE: 1.13 mg/dL — AB (ref 0.44–1.00)
Calcium: 9.2 mg/dL (ref 8.9–10.3)
GFR calc non Af Amer: 41 mL/min — ABNORMAL LOW (ref >60)
GFR, EST AFRICAN AMERICAN: 48 mL/min — AB (ref >60)
Glucose, Bld: 370 mg/dL — ABNORMAL HIGH (ref 65–99)
Potassium: 4.5 mmol/L (ref 3.5–5.1)
Sodium: 136 mmol/L (ref 135–145)

## 2017-12-11 LAB — GLUCOSE, CAPILLARY
GLUCOSE-CAPILLARY: 374 mg/dL — AB (ref 65–99)
GLUCOSE-CAPILLARY: 292 mg/dL — AB (ref 65–99)
Glucose-Capillary: 252 mg/dL — ABNORMAL HIGH (ref 65–99)

## 2017-12-11 LAB — URINALYSIS, COMPLETE (UACMP) WITH MICROSCOPIC
Bacteria, UA: NONE SEEN
Bilirubin Urine: NEGATIVE
Glucose, UA: >=500 mg/dL — AB
Hgb urine dipstick: NEGATIVE
Ketones, ur: NEGATIVE mg/dL
Leukocytes, UA: NEGATIVE
Nitrite: NEGATIVE
PROTEIN: NEGATIVE mg/dL
SQUAMOUS EPITHELIAL / LPF: NONE SEEN (ref 0–5)
Specific Gravity, Urine: 1.018 (ref 1.005–1.030)
pH: 5 (ref 5.0–8.0)

## 2017-12-11 LAB — LACTIC ACID, PLASMA: Lactic Acid, Venous: 2.5 mmol/L (ref 0.5–1.9)

## 2017-12-11 LAB — TROPONIN I: TROPONIN I: 0.04 ng/mL — AB (ref <0.03)

## 2017-12-11 MED ORDER — POLYETHYLENE GLYCOL 3350 17 G PO PACK: 17.0000 g | PACK | Freq: Every day | ORAL | Status: DC | PRN

## 2017-12-11 MED ORDER — ROPINIROLE HCL 1 MG PO TABS
0.5000 mg | ORAL_TABLET | Freq: Two times a day (BID) | ORAL | Status: DC
  Administered 2017-12-12 – 2017-12-15 (×7): 0.5 mg via ORAL
  Filled 2017-12-11: qty 2
  Filled 2017-12-11 (×2): qty 1
  Filled 2017-12-11 (×2): qty 2
  Filled 2017-12-11: qty 1
  Filled 2017-12-11: qty 2
  Filled 2017-12-11: qty 1
  Filled 2017-12-11: qty 2

## 2017-12-11 MED ORDER — CARVEDILOL 3.125 MG PO TABS
6.2500 mg | ORAL_TABLET | Freq: Two times a day (BID) | ORAL | Status: DC
  Administered 2017-12-13 – 2017-12-15 (×6): 6.25 mg via ORAL
  Filled 2017-12-11 (×4): qty 2
  Filled 2017-12-11: qty 1
  Filled 2017-12-11: qty 2

## 2017-12-11 MED ORDER — ESCITALOPRAM OXALATE 10 MG PO TABS
10.0000 mg | ORAL_TABLET | Freq: Every day | ORAL | Status: DC
  Administered 2017-12-12 – 2017-12-15 (×4): 10 mg via ORAL
  Filled 2017-12-11 (×4): qty 1

## 2017-12-11 MED ORDER — BACLOFEN 10 MG PO TABS
10.0000 mg | ORAL_TABLET | Freq: Three times a day (TID) | ORAL | Status: DC | PRN
  Filled 2017-12-11: qty 1

## 2017-12-11 MED ORDER — RIVAROXABAN 15 MG PO TABS
15.0000 mg | ORAL_TABLET | Freq: Every day | ORAL | Status: DC
  Administered 2017-12-12 – 2017-12-15 (×4): 15 mg via ORAL
  Filled 2017-12-11 (×4): qty 1

## 2017-12-11 MED ORDER — GABAPENTIN 100 MG PO CAPS
100.0000 mg | ORAL_CAPSULE | Freq: Every day | ORAL | Status: DC
  Filled 2017-12-11: qty 2

## 2017-12-11 MED ORDER — AZTREONAM 2 G IJ SOLR
1.0000 g | Freq: Three times a day (TID) | INTRAMUSCULAR | Status: DC
Start: 2017-12-11 — End: 2017-12-11

## 2017-12-11 MED ORDER — ENOXAPARIN SODIUM 40 MG/0.4ML ~~LOC~~ SOLN
40.0000 mg | SUBCUTANEOUS | Status: DC
Start: 2017-12-11 — End: 2017-12-11

## 2017-12-11 MED ORDER — MELATONIN 5 MG PO TABS
2.5000 mg | ORAL_TABLET | Freq: Every day | ORAL | Status: DC
  Administered 2017-12-12 – 2017-12-14 (×3): 2.5 mg via ORAL
  Filled 2017-12-11 (×6): qty 0.5

## 2017-12-11 MED ORDER — SODIUM CHLORIDE 0.9 % IV SOLN
1.0000 g | Freq: Three times a day (TID) | INTRAVENOUS | Status: DC
  Administered 2017-12-11: 1 g via INTRAVENOUS
  Filled 2017-12-11 (×5): qty 1

## 2017-12-11 MED ORDER — INSULIN ASPART 100 UNIT/ML ~~LOC~~ SOLN
0.0000 [IU] | SUBCUTANEOUS | Status: DC
  Administered 2017-12-11: 8 [IU] via SUBCUTANEOUS
  Administered 2017-12-12: 3 [IU] via SUBCUTANEOUS
  Administered 2017-12-12: 2 [IU] via SUBCUTANEOUS
  Administered 2017-12-12 (×2): 3 [IU] via SUBCUTANEOUS
  Administered 2017-12-12 (×2): 2 [IU] via SUBCUTANEOUS
  Administered 2017-12-13: 5 [IU] via SUBCUTANEOUS
  Administered 2017-12-13 (×3): 3 [IU] via SUBCUTANEOUS
  Administered 2017-12-13: 21:00:00 5 [IU] via SUBCUTANEOUS
  Administered 2017-12-13: 3 [IU] via SUBCUTANEOUS
  Administered 2017-12-14: 2 [IU] via SUBCUTANEOUS
  Administered 2017-12-14 (×2): 3 [IU] via SUBCUTANEOUS
  Administered 2017-12-14: 13:00:00 5 [IU] via SUBCUTANEOUS
  Administered 2017-12-14: 08:00:00 2 [IU] via SUBCUTANEOUS
  Administered 2017-12-15: 5 [IU] via SUBCUTANEOUS
  Administered 2017-12-15 (×2): 3 [IU] via SUBCUTANEOUS
  Administered 2017-12-15: 2 [IU] via SUBCUTANEOUS
  Filled 2017-12-11 (×22): qty 1

## 2017-12-11 MED ORDER — ONDANSETRON HCL 4 MG/2ML IJ SOLN: 4.0000 mg | Freq: Four times a day (QID) | INTRAMUSCULAR | Status: DC | PRN

## 2017-12-11 MED ORDER — LORAZEPAM 2 MG/ML IJ SOLN
0.5000 mg | Freq: Once | INTRAMUSCULAR | Status: AC
  Administered 2017-12-11: 0.5 mg via INTRAVENOUS

## 2017-12-11 MED ORDER — PANTOPRAZOLE SODIUM 40 MG PO TBEC
40.0000 mg | DELAYED_RELEASE_TABLET | Freq: Every day | ORAL | Status: DC
  Administered 2017-12-12 – 2017-12-15 (×4): 40 mg via ORAL
  Filled 2017-12-11 (×4): qty 1

## 2017-12-11 MED ORDER — SODIUM CHLORIDE 0.9% FLUSH
3.0000 mL | Freq: Two times a day (BID) | INTRAVENOUS | Status: DC
  Administered 2017-12-12 – 2017-12-15 (×7): 3 mL via INTRAVENOUS

## 2017-12-11 MED ORDER — SPIRONOLACTONE 25 MG PO TABS
25.0000 mg | ORAL_TABLET | Freq: Every day | ORAL | Status: DC
  Administered 2017-12-12 – 2017-12-15 (×4): 25 mg via ORAL
  Filled 2017-12-11 (×6): qty 1

## 2017-12-11 MED ORDER — ACETAMINOPHEN 650 MG RE SUPP
650.0000 mg | Freq: Four times a day (QID) | RECTAL | Status: DC | PRN
  Administered 2017-12-11: 650 mg via RECTAL
  Filled 2017-12-11: qty 1

## 2017-12-11 MED ORDER — ONDANSETRON HCL 4 MG PO TABS: 4.0000 mg | ORAL_TABLET | Freq: Four times a day (QID) | ORAL | Status: DC | PRN

## 2017-12-11 MED ORDER — LORAZEPAM 2 MG/ML IJ SOLN
INTRAMUSCULAR | Status: AC
  Filled 2017-12-11: qty 1

## 2017-12-11 MED ORDER — ASPIRIN EC 81 MG PO TBEC
81.0000 mg | DELAYED_RELEASE_TABLET | Freq: Every day | ORAL | Status: DC
  Administered 2017-12-12 – 2017-12-15 (×4): 81 mg via ORAL
  Filled 2017-12-11 (×4): qty 1

## 2017-12-11 MED ORDER — BUPROPION HCL ER (XL) 150 MG PO TB24
150.0000 mg | ORAL_TABLET | Freq: Every day | ORAL | Status: DC
  Administered 2017-12-13 – 2017-12-15 (×3): 150 mg via ORAL
  Filled 2017-12-11 (×5): qty 1

## 2017-12-11 MED ORDER — FUROSEMIDE 10 MG/ML IJ SOLN: 60.0000 mg | Freq: Once | INTRAMUSCULAR | Status: DC

## 2017-12-11 MED ORDER — ALBUTEROL SULFATE (2.5 MG/3ML) 0.083% IN NEBU: 2.5000 mg | INHALATION_SOLUTION | RESPIRATORY_TRACT | Status: DC | PRN

## 2017-12-11 MED ORDER — POTASSIUM CHLORIDE CRYS ER 20 MEQ PO TBCR
20.0000 meq | EXTENDED_RELEASE_TABLET | Freq: Two times a day (BID) | ORAL | Status: DC
  Administered 2017-12-12 – 2017-12-13 (×4): 20 meq via ORAL
  Filled 2017-12-11 (×4): qty 1

## 2017-12-11 MED ORDER — MAGNESIUM OXIDE 400 (241.3 MG) MG PO TABS
400.0000 mg | ORAL_TABLET | Freq: Every day | ORAL | Status: DC
Start: 2017-12-11 — End: 2017-12-15
  Administered 2017-12-12 – 2017-12-15 (×4): 400 mg via ORAL
  Filled 2017-12-11 (×4): qty 1

## 2017-12-11 MED ORDER — VANCOMYCIN HCL IN DEXTROSE 1-5 GM/200ML-% IV SOLN
1000.0000 mg | INTRAVENOUS | Status: DC
  Administered 2017-12-12: 1000 mg via INTRAVENOUS
  Filled 2017-12-11: qty 200

## 2017-12-11 MED ORDER — VANCOMYCIN HCL IN DEXTROSE 1-5 GM/200ML-% IV SOLN
1000.0000 mg | Freq: Once | INTRAVENOUS | Status: AC
  Administered 2017-12-11: 1000 mg via INTRAVENOUS
  Filled 2017-12-11: qty 200

## 2017-12-11 MED ORDER — OXYCODONE HCL 5 MG PO TABS: 5.0000 mg | ORAL_TABLET | ORAL | Status: DC | PRN

## 2017-12-11 MED ORDER — DOCUSATE SODIUM 100 MG PO CAPS
100.0000 mg | ORAL_CAPSULE | Freq: Two times a day (BID) | ORAL | Status: DC
  Administered 2017-12-12 – 2017-12-15 (×5): 100 mg via ORAL
  Filled 2017-12-11 (×6): qty 1

## 2017-12-11 MED ORDER — VANCOMYCIN HCL IN DEXTROSE 1-5 GM/200ML-% IV SOLN
1000.0000 mg | INTRAVENOUS | Status: DC
  Filled 2017-12-11: qty 200

## 2017-12-11 MED ORDER — GLIMEPIRIDE 2 MG PO TABS
2.0000 mg | ORAL_TABLET | Freq: Two times a day (BID) | ORAL | Status: DC
  Filled 2017-12-11 (×2): qty 1

## 2017-12-11 MED ORDER — FUROSEMIDE 10 MG/ML IJ SOLN: 60.0000 mg | Freq: Every day | INTRAMUSCULAR | Status: DC

## 2017-12-11 MED ORDER — ACETAMINOPHEN 325 MG PO TABS
650.0000 mg | ORAL_TABLET | Freq: Four times a day (QID) | ORAL | Status: DC | PRN
  Administered 2017-12-12: 650 mg via ORAL
  Filled 2017-12-11: qty 2

## 2017-12-11 NOTE — ED Provider Notes (Addendum)
[facility] Emergency Department Provider Note  Time seen: 8:01 PM  I have reviewed the triage vital signs and the nursing notes.   HISTORY  Chief Complaint Weakness and Hyperglycemia    HPI [name] is a 84 y.o. female with a past medical history of CHF, COPD, diabetes, hypertension presents to the emergency department for generalized fatigue and weakness that started today.  According to patient since this morning she has felt generalized malaise, fatigue, weak, has no energy to get up and about.  Patient denies any known fever.  Denies any cough or congestion.  Denies nausea, vomiting, diarrhea.  States she has had urinary frequency but denies dysuria.   Past Medical History:  Diagnosis Date  . Cancer ([place])    skin ca  . CHF (congestive heart failure) ([place])   . Chronic back pain   . COPD (chronic obstructive pulmonary disease) ([place])   . Diabetes mellitus without complication ([place])   . Hypertension   . Poor perfusion of leg     Patient Active Problem List   Diagnosis Date Noted  . CAP (community acquired pneumonia) 04/14/2017  . COPD (chronic obstructive pulmonary disease) ([place]) 12/07/2016  . UTI (urinary tract infection) 12/07/2016  . Ventral hernia without obstruction or gangrene   . Hyponatremia 09/07/2015  . Acute respiratory failure ([place]) 09/01/2015  . Atrial fibrillation ([place]) 04/10/2015  . Essential hypertension 04/10/2015  . Diabetes ([place]) 04/10/2015  . Acute on chronic combined systolic and diastolic CHF (congestive heart failure) ([place]) 03/21/2015  . Pulmonary nodule 11/10/2014    Past Surgical History:  Procedure Laterality Date  . ABDOMINAL HYSTERECTOMY    . BACK SURGERY     x3  . CORONARY STENT PLACEMENT     unknown location per pt  . FOOT SURGERY    . LEG SURGERY    . PACEMAKER PLACEMENT    . TONSILLECTOMY      Prior to Admission medications   Medication Sig Start Date End Date Taking? Authorizing Provider   acetaminophen (TYLENOL) 325 MG tablet Take 2 tablets (650 mg total) by mouth every 6 (six) hours as needed for mild pain (or Fever >/= 101). 04/18/17   Nicholes Mango, MD  aspirin EC 81 MG tablet Take 81 mg by mouth daily.    [provider]  baclofen (LIORESAL) 10 MG tablet Take 10 mg by mouth 3 (three) times daily.    [provider]  carvedilol (COREG) 6.25 MG tablet Take 6.25 mg by mouth 2 (two) times daily with a meal.     [provider]  cholecalciferol (VITAMIN D) 1000 units tablet Take 1,000 Units by mouth daily.    [provider]  docusate sodium (COLACE) 100 MG capsule Take 100 mg by mouth 2 (two) times daily.     [provider]  escitalopram (LEXAPRO) 10 MG tablet Take 10 mg by mouth daily.    [provider]  ferrous sulfate 325 (65 FE) MG tablet Take 325 mg by mouth daily with breakfast.    [provider]  furosemide (LASIX) 20 MG tablet Take 1 tablet (20 mg total) by mouth daily. Pt alternates with the 80mg  tablet. Patient taking differently: Take 80 mg by mouth daily.  04/18/17   Nicholes Mango, MD  gabapentin (NEURONTIN) 100 MG capsule Take 100-200 mg by mouth at bedtime.    [provider]  glimepiride (AMARYL) 4 MG tablet Take 4 mg by mouth 2 (two) times daily. 07/01/17  [provider]  GLUCERNA (GLUCERNA) LIQD Take 237 mLs by mouth 2 (two) times daily between meals. Patient not taking: Reported on 07/15/2017 04/18/17   Nicholes Mango, MD  lactobacillus (FLORANEX/LACTINEX) PACK Take 1 packet (1 g total) by mouth 3 (three) times daily with meals. 07/19/17   Nicholes Mango, MD  loperamide (IMODIUM) 2 MG capsule Take 1 capsule (2 mg total) by mouth every 8 (eight) hours as needed for diarrhea or loose stools. 07/19/17   Nicholes Mango, MD  magnesium oxide (MAG-OX) 400 MG tablet Take 400 mg by mouth daily.    [provider]  Melatonin 1 MG TABS Take 1 mg by mouth at bedtime.    [provider]   Multiple Vitamin (MULTIVITAMIN) tablet Take 1 tablet by mouth daily.    [provider]  oxyCODONE (OXY IR/ROXICODONE) 5 MG immediate release tablet Take 1 tablet (5 mg total) by mouth every 6 (six) hours as needed for moderate pain. 04/18/17   Gouru, Illene Silver, MD  pantoprazole (PROTONIX) 40 MG tablet Take 40 mg by mouth daily.    [provider]  potassium chloride SA (K-DUR,KLOR-CON) 20 MEQ tablet Take 20 mEq by mouth 2 (two) times daily. 06/25/17   [provider]  PROAIR HFA 108 [phone] Base) MCG/ACT inhaler  10/02/17   [provider]  Rivaroxaban (XARELTO) 15 MG TABS tablet Take 15 mg by mouth daily.    [provider]  rOPINIRole (REQUIP) 0.5 MG tablet Take 0.5 mg by mouth 2 (two) times daily.    [provider]  sitaGLIPtin (JANUVIA) 100 MG tablet Take 100 mg by mouth daily. 12/06/16   [provider]  spironolactone (ALDACTONE) 25 MG tablet Take 25 mg by mouth daily.     [provider]  vitamin B-12 (CYANOCOBALAMIN) 1000 MCG tablet Take 1,000 mcg by mouth daily.    [provider]  vitamin C (ASCORBIC ACID) 500 MG tablet Take 500 mg by mouth daily.    [provider]  Vitamin D, Ergocalciferol, (DRISDOL) 50000 UNITS CAPS capsule Take 50,000 Units by mouth every 7 (seven) days. Pt takes on Thursday.    [provider]    Allergies  Allergen Reactions  . Ceftriaxone Swelling  . Cefuroxime Hives  . Cephalosporins Swelling and Other (See Comments)    Reaction:  Fainting/dry mouth   . Codeine Nausea And Vomiting  . Epinephrine Other (See Comments)    Reaction:  Fainting   . Morphine And Related Other (See Comments)    Reaction:  GI upset   . Buprenorphine Hcl Nausea And Vomiting  . Ciprofloxacin Itching, Swelling and Rash  . Latex Rash  . Lidocaine Rash  . Procaine Rash    Family History  Problem Relation Age of Onset  . CVA Mother        Congestive Heart Failure, heart attack,  hypertension, stroke  . Diabetes Mellitus II Sister   . Heart disease Father        Congestive Heart Failure, heart attack, hypertension    Social History Social History   Tobacco Use  . Smoking status: Never Smoker  . Smokeless tobacco: Never Used  Substance Use Topics  . Alcohol use: No    Alcohol/week: 0.0 oz  . Drug use: No    Review of Systems Constitutional: Negative for fever. Eyes: Negative for visual complaints ENT: Negative for recent illness/congestion Cardiovascular: Negative for chest pain. Respiratory: Negative for shortness of breath. Gastrointestinal: Negative for abdominal pain, vomiting and  diarrhea. Genitourinary: States urinary frequency but denies dysuria. Musculoskeletal: Negative for musculoskeletal complaints Skin: Negative for skin complaints  Neurological: Negative for headache All other ROS negative  ____________________________________________   PHYSICAL EXAM:  VITAL SIGNS: ED Triage Vitals  Enc Vitals Group     BP 12/11/17 1849 (!) 158/57     Pulse Rate 12/11/17 1849 71     Resp 12/11/17 1849 20     Temp 12/11/17 1849 99.4 F (37.4 C)     Temp src --      SpO2 12/11/17 1849 96 %     Weight 12/11/17 1859 175 lb (79.4 kg)     Height 12/11/17 1859 5\' 7"  (1.702 m)     Head Circumference --      Peak Flow --      Pain Score 12/11/17 1859 8     Pain Loc --      Pain Edu? --      Excl. in [place]? --    Constitutional: Somewhat somnolent and fatigued appearing but able to answer all questions appropriately and follow all commands.  Alert and oriented x4. Eyes: Normal exam ENT   Head: Normocephalic and atraumatic.   Mouth/Throat: Mucous membranes are moist. Cardiovascular: Normal rate, regular rhythm. No murmur Respiratory: Normal respiratory effort without tachypnea nor retractions. Breath sounds are clear  Gastrointestinal: Soft and nontender. No distention.  Musculoskeletal: Nontender with normal range of motion in all  extremities.  Neurologic:  Normal speech and language. No gross focal neurologic deficits  Skin:  Skin is warm, dry and intact.  Psychiatric: Mood and affect are normal.   ____________________________________________     RADIOLOGY  Chest x-ray appears to show cardiomegaly with mild edema.  Left mid lung suspicious for pneumonia.  ____________________________________________   INITIAL IMPRESSION / ASSESSMENT AND PLAN / ED COURSE  Pertinent labs & imaging results that were available during my care of the patient were reviewed by me and considered in my medical decision making (see chart for details).  Patient presents to the emergency department with complaints of generalized fatigue and weakness.  Patient is satting 91% on room air.  Differential is quite broad would include pneumonia, urinary tract infection, infectious etiology, metabolic or electrolyte abnormality, dehydration, DKA.  Patient's lab work has resulted showing a significant leukocytosis of 19,000.  Has a low-grade temperature 99.4 upon arrival and is tachypneic around 28 breaths/min.  Given these findings I have initiated sepsis protocols.  We will start broad-spectrum antibiotics and blood cultures, lactic acid.  Patient has a normal anion gap, blood glucose of 378 does not appear to be consistent with DKA.  We will slow the patient's IV fluids, given pulmonary edema on x-ray.  Patient appears to have a left midlung pneumonia.  Patient receiving broad-spectrum antibiotics will be admitted to the hospitalist service for further treatment.  Patient and family agreeable to plan of care.  Patient is breathing is more labored.  Fluids have been stopped but given the pulmonary edema we will place patient on BiPAP and admit to the hospitalist service.  CRITICAL CARE Performed by: Harvest Dark   Total critical care time: 30 minutes  Critical care time was exclusive of separately billable procedures and treating other  patients.  Critical care was necessary to treat or prevent imminent or life-threatening deterioration.  Critical care was time spent personally by me on the following activities: development of treatment plan with patient and/or surrogate as well as nursing, discussions with consultants, evaluation of patient's response  to treatment, examination of patient, obtaining history from patient or surrogate, ordering and performing treatments and interventions, ordering and review of laboratory studies, ordering and review of radiographic studies, pulse oximetry and re-evaluation of patient's condition.   ____________________________________________   FINAL CLINICAL IMPRESSION(S) / ED DIAGNOSES  Sepsis Pneumonia Pulmonary edema    Harvest Dark, MD 12/11/17 2009    Harvest Dark, MD 12/11/17 2013

## 2017-12-11 NOTE — ED Notes (Addendum)
Notified MD about pt increased RR and wheezing, MD to reassess pt. Family remains at bedside.

## 2017-12-11 NOTE — H&P (Signed)
[facility] [place] [name]    MR#:  [id number]  DATE OF BIRTH:  January 26, 1925  DATE OF ADMISSION:  12/11/2017  PRIMARY CARE PHYSICIAN: Tracie Harrier, MD   REQUESTING/REFERRING PHYSICIAN: Dr. Kerman Passey  CHIEF COMPLAINT:   Chief Complaint  Patient presents with  . Weakness  . Hyperglycemia    HISTORY OF PRESENT ILLNESS:  [name]  is a 84 y.o. female with a known history of diabetes mellitus, hypertension, COPD, CHF presents to the hospital due to weakness of two days and cough and shortness of breath. Hearing the emergency room patient's chest x-ray shows pneumonia and congestive heart failure. Even on nasal cannula patient to breathing over 30 times a minute and started on BiPAP. Patient is alert and awake. Family at bedside. White count 19,000. Tachycardic.  PAST MEDICAL HISTORY:   Past Medical History:  Diagnosis Date  . Cancer ([place])    skin ca  . CHF (congestive heart failure) ([place])   . Chronic back pain   . COPD (chronic obstructive pulmonary disease) ([place])   . Diabetes mellitus without complication ([place])   . Hypertension   . Poor perfusion of leg     PAST SURGICAL HISTORY:   Past Surgical History:  Procedure Laterality Date  . ABDOMINAL HYSTERECTOMY    . BACK SURGERY     x3  . CORONARY STENT PLACEMENT     unknown location per pt  . FOOT SURGERY    . LEG SURGERY    . PACEMAKER PLACEMENT    . TONSILLECTOMY      SOCIAL HISTORY:   Social History   Tobacco Use  . Smoking status: Never Smoker  . Smokeless tobacco: Never Used  Substance Use Topics  . Alcohol use: No    Alcohol/week: 0.0 oz    FAMILY HISTORY:   Family History  Problem Relation Age of Onset  . CVA Mother        Congestive Heart Failure, heart attack, hypertension, stroke  . Diabetes Mellitus II Sister   . Heart disease Father        Congestive Heart Failure, heart attack, hypertension    DRUG ALLERGIES:   Allergies   Allergen Reactions  . Ceftriaxone Swelling  . Cefuroxime Hives  . Cephalosporins Swelling and Other (See Comments)    Reaction:  Fainting/dry mouth   . Codeine Nausea And Vomiting  . Epinephrine Other (See Comments)    Reaction:  Fainting   . Morphine And Related Other (See Comments)    Reaction:  GI upset   . Buprenorphine Hcl Nausea And Vomiting  . Ciprofloxacin Itching, Swelling and Rash  . Latex Rash  . Lidocaine Rash  . Procaine Rash    REVIEW OF SYSTEMS:   Review of Systems  Constitutional: Positive for malaise/fatigue. Negative for chills and fever.  HENT: Negative for sore throat.   Eyes: Negative for blurred vision, double vision and pain.  Respiratory: Positive for cough and shortness of breath. Negative for hemoptysis and wheezing.   Cardiovascular: Negative for chest pain, palpitations, orthopnea and leg swelling.  Gastrointestinal: Negative for abdominal pain, constipation, diarrhea, heartburn, nausea and vomiting.  Genitourinary: Negative for dysuria and hematuria.  Musculoskeletal: Positive for back pain. Negative for joint pain.  Skin: Negative for rash.  Neurological: Negative for sensory change, speech change, focal weakness and headaches.  Endo/Heme/Allergies: Does not bruise/bleed easily.  Psychiatric/Behavioral: Negative for depression. The patient is not nervous/anxious.     MEDICATIONS  AT HOME:   Prior to Admission medications   Medication Sig Start Date End Date Taking? Authorizing Provider  aspirin EC 81 MG tablet Take 81 mg by mouth daily.   Yes [provider]  baclofen (LIORESAL) 10 MG tablet Take 10 mg by mouth 3 (three) times daily as needed.    Yes [provider]  buPROPion (WELLBUTRIN XL) 150 MG 24 hr tablet Take 1 tablet by mouth daily. 11/28/17  Yes [provider]  carvedilol (COREG) 6.25 MG tablet Take 6.25 mg by mouth 2 (two) times daily with a meal.    Yes [provider]  cholecalciferol (VITAMIN D)  1000 units tablet Take 1,000 Units by mouth daily.   Yes [provider]  docusate sodium (COLACE) 100 MG capsule Take 100 mg by mouth 2 (two) times daily.    Yes [provider]  escitalopram (LEXAPRO) 10 MG tablet Take 10 mg by mouth daily.   Yes [provider]  ferrous sulfate 325 (65 FE) MG tablet Take 325 mg by mouth daily with breakfast.   Yes [provider]  furosemide (LASIX) 20 MG tablet Take 1 tablet (20 mg total) by mouth daily. Pt alternates with the 80mg  tablet. Patient taking differently: Take 80 mg by mouth daily.  04/18/17  Yes Gouru, Illene Silver, MD  gabapentin (NEURONTIN) 100 MG capsule Take 100-200 mg by mouth at bedtime.   Yes [provider]  glimepiride (AMARYL) 4 MG tablet Take 4 mg by mouth 2 (two) times daily. 07/01/17  Yes [provider]  magnesium oxide (MAG-OX) 400 MG tablet Take 400 mg by mouth daily.   Yes [provider]  Melatonin 1 MG TABS Take 1 mg by mouth at bedtime.   Yes [provider]  metFORMIN (GLUCOPHAGE) 500 MG tablet Take 1 tablet by mouth daily. 12/10/17  Yes [provider]  Multiple Vitamin (MULTIVITAMIN) tablet Take 1 tablet by mouth daily.   Yes [provider]  pantoprazole (PROTONIX) 40 MG tablet Take 40 mg by mouth daily.   Yes [provider]  potassium chloride SA (K-DUR,KLOR-CON) 20 MEQ tablet Take 20 mEq by mouth 2 (two) times daily. 06/25/17  Yes [provider]  PROAIR HFA 108 (90 Base) MCG/ACT inhaler Inhale 2 puffs into the lungs every 6 (six) hours as needed for wheezing or shortness of breath.  10/02/17  Yes [provider]  Rivaroxaban (XARELTO) 15 MG TABS tablet Take 15 mg by mouth daily.   Yes [provider]  rOPINIRole (REQUIP) 0.5 MG tablet Take 0.5 mg by mouth 2 (two) times daily.   Yes [provider]  sitaGLIPtin (JANUVIA) 100 MG tablet Take 100 mg by mouth daily. 12/06/16  Yes [provider]   spironolactone (ALDACTONE) 25 MG tablet Take 25 mg by mouth daily.    Yes [provider]  vitamin B-12 (CYANOCOBALAMIN) 1000 MCG tablet Take 1,000 mcg by mouth daily.   Yes [provider]  vitamin C (ASCORBIC ACID) 500 MG tablet Take 500 mg by mouth daily.   Yes [provider]  Vitamin D, Ergocalciferol, (DRISDOL) 50000 UNITS CAPS capsule Take 50,000 Units by mouth every 7 (seven) days. Pt takes on Thursday.   Yes [provider]  acetaminophen (TYLENOL) 325 MG tablet Take 2 tablets (650 mg total) by mouth every 6 (six) hours as needed for mild pain (or Fever >/= 101). 04/18/17   Gouru, Illene Silver, MD  GLUCERNA (GLUCERNA) LIQD Take 237 mLs by mouth  2 (two) times daily between meals. Patient not taking: Reported on 07/15/2017 04/18/17   Nicholes Mango, MD  lactobacillus (FLORANEX/LACTINEX) PACK Take 1 packet (1 g total) by mouth 3 (three) times daily with meals. Patient not taking: Reported on 12/11/2017 07/19/17   Nicholes Mango, MD  loperamide (IMODIUM) 2 MG capsule Take 1 capsule (2 mg total) by mouth every 8 (eight) hours as needed for diarrhea or loose stools. Patient not taking: Reported on 12/11/2017 07/19/17   Nicholes Mango, MD  oxyCODONE (OXY IR/ROXICODONE) 5 MG immediate release tablet Take 1 tablet (5 mg total) by mouth every 6 (six) hours as needed for moderate pain. Patient not taking: Reported on 12/11/2017 04/18/17   Nicholes Mango, MD     VITAL SIGNS:  Blood pressure (!) 160/56, pulse 77, temperature 99.4 F (37.4 C), resp. rate (!) 37, height 5\' 7"  (1.702 m), weight 79.4 kg (175 lb), SpO2 95 %.  PHYSICAL EXAMINATION:  Physical Exam  GENERAL:  84 y.o.-year-old patient lying in the bed , looks critically ill. In respiratory distress. On BiPAP. EYES: Pupils equal, round, reactive to light and accommodation. No scleral icterus. Extraocular muscles intact.  HEENT: Head atraumatic, normocephalic. Oropharynx and nasopharynx clear. No oropharyngeal erythema,  moist oral mucosa  NECK:  Supple, no jugular venous distention. No thyroid enlargement, no tenderness.  LUNGS: decreased data entry and by basilar crackles CARDIOVASCULAR: S1, S2 normal. No murmurs, rubs, or gallops.  ABDOMEN: Soft, nontender, nondistended. Bowel sounds present. No organomegaly or mass.  EXTREMITIES: No pedal edema, cyanosis, or clubbing. + 2 pedal & radial pulses b/l.   NEUROLOGIC: moves all four extremities PSYCHIATRIC: The patient is alert and oriented x 3. Anxious SKIN: No obvious rash, lesion, or ulcer.   LABORATORY PANEL:   CBC Recent Labs  Lab 12/11/17 1907  WBC 19.4*  HGB 11.7*  HCT 35.7  PLT 117*   ------------------------------------------------------------------------------------------------------------------  Chemistries  Recent Labs  Lab 12/11/17 1907 12/11/17 1935  NA 136  --   K 4.5  --   CL 102  --   CO2 24  --   GLUCOSE 370*  --   BUN 39*  --   CREATININE 1.13*  --   CALCIUM 9.2  --   AST  --  21  ALT  --  13*  ALKPHOS  --  64  BILITOT  --  0.9   ------------------------------------------------------------------------------------------------------------------  Cardiac Enzymes No results for input(s): TROPONINI in the last 168 hours. ------------------------------------------------------------------------------------------------------------------  RADIOLOGY:  Dg Chest 2 View  Result Date: 12/11/2017 CLINICAL DATA:  Weakness, SHOB, hx of CHF, COPD, HTN, non smoker EXAM: CHEST - 2 VIEW COMPARISON:  11/28/2017 FINDINGS: Patient's LEFT-sided transvenous pacemaker leads to the RIGHT atrium and RIGHT ventricle. There are patchy airspace filling opacities throughout the lungs, more confluent in the LEFT mid lung zone. Marked kyphosis. Remote posterior lumbar fusion. IMPRESSION: 1. Cardiomegaly and mild edema. 2. Focal airspace filling in the LEFT mid lung zone suspicious for infectious infiltrate. Follow-up is recommended. Electronically  Signed   By: Nolon Nations M.D.   On: 12/11/2017 19:58     IMPRESSION AND PLAN:   *Acute hypoxic respiratory failure secondary to pneumonia and congestive heart failure BiPAP support. Critically ill. Admit to step down unit.  *Bilateral pneumonia. Patient will be started on broad-spectrum antibiotics due to sepsis. IV vancomycin and Aztreonam. Has multiple allergies. Blood cultures sent and pending lactic acid pending. Meet sepsis criteria. No IV fluids due to acute CHF.  *Acute on chronic  diastolic congestive heart failure. It will give static dose of 60 mg Lasix. Schedule Lasix from tomorrow morning.  *Diabetes mellitus. Patient is NPO. Accu checks Q4 hours. Sliding scale insulin.  *Anxiety. Will give one dose of Ativan.  *Chronic back pain. Continue oxycodone patient uses at home.  All the records are reviewed and case discussed with ED provider. Management plans discussed with the patient, family and they are in agreement.  Discussed with patient and presence of daughter and husband at bedside. Patient is do not resuscitate and cannot intubate.   CODE STATUS: DNR  TOTAL TIME TAKING CARE OF THIS PATIENT: 80 minutes.   [name] M.D on 12/11/2017 at 9:01 PM  Between 7am to 6pm - Pager - [phone]  After 6pm go to [email or web address] - password [email or web address] [place]  Office  [phone]  CC: Primary care physician; Tracie Harrier, MD  Note: This dictation was prepared with Dragon dictation along with smaller phrase technology. Any transcriptional errors that result from this process are unintentional.

## 2017-12-11 NOTE — ED Triage Notes (Signed)
[facility] called for weakness that started today, BSL 531 at home. Type 2DM and on pills, no insulin. A &O x4, +generalized malaise. Denies any pain or N/V/D

## 2017-12-11 NOTE — Progress Notes (Signed)
Pharmacy Antibiotic Note  [name] is a 84 y.o. female admitted on 12/11/2017 with sepsis.  Pharmacy has been consulted for vancomycin dosing.  Plan: Vancomycin 1000mg  IV every 24 hours.  Goal trough 15-20 mcg/mL.  Height: 5\' 7"  (170.2 cm) Weight: 175 lb (79.4 kg) IBW/kg (Calculated) : 61.6  Temp (24hrs), Avg:99.4 F (37.4 C), Min:99.4 F (37.4 C), Max:99.4 F (37.4 C)  Recent Labs  Lab 12/11/17 1907  WBC 19.4*  CREATININE 1.13*    Estimated Creatinine Clearance: 35.9 mL/min (A) (by C-G formula based on SCr of 1.13 mg/dL (H)).    Allergies  Allergen Reactions  . Ceftriaxone Swelling  . Cefuroxime Hives  . Cephalosporins Swelling and Other (See Comments)    Reaction:  Fainting/dry mouth   . Codeine Nausea And Vomiting  . Epinephrine Other (See Comments)    Reaction:  Fainting   . Morphine And Related Other (See Comments)    Reaction:  GI upset   . Buprenorphine Hcl Nausea And Vomiting  . Ciprofloxacin Itching, Swelling and Rash  . Latex Rash  . Lidocaine Rash  . Procaine Rash    Antimicrobials this admission: Anti-infectives (From admission, onward)   Start     Dose/Rate Route Frequency Ordered Stop   12/12/17 0200  vancomycin (VANCOCIN) IVPB 1000 mg/200 mL premix     1,000 mg 200 mL/hr over 60 Minutes Intravenous Every 24 hours 12/11/17 2029     12/11/17 2200  aztreonam (AZACTAM) injection 1 g  Status:  Discontinued     1 g Intramuscular Every 8 hours 12/11/17 1935 12/11/17 1937   12/11/17 1945  vancomycin (VANCOCIN) IVPB 1000 mg/200 mL premix     1,000 mg 200 mL/hr over 60 Minutes Intravenous  Once 12/11/17 1935     12/11/17 1945  aztreonam (AZACTAM) 1 g in sodium chloride 0.9 % 100 mL IVPB     1 g 200 mL/hr over 30 Minutes Intravenous Every 8 hours 12/11/17 1938         Microbiology results: No results found for this or any previous visit (from the past 240 hour(s)).   Thank you for allowing pharmacy to be a part of this patient's  care.  Donna Christen Safire Gordin 12/11/2017 8:29 PM

## 2017-12-11 NOTE — ED Notes (Signed)
Report called to Dell RN in CCU and pt/family updated with plan of care.

## 2017-12-11 NOTE — Progress Notes (Signed)
Pt transported to [facility] without any incident , bipap is plugged into red outlet , Report given to RT InCare of the unit , no sings of resp distress noted

## 2017-12-11 NOTE — ED Notes (Signed)
RT at bedside to place pt on Bipap. Pt and family updated. Pt alert but tired. Call bell within reach, will continue to monitor.

## 2017-12-11 NOTE — Consult Note (Signed)
PULMONARY / CRITICAL CARE MEDICINE   [name] MRN: [id number] DOB: December 28, 1924    ADMISSION DATE:  12/11/2017   CONSULTATION DATE:  12/11/2017  REFERRING MD:   Dr Darvin Neighbours  REASON: Acute hypoxic respiratory failure requiring BiPAP  HISTORY OF PRESENT ILLNESS:   This is a 84 year old female who presented to the ED with hypoglycemia, cough, shortness of breath and weakness x2 days.  History is obtained from ED records as patient is on continuous BiPAP and unable to provide a history.  Per ED records, patient's blood sugar was 531 mg/dL at home.  Patient was weak and short of breath hence family called EMS.  At the ED, her chest x-ray showed left lung pneumonia, WBC was 19.4k, and her blood sugar was 370 mg/dL.  She is being admitted to the ICU for further management.   PAST MEDICAL HISTORY :  She  has a past medical history of Cancer ([place]), CHF (congestive heart failure) ([place]), Chronic back pain, COPD (chronic obstructive pulmonary disease) ([place]), Diabetes mellitus without complication ([place]), Hypertension, and Poor perfusion of leg.  PAST SURGICAL HISTORY: She  has a past surgical history that includes pacemaker placement; Leg Surgery; Abdominal hysterectomy; Tonsillectomy; Back surgery; Coronary stent placement; and Foot surgery.  Allergies  Allergen Reactions  . Ceftriaxone Swelling  . Cefuroxime Hives  . Cephalosporins Swelling and Other (See Comments)    Reaction:  Fainting/dry mouth   . Codeine Nausea And Vomiting  . Epinephrine Other (See Comments)    Reaction:  Fainting   . Morphine And Related Other (See Comments)    Reaction:  GI upset   . Buprenorphine Hcl Nausea And Vomiting  . Ciprofloxacin Itching, Swelling and Rash  . Latex Rash  . Lidocaine Rash  . Procaine Rash    No current facility-administered medications on file prior to encounter.    Current Outpatient Medications on File Prior to Encounter  Medication Sig  . aspirin EC 81 MG tablet Take 81  mg by mouth daily.  . baclofen (LIORESAL) 10 MG tablet Take 10 mg by mouth 3 (three) times daily as needed.   Marland Kitchen buPROPion (WELLBUTRIN XL) 150 MG 24 hr tablet Take 1 tablet by mouth daily.  . carvedilol (COREG) 6.25 MG tablet Take 6.25 mg by mouth 2 (two) times daily with a meal.   . cholecalciferol (VITAMIN D) 1000 units tablet Take 1,000 Units by mouth daily.  Marland Kitchen docusate sodium (COLACE) 100 MG capsule Take 100 mg by mouth 2 (two) times daily.   Marland Kitchen escitalopram (LEXAPRO) 10 MG tablet Take 10 mg by mouth daily.  . ferrous sulfate 325 (65 FE) MG tablet Take 325 mg by mouth daily with breakfast.  . furosemide (LASIX) 20 MG tablet Take 1 tablet (20 mg total) by mouth daily. Pt alternates with the 80mg  tablet. (Patient taking differently: Take 80 mg by mouth daily. )  . gabapentin (NEURONTIN) 100 MG capsule Take 100-200 mg by mouth at bedtime.  Marland Kitchen glimepiride (AMARYL) 4 MG tablet Take 4 mg by mouth 2 (two) times daily.  . magnesium oxide (MAG-OX) 400 MG tablet Take 400 mg by mouth daily.  . Melatonin 1 MG TABS Take 1 mg by mouth at bedtime.  . metFORMIN (GLUCOPHAGE) 500 MG tablet Take 1 tablet by mouth daily.  . Multiple Vitamin (MULTIVITAMIN) tablet Take 1 tablet by mouth daily.  . pantoprazole (PROTONIX) 40 MG tablet Take 40 mg by mouth daily.  . potassium chloride SA (K-DUR,KLOR-CON) 20 MEQ tablet Take 20  mEq by mouth 2 (two) times daily.  Marland Kitchen PROAIR HFA 108 (90 Base) MCG/ACT inhaler Inhale 2 puffs into the lungs every 6 (six) hours as needed for wheezing or shortness of breath.   . Rivaroxaban (XARELTO) 15 MG TABS tablet Take 15 mg by mouth daily.  Marland Kitchen rOPINIRole (REQUIP) 0.5 MG tablet Take 0.5 mg by mouth 2 (two) times daily.  . sitaGLIPtin (JANUVIA) 100 MG tablet Take 100 mg by mouth daily.  Marland Kitchen spironolactone (ALDACTONE) 25 MG tablet Take 25 mg by mouth daily.   . vitamin B-12 (CYANOCOBALAMIN) 1000 MCG tablet Take 1,000 mcg by mouth daily.  . vitamin C (ASCORBIC ACID) 500 MG tablet Take 500 mg by  mouth daily.  . Vitamin D, Ergocalciferol, (DRISDOL) 50000 UNITS CAPS capsule Take 50,000 Units by mouth every 7 (seven) days. Pt takes on Thursday.  Marland Kitchen acetaminophen (TYLENOL) 325 MG tablet Take 2 tablets (650 mg total) by mouth every 6 (six) hours as needed for mild pain (or Fever >/= 101).  Marland Kitchen GLUCERNA (GLUCERNA) LIQD Take 237 mLs by mouth 2 (two) times daily between meals. (Patient not taking: Reported on 07/15/2017)  . lactobacillus (FLORANEX/LACTINEX) PACK Take 1 packet (1 g total) by mouth 3 (three) times daily with meals. (Patient not taking: Reported on 12/11/2017)  . loperamide (IMODIUM) 2 MG capsule Take 1 capsule (2 mg total) by mouth every 8 (eight) hours as needed for diarrhea or loose stools. (Patient not taking: Reported on 12/11/2017)  . oxyCODONE (OXY IR/ROXICODONE) 5 MG immediate release tablet Take 1 tablet (5 mg total) by mouth every 6 (six) hours as needed for moderate pain. (Patient not taking: Reported on 12/11/2017)    FAMILY HISTORY:  Her indicated that her mother is deceased. She indicated that her father is deceased. She indicated that the status of her sister is unknown.   SOCIAL HISTORY: She  reports that she has never smoked. She has never used smokeless tobacco. She reports that she does not drink alcohol or use drugs.  REVIEW OF SYSTEMS:   Unable to obtain as patient is on continuous BiPAP  SUBJECTIVE:   VITAL SIGNS: BP 110/81   Pulse 73   Temp (!) 102.4 F (39.1 C) (Axillary)   Resp 17   Ht 5\' 7"  (1.702 m)   Wt 175 lb (79.4 kg)   SpO2 (!) 80%   BMI 27.41 kg/m   HEMODYNAMICS:    VENTILATOR SETTINGS:    INTAKE / OUTPUT: No intake/output data recorded.  PHYSICAL EXAMINATION: General: Acutely ill looking, in moderate respiratory distress Neuro: Alert and oriented x2, moves all extremities, follows some basic commands HEENT: PERRLA, no JVD, trachea midline Cardiovascular: Apical pulse regular, S1-S2, no murmur regurg or gallop, +2 pulses  bilaterally, trace edema Lungs: Increased work of breathing, bilateral breath sounds, diminished in the bases, diffuse bibasilar rhonchi Abdomen: Nontender,  normal bowel sounds in all 4 quadrants, mildly distended Musculoskeletal: Positive range of motion in upper and lower extremities Skin: Warm and dry, left foot with dressing intact  LABS:  BMET Recent Labs  Lab 12/11/17 1907  NA 136  K 4.5  CL 102  CO2 24  BUN 39*  CREATININE 1.13*  GLUCOSE 370*    Electrolytes Recent Labs  Lab 12/11/17 1907  CALCIUM 9.2    CBC Recent Labs  Lab 12/11/17 1907  WBC 19.4*  HGB 11.7*  HCT 35.7  PLT 117*    Coag's No results for input(s): APTT, INR in the last 168 hours.  Sepsis  Markers Recent Labs  Lab 12/11/17 1944  LATICACIDVEN 2.5*    ABG No results for input(s): PHART, PCO2ART, PO2ART in the last 168 hours.  Liver Enzymes Recent Labs  Lab 12/11/17 1935  AST 21  ALT 13*  ALKPHOS 64  BILITOT 0.9  ALBUMIN 3.6    Cardiac Enzymes Recent Labs  Lab 12/11/17 1944  TROPONINI 0.04*    Glucose Recent Labs  Lab 12/11/17 1907 12/11/17 2204  GLUCAP 374* 292*    Imaging Dg Chest 2 View  Result Date: 12/11/2017 CLINICAL DATA:  Weakness, SHOB, hx of CHF, COPD, HTN, non smoker EXAM: CHEST - 2 VIEW COMPARISON:  11/28/2017 FINDINGS: Patient's LEFT-sided transvenous pacemaker leads to the RIGHT atrium and RIGHT ventricle. There are patchy airspace filling opacities throughout the lungs, more confluent in the LEFT mid lung zone. Marked kyphosis. Remote posterior lumbar fusion. IMPRESSION: 1. Cardiomegaly and mild edema. 2. Focal airspace filling in the LEFT mid lung zone suspicious for infectious infiltrate. Follow-up is recommended. Electronically Signed   By: Nolon Nations M.D.   On: 12/11/2017 19:58    STUDIES:  Last 2D echo 07/18/2017 Normal LVF   Normal Wall Motion   EF=65%   Moderate LVH   Dilated RV   Pacer wire in place   Severe TR   Dilated RA/LA.  No cardiac source of emboli was indentified.  CULTURES: Blood cultures x2 Urine culture pending ANTIBIOTICS: Vancomycin Aztreonam  SIGNIFICANT EVENTS: 12/11/2017: Admitted  LINES/TUBES: Peripheral IVs  DISCUSSION: 84 year old female presenting with hypoglycemia, pneumonia and sepsis secondary to pneumonia  ASSESSMENT Sepsis secondary to pneumonia Acute hypoxic respiratory failure secondary to pneumonia requiring BiPAP Multifocal pneumonia Type 2 diabetes with hyperglycemia CKD stage II Acute on chronic COPD exacerbation Acute on chronic diastolic heart failure History of hypertension  PLAN Hemodynamic monitoring per ICU protocol Continuous BiPAP and titrate to nasal cannula as tolerated Antibiotics as above Follow-up cultures Blood glucose monitoring with sliding scale insulin coverage Discontinue oral hypoglycemic agents Nebulized bronchodilators Hold Lasix in light of decreasing blood pressure and sepsis Gentle IV hydration with normal saline at 50 mL's per hour PRN Tylenol for fever PRN morphine for severe dyspnea GI and DVT prophylaxis Patient is a DNR/DNI  FAMILY  - Updates: No family at bedside.  Will update when available  Yvan [facility] ANP-BC Pulmonary and Critical Care Medicine [facility] Pager [phone] or [phone]  NB: This document was prepared using Dragon voice recognition software and may include unintentional dictation errors.   12/11/2017, 11:16 PM

## 2017-12-11 NOTE — ED Notes (Signed)
[place] external cath applied to pt. Pt and family updated with plan of care. VSS. Pt denies pain currently. Call bell within reach. WCTM.

## 2017-12-12 ENCOUNTER — Inpatient Hospital Stay: Payer: Medicare Other

## 2017-12-12 DIAGNOSIS — A4151 Sepsis due to Escherichia coli [E. coli]: Principal | ICD-10-CM

## 2017-12-12 DIAGNOSIS — J189 Pneumonia, unspecified organism: Secondary | ICD-10-CM

## 2017-12-12 DIAGNOSIS — N179 Acute kidney failure, unspecified: Secondary | ICD-10-CM

## 2017-12-12 LAB — CBC
HCT: 34.7 % — ABNORMAL LOW (ref 35.0–47.0)
HEMOGLOBIN: 11.3 g/dL — AB (ref 12.0–16.0)
MCH: 29 pg (ref 26.0–34.0)
MCHC: 32.4 g/dL (ref 32.0–36.0)
MCV: 89.4 fL (ref 80.0–100.0)
PLATELETS: 112 10*3/uL — AB (ref 150–440)
RBC: 3.88 MIL/uL (ref 3.80–5.20)
RDW: 17.7 % — ABNORMAL HIGH (ref 11.5–14.5)
WBC: 20.9 10*3/uL — ABNORMAL HIGH (ref 3.6–11.0)

## 2017-12-12 LAB — BLOOD CULTURE ID PANEL (REFLEXED)
Acinetobacter baumannii: NOT DETECTED
CANDIDA KRUSEI: NOT DETECTED
CANDIDA PARAPSILOSIS: NOT DETECTED
CANDIDA TROPICALIS: NOT DETECTED
CARBAPENEM RESISTANCE: NOT DETECTED
Candida albicans: NOT DETECTED
Candida glabrata: NOT DETECTED
ENTEROCOCCUS SPECIES: NOT DETECTED
Enterobacter cloacae complex: NOT DETECTED
Enterobacteriaceae species: DETECTED — AB
Escherichia coli: DETECTED — AB
Haemophilus influenzae: NOT DETECTED
KLEBSIELLA OXYTOCA: NOT DETECTED
KLEBSIELLA PNEUMONIAE: NOT DETECTED
LISTERIA MONOCYTOGENES: NOT DETECTED
Neisseria meningitidis: NOT DETECTED
PROTEUS SPECIES: NOT DETECTED
Pseudomonas aeruginosa: NOT DETECTED
SERRATIA MARCESCENS: NOT DETECTED
STAPHYLOCOCCUS AUREUS BCID: NOT DETECTED
STAPHYLOCOCCUS SPECIES: NOT DETECTED
Streptococcus agalactiae: NOT DETECTED
Streptococcus pneumoniae: NOT DETECTED
Streptococcus pyogenes: NOT DETECTED
Streptococcus species: NOT DETECTED

## 2017-12-12 LAB — BASIC METABOLIC PANEL
Anion gap: 8 (ref 5–15)
BUN: 33 mg/dL — ABNORMAL HIGH (ref 6–20)
CALCIUM: 9 mg/dL (ref 8.9–10.3)
CO2: 27 mmol/L (ref 22–32)
CREATININE: 1.13 mg/dL — AB (ref 0.44–1.00)
Chloride: 103 mmol/L (ref 101–111)
GFR calc Af Amer: 48 mL/min — ABNORMAL LOW (ref >60)
GFR calc non Af Amer: 41 mL/min — ABNORMAL LOW (ref >60)
GLUCOSE: 130 mg/dL — AB (ref 65–99)
Potassium: 4.1 mmol/L (ref 3.5–5.1)
Sodium: 138 mmol/L (ref 135–145)

## 2017-12-12 LAB — MAGNESIUM: MAGNESIUM: 2.3 mg/dL (ref 1.7–2.4)

## 2017-12-12 LAB — LACTIC ACID, PLASMA: Lactic Acid, Venous: 1.8 mmol/L (ref 0.5–1.9)

## 2017-12-12 LAB — GLUCOSE, CAPILLARY
GLUCOSE-CAPILLARY: 123 mg/dL — AB (ref 65–99)
GLUCOSE-CAPILLARY: 147 mg/dL — AB (ref 65–99)
GLUCOSE-CAPILLARY: 157 mg/dL — AB (ref 65–99)
GLUCOSE-CAPILLARY: 174 mg/dL — AB (ref 65–99)
GLUCOSE-CAPILLARY: 200 mg/dL — AB (ref 65–99)
Glucose-Capillary: 128 mg/dL — ABNORMAL HIGH (ref 65–99)

## 2017-12-12 LAB — MRSA PCR SCREENING: MRSA BY PCR: NEGATIVE

## 2017-12-12 LAB — HEMOGLOBIN A1C
HEMOGLOBIN A1C: 9.6 % — AB (ref 4.8–5.6)
Mean Plasma Glucose: 228.82 mg/dL

## 2017-12-12 LAB — PHOSPHORUS: Phosphorus: 2.8 mg/dL (ref 2.5–4.6)

## 2017-12-12 MED ORDER — VANCOMYCIN HCL IN DEXTROSE 1-5 GM/200ML-% IV SOLN
1000.0000 mg | INTRAVENOUS | Status: DC
Start: 2017-12-12 — End: 2017-12-12
  Filled 2017-12-12: qty 200

## 2017-12-12 MED ORDER — BISACODYL 10 MG RE SUPP: 10.0000 mg | Freq: Once | RECTAL | Status: DC

## 2017-12-12 MED ORDER — IPRATROPIUM-ALBUTEROL 0.5-2.5 (3) MG/3ML IN SOLN
3.0000 mL | Freq: Four times a day (QID) | RESPIRATORY_TRACT | Status: DC
  Administered 2017-12-12 – 2017-12-13 (×7): 3 mL via RESPIRATORY_TRACT
  Filled 2017-12-12 (×8): qty 3

## 2017-12-12 MED ORDER — GABAPENTIN 100 MG PO CAPS
100.0000 mg | ORAL_CAPSULE | Freq: Every day | ORAL | Status: DC
  Administered 2017-12-12 – 2017-12-14 (×3): 100 mg via ORAL
  Filled 2017-12-12 (×3): qty 1

## 2017-12-12 MED ORDER — ORAL CARE MOUTH RINSE
15.0000 mL | Freq: Two times a day (BID) | OROMUCOSAL | Status: DC
  Administered 2017-12-14 – 2017-12-15 (×2): 15 mL via OROMUCOSAL

## 2017-12-12 MED ORDER — SODIUM CHLORIDE 0.9 % IV SOLN
INTRAVENOUS | Status: DC
  Administered 2017-12-12: 04:00:00 via INTRAVENOUS

## 2017-12-12 MED ORDER — KETOROLAC TROMETHAMINE 15 MG/ML IJ SOLN
15.0000 mg | Freq: Four times a day (QID) | INTRAMUSCULAR | Status: DC | PRN
  Administered 2017-12-12: 15 mg via INTRAVENOUS
  Filled 2017-12-12 (×2): qty 1

## 2017-12-12 MED ORDER — ENSURE ENLIVE PO LIQD
237.0000 mL | Freq: Two times a day (BID) | ORAL | Status: DC
  Administered 2017-12-12 – 2017-12-15 (×5): 237 mL via ORAL

## 2017-12-12 MED ORDER — SODIUM CHLORIDE 0.9 % IV SOLN
1.0000 g | Freq: Two times a day (BID) | INTRAVENOUS | Status: DC
  Administered 2017-12-12 – 2017-12-14 (×5): 1 g via INTRAVENOUS
  Filled 2017-12-12 (×6): qty 1

## 2017-12-12 MED ORDER — CHLORHEXIDINE GLUCONATE 0.12 % MT SOLN
15.0000 mL | Freq: Two times a day (BID) | OROMUCOSAL | Status: DC
  Administered 2017-12-12 – 2017-12-15 (×4): 15 mL via OROMUCOSAL
  Filled 2017-12-12 (×3): qty 15

## 2017-12-12 MED ORDER — MORPHINE SULFATE (PF) 2 MG/ML IV SOLN: 1.0000 mg | INTRAVENOUS | Status: DC | PRN

## 2017-12-12 NOTE — Progress Notes (Signed)
Initial Nutrition Assessment  DOCUMENTATION CODES:   Not applicable  INTERVENTION:   Ensure Enlive po BID, each supplement provides 350 kcal and 20 grams of protein  Liberal diet   Recommend MVI daily if poor oral intake   NUTRITION DIAGNOSIS:   Inadequate oral intake related to acute illness as evidenced by other (comment)(pt just initiated on diet today).  GOAL:   Patient will meet greater than or equal to 90% of their needs  MONITOR:   PO intake, Supplement acceptance, Labs, Weight trends, I & O's, Skin  REASON FOR ASSESSMENT:   Malnutrition Screening Tool    ASSESSMENT:   84 year old female with past medical history significant for chronic respiratory failure secondary to COPD on 2 L home oxygen, CHF, chronic back pain, atrial fibrillation on Xarelto status post pacemaker, diabetes, PAD and hypertension brought to the hospital secondary to fevers and admitted for sepsis  Met with pt in room today. Pt reports that she is normally a good eater at baseline. Pt does not drink any supplements at home but she drank an Ensure this morning and is willing to keep drinking them. Per chart, pt is weight stable. Pt NPO since admit; will initiate regular diet today and order Ensure. RD will continue to monitor BGs. If blood glucoses become elevated and pt is eating well; can change to carbohydrate controlled diet and Glucerna.    Medications reviewed and include: aspirin, dulcolax, colace, lasix, insulin, Mg oxide, protonox, melatonin, KCl, requip, aldactone, NaCl '@50ml'$ /hr, meropenem, vancomycin    Labs reviewed: BUN 33(H), creat 1.13(H) Wbc- 20.9(H) cbgs- 128, 157 x 24 hrs AIC 9.6(H)- 6/20  NUTRITION - FOCUSED PHYSICAL EXAM:    Most Recent Value  Orbital Region  No depletion  Upper Arm Region  No depletion  Thoracic and Lumbar Region  No depletion  Buccal Region  No depletion  Temple Region  Mild depletion  Clavicle Bone Region  No depletion  Clavicle and Acromion Bone  Region  No depletion  Scapular Bone Region  No depletion  Dorsal Hand  No depletion  Patellar Region  No depletion  Anterior Thigh Region  No depletion  Posterior Calf Region  No depletion  Edema (RD Assessment)  None  Hair  Reviewed  Eyes  Reviewed  Mouth  Reviewed  Skin  Reviewed  Nails  Reviewed     Diet Order:   Diet Order           Diet regular Room service appropriate? Yes; Fluid consistency: Thin  Diet effective now         EDUCATION NEEDS:   No education needs have been identified at this time  Skin:  Skin Assessment: Reviewed RN Assessment(ecchymosis )  Last BM:  pta  Height:   Ht Readings from Last 1 Encounters:  12/11/17 '5\' 7"'$  (1.702 m)    Weight:   Wt Readings from Last 1 Encounters:  12/11/17 175 lb (79.4 kg)    Ideal Body Weight:  61.36 kg  BMI:  Body mass index is 27.41 kg/m.  Estimated Nutritional Needs:   Kcal:  1400-1600kcal/day   Protein:  80-88g/day   Fluid:  >1.5L/day   Koleen Distance MS, RD, LDN Pager #- [phone] Office#- [phone] After Hours Pager: [phone]

## 2017-12-12 NOTE — Progress Notes (Signed)
PHARMACY - PHYSICIAN COMMUNICATION CRITICAL VALUE ALERT - BLOOD CULTURE IDENTIFICATION (BCID)  [name] is an 84 y.o. female who presented to [facility] on 12/11/2017 with a chief complaint of Pneumonia   Assessment:  Pneumonia/Bacteremia  Name of physician (or Provider) Contacted: Celesta Aver  Current antibiotics: Aztreonam/Vancomycin  Changes to prescribed antibiotics recommended:  Recommendations accepted by provider  Results for orders placed or performed during the hospital encounter of 12/11/17  Blood Culture ID Panel (Reflexed) (Collected: 12/11/2017  7:32 PM)  Result Value Ref Range   Enterococcus species NOT DETECTED NOT DETECTED   Listeria monocytogenes NOT DETECTED NOT DETECTED   Staphylococcus species NOT DETECTED NOT DETECTED   Staphylococcus aureus NOT DETECTED NOT DETECTED   Streptococcus species NOT DETECTED NOT DETECTED   Streptococcus agalactiae NOT DETECTED NOT DETECTED   Streptococcus pneumoniae NOT DETECTED NOT DETECTED   Streptococcus pyogenes NOT DETECTED NOT DETECTED   Acinetobacter baumannii NOT DETECTED NOT DETECTED   Enterobacteriaceae species DETECTED (A) NOT DETECTED   Enterobacter cloacae complex NOT DETECTED NOT DETECTED   Escherichia coli DETECTED (A) NOT DETECTED   Klebsiella oxytoca NOT DETECTED NOT DETECTED   Klebsiella pneumoniae NOT DETECTED NOT DETECTED   Proteus species NOT DETECTED NOT DETECTED   Serratia marcescens NOT DETECTED NOT DETECTED   Carbapenem resistance NOT DETECTED NOT DETECTED   Haemophilus influenzae NOT DETECTED NOT DETECTED   Neisseria meningitidis NOT DETECTED NOT DETECTED   Pseudomonas aeruginosa NOT DETECTED NOT DETECTED   Candida albicans NOT DETECTED NOT DETECTED   Candida glabrata NOT DETECTED NOT DETECTED   Candida krusei NOT DETECTED NOT DETECTED   Candida parapsilosis NOT DETECTED NOT DETECTED   Candida tropicalis NOT DETECTED NOT DETECTED    Rosabelle Jupin L 12/12/2017  9:27 AM

## 2017-12-12 NOTE — Progress Notes (Signed)
[address] [facility] [place] [name]    MR#:  [id number]  DATE OF BIRTH:  Dec 09, 1924  SUBJECTIVE:  CHIEF COMPLAINT:   Chief Complaint  Patient presents with  . Weakness  . Hyperglycemia   -Patient from home brought in secondary to fever cough and difficulty breathing. -Positive blood cultures.  Off BiPAP this morning -White count still elevated.  REVIEW OF SYSTEMS:  Review of Systems  Constitutional: Positive for malaise/fatigue. Negative for chills and fever.  HENT: Positive for hearing loss. Negative for congestion and nosebleeds.   Eyes: Negative for blurred vision and double vision.  Respiratory: Positive for cough and shortness of breath. Negative for wheezing.   Cardiovascular: Negative for chest pain and palpitations.  Gastrointestinal: Negative for abdominal pain, constipation, diarrhea, nausea and vomiting.  Genitourinary: Negative for dysuria.  Musculoskeletal: Negative for myalgias.  Neurological: Negative for dizziness, speech change, focal weakness, seizures, weakness and headaches.  Psychiatric/Behavioral: Negative for depression.    DRUG ALLERGIES:   Allergies  Allergen Reactions  . Ceftriaxone Swelling  . Cefuroxime Hives  . Cephalosporins Swelling and Other (See Comments)    Reaction:  Fainting/dry mouth   . Codeine Nausea And Vomiting  . Epinephrine Other (See Comments)    Reaction:  Fainting   . Morphine And Related Other (See Comments)    Reaction:  GI upset   . Buprenorphine Hcl Nausea And Vomiting  . Ciprofloxacin Itching, Swelling and Rash  . Latex Rash  . Lidocaine Rash  . Procaine Rash    VITALS:  Blood pressure (!) 119/49, pulse 69, temperature 98.9 F (37.2 C), temperature source Oral, resp. rate (!) 21, height 5\' 7"  (1.702 m), weight 79.4 kg (175 lb), SpO2 94 %.  PHYSICAL EXAMINATION:  Physical Exam  GENERAL:  84 y.o.-year-old elderly patient lying in the bed, ill appearing EYES:  Pupils equal, round, reactive to light and accommodation. No scleral icterus. Extraocular muscles intact.  HEENT: Head atraumatic, normocephalic. Oropharynx and nasopharynx clear.  NECK:  Supple, no jugular venous distention. No thyroid enlargement, no tenderness.  LUNGS: scant breath sounds bilaterally, no wheezing, rales,rhonchi or crepitation. Decreased at the bases. No use of accessory muscles of respiration.  CARDIOVASCULAR: S1, S2 normal. No rubs, or gallops. 3/6 systolic murmur present ABDOMEN: Soft, nontender, nondistended. Bowel sounds present. No organomegaly or mass.  EXTREMITIES: No pedal edema, cyanosis, or clubbing.  NEUROLOGIC: Cranial nerves II through XII are intact. Muscle strength equal  in all extremities. Sensation intact. Gait not checked. Global weakness noted. PSYCHIATRIC: The patient is alert and oriented x 2-3.  SKIN: No obvious rash, lesion, or ulcer.    LABORATORY PANEL:   CBC Recent Labs  Lab 12/12/17 0440  WBC 20.9*  HGB 11.3*  HCT 34.7*  PLT 112*   ------------------------------------------------------------------------------------------------------------------  Chemistries  Recent Labs  Lab 12/11/17 1935 12/12/17 0440  NA  --  138  K  --  4.1  CL  --  103  CO2  --  27  GLUCOSE  --  130*  BUN  --  33*  CREATININE  --  1.13*  CALCIUM  --  9.0  MG  --  2.3  AST 21  --   ALT 13*  --   ALKPHOS 64  --   BILITOT 0.9  --    ------------------------------------------------------------------------------------------------------------------  Cardiac Enzymes Recent Labs  Lab 12/11/17 1944  TROPONINI 0.04*   ------------------------------------------------------------------------------------------------------------------  RADIOLOGY:  Dg Chest 2 View  Result  Date: 12/11/2017 CLINICAL DATA:  Weakness, SHOB, hx of CHF, COPD, HTN, non smoker EXAM: CHEST - 2 VIEW COMPARISON:  11/28/2017 FINDINGS: Patient's LEFT-sided transvenous pacemaker leads to  the RIGHT atrium and RIGHT ventricle. There are patchy airspace filling opacities throughout the lungs, more confluent in the LEFT mid lung zone. Marked kyphosis. Remote posterior lumbar fusion. IMPRESSION: 1. Cardiomegaly and mild edema. 2. Focal airspace filling in the LEFT mid lung zone suspicious for infectious infiltrate. Follow-up is recommended. Electronically Signed   By: Nolon Nations M.D.   On: 12/11/2017 19:58   Dg Abd 1 View  Result Date: 12/11/2017 CLINICAL DATA:  Abdominal distention EXAM: ABDOMEN - 1 VIEW COMPARISON:  05/08/2017 FINDINGS: Postoperative changes with posterior rod and screw fixation of the lower thoracic and lumbosacral spine. Scattered gas and stool in the colon. No small or large bowel distention. No radiopaque stones. Scattered vascular calcifications. IMPRESSION: Nonobstructive bowel gas pattern with scattered stool throughout the colon. Electronically Signed   By: Lucienne Capers M.D.   On: 12/11/2017 23:34    EKG:   Orders placed or performed during the hospital encounter of 12/11/17  . ED EKG 12-Lead  . ED EKG 12-Lead    ASSESSMENT AND PLAN:   84 year old female with past medical history significant for chronic respiratory failure secondary to COPD on 2 L home oxygen, CHF, chronic back pain, atrial fibrillation on Xarelto status post pacemaker, diabetes, PAD and hypertension brought to the hospital secondary to fevers and admitted for sepsis  1.  Sepsis-secondary to bilateral pneumonia. -Blood cultures positive however for E. coli.  urine analysis negative for infection. -Continue on  meropenem. Not on any pressors at this time.  WBC still elevated -Improved lactic acid.  2.  Acute on chronic respiratory failure-secondary to pneumonia - has underlying COPD and CHF - off bipap now, on 3L o2 -Continue bronchodilators.  3.  A. fib-rate controlled.  Status post pacemaker.  On Coreg -Also on Xarelto for anticoagulation  4.  Chronic diastolic CHF-no  exacerbation at this time.  Received a dose of Lasix yesterday. -Echocardiogram showing EF of 65 to 70% with LVH.  Has severe tricuspid regurgitation  5.  GERD-Protonix  6.  DVT prophylaxis-already on Xarelto  Patient is from home.  Ambulates with a walker. -Physical therapy when more stable   All the records are reviewed and case discussed with Care Management/Social Workerr. Management plans discussed with the patient, family and they are in agreement.  CODE STATUS: DNR  TOTAL TIME TAKING CARE OF THIS PATIENT: 38 minutes.   POSSIBLE D/C IN 2-3 DAYS, DEPENDING ON CLINICAL CONDITION.   Gladstone Lighter M.D on 12/12/2017 at 9:32 AM  Between 7am to 6pm - Pager - [phone]  After 6pm go to [email or web address] - password [email or web address]  Office  [phone]  CC: Primary care physician; Tracie Harrier, MD

## 2017-12-12 NOTE — Progress Notes (Signed)
Pharmacy Antibiotic Note  [name] is a 84 y.o. female admitted on 12/11/2017 with bacteremia.  Pharmacy has been consulted for meropenem dosing.  Plan: Blood cultures came back positive for E.coli bacteremia. As discussed with Dr. Celesta Aver, while patient has had allergic reactions to cephalosporins, she has tolerated meropenem in the past for bacteremia in October 2018. Per renal function, initiate meropenem 1g q12h.  Height: 5\' 7"  (170.2 cm) Weight: 175 lb (79.4 kg) IBW/kg (Calculated) : 61.6  Temp (24hrs), Avg:99.7 F (37.6 C), Min:98.6 F (37 C), Max:102.4 F (39.1 C)  Recent Labs  Lab 12/11/17 1907 12/11/17 1944 12/12/17 0440 12/12/17 0457  WBC 19.4*  --  20.9*  --   CREATININE 1.13*  --  1.13*  --   LATICACIDVEN  --  2.5*  --  1.8    Estimated Creatinine Clearance: 35.9 mL/min (A) (by C-G formula based on SCr of 1.13 mg/dL (H)).    Allergies  Allergen Reactions  . Ceftriaxone Swelling  . Cefuroxime Hives  . Cephalosporins Swelling and Other (See Comments)    Reaction:  Fainting/dry mouth   . Codeine Nausea And Vomiting  . Epinephrine Other (See Comments)    Reaction:  Fainting   . Morphine And Related Other (See Comments)    Reaction:  GI upset   . Buprenorphine Hcl Nausea And Vomiting  . Ciprofloxacin Itching, Swelling and Rash  . Latex Rash  . Lidocaine Rash  . Procaine Rash    Antimicrobials this admission: 6/20 aztreonam >> 6/20 6/20 vancomycin >> 6/21 6/21 meropenem >>   Microbiology results: 6/20 BCx: e.coli 6/20 UCx: sent 6/21 MRSA PCR: negative  Thank you for allowing pharmacy to be a part of this patient's care.  Tyson Babinski 12/12/2017 12:26 PM

## 2017-12-12 NOTE — Progress Notes (Signed)
Pt has remained alert and oriented with c/o left shoulder pain that was not relieved from APAP-pt states she fell a few days prior to hospital admission and her family helped her up by pulling on her arms->Xray did not reveal any acute changes. Will see if pain can be managed with an alternative med.  Pt has remained in NSR, BP soft-spironolactone given this pm rather than am.  Pt has remained on 2LNC, SpO2 >90%, lung sounds with rhonchi/diminished in the bases. Pt has had minimal UO 249ml. Minimal appetite. Family has remained at bedside.

## 2017-12-12 NOTE — Progress Notes (Signed)
PULMONARY / CRITICAL CARE MEDICINE   Name: Joel Mericle MRN: [id number] DOB: December 29, 1924    ADMISSION DATE:  12/11/2017  HISTORY OF PRESENT ILLNESS:   This is a 84 year old female who presented to the ED with hypoglycemia, cough, shortness of breath and weakness x2 days.  History is obtained from ED records as patient is on continuous BiPAP and unable to provide a history.  Per ED records, patient's blood sugar was 531 mg/dL at home.  Patient was weak and short of breath hence family called EMS.  At the ED, her chest x-ray showed left lung pneumonia, WBC was 19.4k, and her blood sugar was 370 mg/dL.  She is being admitted to the ICU for further management.     REVIEW OF SYSTEMS:   unobtainable  SUBJECTIVE:  Patient examined on BIPAP. Did not follow any commands.  VITAL SIGNS: BP (!) 119/49 (BP Location: Left Arm)   Pulse 69   Temp 98.9 F (37.2 C) (Oral)   Resp (!) 21   Ht 5\' 7"  (1.702 m)   Wt 175 lb (79.4 kg)   SpO2 94%   BMI 27.41 kg/m   HEMODYNAMICS:    VENTILATOR SETTINGS: FiO2 (%):  [45 %] 45 %  INTAKE / OUTPUT: I/O last 3 completed shifts: In: 417.5 [I.V.:117.5; IV Piggyback:300] Out: 250 [Urine:250]  PHYSICAL EXAMINATION: General: No acute distress Neuro: Awake, alert and appropriate HEENT: PERRLA, no JVD, trachea midline Cardiovascular: Paced rhythm,no murmur regurg or gallop, +2 pulses bilaterally, trace edema Lungs:  diffuse bibasilar rhonchi, off BIPAP on 3 liters [place] Abdomen: Inaudible bowel sounds Musculoskeletal:Arthritic deformities of the hands Skin: Warm and dry, left foot with dressing intact   LABS:  BMET Recent Labs  Lab 12/11/17 1907 12/12/17 0440  NA 136 138  K 4.5 4.1  CL 102 103  CO2 24 27  BUN 39* 33*  CREATININE 1.13* 1.13*  GLUCOSE 370* 130*    Electrolytes Recent Labs  Lab 12/11/17 1907 12/12/17 0440  CALCIUM 9.2 9.0  MG  --  2.3  PHOS  --  2.8    CBC Recent Labs  Lab 12/11/17 1907 12/12/17 0440  WBC  19.4* 20.9*  HGB 11.7* 11.3*  HCT 35.7 34.7*  PLT 117* 112*    Coag's No results for input(s): APTT, INR in the last 168 hours.  Sepsis Markers Recent Labs  Lab 12/11/17 1944 12/12/17 0457  LATICACIDVEN 2.5* 1.8    ABG No results for input(s): PHART, PCO2ART, PO2ART in the last 168 hours.  Liver Enzymes Recent Labs  Lab 12/11/17 1935  AST 21  ALT 13*  ALKPHOS 64  BILITOT 0.9  ALBUMIN 3.6    Cardiac Enzymes Recent Labs  Lab 12/11/17 1944  TROPONINI 0.04*    Glucose Recent Labs  Lab 12/11/17 1907 12/11/17 2204 12/11/17 2340 12/12/17 0423 12/12/17 0729  GLUCAP 374* 292* 252* 128* 157*    Imaging Dg Chest 2 View  Result Date: 12/11/2017 CLINICAL DATA:  Weakness, SHOB, hx of CHF, COPD, HTN, non smoker EXAM: CHEST - 2 VIEW COMPARISON:  11/28/2017 FINDINGS: Patient's LEFT-sided transvenous pacemaker leads to the RIGHT atrium and RIGHT ventricle. There are patchy airspace filling opacities throughout the lungs, more confluent in the LEFT mid lung zone. Marked kyphosis. Remote posterior lumbar fusion. IMPRESSION: 1. Cardiomegaly and mild edema. 2. Focal airspace filling in the LEFT mid lung zone suspicious for infectious infiltrate. Follow-up is recommended. Electronically Signed   By: Nolon Nations M.D.   On: 12/11/2017 19:58  Dg Abd 1 View  Result Date: 12/11/2017 CLINICAL DATA:  Abdominal distention EXAM: ABDOMEN - 1 VIEW COMPARISON:  05/08/2017 FINDINGS: Postoperative changes with posterior rod and screw fixation of the lower thoracic and lumbosacral spine. Scattered gas and stool in the colon. No small or large bowel distention. No radiopaque stones. Scattered vascular calcifications. IMPRESSION: Nonobstructive bowel gas pattern with scattered stool throughout the colon. Electronically Signed   By: Lucienne Capers M.D.   On: 12/11/2017 23:34      CULTURES: 6/20 Blood- E.Coli  ANTIBIOTICS: 6/20 Aztreonam, Vancomycin   SIGNIFICANT EVENTS: 6/20  Admitted to ICU  LINES/TUBES: Peripheral IV, Foley  DISCUSSION: This 84 year old lady was admitted  with hypoglycemia, pneumonia and sepsis.  Today her Blood cultures are growing E.Coli.   ASSESSMENT / PLAN: 1. Sepsis secondary to E.Coli bacteremia.  Of note, this patient was treated for E.Coli bacteremia in June and October of 2018.  Unclear source since urine is negative.  ? GI. Plan: Will D/C Aztreonam and start Meropenum. Will continue with Vancomycin until PCR is back for MRSA.  Will need ECHO in a few days.  2. Acute hypoxic Respiratory Failure secondary to Community acquiredPneumonia. Plan: continue on ABX, lung recruitment, NIPPV, try to acquire sputum culture.  3. Metabolic Encephalopathy from sepsis.  However it is reported that patient had been awake on admission. Plan: check ABG. Close monitor.  4. Acute Kidney injury from sepsis. Plan: Monitor of renal function   FAMILY  - Updates: Husband updated at bedside.  Thank you for allowing me the privileges to care for this patient.  I have dedicated a total of 37 minutes in critical care time minus all appropriate exclusions.  Cammie Sickle, MD [facility] [place] Pager: [phone]  12/12/2017, 9:43 AM

## 2017-12-13 DIAGNOSIS — D691 Qualitative platelet defects: Secondary | ICD-10-CM

## 2017-12-13 LAB — CBC WITH DIFFERENTIAL/PLATELET
Basophils Absolute: 0.1 10*3/uL (ref 0–0.1)
Basophils Relative: 1 %
EOS ABS: 0.2 10*3/uL (ref 0–0.7)
EOS PCT: 2 %
HCT: 32.8 % — ABNORMAL LOW (ref 35.0–47.0)
Hemoglobin: 11 g/dL — ABNORMAL LOW (ref 12.0–16.0)
LYMPHS ABS: 1 10*3/uL (ref 1.0–3.6)
Lymphocytes Relative: 9 %
MCH: 30.3 pg (ref 26.0–34.0)
MCHC: 33.4 g/dL (ref 32.0–36.0)
MCV: 90.7 fL (ref 80.0–100.0)
MONO ABS: 0.8 10*3/uL (ref 0.2–0.9)
Monocytes Relative: 7 %
Neutro Abs: 9.5 10*3/uL — ABNORMAL HIGH (ref 1.4–6.5)
Neutrophils Relative %: 81 %
PLATELETS: 94 10*3/uL — AB (ref 150–440)
RBC: 3.61 MIL/uL — AB (ref 3.80–5.20)
RDW: 18.1 % — AB (ref 11.5–14.5)
WBC: 11.5 10*3/uL — ABNORMAL HIGH (ref 3.6–11.0)

## 2017-12-13 LAB — BASIC METABOLIC PANEL
Anion gap: 5 (ref 5–15)
BUN: 31 mg/dL — AB (ref 6–20)
CHLORIDE: 109 mmol/L (ref 101–111)
CO2: 25 mmol/L (ref 22–32)
CREATININE: 0.87 mg/dL (ref 0.44–1.00)
Calcium: 8.5 mg/dL — ABNORMAL LOW (ref 8.9–10.3)
GFR calc Af Amer: >60 mL/min (ref >60)
GFR, EST NON AFRICAN AMERICAN: 57 mL/min — AB (ref >60)
GLUCOSE: 122 mg/dL — AB (ref 65–99)
Potassium: 4.4 mmol/L (ref 3.5–5.1)
SODIUM: 139 mmol/L (ref 135–145)

## 2017-12-13 LAB — PHOSPHORUS: Phosphorus: 2.7 mg/dL (ref 2.5–4.6)

## 2017-12-13 LAB — GLUCOSE, CAPILLARY
GLUCOSE-CAPILLARY: 152 mg/dL — AB (ref 65–99)
GLUCOSE-CAPILLARY: 153 mg/dL — AB (ref 65–99)
Glucose-Capillary: 154 mg/dL — ABNORMAL HIGH (ref 65–99)
Glucose-Capillary: 157 mg/dL — ABNORMAL HIGH (ref 65–99)
Glucose-Capillary: 174 mg/dL — ABNORMAL HIGH (ref 65–99)
Glucose-Capillary: 209 mg/dL — ABNORMAL HIGH (ref 65–99)
Glucose-Capillary: 226 mg/dL — ABNORMAL HIGH (ref 65–99)

## 2017-12-13 LAB — URINE CULTURE: Culture: NO GROWTH

## 2017-12-13 LAB — MAGNESIUM: MAGNESIUM: 2.3 mg/dL (ref 1.7–2.4)

## 2017-12-13 MED ORDER — IPRATROPIUM-ALBUTEROL 0.5-2.5 (3) MG/3ML IN SOLN
3.0000 mL | Freq: Three times a day (TID) | RESPIRATORY_TRACT | Status: DC
  Administered 2017-12-14 – 2017-12-15 (×5): 3 mL via RESPIRATORY_TRACT
  Filled 2017-12-13 (×5): qty 3

## 2017-12-13 NOTE — Progress Notes (Signed)
PT Cancellation Note  Patient Details [name] MRN: [id number] DOB: 11/03/1924   Cancelled Treatment:    Reason Eval/Treat Not Completed: (Consult received and chart reviewed. Initial attempt, patient preparing for transfer from CCU to 1C; second attempt, patient declined, "just don't feel up to it today".  Requests therapist re-attempt next date. Will continue next date as appropriate.)   Raynold Blankenbaker H. Owens Shark, PT, DPT, NCS 12/13/17, 3:36 PM [phone]

## 2017-12-13 NOTE — Progress Notes (Signed)
[facility] [name]    MR#:  [id number]  DATE OF BIRTH:  10-15-1924  SUBJECTIVE:  CHIEF COMPLAINT:   Chief Complaint  Patient presents with  . Weakness  . Hyperglycemia    -Feels much better today. -Remains off BiPAP.  On 3 L oxygen via nasal cannula. -Complains of significant weakness  REVIEW OF SYSTEMS:  Review of Systems  Constitutional: Positive for malaise/fatigue. Negative for chills and fever.  HENT: Positive for hearing loss. Negative for congestion and nosebleeds.   Eyes: Negative for blurred vision and double vision.  Respiratory: Positive for shortness of breath. Negative for cough and wheezing.   Cardiovascular: Negative for chest pain and palpitations.  Gastrointestinal: Negative for abdominal pain, constipation, diarrhea, nausea and vomiting.  Genitourinary: Negative for dysuria.  Musculoskeletal: Negative for myalgias.  Neurological: Negative for dizziness, speech change, focal weakness, seizures, weakness and headaches.  Psychiatric/Behavioral: Negative for depression.    DRUG ALLERGIES:   Allergies  Allergen Reactions  . Ceftriaxone Swelling  . Cefuroxime Hives  . Cephalosporins Swelling and Other (See Comments)    Reaction:  Fainting/dry mouth   . Codeine Nausea And Vomiting  . Epinephrine Other (See Comments)    Reaction:  Fainting   . Morphine And Related Other (See Comments)    Reaction:  GI upset   . Buprenorphine Hcl Nausea And Vomiting  . Ciprofloxacin Itching, Swelling and Rash  . Latex Rash  . Lidocaine Rash  . Procaine Rash    VITALS:  Blood pressure (!) 129/52, pulse 75, temperature 98.9 F (37.2 C), temperature source Oral, resp. rate (!) 22, height 5\' 7"  (1.702 m), weight 79.4 kg (175 lb), SpO2 98 %.  PHYSICAL EXAMINATION:  Physical Exam  GENERAL:  84 y.o.-year-old elderly patient lying in the bed, ill appearing EYES: Pupils equal, round, reactive to light and  accommodation. No scleral icterus. Extraocular muscles intact.  HEENT: Head atraumatic, normocephalic. Oropharynx and nasopharynx clear.  NECK:  Supple, no jugular venous distention. No thyroid enlargement, no tenderness.  LUNGS: Improved breath sounds bilaterally, no wheezing, rales,rhonchi or crepitation. Decreased at the bases. No use of accessory muscles of respiration.  CARDIOVASCULAR: S1, S2 normal. No rubs, or gallops. 3/6 systolic murmur present ABDOMEN: Soft, nontender, nondistended. Bowel sounds present. No organomegaly or mass.  EXTREMITIES: No pedal edema, cyanosis, or clubbing.  NEUROLOGIC: Cranial nerves II through XII are intact. Muscle strength equal  in all extremities. Sensation intact. Gait not checked. Global weakness noted. PSYCHIATRIC: The patient is alert and oriented x 3.  SKIN: No obvious rash, lesion, or ulcer.    LABORATORY PANEL:   CBC Recent Labs  Lab 12/13/17 0606  WBC 11.5*  HGB 11.0*  HCT 32.8*  PLT 94*   ------------------------------------------------------------------------------------------------------------------  Chemistries  Recent Labs  Lab 12/11/17 1935  12/13/17 0606  NA  --    < > 139  K  --    < > 4.4  CL  --    < > 109  CO2  --    < > 25  GLUCOSE  --    < > 122*  BUN  --    < > 31*  CREATININE  --    < > 0.87  CALCIUM  --    < > 8.5*  MG  --    < > 2.3  AST 21  --   --   ALT 13*  --   --  ALKPHOS 64  --   --   BILITOT 0.9  --   --    < > = values in this interval not displayed.   ------------------------------------------------------------------------------------------------------------------  Cardiac Enzymes Recent Labs  Lab 12/11/17 1944  TROPONINI 0.04*   ------------------------------------------------------------------------------------------------------------------  RADIOLOGY:  Dg Chest 2 View  Result Date: 12/11/2017 CLINICAL DATA:  Weakness, SHOB, hx of CHF, COPD, HTN, non smoker EXAM: CHEST - 2 VIEW  COMPARISON:  11/28/2017 FINDINGS: Patient's LEFT-sided transvenous pacemaker leads to the RIGHT atrium and RIGHT ventricle. There are patchy airspace filling opacities throughout the lungs, more confluent in the LEFT mid lung zone. Marked kyphosis. Remote posterior lumbar fusion. IMPRESSION: 1. Cardiomegaly and mild edema. 2. Focal airspace filling in the LEFT mid lung zone suspicious for infectious infiltrate. Follow-up is recommended. Electronically Signed   By: Nolon Nations M.D.   On: 12/11/2017 19:58   Dg Abd 1 View  Result Date: 12/11/2017 CLINICAL DATA:  Abdominal distention EXAM: ABDOMEN - 1 VIEW COMPARISON:  05/08/2017 FINDINGS: Postoperative changes with posterior rod and screw fixation of the lower thoracic and lumbosacral spine. Scattered gas and stool in the colon. No small or large bowel distention. No radiopaque stones. Scattered vascular calcifications. IMPRESSION: Nonobstructive bowel gas pattern with scattered stool throughout the colon. Electronically Signed   By: Lucienne Capers M.D.   On: 12/11/2017 23:34   Dg Shoulder Left  Result Date: 12/12/2017 CLINICAL DATA:  Acute left shoulder pain. EXAM: LEFT SHOULDER - 2+ VIEW COMPARISON:  None. FINDINGS: No acute fracture or dislocation. Mild acromioclavicular joint space narrowing with marginal osteophyte formation. The glenohumeral joint space is preserved. Osteopenia. Soft tissues are unremarkable. Edema and atelectasis at the left lung base. IMPRESSION: 1.  No acute osseous abnormality. 2. Mild acromioclavicular osteoarthritis. Electronically Signed   By: Titus Dubin M.D.   On: 12/12/2017 14:21    EKG:   Orders placed or performed during the hospital encounter of 12/11/17  . ED EKG 12-Lead  . ED EKG 12-Lead    ASSESSMENT AND PLAN:   84 year old female with past medical history significant for chronic respiratory failure secondary to COPD on 2 L home oxygen, CHF, chronic back pain, atrial fibrillation on Xarelto status  post pacemaker, diabetes, PAD and hypertension brought to the hospital secondary to fevers and admitted for sepsis  1.  Sepsis-secondary to bilateral pneumonia. -Blood cultures positive however for E. coli.  urine analysis negative for infection. -Continue on  meropenem. Not on any pressors at this time.  WBC improving -Improved lactic acid.  2.  Acute on chronic respiratory failure-secondary to pneumonia - has underlying COPD and CHF - off bipap now, on 3L o2-chronically on 2 L at home -Continue bronchodilators.  3.  A. fib-rate controlled.  Status post pacemaker.  On Coreg -Also on Xarelto for anticoagulation  4.  Chronic diastolic CHF-no exacerbation at this time.  Received a dose of Lasix on admission. -Echocardiogram from January 2019 showing EF of 65 to 70% with LVH.  Has severe tricuspid regurgitation -Discontinue IV fluids  5.  GERD-Protonix  6.  DVT prophylaxis-already on Xarelto  Patient is from home.  Ambulates with a walker. -Physical therapy consulted   All the records are reviewed and case discussed with Care Management/Social Workerr. Management plans discussed with the patient, family and they are in agreement.  CODE STATUS: DNR  TOTAL TIME TAKING CARE OF THIS PATIENT: 36 minutes.   POSSIBLE D/C IN 1-2 DAYS, DEPENDING ON CLINICAL CONDITION.   Kandis Nab.D  on 12/13/2017 at 11:21 AM  Between 7am to 6pm - Pager - [phone]  After 6pm go to [email or web address] - password [email or web address]  Office  [phone]  CC: Primary care physician; Tracie Harrier, MD

## 2017-12-13 NOTE — Progress Notes (Signed)
PULMONARY / CRITICAL CARE MEDICINE   [name] MRN: [id number] DOB: 09/07/1924    ADMISSION DATE:  12/11/2017  HISTORY OF PRESENT ILLNESS:   This is a 84 year old female who presented to the ED with hypoglycemia, cough, shortness of breath and weakness x2 days.  History is obtained from ED records as patient is on continuous BiPAP and unable to provide a history.  Per ED records, patient's blood sugar was 531 mg/dL at home.  Patient was weak and short of breath hence family called EMS.  At the ED, her chest x-ray showed left lung pneumonia, WBC was 19.4k, and her blood sugar was 370 mg/dL.  She is being admitted to the ICU for further management.    REVIEW OF SYSTEMS:   Review of Systems  Constitutional: Negative for chills and fever.  HENT: Negative.   Eyes: Negative.   Cardiovascular: Negative.   Skin: Negative.     SUBJECTIVE:  Patient reports that she feels much better.  VITAL SIGNS: BP (!) 129/52   Pulse 75   Temp 98.9 F (37.2 C) (Oral)   Resp (!) 22   Ht 5\' 7"  (1.702 m)   Wt 175 lb (79.4 kg)   SpO2 98%   BMI 27.41 kg/m   HEMODYNAMICS:  no compromise  OXYGEN:  2 [place]  INTAKE / OUTPUT: I/O last 3 completed shifts: In: 2290.2 [P.O.:240; I.V.:1373.5; IV Piggyback:676.7] Out: 875 [Urine:875]  PHYSICAL EXAMINATION: General: Comfortable  Neuro: Alert and oriented x2, moves all extremities, follows some basic commands HEENT: PERRLA, no JVD, trachea midline Cardiovascular: Apical pulse regular, S1-S2, no murmur regurg or gallop, +2 pulses bilaterally, trace edema Lungs: good A/E bilaterally, no rhonchi Abdomen: Nontender,  normal bowel sounds in all 4 quadrants, mildly distended Musculoskeletal: Positive range of motion in upper and lower extremities Skin: Warm and dry, left foot with dressing intact   LABS:  BMET Recent Labs  Lab 12/11/17 1907 12/12/17 0440 12/13/17 0606  NA 136 138 139  K 4.5 4.1 4.4  CL 102 103 109  CO2 24 27 25   BUN  39* 33* 31*  CREATININE 1.13* 1.13* 0.87  GLUCOSE 370* 130* 122*    Electrolytes Recent Labs  Lab 12/11/17 1907 12/12/17 0440 12/13/17 0606  CALCIUM 9.2 9.0 8.5*  MG  --  2.3 2.3  PHOS  --  2.8 2.7    CBC Recent Labs  Lab 12/11/17 1907 12/12/17 0440 12/13/17 0606  WBC 19.4* 20.9* 11.5*  HGB 11.7* 11.3* 11.0*  HCT 35.7 34.7* 32.8*  PLT 117* 112* 94*    Coag's No results for input(s): APTT, INR in the last 168 hours.  Sepsis Markers Recent Labs  Lab 12/11/17 1944 12/12/17 0457  LATICACIDVEN 2.5* 1.8    ABG No results for input(s): PHART, PCO2ART, PO2ART in the last 168 hours.  Liver Enzymes Recent Labs  Lab 12/11/17 1935  AST 21  ALT 13*  ALKPHOS 64  BILITOT 0.9  ALBUMIN 3.6    Cardiac Enzymes Recent Labs  Lab 12/11/17 1944  TROPONINI 0.04*    Glucose Recent Labs  Lab 12/12/17 1502 12/12/17 1935 12/12/17 2345 12/13/17 0405 12/13/17 0742 12/13/17 1201  GLUCAP 200* 174* 123* 154* 152* 226*    Imaging Dg Shoulder Left  Result Date: 12/12/2017 CLINICAL DATA:  Acute left shoulder pain. EXAM: LEFT SHOULDER - 2+ VIEW COMPARISON:  None. FINDINGS: No acute fracture or dislocation. Mild acromioclavicular joint space narrowing with marginal osteophyte formation. The glenohumeral joint space is preserved. Osteopenia.  Soft tissues are unremarkable. Edema and atelectasis at the left lung base. IMPRESSION: 1.  No acute osseous abnormality. 2. Mild acromioclavicular osteoarthritis. Electronically Signed   By: Titus Dubin M.D.   On: 12/12/2017 14:21      CULTURES: 6/20 E. Coli bacteremia MRSA PCR negative  ANTIBIOTICS: 6/21 Meropenum   SIGNIFICANT EVENTS: 6/20 admitted to ICU  LINES/TUBES: Peripheral IVs  DISCUSSION: This 84 year old lady was admitted with sepsis and acute respiratory failure requiring BiPAP.  Her blood cultures came back positive for E. Coli.  She is much improved today.  ASSESSMENT / PLAN: Sepsis secondary to  pneumonia Acute hypoxic respiratory failure secondary to pneumonia much improved- has been off BIPAP for 48 hours Multifocal pneumonia Type 2 diabetes with hyperglycemia CKD stage II with acute kidney injury Acute on chronic COPD exacerbation Acute on chronic diastolic heart failure History of hypertension Hx DM with hyperglycemia Thrombocytopenia- close follow up  PLAN  Antibiotics as above Follow-up cultures Blood glucose monitoring with sliding scale insulin coverage Nebulized bronchodilators Advance diet PRN Tylenol for fever Avoid nephrotoxic meds and all sedatives GI and DVT prophylaxis Patient is a DNR/DNI    FAMILY  - Updates: Husband updated  Disp: transfer to telemetry   Cammie Sickle, MD Pul[facility] and [place] Pager: [phone]  12/13/2017, 12:09 PM

## 2017-12-13 NOTE — Progress Notes (Signed)
Pt has remained alert and oriented with no c/o pain. Pt has remained ventricular paced on cardiac monitor, BP slightly HTN->NS 50 has been stopped. Pt has transitioned to [facility], NDN, SpO2>90%. Pt has adequate PO intake. Pt has had 2 loose stools today-colace has been HELD. Family remains at bedside. Pt with orders to transfer to med-surg.

## 2017-12-14 ENCOUNTER — Inpatient Hospital Stay
Admit: 2017-12-14 | Discharge: 2017-12-14 | Disposition: A | Discharge status: Home / Self Care | Payer: Medicare Other | Attending: Internal Medicine | Admitting: Internal Medicine

## 2017-12-14 LAB — BASIC METABOLIC PANEL
ANION GAP: 6 (ref 5–15)
BUN: 23 mg/dL — ABNORMAL HIGH (ref 6–20)
CO2: 24 mmol/L (ref 22–32)
CREATININE: 0.76 mg/dL (ref 0.44–1.00)
Calcium: 8.7 mg/dL — ABNORMAL LOW (ref 8.9–10.3)
Chloride: 110 mmol/L (ref 101–111)
Glucose, Bld: 114 mg/dL — ABNORMAL HIGH (ref 65–99)
Potassium: 4.7 mmol/L (ref 3.5–5.1)
SODIUM: 140 mmol/L (ref 135–145)

## 2017-12-14 LAB — CULTURE, BLOOD (ROUTINE X 2)
SPECIAL REQUESTS: ADEQUATE
Special Requests: ADEQUATE

## 2017-12-14 LAB — GLUCOSE, CAPILLARY
GLUCOSE-CAPILLARY: 126 mg/dL — AB (ref 65–99)
GLUCOSE-CAPILLARY: 229 mg/dL — AB (ref 65–99)
GLUCOSE-CAPILLARY: 162 mg/dL — AB (ref 65–99)
GLUCOSE-CAPILLARY: 191 mg/dL — AB (ref 65–99)
Glucose-Capillary: 121 mg/dL — ABNORMAL HIGH (ref 65–99)

## 2017-12-14 LAB — CBC
HCT: 32.1 % — ABNORMAL LOW (ref 35.0–47.0)
HEMOGLOBIN: 10.9 g/dL — AB (ref 12.0–16.0)
MCH: 30.4 pg (ref 26.0–34.0)
MCHC: 34 g/dL (ref 32.0–36.0)
MCV: 89.4 fL (ref 80.0–100.0)
PLATELETS: 105 10*3/uL — AB (ref 150–440)
RBC: 3.59 MIL/uL — AB (ref 3.80–5.20)
RDW: 17.8 % — ABNORMAL HIGH (ref 11.5–14.5)
WBC: 8.8 10*3/uL (ref 3.6–11.0)

## 2017-12-14 MED ORDER — FUROSEMIDE 10 MG/ML IJ SOLN
40.0000 mg | Freq: Once | INTRAMUSCULAR | Status: AC
  Administered 2017-12-14: 40 mg via INTRAVENOUS
  Filled 2017-12-14: qty 4

## 2017-12-14 MED ORDER — FUROSEMIDE 40 MG PO TABS
40.0000 mg | ORAL_TABLET | Freq: Every day | ORAL | Status: DC
  Administered 2017-12-15: 40 mg via ORAL
  Filled 2017-12-14: qty 1

## 2017-12-14 MED ORDER — SODIUM CHLORIDE 0.9 % IV SOLN
1.0000 g | Freq: Two times a day (BID) | INTRAVENOUS | Status: DC
  Administered 2017-12-14 – 2017-12-15 (×2): 1 g via INTRAVENOUS
  Filled 2017-12-14 (×4): qty 1

## 2017-12-14 NOTE — Progress Notes (Signed)
[facility] [place] [name]    MR#:  [id number]  DATE OF BIRTH:  05-26-1925  SUBJECTIVE:  CHIEF COMPLAINT:   Chief Complaint  Patient presents with  . Weakness  . Hyperglycemia    -Transferred out of ICU.  E. coli bacteremia.  Allergy to several medications. -Feels extremely weak.  Physical therapy recommended rehab  REVIEW OF SYSTEMS:  Review of Systems  Constitutional: Positive for malaise/fatigue. Negative for chills and fever.  HENT: Positive for hearing loss. Negative for congestion and nosebleeds.   Eyes: Negative for blurred vision and double vision.  Respiratory: Positive for shortness of breath. Negative for cough and wheezing.   Cardiovascular: Negative for chest pain and palpitations.  Gastrointestinal: Negative for abdominal pain, constipation, diarrhea, nausea and vomiting.  Genitourinary: Negative for dysuria.  Musculoskeletal: Negative for myalgias.  Neurological: Negative for dizziness, speech change, focal weakness, seizures, weakness and headaches.  Psychiatric/Behavioral: Negative for depression.    DRUG ALLERGIES:   Allergies  Allergen Reactions  . Ceftriaxone Swelling  . Cefuroxime Hives  . Cephalosporins Swelling and Other (See Comments)    Reaction:  Fainting/dry mouth   . Codeine Nausea And Vomiting  . Epinephrine Other (See Comments)    Reaction:  Fainting   . Morphine And Related Other (See Comments)    Reaction:  GI upset   . Buprenorphine Hcl Nausea And Vomiting  . Ciprofloxacin Itching, Swelling and Rash  . Latex Rash  . Lidocaine Rash  . Procaine Rash    VITALS:  Blood pressure (!) 130/50, pulse 72, temperature 98.7 F (37.1 C), temperature source Oral, resp. rate (!) 1, height 5\' 7"  (1.702 m), weight 79.4 kg (175 lb), SpO2 96 %.  PHYSICAL EXAMINATION:  Physical Exam  GENERAL:  84 y.o.-year-old elderly patient lying in the bed, ill appearing EYES: Pupils equal, round,  reactive to light and accommodation. No scleral icterus. Extraocular muscles intact.  HEENT: Head atraumatic, normocephalic. Oropharynx and nasopharynx clear.  NECK:  Supple, no jugular venous distention. No thyroid enlargement, no tenderness.  LUNGS: Improved breath sounds bilaterally, no wheezing, rhonchi or crepitation. Decreased at the bases. No use of accessory muscles of respiration.  Bibasilar crackles heard CARDIOVASCULAR: S1, S2 normal. No rubs, or gallops. 3/6 systolic murmur present ABDOMEN: Soft, nontender, nondistended. Bowel sounds present. No organomegaly or mass.  EXTREMITIES: No pedal edema, cyanosis, or clubbing.  NEUROLOGIC: Cranial nerves II through XII are intact. Muscle strength equal  in all extremities. Sensation intact. Gait not checked. Global weakness noted. PSYCHIATRIC: The patient is alert and oriented x 3.  SKIN: No obvious rash, lesion, or ulcer.    LABORATORY PANEL:   CBC Recent Labs  Lab 12/14/17 0405  WBC 8.8  HGB 10.9*  HCT 32.1*  PLT 105*   ------------------------------------------------------------------------------------------------------------------  Chemistries  Recent Labs  Lab 12/11/17 1935  12/13/17 0606 12/14/17 0405  NA  --    < > 139 140  K  --    < > 4.4 4.7  CL  --    < > 109 110  CO2  --    < > 25 24  GLUCOSE  --    < > 122* 114*  BUN  --    < > 31* 23*  CREATININE  --    < > 0.87 0.76  CALCIUM  --    < > 8.5* 8.7*  MG  --    < > 2.3  --  AST 21  --   --   --   ALT 13*  --   --   --   ALKPHOS 64  --   --   --   BILITOT 0.9  --   --   --    < > = values in this interval not displayed.   ------------------------------------------------------------------------------------------------------------------  Cardiac Enzymes Recent Labs  Lab 12/11/17 1944  TROPONINI 0.04*   ------------------------------------------------------------------------------------------------------------------  RADIOLOGY:  Dg Shoulder  Left  Result Date: 12/12/2017 CLINICAL DATA:  Acute left shoulder pain. EXAM: LEFT SHOULDER - 2+ VIEW COMPARISON:  None. FINDINGS: No acute fracture or dislocation. Mild acromioclavicular joint space narrowing with marginal osteophyte formation. The glenohumeral joint space is preserved. Osteopenia. Soft tissues are unremarkable. Edema and atelectasis at the left lung base. IMPRESSION: 1.  No acute osseous abnormality. 2. Mild acromioclavicular osteoarthritis. Electronically Signed   By: Titus Dubin M.D.   On: 12/12/2017 14:21    EKG:   Orders placed or performed during the hospital encounter of 12/11/17  . ED EKG 12-Lead  . ED EKG 12-Lead    ASSESSMENT AND PLAN:   84 year old female with past medical history significant for chronic respiratory failure secondary to COPD on 2 L home oxygen, CHF, chronic back pain, atrial fibrillation on Xarelto status post pacemaker, diabetes, PAD and hypertension brought to the hospital secondary to fevers and admitted for sepsis  1.  Sepsis-secondary to bilateral pneumonia. -Blood cultures positive however for E. coli.  urine analysis negative for infection. -Continue on  meropenem.  Finding oral medications that she is not allergic to for discharge will be a challenge Not on any pressors at this time.  WBC improving -Improved lactic acid. -Intensivist recommended echocardiogram to rule out endocarditis with her recurrent bacteremia admissions  2.  Acute on chronic respiratory failure-secondary to pneumonia - has underlying COPD and CHF - off bipap now, on 3L o2-chronically on 2 L at home -Continue bronchodilators.  3.  A. fib-rate controlled.  Status post pacemaker.  On Coreg -Also on Xarelto for anticoagulation  4.  Chronic diastolic CHF-Lasix held at admission and also patient received IV fluids.    Developed some crackles today. -We will give 1 dose Lasix IV today and restart oral Lasix from tomorrow -Echocardiogram from January 2019 showing  EF of 65 to 70% with LVH.  Has severe tricuspid regurgitation  5.  GERD-Protonix  6.  DVT prophylaxis-already on Xarelto  Patient is from home.  Ambulates with a walker. -Physical therapy consulted-they have recommended rehab.  Family discussing about it   All the records are reviewed and case discussed with Care Management/Social Workerr. Management plans discussed with the patient, family and they are in agreement.  CODE STATUS: DNR  TOTAL TIME TAKING CARE OF THIS PATIENT: 36 minutes.   POSSIBLE D/C IN 1-2 DAYS, DEPENDING ON CLINICAL CONDITION.   Gladstone Lighter M.D on 12/14/2017 at 11:44 AM  Between 7am to 6pm - Pager - [phone]  After 6pm go to [email or web address] - password [email or web address]  Office  [phone]  CC: Primary care physician; Tracie Harrier, MD

## 2017-12-14 NOTE — NC FL2 (Signed)
[place] LEVEL OF CARE SCREENING TOOL     IDENTIFICATION  Patient [name] Birthdate: [birth year] Sex: female Admission Date (Current Location): 12/11/2017  [place] and [place] Number:  Engineering geologist and Address:  [facility], [address], [place], [place] [address]      Provider Number: [id number]  Attending Physician Name and Address:  Gladstone Lighter, MD  Relative Name and Phone Number:  Mirenda Baltazar (Spouse) [phone] or [name] (daughter) [phone]    Current Level of Care: Hospital Recommended Level of Care: [place] Prior Approval Number:    Date Approved/Denied:   PASRR Number: [id number]  Discharge Plan: SNF    Current Diagnoses: Patient Active Problem List   Diagnosis Date Noted  . Pneumonia 12/11/2017  . CAP (community acquired pneumonia) 04/14/2017  . COPD (chronic obstructive pulmonary disease) ([place]) 12/07/2016  . UTI (urinary tract infection) 12/07/2016  . Ventral hernia without obstruction or gangrene   . Hyponatremia 09/07/2015  . Acute respiratory failure ([place]) 09/01/2015  . Atrial fibrillation ([place]) 04/10/2015  . Essential hypertension 04/10/2015  . Diabetes ([place]) 04/10/2015  . Acute on chronic combined systolic and diastolic CHF (congestive heart failure) ([place]) 03/21/2015  . Pulmonary nodule 11/10/2014    Orientation RESPIRATION BLADDER Height & Weight     Self, Time, Situation, Place  O2(2L o2) Continent Weight: 175 lb (79.4 kg) Height:  5\' 7"  (170.2 cm)  BEHAVIORAL SYMPTOMS/MOOD NEUROLOGICAL BOWEL NUTRITION STATUS      Continent Diet(Heart healthy/Carb modified)  AMBULATORY STATUS COMMUNICATION OF NEEDS Skin   Extensive Assist Verbally Normal                       Personal Care Assistance Level of Assistance  Bathing, Feeding, Dressing Bathing Assistance: Limited assistance Feeding assistance: Independent Dressing Assistance: Limited  assistance     Functional Limitations Info  Sight, Hearing, Speech Sight Info: Adequate Hearing Info: Adequate Speech Info: Adequate    SPECIAL CARE FACTORS FREQUENCY  PT (By licensed PT)     PT Frequency: 5X per week              Contractures Contractures Info: Not present    Additional Factors Info  Code Status, Allergies Code Status Info: DNR Allergies Info: Ceftriaxone, Cefuroxime, Cephalosporins, Codeine, Epinephrine, Morphine And Related, Buprenorphine Hcl, Ciprofloxacin, Latex, Lidocaine, Procaine           Current Medications (12/14/2017):  This is the current hospital active medication list Current Facility-Administered Medications  Medication Dose Route Frequency Provider Last Rate Last Dose  . acetaminophen (TYLENOL) tablet 650 mg  650 mg Oral Q6H PRN Hillary Bow, MD   650 mg at 12/12/17 1339   Or  . acetaminophen (TYLENOL) suppository 650 mg  650 mg Rectal Q6H PRN Hillary Bow, MD   650 mg at 12/11/17 2255  . albuterol (PROVENTIL) (2.5 MG/3ML) 0.083% nebulizer solution 2.5 mg  2.5 mg Nebulization Q2H PRN Sudini, Alveta Heimlich, MD      . aspirin EC tablet 81 mg  81 mg Oral Daily Hillary Bow, MD   81 mg at 12/14/17 1000  . baclofen (LIORESAL) tablet 10 mg  10 mg Oral TID PRN Hillary Bow, MD      . bisacodyl (DULCOLAX) suppository 10 mg  10 mg Rectal Once Tukov-Yual, Magdalene S, NP      . buPROPion (WELLBUTRIN XL) 24 hr tablet 150 mg  150 mg Oral Daily Hillary Bow, MD  150 mg at 12/14/17 1003  . carvedilol (COREG) tablet 6.25 mg  6.25 mg Oral BID WC Hillary Bow, MD   6.25 mg at 12/14/17 1000  . chlorhexidine (PERIDEX) 0.12 % solution 15 mL  15 mL Mouth Rinse BID Lafayette Dragon, MD   15 mL at 12/14/17 1000  . docusate sodium (COLACE) capsule 100 mg  100 mg Oral BID Hillary Bow, MD   100 mg at 12/14/17 1000  . escitalopram (LEXAPRO) tablet 10 mg  10 mg Oral Daily Hillary Bow, MD   10 mg at 12/14/17 1000  . feeding supplement (ENSURE ENLIVE)  (ENSURE ENLIVE) liquid 237 mL  237 mL Oral BID BM Lafayette Dragon, MD   237 mL at 12/13/17 1548  . [START ON 12-30-202019] furosemide (LASIX) tablet 40 mg  40 mg Oral Daily Gladstone Lighter, MD      . gabapentin (NEURONTIN) capsule 100 mg  100 mg Oral QHS Tukov-Yual, Magdalene S, NP   100 mg at 12/13/17 2053  . insulin aspart (novoLOG) injection 0-15 Units  0-15 Units Subcutaneous Q4H Hillary Bow, MD   5 Units at 12/14/17 1251  . ipratropium-albuterol (DUONEB) 0.5-2.5 (3) MG/3ML nebulizer solution 3 mL  3 mL Nebulization TID Gladstone Lighter, MD   3 mL at 12/14/17 1333  . magnesium oxide (MAG-OX) tablet 400 mg  400 mg Oral Daily Hillary Bow, MD   400 mg at 12/14/17 1000  . MEDLINE mouth rinse  15 mL Mouth Rinse q12n4p Lafayette Dragon, MD   15 mL at 12/14/17 1310  . Melatonin TABS 2.5 mg  2.5 mg Oral QHS Hillary Bow, MD   2.5 mg at 12/13/17 2251  . meropenem (MERREM) 1 g in sodium chloride 0.9 % 100 mL IVPB  1 g Intravenous Q12H Kalisetti, Hart [place], MD      . morphine 2 MG/ML injection 1 mg  1 mg Intravenous Q3H PRN Tukov-Yual, Magdalene S, NP      . ondansetron (ZOFRAN) tablet 4 mg  4 mg Oral Q6H PRN Sudini, Alveta Heimlich, MD       Or  . ondansetron (ZOFRAN) injection 4 mg  4 mg Intravenous Q6H PRN Sudini, Srikar, MD      . pantoprazole (PROTONIX) EC tablet 40 mg  40 mg Oral Daily Sudini, Alveta Heimlich, MD   40 mg at 12/14/17 1000  . polyethylene glycol (MIRALAX / GLYCOLAX) packet 17 g  17 g Oral Daily PRN Sudini, Alveta Heimlich, MD      . Rivaroxaban (XARELTO) tablet 15 mg  15 mg Oral Q supper Hillary Bow, MD   15 mg at 12/13/17 1717  . rOPINIRole (REQUIP) tablet 0.5 mg  0.5 mg Oral BID Hillary Bow, MD   0.5 mg at 12/14/17 1003  . sodium chloride flush (NS) 0.9 % injection 3 mL  3 mL Intravenous Q12H Sudini, Srikar, MD   3 mL at 12/14/17 1012  . spironolactone (ALDACTONE) tablet 25 mg  25 mg Oral Daily Hillary Bow, MD   25 mg at 12/14/17 1003     Discharge Medications: Please see discharge  summary for a list of discharge medications.  Relevant Imaging Results:  Relevant Lab Results:   Additional Information SS# [id number]  Zettie Pho, LCSW

## 2017-12-14 NOTE — Clinical Social Work Note (Signed)
Clinical Social Work Assessment  Patient Details  [name] MRN: [id number] Date of Birth: 09/06/1924  Date of referral:  12/14/17               Reason for consult:  Facility Placement                Permission sought to share information with:  Facility Contact Representative Permission granted to share information::  Yes, Verbal Permission Granted  Name::        Agency::  [facility] are SNFs  Relationship::     Contact Information:     Housing/Transportation Living arrangements for the past 2 months:  Single Family Home Source of Information:  Patient, Medical Team, Adult Children, Spouse, Other (Comment Required)(Caregiver) Patient Interpreter Needed:  None Criminal Activity/Legal Involvement Pertinent to Current Situation/Hospitalization:  No - Comment as needed Significant Relationships:  Adult Children, Community Support, Spouse, Other(Comment)(Caregiver) Lives with:  Spouse Do you feel safe going back to the place where you live?  Yes Need for family participation in patient care:  No (Coment)  Care giving concerns:  PT recommendation for SNF   Social Worker assessment / plan:  The CSW met with the patient, her daughter, her spouse, and her CNA/caregiver at bedside to discuss discharge planning. The patient and her family verbalized permission to begin a SNF bed search and named [facility] as the preference because 1) it is closer to their home and 2) in the past, [facility] has allowed the patient's husband to sleep in the ALF side overnight and return home during the day. The patient would prefer to return home, and she has CNA assistance 2-3 hours per day. The patient wants to see how she is doing tomorrow before making a final decision regarding discharge, and her family is in agreement.   The CSW has begun the referral process and will provide bed offers as they become available. The CSW will follow for discharge facilitation.  Employment status:   Retired Insurance information:  Managed Medicare PT Recommendations:  Skilled Nursing Facility Information / Referral to community resources:  Skilled Nursing Facility  Patient/Family's Response to care:  The patient and her family thanked the CSW.  Patient/Family's Understanding of and Emotional Response to Diagnosis, Current Treatment, and Prognosis:  The patient and her family understand the PT recommendation and are on the fence. They would like to see if she improves by tomorrow before making a final decision. However, they are in agreement with beginning the referral process.  Emotional Assessment Appearance:  Appears stated age Attitude/Demeanor/Rapport:  Self-Confident, Gracious Affect (typically observed):  Stable, Pleasant Orientation:  Oriented to Self, Oriented to Place, Oriented to  Time, Oriented to Situation Alcohol / Substance use:  Never Used Psych involvement (Current and /or in the community):  No (Comment)  Discharge Needs  Concerns to be addressed:  Care Coordination, Discharge Planning Concerns Readmission within the last 30 days:  No Current discharge risk:  Physical Impairment Barriers to Discharge:  Continued Medical Work up    M , LCSW 12/14/2017, 2:43 PM  

## 2017-12-14 NOTE — Evaluation (Signed)
Physical Therapy Evaluation Patient Details [name] MRN: [id number] DOB: 12/21/1924 Today's Date: 12/14/2017   History of Present Illness  presented to ER secondary to progressive weakness, SOB; admitted with sepsis, acute hypoxic respiratory failure related to bilat PNA.  Initially requiring BiPAP, now transitioned to 2L supplemental O2 via [place].  Clinical Impression  Upon evaluation, patient alert and oriented; follows commands and demonstrates good effort with all tasks.  Generally weak and deconditioned throughout.  Baseline wound to medial aspect of L foot; must wear shoes at all times with WBing/mobility (has in room).  Demonstrates ability to complete bed mobility with cga/close sup; requires heavy mod assist for sit/stand, basic transfers and very short-distance gait (5') with RW.  Poor standing balance, extensive assist for anterior weight translation and to recover/prevent posterior LOB.  Fatigues quickly.  Very high fall risk without constant hands-on support. Anticipate possible need for home O2 (see qualifying sats below); if so, will need extensive education and training regarding line management to minimize trip hazard/fall risk in home environment. Would benefit from skilled PT to address above deficits and promote optimal return to PLOF;recommend transition to STR upon discharge from acute hospitalization.  SaO2 on room air at rest = 89% SaO2 on room air while ambulating = 88% SaO2 on 2 liters of O2 while ambulating = 91%     Follow Up Recommendations SNF    Equipment Recommendations       Recommendations for Other Services       Precautions / Restrictions Precautions Precautions: Fall Restrictions Weight Bearing Restrictions: No      Mobility  Bed Mobility Overal bed mobility: Modified Independent                Transfers Overall transfer level: Needs assistance Equipment used: Rolling walker (2 wheeled) Transfers: Sit to/from  Stand Sit to Stand: Mod assist         General transfer comment: multiple attempts, extensive assist for lift off and anterior weight translation; poor static balance, constant assist to prevent posterior LOB  Ambulation/Gait Ambulation/Gait assistance: Mod assist Gait Distance (Feet): 5 Feet Assistive device: Rolling walker (2 wheeled)       General Gait Details: choppy, staggered steps; poor L ankle control; poor standing balance and overall activity tolerance. Unable to tolerate distance beyond bed/chair due to fatigue/SOB (sats 91-92% on 2L)  Stairs            Wheelchair Mobility    Modified Rankin (Stroke Patients Only)       Balance Overall balance assessment: Needs assistance Sitting-balance support: No upper extremity supported;Feet supported Sitting balance-Leahy Scale: Good     Standing balance support: Bilateral upper extremity supported Standing balance-Leahy Scale: Poor                               Pertinent Vitals/Pain Pain Assessment: No/denies pain    Home Living Family/patient expects to be discharged to:: Private residence Living Arrangements: Spouse/significant other Available Help at Discharge: Family;Available PRN/intermittently;Personal care attendant(aide 9a-12p and 6p-830p, 7 days/week; assist for household chores, ADLs as needed) Type of Home: House Home Access: Stairs to enter   [employer] of Steps: 1 Home Layout: One level Home Equipment: Walker - 2 wheels;Shower seat Additional Comments: Denies BSC, record states she has a ramp at one entrance    Prior Function           Comments: Assist from aide as needed  for ADLs; RW for ambulation within the home, 4WRW for limited ambulation within the community. Personal care attendent 3 hours in AM, 3 hours in PM for bathing assist, housekeeping. 2L O2 at night.     Hand Dominance        Extremity/Trunk Assessment   Upper Extremity Assessment Upper  Extremity Assessment: Overall WFL for tasks assessed    Lower Extremity Assessment Lower Extremity Assessment: Generalized weakness(generally weak and deconditioned, 3-/5 throughout; L foot with collapsed arch, wound reported, must wear shoes with WBing/mobiltiy)       Communication   Communication: HOH  Cognition Arousal/Alertness: Awake/alert Behavior During Therapy: WFL for tasks assessed/performed Overall Cognitive Status: Within Functional Limits for tasks assessed                                        General Comments      Exercises Other Exercises Other Exercises: Toilet transfer, SPT with RW, mod assist; sit/stand from [facility] with RW, min/mod assist (improved performance with use of armrests); static standing balance for clothing management and hygiene, mod asist with RW; dep of second person for hygiene.   Assessment/Plan    PT Assessment Patient needs continued PT services  PT Problem List Decreased strength;Decreased range of motion;Decreased balance;Decreased activity tolerance;Decreased mobility;Decreased knowledge of use of DME;Decreased safety awareness;Decreased knowledge of precautions;Cardiopulmonary status limiting activity       PT Treatment Interventions DME instruction;Gait training;Stair training;Functional mobility training;Therapeutic activities;Therapeutic exercise;Balance training;Patient/family education    PT Goals (Current goals can be found in the Care Plan section)  Acute Rehab PT Goals Patient Stated Goal: to try to ge tup PT Goal Formulation: With patient Time For Goal Achievement: 12/28/17 Potential to Achieve Goals: Fair    Frequency Min 2X/week   Barriers to discharge Decreased caregiver support      Co-evaluation               AM-PAC PT "6 Clicks" Daily Activity  Outcome Measure Difficulty turning over in bed (including adjusting bedclothes, sheets and blankets)?: A Little Difficulty moving from lying on back  to sitting on the side of the bed? : A Little Difficulty sitting down on and standing up from a chair with arms (e.g., wheelchair, bedside commode, etc,.)?: Unable Help needed moving to and from a bed to chair (including a wheelchair)?: A Lot Help needed walking in hospital room?: A Lot Help needed climbing 3-5 steps with a railing? : Total 6 Click Score: 12    End of Session Equipment Utilized During Treatment: Gait belt;Oxygen Activity Tolerance: Patient limited by fatigue Patient left: in chair;with call bell/phone within reach;with chair alarm set;with nursing/sitter in room Nurse Communication: Mobility status PT Visit Diagnosis: Unsteadiness on feet (R26.81);Muscle weakness (generalized) (M62.81);Difficulty in walking, not elsewhere classified (R26.2)    Time: 0920-0950 PT Time Calculation (min) (ACUTE ONLY): 30 min   Charges:   PT Evaluation $PT Eval Moderate Complexity: 1 Mod PT Treatments $Therapeutic Activity: 8-22 mins   PT G Codes:        Kirbie Stodghill H. Owens Shark, PT, DPT, NCS 12/14/17, 10:12 AM [phone]

## 2017-12-15 ENCOUNTER — Inpatient Hospital Stay: Payer: Self-pay

## 2017-12-15 ENCOUNTER — Inpatient Hospital Stay: Payer: Medicare Other

## 2017-12-15 LAB — GLUCOSE, CAPILLARY
GLUCOSE-CAPILLARY: 78 mg/dL (ref 65–99)
GLUCOSE-CAPILLARY: 135 mg/dL — AB (ref 65–99)
GLUCOSE-CAPILLARY: 216 mg/dL — AB (ref 65–99)
GLUCOSE-CAPILLARY: 194 mg/dL — AB (ref 65–99)
Glucose-Capillary: 155 mg/dL — ABNORMAL HIGH (ref 65–99)

## 2017-12-15 LAB — BASIC METABOLIC PANEL
ANION GAP: 7 (ref 5–15)
BUN: 25 mg/dL — ABNORMAL HIGH (ref 6–20)
CO2: 27 mmol/L (ref 22–32)
CREATININE: 0.75 mg/dL (ref 0.44–1.00)
Calcium: 8.5 mg/dL — ABNORMAL LOW (ref 8.9–10.3)
Chloride: 104 mmol/L (ref 101–111)
GFR calc non Af Amer: >60 mL/min (ref >60)
Glucose, Bld: 83 mg/dL (ref 65–99)
POTASSIUM: 4 mmol/L (ref 3.5–5.1)
SODIUM: 138 mmol/L (ref 135–145)

## 2017-12-15 MED ORDER — MEROPENEM IV (FOR PTA / DISCHARGE USE ONLY): 1.0000 g | Freq: Two times a day (BID) | INTRAVENOUS | 0 refills | Status: DC

## 2017-12-15 MED ORDER — SODIUM CHLORIDE 0.9% FLUSH: 10.0000 mL | INTRAVENOUS | Status: DC | PRN

## 2017-12-15 MED ORDER — BUPIVACAINE HCL 0.25 % IJ SOLN
10.0000 mL | Freq: Once | INTRAMUSCULAR | Status: AC
  Administered 2017-12-15: 1 mL
  Filled 2017-12-15: qty 10

## 2017-12-15 MED ORDER — MEPIVACAINE HCL (PF) 1 % IJ SOLN: 5.0000 mL | Freq: Once | INTRAMUSCULAR | Status: DC

## 2017-12-15 MED ORDER — SODIUM CHLORIDE 0.9 % IV SOLN: 1.0000 g | Freq: Two times a day (BID) | INTRAVENOUS | 0 refills | Status: AC

## 2017-12-15 MED ORDER — ENSURE ENLIVE PO LIQD: 237.0000 mL | Freq: Two times a day (BID) | ORAL | 12 refills | Status: DC

## 2017-12-15 MED ORDER — DIPHENHYDRAMINE HCL 50 MG/ML IJ SOLN
25.0000 mg | Freq: Once | INTRAMUSCULAR | Status: DC | PRN
  Filled 2017-12-15: qty 0.5

## 2017-12-15 MED ORDER — FUROSEMIDE 40 MG PO TABS: 40.0000 mg | ORAL_TABLET | Freq: Every day | ORAL | 0 refills | Status: DC

## 2017-12-15 MED ORDER — MEROPENEM IV (FOR PTA / DISCHARGE USE ONLY): 1.0000 g | Freq: Two times a day (BID) | INTRAVENOUS | 0 refills | Status: AC

## 2017-12-15 MED ORDER — SODIUM CHLORIDE 0.9% FLUSH
10.0000 mL | Freq: Two times a day (BID) | INTRAVENOUS | Status: DC
  Administered 2017-12-15: 10 mL

## 2017-12-15 MED ORDER — LORAZEPAM 2 MG/ML IJ SOLN
0.5000 mg | Freq: Once | INTRAMUSCULAR | Status: AC
  Administered 2017-12-15: 0.5 mg via INTRAVENOUS
  Filled 2017-12-15: qty 1

## 2017-12-15 NOTE — Care Management Important Message (Signed)
Important Message  Patient Details  [name] MRN: [id number] Date of Birth: 09-04-1924   Medicare Important Message Given:  Yes    [name] 12/26/202019, 10:24 AM

## 2017-12-15 NOTE — Progress Notes (Addendum)
Pt A&Ox3 with some forgetfulness. Pt VS stable. PICC placed in Right arm. Flushed and authorized for use via xray and PICC nurse. IVs removed. Report called to [facility] RN. Pt transported via EMS. Husband at bedside.  Kyla Balzarine, LPN

## 2017-12-15 NOTE — Care Management (Signed)
Patient was deliberating on being discharged to [facility] versus potentially going home with services. At this time patient feels as if she would feel safer going to [facility] as she is set to get IV abx for 2 weeks. Patient does not feel she has the adequate support to properly administer these medications. Plans for PICC placement today. RNCM collaborated with [facility] who confirms plans to transition to [facility] today. RNCM to sign off.  Ines Bloomer RN BSN RNCM [phone]

## 2017-12-15 NOTE — Clinical Social Work Note (Signed)
Patient is medically ready for discharge today. CSW notified patient of discharge today to [facility]. Patient is in agreement. CSW notified Liliane Channel at [facility] of patient discharge. RN to call report and call for transport.   [place], [place]

## 2017-12-15 NOTE — Progress Notes (Signed)
Family Meeting Note  Advance Directive:yes  Today a meeting took place with the Patient.  The following clinical team members were present during this meeting:MD  The following were discussed:Patient's diagnosis chronic diastolic heart failure Chronic atrial fibrillation Sepsis due to E. coli pneumonia: , Patient's progosis: Unable to determine and Goals for treatment: DNR  Additional follow-up to be provided: DNR order written and yellow sheet signed and placed in patient's chart  Time spent during discussion: 16 minutes  Jaklyn Alen, MD

## 2017-12-15 NOTE — Progress Notes (Signed)
Peripherally Inserted Central Catheter/Midline Placement  The IV Nurse has discussed with the patient and/or persons authorized to consent for the patient, the purpose of this procedure and the potential benefits and risks involved with this procedure.  The benefits include less needle sticks, lab draws from the catheter, and the patient may be discharged home with the catheter. Risks include, but not limited to, infection, bleeding, blood clot (thrombus formation), and puncture of an artery; nerve damage and irregular heartbeat and possibility to perform a PICC exchange if needed/ordered by physician.  Alternatives to this procedure were also discussed.  Bard Power PICC patient education guide, fact sheet on infection prevention and patient information card has been provided to patient /or left at bedside.    PICC/Midline Placement Documentation        Darlyn Read 29-Aug-202019, 2:23 PM

## 2017-12-15 NOTE — Progress Notes (Signed)
Spoke to Dr. Benjie Karvonen about PICC placement, prolonged IV therapy due to allergies. Will be discharged with PICC.  Kyla Balzarine, LPN

## 2017-12-15 NOTE — Discharge Summary (Addendum)
[facility] [place] [name]    MR#:  [id number]  DATE OF BIRTH:  September 21, 1924  DATE OF ADMISSION:  12/11/2017 ADMITTING PHYSICIAN: Hillary Bow, MD  DATE OF DISCHARGE: December 15, 2017 PRIMARY CARE PHYSICIAN: Tracie Harrier, MD    ADMISSION DIAGNOSIS:  Acute pulmonary edema ([place]) [J81.0] Weakness [R53.1] Community acquired pneumonia of left lung, unspecified part of lung [J18.9]  DISCHARGE DIAGNOSIS:  Active Problems:   Pneumonia   SECONDARY DIAGNOSIS:   Past Medical History:  Diagnosis Date  . Cancer ([place])    skin ca  . CHF (congestive heart failure) ([place])   . Chronic back pain   . COPD (chronic obstructive pulmonary disease) ([place])   . Diabetes mellitus without complication ([place])   . Hypertension   . Poor perfusion of leg     HOSPITAL COURSE:   84 year old female with past medical history significant for chronic respiratory failure secondary to COPD on 2 L home oxygen, CHF, chronic back pain, atrial fibrillation on Xarelto status post pacemaker, diabetes, PAD and hypertension brought to the hospital secondary to fevers.  1.  E coli Sepsis  Due to bilateral pneumonia. Blood cultures were positive for E. coli.  Urine culture was negative.  She was on meropenem.  She has multiple allergies.  The E. coli is sensitive to several oral antibiotics however due to her allergy she will need PICC line placement and meropenem for total of 2 weeks.  Stop date for meropenem is July 6.  Take care per routine.  PICC line should be removed after completion of antibiotics.   2.  Acute on chronic respiratory failure due to pneumonia She will continue O2.  3.    Chronic A. fib-rate controlled.  Status post pacemaker.  She will continue Coreg for heart rate control and Xarelto for stroke prevention.    4.  Chronic diastolic CHF: Patient will continue on oral Lasix. Echocardiogram from January 2019 showing EF of 65 to 70% with LVH.   Has severe tricuspid regurgitation  5.  Diabetes: Patient will continue outpatient medications with ADA diet.    DISCHARGE CONDITIONS AND DIET:   Stable for discharge on heart healthy diet/ADA diet  CONSULTS OBTAINED:  Treatment Team:  Lafayette Dragon, MD Bettey Costa, MD  DRUG ALLERGIES:   Allergies  Allergen Reactions  . Ceftriaxone Swelling  . Cefuroxime Hives  . Cephalosporins Swelling and Other (See Comments)    Reaction:  Fainting/dry mouth   . Codeine Nausea And Vomiting  . Epinephrine Other (See Comments)    Reaction:  Fainting   . Morphine And Related Other (See Comments)    Reaction:  GI upset   . Buprenorphine Hcl Nausea And Vomiting  . Ciprofloxacin Itching, Swelling and Rash  . Latex Rash  . Lidocaine Rash  . Procaine Rash    DISCHARGE MEDICATIONS:   Allergies as of 08-Jan-202019      Reactions   Ceftriaxone Swelling   Cefuroxime Hives   Cephalosporins Swelling, Other (See Comments)   Reaction:  Fainting/dry mouth    Codeine Nausea And Vomiting   Epinephrine Other (See Comments)   Reaction:  Fainting    Morphine And Related Other (See Comments)   Reaction:  GI upset    Buprenorphine Hcl Nausea And Vomiting   Ciprofloxacin Itching, Swelling, Rash   Latex Rash   Lidocaine Rash   Procaine Rash      Medication List    STOP taking these  medications   lactobacillus Pack   loperamide 2 MG capsule Commonly known as:  IMODIUM   oxyCODONE 5 MG immediate release tablet Commonly known as:  Oxy IR/ROXICODONE   potassium chloride SA 20 MEQ tablet Commonly known as:  K-DUR,KLOR-CON     TAKE these medications   acetaminophen 325 MG tablet Commonly known as:  TYLENOL Take 2 tablets (650 mg total) by mouth every 6 (six) hours as needed for mild pain (or Fever >/= 101).   aspirin EC 81 MG tablet Take 81 mg by mouth daily.   baclofen 10 MG tablet Commonly known as:  LIORESAL Take 10 mg by mouth 3 (three) times daily as needed.   buPROPion 150 MG  24 hr tablet Commonly known as:  WELLBUTRIN XL Take 1 tablet by mouth daily.   carvedilol 6.25 MG tablet Commonly known as:  COREG Take 6.25 mg by mouth 2 (two) times daily with a meal.   cholecalciferol 1000 units tablet Commonly known as:  VITAMIN D Take 1,000 Units by mouth daily.   docusate sodium 100 MG capsule Commonly known as:  COLACE Take 100 mg by mouth 2 (two) times daily.   escitalopram 10 MG tablet Commonly known as:  LEXAPRO Take 10 mg by mouth daily.   feeding supplement (ENSURE ENLIVE) Liqd Take 237 mLs by mouth 2 (two) times daily between meals. What changed:  when to take this   ferrous sulfate 325 (65 FE) MG tablet Take 325 mg by mouth daily with breakfast.   furosemide 40 MG tablet Commonly known as:  LASIX Take 1 tablet (40 mg total) by mouth daily. Start taking on:  12/16/2017 What changed:    medication strength  how much to take  additional instructions   gabapentin 100 MG capsule Commonly known as:  NEURONTIN Take 100-200 mg by mouth at bedtime.   glimepiride 4 MG tablet Commonly known as:  AMARYL Take 4 mg by mouth 2 (two) times daily.   JANUVIA 100 MG tablet Generic drug:  sitaGLIPtin Take 100 mg by mouth daily.   magnesium oxide 400 MG tablet Commonly known as:  MAG-OX Take 400 mg by mouth daily.   Melatonin 1 MG Tabs Take 1 mg by mouth at bedtime.   meropenem 1 g in sodium chloride 0.9 % 100 mL Inject 1 g into the vein every 12 (twelve) hours for 12 days.   metFORMIN 500 MG tablet Commonly known as:  GLUCOPHAGE Take 1 tablet by mouth daily.   multivitamin tablet Take 1 tablet by mouth daily.   pantoprazole 40 MG tablet Commonly known as:  PROTONIX Take 40 mg by mouth daily.   PROAIR HFA 108 (90 Base) MCG/ACT inhaler Generic drug:  albuterol Inhale 2 puffs into the lungs every 6 (six) hours as needed for wheezing or shortness of breath.   Rivaroxaban 15 MG Tabs tablet Commonly known as:  XARELTO Take 15 mg by  mouth daily.   rOPINIRole 0.5 MG tablet Commonly known as:  REQUIP Take 0.5 mg by mouth 2 (two) times daily.   spironolactone 25 MG tablet Commonly known as:  ALDACTONE Take 25 mg by mouth daily.   vitamin B-12 1000 MCG tablet Commonly known as:  CYANOCOBALAMIN Take 1,000 mcg by mouth daily.   vitamin C 500 MG tablet Commonly known as:  ASCORBIC ACID Take 500 mg by mouth daily.   Vitamin D (Ergocalciferol) 50000 units Caps capsule Commonly known as:  DRISDOL Take 50,000 Units by mouth every 7 (seven) days.  Pt takes on Thursday.         Today   CHIEF COMPLAINT:  No acute events overnight   VITAL SIGNS:  Blood pressure (!) 146/63, pulse 70, temperature (!) 97.4 F (36.3 C), temperature source Oral, resp. rate 18, height 5\' 7"  (1.702 m), weight 80.2 kg (176 lb 11.2 oz), SpO2 93 %.   REVIEW OF SYSTEMS:  Review of Systems  Constitutional: Positive for malaise/fatigue. Negative for chills and fever.  HENT: Negative.  Negative for ear discharge, ear pain, hearing loss, nosebleeds and sore throat.   Eyes: Negative.  Negative for blurred vision and pain.  Respiratory: Positive for cough. Negative for hemoptysis, shortness of breath and wheezing.   Cardiovascular: Negative.  Negative for chest pain, palpitations and leg swelling.  Gastrointestinal: Negative.  Negative for abdominal pain, blood in stool, diarrhea, nausea and vomiting.  Genitourinary: Negative.  Negative for dysuria.  Musculoskeletal: Negative.  Negative for back pain.  Skin: Negative.   Neurological: Negative for dizziness, tremors, speech change, focal weakness, seizures and headaches.  Endo/Heme/Allergies: Negative.  Does not bruise/bleed easily.  Psychiatric/Behavioral: Negative.  Negative for depression, hallucinations and suicidal ideas.     PHYSICAL EXAMINATION:  GENERAL:  84 y.o.-year-old patient lying in the bed with no acute distress.  NECK:  Supple, no jugular venous distention. No thyroid  enlargement, no tenderness.  LUNGS: Normal breath sounds bilaterally, no wheezing, rales,rhonchi  No use of accessory muscles of respiration.  CARDIOVASCULAR: S1, S2 normal. 3/6 murmurs, NOrubs, or gallops.  ABDOMEN: Soft, non-tender, non-distended. Bowel sounds present. No organomegaly or mass.  EXTREMITIES: No pedal edema, cyanosis, or clubbing.  PSYCHIATRIC: The patient is alert and oriented x 3.  SKIN: No obvious rash, lesion, or ulcer.   DATA REVIEW:   CBC Recent Labs  Lab 12/14/17 0405  WBC 8.8  HGB 10.9*  HCT 32.1*  PLT 105*    Chemistries  Recent Labs  Lab 12/11/17 1935  12/13/17 0606  12/15/17 0334  NA  --    < > 139   < > 138  K  --    < > 4.4   < > 4.0  CL  --    < > 109   < > 104  CO2  --    < > 25   < > 27  GLUCOSE  --    < > 122*   < > 83  BUN  --    < > 31*   < > 25*  CREATININE  --    < > 0.87   < > 0.75  CALCIUM  --    < > 8.5*   < > 8.5*  MG  --    < > 2.3  --   --   AST 21  --   --   --   --   ALT 13*  --   --   --   --   ALKPHOS 64  --   --   --   --   BILITOT 0.9  --   --   --   --    < > = values in this interval not displayed.    Cardiac Enzymes Recent Labs  Lab 12/11/17 1944  TROPONINI 0.04*    Microbiology Results  @MICRORSLT48 @  RADIOLOGY:  [place] Ekg Site Rite  Result Date: 02/05/202019 If Site Rite image not attached, placement could not be confirmed due to current cardiac rhythm.     Allergies as  of 2020-04-2518      Reactions   Ceftriaxone Swelling   Cefuroxime Hives   Cephalosporins Swelling, Other (See Comments)   Reaction:  Fainting/dry mouth    Codeine Nausea And Vomiting   Epinephrine Other (See Comments)   Reaction:  Fainting    Morphine And Related Other (See Comments)   Reaction:  GI upset    Buprenorphine Hcl Nausea And Vomiting   Ciprofloxacin Itching, Swelling, Rash   Latex Rash   Lidocaine Rash   Procaine Rash      Medication List    STOP taking these medications   lactobacillus Pack   loperamide 2 MG  capsule Commonly known as:  IMODIUM   oxyCODONE 5 MG immediate release tablet Commonly known as:  Oxy IR/ROXICODONE   potassium chloride SA 20 MEQ tablet Commonly known as:  K-DUR,KLOR-CON     TAKE these medications   acetaminophen 325 MG tablet Commonly known as:  TYLENOL Take 2 tablets (650 mg total) by mouth every 6 (six) hours as needed for mild pain (or Fever >/= 101).   aspirin EC 81 MG tablet Take 81 mg by mouth daily.   baclofen 10 MG tablet Commonly known as:  LIORESAL Take 10 mg by mouth 3 (three) times daily as needed.   buPROPion 150 MG 24 hr tablet Commonly known as:  WELLBUTRIN XL Take 1 tablet by mouth daily.   carvedilol 6.25 MG tablet Commonly known as:  COREG Take 6.25 mg by mouth 2 (two) times daily with a meal.   cholecalciferol 1000 units tablet Commonly known as:  VITAMIN D Take 1,000 Units by mouth daily.   docusate sodium 100 MG capsule Commonly known as:  COLACE Take 100 mg by mouth 2 (two) times daily.   escitalopram 10 MG tablet Commonly known as:  LEXAPRO Take 10 mg by mouth daily.   feeding supplement (ENSURE ENLIVE) Liqd Take 237 mLs by mouth 2 (two) times daily between meals. What changed:  when to take this   ferrous sulfate 325 (65 FE) MG tablet Take 325 mg by mouth daily with breakfast.   furosemide 40 MG tablet Commonly known as:  LASIX Take 1 tablet (40 mg total) by mouth daily. Start taking on:  12/16/2017 What changed:    medication strength  how much to take  additional instructions   gabapentin 100 MG capsule Commonly known as:  NEURONTIN Take 100-200 mg by mouth at bedtime.   glimepiride 4 MG tablet Commonly known as:  AMARYL Take 4 mg by mouth 2 (two) times daily.   JANUVIA 100 MG tablet Generic drug:  sitaGLIPtin Take 100 mg by mouth daily.   magnesium oxide 400 MG tablet Commonly known as:  MAG-OX Take 400 mg by mouth daily.   Melatonin 1 MG Tabs Take 1 mg by mouth at bedtime.   meropenem 1 g in  sodium chloride 0.9 % 100 mL Inject 1 g into the vein every 12 (twelve) hours for 12 days.   metFORMIN 500 MG tablet Commonly known as:  GLUCOPHAGE Take 1 tablet by mouth daily.   multivitamin tablet Take 1 tablet by mouth daily.   pantoprazole 40 MG tablet Commonly known as:  PROTONIX Take 40 mg by mouth daily.   PROAIR HFA 108 (90 Base) MCG/ACT inhaler Generic drug:  albuterol Inhale 2 puffs into the lungs every 6 (six) hours as needed for wheezing or shortness of breath.   Rivaroxaban 15 MG Tabs tablet Commonly known as:  XARELTO Take 15  mg by mouth daily.   rOPINIRole 0.5 MG tablet Commonly known as:  REQUIP Take 0.5 mg by mouth 2 (two) times daily.   spironolactone 25 MG tablet Commonly known as:  ALDACTONE Take 25 mg by mouth daily.   vitamin B-12 1000 MCG tablet Commonly known as:  CYANOCOBALAMIN Take 1,000 mcg by mouth daily.   vitamin C 500 MG tablet Commonly known as:  ASCORBIC ACID Take 500 mg by mouth daily.   Vitamin D (Ergocalciferol) 50000 units Caps capsule Commonly known as:  DRISDOL Take 50,000 Units by mouth every 7 (seven) days. Pt takes on Thursday.        Management plans discussed with the patient and she is in agreement. Stable for discharge   Patient should follow up with pcp  CODE STATUS:     Code Status Orders  (From admission, onward)        Start     Ordered   12/11/17 2059  Do not attempt resuscitation (DNR)  Continuous    Question Answer Comment  In the event of cardiac or respiratory ARREST Do not call a "code blue"   In the event of cardiac or respiratory ARREST Do not perform Intubation, CPR, defibrillation or ACLS   In the event of cardiac or respiratory ARREST Use medication by any route, position, wound care, and other measures to relive pain and suffering. May use oxygen, suction and manual treatment of airway obstruction as needed for comfort.      12/11/17 2059    Code Status History    Date Active Date  Inactive Code Status Order ID Comments User Context   07/15/2017 1830 07/19/2017 1845 DNR [id number]  Gorden Harms, MD Inpatient   04/14/2017 0344 04/18/2017 2138 Full Code [id number]  Lance Coon, MD Inpatient   12/08/2016 0107 12/09/2016 2031 Full Code [id number]  Lance Coon, MD ED   09/07/2015 0441 09/13/2015 1955 Full Code [id number]  Saundra Shelling, MD Inpatient   09/01/2015 1533 09/03/2015 1907 Full Code [id number]  Epifanio Lesches, MD ED   03/21/2015 0626 03/22/2015 1939 Full Code [id number]  Harrie Foreman, MD Inpatient    Advance Directive Documentation     Most Recent Value  Type of Advance Directive  Healthcare Power of [place], Living will, Out of facility DNR (pink MOST or yellow form)  Pre-existing out of facility DNR order (yellow form or pink MOST form)  Yellow form placed in chart (order not valid for inpatient use)  "MOST" Form in Place?  -      TOTAL TIME TAKING CARE OF THIS PATIENT: 38 minutes.    Note: This dictation was prepared with Dragon dictation along with smaller phrase technology. Any transcriptional errors that result from this process are unintentional.  Juliocesar Blasius M.D on 2020-04-1518 at 10:12 AM  Between 7am to 6pm - Pager - [phone] After 6pm go to [email or web address] - password [facility]  Office  [phone]  CC: Primary care physician; Tracie Harrier, MD

## 2017-12-15 NOTE — Progress Notes (Signed)
PHARMACY CONSULT NOTE FOR:  OUTPATIENT  PARENTERAL ANTIBIOTIC THERAPY (OPAT)  Indication: Bacteremia Regimen: Meropenem 1 gram IV q12h End date: December 27, 2017  IV antibiotic discharge orders are pended. To discharging provider:  please sign these orders via discharge navigator,  Select New Orders & click on the button choice - Manage This Unsigned Work.     Thank you for allowing pharmacy to be a part of this patient's care.  [name] A September 24, 202019, 10:30 AM

## 2017-12-15 NOTE — Progress Notes (Signed)
At bedside to place PICC, patient has allergy to lidocaine but does not have issue with carbocaine or prilocaine without epi.  Dr. Benjie Karvonen notified by Maudie Mercury, RN and orders to be placed.  Awaiting medication before able to start procedure.  Carolee Rota, RN VAST

## 2018-01-07 ENCOUNTER — Ambulatory Visit: Payer: Medicare Other | Admitting: Family

## 2018-01-10 ENCOUNTER — Emergency Department
Admission: EM | Admit: 2018-01-10 | Discharge: 2018-01-10 | Disposition: A | Discharge status: Home / Self Care | Payer: Medicare Other | Attending: Emergency Medicine | Admitting: Emergency Medicine

## 2018-01-10 ENCOUNTER — Emergency Department: Payer: Medicare Other

## 2018-01-10 ENCOUNTER — Other Ambulatory Visit: Payer: Self-pay

## 2018-01-10 ENCOUNTER — Encounter: Payer: Self-pay | Admitting: Emergency Medicine

## 2018-01-10 DIAGNOSIS — I5043 Acute on chronic combined systolic (congestive) and diastolic (congestive) heart failure: Secondary | ICD-10-CM | POA: Insufficient documentation

## 2018-01-10 DIAGNOSIS — S0990XA Unspecified injury of head, initial encounter: Secondary | ICD-10-CM | POA: Insufficient documentation

## 2018-01-10 DIAGNOSIS — Y939 Activity, unspecified: Secondary | ICD-10-CM | POA: Diagnosis not present

## 2018-01-10 DIAGNOSIS — Y929 Unspecified place or not applicable: Secondary | ICD-10-CM | POA: Diagnosis not present

## 2018-01-10 DIAGNOSIS — Z85828 Personal history of other malignant neoplasm of skin: Secondary | ICD-10-CM | POA: Insufficient documentation

## 2018-01-10 DIAGNOSIS — Z7984 Long term (current) use of oral hypoglycemic drugs: Secondary | ICD-10-CM | POA: Diagnosis not present

## 2018-01-10 DIAGNOSIS — I110 Hypertensive heart disease with heart failure: Secondary | ICD-10-CM | POA: Insufficient documentation

## 2018-01-10 DIAGNOSIS — J449 Chronic obstructive pulmonary disease, unspecified: Secondary | ICD-10-CM | POA: Diagnosis not present

## 2018-01-10 DIAGNOSIS — Z91040 Latex allergy status: Secondary | ICD-10-CM | POA: Diagnosis not present

## 2018-01-10 DIAGNOSIS — Z7982 Long term (current) use of aspirin: Secondary | ICD-10-CM | POA: Diagnosis not present

## 2018-01-10 DIAGNOSIS — E119 Type 2 diabetes mellitus without complications: Secondary | ICD-10-CM | POA: Diagnosis not present

## 2018-01-10 DIAGNOSIS — Z79899 Other long term (current) drug therapy: Secondary | ICD-10-CM | POA: Diagnosis not present

## 2018-01-10 DIAGNOSIS — W109XXA Fall (on) (from) unspecified stairs and steps, initial encounter: Secondary | ICD-10-CM | POA: Insufficient documentation

## 2018-01-10 DIAGNOSIS — Y999 Unspecified external cause status: Secondary | ICD-10-CM | POA: Insufficient documentation

## 2018-01-10 NOTE — Discharge Instructions (Addendum)
Since you are on Plavix, you do have an increased risk to have bleeding in your brain even up to 24 hours after your fall.  If you develop severe headache, vision changes, weakness or numbness, vomiting, or any other new or worsening symptoms that concern you, return to the emergency department immediately.

## 2018-01-10 NOTE — ED Provider Notes (Signed)
[facility] Emergency Department Provider Note ____________________________________________   First MD Initiated Contact with Patient 01/10/18 1506     (approximate)  I have reviewed the triage vital signs and the nursing notes.   HISTORY  Chief Complaint Fall    HPI [name] is a 84 y.o. female with PMH as noted below who presents with an injury, acute onset within the last hour, and occurring mechanically when the patient was moving a chair and it gave way causing her to fall down one step.  She states she hit the back of her head and has a bump there.  She denies LOC, severe headache, vomiting, or any other injuries other than mild pain in her right elbow.  She denies feeling dizzy or near syncopal prior to the fall.   Past Medical History:  Diagnosis Date  . Cancer ([place])    skin ca  . CHF (congestive heart failure) ([place])   . Chronic back pain   . COPD (chronic obstructive pulmonary disease) ([place])   . Diabetes mellitus without complication ([place])   . Hypertension   . Poor perfusion of leg     Patient Active Problem List   Diagnosis Date Noted  . Pneumonia 12/11/2017  . CAP (community acquired pneumonia) 04/14/2017  . COPD (chronic obstructive pulmonary disease) ([place]) 12/07/2016  . UTI (urinary tract infection) 12/07/2016  . Ventral hernia without obstruction or gangrene   . Hyponatremia 09/07/2015  . Acute respiratory failure ([place]) 09/01/2015  . Atrial fibrillation ([place]) 04/10/2015  . Essential hypertension 04/10/2015  . Diabetes ([place]) 04/10/2015  . Acute on chronic combined systolic and diastolic CHF (congestive heart failure) ([place]) 03/21/2015  . Pulmonary nodule 11/10/2014    Past Surgical History:  Procedure Laterality Date  . ABDOMINAL HYSTERECTOMY    . BACK SURGERY     x3  . CORONARY STENT PLACEMENT     unknown location per pt  . FOOT SURGERY    . LEG SURGERY    . PACEMAKER PLACEMENT    . TONSILLECTOMY      Prior  to Admission medications   Medication Sig Start Date End Date Taking? Authorizing Provider  acetaminophen (TYLENOL) 325 MG tablet Take 2 tablets (650 mg total) by mouth every 6 (six) hours as needed for mild pain (or Fever >/= 101). 04/18/17   Nicholes Mango, MD  aspirin EC 81 MG tablet Take 81 mg by mouth daily.    [provider]  baclofen (LIORESAL) 10 MG tablet Take 10 mg by mouth 3 (three) times daily as needed.     [provider]  buPROPion (WELLBUTRIN XL) 150 MG 24 hr tablet Take 1 tablet by mouth daily. 11/28/17   [provider]  carvedilol (COREG) 6.25 MG tablet Take 6.25 mg by mouth 2 (two) times daily with a meal.     [provider]  cholecalciferol (VITAMIN D) 1000 units tablet Take 1,000 Units by mouth daily.    [provider]  docusate sodium (COLACE) 100 MG capsule Take 100 mg by mouth 2 (two) times daily.     [provider]  escitalopram (LEXAPRO) 10 MG tablet Take 10 mg by mouth daily.    [provider]  feeding supplement, ENSURE ENLIVE, (ENSURE ENLIVE) LIQD Take 237 mLs by mouth 2 (two) times daily between meals. 12/15/17   Bettey Costa, MD  ferrous sulfate 325 (65 FE) MG tablet Take 325 mg by mouth daily with breakfast.    [provider]  furosemide (LASIX) 40 MG tablet Take 1 tablet (40 mg total) by mouth daily. 12/16/17   Bettey Costa, MD  gabapentin (NEURONTIN) 100 MG capsule Take 100-200 mg by mouth at bedtime.    [provider]  glimepiride (AMARYL) 4 MG tablet Take 4 mg by mouth 2 (two) times daily. 07/01/17   [provider]  magnesium oxide (MAG-OX) 400 MG tablet Take 400 mg by mouth daily.    [provider]  Melatonin 1 MG TABS Take 1 mg by mouth at bedtime.    [provider]  metFORMIN (GLUCOPHAGE) 500 MG tablet Take 1 tablet by mouth daily. 12/10/17   [provider]  Multiple Vitamin (MULTIVITAMIN) tablet Take 1 tablet by mouth daily.    [provider]  pantoprazole (PROTONIX) 40 MG tablet Take 40 mg by mouth daily.    [provider]  PROAIR HFA 108 [phone] Base) MCG/ACT inhaler Inhale 2 puffs into the lungs every 6 (six) hours as needed for wheezing or shortness of breath.  10/02/17   [provider]  Rivaroxaban (XARELTO) 15 MG TABS tablet Take 15 mg by mouth daily.    [provider]  rOPINIRole (REQUIP) 0.5 MG tablet Take 0.5 mg by mouth 2 (two) times daily.    [provider]  sitaGLIPtin (JANUVIA) 100 MG tablet Take 100 mg by mouth daily. 12/06/16   [provider]  spironolactone (ALDACTONE) 25 MG tablet Take 25 mg by mouth daily.     [provider]  vitamin B-12 (CYANOCOBALAMIN) 1000 MCG tablet Take 1,000 mcg by mouth daily.    [provider]  vitamin C (ASCORBIC ACID) 500 MG tablet Take 500 mg by mouth daily.    [provider]  Vitamin D, Ergocalciferol, (DRISDOL) 50000 UNITS CAPS capsule Take 50,000 Units by mouth every 7 (seven) days. Pt takes on Thursday.    [provider]    Allergies Ceftriaxone; Cefuroxime; Cephalosporins; Codeine; Epinephrine; Morphine and related; Buprenorphine hcl; Ciprofloxacin; Latex; Lidocaine; and Procaine  Family History  Problem Relation Age of Onset  . CVA Mother        Congestive Heart Failure, heart attack, hypertension, stroke  . Diabetes Mellitus II Sister   . Heart disease Father        Congestive Heart Failure, heart attack, hypertension    Social History Social History   Tobacco Use  . Smoking status: Never Smoker  . Smokeless tobacco: Never Used  Substance Use Topics  . Alcohol use: No    Alcohol/week: 0.0 oz  . Drug use: No    Review of Systems  Constitutional: No fever. Eyes: No visual changes. ENT: No neck pain. Cardiovascular: Denies chest pain. Respiratory: Denies shortness of breath. Gastrointestinal: No vomiting. Genitourinary: Negative for flank pain.  Musculoskeletal:  Negative for back pain. Skin: Negative for rash. Neurological: Negative for headache.   ____________________________________________   PHYSICAL EXAM:  VITAL SIGNS: ED Triage Vitals  Enc Vitals Group     BP 01/10/18 1339 (!) 123/54     Pulse Rate 01/10/18 1339 72     Resp 01/10/18 1339 18     Temp 01/10/18 1339 97.7 F (36.5 C)     Temp Source 01/10/18 1339 Oral     SpO2 01/10/18 1339 97 %     Weight 01/10/18 1340 178 lb (80.7 kg)     Height 01/10/18 1340 5\' 7"  (1.702 m)     Head Circumference --  Peak Flow --      Pain Score 01/10/18 1340 4     Pain Loc --      Pain Edu? --      Excl. in [place]? --     Constitutional: Alert and oriented. Well appearing and in no acute distress. Eyes: Conjunctivae are normal.  EOMI.  PERRLA. Head: Atraumatic. Nose: No congestion/rhinnorhea. Mouth/Throat: Mucous membranes are moist.   Neck: Normal range of motion.  No midline cervical spinal tenderness. Cardiovascular: Good peripheral circulation. Respiratory: Normal respiratory effort.   Gastrointestinal:  No distention.  Musculoskeletal: FROM with no deformity to all extremities.  Extremities warm and well perfused.  Neurologic:  Normal speech and language.  Motor and sensory intact in all extremities.  Normal coordination.  No gross focal neurologic deficits are appreciated.  Skin:  Skin is warm and dry. No rash noted. Psychiatric: Mood and affect are normal. Speech and behavior are normal.  ____________________________________________   LABS (all labs ordered are listed, but only abnormal results are displayed)  Labs Reviewed - No data to display ____________________________________________  EKG   ____________________________________________  RADIOLOGY  CT head: No ICH CT cervical spine: No acute fracture  ____________________________________________   PROCEDURES  Procedure(s) performed: No  Procedures  Critical Care performed:  No ____________________________________________   INITIAL IMPRESSION / ASSESSMENT AND PLAN / ED COURSE  Pertinent labs & imaging results that were available during my care of the patient were reviewed by me and considered in my medical decision making (see chart for details).  84 year old female with PMH as noted above and who is on Plavix and Xarelto presents after a head injury resulting from a mechanical fall from standing height.  She had no LOC, and has no severe headache, vomiting, or neurologic symptoms.  On exam, the vital signs are normal, the patient is well-appearing, and exam is unremarkable.  CTs of the head and C-spine show no intracranial hemorrhage and no acute fractures.  The patient is stable for discharge at this time.  I counseled the patient on the risk of delayed bleeding on Plavix and Xarelto, and gave thorough return precautions.  The patient expressed understanding.   ____________________________________________   FINAL CLINICAL IMPRESSION(S) / ED DIAGNOSES  Final diagnoses:  Minor head injury, initial encounter      NEW MEDICATIONS STARTED DURING THIS VISIT:  New Prescriptions   No medications on file     Note:  This document was prepared using Dragon voice recognition software and may include unintentional dictation errors.    Arta Silence, MD 01/10/18 1527

## 2018-01-10 NOTE — ED Triage Notes (Signed)
Tripped and fell within past hour. Hit back of head. Was with caregiver who states no LOC. Small hematoma noted back of head. Patient on plavix and xaralto. States small headache.

## 2018-02-05 LAB — ECHOCARDIOGRAM COMPLETE
Height: 67 in
Weight: 2800 oz

## 2018-02-10 NOTE — Progress Notes (Signed)
[facility] Consultation      Assessment and Plan:  Chronic interstitial lung disease. Chronic cough with hoarseness of voice. -Discussed with patient today that she has chronic lung issues which may be contributing to her hoarseness.  Given her advanced age and comorbidities I would not look into working these up further or consider aggressive interventions.  I provided reassurance that her lungs appear to be stable at this time. - We discussed possible intervention for her hoarseness including adding humidity to her nocturnal oxygen, as well as a trial of anticholinergic nebulizers.  As she is on numerous medications she opted against a nebulizer trial.  I will therefore order humidity to be added to her nocturnal oxygen to see if this helps with her hoarseness.  She is asked to call us back if she would like to reconsider trial of nebulizer or seen for further work-up.  Return if symptoms worsen or fail to improve.   Date: 02/11/2018  MRN# [id number] [name] 04/04/1925    [name] is a 84 y.o. old female seen in consultation for chief complaint of:    Chief Complaint  Patient presents with  . Consult    Referred by Dr. Pryor Ochoa for cough, hoarseness.pt had pneumonia 11/2017 and these sx persist.  . Cough    per patient resolved,   . Shortness of Breath    with exertion; pt denies chest tightness and or wheezing.    HPI:  The patient is is a 84 year old female with a previous diagnosis of COPD, discharged from the hospital on Oct 31, 202019 after 4-day hospital admission E. coli sepsis with pneumonia.  She is referred today by her ENT physician due to cough and hoarseness of breath since a hospital admission in January. She was not intubated during the recent admission but was on a bipap.  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  She was also started on Astelin nasal  spray.  Currently she feels that the cough is better but she continues to remain hoarse. She feels feels that breathing is not bad. She lives in her own home and has a caregiver living with them. She cooks some, she does light cleaning, she does not drive a car.  She denies reflux. Denies sinus drainage. She completed using the astelin, did not feel that it helped with the hoarseness.  She denies trouble breathing, she uses oxygen at night at 2L, there is no humidity.  She has an inhaler at home that she uses occasionally.    **Chest x-ray Oct 31, 202019>> imaging personally reviewed, diffuse bilateral interstitial changes, bibasilar pneumonia.    PMHX:   Past Medical History:  Diagnosis Date  . Cancer ([place])    skin ca  . CHF (congestive heart failure) ([place])   . Chronic back pain   . COPD (chronic obstructive pulmonary disease) ([place])   . Diabetes mellitus without complication ([place])   . Hypertension   . Poor perfusion of leg    Surgical Hx:  Past Surgical History:  Procedure Laterality Date  . ABDOMINAL HYSTERECTOMY    . BACK SURGERY     x3  . CORONARY STENT PLACEMENT     unknown location per pt  . FOOT SURGERY    . LEG SURGERY    . PACEMAKER PLACEMENT    . TONSILLECTOMY     Family Hx:  Family History  Problem Relation Age of Onset  . CVA  Mother        Congestive Heart Failure, heart attack, hypertension, stroke  . Diabetes Mellitus II Sister   . Heart disease Father        Congestive Heart Failure, heart attack, hypertension   Social Hx:   Social History   Tobacco Use  . Smoking status: Never Smoker  . Smokeless tobacco: Never Used  Substance Use Topics  . Alcohol use: No    Alcohol/week: 0.0 standard drinks  . Drug use: No   Medication:    Current Outpatient Medications:  .  acetaminophen (TYLENOL) 325 MG tablet, Take 2 tablets (650 mg total) by mouth every 6 (six) hours as needed for mild pain (or Fever >/= 101)., Disp: , Rfl:  .  aspirin EC 81 MG tablet,  Take 81 mg by mouth daily., Disp: , Rfl:  .  baclofen (LIORESAL) 10 MG tablet, Take 10 mg by mouth 3 (three) times daily as needed. , Disp: , Rfl:  .  buPROPion (WELLBUTRIN XL) 150 MG 24 hr tablet, Take 1 tablet by mouth daily., Disp: , Rfl:  .  carvedilol (COREG) 6.25 MG tablet, Take 6.25 mg by mouth 2 (two) times daily with a meal. , Disp: , Rfl:  .  cholecalciferol (VITAMIN D) 1000 units tablet, Take 1,000 Units by mouth daily., Disp: , Rfl:  .  docusate sodium (COLACE) 100 MG capsule, Take 100 mg by mouth 2 (two) times daily. , Disp: , Rfl:  .  escitalopram (LEXAPRO) 10 MG tablet, Take 10 mg by mouth daily., Disp: , Rfl:  .  feeding supplement, ENSURE ENLIVE, (ENSURE ENLIVE) LIQD, Take 237 mLs by mouth 2 (two) times daily between meals., Disp: 237 mL, Rfl: 12 .  ferrous sulfate 325 (65 FE) MG tablet, Take 325 mg by mouth daily with breakfast., Disp: , Rfl:  .  furosemide (LASIX) 40 MG tablet, Take 1 tablet (40 mg total) by mouth daily., Disp: 30 tablet, Rfl: 0 .  gabapentin (NEURONTIN) 100 MG capsule, Take 100-200 mg by mouth at bedtime., Disp: , Rfl:  .  glimepiride (AMARYL) 4 MG tablet, Take 4 mg by mouth 2 (two) times daily., Disp: , Rfl:  .  magnesium oxide (MAG-OX) 400 MG tablet, Take 400 mg by mouth daily., Disp: , Rfl:  .  Melatonin 1 MG TABS, Take 1 mg by mouth at bedtime., Disp: , Rfl:  .  metFORMIN (GLUCOPHAGE) 500 MG tablet, Take 1 tablet by mouth daily., Disp: , Rfl:  .  Multiple Vitamin (MULTIVITAMIN) tablet, Take 1 tablet by mouth daily., Disp: , Rfl:  .  oxycodone (OXY-IR) 5 MG capsule, Take 5 mg by mouth every 8 (eight) hours as needed., Disp: , Rfl:  .  pantoprazole (PROTONIX) 40 MG tablet, Take 40 mg by mouth daily., Disp: , Rfl:  .  PROAIR HFA 108 (90 Base) MCG/ACT inhaler, Inhale 2 puffs into the lungs every 6 (six) hours as needed for wheezing or shortness of breath. , Disp: , Rfl:  .  Rivaroxaban (XARELTO) 15 MG TABS tablet, Take 15 mg by mouth daily., Disp: , Rfl:  .   rOPINIRole (REQUIP) 0.5 MG tablet, Take 0.5 mg by mouth 2 (two) times daily., Disp: , Rfl:  .  sitaGLIPtin (JANUVIA) 100 MG tablet, Take 100 mg by mouth daily., Disp: , Rfl:  .  spironolactone (ALDACTONE) 25 MG tablet, Take 25 mg by mouth daily. , Disp: , Rfl:  .  vitamin B-12 (CYANOCOBALAMIN) 1000 MCG tablet, Take 1,000 mcg by mouth  daily., Disp: , Rfl:  .  vitamin C (ASCORBIC ACID) 500 MG tablet, Take 500 mg by mouth daily., Disp: , Rfl:  .  Vitamin D, Ergocalciferol, (DRISDOL) 50000 UNITS CAPS capsule, Take 50,000 Units by mouth every 7 (seven) days. Pt takes on Thursday., Disp: , Rfl:    Allergies:  Ceftriaxone; Cefuroxime; Cephalosporins; Codeine; Epinephrine; Morphine and related; Buprenorphine hcl; Ciprofloxacin; Latex; Lidocaine; and Procaine  Review of Systems: Gen:  Denies  fever, sweats, chills HEENT: Denies blurred vision, double vision. bleeds, sore throat Cvc:  No dizziness, chest pain. Resp:   Denies cough or sputum production, shortness of breath Gi: Denies swallowing difficulty, stomach pain. Gu:  Denies bladder incontinence, burning urine Ext:   No Joint pain, stiffness. Skin: No skin rash,  hives  Endoc:  No polyuria, polydipsia. Psych: No depression, insomnia. Other:  All other systems were reviewed with the patient and were negative other that what is mentioned in the HPI.   Physical Examination:   VS: BP 140/60 (BP Location: Left Arm, Cuff Size: Normal)   Pulse 83   Resp 16   Ht 5\' 7"  (1.702 m)   Wt 178 lb (80.7 kg)   SpO2 93%   BMI 27.88 kg/m   General Appearance: No distress  Neuro:without focal findings,  speech normal,  HEENT: PERRLA, EOM intact.  Mild hoarseness Pulmonary: Scattered rhonchi. CardiovascularNormal S1,S2.  No m/r/g.   Abdomen: Benign, Soft, non-tender. Renal:  No costovertebral tenderness  GU:  No performed at this time. Endoc: No evident thyromegaly, no signs of acromegaly. Skin:   warm, no rashes, no ecchymosis  Extremities:  normal, no cyanosis, clubbing.  Other findings:    LABORATORY PANEL:   CBC No results for input(s): WBC, HGB, HCT, PLT in the last 168 hours. ------------------------------------------------------------------------------------------------------------------  Chemistries  No results for input(s): NA, K, CL, CO2, GLUCOSE, BUN, CREATININE, CALCIUM, MG, AST, ALT, ALKPHOS, BILITOT in the last 168 hours.  Invalid input(s): GFRCGP ------------------------------------------------------------------------------------------------------------------  Cardiac Enzymes No results for input(s): TROPONINI in the last 168 hours. ------------------------------------------------------------  RADIOLOGY:  No results found.     Thank  you for the consultation and for allowing [facility] to assist in the care of your patient. Our recommendations are noted above.  Please contact us if we can be of further service.   Marda Stalker, M.D., F.C.C.P.  Board Certified in Internal Medicine, Pulmonary Medicine, [place], and Sleep Medicine.  [facility] Office Number: [phone]   02/11/2018

## 2018-02-11 ENCOUNTER — Ambulatory Visit: Payer: Medicare Other | Admitting: Internal Medicine

## 2018-02-11 ENCOUNTER — Encounter: Payer: Self-pay | Admitting: Internal Medicine

## 2018-02-11 VITALS — BP 140/60 | HR 83 | Resp 16 | Ht 67.0 in | Wt 178.0 lb

## 2018-02-11 DIAGNOSIS — R490 Dysphonia: Secondary | ICD-10-CM | POA: Diagnosis not present

## 2018-02-11 DIAGNOSIS — R0600 Dyspnea, unspecified: Secondary | ICD-10-CM | POA: Diagnosis not present

## 2018-02-11 DIAGNOSIS — J849 Interstitial pulmonary disease, unspecified: Secondary | ICD-10-CM

## 2018-02-11 DIAGNOSIS — R0689 Other abnormalities of breathing: Secondary | ICD-10-CM

## 2018-02-11 NOTE — Patient Instructions (Signed)
Recommend adding humidity to her night time oxygen.  Let us know if you would like to start a nebulizer and we can prescribe this for you.

## 2018-05-15 ENCOUNTER — Ambulatory Visit (INDEPENDENT_AMBULATORY_CARE_PROVIDER_SITE_OTHER)
Admission: EM | Admit: 2018-05-15 | Discharge: 2018-05-15 | Disposition: A | Discharge status: Home / Self Care | Payer: Medicare Other | Source: Home / Self Care | Attending: Family Medicine | Admitting: Family Medicine

## 2018-05-15 ENCOUNTER — Other Ambulatory Visit: Payer: Self-pay

## 2018-05-15 ENCOUNTER — Ambulatory Visit (INDEPENDENT_AMBULATORY_CARE_PROVIDER_SITE_OTHER): Payer: Medicare Other

## 2018-05-15 DIAGNOSIS — J069 Acute upper respiratory infection, unspecified: Secondary | ICD-10-CM | POA: Diagnosis not present

## 2018-05-15 DIAGNOSIS — R05 Cough: Secondary | ICD-10-CM | POA: Diagnosis not present

## 2018-05-15 DIAGNOSIS — B9789 Other viral agents as the cause of diseases classified elsewhere: Secondary | ICD-10-CM

## 2018-05-15 DIAGNOSIS — N390 Urinary tract infection, site not specified: Secondary | ICD-10-CM | POA: Diagnosis not present

## 2018-05-15 LAB — RAPID INFLUENZA A&B ANTIGENS: Influenza B (ARMC): NEGATIVE

## 2018-05-15 LAB — RAPID INFLUENZA A&B ANTIGENS (ARMC ONLY): INFLUENZA A (ARMC): NEGATIVE

## 2018-05-15 NOTE — Discharge Instructions (Signed)
Continue current antibiotic Increase water intake

## 2018-05-15 NOTE — ED Triage Notes (Signed)
Patient states that she was seen yesterday by [facility] and given Macrobid for UTI. Patient states that she is concerned that she has pneumonia, denies a cough but states that she never has a cough with pneumonia. Reports that she has pneumonia frequently. Reports that she has been having a runny nose and feels worn out.

## 2018-05-15 NOTE — ED Provider Notes (Signed)
[facility]    CSN: [id number] Arrival date & time: 05/15/18  1224     History   Chief Complaint Chief Complaint  Patient presents with  . Fatigue    HPI [name] is a 84 y.o. female.   84 yo female with a c/o fatigue and runny nose since yesterday. Denies any fevers, chills, sore throat, vomiting, diarrhea, cough. States in the past she had pneumonia with only a symptom of fatigue. Also states that 2 days ago she called her primary care provider's office because she was having frequent urination and burning with urination. She had a urine sample collected and was prescribed macrobid which she started yesterday.   The history is provided by the patient.    Past Medical History:  Diagnosis Date  . Cancer ([place])    skin ca  . CHF (congestive heart failure) ([place])   . Chronic back pain   . COPD (chronic obstructive pulmonary disease) ([place])   . Diabetes mellitus without complication ([place])   . Hypertension   . Poor perfusion of leg     Patient Active Problem List   Diagnosis Date Noted  . Pneumonia 12/11/2017  . CAP (community acquired pneumonia) 04/14/2017  . COPD (chronic obstructive pulmonary disease) ([place]) 12/07/2016  . UTI (urinary tract infection) 12/07/2016  . Ventral hernia without obstruction or gangrene   . Hyponatremia 09/07/2015  . Acute respiratory failure ([place]) 09/01/2015  . Atrial fibrillation ([place]) 04/10/2015  . Essential hypertension 04/10/2015  . Diabetes ([place]) 04/10/2015  . Acute on chronic combined systolic and diastolic CHF (congestive heart failure) ([place]) 03/21/2015  . Pulmonary nodule 11/10/2014    Past Surgical History:  Procedure Laterality Date  . ABDOMINAL HYSTERECTOMY    . BACK SURGERY     x3  . CORONARY STENT PLACEMENT     unknown location per pt  . FOOT SURGERY    . LEG SURGERY    . PACEMAKER PLACEMENT    . TONSILLECTOMY      OB History   None      Home Medications    Prior to Admission medications    Medication Sig Start Date End Date Taking? Authorizing Provider  acetaminophen (TYLENOL) 325 MG tablet Take 2 tablets (650 mg total) by mouth every 6 (six) hours as needed for mild pain (or Fever >/= 101). 04/18/17  Yes Gouru, Illene Silver, MD  aspirin EC 81 MG tablet Take 81 mg by mouth daily.   Yes [provider]  baclofen (LIORESAL) 10 MG tablet Take 10 mg by mouth 3 (three) times daily as needed.    Yes [provider]  buPROPion (WELLBUTRIN XL) 150 MG 24 hr tablet Take 1 tablet by mouth daily. 11/28/17  Yes [provider]  carvedilol (COREG) 6.25 MG tablet Take 6.25 mg by mouth 2 (two) times daily with a meal.    Yes [provider]  cholecalciferol (VITAMIN D) 1000 units tablet Take 1,000 Units by mouth daily.   Yes [provider]  docusate sodium (COLACE) 100 MG capsule Take 100 mg by mouth 2 (two) times daily.    Yes [provider]  escitalopram (LEXAPRO) 10 MG tablet Take 10 mg by mouth daily.   Yes [provider]  feeding supplement, ENSURE ENLIVE, (ENSURE ENLIVE) LIQD Take 237 mLs by mouth 2 (two) times daily between meals. 12/15/17  Yes Mody, Ulice Bold, MD  ferrous sulfate 325 (65 FE) MG tablet Take 325 mg by mouth daily with breakfast.  Yes [provider]  furosemide (LASIX) 40 MG tablet Take 1 tablet (40 mg total) by mouth daily. 12/16/17  Yes Mody, Ulice Bold, MD  gabapentin (NEURONTIN) 100 MG capsule Take 100-200 mg by mouth at bedtime.   Yes [provider]  glimepiride (AMARYL) 4 MG tablet Take 4 mg by mouth 2 (two) times daily. 07/01/17  Yes [provider]  magnesium oxide (MAG-OX) 400 MG tablet Take 400 mg by mouth daily.   Yes [provider]  Melatonin 1 MG TABS Take 1 mg by mouth at bedtime.   Yes [provider]  metFORMIN (GLUCOPHAGE) 500 MG tablet Take 1 tablet by mouth daily. 12/10/17  Yes [provider]  Multiple Vitamin (MULTIVITAMIN) tablet Take 1 tablet by mouth  daily.   Yes [provider]  nitrofurantoin, macrocrystal-monohydrate, (MACROBID) 100 MG capsule Take by mouth. 05/13/18 05/20/18 Yes [provider]  oxycodone (OXY-IR) 5 MG capsule Take 5 mg by mouth every 8 (eight) hours as needed.   Yes [provider]  pantoprazole (PROTONIX) 40 MG tablet Take 40 mg by mouth daily.   Yes [provider]  PROAIR HFA 108 (90 Base) MCG/ACT inhaler Inhale 2 puffs into the lungs every 6 (six) hours as needed for wheezing or shortness of breath.  10/02/17  Yes [provider]  Rivaroxaban (XARELTO) 15 MG TABS tablet Take 15 mg by mouth daily.   Yes [provider]  rOPINIRole (REQUIP) 0.5 MG tablet Take 0.5 mg by mouth 2 (two) times daily.   Yes [provider]  sitaGLIPtin (JANUVIA) 100 MG tablet Take 100 mg by mouth daily. 12/06/16  Yes [provider]  spironolactone (ALDACTONE) 25 MG tablet Take 25 mg by mouth daily.    Yes [provider]  vitamin B-12 (CYANOCOBALAMIN) 1000 MCG tablet Take 1,000 mcg by mouth daily.   Yes [provider]  vitamin C (ASCORBIC ACID) 500 MG tablet Take 500 mg by mouth daily.   Yes [provider]  Vitamin D, Ergocalciferol, (DRISDOL) 50000 UNITS CAPS capsule Take 50,000 Units by mouth every 7 (seven) days. Pt takes on Thursday.   Yes [provider]    Family History Family History  Problem Relation Age of Onset  . CVA Mother        Congestive Heart Failure, heart attack, hypertension, stroke  . Diabetes Mellitus II Sister   . Heart disease Father        Congestive Heart Failure, heart attack, hypertension    Social History Social History   Tobacco Use  . Smoking status: Never Smoker  . Smokeless tobacco: Never Used  Substance Use Topics  . Alcohol use: No    Alcohol/week: 0.0 standard drinks  . Drug use: No     Allergies   Ceftriaxone; Cefuroxime; Cephalosporins; Codeine; Epinephrine; Morphine and related;  Buprenorphine hcl; Ciprofloxacin; Latex; Lidocaine; and Procaine   Review of Systems Review of Systems   Physical Exam Triage Vital Signs ED Triage Vitals [05/15/18 1246]  Enc Vitals Group     BP 136/64     Pulse Rate 76     Resp 18     Temp 98.3 F (36.8 C)     Temp Source Oral     SpO2 93 %     Weight      Height      Head Circumference      Peak Flow      Pain Score 7     Pain Loc  Pain Edu?      Excl. in [place]?    No data found.  Updated Vital Signs BP 136/64 (BP Location: Left Arm)   Pulse 76   Temp 98.3 F (36.8 C) (Oral)   Resp 18   SpO2 93%   Visual Acuity Right Eye Distance:   Left Eye Distance:   Bilateral Distance:    Right Eye Near:   Left Eye Near:    Bilateral Near:     Physical Exam  Constitutional: She appears well-developed and well-nourished.  Non-toxic appearance. She does not have a sickly appearance. No distress.  HENT:  Nose: Rhinorrhea present.  Cardiovascular: Normal rate, regular rhythm, normal heart sounds and intact distal pulses.  Pulmonary/Chest: Effort normal and breath sounds normal. No stridor. No respiratory distress. She has no wheezes. She has no rales.  Abdominal: Soft. Bowel sounds are normal. She exhibits no distension. There is no tenderness.  Skin: She is not diaphoretic.  Nursing note and vitals reviewed.    UC Treatments / Results  Labs (all labs ordered are listed, but only abnormal results are displayed) Labs Reviewed  RAPID INFLUENZA A&B ANTIGENS ([place])    EKG None  Radiology Dg Chest 2 View  Result Date: 05/15/2018 CLINICAL DATA:  Cough EXAM: CHEST - 2 VIEW COMPARISON:  Aug 22, 202019 FINDINGS: The lungs are hyperinflated likely secondary to COPD. There is no focal parenchymal opacity. There is no pleural effusion or pneumothorax. The heart and mediastinal contours are unremarkable. There is a dual lead cardiac pacemaker. There is partially visualized posterior thoracolumbar spinal fusion.  IMPRESSION: No active cardiopulmonary disease. Electronically Signed   By: Kathreen Devoid   On: 05/15/2018 13:15    Procedures Procedures (including critical care time)  Medications Ordered in UC Medications - No data to display  Initial Impression / Assessment and Plan / UC Course  I have reviewed the triage vital signs and the nursing notes.  Pertinent labs & imaging results that were available during my care of the patient were reviewed by me and considered in my medical decision making (see chart for details).      Final Clinical Impressions(s) / UC Diagnoses   Final diagnoses:  Viral URI  Urinary tract infection without hematuria, site unspecified      Discharge Instructions     Continue current antibiotic Increase water intake    ED Prescriptions    None     1. x-ray results and diagnosis reviewed with patient, husband and caretaker 2. Recommend supportive treatment as above  3. Follow-up prn if symptoms worsen or don't improve   Controlled Substance Prescriptions [place] Controlled Substance Registry consulted? Not Applicable   Norval Gable, MD 05/15/18 1555

## 2018-05-16 ENCOUNTER — Other Ambulatory Visit: Payer: Self-pay

## 2018-05-16 ENCOUNTER — Inpatient Hospital Stay
Admission: EM | Admit: 2018-05-16 | Discharge: 2018-05-18 | DRG: 682 | Disposition: A | Discharge status: Home Health | Payer: Medicare Other | Attending: Family Medicine | Admitting: Family Medicine

## 2018-05-16 ENCOUNTER — Emergency Department: Payer: Medicare Other

## 2018-05-16 ENCOUNTER — Encounter: Payer: Self-pay | Admitting: Emergency Medicine

## 2018-05-16 DIAGNOSIS — Z7901 Long term (current) use of anticoagulants: Secondary | ICD-10-CM

## 2018-05-16 DIAGNOSIS — J449 Chronic obstructive pulmonary disease, unspecified: Secondary | ICD-10-CM | POA: Diagnosis present

## 2018-05-16 DIAGNOSIS — Z91040 Latex allergy status: Secondary | ICD-10-CM

## 2018-05-16 DIAGNOSIS — Z79899 Other long term (current) drug therapy: Secondary | ICD-10-CM

## 2018-05-16 DIAGNOSIS — Z881 Allergy status to other antibiotic agents status: Secondary | ICD-10-CM

## 2018-05-16 DIAGNOSIS — Z885 Allergy status to narcotic agent status: Secondary | ICD-10-CM

## 2018-05-16 DIAGNOSIS — M549 Dorsalgia, unspecified: Secondary | ICD-10-CM | POA: Diagnosis present

## 2018-05-16 DIAGNOSIS — Z8249 Family history of ischemic heart disease and other diseases of the circulatory system: Secondary | ICD-10-CM

## 2018-05-16 DIAGNOSIS — R739 Hyperglycemia, unspecified: Secondary | ICD-10-CM | POA: Diagnosis not present

## 2018-05-16 DIAGNOSIS — Z833 Family history of diabetes mellitus: Secondary | ICD-10-CM

## 2018-05-16 DIAGNOSIS — E1165 Type 2 diabetes mellitus with hyperglycemia: Secondary | ICD-10-CM | POA: Diagnosis present

## 2018-05-16 DIAGNOSIS — Z955 Presence of coronary angioplasty implant and graft: Secondary | ICD-10-CM

## 2018-05-16 DIAGNOSIS — Z888 Allergy status to other drugs, medicaments and biological substances status: Secondary | ICD-10-CM

## 2018-05-16 DIAGNOSIS — I110 Hypertensive heart disease with heart failure: Secondary | ICD-10-CM | POA: Diagnosis present

## 2018-05-16 DIAGNOSIS — Z7982 Long term (current) use of aspirin: Secondary | ICD-10-CM

## 2018-05-16 DIAGNOSIS — Z7984 Long term (current) use of oral hypoglycemic drugs: Secondary | ICD-10-CM

## 2018-05-16 DIAGNOSIS — E860 Dehydration: Secondary | ICD-10-CM | POA: Diagnosis present

## 2018-05-16 DIAGNOSIS — I509 Heart failure, unspecified: Secondary | ICD-10-CM | POA: Diagnosis present

## 2018-05-16 DIAGNOSIS — R339 Retention of urine, unspecified: Secondary | ICD-10-CM | POA: Diagnosis present

## 2018-05-16 DIAGNOSIS — G9341 Metabolic encephalopathy: Secondary | ICD-10-CM | POA: Diagnosis present

## 2018-05-16 DIAGNOSIS — N179 Acute kidney failure, unspecified: Secondary | ICD-10-CM | POA: Diagnosis not present

## 2018-05-16 DIAGNOSIS — G8929 Other chronic pain: Secondary | ICD-10-CM | POA: Diagnosis present

## 2018-05-16 DIAGNOSIS — Z823 Family history of stroke: Secondary | ICD-10-CM

## 2018-05-16 DIAGNOSIS — Z66 Do not resuscitate: Secondary | ICD-10-CM | POA: Diagnosis present

## 2018-05-16 DIAGNOSIS — Z90710 Acquired absence of both cervix and uterus: Secondary | ICD-10-CM

## 2018-05-16 DIAGNOSIS — Z85828 Personal history of other malignant neoplasm of skin: Secondary | ICD-10-CM

## 2018-05-16 DIAGNOSIS — Z950 Presence of cardiac pacemaker: Secondary | ICD-10-CM

## 2018-05-16 LAB — COMPREHENSIVE METABOLIC PANEL
ALBUMIN: 4.1 g/dL (ref 3.5–5.0)
ALT: 30 U/L (ref 0–44)
AST: 41 U/L (ref 15–41)
Alkaline Phosphatase: 80 U/L (ref 38–126)
Anion gap: 11 (ref 5–15)
BUN: 66 mg/dL — ABNORMAL HIGH (ref 8–23)
CHLORIDE: 94 mmol/L — AB (ref 98–111)
CO2: 29 mmol/L (ref 22–32)
CREATININE: 2.13 mg/dL — AB (ref 0.44–1.00)
Calcium: 8.9 mg/dL (ref 8.9–10.3)
GFR calc Af Amer: 22 mL/min — ABNORMAL LOW (ref >60)
GFR calc non Af Amer: 19 mL/min — ABNORMAL LOW (ref >60)
GLUCOSE: 339 mg/dL — AB (ref 70–99)
Potassium: 4.7 mmol/L (ref 3.5–5.1)
SODIUM: 134 mmol/L — AB (ref 135–145)
Total Bilirubin: 0.7 mg/dL (ref 0.3–1.2)
Total Protein: 7.3 g/dL (ref 6.5–8.1)

## 2018-05-16 LAB — URINALYSIS, COMPLETE (UACMP) WITH MICROSCOPIC
BACTERIA UA: NONE SEEN
Bilirubin Urine: NEGATIVE
Glucose, UA: 50 mg/dL — AB
HGB URINE DIPSTICK: NEGATIVE
Ketones, ur: NEGATIVE mg/dL
Leukocytes, UA: NEGATIVE
NITRITE: NEGATIVE
PROTEIN: NEGATIVE mg/dL
SQUAMOUS EPITHELIAL / LPF: NONE SEEN (ref 0–5)
Specific Gravity, Urine: 1.014 (ref 1.005–1.030)
pH: 5 (ref 5.0–8.0)

## 2018-05-16 LAB — CBC
HCT: 37 % (ref 36.0–46.0)
Hemoglobin: 11.6 g/dL — ABNORMAL LOW (ref 12.0–15.0)
MCH: 30.8 pg (ref 26.0–34.0)
MCHC: 31.4 g/dL (ref 30.0–36.0)
MCV: 98.1 fL (ref 80.0–100.0)
PLATELETS: 163 10*3/uL (ref 150–400)
RBC: 3.77 MIL/uL — AB (ref 3.87–5.11)
RDW: 14.1 % (ref 11.5–15.5)
WBC: 11.2 10*3/uL — ABNORMAL HIGH (ref 4.0–10.5)
nRBC: 0 % (ref 0.0–0.2)

## 2018-05-16 MED ORDER — SODIUM CHLORIDE 0.9 % IV SOLN: 1000.0000 mL | Freq: Once | INTRAVENOUS | Status: DC

## 2018-05-16 NOTE — ED Provider Notes (Signed)
[facility] Emergency Department Provider Note   ____________________________________________    I have reviewed the triage vital signs and the nursing notes.   HISTORY  Chief Complaint Urinary Retention and Fall     HPI [name] is a 84 y.o. female who presents today with decreased urination.  Patient reports that she had a mild fall this morning climbing into bed, she is reports her legs got tangled up and she sunk to the floor and had difficulty getting up.  She reports that she has been sore from that all day but she presents to the emergency department because she reports that she has had decreased urination even though she is trying to stay hydrated.  Recently was treated for a UTI with Macrobid because of some dysuria.  No reports of fevers or chills.  No abdominal pain.  No chest pain.  No new back pain, patient does have chronic back pain.   Past Medical History:  Diagnosis Date  . Cancer ([place])    skin ca  . CHF (congestive heart failure) ([place])   . Chronic back pain   . COPD (chronic obstructive pulmonary disease) ([place])   . Diabetes mellitus without complication ([place])   . Hypertension   . Poor perfusion of leg     Patient Active Problem List   Diagnosis Date Noted  . Pneumonia 12/11/2017  . CAP (community acquired pneumonia) 04/14/2017  . COPD (chronic obstructive pulmonary disease) ([place]) 12/07/2016  . UTI (urinary tract infection) 12/07/2016  . Ventral hernia without obstruction or gangrene   . Hyponatremia 09/07/2015  . Acute respiratory failure ([place]) 09/01/2015  . Atrial fibrillation ([place]) 04/10/2015  . Essential hypertension 04/10/2015  . Diabetes ([place]) 04/10/2015  . Acute on chronic combined systolic and diastolic CHF (congestive heart failure) ([place]) 03/21/2015  . Pulmonary nodule 11/10/2014    Past Surgical History:  Procedure Laterality Date  . ABDOMINAL HYSTERECTOMY    . BACK SURGERY     x3  . CORONARY STENT  PLACEMENT     unknown location per pt  . FOOT SURGERY    . LEG SURGERY    . PACEMAKER PLACEMENT    . TONSILLECTOMY      Prior to Admission medications   Medication Sig Start Date End Date Taking? Authorizing Provider  acetaminophen (TYLENOL) 325 MG tablet Take 2 tablets (650 mg total) by mouth every 6 (six) hours as needed for mild pain (or Fever >/= 101). 04/18/17  Yes Gouru, Illene Silver, MD  aspirin EC 81 MG tablet Take 81 mg by mouth daily.   Yes [provider]  baclofen (LIORESAL) 10 MG tablet Take 10 mg by mouth 3 (three) times daily as needed.    Yes [provider]  buPROPion (WELLBUTRIN XL) 150 MG 24 hr tablet Take 1 tablet by mouth daily. 11/28/17  Yes [provider]  carvedilol (COREG) 6.25 MG tablet Take 6.25 mg by mouth 2 (two) times daily with a meal.    Yes [provider]  cholecalciferol (VITAMIN D) 1000 units tablet Take 1,000 Units by mouth daily.   Yes [provider]  docusate sodium (COLACE) 100 MG capsule Take 100 mg by mouth 2 (two) times daily.    Yes [provider]  escitalopram (LEXAPRO) 10 MG tablet Take 10 mg by mouth daily.   Yes [provider]  feeding supplement, ENSURE ENLIVE, (ENSURE ENLIVE) LIQD Take 237 mLs by mouth 2 (two) times daily between meals. 12/15/17  Yes Bettey Costa, MD  ferrous sulfate 325 (65 FE) MG tablet Take 325 mg by mouth daily with breakfast.   Yes [provider]  furosemide (LASIX) 40 MG tablet Take 1 tablet (40 mg total) by mouth daily. 12/16/17  Yes Mody, Ulice Bold, MD  gabapentin (NEURONTIN) 100 MG capsule Take 100-200 mg by mouth at bedtime.   Yes [provider]  glimepiride (AMARYL) 4 MG tablet Take 4 mg by mouth 2 (two) times daily. 07/01/17  Yes [provider]  magnesium oxide (MAG-OX) 400 MG tablet Take 400 mg by mouth daily.   Yes [provider]  Melatonin 1 MG TABS Take 1 mg by mouth at bedtime.   Yes [provider]  metFORMIN  (GLUCOPHAGE) 500 MG tablet Take 1 tablet by mouth daily. 12/10/17  Yes [provider]  Multiple Vitamin (MULTIVITAMIN) tablet Take 1 tablet by mouth daily.   Yes [provider]  nitrofurantoin, macrocrystal-monohydrate, (MACROBID) 100 MG capsule Take 100 mg by mouth 2 (two) times daily.  05/13/18 05/20/18 Yes [provider]  oxycodone (OXY-IR) 5 MG capsule Take 5 mg by mouth every 8 (eight) hours as needed.   Yes [provider]  pantoprazole (PROTONIX) 40 MG tablet Take 40 mg by mouth daily.   Yes [provider]  Rivaroxaban (XARELTO) 15 MG TABS tablet Take 15 mg by mouth daily.   Yes [provider]  rOPINIRole (REQUIP) 0.5 MG tablet Take 0.5 mg by mouth 2 (two) times daily.   Yes [provider]  sitaGLIPtin (JANUVIA) 100 MG tablet Take 100 mg by mouth daily. 12/06/16  Yes [provider]  spironolactone (ALDACTONE) 25 MG tablet Take 25 mg by mouth daily.    Yes [provider]  vitamin B-12 (CYANOCOBALAMIN) 1000 MCG tablet Take 1,000 mcg by mouth daily.   Yes [provider]  vitamin C (ASCORBIC ACID) 500 MG tablet Take 500 mg by mouth daily.   Yes [provider]  Vitamin D, Ergocalciferol, (DRISDOL) 50000 UNITS CAPS capsule Take 50,000 Units by mouth every 7 (seven) days. Pt takes on Thursday.   Yes [provider]  PROAIR HFA 108 (90 Base) MCG/ACT inhaler Inhale 2 puffs into the lungs every 6 (six) hours as needed for wheezing or shortness of breath.  10/02/17   [provider]     Allergies Ceftriaxone; Cefuroxime; Cephalosporins; Codeine; Epinephrine; Morphine and related; Buprenorphine hcl; Ciprofloxacin; Latex; Lidocaine; and Procaine  Family History  Problem Relation Age of Onset  . CVA Mother        Congestive Heart Failure, heart attack, hypertension, stroke  . Diabetes Mellitus II Sister   . Heart disease Father        Congestive Heart Failure, heart attack,  hypertension    Social History Social History   Tobacco Use  . Smoking status: Never Smoker  . Smokeless tobacco: Never Used  Substance Use Topics  . Alcohol use: No    Alcohol/week: 0.0 standard drinks  . Drug use: No    Review of Systems  Constitutional: No fever/chills Eyes: No visual changes.  ENT: No neck pain Cardiovascular: Denies chest pain. Respiratory: Denies shortness of breath. Gastrointestinal: No abdominal pain.  Genitourinary: As above Musculoskeletal: As above Skin: Negative for rash. Neurological: Negative for headaches, no focal weakness   ____________________________________________   PHYSICAL EXAM:  VITAL SIGNS: ED Triage Vitals  Enc Vitals Group     BP 05/16/18 2121 (!) 104/56     Pulse Rate  05/16/18 2121 70     Resp 05/16/18 2121 18     Temp 05/16/18 2121 97.6 F (36.4 C)     Temp Source 05/16/18 2121 Oral     SpO2 05/16/18 2121 (!) 88 %     Weight 05/16/18 2123 79.4 kg (175 lb)     Height 05/16/18 2123 1.702 m (5\' 7" )     Head Circumference --      Peak Flow --      Pain Score 05/16/18 2123 8     Pain Loc --      Pain Edu? --      Excl. in [place]? --     Constitutional: Alert and oriented. No acute distress.   Head: Atraumatic. Nose: No congestion/rhinnorhea. Mouth/Throat: Mucous membranes are moist.    Cardiovascular: Normal rate, regular rhythm. Grossly normal heart sounds.  Good peripheral circulation. Respiratory: Normal respiratory effort.   Gastrointestinal: Soft and nontender. No distention.    Musculoskeletal:Warm and well perfused Neurologic:  Normal speech and language. No gross focal neurologic deficits are appreciated.  Normal strength in both lower extremities, no numbness Skin:  Skin is warm, dry and intact. No rash noted. Psychiatric: Mood and affect are normal. Speech and behavior are normal.  ____________________________________________   LABS (all labs ordered are listed, but only abnormal results are  displayed)  Labs Reviewed  CBC - Abnormal; Notable for the following components:      Result Value   WBC 11.2 (*)    RBC 3.77 (*)    Hemoglobin 11.6 (*)    All other components within normal limits  COMPREHENSIVE METABOLIC PANEL - Abnormal; Notable for the following components:   Sodium 134 (*)    Chloride 94 (*)    Glucose, Bld 339 (*)    BUN 66 (*)    Creatinine, Ser 2.13 (*)    GFR calc non Af Amer 19 (*)    GFR calc Af Amer 22 (*)    All other components within normal limits  URINALYSIS, COMPLETE (UACMP) WITH MICROSCOPIC - Abnormal; Notable for the following components:   Color, Urine YELLOW (*)    APPearance CLEAR (*)    Glucose, UA 50 (*)    All other components within normal limits  URINE CULTURE   ____________________________________________  EKG  ED ECG REPORT I, Lavonia Drafts, the attending physician, personally viewed and interpreted this ECG.  Date: 05/16/2018  Rhythm: Ventricular paced rhythm QRS Axis: normal Intervals: Abnormal ST/T Wave abnormalities: normal   ____________________________________________  RADIOLOGY  Chest x-ray no acute pneumonia ____________________________________________   PROCEDURES  Procedure(s) performed: No  Procedures   Critical Care performed: No ____________________________________________   INITIAL IMPRESSION / ASSESSMENT AND PLAN / ED COURSE  Pertinent labs & imaging results that were available during my care of the patient were reviewed by me and considered in my medical decision making (see chart for details).  Patient presents with decreased urination, possibly related to UTI given recent treatment for such.  Also possible that she has dehydration or acute kidney injury.  Pending labs, urine, chest x-ray  Patient with significant increase in creatinine consistent with acute kidney injury.  IV fluids infusing.  Urinalysis unremarkable, culture sent.  Chest x-ray negative for pneumonia, patient will require  admission for acute kidney injury    ____________________________________________   FINAL CLINICAL IMPRESSION(S) / ED DIAGNOSES  Final diagnoses:  Acute kidney injury ([place])  Hyperglycemia        Note:  This document was prepared  using Systems analyst and may include unintentional dictation errors.    Lavonia Drafts, MD 05/16/18 2249

## 2018-05-16 NOTE — ED Notes (Signed)
Pt placed on O2 at 2l/min via [place].

## 2018-05-16 NOTE — ED Triage Notes (Addendum)
EMS pt to  Rm 4 from home with report of started on Macrobid several days ago for UTI sx. Fell  yesterday. Today not feeling well and unable to urinate. One episode of urine was very dark. Weakness.

## 2018-05-17 ENCOUNTER — Encounter: Payer: Self-pay | Admitting: *Deleted

## 2018-05-17 DIAGNOSIS — Z885 Allergy status to narcotic agent status: Secondary | ICD-10-CM | POA: Diagnosis not present

## 2018-05-17 DIAGNOSIS — Z90710 Acquired absence of both cervix and uterus: Secondary | ICD-10-CM | POA: Diagnosis not present

## 2018-05-17 DIAGNOSIS — Z955 Presence of coronary angioplasty implant and graft: Secondary | ICD-10-CM | POA: Diagnosis not present

## 2018-05-17 DIAGNOSIS — Z8249 Family history of ischemic heart disease and other diseases of the circulatory system: Secondary | ICD-10-CM | POA: Diagnosis not present

## 2018-05-17 DIAGNOSIS — I509 Heart failure, unspecified: Secondary | ICD-10-CM | POA: Diagnosis present

## 2018-05-17 DIAGNOSIS — Z881 Allergy status to other antibiotic agents status: Secondary | ICD-10-CM | POA: Diagnosis not present

## 2018-05-17 DIAGNOSIS — E860 Dehydration: Secondary | ICD-10-CM | POA: Diagnosis present

## 2018-05-17 DIAGNOSIS — G8929 Other chronic pain: Secondary | ICD-10-CM | POA: Diagnosis present

## 2018-05-17 DIAGNOSIS — R339 Retention of urine, unspecified: Secondary | ICD-10-CM | POA: Diagnosis present

## 2018-05-17 DIAGNOSIS — Z7984 Long term (current) use of oral hypoglycemic drugs: Secondary | ICD-10-CM | POA: Diagnosis not present

## 2018-05-17 DIAGNOSIS — Z888 Allergy status to other drugs, medicaments and biological substances status: Secondary | ICD-10-CM | POA: Diagnosis not present

## 2018-05-17 DIAGNOSIS — Z950 Presence of cardiac pacemaker: Secondary | ICD-10-CM | POA: Diagnosis not present

## 2018-05-17 DIAGNOSIS — Z833 Family history of diabetes mellitus: Secondary | ICD-10-CM | POA: Diagnosis not present

## 2018-05-17 DIAGNOSIS — I110 Hypertensive heart disease with heart failure: Secondary | ICD-10-CM | POA: Diagnosis present

## 2018-05-17 DIAGNOSIS — Z7901 Long term (current) use of anticoagulants: Secondary | ICD-10-CM | POA: Diagnosis not present

## 2018-05-17 DIAGNOSIS — Z7982 Long term (current) use of aspirin: Secondary | ICD-10-CM | POA: Diagnosis not present

## 2018-05-17 DIAGNOSIS — G9341 Metabolic encephalopathy: Secondary | ICD-10-CM | POA: Diagnosis present

## 2018-05-17 DIAGNOSIS — Z91040 Latex allergy status: Secondary | ICD-10-CM | POA: Diagnosis not present

## 2018-05-17 DIAGNOSIS — E1165 Type 2 diabetes mellitus with hyperglycemia: Secondary | ICD-10-CM | POA: Diagnosis present

## 2018-05-17 DIAGNOSIS — Z79899 Other long term (current) drug therapy: Secondary | ICD-10-CM | POA: Diagnosis not present

## 2018-05-17 DIAGNOSIS — M549 Dorsalgia, unspecified: Secondary | ICD-10-CM | POA: Diagnosis present

## 2018-05-17 DIAGNOSIS — N179 Acute kidney failure, unspecified: Secondary | ICD-10-CM | POA: Diagnosis present

## 2018-05-17 DIAGNOSIS — J449 Chronic obstructive pulmonary disease, unspecified: Secondary | ICD-10-CM | POA: Diagnosis present

## 2018-05-17 DIAGNOSIS — R739 Hyperglycemia, unspecified: Secondary | ICD-10-CM | POA: Diagnosis present

## 2018-05-17 DIAGNOSIS — Z823 Family history of stroke: Secondary | ICD-10-CM | POA: Diagnosis not present

## 2018-05-17 LAB — CBC
HCT: 33.5 % — ABNORMAL LOW (ref 36.0–46.0)
HEMOGLOBIN: 10.3 g/dL — AB (ref 12.0–15.0)
MCH: 30.1 pg (ref 26.0–34.0)
MCHC: 30.7 g/dL (ref 30.0–36.0)
MCV: 98 fL (ref 80.0–100.0)
PLATELETS: 131 10*3/uL — AB (ref 150–400)
RBC: 3.42 MIL/uL — ABNORMAL LOW (ref 3.87–5.11)
RDW: 13.8 % (ref 11.5–15.5)
WBC: 9.1 10*3/uL (ref 4.0–10.5)
nRBC: 0 % (ref 0.0–0.2)

## 2018-05-17 LAB — BASIC METABOLIC PANEL
Anion gap: 10 (ref 5–15)
BUN: 58 mg/dL — AB (ref 8–23)
CALCIUM: 8.6 mg/dL — AB (ref 8.9–10.3)
CO2: 29 mmol/L (ref 22–32)
Chloride: 101 mmol/L (ref 98–111)
Creatinine, Ser: 1.6 mg/dL — ABNORMAL HIGH (ref 0.44–1.00)
GFR calc Af Amer: 31 mL/min — ABNORMAL LOW (ref >60)
GFR, EST NON AFRICAN AMERICAN: 27 mL/min — AB (ref >60)
Glucose, Bld: 217 mg/dL — ABNORMAL HIGH (ref 70–99)
Potassium: 4.1 mmol/L (ref 3.5–5.1)
SODIUM: 140 mmol/L (ref 135–145)

## 2018-05-17 LAB — GLUCOSE, CAPILLARY
GLUCOSE-CAPILLARY: 211 mg/dL — AB (ref 70–99)
GLUCOSE-CAPILLARY: 194 mg/dL — AB (ref 70–99)
Glucose-Capillary: 155 mg/dL — ABNORMAL HIGH (ref 70–99)
Glucose-Capillary: 159 mg/dL — ABNORMAL HIGH (ref 70–99)

## 2018-05-17 MED ORDER — SODIUM CHLORIDE 0.9 % IV SOLN
INTRAVENOUS | Status: DC
  Administered 2018-05-17: 02:00:00 via INTRAVENOUS

## 2018-05-17 MED ORDER — CARVEDILOL 3.125 MG PO TABS
6.2500 mg | ORAL_TABLET | Freq: Two times a day (BID) | ORAL | Status: DC
  Administered 2018-05-17 – 2018-05-18 (×3): 6.25 mg via ORAL
  Filled 2018-05-17 (×3): qty 2

## 2018-05-17 MED ORDER — VITAMIN D (ERGOCALCIFEROL) 1.25 MG (50000 UNIT) PO CAPS: 50000.0000 [IU] | ORAL_CAPSULE | ORAL | Status: DC

## 2018-05-17 MED ORDER — ADULT MULTIVITAMIN W/MINERALS CH
1.0000 | ORAL_TABLET | Freq: Every day | ORAL | Status: DC
  Administered 2018-05-17 – 2018-05-18 (×2): 1 via ORAL
  Filled 2018-05-17 (×2): qty 1

## 2018-05-17 MED ORDER — ASPIRIN EC 81 MG PO TBEC
81.0000 mg | DELAYED_RELEASE_TABLET | Freq: Every day | ORAL | Status: DC
  Administered 2018-05-17 – 2018-05-18 (×2): 81 mg via ORAL
  Filled 2018-05-17 (×2): qty 1

## 2018-05-17 MED ORDER — VITAMIN B-12 1000 MCG PO TABS
1000.0000 ug | ORAL_TABLET | Freq: Every day | ORAL | Status: DC
  Administered 2018-05-17 – 2018-05-18 (×2): 1000 ug via ORAL
  Filled 2018-05-17 (×2): qty 1

## 2018-05-17 MED ORDER — INSULIN ASPART 100 UNIT/ML ~~LOC~~ SOLN
0.0000 [IU] | Freq: Three times a day (TID) | SUBCUTANEOUS | Status: DC
  Administered 2018-05-17: 2 [IU] via SUBCUTANEOUS
  Administered 2018-05-17: 3 [IU] via SUBCUTANEOUS
  Administered 2018-05-17: 08:00:00 2 [IU] via SUBCUTANEOUS
  Administered 2018-05-18: 1 [IU] via SUBCUTANEOUS
  Administered 2018-05-18: 12:00:00 5 [IU] via SUBCUTANEOUS
  Filled 2018-05-17 (×5): qty 1

## 2018-05-17 MED ORDER — VITAMIN D 25 MCG (1000 UNIT) PO TABS
1000.0000 [IU] | ORAL_TABLET | Freq: Every day | ORAL | Status: DC
  Administered 2018-05-17 – 2018-05-18 (×2): 1000 [IU] via ORAL
  Filled 2018-05-17 (×2): qty 1

## 2018-05-17 MED ORDER — ACETAMINOPHEN 650 MG RE SUPP: 650.0000 mg | Freq: Four times a day (QID) | RECTAL | Status: DC | PRN

## 2018-05-17 MED ORDER — ALBUTEROL SULFATE (2.5 MG/3ML) 0.083% IN NEBU: 2.5000 mg | INHALATION_SOLUTION | Freq: Four times a day (QID) | RESPIRATORY_TRACT | Status: DC | PRN

## 2018-05-17 MED ORDER — ONDANSETRON HCL 4 MG/2ML IJ SOLN: 4.0000 mg | Freq: Four times a day (QID) | INTRAMUSCULAR | Status: DC | PRN

## 2018-05-17 MED ORDER — INSULIN ASPART 100 UNIT/ML ~~LOC~~ SOLN: 0.0000 [IU] | Freq: Every day | SUBCUTANEOUS | Status: DC

## 2018-05-17 MED ORDER — ESCITALOPRAM OXALATE 10 MG PO TABS
10.0000 mg | ORAL_TABLET | Freq: Every day | ORAL | Status: DC
  Administered 2018-05-17 – 2018-05-18 (×2): 10 mg via ORAL
  Filled 2018-05-17 (×2): qty 1

## 2018-05-17 MED ORDER — OXYCODONE HCL 5 MG PO TABS: 5.0000 mg | ORAL_TABLET | Freq: Three times a day (TID) | ORAL | Status: DC | PRN

## 2018-05-17 MED ORDER — HEPARIN SODIUM (PORCINE) 5000 UNIT/ML IJ SOLN: 5000.0000 [IU] | Freq: Three times a day (TID) | INTRAMUSCULAR | Status: DC

## 2018-05-17 MED ORDER — BUPROPION HCL ER (XL) 150 MG PO TB24
150.0000 mg | ORAL_TABLET | Freq: Every day | ORAL | Status: DC
  Administered 2018-05-17 – 2018-05-18 (×2): 150 mg via ORAL
  Filled 2018-05-17 (×2): qty 1

## 2018-05-17 MED ORDER — SPIRONOLACTONE 25 MG PO TABS
25.0000 mg | ORAL_TABLET | Freq: Every day | ORAL | Status: DC
  Administered 2018-05-17 – 2018-05-18 (×2): 25 mg via ORAL
  Filled 2018-05-17 (×2): qty 1

## 2018-05-17 MED ORDER — MELATONIN 5 MG PO TABS
2.5000 mg | ORAL_TABLET | Freq: Every day | ORAL | Status: DC
  Administered 2018-05-17: 21:00:00 2.5 mg via ORAL
  Filled 2018-05-17 (×3): qty 0.5

## 2018-05-17 MED ORDER — ROPINIROLE HCL 1 MG PO TABS
0.5000 mg | ORAL_TABLET | Freq: Two times a day (BID) | ORAL | Status: DC
  Administered 2018-05-17 – 2018-05-18 (×3): 0.5 mg via ORAL
  Filled 2018-05-17 (×3): qty 1

## 2018-05-17 MED ORDER — ACETAMINOPHEN 325 MG PO TABS: 650.0000 mg | ORAL_TABLET | Freq: Four times a day (QID) | ORAL | Status: DC | PRN

## 2018-05-17 MED ORDER — DOCUSATE SODIUM 100 MG PO CAPS
100.0000 mg | ORAL_CAPSULE | Freq: Two times a day (BID) | ORAL | Status: DC
  Administered 2018-05-17 – 2018-05-18 (×3): 100 mg via ORAL
  Filled 2018-05-17 (×3): qty 1

## 2018-05-17 MED ORDER — ENSURE ENLIVE PO LIQD
237.0000 mL | Freq: Two times a day (BID) | ORAL | Status: DC
  Administered 2018-05-17 – 2018-05-18 (×3): 237 mL via ORAL

## 2018-05-17 MED ORDER — FERROUS SULFATE 325 (65 FE) MG PO TABS
325.0000 mg | ORAL_TABLET | Freq: Every day | ORAL | Status: DC
  Administered 2018-05-17 – 2018-05-18 (×2): 325 mg via ORAL
  Filled 2018-05-17 (×2): qty 1

## 2018-05-17 MED ORDER — ONDANSETRON HCL 4 MG PO TABS: 4.0000 mg | ORAL_TABLET | Freq: Four times a day (QID) | ORAL | Status: DC | PRN

## 2018-05-17 MED ORDER — RIVAROXABAN 15 MG PO TABS
15.0000 mg | ORAL_TABLET | Freq: Every day | ORAL | Status: DC
  Administered 2018-05-17: 15 mg via ORAL
  Filled 2018-05-17 (×2): qty 1

## 2018-05-17 MED ORDER — MAGNESIUM OXIDE 400 (241.3 MG) MG PO TABS
400.0000 mg | ORAL_TABLET | Freq: Every day | ORAL | Status: DC
Start: 2018-05-17 — End: 2018-05-18
  Administered 2018-05-17 – 2018-05-18 (×2): 400 mg via ORAL
  Filled 2018-05-17 (×2): qty 1

## 2018-05-17 MED ORDER — GABAPENTIN 100 MG PO CAPS: 100.0000 mg | ORAL_CAPSULE | Freq: Every day | ORAL | Status: DC

## 2018-05-17 MED ORDER — PANTOPRAZOLE SODIUM 40 MG PO TBEC
40.0000 mg | DELAYED_RELEASE_TABLET | Freq: Every day | ORAL | Status: DC
  Administered 2018-05-17 – 2018-05-18 (×2): 40 mg via ORAL
  Filled 2018-05-17 (×2): qty 1

## 2018-05-17 MED ORDER — TRAZODONE HCL 50 MG PO TABS
25.0000 mg | ORAL_TABLET | Freq: Every evening | ORAL | Status: DC | PRN
  Administered 2018-05-17: 25 mg via ORAL
  Filled 2018-05-17: qty 1

## 2018-05-17 MED ORDER — VITAMIN C 500 MG PO TABS
500.0000 mg | ORAL_TABLET | Freq: Every day | ORAL | Status: DC
  Administered 2018-05-17 – 2018-05-18 (×2): 500 mg via ORAL
  Filled 2018-05-17 (×2): qty 1

## 2018-05-17 MED ORDER — BISACODYL 5 MG PO TBEC: 5.0000 mg | DELAYED_RELEASE_TABLET | Freq: Every day | ORAL | Status: DC | PRN

## 2018-05-17 MED ORDER — BACLOFEN 10 MG PO TABS
10.0000 mg | ORAL_TABLET | Freq: Three times a day (TID) | ORAL | Status: DC | PRN
  Filled 2018-05-17: qty 1

## 2018-05-17 MED ORDER — INSULIN GLARGINE 100 UNIT/ML ~~LOC~~ SOLN
10.0000 [IU] | Freq: Every day | SUBCUTANEOUS | Status: DC
  Administered 2018-05-17 – 2018-05-18 (×2): 10 [IU] via SUBCUTANEOUS
  Filled 2018-05-17 (×3): qty 0.1

## 2018-05-17 NOTE — ED Notes (Signed)
Ann RN, aware of bed assigned 

## 2018-05-17 NOTE — H&P (Signed)
[facility] [place] [name]    MR#:  [id number]  DATE OF BIRTH:  08/13/1924  DATE OF ADMISSION:  05/16/2018  PRIMARY CARE PHYSICIAN: Tracie Harrier, MD   REQUESTING/REFERRING PHYSICIAN:   CHIEF COMPLAINT:   Chief Complaint  Patient presents with  . Urinary Retention  . Fall    HISTORY OF PRESENT ILLNESS: [name]  is a 84 y.o. female with a known history of CHF, COPD, diabetes type 2, hypertension. Patient is currently confused, lethargic, unable to provide reliable history.  Information was taken from reviewing electronic medical record and from discussion with emergency room physician and the family. She was brought to emergency room due to worsening generalized weakness, poor urinary output, decreased appetite going on for the past 2 days, gradually getting worse.  Per family, patient was recently treated for UTI, she finished antibiotic course with nitrofurantoin yesterday. No reports of fever, chills, nausea, vomiting, diarrhea, bleeding. Blood test done emergency room are notable for elevated creatinine level at 2.1, elevated BUN at 63, elevated blood sugar at 339.  WBCs 11.2.  UA is negative for UTI. No acute cardiopulmonary abnormalities per chest x-ray. Patient is admitted for further evaluation and treatment.  PAST MEDICAL HISTORY:   Past Medical History:  Diagnosis Date  . Cancer ([place])    skin ca  . CHF (congestive heart failure) ([place])   . Chronic back pain   . COPD (chronic obstructive pulmonary disease) ([place])   . Diabetes mellitus without complication ([place])   . Hypertension   . Poor perfusion of leg     PAST SURGICAL HISTORY:  Past Surgical History:  Procedure Laterality Date  . ABDOMINAL HYSTERECTOMY    . BACK SURGERY     x3  . CORONARY STENT PLACEMENT     unknown location per pt  . FOOT SURGERY    . LEG SURGERY    . PACEMAKER PLACEMENT    . TONSILLECTOMY      SOCIAL HISTORY:   Social History   Tobacco Use  . Smoking status: Never Smoker  . Smokeless tobacco: Never Used  Substance Use Topics  . Alcohol use: No    Alcohol/week: 0.0 standard drinks    FAMILY HISTORY:  Family History  Problem Relation Age of Onset  . CVA Mother        Congestive Heart Failure, heart attack, hypertension, stroke  . Diabetes Mellitus II Sister   . Heart disease Father        Congestive Heart Failure, heart attack, hypertension    DRUG ALLERGIES:  Allergies  Allergen Reactions  . Ceftriaxone Swelling  . Cefuroxime Hives  . Cephalosporins Swelling and Other (See Comments)    Reaction:  Fainting/dry mouth   . Codeine Nausea And Vomiting  . Epinephrine Other (See Comments)    Reaction:  Fainting   . Morphine And Related Other (See Comments)    Reaction:  GI upset   . Buprenorphine Hcl Nausea And Vomiting  . Ciprofloxacin Itching, Swelling and Rash  . Latex Rash  . Lidocaine Rash  . Procaine Rash    REVIEW OF SYSTEMS:   Unable to obtain, due to patient's altered mental status.   MEDICATIONS AT HOME:  Prior to Admission medications   Medication Sig Start Date End Date Taking? Authorizing Provider  acetaminophen (TYLENOL) 325 MG tablet Take 2 tablets (650 mg total) by mouth every 6 (six) hours as needed for mild pain (or Fever >/=  101). 04/18/17  Yes Gouru, Aruna, MD  aspirin EC 81 MG tablet Take 81 mg by mouth daily.   Yes [provider]  baclofen (LIORESAL) 10 MG tablet Take 10 mg by mouth 3 (three) times daily as needed.    Yes [provider]  buPROPion (WELLBUTRIN XL) 150 MG 24 hr tablet Take 1 tablet by mouth daily. 11/28/17  Yes [provider]  carvedilol (COREG) 6.25 MG tablet Take 6.25 mg by mouth 2 (two) times daily with a meal.    Yes [provider]  cholecalciferol (VITAMIN D) 1000 units tablet Take 1,000 Units by mouth daily.   Yes [provider]  docusate sodium (COLACE) 100 MG capsule Take 100 mg by  mouth 2 (two) times daily.    Yes [provider]  escitalopram (LEXAPRO) 10 MG tablet Take 10 mg by mouth daily.   Yes [provider]  feeding supplement, ENSURE ENLIVE, (ENSURE ENLIVE) LIQD Take 237 mLs by mouth 2 (two) times daily between meals. 12/15/17  Yes Mody, Ulice Bold, MD  ferrous sulfate 325 (65 FE) MG tablet Take 325 mg by mouth daily with breakfast.   Yes [provider]  furosemide (LASIX) 40 MG tablet Take 1 tablet (40 mg total) by mouth daily. 12/16/17  Yes Mody, Ulice Bold, MD  gabapentin (NEURONTIN) 100 MG capsule Take 100-200 mg by mouth at bedtime.   Yes [provider]  glimepiride (AMARYL) 4 MG tablet Take 4 mg by mouth 2 (two) times daily. 07/01/17  Yes [provider]  magnesium oxide (MAG-OX) 400 MG tablet Take 400 mg by mouth daily.   Yes [provider]  Melatonin 1 MG TABS Take 1 mg by mouth at bedtime.   Yes [provider]  metFORMIN (GLUCOPHAGE) 500 MG tablet Take 1 tablet by mouth daily. 12/10/17  Yes [provider]  Multiple Vitamin (MULTIVITAMIN) tablet Take 1 tablet by mouth daily.   Yes [provider]  nitrofurantoin, macrocrystal-monohydrate, (MACROBID) 100 MG capsule Take 100 mg by mouth 2 (two) times daily.  05/13/18 05/20/18 Yes [provider]  oxycodone (OXY-IR) 5 MG capsule Take 5 mg by mouth every 8 (eight) hours as needed.   Yes [provider]  pantoprazole (PROTONIX) 40 MG tablet Take 40 mg by mouth daily.   Yes [provider]  Rivaroxaban (XARELTO) 15 MG TABS tablet Take 15 mg by mouth daily.   Yes [provider]  rOPINIRole (REQUIP) 0.5 MG tablet Take 0.5 mg by mouth 2 (two) times daily.   Yes [provider]  sitaGLIPtin (JANUVIA) 100 MG tablet Take 100 mg by mouth daily. 12/06/16  Yes [provider]  spironolactone (ALDACTONE) 25 MG tablet Take 25 mg by mouth daily.    Yes [provider]  vitamin B-12  (CYANOCOBALAMIN) 1000 MCG tablet Take 1,000 mcg by mouth daily.   Yes [provider]  vitamin C (ASCORBIC ACID) 500 MG tablet Take 500 mg by mouth daily.   Yes [provider]  Vitamin D, Ergocalciferol, (DRISDOL) 50000 UNITS CAPS capsule Take 50,000 Units by mouth every 7 (seven) days. Pt takes on Thursday.   Yes [provider]  PROAIR HFA 108 (90 Base) MCG/ACT inhaler Inhale 2 puffs into the lungs every 6 (six) hours as needed for wheezing or shortness of breath.  10/02/17   [provider]      PHYSICAL EXAMINATION:   VITAL SIGNS: Blood pressure (!) 119/55, pulse 74, temperature 97.6 F (36.4 C),  temperature source Oral, resp. rate (!) 21, height 5\' 7"  (1.702 m), weight 79.4 kg, SpO2 94 %.  GENERAL:  84 y.o.-year-old patient lying in the bed, confused, lethargic.  EYES: Pupils equal, round, reactive to light and accommodation. No scleral icterus. Extraocular muscles intact.  HEENT: Head atraumatic, normocephalic. Oropharynx and nasopharynx clear.  NECK:  Supple, no jugular venous distention. No thyroid enlargement, no tenderness.  LUNGS: Normal breath sounds bilaterally, no wheezing, rales,rhonchi or crepitation. No use of accessory muscles of respiration.  CARDIOVASCULAR: S1, S2 normal. No S3/S4.  ABDOMEN: Soft, nontender, nondistended. Bowel sounds present. No organomegaly or mass.  EXTREMITIES: No pedal edema, cyanosis, or clubbing.  NEUROLOGIC EXAM: Is limited, due to patient's altered mental status.  She is moving all her extremities, no focal weakness appreciated. PSYCHIATRIC: The patient is confused, lethargic.  SKIN: No obvious rash, lesion, or ulcer.   LABORATORY PANEL:   CBC Recent Labs  Lab 05/16/18 2125  WBC 11.2*  HGB 11.6*  HCT 37.0  PLT 163  MCV 98.1  MCH 30.8  MCHC 31.4  RDW 14.1   ------------------------------------------------------------------------------------------------------------------  Chemistries  Recent Labs   Lab 05/16/18 2125  NA 134*  K 4.7  CL 94*  CO2 29  GLUCOSE 339*  BUN 66*  CREATININE 2.13*  CALCIUM 8.9  AST 41  ALT 30  ALKPHOS 80  BILITOT 0.7   ------------------------------------------------------------------------------------------------------------------ estimated creatinine clearance is 18.7 mL/min (A) (by C-G formula based on SCr of 2.13 mg/dL (H)). ------------------------------------------------------------------------------------------------------------------ No results for input(s): TSH, T4TOTAL, T3FREE, THYROIDAB in the last 72 hours.  Invalid input(s): FREET3   Coagulation profile No results for input(s): INR, PROTIME in the last 168 hours. ------------------------------------------------------------------------------------------------------------------- No results for input(s): DDIMER in the last 72 hours. -------------------------------------------------------------------------------------------------------------------  Cardiac Enzymes No results for input(s): CKMB, TROPONINI, MYOGLOBIN in the last 168 hours.  Invalid input(s): CK ------------------------------------------------------------------------------------------------------------------ Invalid input(s): POCBNP  ---------------------------------------------------------------------------------------------------------------  Urinalysis    Component Value Date/Time   COLORURINE YELLOW (A) 05/16/2018 2125   APPEARANCEUR CLEAR (A) 05/16/2018 2125   APPEARANCEUR Clear 08/19/2013 1455   LABSPEC 1.014 05/16/2018 2125   LABSPEC 1.019 08/19/2013 1455   PHURINE 5.0 05/16/2018 2125   GLUCOSEU 50 (A) 05/16/2018 2125   GLUCOSEU Negative 08/19/2013 1455   HGBUR NEGATIVE 05/16/2018 2125   BILIRUBINUR NEGATIVE 05/16/2018 2125   BILIRUBINUR Negative 08/19/2013 1455   KETONESUR NEGATIVE 05/16/2018 2125   PROTEINUR NEGATIVE 05/16/2018 2125   NITRITE NEGATIVE 05/16/2018 2125   LEUKOCYTESUR NEGATIVE  05/16/2018 2125   LEUKOCYTESUR 3+ 08/19/2013 1455     RADIOLOGY: Dg Chest 1 View  Result Date: 05/16/2018 CLINICAL DATA:  Patient fell yesterday. Today not feeling well and unable to urinate. Started treatment several days ago for urinary tract infection. Weakness. EXAM: CHEST  1 VIEW COMPARISON:  05/15/2018 FINDINGS: Cardiac pacemaker. Cardiac enlargement. No vascular congestion, edema, or consolidation. Slight interstitial pattern to the lungs. No blunting of costophrenic angles. No pneumothorax. Calcification of the aorta. Postoperative changes in the thoracolumbar spine. IMPRESSION: Cardiac enlargement. No evidence of active pulmonary disease. Aortic atherosclerosis. Electronically Signed   By: Lucienne Capers M.D.   On: 05/16/2018 22:48   Dg Chest 2 View  Result Date: 05/15/2018 CLINICAL DATA:  Cough EXAM: CHEST - 2 VIEW COMPARISON:  10/30/2017 FINDINGS: The lungs are hyperinflated likely secondary to COPD. There is no focal parenchymal opacity. There is no pleural effusion or pneumothorax. The heart and mediastinal contours are unremarkable. There is a dual lead cardiac pacemaker. There is partially visualized posterior  thoracolumbar spinal fusion. IMPRESSION: No active cardiopulmonary disease. Electronically Signed   By: Kathreen Devoid   On: 05/15/2018 13:15    EKG: Orders placed or performed during the hospital encounter of 05/16/18  . EKG 12-Lead  . EKG 12-Lead    IMPRESSION AND PLAN:  1.  Acute metabolic encephalopathy likely related to acute renal failure.  We will start treatment with IV fluids and monitor clinically closely. 2.  Acute renal failure, likely prerenal.  Creatinine level is elevated at 2.1 from baseline of 0.7.  Will hold Lasix and start gentle IV hydration.  We will monitor kidney function closely and avoid nephrotoxic medications. 3.  CHF, currently clinically compensated.  Hold Lasix for now, as patient looks dehydrated.  Continue the rest of the medical  treatment.  She will likely need Lasix dose adjustment at discharge time. 4.  Diabetes type 2 with hyperglycemia.  Will monitor blood sugars before meals and at bedtime and use insulin treatment during the hospital stay.  All the records are reviewed and case discussed with ED provider. Management plans discussed with the patient, family and they are in agreement.  CODE STATUS: DNR Code Status History    Date Active Date Inactive Code Status Order ID Comments User Context   12/11/2017 2059 March 28, 202019 2127 DNR [id number]  Hillary Bow, MD ED   07/15/2017 1830 07/19/2017 1845 DNR [id number]  Salary, Avel Peace, MD Inpatient   04/14/2017 0344 04/18/2017 2138 Full Code [id number]  Lance Coon, MD Inpatient   12/08/2016 0107 12/09/2016 2031 Full Code [id number]  Lance Coon, MD ED   09/07/2015 0441 09/13/2015 1955 Full Code [id number]  Saundra Shelling, MD Inpatient   09/01/2015 1533 09/03/2015 1907 Full Code [id number]  Epifanio Lesches, MD ED   03/21/2015 0626 03/22/2015 1939 Full Code [id number]  Harrie Foreman, MD Inpatient    Questions for Most Recent Historical Code Status (Order [id number])    Question Answer Comment   In the event of cardiac or respiratory ARREST Do not call a "code blue"    In the event of cardiac or respiratory ARREST Do not perform Intubation, CPR, defibrillation or ACLS    In the event of cardiac or respiratory ARREST Use medication by any route, position, wound care, and other measures to relive pain and suffering. May use oxygen, suction and manual treatment of airway obstruction as needed for comfort.         Advance Directive Documentation     Most Recent Value  Type of Advance Directive  Healthcare Power of Attorney, Living will  Pre-existing out of facility DNR order (yellow form or pink MOST form)  -  "MOST" Form in Place?  -       TOTAL TIME TAKING CARE OF THIS PATIENT: 50 minutes.    Amelia Jo M.D on 05/17/2018 at 12:05 AM  Between 7am to 6pm - Pager  - [phone]  After 6pm go to [email or web address] - password [email or web address] [facility]  at [facility]  [phone]  CC: Primary care physician; Tracie Harrier, MD

## 2018-05-17 NOTE — Evaluation (Signed)
Physical Therapy Evaluation Patient Details [name] MRN: [id number] DOB: September 05, 1924 Today's Date: 05/17/2018   History of Present Illness  pt was admitted on 05/16/2018 with CC of difficulty urinating and progressive generalized weakness and had recently been seen for a UTI. she was dx with acute renal failure.  Clinical Impression  Pt is awake in bed with daughter present for PT evaluation. She is A&O x 4 and currently reports 5/10 pain in her low back. She is mod I with bed mobility requiring increased time and hand railings for assistance. With sit to stand she required cues for hand placement and throughout standing would wedge her legs against the bed and still demonstrated posterior lean as well with hands on RW requiring MOD A to prevent pt from falling back into the bed. She ambulated ~150 feet with RW on O2 at 2LPM satting consistently around 93%. She returned to the recliner and was able to perform exercises. She would benefit from physical therapy to increase strength, balance and gait to maximize function and safety. Based on caregiver support at home and assistance from family recommending home health PT to continue with physical therapy following discharge.     Follow Up Recommendations Home health PT(due to family and caregiver support)    Equipment Recommendations       Recommendations for Other Services       Precautions / Restrictions Precautions Precautions: Fall Restrictions Weight Bearing Restrictions: No      Mobility  Bed Mobility Overal bed mobility: Modified Independent             General bed mobility comments: used bed rails to assist with supine > sit and required increased time  Transfers Overall transfer level: Needs assistance   Transfers: Sit to/from Stand Sit to Stand: Mod assist         General transfer comment: pt required verbal cues on hand placement pushing from thebed, once standing pt required mod assist to  prevent from falling backward even when she was holding onto the RW. She uses the back of her legs to assist with balance when performing standing activity(she demonstrates impulsive behavior with sitting going quick)  Ambulation/Gait Ambulation/Gait assistance: Min guard Gait Distance (Feet): 150 Feet Assistive device: Rolling walker (2 wheeled) Gait Pattern/deviations: Decreased stride length;Trunk flexed;Shuffle     General Gait Details: cues to look forward versus looking down at her feet and to stay with the walker  Stairs            Wheelchair Mobility    Modified Rankin (Stroke Patients Only)       Balance                                             Pertinent Vitals/Pain Pain Assessment: 0-10 Pain Score: 5  Pain Location: back Pain Descriptors / Indicators: Aching;Sore Pain Intervention(s): Monitored during session    Home Living Family/patient expects to be discharged to:: Private residence Living Arrangements: Spouse/significant other;Other (Comment)(also had 2 caretakers throughout the day) Available Help at Discharge: Family;Available PRN/intermittently;Personal care attendant Type of Home: House Home Access: Stairs to enter Entrance Stairs-Rails: Right Entrance Stairs-Number of Steps: 4 Home Layout: One level;Other (Comment)(1 step inside) Home Equipment: Walker - 2 wheels;Shower seat      Prior Function Level of Independence: Needs assistance   Gait / Transfers Assistance Needed: Ambulates household distances  with rolling walker. reports she has fallen 2-3 x in the last 6 months  ADL's / Homemaking Assistance Needed: has caretakers assist with ADLs  Comments: Assist from aide as needed for ADLs; RW for ambulation within the home, 4WRW for limited ambulation within the community. Personal care attendent 3 hours in AM, 3 hours in PM for bathing assist, housekeeping.     Hand Dominance   Dominant Hand: Right    Extremity/Trunk  Assessment   Upper Extremity Assessment Upper Extremity Assessment: Overall WFL for tasks assessed    Lower Extremity Assessment Lower Extremity Assessment: RLE deficits/detail;LLE deficits/detail RLE Deficits / Details: overall 4-/5 in all planes but did require cues to sit up during testing due to pt attempting posterior trunk lean throughout testing LLE Deficits / Details: overall 4-/5 in all planes but did require cues to sit up during testing due to pt attempting posterior trunk lean throughout testing    Cervical / Trunk Assessment Cervical / Trunk Assessment: Kyphotic  Communication   Communication: HOH  Cognition Arousal/Alertness: Awake/alert Behavior During Therapy: WFL for tasks assessed/performed Overall Cognitive Status: Within Functional Limits for tasks assessed                                        General Comments      Exercises Other Exercises Other Exercises: seated marching 2 x 10 alternating L and R Other Exercises: LAQ bil 2 x 10 Other Exercises: resisted hip abduction 2 x 10 Other Exercises: ankle pumps 2 x 10 and glute set 1 x 10 holding 3 sec each.   Assessment/Plan    PT Assessment Patient needs continued PT services  PT Problem List Decreased strength;Decreased activity tolerance;Decreased balance;Decreased mobility;Decreased safety awareness;Decreased knowledge of use of DME       PT Treatment Interventions DME instruction;Gait training;Stair training;Therapeutic activities;Therapeutic exercise;Balance training;Functional mobility training    PT Goals (Current goals can be found in the Care Plan section)  Acute Rehab PT Goals Patient Stated Goal: to get back home PT Goal Formulation: With patient/family Time For Goal Achievement: 05/31/18 Potential to Achieve Goals: Good    Frequency Min 2X/week   Barriers to discharge        Co-evaluation               AM-PAC PT "6 Clicks" Mobility  Outcome Measure Help  needed turning from your back to your side while in a flat bed without using bedrails?: A Lot Help needed moving from lying on your back to sitting on the side of a flat bed without using bedrails?: A Lot Help needed moving to and from a bed to a chair (including a wheelchair)?: Total Help needed standing up from a chair using your arms (e.g., wheelchair or bedside chair)?: Total Help needed to walk in hospital room?: Total Help needed climbing 3-5 steps with a railing? : Total 6 Click Score: 8    End of Session Equipment Utilized During Treatment: Gait belt;Oxygen Activity Tolerance: Patient tolerated treatment well;Patient limited by fatigue Patient left: in chair;with chair alarm set;with family/visitor present Nurse Communication: Mobility status PT Visit Diagnosis: Unsteadiness on feet (R26.81);Muscle weakness (generalized) (M62.81);History of falling (Z91.81);Repeated falls (R29.6);Other abnormalities of gait and mobility (R26.89)    Time: 6270-3500 PT Time Calculation (min) (ACUTE ONLY): 34 min   Charges:   PT Evaluation $PT Eval Moderate Complexity: 1 Mod PT Treatments $Gait Training: 8-22  mins $Therapeutic Exercise: 8-22 mins        Adina Puzzo PT, DPT, LAT, ATC  05/17/18  11:09 AM       Jaana Brodt 05/17/2018, 11:03 AM

## 2018-05-17 NOTE — Progress Notes (Signed)
[facility] [place] [name]    MR#:  [id number]  DATE OF BIRTH:  May 03, 1925  SUBJECTIVE:  CHIEF COMPLAINT:   Chief Complaint  Patient presents with  . Urinary Retention  . Fall  felling better  REVIEW OF SYSTEMS:  CONSTITUTIONAL: No fever, fatigue or weakness.  EYES: No blurred or double vision.  EARS, NOSE, AND THROAT: No tinnitus or ear pain.  RESPIRATORY: No cough, shortness of breath, wheezing or hemoptysis.  CARDIOVASCULAR: No chest pain, orthopnea, edema.  GASTROINTESTINAL: No nausea, vomiting, diarrhea or abdominal pain.  GENITOURINARY: No dysuria, hematuria.  ENDOCRINE: No polyuria, nocturia,  HEMATOLOGY: No anemia, easy bruising or bleeding SKIN: No rash or lesion. MUSCULOSKELETAL: No joint pain or arthritis.   NEUROLOGIC: No tingling, numbness, weakness.  PSYCHIATRY: No anxiety or depression.   ROS  DRUG ALLERGIES:   Allergies  Allergen Reactions  . Ceftriaxone Swelling  . Cefuroxime Hives  . Cephalosporins Swelling and Other (See Comments)    Reaction:  Fainting/dry mouth   . Codeine Nausea And Vomiting  . Epinephrine Other (See Comments)    Reaction:  Fainting   . Morphine And Related Other (See Comments)    Reaction:  GI upset   . Buprenorphine Hcl Nausea And Vomiting  . Ciprofloxacin Itching, Swelling and Rash  . Latex Rash  . Lidocaine Rash  . Procaine Rash    VITALS:  Blood pressure 132/63, pulse 69, temperature 97.9 F (36.6 C), resp. rate 19, height 5\' 6"  (1.676 m), weight 79.6 kg, SpO2 96 %.  PHYSICAL EXAMINATION:  GENERAL:  84 y.o.-year-old patient lying in the bed with no acute distress.  EYES: Pupils equal, round, reactive to light and accommodation. No scleral icterus. Extraocular muscles intact.  HEENT: Head atraumatic, normocephalic. Oropharynx and nasopharynx clear.  NECK:  Supple, no jugular venous distention. No thyroid enlargement, no tenderness.  LUNGS: Normal breath sounds  bilaterally, no wheezing, rales,rhonchi or crepitation. No use of accessory muscles of respiration.  CARDIOVASCULAR: S1, S2 normal. No murmurs, rubs, or gallops.  ABDOMEN: Soft, nontender, nondistended. Bowel sounds present. No organomegaly or mass.  EXTREMITIES: No pedal edema, cyanosis, or clubbing.  NEUROLOGIC: Cranial nerves II through XII are intact. Muscle strength 5/5 in all extremities. Sensation intact. Gait not checked.  PSYCHIATRIC: The patient is alert and oriented x 3.  SKIN: No obvious rash, lesion, or ulcer.   Physical Exam LABORATORY PANEL:   CBC Recent Labs  Lab 05/17/18 0533  WBC 9.1  HGB 10.3*  HCT 33.5*  PLT 131*   ------------------------------------------------------------------------------------------------------------------  Chemistries  Recent Labs  Lab 05/16/18 2125 05/17/18 0533  NA 134* 140  K 4.7 4.1  CL 94* 101  CO2 29 29  GLUCOSE 339* 217*  BUN 66* 58*  CREATININE 2.13* 1.60*  CALCIUM 8.9 8.6*  AST 41  --   ALT 30  --   ALKPHOS 80  --   BILITOT 0.7  --    ------------------------------------------------------------------------------------------------------------------  Cardiac Enzymes No results for input(s): TROPONINI in the last 168 hours. ------------------------------------------------------------------------------------------------------------------  RADIOLOGY:  Dg Chest 1 View  Result Date: 05/16/2018 CLINICAL DATA:  Patient fell yesterday. Today not feeling well and unable to urinate. Started treatment several days ago for urinary tract infection. Weakness. EXAM: CHEST  1 VIEW COMPARISON:  05/15/2018 FINDINGS: Cardiac pacemaker. Cardiac enlargement. No vascular congestion, edema, or consolidation. Slight interstitial pattern to the lungs. No blunting of costophrenic angles. No pneumothorax. Calcification of the aorta. Postoperative changes  in the thoracolumbar spine. IMPRESSION: Cardiac enlargement. No evidence of active  pulmonary disease. Aortic atherosclerosis. Electronically Signed   By: Lucienne Capers M.D.   On: 05/16/2018 22:48   Dg Chest 2 View  Result Date: 05/15/2018 CLINICAL DATA:  Cough EXAM: CHEST - 2 VIEW COMPARISON:  2020/04/2318 FINDINGS: The lungs are hyperinflated likely secondary to COPD. There is no focal parenchymal opacity. There is no pleural effusion or pneumothorax. The heart and mediastinal contours are unremarkable. There is a dual lead cardiac pacemaker. There is partially visualized posterior thoracolumbar spinal fusion. IMPRESSION: No active cardiopulmonary disease. Electronically Signed   By: Kathreen Devoid   On: 05/15/2018 13:15    ASSESSMENT AND PLAN:   1.  Acute metabolic encephalopathy likely related to acute renal failure.  We will start treatment with IV fluids and monitor clinically closely. 2.  Acute renal failure, likely prerenal.  Creatinine level is elevated at 2.1 from baseline of 0.7.  Will hold Lasix and start gentle IV hydration.  We will monitor kidney function closely and avoid nephrotoxic medications. 3.  CHF, currently clinically compensated.  Hold Lasix for now, as patient looks dehydrated.  Continue the rest of the medical treatment.  She will likely need Lasix dose adjustment at discharge time. 4.  Diabetes type 2 with hyperglycemia.  Will monitor blood sugars before meals and at bedtime and use insulin treatment during the hospital stay.      All the records are reviewed and case discussed with Care Management/Social Workerr. Management plans discussed with the patient, family and they are in agreement.  CODE STATUS: dnr  TOTAL TIME TAKING CARE OF THIS PATIENT: 35 minutes.     POSSIBLE D/C IN 1-2 DAYS, DEPENDING ON CLINICAL CONDITION.   Avel Peace  M.D on 05/17/2018   Between 7am to 6pm - Pager - [phone]  After 6pm go to [email or web address] - password [email or web address] [facility]  Office  [phone]  CC: Primary care  physician; Tracie Harrier, MD  Note: This dictation was prepared with Dragon dictation along with smaller phrase technology. Any transcriptional errors that result from this process are unintentional.

## 2018-05-18 LAB — URINE CULTURE: Culture: NO GROWTH

## 2018-05-18 LAB — GLUCOSE, CAPILLARY
GLUCOSE-CAPILLARY: 146 mg/dL — AB (ref 70–99)
GLUCOSE-CAPILLARY: 265 mg/dL — AB (ref 70–99)

## 2018-05-18 MED ORDER — FUROSEMIDE 40 MG PO TABS: 20.0000 mg | ORAL_TABLET | Freq: Every day | ORAL | 0 refills | Status: DC

## 2018-05-18 MED ORDER — SPIRONOLACTONE 25 MG PO TABS: 12.5000 mg | ORAL_TABLET | Freq: Every day | ORAL | 0 refills | Status: DC

## 2018-05-18 NOTE — Progress Notes (Signed)
SLP Cancellation Note  Patient Details [name] MRN: [id number] DOB: 08-23-1924   Cancelled treatment:       Reason Eval/Treat Not Completed: SLP screened, no needs identified, will sign off(chart reviewed; consulted NSG then met w/ pt in room). Pt denied any difficulty swallowing and is currently on a regular diet; tolerates swallowing pills w/ water per NSG. Pt conversed at conversational level w/out deficits noted; pt and NSG denied any speech-language deficits during conversations this morning.  No further skilled ST services indicated as pt appears at her baseline. Pt agreed. NSG to reconsult if any change in status.     Orinda Kenner, MS, CCC-SLP [name] 05/18/2018, 11:10 AM

## 2018-05-18 NOTE — Plan of Care (Signed)

## 2018-05-18 NOTE — Discharge Summary (Signed)
[facility] [place] [name]    MR#:  [id number]  DATE OF BIRTH:  [birth year]  DATE OF ADMISSION:  05/16/2018 ADMITTING PHYSICIAN: Amelia Jo, MD  DATE OF DISCHARGE: No discharge date for patient encounter.  PRIMARY CARE PHYSICIAN: Tracie Harrier, MD    ADMISSION DIAGNOSIS:  Hyperglycemia [R73.9] Acute kidney injury ([place]) [N17.9]  DISCHARGE DIAGNOSIS:  Active Problems:   ARF (acute renal failure) ([place])   SECONDARY DIAGNOSIS:   Past Medical History:  Diagnosis Date  . Cancer ([place])    skin ca  . CHF (congestive heart failure) ([place])   . Chronic back pain   . COPD (chronic obstructive pulmonary disease) ([place])   . Diabetes mellitus without complication ([place])   . Hypertension   . Poor perfusion of leg     HOSPITAL COURSE:   1.Acute metabolic encephalopathy likely related to acute renal failure Resolved  2.Acute renal failure likely prerenal Resolved with IV fluids for rehydration, diuretics reduced in half on day of discharge  3.CHF exacerbation Stable on current regiment  4.Diabetes type 2 with hyperglycemia Controlled on current regiment   DISCHARGE CONDITIONS:  stable  CONSULTS OBTAINED:    DRUG ALLERGIES:   Allergies  Allergen Reactions  . Ceftriaxone Swelling  . Cefuroxime Hives  . Cephalosporins Swelling and Other (See Comments)    Reaction:  Fainting/dry mouth   . Codeine Nausea And Vomiting  . Epinephrine Other (See Comments)    Reaction:  Fainting   . Morphine And Related Other (See Comments)    Reaction:  GI upset   . Buprenorphine Hcl Nausea And Vomiting  . Ciprofloxacin Itching, Swelling and Rash  . Latex Rash  . Lidocaine Rash  . Procaine Rash    DISCHARGE MEDICATIONS:   Allergies as of 05/18/2018      Reactions   Ceftriaxone Swelling   Cefuroxime Hives   Cephalosporins Swelling, Other (See Comments)   Reaction:  Fainting/dry mouth    Codeine Nausea  And Vomiting   Epinephrine Other (See Comments)   Reaction:  Fainting    Morphine And Related Other (See Comments)   Reaction:  GI upset    Buprenorphine Hcl Nausea And Vomiting   Ciprofloxacin Itching, Swelling, Rash   Latex Rash   Lidocaine Rash   Procaine Rash      Medication List    TAKE these medications   acetaminophen 325 MG tablet Commonly known as:  TYLENOL Take 2 tablets (650 mg total) by mouth every 6 (six) hours as needed for mild pain (or Fever >/= 101).   aspirin EC 81 MG tablet Take 81 mg by mouth daily.   baclofen 10 MG tablet Commonly known as:  LIORESAL Take 10 mg by mouth 3 (three) times daily as needed.   buPROPion 150 MG 24 hr tablet Commonly known as:  WELLBUTRIN XL Take 1 tablet by mouth daily.   carvedilol 6.25 MG tablet Commonly known as:  COREG Take 6.25 mg by mouth 2 (two) times daily with a meal.   cholecalciferol 1000 units tablet Commonly known as:  VITAMIN D Take 1,000 Units by mouth daily.   docusate sodium 100 MG capsule Commonly known as:  COLACE Take 100 mg by mouth 2 (two) times daily.   escitalopram 10 MG tablet Commonly known as:  LEXAPRO Take 10 mg by mouth daily.   feeding supplement (ENSURE ENLIVE) Liqd Take 237 mLs by mouth 2 (two) times daily between meals.  ferrous sulfate 325 (65 FE) MG tablet Take 325 mg by mouth daily with breakfast.   furosemide 40 MG tablet Commonly known as:  LASIX Take 0.5 tablets (20 mg total) by mouth daily. What changed:  how much to take   gabapentin 100 MG capsule Commonly known as:  NEURONTIN Take 100-200 mg by mouth at bedtime.   glimepiride 4 MG tablet Commonly known as:  AMARYL Take 4 mg by mouth 2 (two) times daily.   JANUVIA 100 MG tablet Generic drug:  sitaGLIPtin Take 100 mg by mouth daily.   magnesium oxide 400 MG tablet Commonly known as:  MAG-OX Take 400 mg by mouth daily.   Melatonin 1 MG Tabs Take 1 mg by mouth at bedtime.   metFORMIN 500 MG  tablet Commonly known as:  GLUCOPHAGE Take 1 tablet by mouth daily.   multivitamin tablet Take 1 tablet by mouth daily.   nitrofurantoin (macrocrystal-monohydrate) 100 MG capsule Commonly known as:  MACROBID Take 100 mg by mouth 2 (two) times daily.   oxycodone 5 MG capsule Commonly known as:  OXY-IR Take 5 mg by mouth every 8 (eight) hours as needed.   pantoprazole 40 MG tablet Commonly known as:  PROTONIX Take 40 mg by mouth daily.   PROAIR HFA 108 (90 Base) MCG/ACT inhaler Generic drug:  albuterol Inhale 2 puffs into the lungs every 6 (six) hours as needed for wheezing or shortness of breath.   Rivaroxaban 15 MG Tabs tablet Commonly known as:  XARELTO Take 15 mg by mouth daily.   rOPINIRole 0.5 MG tablet Commonly known as:  REQUIP Take 0.5 mg by mouth 2 (two) times daily.   spironolactone 25 MG tablet Commonly known as:  ALDACTONE Take 0.5 tablets (12.5 mg total) by mouth daily. What changed:  how much to take   vitamin B-12 1000 MCG tablet Commonly known as:  CYANOCOBALAMIN Take 1,000 mcg by mouth daily.   vitamin C 500 MG tablet Commonly known as:  ASCORBIC ACID Take 500 mg by mouth daily.   Vitamin D (Ergocalciferol) 1.25 MG (50000 UT) Caps capsule Commonly known as:  DRISDOL Take 50,000 Units by mouth every 7 (seven) days. Pt takes on Thursday.        DISCHARGE INSTRUCTIONS:      If you experience worsening of your admission symptoms, develop shortness of breath, life threatening emergency, suicidal or homicidal thoughts you must seek medical attention immediately by calling 911 or calling your MD immediately  if symptoms less severe.  You Must read complete instructions/literature along with all the possible adverse reactions/side effects for all the Medicines you take and that have been prescribed to you. Take any new Medicines after you have completely understood and accept all the possible adverse reactions/side effects.   Please note  You  were cared for by a hospitalist during your hospital stay. If you have any questions about your discharge medications or the care you received while you were in the hospital after you are discharged, you can call the unit and asked to speak with the hospitalist on call if the hospitalist that took care of you is not available. Once you are discharged, your primary care physician will handle any further medical issues. Please note that NO REFILLS for any discharge medications will be authorized once you are discharged, as it is imperative that you return to your primary care physician (or establish a relationship with a primary care physician if you do not have one) for your aftercare needs  so that they can reassess your need for medications and monitor your lab values.    Today   CHIEF COMPLAINT:   Chief Complaint  Patient presents with  . Urinary Retention  . Fall    HISTORY OF PRESENT ILLNESS:  84 y.o. female with a known history of CHF, COPD, diabetes type 2, hypertension. Patient is currently confused, lethargic, unable to provide reliable history.  Information was taken from reviewing electronic medical record and from discussion with emergency room physician and the family. She was brought to emergency room due to worsening generalized weakness, poor urinary output, decreased appetite going on for the past 2 days, gradually getting worse.  Per family, patient was recently treated for UTI, she finished antibiotic course with nitrofurantoin yesterday. No reports of fever, chills, nausea, vomiting, diarrhea, bleeding. Blood test done emergency room are notable for elevated creatinine level at 2.1, elevated BUN at 63, elevated blood sugar at 339.  WBCs 11.2.  UA is negative for UTI. No acute cardiopulmonary abnormalities per chest x-ray. Patient is admitted for further evaluation and treatment.   VITAL SIGNS:  Blood pressure (!) 145/64, pulse 69, temperature 98.2 F (36.8 C), temperature  source Oral, resp. rate 18, height 5\' 6"  (1.676 m), weight 84.4 kg, SpO2 94 %.  I/O:    Intake/Output Summary (Last 24 hours) at 05/18/2018 0954 Last data filed at 05/17/2018 1854 Gross per 24 hour  Intake 600 ml  Output -  Net 600 ml    PHYSICAL EXAMINATION:  GENERAL:  84 y.o.-year-old patient lying in the bed with no acute distress.  EYES: Pupils equal, round, reactive to light and accommodation. No scleral icterus. Extraocular muscles intact.  HEENT: Head atraumatic, normocephalic. Oropharynx and nasopharynx clear.  NECK:  Supple, no jugular venous distention. No thyroid enlargement, no tenderness.  LUNGS: Normal breath sounds bilaterally, no wheezing, rales,rhonchi or crepitation. No use of accessory muscles of respiration.  CARDIOVASCULAR: S1, S2 normal. No murmurs, rubs, or gallops.  ABDOMEN: Soft, non-tender, non-distended. Bowel sounds present. No organomegaly or mass.  EXTREMITIES: No pedal edema, cyanosis, or clubbing.  NEUROLOGIC: Cranial nerves II through XII are intact. Muscle strength 5/5 in all extremities. Sensation intact. Gait not checked.  PSYCHIATRIC: The patient is alert and oriented x 3.  SKIN: No obvious rash, lesion, or ulcer.   DATA REVIEW:   CBC Recent Labs  Lab 05/17/18 0533  WBC 9.1  HGB 10.3*  HCT 33.5*  PLT 131*    Chemistries  Recent Labs  Lab 05/16/18 2125 05/17/18 0533  NA 134* 140  K 4.7 4.1  CL 94* 101  CO2 29 29  GLUCOSE 339* 217*  BUN 66* 58*  CREATININE 2.13* 1.60*  CALCIUM 8.9 8.6*  AST 41  --   ALT 30  --   ALKPHOS 80  --   BILITOT 0.7  --     Cardiac Enzymes No results for input(s): TROPONINI in the last 168 hours.  Microbiology Results  Results for orders placed or performed during the hospital encounter of 05/16/18  Urine Culture     Status: None   Collection Time: 05/16/18  9:25 PM  Result Value Ref Range Status   Specimen Description   Final    URINE, RANDOM Performed at [facility], [address] [place], [place] [address]    Special Requests   Final    NONE Performed at [facility], [address], [place], [place] [address]    Culture   Final  NO GROWTH Performed at [facility], [place] [address], [place], [place] [address]    Report Status 05/18/2018 FINAL  Final    RADIOLOGY:  Dg Chest 1 View  Result Date: 05/16/2018 CLINICAL DATA:  Patient fell yesterday. Today not feeling well and unable to urinate. Started treatment several days ago for urinary tract infection. Weakness. EXAM: CHEST  1 VIEW COMPARISON:  05/15/2018 FINDINGS: Cardiac pacemaker. Cardiac enlargement. No vascular congestion, edema, or consolidation. Slight interstitial pattern to the lungs. No blunting of costophrenic angles. No pneumothorax. Calcification of the aorta. Postoperative changes in the thoracolumbar spine. IMPRESSION: Cardiac enlargement. No evidence of active pulmonary disease. Aortic atherosclerosis. Electronically Signed   By: Lucienne Capers M.D.   On: 05/16/2018 22:48    EKG:   Orders placed or performed during the hospital encounter of 05/16/18  . EKG 12-Lead  . EKG 12-Lead      Management plans discussed with the patient, family and they are in agreement.  CODE STATUS:     Code Status Orders  (From admission, onward)         Start     Ordered   05/17/18 0119  Do not attempt resuscitation (DNR)  Continuous    Question Answer Comment  In the event of cardiac or respiratory ARREST Do not call a "code blue"   In the event of cardiac or respiratory ARREST Do not perform Intubation, CPR, defibrillation or ACLS   In the event of cardiac or respiratory ARREST Use medication by any route, position, wound care, and other measures to relive pain and suffering. May use oxygen, suction and manual treatment of airway obstruction as needed for comfort.      05/17/18 0118        Code Status History    Date Active Date Inactive Code Status Order ID Comments User  Context   12/11/2017 2059 03-16-202019 2127 DNR [id number]  Hillary Bow, MD ED   07/15/2017 1830 07/19/2017 1845 DNR [id number]  Gorden Harms, MD Inpatient   04/14/2017 0344 04/18/2017 2138 Full Code [id number]  Lance Coon, MD Inpatient   12/08/2016 0107 12/09/2016 2031 Full Code [id number]  Lance Coon, MD ED   09/07/2015 0441 09/13/2015 1955 Full Code [id number]  Saundra Shelling, MD Inpatient   09/01/2015 1533 09/03/2015 1907 Full Code [id number]  Epifanio Lesches, MD ED   03/21/2015 0626 03/22/2015 1939 Full Code [id number]  Harrie Foreman, MD Inpatient    Advance Directive Documentation     Most Recent Value  Type of Advance Directive  Healthcare Power of [place], Living will  Pre-existing out of facility DNR order (yellow form or pink MOST form)  -  "MOST" Form in Place?  -      TOTAL TIME TAKING CARE OF THIS PATIENT: 40 minutes.    Avel Peace Salary M.D on 05/18/2018 at 9:54 AM  Between 7am to 6pm - Pager - [phone]  After 6pm go to [email or web address] - password [email or web address] [facility]  Office  [phone]  CC: Primary care physician; Tracie Harrier, MD   Note: This dictation was prepared with Dragon dictation along with smaller phrase technology. Any transcriptional errors that result from this process are unintentional.

## 2018-05-18 NOTE — Care Management Note (Addendum)
Case Management Note  Patient Details  [name] MRN: [id number] Date of Birth: 1925/03/23  Subjective/Objective:  Admitted to [facility] with acute renal failure. Lives with husband Vicente Serene [phone]). Last seen Dr. Ginette Pitman 3 weeks ago. Prescriptions are filled at [facility]. Home Health per [facility] and [facility] in the past. [facility] x 1, [facility] x 1, and [facility] x 1 in the past. Noctural oxygen per [facility]. Private pay caregivers; Monday - Sunday 9:00 am-1:00pm and 6:00pm-10:00pm  Bedside commode, grab bars, shower chair, and rolling walker in the home.  Self feed, caregivers help with baths, dressing, and baths. Caregiver will transport.                  Action/Plan: Physical therapy is recommend home health and therapy in the home. Would like [facility]. Will update Kimberly at [facility].  Decided to have [facility] to come into the home. Will fax orders to [phone] Update to [facility] at [facility].    Expected Discharge Date:  05/18/18               Expected Discharge Plan:     In-House Referral:   yes  Discharge planning Services   yes  Post Acute Care Choice:    yes                                                                             Choice offered to:   patient  DME Arranged:    DME Agency:     HH Arranged:   yes HH Agency:   [facility]  Status of Service:     If discussed at [place] of Stay Meetings, dates discussed:    Additional Comments:  Shelbie Ammons, RN MSN CCM Care Management [phone] 05/18/2018, 11:03 AM

## 2018-05-22 ENCOUNTER — Telehealth: Payer: Self-pay

## 2018-05-22 NOTE — Telephone Encounter (Signed)
EMMI flagged for wound questions. CSW contacted patient at home to discuss concerns. CSW explained reason for the call. Patient states that she is doing ok at home and does not have any feelings of sadness or depression. Patient reports her only concern is her wound. Patient asked questions regarding wound care. CSW referred patient to look at her discharge summary information and to follow up with her doctor. Patient reports no other concerns. She refused to do PHQ-9 assessment. No signs of SI/HI.    [place], [place]

## 2018-06-01 ENCOUNTER — Emergency Department: Payer: Medicare Other

## 2018-06-01 ENCOUNTER — Encounter: Payer: Self-pay | Admitting: Emergency Medicine

## 2018-06-01 ENCOUNTER — Other Ambulatory Visit: Payer: Self-pay

## 2018-06-01 ENCOUNTER — Observation Stay
Admission: EM | Admit: 2018-06-01 | Discharge: 2018-06-03 | Disposition: A | Discharge status: Home / Self Care | Payer: Medicare Other | Attending: Internal Medicine | Admitting: Internal Medicine

## 2018-06-01 DIAGNOSIS — I130 Hypertensive heart and chronic kidney disease with heart failure and stage 1 through stage 4 chronic kidney disease, or unspecified chronic kidney disease: Secondary | ICD-10-CM | POA: Insufficient documentation

## 2018-06-01 DIAGNOSIS — M7989 Other specified soft tissue disorders: Secondary | ICD-10-CM | POA: Diagnosis present

## 2018-06-01 DIAGNOSIS — B964 Proteus (mirabilis) (morganii) as the cause of diseases classified elsewhere: Secondary | ICD-10-CM | POA: Diagnosis not present

## 2018-06-01 DIAGNOSIS — N179 Acute kidney failure, unspecified: Secondary | ICD-10-CM | POA: Insufficient documentation

## 2018-06-01 DIAGNOSIS — N182 Chronic kidney disease, stage 2 (mild): Secondary | ICD-10-CM | POA: Insufficient documentation

## 2018-06-01 DIAGNOSIS — J449 Chronic obstructive pulmonary disease, unspecified: Secondary | ICD-10-CM | POA: Diagnosis not present

## 2018-06-01 DIAGNOSIS — Z79899 Other long term (current) drug therapy: Secondary | ICD-10-CM | POA: Diagnosis not present

## 2018-06-01 DIAGNOSIS — M79605 Pain in left leg: Secondary | ICD-10-CM | POA: Insufficient documentation

## 2018-06-01 DIAGNOSIS — B965 Pseudomonas (aeruginosa) (mallei) (pseudomallei) as the cause of diseases classified elsewhere: Secondary | ICD-10-CM | POA: Diagnosis not present

## 2018-06-01 DIAGNOSIS — M25561 Pain in right knee: Secondary | ICD-10-CM

## 2018-06-01 DIAGNOSIS — L03116 Cellulitis of left lower limb: Secondary | ICD-10-CM | POA: Diagnosis not present

## 2018-06-01 DIAGNOSIS — A498 Other bacterial infections of unspecified site: Secondary | ICD-10-CM

## 2018-06-01 DIAGNOSIS — R0609 Other forms of dyspnea: Secondary | ICD-10-CM

## 2018-06-01 DIAGNOSIS — Z7982 Long term (current) use of aspirin: Secondary | ICD-10-CM | POA: Diagnosis not present

## 2018-06-01 DIAGNOSIS — I5032 Chronic diastolic (congestive) heart failure: Secondary | ICD-10-CM | POA: Diagnosis not present

## 2018-06-01 DIAGNOSIS — Z7901 Long term (current) use of anticoagulants: Secondary | ICD-10-CM | POA: Diagnosis not present

## 2018-06-01 DIAGNOSIS — E860 Dehydration: Secondary | ICD-10-CM | POA: Diagnosis not present

## 2018-06-01 LAB — BASIC METABOLIC PANEL
Anion gap: 6 (ref 5–15)
BUN: 27 mg/dL — AB (ref 8–23)
CO2: 23 mmol/L (ref 22–32)
Calcium: 8.8 mg/dL — ABNORMAL LOW (ref 8.9–10.3)
Chloride: 111 mmol/L (ref 98–111)
Creatinine, Ser: 0.97 mg/dL (ref 0.44–1.00)
GFR calc Af Amer: 59 mL/min — ABNORMAL LOW (ref >60)
GFR calc non Af Amer: 51 mL/min — ABNORMAL LOW (ref >60)
Glucose, Bld: 206 mg/dL — ABNORMAL HIGH (ref 70–99)
POTASSIUM: 4.6 mmol/L (ref 3.5–5.1)
Sodium: 140 mmol/L (ref 135–145)

## 2018-06-01 LAB — CBC WITH DIFFERENTIAL/PLATELET
Abs Immature Granulocytes: 0.02 10*3/uL (ref 0.00–0.07)
Basophils Absolute: 0.1 10*3/uL (ref 0.0–0.1)
Basophils Relative: 1 %
Eosinophils Absolute: 0.3 10*3/uL (ref 0.0–0.5)
Eosinophils Relative: 4 %
HEMATOCRIT: 36.5 % (ref 36.0–46.0)
Hemoglobin: 11.1 g/dL — ABNORMAL LOW (ref 12.0–15.0)
Immature Granulocytes: 0 %
Lymphocytes Relative: 14 %
Lymphs Abs: 1 10*3/uL (ref 0.7–4.0)
MCH: 29.8 pg (ref 26.0–34.0)
MCHC: 30.4 g/dL (ref 30.0–36.0)
MCV: 97.9 fL (ref 80.0–100.0)
Monocytes Absolute: 0.5 10*3/uL (ref 0.1–1.0)
Monocytes Relative: 7 %
Neutro Abs: 5.2 10*3/uL (ref 1.7–7.7)
Neutrophils Relative %: 74 %
Platelets: 226 10*3/uL (ref 150–400)
RBC: 3.73 MIL/uL — ABNORMAL LOW (ref 3.87–5.11)
RDW: 14.6 % (ref 11.5–15.5)
WBC: 7 10*3/uL (ref 4.0–10.5)
nRBC: 0 % (ref 0.0–0.2)

## 2018-06-01 LAB — CG4 I-STAT (LACTIC ACID): Lactic Acid, Venous: 1.83 mmol/L (ref 0.5–1.9)

## 2018-06-01 LAB — BRAIN NATRIURETIC PEPTIDE: B Natriuretic Peptide: 291 pg/mL — ABNORMAL HIGH (ref 0.0–100.0)

## 2018-06-01 LAB — TROPONIN I: Troponin I: <0.03 ng/mL (ref <0.03)

## 2018-06-01 MED ORDER — IOHEXOL 350 MG/ML SOLN
75.0000 mL | Freq: Once | INTRAVENOUS | Status: AC | PRN
  Administered 2018-06-01: 75 mL via INTRAVENOUS

## 2018-06-01 MED ORDER — GENTAMICIN SULFATE 40 MG/ML IJ SOLN
2.5000 mg/kg | Freq: Once | INTRAVENOUS | Status: AC
  Administered 2018-06-01: 200 mg via INTRAVENOUS
  Filled 2018-06-01: qty 5

## 2018-06-01 NOTE — ED Notes (Signed)
Pt resting at this time. NAD. Daughter remains at bedside.

## 2018-06-01 NOTE — ED Notes (Signed)
Pt back from xray at this time.

## 2018-06-01 NOTE — ED Notes (Signed)
Patient transported to CT 

## 2018-06-01 NOTE — ED Provider Notes (Signed)
Seen this patient in conjunction with Charlsie Merles, NP.  84 year old female with left lower extremity cellulitis.  While here, the patient remained hemodynamically stable and afebrile.  She had ultrasound studies that were negative for DVT and an angiogram of the chest which did not show any PE.  Patient has received gentamicin per culture results and reports no pain on my examination.  She will be admitted to the hospitalist in stable condition.   Eula Listen, MD 06/01/18 [phone]

## 2018-06-01 NOTE — ED Provider Notes (Signed)
[facility] Emergency Department Provider Note  ____________________________________________   None    (approximate)  I have reviewed the triage vital signs and the nursing notes.   HISTORY  Chief Complaint Cellulitis and Leg Swelling  HPI [name] is a 84 y.o. female who presents to the emergency department for treatment and evaluation of left lower extremity pain and swelling with known cellulitis to the left great toe. Dr. Elvina Mattes cultured the wound and discovered that there were 2 types of bacteria that are susceptible to Cipro, however the patient has a documented allergy.  No other oral antibiotics will cover the infection.  Therefore, he recommends that the patient be admitted for IV antibiotics.  Also, the patient saw her primary care provider today and was sent to the emergency department due to adventitious breath sounds and concern for CHF exacerbation.  The patient complains of feeling short of breath, but denies any chest pain.  She also states that she feels generally weak and has had a couple of falls over the past couple of weeks.  2 weeks ago she states that she fell and injured her right knee.  She is not told her daughter about this and prefers that her daughter not know.  Left leg has become more swollen and painful today.  No known fever, nausea, vomiting, or other symptoms of concern.   Past Medical History:  Diagnosis Date  . Cancer ([place])    skin ca  . CHF (congestive heart failure) ([place])   . Chronic back pain   . COPD (chronic obstructive pulmonary disease) ([place])   . Diabetes mellitus without complication ([place])   . Hypertension   . Poor perfusion of leg     Patient Active Problem List   Diagnosis Date Noted  . Cellulitis of left lower extremity 06/02/2018  . ARF (acute renal failure) ([place]) 05/17/2018  . Pneumonia 12/11/2017  . CAP (community acquired pneumonia) 04/14/2017  . COPD (chronic obstructive pulmonary disease)  ([place]) 12/07/2016  . UTI (urinary tract infection) 12/07/2016  . Ventral hernia without obstruction or gangrene   . Hyponatremia 09/07/2015  . Acute respiratory failure ([place]) 09/01/2015  . Atrial fibrillation ([place]) 04/10/2015  . Essential hypertension 04/10/2015  . Diabetes ([place]) 04/10/2015  . Acute on chronic combined systolic and diastolic CHF (congestive heart failure) ([place]) 03/21/2015  . Pulmonary nodule 11/10/2014    Past Surgical History:  Procedure Laterality Date  . ABDOMINAL HYSTERECTOMY    . BACK SURGERY     x3  . CORONARY STENT PLACEMENT     unknown location per pt  . FOOT SURGERY    . LEG SURGERY    . PACEMAKER PLACEMENT    . TONSILLECTOMY      Prior to Admission medications   Medication Sig Start Date End Date Taking? Authorizing Provider  aspirin EC 81 MG tablet Take 81 mg by mouth daily.   Yes [provider]  baclofen (LIORESAL) 10 MG tablet Take 10 mg by mouth 3 (three) times daily as needed.    Yes [provider]  carvedilol (COREG) 6.25 MG tablet Take 6.25 mg by mouth 2 (two) times daily with a meal.    Yes [provider]  cholecalciferol (VITAMIN D) 1000 units tablet Take 1,000 Units by mouth daily.   Yes [provider]  docusate sodium (COLACE) 100 MG capsule Take 100 mg by mouth 2 (two) times daily.    Yes [provider]  escitalopram (LEXAPRO) 10 MG  tablet Take 10 mg by mouth daily.   Yes [provider]  ferrous sulfate 325 (65 FE) MG tablet Take 325 mg by mouth daily with breakfast.   Yes [provider]  furosemide (LASIX) 40 MG tablet Take 0.5 tablets (20 mg total) by mouth daily. 05/18/18  Yes Salary, Avel Peace, MD  gabapentin (NEURONTIN) 100 MG capsule Take 100-200 mg by mouth at bedtime.   Yes [provider]  glimepiride (AMARYL) 4 MG tablet Take 4 mg by mouth 2 (two) times daily. 07/01/17  Yes [provider]  magnesium oxide (MAG-OX) 400 MG tablet Take 400 mg by  mouth daily.   Yes [provider]  Melatonin 1 MG TABS Take 1 mg by mouth at bedtime.   Yes [provider]  Multiple Vitamin (MULTIVITAMIN) tablet Take 1 tablet by mouth daily.   Yes [provider]  oxycodone (OXY-IR) 5 MG capsule Take 5 mg by mouth every 8 (eight) hours as needed.   Yes [provider]  pantoprazole (PROTONIX) 40 MG tablet Take 40 mg by mouth daily.   Yes [provider]  PROAIR HFA 108 (90 Base) MCG/ACT inhaler Inhale 2 puffs into the lungs every 6 (six) hours as needed for wheezing or shortness of breath.  10/02/17  Yes [provider]  Rivaroxaban (XARELTO) 15 MG TABS tablet Take 15 mg by mouth daily.   Yes [provider]  rOPINIRole (REQUIP) 0.5 MG tablet Take 0.5 mg by mouth 2 (two) times daily.   Yes [provider]  sitaGLIPtin (JANUVIA) 100 MG tablet Take 100 mg by mouth daily. 12/06/16  Yes [provider]  spironolactone (ALDACTONE) 25 MG tablet Take 0.5 tablets (12.5 mg total) by mouth daily. 05/18/18  Yes Salary, Avel Peace, MD  vitamin B-12 (CYANOCOBALAMIN) 1000 MCG tablet Take 1,000 mcg by mouth daily.   Yes [provider]  vitamin C (ASCORBIC ACID) 500 MG tablet Take 500 mg by mouth daily.   Yes [provider]  acetaminophen (TYLENOL) 325 MG tablet Take 2 tablets (650 mg total) by mouth every 6 (six) hours as needed for mild pain (or Fever >/= 101). Patient not taking: Reported on 06/02/2018 04/18/17   Nicholes Mango, MD  buPROPion (WELLBUTRIN XL) 150 MG 24 hr tablet Take 1 tablet by mouth daily. 11/28/17   [provider]  feeding supplement, ENSURE ENLIVE, (ENSURE ENLIVE) LIQD Take 237 mLs by mouth 2 (two) times daily between meals. 12/15/17   Bettey Costa, MD  metFORMIN (GLUCOPHAGE) 500 MG tablet Take 1 tablet by mouth daily. 12/10/17   [provider]  Vitamin D, Ergocalciferol, (DRISDOL) 50000 UNITS CAPS capsule Take 50,000 Units by mouth every 7  (seven) days. Pt takes on Thursday.    [provider]    Allergies Ceftriaxone; Cefuroxime; Cephalosporins; Codeine; Epinephrine; Morphine and related; Buprenorphine hcl; Ciprofloxacin; Latex; Lidocaine; and Procaine  Family History  Problem Relation Age of Onset  . CVA Mother        Congestive Heart Failure, heart attack, hypertension, stroke  . Diabetes Mellitus II Sister   . Heart disease Father        Congestive Heart Failure, heart attack, hypertension    Social History Social History   Tobacco Use  . Smoking status: Never Smoker  . Smokeless tobacco: Never Used  Substance Use Topics  . Alcohol use: No    Alcohol/week: 0.0 standard drinks  . Drug use: No    Review of Systems  Constitutional:  No fever/chills Eyes: No visual changes. ENT: No sore throat. Cardiovascular: Denies chest pain. Respiratory: Denies shortness of breath. Gastrointestinal: No abdominal pain.  No nausea, no vomiting.  No diarrhea.  No constipation. Genitourinary: Negative for dysuria. Musculoskeletal: Negative for back pain. Skin: Negative for rash. Neurological: Negative for headaches, focal weakness or numbness. ____________________________________________   PHYSICAL EXAM:  VITAL SIGNS: ED Triage Vitals  Enc Vitals Group     BP 06/01/18 1608 (!) 136/48     Pulse Rate 06/01/18 1608 75     Resp 06/01/18 1608 20     Temp 06/01/18 1608 97.8 F (36.6 C)     Temp Source 06/01/18 1608 Oral     SpO2 06/01/18 1608 95 %     Weight 06/01/18 1609 173 lb (78.5 kg)     Height 06/01/18 1609 5\' 6"  (1.676 m)     Head Circumference --      Peak Flow --      Pain Score 06/01/18 1609 10     Pain Loc --      Pain Edu? --      Excl. in [place]? --     Constitutional: Alert and oriented. Well appearing and in no acute distress. Eyes: Conjunctivae are normal. PERRL. EOMI. Head: Atraumatic. Nose: No congestion/rhinnorhea. Mouth/Throat: Mucous membranes are moist.  Oropharynx  non-erythematous. Neck: No stridor.   Cardiovascular: Normal rate, regular rhythm. Grossly normal heart sounds.  Good peripheral circulation. Respiratory: Normal respiratory effort.  No retractions. Lungs CTAB. Gastrointestinal: Soft and nontender. No distention. No abdominal bruits. No CVA tenderness. Musculoskeletal: No lower extremity tenderness nor edema.  No joint effusions. Neurologic:  Normal speech and language. No gross focal neurologic deficits are appreciated. No gait instability. Skin:  Skin is warm, dry and intact. No rash noted. Psychiatric: Mood and affect are normal. Speech and behavior are normal.  ____________________________________________   LABS (all labs ordered are listed, but only abnormal results are displayed)  Labs Reviewed  BASIC METABOLIC PANEL - Abnormal; Notable for the following components:      Result Value   Glucose, Bld 206 (*)    BUN 27 (*)    Calcium 8.8 (*)    GFR calc non Af Amer 51 (*)    GFR calc Af Amer 59 (*)    All other components within normal limits  BRAIN NATRIURETIC PEPTIDE - Abnormal; Notable for the following components:   B Natriuretic Peptide 291.0 (*)    All other components within normal limits  CBC WITH DIFFERENTIAL/PLATELET - Abnormal; Notable for the following components:   RBC 3.73 (*)    Hemoglobin 11.1 (*)    All other components within normal limits  TROPONIN I  I-STAT CG4 LACTIC ACID, ED  CG4 I-STAT (LACTIC ACID)   ____________________________________________  EKG  ED ECG REPORT I, Rabecca Birge, FNP-BC, personally viewed and interpreted this ECG.   Date: 06/01/2018  EKG Time: 1621  Rate: 70  Rhythm: Ventricular paced rhythm.  Axis: Normal  Intervals:none  ST&T Change: none  ____________________________________________  RADIOLOGY  ED MD interpretation: Chest x-ray is negative for acute cardiopulmonary abnormality per radiology.  CT angio chest is negative for PE.  Official radiology report(s): Dg  Chest 2 View  Result Date: 06/01/2018 CLINICAL DATA:  Shortness of breath EXAM: CHEST - 2 VIEW COMPARISON:  05/16/2018, 2020-08-1817 FINDINGS: Left-sided pacing device as before. Mild cardiomegaly with aortic atherosclerosis. No acute airspace disease. No pleural effusion. Hyperinflation. Surgical hardware within the thoracolumbar spine. No pneumothorax. IMPRESSION:  No active cardiopulmonary disease.  Mild cardiomegaly. Electronically Signed   By: Donavan Foil M.D.   On: 06/01/2018 17:06   Ct Angio Chest Pe W And/or Wo Contrast  Result Date: 06/01/2018 CLINICAL DATA:  Acute shortness of breath. Bilateral leg swelling. EXAM: CT ANGIOGRAPHY CHEST WITH CONTRAST TECHNIQUE: Multidetector CT imaging of the chest was performed using the standard protocol during bolus administration of intravenous contrast. Multiplanar CT image reconstructions and MIPs were obtained to evaluate the vascular anatomy. CONTRAST:  90mL OMNIPAQUE IOHEXOL 350 MG/ML SOLN COMPARISON:  Chest radiographs obtained earlier today. Chest CT dated 03/15/2016. FINDINGS: Cardiovascular: Normally opacified pulmonary arteries with no pulmonary arterial filling defects seen. Atheromatous calcifications, including the coronary arteries and aorta. Enlarged heart, most pronounced involving the right atrium followed by the left atrium. Mediastinum/Nodes: Enlarged subcarinal node with a short axis diameter of 23 mm on image number 132 series 6. Mildly enlarged right hilar nodes, including a node with a short axis diameter of 10 mm on image number 140 series 6. Multiple enlarged superior mediastinal nodes, including a right anterior paratracheal node with a short axis diameter of 19 mm on image number 94 series 6. Unremarkable thyroid gland and esophagus. Lungs/Pleura: 8 x 3 mm subpleural nodule in the left lower lobe on image number 53 series 5, unchanged. 8 mm right lower lobe nodule on image number 73 series 5, unchanged. A previously demonstrated 7 mm right  lower lobe nodule is no longer visualized. A previously demonstrated smaller right lower lobe nodule is also no longer visualized. Mild patchy ground-glass opacity scattered in both lungs with mild progression. Mild increase in prominence of the interstitial markings in both lower lung zones with septal thickening. No pleural fluid. Upper Abdomen: Reflux of contrast into the hepatic veins and inferior vena cava. Artifacts produced by lumbar spine fixation hardware. Musculoskeletal: Stable thoracolumbar spine fixation hardware and T10 vertebral body old compression deformity with mild bony retropulsion. Thoracic and lower cervical spine degenerative changes. Review of the MIP images confirms the above findings. IMPRESSION: 1. No pulmonary emboli. 2. Mild changes of congestive heart failure with right heart failure. 3. Mildly progressive mediastinal and right hilar adenopathy, most likely reactive. 4. Stable bilateral lower lobe pulmonary nodules, previously stable since 2016. The long-term stability is compatible with a benign process. 5.  Calcific coronary artery and aortic atherosclerosis. Aortic Atherosclerosis (ICD10-I70.0). Electronically Signed   By: Claudie Revering M.D.   On: 06/01/2018 22:40   US Venous Img Lower Unilateral Left  Result Date: 06/01/2018 CLINICAL DATA:  Left lower extremity pain and edema EXAM: Left LOWER EXTREMITY VENOUS DOPPLER ULTRASOUND TECHNIQUE: Gray-scale sonography with graded compression, as well as color Doppler and duplex ultrasound were performed to evaluate the lower extremity deep venous systems from the level of the common femoral vein and including the common femoral, femoral, profunda femoral, popliteal and calf veins including the posterior tibial, peroneal and gastrocnemius veins when visible. The superficial great saphenous vein was also interrogated. Spectral Doppler was utilized to evaluate flow at rest and with distal augmentation maneuvers in the common femoral,  femoral and popliteal veins. COMPARISON:  04/14/2017 FINDINGS: Contralateral Common Femoral Vein: Respiratory phasicity is normal and symmetric with the symptomatic side. No evidence of thrombus. Normal compressibility. Common Femoral Vein: No evidence of thrombus. Normal compressibility, respiratory phasicity and response to augmentation. Saphenofemoral Junction: No evidence of thrombus. Normal compressibility and flow on color Doppler imaging. Profunda Femoral Vein: No evidence of thrombus. Normal compressibility and flow on color Doppler  imaging. Femoral Vein: No evidence of thrombus. Normal compressibility, respiratory phasicity and response to augmentation. Popliteal Vein: No evidence of thrombus. Normal compressibility, respiratory phasicity and response to augmentation. Calf Veins: Poorly visualized. Other Findings:  None. IMPRESSION: No evidence of deep venous thrombosis. Electronically Signed   By: Donavan Foil M.D.   On: 06/01/2018 20:51   Dg Knee Complete 4 Views Right  Result Date: 06/01/2018 CLINICAL DATA:  Pain, post fall swelling and bruising EXAM: RIGHT KNEE - COMPLETE 4+ VIEW COMPARISON:  07/15/2017 FINDINGS: No fracture or malalignment. Moderate degenerative change medial compartment. No significant knee effusion. Vascular calcifications. IMPRESSION: No acute osseous abnormality.  Moderate arthritis. Electronically Signed   By: Donavan Foil M.D.   On: 06/01/2018 20:03    ____________________________________________   PROCEDURES  Procedure(s) performed: None  Procedures  Critical Care performed: No  ____________________________________________   INITIAL IMPRESSION / ASSESSMENT AND PLAN / ED COURSE  As part of my medical decision making, I reviewed the following data within the electronic MEDICAL RECORD NUMBER Notes from prior ED visits   84 year-old female presenting to the emergency department for treatment and evaluation of multiple medical complaints.  Initially, she was  sent by podiatry for IV antibiotics due to presence of Proteus Mirabilis and pseudomonas aeruginosa growing in a wound on her left great toe.  Patient subsequently had a visit with her primary care provider who had some concern of CHF exacerbation due to patient's complaint of shortness of breath and reports hearing crackles in the lower lobes.  Review of the wound culture shows that both of the bacteria are susceptible to gentamicin which is 1 of the few medications that the patient does not have any allergies to, therefore she will be given a dose of IV here in the ER.   Chest x-ray is negative for acute findings, therefore CTA has been ordered to rule out PE due to patient's complaint of shortness of breath as well as the increase in swelling in the left lower extremity today.  The sound of the left lower extremity also requested.  Ultrasound of the left lower extremity is negative for DVT.  CTA is negative for PE.  Patient will be admitted for IV antibiotics. Patient and daughter agree to the plan.     ____________________________________________   FINAL CLINICAL IMPRESSION(S) / ED DIAGNOSES  Final diagnoses:  Cellulitis of left lower extremity  Proteus mirabilis infection  Pseudomonas aeruginosa infection  Dyspnea on exertion  Acute pain of right knee     ED Discharge Orders    None       Note:  This document was prepared using Dragon voice recognition software and may include unintentional dictation errors.    Victorino Dike, FNP 06/02/18 [id number]    Eula Listen, MD 06/02/18 [phone]

## 2018-06-01 NOTE — ED Notes (Signed)
Pt wound was undressed and rewrapped with xeroform and kerlex.

## 2018-06-01 NOTE — ED Notes (Signed)
Patient transported to X-ray 

## 2018-06-01 NOTE — ED Triage Notes (Signed)
Pt was on doxycycline for UTI and had infection of great toe noticed by PCP. He thought doxy would cover but cultured it.  Received culture results today and doxy does not cover but patient allergic to all abx that would orally treat so sent to ED. Cellulitis has not extended up to calf from toe.  Pt also would like to be seen for [facility] and swelling to BLE r/t CHF.  Per daughter doctor heard crackles in lungs and would like her to be evaluated for her CHF as well.  Unlabored in triage. VSS.  Color WNL.  Both legs have noted swelling, left worse than right.

## 2018-06-02 ENCOUNTER — Encounter: Payer: Self-pay | Admitting: Internal Medicine

## 2018-06-02 ENCOUNTER — Other Ambulatory Visit: Payer: Self-pay

## 2018-06-02 DIAGNOSIS — L03116 Cellulitis of left lower limb: Secondary | ICD-10-CM | POA: Diagnosis present

## 2018-06-02 LAB — GLUCOSE, CAPILLARY
GLUCOSE-CAPILLARY: 139 mg/dL — AB (ref 70–99)
GLUCOSE-CAPILLARY: 223 mg/dL — AB (ref 70–99)
Glucose-Capillary: 152 mg/dL — ABNORMAL HIGH (ref 70–99)
Glucose-Capillary: 156 mg/dL — ABNORMAL HIGH (ref 70–99)
Glucose-Capillary: 189 mg/dL — ABNORMAL HIGH (ref 70–99)

## 2018-06-02 MED ORDER — GABAPENTIN 100 MG PO CAPS
100.0000 mg | ORAL_CAPSULE | Freq: Every day | ORAL | Status: DC
  Administered 2018-06-02: 200 mg via ORAL
  Filled 2018-06-02: qty 2

## 2018-06-02 MED ORDER — ALBUTEROL SULFATE (2.5 MG/3ML) 0.083% IN NEBU: 2.5000 mg | INHALATION_SOLUTION | Freq: Four times a day (QID) | RESPIRATORY_TRACT | Status: DC | PRN

## 2018-06-02 MED ORDER — PANTOPRAZOLE SODIUM 40 MG PO TBEC
40.0000 mg | DELAYED_RELEASE_TABLET | Freq: Every day | ORAL | Status: DC
  Administered 2018-06-02 – 2018-06-03 (×2): 40 mg via ORAL
  Filled 2018-06-02 (×2): qty 1

## 2018-06-02 MED ORDER — ROPINIROLE HCL 0.25 MG PO TABS
0.5000 mg | ORAL_TABLET | Freq: Two times a day (BID) | ORAL | Status: DC
  Administered 2018-06-02 – 2018-06-03 (×3): 0.5 mg via ORAL
  Filled 2018-06-02 (×5): qty 2

## 2018-06-02 MED ORDER — INSULIN ASPART 100 UNIT/ML ~~LOC~~ SOLN
0.0000 [IU] | Freq: Three times a day (TID) | SUBCUTANEOUS | Status: DC
  Administered 2018-06-02: 2 [IU] via SUBCUTANEOUS
  Administered 2018-06-02: 3 [IU] via SUBCUTANEOUS
  Administered 2018-06-02: 5 [IU] via SUBCUTANEOUS
  Administered 2018-06-03: 11 [IU] via SUBCUTANEOUS
  Filled 2018-06-02 (×4): qty 1

## 2018-06-02 MED ORDER — SENNOSIDES-DOCUSATE SODIUM 8.6-50 MG PO TABS: 1.0000 | ORAL_TABLET | Freq: Every evening | ORAL | Status: DC | PRN

## 2018-06-02 MED ORDER — ENSURE ENLIVE PO LIQD
237.0000 mL | Freq: Two times a day (BID) | ORAL | Status: DC
  Administered 2018-06-02 – 2018-06-03 (×2): 237 mL via ORAL

## 2018-06-02 MED ORDER — MELATONIN 5 MG PO TABS
2.5000 mg | ORAL_TABLET | Freq: Every day | ORAL | Status: DC
  Administered 2018-06-02: 2.5 mg via ORAL
  Filled 2018-06-02 (×3): qty 0.5

## 2018-06-02 MED ORDER — CLINDAMYCIN PHOSPHATE 600 MG/50ML IV SOLN
600.0000 mg | Freq: Three times a day (TID) | INTRAVENOUS | Status: DC
  Administered 2018-06-02 (×2): 600 mg via INTRAVENOUS
  Filled 2018-06-02 (×4): qty 50

## 2018-06-02 MED ORDER — SODIUM CHLORIDE 0.9 % IV SOLN
1.0000 g | Freq: Two times a day (BID) | INTRAVENOUS | Status: DC
  Administered 2018-06-02 (×2): 1 g via INTRAVENOUS
  Filled 2018-06-02 (×7): qty 1

## 2018-06-02 MED ORDER — OXYCODONE HCL 5 MG PO TABS
5.0000 mg | ORAL_TABLET | ORAL | Status: DC | PRN
  Administered 2018-06-02: 10 mg via ORAL
  Administered 2018-06-02 – 2018-06-03 (×2): 5 mg via ORAL
  Filled 2018-06-02: qty 1
  Filled 2018-06-02: qty 2
  Filled 2018-06-02: qty 1

## 2018-06-02 MED ORDER — RIVAROXABAN 15 MG PO TABS
15.0000 mg | ORAL_TABLET | Freq: Every day | ORAL | Status: DC
  Administered 2018-06-02: 15 mg via ORAL
  Filled 2018-06-02 (×2): qty 1

## 2018-06-02 MED ORDER — CARVEDILOL 3.125 MG PO TABS
6.2500 mg | ORAL_TABLET | Freq: Two times a day (BID) | ORAL | Status: DC
  Administered 2018-06-02 – 2018-06-03 (×3): 6.25 mg via ORAL
  Filled 2018-06-02 (×3): qty 2

## 2018-06-02 MED ORDER — ASPIRIN EC 81 MG PO TBEC
81.0000 mg | DELAYED_RELEASE_TABLET | Freq: Every day | ORAL | Status: DC
  Administered 2018-06-02 – 2018-06-03 (×2): 81 mg via ORAL
  Filled 2018-06-02 (×2): qty 1

## 2018-06-02 MED ORDER — BUPROPION HCL ER (XL) 150 MG PO TB24
150.0000 mg | ORAL_TABLET | Freq: Every day | ORAL | Status: DC
  Administered 2018-06-02 – 2018-06-03 (×2): 150 mg via ORAL
  Filled 2018-06-02 (×2): qty 1

## 2018-06-02 MED ORDER — MAGNESIUM OXIDE 400 (241.3 MG) MG PO TABS
400.0000 mg | ORAL_TABLET | Freq: Every day | ORAL | Status: DC
  Administered 2018-06-02 – 2018-06-03 (×2): 400 mg via ORAL
  Filled 2018-06-02 (×2): qty 1

## 2018-06-02 MED ORDER — SODIUM CHLORIDE 0.9 % IV SOLN
1.0000 g | Freq: Three times a day (TID) | INTRAVENOUS | Status: DC
  Administered 2018-06-02: 1 g via INTRAVENOUS
  Filled 2018-06-02 (×2): qty 1

## 2018-06-02 MED ORDER — SODIUM CHLORIDE 0.45 % IV SOLN
INTRAVENOUS | Status: AC
  Administered 2018-06-02: 02:00:00 via INTRAVENOUS

## 2018-06-02 MED ORDER — BISACODYL 5 MG PO TBEC
5.0000 mg | DELAYED_RELEASE_TABLET | Freq: Every day | ORAL | Status: DC | PRN
  Administered 2018-06-02: 5 mg via ORAL
  Filled 2018-06-02: qty 1

## 2018-06-02 MED ORDER — SPIRONOLACTONE 25 MG PO TABS
12.5000 mg | ORAL_TABLET | Freq: Every day | ORAL | Status: DC
  Filled 2018-06-02: qty 0.5

## 2018-06-02 MED ORDER — ADULT MULTIVITAMIN W/MINERALS CH
1.0000 | ORAL_TABLET | Freq: Every day | ORAL | Status: DC
  Administered 2018-06-02 – 2018-06-03 (×2): 1 via ORAL
  Filled 2018-06-02 (×2): qty 1

## 2018-06-02 MED ORDER — VITAMIN C 500 MG PO TABS
500.0000 mg | ORAL_TABLET | Freq: Every day | ORAL | Status: DC
  Administered 2018-06-02 – 2018-06-03 (×2): 500 mg via ORAL
  Filled 2018-06-02 (×2): qty 1

## 2018-06-02 MED ORDER — HYDROMORPHONE HCL 1 MG/ML IJ SOLN: 0.5000 mg | INTRAMUSCULAR | Status: DC | PRN

## 2018-06-02 MED ORDER — ONDANSETRON HCL 4 MG PO TABS: 4.0000 mg | ORAL_TABLET | Freq: Four times a day (QID) | ORAL | Status: DC | PRN

## 2018-06-02 MED ORDER — VITAMIN B-12 1000 MCG PO TABS
1000.0000 ug | ORAL_TABLET | Freq: Every day | ORAL | Status: DC
  Administered 2018-06-02 – 2018-06-03 (×2): 1000 ug via ORAL
  Filled 2018-06-02 (×2): qty 1

## 2018-06-02 MED ORDER — ACETAMINOPHEN 650 MG RE SUPP: 650.0000 mg | Freq: Four times a day (QID) | RECTAL | Status: DC | PRN

## 2018-06-02 MED ORDER — BACLOFEN 10 MG PO TABS
10.0000 mg | ORAL_TABLET | Freq: Three times a day (TID) | ORAL | Status: DC | PRN
  Filled 2018-06-02: qty 1

## 2018-06-02 MED ORDER — FUROSEMIDE 20 MG PO TABS
20.0000 mg | ORAL_TABLET | Freq: Every day | ORAL | Status: DC
  Filled 2018-06-02: qty 1

## 2018-06-02 MED ORDER — ONDANSETRON HCL 4 MG/2ML IJ SOLN: 4.0000 mg | Freq: Four times a day (QID) | INTRAMUSCULAR | Status: DC | PRN

## 2018-06-02 MED ORDER — ESCITALOPRAM OXALATE 10 MG PO TABS
10.0000 mg | ORAL_TABLET | Freq: Every day | ORAL | Status: DC
  Administered 2018-06-02 – 2018-06-03 (×2): 10 mg via ORAL
  Filled 2018-06-02 (×2): qty 1

## 2018-06-02 MED ORDER — FERROUS SULFATE 325 (65 FE) MG PO TABS
325.0000 mg | ORAL_TABLET | Freq: Every day | ORAL | Status: DC
  Administered 2018-06-02 – 2018-06-03 (×2): 325 mg via ORAL
  Filled 2018-06-02 (×2): qty 1

## 2018-06-02 MED ORDER — INSULIN ASPART 100 UNIT/ML ~~LOC~~ SOLN: 0.0000 [IU] | Freq: Every day | SUBCUTANEOUS | Status: DC

## 2018-06-02 MED ORDER — VITAMIN D 25 MCG (1000 UNIT) PO TABS
1000.0000 [IU] | ORAL_TABLET | Freq: Every day | ORAL | Status: DC
  Administered 2018-06-02 – 2018-06-03 (×2): 1000 [IU] via ORAL
  Filled 2018-06-02 (×2): qty 1

## 2018-06-02 MED ORDER — ACETAMINOPHEN 325 MG PO TABS: 650.0000 mg | ORAL_TABLET | Freq: Four times a day (QID) | ORAL | Status: DC | PRN

## 2018-06-02 NOTE — ED Notes (Signed)
Report called to Raymond Gurney floor nurse

## 2018-06-02 NOTE — Progress Notes (Signed)
Pharmacy Antibiotic Note  [name] is a 84 y.o. female admitted on 06/01/2018 with cellulitis.  Pharmacy has been consulted for meropenem dosing.  Plan: Will start meropenem 1g IV q12h per CrCl 26 - 50 ml/min  Height: 5\' 6"  (167.6 cm) Weight: 173 lb (78.5 kg) IBW/kg (Calculated) : 59.3  Temp (24hrs), Avg:97.8 F (36.6 C), Min:97.8 F (36.6 C), Max:97.8 F (36.6 C)  Recent Labs  Lab 06/01/18 1624 06/01/18 1632  WBC 7.0  --   CREATININE 0.97  --   LATICACIDVEN  --  1.83    Estimated Creatinine Clearance: 40 mL/min (by C-G formula based on SCr of 0.97 mg/dL).    Allergies  Allergen Reactions  . Ceftriaxone Swelling  . Cefuroxime Hives  . Cephalosporins Swelling and Other (See Comments)    Reaction:  Fainting/dry mouth   . Codeine Nausea And Vomiting  . Epinephrine Other (See Comments)    Reaction:  Fainting   . Morphine And Related Other (See Comments)    Reaction:  GI upset   . Buprenorphine Hcl Nausea And Vomiting  . Ciprofloxacin Itching, Swelling and Rash  . Latex Rash  . Lidocaine Rash  . Procaine Rash    Thank you for allowing pharmacy to be a part of this patient's care.  Tobie Lords, PharmD, BCPS Clinical Pharmacist 06/02/2018

## 2018-06-02 NOTE — Evaluation (Signed)
Physical Therapy Evaluation Patient Details [name] MRN: [id number] DOB: 09-Jun-1925 Today's Date: 06/02/2018   History of Present Illness  Patient is a 84 year old female admitted from home with cellulitis of left LE. Dyspnea. PMH to include CHF, Afib, Fall, DM ,HTN, HLD, CHF, COPD.     Clinical Impression  Patient received in bed with NA present having just put patient on bedpan. Patient reports she doesn't think this will work, therefore offered to assist her to [facility] for BM. Patient able to perform supine>< sit with supervision and use of bed rails. Transferred sit to stand with min + 2 assist. Ambulated to [facility] 5 feet with min A. Patient more fatigued when returning back to bed requiring increased assist. Patient will benefit from skilled PT while at [facility] to improve functional independence and improve strength for return home with assistance.     Follow Up Recommendations Home health PT    Equipment Recommendations  None recommended by PT    Recommendations for Other Services       Precautions / Restrictions Precautions Precautions: Fall Restrictions Weight Bearing Restrictions: No Other Position/Activity Restrictions: post op shoe on left LE due to existing wound      Mobility  Bed Mobility Overal bed mobility: Modified Independent             General bed mobility comments: used bed rails to assist with supine > sit and required increased time  Transfers Overall transfer level: Needs assistance Equipment used: Rolling walker (2 wheeled) Transfers: Sit to/from Stand Sit to Stand: Mod assist         General transfer comment: pt required verbal cues on hand placement pushing from the bed  Ambulation/Gait Ambulation/Gait assistance: Min assist;+2 physical assistance Gait Distance (Feet): 5 Feet Assistive device: Rolling walker (2 wheeled) Gait Pattern/deviations: Step-to pattern;Shuffle        Stairs            Wheelchair Mobility     Modified Rankin (Stroke Patients Only)       Balance Overall balance assessment: Mild deficits observed, not formally tested                                           Pertinent Vitals/Pain Pain Assessment: No/denies pain    Home Living Family/patient expects to be discharged to:: Private residence Living Arrangements: Spouse/significant other Available Help at Discharge: Family;Available PRN/intermittently;Personal care attendant Type of Home: House Home Access: Stairs to enter Entrance Stairs-Rails: Right Entrance Stairs-Number of Steps: 4 Home Layout: One level;Other (Comment) Home Equipment: Walker - 2 wheels;Shower seat Additional Comments: Denies BSC, record states she has a ramp at one entrance    Prior Function Level of Independence: Needs assistance   Gait / Transfers Assistance Needed: Ambulates household distances with rolling walker. reports she has fallen 2-3 x in the last 6 months  ADL's / Homemaking Assistance Needed: has caretakers assist with ADLs  Comments: Assist from aide as needed for ADLs; RW for ambulation within the home, 4WRW for limited ambulation within the community. Personal care attendent 3 hours in AM, 3 hours in PM for bathing assist, housekeeping.     Hand Dominance   Dominant Hand: Right    Extremity/Trunk Assessment   Upper Extremity Assessment Upper Extremity Assessment: Overall WFL for tasks assessed    Lower Extremity Assessment Lower Extremity Assessment: Overall WFL for  tasks assessed       Communication   Communication: HOH  Cognition Arousal/Alertness: Awake/alert Behavior During Therapy: WFL for tasks assessed/performed Overall Cognitive Status: Within Functional Limits for tasks assessed                                        General Comments      Exercises     Assessment/Plan    PT Assessment Patient needs continued PT services  PT Problem List Decreased  strength;Decreased mobility;Decreased safety awareness;Decreased activity tolerance;Decreased balance       PT Treatment Interventions Gait training;Therapeutic exercise;Stair training;Functional mobility training;Balance training;Therapeutic activities    PT Goals (Current goals can be found in the Care Plan section)  Acute Rehab PT Goals Patient Stated Goal: to get back home PT Goal Formulation: With patient Time For Goal Achievement: 06/16/18 Potential to Achieve Goals: Good    Frequency Min 2X/week   Barriers to discharge        Co-evaluation               AM-PAC PT "6 Clicks" Mobility  Outcome Measure Help needed turning from your back to your side while in a flat bed without using bedrails?: A Little Help needed moving from lying on your back to sitting on the side of a flat bed without using bedrails?: A Little Help needed moving to and from a bed to a chair (including a wheelchair)?: A Little Help needed standing up from a chair using your arms (e.g., wheelchair or bedside chair)?: A Little Help needed to walk in hospital room?: A Lot Help needed climbing 3-5 steps with a railing? : Total 6 Click Score: 15    End of Session Equipment Utilized During Treatment: Gait belt Activity Tolerance: Patient limited by fatigue Patient left: in bed;with bed alarm set;with call bell/phone within reach;with family/visitor present Nurse Communication: Mobility status PT Visit Diagnosis: Other abnormalities of gait and mobility (R26.89);Muscle weakness (generalized) (M62.81);History of falling (Z91.81);Unsteadiness on feet (R26.81)    Time: 5462-7035 PT Time Calculation (min) (ACUTE ONLY): 23 min   Charges:   PT Evaluation $PT Eval Moderate Complexity: 1 Mod PT Treatments $Gait Training: 8-22 mins $Therapeutic Activity: 8-22 mins        Nyoka Alcoser, PT, GCS 06/02/18,12:06 PM

## 2018-06-02 NOTE — ED Notes (Signed)
Pt and family updated about admission status.

## 2018-06-02 NOTE — Consult Note (Signed)
[facility]                                                      Patient Demographics  [name], is a 84 y.o. female   MRN: [id number]   DOB - 1924/07/03  Admit Date - 06/01/2018    Outpatient Primary MD for the patient is Tracie Harrier, MD  Consult requested in the Hospital by Nicholes Mango, MD, On 06/02/2018    Reason for consult Diabetic foot infection left great toe   With History of -  Past Medical History:  Diagnosis Date  . Cancer ([place])    skin ca  . CHF (congestive heart failure) ([place])   . Chronic back pain   . COPD (chronic obstructive pulmonary disease) ([place])   . Diabetes mellitus without complication ([place])   . Hypertension   . Poor perfusion of leg       Past Surgical History:  Procedure Laterality Date  . ABDOMINAL HYSTERECTOMY    . BACK SURGERY     x3  . CORONARY STENT PLACEMENT     unknown location per pt  . FOOT SURGERY    . LEG SURGERY    . PACEMAKER PLACEMENT    . TONSILLECTOMY      in for   Chief Complaint  Patient presents with  . Cellulitis  . Leg Swelling     HPI  [name]  is a 84 y.o. female, patient had ulceration on her left great toe last week that was red and inflamed and draining.  Cultures were not available until yesterday she is growing both Proteus and a Pseudomonas which were both resistant to doxycycline which was the medicine she was on for a pre-existing UTI.  She is allergic to Cipro so fluoroquinolones were not available those and only oral antibiotics used to treat it with.  Subsequently we referred her to internal medicine after our clinic who sent her to the emergency room because of that as well as cellulitis in her leg and some shortness of breath.    Review of Systems    In addition to the HPI above,  No Fever-chills, No Headache, No changes with Vision or hearing, No  problems swallowing food or Liquids, No Chest pain, Cough but patient has some shortness of breath and has had a recent bout with pneumonia No Abdominal pain, No Nausea or Vommitting, Bowel movements are regular, No Blood in stool or Urine, Recent UTI was on doxycycline and feels like she still little better with that now. No new skin rashes or bruises, No new joints pains-aches, patient does have a fall risk No new weakness, tingling, chronic history of peripheral neuropathy and numbness to both feet No recent weight gain or loss, No polyuria, polydypsia or polyphagia, No significant Mental Stressors.  A full 10 point Review of Systems was done, except as stated above, all other Review of Systems were negative.   Social History Social History   Tobacco Use  . Smoking status: Never Smoker  . Smokeless tobacco: Never Used  Substance Use Topics  . Alcohol use: No    Alcohol/week: 0.0 standard drinks    Family History Family History  Problem Relation Age of Onset  . CVA Mother        Congestive Heart Failure, heart attack, hypertension, stroke  .  Diabetes Mellitus II Sister   . Heart disease Father        Congestive Heart Failure, heart attack, hypertension    Prior to Admission medications   Medication Sig Start Date End Date Taking? Authorizing Provider  aspirin EC 81 MG tablet Take 81 mg by mouth daily.   Yes [provider]  baclofen (LIORESAL) 10 MG tablet Take 10 mg by mouth 3 (three) times daily as needed.    Yes [provider]  carvedilol (COREG) 6.25 MG tablet Take 6.25 mg by mouth 2 (two) times daily with a meal.    Yes [provider]  cholecalciferol (VITAMIN D) 1000 units tablet Take 1,000 Units by mouth daily.   Yes [provider]  docusate sodium (COLACE) 100 MG capsule Take 100 mg by mouth 2 (two) times daily.    Yes [provider]  escitalopram (LEXAPRO) 10 MG tablet Take 10 mg by mouth daily.   Yes [provider]  ferrous sulfate 325 (65 FE) MG tablet Take 325 mg by mouth daily with breakfast.   Yes [provider]  furosemide (LASIX) 40 MG tablet Take 0.5 tablets (20 mg total) by mouth daily. 05/18/18  Yes Salary, Avel Peace, MD  gabapentin (NEURONTIN) 100 MG capsule Take 100-200 mg by mouth at bedtime.   Yes [provider]  glimepiride (AMARYL) 4 MG tablet Take 4 mg by mouth 2 (two) times daily. 07/01/17  Yes [provider]  magnesium oxide (MAG-OX) 400 MG tablet Take 400 mg by mouth daily.   Yes [provider]  Melatonin 1 MG TABS Take 1 mg by mouth at bedtime.   Yes [provider]  Multiple Vitamin (MULTIVITAMIN) tablet Take 1 tablet by mouth daily.   Yes [provider]  oxycodone (OXY-IR) 5 MG capsule Take 5 mg by mouth every 8 (eight) hours as needed.   Yes [provider]  pantoprazole (PROTONIX) 40 MG tablet Take 40 mg by mouth daily.   Yes [provider]  PROAIR HFA 108 (90 Base) MCG/ACT inhaler Inhale 2 puffs into the lungs every 6 (six) hours as needed for wheezing or shortness of breath.  10/02/17  Yes [provider]  Rivaroxaban (XARELTO) 15 MG TABS tablet Take 15 mg by mouth daily.   Yes [provider]  rOPINIRole (REQUIP) 0.5 MG tablet Take 0.5 mg by mouth 2 (two) times daily.   Yes [provider]  sitaGLIPtin (JANUVIA) 100 MG tablet Take 100 mg by mouth daily. 12/06/16  Yes [provider]  spironolactone (ALDACTONE) 25 MG tablet Take 0.5 tablets (12.5 mg total) by mouth daily. 05/18/18  Yes Salary, Avel Peace, MD  vitamin B-12 (CYANOCOBALAMIN) 1000 MCG tablet Take 1,000 mcg by mouth daily.   Yes [provider]  vitamin C (ASCORBIC ACID) 500 MG tablet Take 500 mg by mouth daily.   Yes [provider]  acetaminophen (TYLENOL) 325 MG tablet Take 2 tablets (650 mg total) by mouth every 6 (six) hours as needed for mild pain (or Fever >/= 101). Patient  not taking: Reported on 06/02/2018 04/18/17   Nicholes Mango, MD  buPROPion (WELLBUTRIN XL) 150 MG 24 hr tablet Take 1 tablet by mouth daily. 11/28/17   [provider]  feeding supplement, ENSURE ENLIVE, (ENSURE ENLIVE) LIQD Take 237 mLs by mouth 2 (two) times daily between meals. 12/15/17   Bettey Costa, MD  metFORMIN (GLUCOPHAGE) 500 MG tablet Take 1 tablet by mouth daily. 12/10/17  [provider]  Vitamin D, Ergocalciferol, (DRISDOL) 50000 UNITS CAPS capsule Take 50,000 Units by mouth every 7 (seven) days. Pt takes on Thursday.    [provider]    Anti-infectives (From admission, onward)   Start     Dose/Rate Route Frequency Ordered Stop   06/02/18 0145  meropenem (MERREM) 1 g in sodium chloride 0.9 % 100 mL IVPB     1 g 200 mL/hr over 30 Minutes Intravenous Every 12 hours 06/02/18 0133     06/02/18 0045  clindamycin (CLEOCIN) IVPB 600 mg     600 mg 100 mL/hr over 30 Minutes Intravenous Every 8 hours 06/02/18 0031     06/02/18 0045  meropenem (MERREM) 1 g in sodium chloride 0.9 % 100 mL IVPB  Status:  Discontinued     1 g 200 mL/hr over 30 Minutes Intravenous Every 8 hours 06/02/18 0031 06/02/18 0133   06/01/18 1945  gentamicin (GARAMYCIN) 200 mg in dextrose 5 % 50 mL IVPB     2.5 mg/kg  78.5 kg 110 mL/hr over 30 Minutes Intravenous  Once 06/01/18 1938 06/01/18 2153      Scheduled Meds: . aspirin EC  81 mg Oral Daily  . buPROPion  150 mg Oral Daily  . carvedilol  6.25 mg Oral BID WC  . cholecalciferol  1,000 Units Oral Daily  . escitalopram  10 mg Oral Daily  . feeding supplement (ENSURE ENLIVE)  237 mL Oral BID BM  . ferrous sulfate  325 mg Oral Q breakfast  . [START ON 06/03/2018] furosemide  20 mg Oral Daily  . gabapentin  100-200 mg Oral QHS  . insulin aspart  0-15 Units Subcutaneous TID WC  . insulin aspart  0-5 Units Subcutaneous QHS  . magnesium oxide  400 mg Oral Daily  . Melatonin  2.5 mg Oral QHS  . multivitamin with minerals  1 tablet Oral  Daily  . pantoprazole  40 mg Oral Daily  . Rivaroxaban  15 mg Oral Q supper  . rOPINIRole  0.5 mg Oral BID  . [START ON 06/03/2018] spironolactone  12.5 mg Oral Daily  . vitamin B-12  1,000 mcg Oral Daily  . vitamin C  500 mg Oral Daily   Continuous Infusions: . clindamycin (CLEOCIN) IV Stopped (06/02/18 0219)  . meropenem (MERREM) IV     PRN Meds:.acetaminophen **OR** acetaminophen, albuterol, baclofen, bisacodyl, HYDROmorphone (DILAUDID) injection, ondansetron **OR** ondansetron (ZOFRAN) IV, oxyCODONE, senna-docusate  Allergies  Allergen Reactions  . Ceftriaxone Swelling  . Cefuroxime Hives  . Cephalosporins Swelling and Other (See Comments)    Reaction:  Fainting/dry mouth   . Codeine Nausea And Vomiting  . Epinephrine Other (See Comments)    Reaction:  Fainting   . Morphine And Related Other (See Comments)    Reaction:  GI upset   . Buprenorphine Hcl Nausea And Vomiting  . Ciprofloxacin Itching, Swelling and Rash  . Latex Rash  . Lidocaine Rash  . Procaine Rash    Physical Exam  Vitals  Blood pressure 139/65, pulse 75, temperature 97.7 F (36.5 C), resp. rate 20, height 5\' 6"  (1.676 m), weight 78.5 kg, SpO2 97 %.  Lower Extremity exam:  Vascular: Difficult to palpate bilaterally  Dermatological: Ulceration on left great toe at the IP joint region.  Is approximately 2 and half centimeters in length and 8 mm in width with 3 mm of depth.  Compared to last week it does look a little bit better at this point likely helped by  the IV antibiotics started last night.  Cellulitis is diminished to the toe no heavy drainage is noted. Patient does have redness cellulitis and swelling to the left lower leg due to the venous stasis lymphedema that she has. Neurological: Significant peripheral neuropathy  Ortho: Patient had osteomyelitis in that left foot had removed some bone from the first metatarsal subsequently she developed hallux malleus deformity which causes some rubbing  with her shoes from time to time.  Data Review  CBC Recent Labs  Lab 06/01/18 1624  WBC 7.0  HGB 11.1*  HCT 36.5  PLT 226  MCV 97.9  MCH 29.8  MCHC 30.4  RDW 14.6  LYMPHSABS 1.0  MONOABS 0.5  EOSABS 0.3  BASOSABS 0.1   ------------------------------------------------------------------------------------------------------------------  Chemistries  Recent Labs  Lab 06/01/18 1624  NA 140  K 4.6  CL 111  CO2 23  GLUCOSE 206*  BUN 27*  CREATININE 0.97  CALCIUM 8.8*   ------------------------------------------------------------------------------------------------------------------ estimated creatinine clearance is 40 mL/min (by C-G formula based on SCr of 0.97 mg/dL). ------------------------------------------------------------------------------------------------------------------ No results for input(s): TSH, T4TOTAL, T3FREE, THYROIDAB in the last 72 hours.  Invalid input(s): FREET3 Urinalysis    Component Value Date/Time   COLORURINE YELLOW (A) 05/16/2018 2125   APPEARANCEUR CLEAR (A) 05/16/2018 2125   APPEARANCEUR Clear 08/19/2013 1455   LABSPEC 1.014 05/16/2018 2125   LABSPEC 1.019 08/19/2013 1455   PHURINE 5.0 05/16/2018 2125   GLUCOSEU 50 (A) 05/16/2018 2125   GLUCOSEU Negative 08/19/2013 1455   HGBUR NEGATIVE 05/16/2018 2125   BILIRUBINUR NEGATIVE 05/16/2018 2125   BILIRUBINUR Negative 08/19/2013 1455   KETONESUR NEGATIVE 05/16/2018 2125   PROTEINUR NEGATIVE 05/16/2018 2125   NITRITE NEGATIVE 05/16/2018 2125   LEUKOCYTESUR NEGATIVE 05/16/2018 2125   LEUKOCYTESUR 3+ 08/19/2013 1455     Imaging results:   Dg Chest 2 View  Result Date: 06/01/2018 CLINICAL DATA:  Shortness of breath EXAM: CHEST - 2 VIEW COMPARISON:  05/16/2018, Apr 04, 202019 FINDINGS: Left-sided pacing device as before. Mild cardiomegaly with aortic atherosclerosis. No acute airspace disease. No pleural effusion. Hyperinflation. Surgical hardware within the thoracolumbar spine. No  pneumothorax. IMPRESSION: No active cardiopulmonary disease.  Mild cardiomegaly. Electronically Signed   By: Donavan Foil M.D.   On: 06/01/2018 17:06   Ct Angio Chest Pe W And/or Wo Contrast  Result Date: 06/01/2018 CLINICAL DATA:  Acute shortness of breath. Bilateral leg swelling. EXAM: CT ANGIOGRAPHY CHEST WITH CONTRAST TECHNIQUE: Multidetector CT imaging of the chest was performed using the standard protocol during bolus administration of intravenous contrast. Multiplanar CT image reconstructions and MIPs were obtained to evaluate the vascular anatomy. CONTRAST:  20mL OMNIPAQUE IOHEXOL 350 MG/ML SOLN COMPARISON:  Chest radiographs obtained earlier today. Chest CT dated 03/15/2016. FINDINGS: Cardiovascular: Normally opacified pulmonary arteries with no pulmonary arterial filling defects seen. Atheromatous calcifications, including the coronary arteries and aorta. Enlarged heart, most pronounced involving the right atrium followed by the left atrium. Mediastinum/Nodes: Enlarged subcarinal node with a short axis diameter of 23 mm on image number 132 series 6. Mildly enlarged right hilar nodes, including a node with a short axis diameter of 10 mm on image number 140 series 6. Multiple enlarged superior mediastinal nodes, including a right anterior paratracheal node with a short axis diameter of 19 mm on image number 94 series 6. Unremarkable thyroid gland and esophagus. Lungs/Pleura: 8 x 3 mm subpleural nodule in the left lower lobe on image number 53 series 5, unchanged. 8 mm right lower lobe nodule on image number 73 series 5,  unchanged. A previously demonstrated 7 mm right lower lobe nodule is no longer visualized. A previously demonstrated smaller right lower lobe nodule is also no longer visualized. Mild patchy ground-glass opacity scattered in both lungs with mild progression. Mild increase in prominence of the interstitial markings in both lower lung zones with septal thickening. No pleural fluid. Upper  Abdomen: Reflux of contrast into the hepatic veins and inferior vena cava. Artifacts produced by lumbar spine fixation hardware. Musculoskeletal: Stable thoracolumbar spine fixation hardware and T10 vertebral body old compression deformity with mild bony retropulsion. Thoracic and lower cervical spine degenerative changes. Review of the MIP images confirms the above findings. IMPRESSION: 1. No pulmonary emboli. 2. Mild changes of congestive heart failure with right heart failure. 3. Mildly progressive mediastinal and right hilar adenopathy, most likely reactive. 4. Stable bilateral lower lobe pulmonary nodules, previously stable since 2016. The long-term stability is compatible with a benign process. 5.  Calcific coronary artery and aortic atherosclerosis. Aortic Atherosclerosis (ICD10-I70.0). Electronically Signed   By: Claudie Revering M.D.   On: 06/01/2018 22:40   US Venous Img Lower Unilateral Left  Result Date: 06/01/2018 CLINICAL DATA:  Left lower extremity pain and edema EXAM: Left LOWER EXTREMITY VENOUS DOPPLER ULTRASOUND TECHNIQUE: Gray-scale sonography with graded compression, as well as color Doppler and duplex ultrasound were performed to evaluate the lower extremity deep venous systems from the level of the common femoral vein and including the common femoral, femoral, profunda femoral, popliteal and calf veins including the posterior tibial, peroneal and gastrocnemius veins when visible. The superficial great saphenous vein was also interrogated. Spectral Doppler was utilized to evaluate flow at rest and with distal augmentation maneuvers in the common femoral, femoral and popliteal veins. COMPARISON:  04/14/2017 FINDINGS: Contralateral Common Femoral Vein: Respiratory phasicity is normal and symmetric with the symptomatic side. No evidence of thrombus. Normal compressibility. Common Femoral Vein: No evidence of thrombus. Normal compressibility, respiratory phasicity and response to augmentation.  Saphenofemoral Junction: No evidence of thrombus. Normal compressibility and flow on color Doppler imaging. Profunda Femoral Vein: No evidence of thrombus. Normal compressibility and flow on color Doppler imaging. Femoral Vein: No evidence of thrombus. Normal compressibility, respiratory phasicity and response to augmentation. Popliteal Vein: No evidence of thrombus. Normal compressibility, respiratory phasicity and response to augmentation. Calf Veins: Poorly visualized. Other Findings:  None. IMPRESSION: No evidence of deep venous thrombosis. Electronically Signed   By: Donavan Foil M.D.   On: 06/01/2018 20:51   Dg Knee Complete 4 Views Right  Result Date: 06/01/2018 CLINICAL DATA:  Pain, post fall swelling and bruising EXAM: RIGHT KNEE - COMPLETE 4+ VIEW COMPARISON:  07/15/2017 FINDINGS: No fracture or malalignment. Moderate degenerative change medial compartment. No significant knee effusion. Vascular calcifications. IMPRESSION: No acute osseous abnormality.  Moderate arthritis. Electronically Signed   By: Donavan Foil M.D.   On: 06/01/2018 20:03    Assessment & Plan: Start a wet-to-dry saline dressing for on the great toe a change at this morning at 7:30 AM.  I think that if she gets the appropriate antibiotics for today she is on both meropenem and clindamycin IV we might build let her go home tomorrow with dressing change orders.  She is okay right now with bathroom privileges with assistance and using a walker.  She has a postoperative shoe in the room that needs to be on as well.  Active Problems:   Cellulitis of left lower extremity   Family Communication: Plan discussed with patient  Albertine Patricia M.D on  06/02/2018 at 7:45 AM  Thank you for the consult, we will follow the patient with you in the Hospital.

## 2018-06-02 NOTE — H&P (Addendum)
[facility] [name]    MR#:  [id number]  DATE OF BIRTH:  Aug 10, 1924  DATE OF ADMISSION:  06/01/2018  PRIMARY CARE PHYSICIAN: Tracie Harrier, MD   REQUESTING/REFERRING PHYSICIAN: Eula Listen, MD  CHIEF COMPLAINT:   Chief Complaint  Patient presents with  . Cellulitis  . Leg Swelling    HISTORY OF PRESENT ILLNESS:  [name]  is a 84 y.o. female with a known history of CHF (s/p PPM), Afib (Xarelto), T2NIDDM, HTN, HLD, Hx chronic diastolic CHF/HFpEF (EF 58-52% as of 12/14/2017 Echo; grade 1 diastolic dysfxn as of 77/82/4235 Echo) p/w LLE cellulitis. AAOx3, weak but able to provide Hx. Daughter at bedside helps. Recent admit for AKI on CKD 2/2 dehydration, D/C 11/25. Follows w/ Podiatry (Dr. Elvina Mattes) as outpt. Tx for L great toe infxn as outpt, failed Tx. 2d Hx worsening erythema/swelling of LLE, up to knee. No obvious joint involvement. (+) TTP, (-) F/C/diaph/rigors. Recent fall, R knee pain. Frequent falls, walks w/ walker at baseline. DNR/DNI. Says she cannot have MRI, has PPM.   PAST MEDICAL HISTORY:   Past Medical History:  Diagnosis Date  . Cancer ([place])    skin ca  . CHF (congestive heart failure) ([place])   . Chronic back pain   . COPD (chronic obstructive pulmonary disease) ([place])   . Diabetes mellitus without complication ([place])   . Hypertension   . Poor perfusion of leg     PAST SURGICAL HISTORY:   Past Surgical History:  Procedure Laterality Date  . ABDOMINAL HYSTERECTOMY    . BACK SURGERY     x3  . CORONARY STENT PLACEMENT     unknown location per pt  . FOOT SURGERY    . LEG SURGERY    . PACEMAKER PLACEMENT    . TONSILLECTOMY      SOCIAL HISTORY:   Social History   Tobacco Use  . Smoking status: Never Smoker  . Smokeless tobacco: Never Used  Substance Use Topics  . Alcohol use: No    Alcohol/week: 0.0 standard drinks    FAMILY HISTORY:   Family History  Problem Relation  Age of Onset  . CVA Mother        Congestive Heart Failure, heart attack, hypertension, stroke  . Diabetes Mellitus II Sister   . Heart disease Father        Congestive Heart Failure, heart attack, hypertension    DRUG ALLERGIES:   Allergies  Allergen Reactions  . Ceftriaxone Swelling  . Cefuroxime Hives  . Cephalosporins Swelling and Other (See Comments)    Reaction:  Fainting/dry mouth   . Codeine Nausea And Vomiting  . Epinephrine Other (See Comments)    Reaction:  Fainting   . Morphine And Related Other (See Comments)    Reaction:  GI upset   . Buprenorphine Hcl Nausea And Vomiting  . Ciprofloxacin Itching, Swelling and Rash  . Latex Rash  . Lidocaine Rash  . Procaine Rash    REVIEW OF SYSTEMS:   Review of Systems  Constitutional: Positive for malaise/fatigue. Negative for chills, diaphoresis, fever and weight loss.  HENT: Negative for congestion, ear pain, hearing loss, nosebleeds, sinus pain, sore throat and tinnitus.   Eyes: Negative for blurred vision, double vision and photophobia.  Respiratory: Negative for cough, hemoptysis, sputum production, shortness of breath and wheezing.   Cardiovascular: Positive for leg swelling (+) L foot/leg swelling. Negative for chest pain, palpitations, orthopnea, claudication and PND.  Gastrointestinal: Negative for abdominal pain, blood in stool, constipation, diarrhea, heartburn, melena, nausea and vomiting.  Genitourinary: Negative for dysuria, frequency, hematuria and urgency.  Musculoskeletal: Positive for falls. Negative for back pain, joint pain, myalgias and neck pain.  Skin: Negative for itching.  Neurological: Positive for weakness. Negative for dizziness, tingling, tremors, sensory change, speech change, focal weakness, seizures, loss of consciousness and headaches.  Psychiatric/Behavioral: Negative for depression and memory loss. The patient is not nervous/anxious and does not have insomnia.    MEDICATIONS AT HOME:    Prior to Admission medications   Medication Sig Start Date End Date Taking? Authorizing Provider  aspirin EC 81 MG tablet Take 81 mg by mouth daily.   Yes [provider]  baclofen (LIORESAL) 10 MG tablet Take 10 mg by mouth 3 (three) times daily as needed.    Yes [provider]  carvedilol (COREG) 6.25 MG tablet Take 6.25 mg by mouth 2 (two) times daily with a meal.    Yes [provider]  cholecalciferol (VITAMIN D) 1000 units tablet Take 1,000 Units by mouth daily.   Yes [provider]  docusate sodium (COLACE) 100 MG capsule Take 100 mg by mouth 2 (two) times daily.    Yes [provider]  escitalopram (LEXAPRO) 10 MG tablet Take 10 mg by mouth daily.   Yes [provider]  ferrous sulfate 325 (65 FE) MG tablet Take 325 mg by mouth daily with breakfast.   Yes [provider]  furosemide (LASIX) 40 MG tablet Take 0.5 tablets (20 mg total) by mouth daily. 05/18/18  Yes Salary, Avel Peace, MD  gabapentin (NEURONTIN) 100 MG capsule Take 100-200 mg by mouth at bedtime.   Yes [provider]  glimepiride (AMARYL) 4 MG tablet Take 4 mg by mouth 2 (two) times daily. 07/01/17  Yes [provider]  magnesium oxide (MAG-OX) 400 MG tablet Take 400 mg by mouth daily.   Yes [provider]  Melatonin 1 MG TABS Take 1 mg by mouth at bedtime.   Yes [provider]  Multiple Vitamin (MULTIVITAMIN) tablet Take 1 tablet by mouth daily.   Yes [provider]  oxycodone (OXY-IR) 5 MG capsule Take 5 mg by mouth every 8 (eight) hours as needed.   Yes [provider]  pantoprazole (PROTONIX) 40 MG tablet Take 40 mg by mouth daily.   Yes [provider]  PROAIR HFA 108 (90 Base) MCG/ACT inhaler Inhale 2 puffs into the lungs every 6 (six) hours as needed for wheezing or shortness of breath.  10/02/17  Yes [provider]  Rivaroxaban (XARELTO) 15 MG TABS tablet Take 15 mg by mouth  daily.   Yes [provider]  rOPINIRole (REQUIP) 0.5 MG tablet Take 0.5 mg by mouth 2 (two) times daily.   Yes [provider]  sitaGLIPtin (JANUVIA) 100 MG tablet Take 100 mg by mouth daily. 12/06/16  Yes [provider]  spironolactone (ALDACTONE) 25 MG tablet Take 0.5 tablets (12.5 mg total) by mouth daily. 05/18/18  Yes Salary, Avel Peace, MD  vitamin B-12 (CYANOCOBALAMIN) 1000 MCG tablet Take 1,000 mcg by mouth daily.   Yes [provider]  vitamin C (ASCORBIC ACID) 500 MG tablet Take 500 mg by mouth daily.   Yes [provider]  acetaminophen (TYLENOL) 325 MG tablet Take 2 tablets (650 mg total) by mouth every 6 (six) hours as needed for mild pain (or Fever >/= 101). Patient not taking: Reported on 06/02/2018  04/18/17   Gouru, Illene Silver, MD  buPROPion (WELLBUTRIN XL) 150 MG 24 hr tablet Take 1 tablet by mouth daily. 11/28/17   [provider]  feeding supplement, ENSURE ENLIVE, (ENSURE ENLIVE) LIQD Take 237 mLs by mouth 2 (two) times daily between meals. 12/15/17   Bettey Costa, MD  metFORMIN (GLUCOPHAGE) 500 MG tablet Take 1 tablet by mouth daily. 12/10/17   [provider]  Vitamin D, Ergocalciferol, (DRISDOL) 50000 UNITS CAPS capsule Take 50,000 Units by mouth every 7 (seven) days. Pt takes on Thursday.    [provider]      VITAL SIGNS:  Blood pressure 139/65, pulse 75, temperature 97.7 F (36.5 C), resp. rate 20, height 5\' 6"  (1.676 m), weight 78.5 kg, SpO2 97 %.  PHYSICAL EXAMINATION:  Physical Exam  Constitutional: She is oriented to person, place, and time. She appears well-developed. She is active and cooperative. She has a sickly appearance. No distress. She is not intubated.  HENT:  Head: Atraumatic.  Mouth/Throat: Oropharynx is clear and moist. No oropharyngeal exudate.  Eyes: Conjunctivae, EOM and lids are normal. No scleral icterus.  Neck: Neck supple. No JVD present. No thyromegaly present.   Cardiovascular: Normal rate, regular rhythm, S1 normal, S2 normal and normal heart sounds.  No extrasystoles are present. Exam reveals no gallop, no S3, no S4, no distant heart sounds and no friction rub.  No murmur heard. Pulmonary/Chest: Effort normal. No accessory muscle usage or stridor. No apnea, no tachypnea and no bradypnea. She is not intubated. No respiratory distress. She has decreased breath sounds in the right upper field, the right middle field, the right lower field, the left upper field, the left middle field and the left lower field. She has no wheezes. She has no rhonchi. She has no rales.  (+) PPM.  Abdominal: Soft. She exhibits no distension. Bowel sounds are decreased. There is no tenderness. There is no rigidity, no rebound and no guarding.  Musculoskeletal: Normal range of motion. She exhibits edema (+) LLE erythema/swelling/TTP and tenderness (+) LLE erythema/swelling/TTP.  Lymphadenopathy:    She has no cervical adenopathy.  Neurological: She is alert and oriented to person, place, and time. She is not disoriented.  Speaks weakly w/ soft voice. AAOx3, good memory/cognition.  Skin: Skin is warm and dry. She is not diaphoretic. No erythema.  Psychiatric: She has a normal mood and affect. Her speech is normal and behavior is normal. Judgment and thought content normal. Cognition and memory are normal.   LABORATORY PANEL:   CBC Recent Labs  Lab 06/01/18 1624  WBC 7.0  HGB 11.1*  HCT 36.5  PLT 226   ------------------------------------------------------------------------------------------------------------------  Chemistries  Recent Labs  Lab 06/01/18 1624  NA 140  K 4.6  CL 111  CO2 23  GLUCOSE 206*  BUN 27*  CREATININE 0.97  CALCIUM 8.8*   ------------------------------------------------------------------------------------------------------------------  Cardiac Enzymes Recent Labs  Lab 06/01/18 1624  TROPONINI <0.03    ------------------------------------------------------------------------------------------------------------------  RADIOLOGY:  Dg Chest 2 View  Result Date: 06/01/2018 CLINICAL DATA:  Shortness of breath EXAM: CHEST - 2 VIEW COMPARISON:  05/16/2018, December 17, 202019 FINDINGS: Left-sided pacing device as before. Mild cardiomegaly with aortic atherosclerosis. No acute airspace disease. No pleural effusion. Hyperinflation. Surgical hardware within the thoracolumbar spine. No pneumothorax. IMPRESSION: No active cardiopulmonary disease.  Mild cardiomegaly. Electronically Signed   By: Donavan Foil M.D.   On: 06/01/2018 17:06   Ct Angio Chest Pe W And/or Wo Contrast  Result Date: 06/01/2018 CLINICAL DATA:  Acute shortness  of breath. Bilateral leg swelling. EXAM: CT ANGIOGRAPHY CHEST WITH CONTRAST TECHNIQUE: Multidetector CT imaging of the chest was performed using the standard protocol during bolus administration of intravenous contrast. Multiplanar CT image reconstructions and MIPs were obtained to evaluate the vascular anatomy. CONTRAST:  66mL OMNIPAQUE IOHEXOL 350 MG/ML SOLN COMPARISON:  Chest radiographs obtained earlier today. Chest CT dated 03/15/2016. FINDINGS: Cardiovascular: Normally opacified pulmonary arteries with no pulmonary arterial filling defects seen. Atheromatous calcifications, including the coronary arteries and aorta. Enlarged heart, most pronounced involving the right atrium followed by the left atrium. Mediastinum/Nodes: Enlarged subcarinal node with a short axis diameter of 23 mm on image number 132 series 6. Mildly enlarged right hilar nodes, including a node with a short axis diameter of 10 mm on image number 140 series 6. Multiple enlarged superior mediastinal nodes, including a right anterior paratracheal node with a short axis diameter of 19 mm on image number 94 series 6. Unremarkable thyroid gland and esophagus. Lungs/Pleura: 8 x 3 mm subpleural nodule in the left lower lobe on  image number 53 series 5, unchanged. 8 mm right lower lobe nodule on image number 73 series 5, unchanged. A previously demonstrated 7 mm right lower lobe nodule is no longer visualized. A previously demonstrated smaller right lower lobe nodule is also no longer visualized. Mild patchy ground-glass opacity scattered in both lungs with mild progression. Mild increase in prominence of the interstitial markings in both lower lung zones with septal thickening. No pleural fluid. Upper Abdomen: Reflux of contrast into the hepatic veins and inferior vena cava. Artifacts produced by lumbar spine fixation hardware. Musculoskeletal: Stable thoracolumbar spine fixation hardware and T10 vertebral body old compression deformity with mild bony retropulsion. Thoracic and lower cervical spine degenerative changes. Review of the MIP images confirms the above findings. IMPRESSION: 1. No pulmonary emboli. 2. Mild changes of congestive heart failure with right heart failure. 3. Mildly progressive mediastinal and right hilar adenopathy, most likely reactive. 4. Stable bilateral lower lobe pulmonary nodules, previously stable since 2016. The long-term stability is compatible with a benign process. 5.  Calcific coronary artery and aortic atherosclerosis. Aortic Atherosclerosis (ICD10-I70.0). Electronically Signed   By: Claudie Revering M.D.   On: 06/01/2018 22:40   US Venous Img Lower Unilateral Left  Result Date: 06/01/2018 CLINICAL DATA:  Left lower extremity pain and edema EXAM: Left LOWER EXTREMITY VENOUS DOPPLER ULTRASOUND TECHNIQUE: Gray-scale sonography with graded compression, as well as color Doppler and duplex ultrasound were performed to evaluate the lower extremity deep venous systems from the level of the common femoral vein and including the common femoral, femoral, profunda femoral, popliteal and calf veins including the posterior tibial, peroneal and gastrocnemius veins when visible. The superficial great saphenous vein was  also interrogated. Spectral Doppler was utilized to evaluate flow at rest and with distal augmentation maneuvers in the common femoral, femoral and popliteal veins. COMPARISON:  04/14/2017 FINDINGS: Contralateral Common Femoral Vein: Respiratory phasicity is normal and symmetric with the symptomatic side. No evidence of thrombus. Normal compressibility. Common Femoral Vein: No evidence of thrombus. Normal compressibility, respiratory phasicity and response to augmentation. Saphenofemoral Junction: No evidence of thrombus. Normal compressibility and flow on color Doppler imaging. Profunda Femoral Vein: No evidence of thrombus. Normal compressibility and flow on color Doppler imaging. Femoral Vein: No evidence of thrombus. Normal compressibility, respiratory phasicity and response to augmentation. Popliteal Vein: No evidence of thrombus. Normal compressibility, respiratory phasicity and response to augmentation. Calf Veins: Poorly visualized. Other Findings:  None. IMPRESSION: No evidence  of deep venous thrombosis. Electronically Signed   By: Donavan Foil M.D.   On: 06/01/2018 20:51   Dg Knee Complete 4 Views Right  Result Date: 06/01/2018 CLINICAL DATA:  Pain, post fall swelling and bruising EXAM: RIGHT KNEE - COMPLETE 4+ VIEW COMPARISON:  07/15/2017 FINDINGS: No fracture or malalignment. Moderate degenerative change medial compartment. No significant knee effusion. Vascular calcifications. IMPRESSION: No acute osseous abnormality.  Moderate arthritis. Electronically Signed   By: Donavan Foil M.D.   On: 06/01/2018 20:03   IMPRESSION AND PLAN:   A/P: 45F LLE cellulitis, failed outpt Tx, SIRS (-). Hyperglycemia (w/ T2NIDDM), mild dehydration, mild normocytic anemia. Falls. -LLE cellulitis: SIRS (-). Meropenem + Clindamycin based on prior Cx and recent outpt ABx. Consider consult to Podiatry, ID for stewardship. Says she cannot have MRI (2/2 PPM). May need bone scan if we are to consider evaluating for  osteomyelitis. Pain ctrl. -Hyperglycemia, T2NIDDM: SSI. Hold home agents. -Dehydration: Gentle IVF. Good EF. -Normocytic anemia: Mild. Likely anemia of chronic disease. No evidence of acute/active blood loss at present. -Falls: Fall precautions. P/T eval. -c/w home meds/formulary subs. -FEN/GI: Renal diabetic diet. -DVT PPx: Xarelto. -Code status: DNR/DNI. -Disposition: Observation, < 2 midnights.   All the records are reviewed and case discussed with ED provider. Management plans discussed with the patient, family and they are in agreement.  CODE STATUS: DNR/DNI.  TOTAL TIME TAKING CARE OF THIS PATIENT: 75 minutes.    Arta Silence M.D on 06/02/2018 at 2:53 AM  Between 7am to 6pm - Pager - [phone]  After 6pm go to [email or web address] - Proofreader  [facility]  Office  [phone]  CC: Primary care physician; Tracie Harrier, MD   Note: This dictation was prepared with Dragon dictation along with smaller phrase technology. Any transcriptional errors that result from this process are unintentional.

## 2018-06-02 NOTE — Care Management Obs Status (Signed)
[facility] NOTIFICATION   Patient Details  [name] MRN: [id number] Date of Birth: 31-Mar-1925   Medicare Observation Status Notification Given:  Yes    Marshell Garfinkel, RN 06/02/2018, 9:44 AM

## 2018-06-02 NOTE — Care Management (Signed)
Please add these orders to home health plan: Assessment & Plan: Start a wet-to-dry saline dressing for on the great toe a change at this morning at 7:30 AM.  I think that if she gets the appropriate antibiotics for today she is on both meropenem and clindamycin IV we might build let her go home tomorrow with dressing change orders.  She is okay right now with bathroom privileges with assistance and using a walker.  She has a postoperative shoe in the room that needs to be on as well.  Active Problems:   Cellulitis of left lower extremity

## 2018-06-02 NOTE — Care Management Note (Signed)
Case Management Note  Patient Details  [name] MRN: [id number] Date of Birth: 04/07/1925  Subjective/Objective:                  RNCM met with patient to deliver [facility] letter and discharge assessment. Patient plans to return to home with husband and hired caregivers.  She states caregivers are available 9A-1P and 6P-10P.  They provide transportation when needed, help with groceries, and medication needs.  She states she ambulates at baseline with a walker. She has a rollator and a front-wheeled walker at home.  She has a daughter [name] in [place] (patient's home town) and a son [name] that lives in [place] (he is wheelchair dependent due to paralysis).  Her PCP is Dr. Ginette Pitman with [facility]. Supplemental O2 is acute. She uses [facility] for medications. She is currently open to [facility] for nursing and Physical Therapy. She would like to resume that at discharge.  Please add any wound care orders to plan.  Action/Plan: Home health agency list per CMS.gov based on 3-5 start rating with notification that [facility] in [facility]  [facility] is on that list also.  Cassie with [facility] updated of patient presentation to hospital.  RNCM will continue to follow.   Expected Discharge Date:                  Expected Discharge Plan:     In-House Referral:     Discharge planning Services  CM Consult  Post Acute Care Choice:  Resumption of Svcs/PTA Provider, Home Health Choice offered to:  Patient  DME Arranged:    DME Agency:     HH Arranged:  PT, RN [place] Agency:  [facility]  Status of Service:  In process, will continue to follow  If discussed at Long Length of Stay Meetings, dates discussed:    Additional Comments:  Marshell Garfinkel, RN 06/02/2018, 10:39 AM

## 2018-06-02 NOTE — Care Management (Signed)
Cassie with [facility] updated that patient is here under observation status.  RNCM will follow. ca

## 2018-06-02 NOTE — Progress Notes (Signed)
[facility] [name]    MR#:  [id number]  DATE OF BIRTH:  04/09/1925  SUBJECTIVE:  CHIEF COMPLAINT: Patient is  resting comfortably her left foot wound was cleaned and dressed by Dr. Elvina Mattes this a.m. Husband and caregiver flow walks at bedside  REVIEW OF SYSTEMS:  CONSTITUTIONAL: No fever, fatigue or weakness.  EYES: No blurred or double vision.  EARS, NOSE, AND THROAT: No tinnitus or ear pain.  RESPIRATORY: No cough, shortness of breath, wheezing or hemoptysis.  CARDIOVASCULAR: No chest pain, orthopnea, edema.  GASTROINTESTINAL: No nausea, vomiting, diarrhea or abdominal pain.  GENITOURINARY: No dysuria, hematuria.  ENDOCRINE: No polyuria, nocturia,  HEMATOLOGY: No anemia, easy bruising or bleeding SKIN: No rash or lesion. MUSCULOSKELETAL: No joint pain or arthritis.   NEUROLOGIC: No tingling, numbness, weakness.  PSYCHIATRY: No anxiety or depression.   DRUG ALLERGIES:   Allergies  Allergen Reactions  . Ceftriaxone Swelling  . Cefuroxime Hives  . Cephalosporins Swelling and Other (See Comments)    Reaction:  Fainting/dry mouth   . Codeine Nausea And Vomiting  . Epinephrine Other (See Comments)    Reaction:  Fainting   . Morphine And Related Other (See Comments)    Reaction:  GI upset   . Buprenorphine Hcl Nausea And Vomiting  . Ciprofloxacin Itching, Swelling and Rash  . Latex Rash  . Lidocaine Rash  . Procaine Rash    VITALS:  Blood pressure 140/67, pulse 72, temperature 97.8 F (36.6 C), temperature source Oral, resp. rate 20, height 5\' 6"  (1.676 m), weight 78.5 kg, SpO2 96 %.  PHYSICAL EXAMINATION:  GENERAL:  84 y.o.-year-old patient lying in the bed with no acute distress.  EYES: Pupils equal, round, reactive to light and accommodation. No scleral icterus. Extraocular muscles intact.  HEENT: Head atraumatic, normocephalic. Oropharynx and nasopharynx clear.  NECK:  Supple, no jugular venous  distention. No thyroid enlargement, no tenderness.  LUNGS: Normal breath sounds bilaterally, no wheezing, rales,rhonchi or crepitation. No use of accessory muscles of respiration.  CARDIOVASCULAR: S1, S2 normal. No murmurs, rubs, or gallops.  ABDOMEN: Soft, nontender, nondistended. Bowel sounds present.  EXTREMITIES: No pedal edema, cyanosis, or clubbing.  NEUROLOGIC: Awake, alert and oriented x3 sensation intact. Gait not checked.  PSYCHIATRIC: The patient is alert and oriented x 3.  SKIN: Left foot clean dressing changed today morning by Dr. Elvina Mattes    LABORATORY PANEL:   CBC Recent Labs  Lab 06/01/18 1624  WBC 7.0  HGB 11.1*  HCT 36.5  PLT 226   ------------------------------------------------------------------------------------------------------------------  Chemistries  Recent Labs  Lab 06/01/18 1624  NA 140  K 4.6  CL 111  CO2 23  GLUCOSE 206*  BUN 27*  CREATININE 0.97  CALCIUM 8.8*   ------------------------------------------------------------------------------------------------------------------  Cardiac Enzymes Recent Labs  Lab 06/01/18 1624  TROPONINI <0.03   ------------------------------------------------------------------------------------------------------------------  RADIOLOGY:  Dg Chest 2 View  Result Date: 06/01/2018 CLINICAL DATA:  Shortness of breath EXAM: CHEST - 2 VIEW COMPARISON:  05/16/2018, 02-09-202019 FINDINGS: Left-sided pacing device as before. Mild cardiomegaly with aortic atherosclerosis. No acute airspace disease. No pleural effusion. Hyperinflation. Surgical hardware within the thoracolumbar spine. No pneumothorax. IMPRESSION: No active cardiopulmonary disease.  Mild cardiomegaly. Electronically Signed   By: Donavan Foil M.D.   On: 06/01/2018 17:06   Ct Angio Chest Pe W And/or Wo Contrast  Result Date: 06/01/2018 CLINICAL DATA:  Acute shortness of breath. Bilateral leg swelling. EXAM: CT ANGIOGRAPHY CHEST WITH CONTRAST TECHNIQUE:  Multidetector CT imaging of the chest was performed using the standard protocol during bolus administration of intravenous contrast. Multiplanar CT image reconstructions and MIPs were obtained to evaluate the vascular anatomy. CONTRAST:  61mL OMNIPAQUE IOHEXOL 350 MG/ML SOLN COMPARISON:  Chest radiographs obtained earlier today. Chest CT dated 03/15/2016. FINDINGS: Cardiovascular: Normally opacified pulmonary arteries with no pulmonary arterial filling defects seen. Atheromatous calcifications, including the coronary arteries and aorta. Enlarged heart, most pronounced involving the right atrium followed by the left atrium. Mediastinum/Nodes: Enlarged subcarinal node with a short axis diameter of 23 mm on image number 132 series 6. Mildly enlarged right hilar nodes, including a node with a short axis diameter of 10 mm on image number 140 series 6. Multiple enlarged superior mediastinal nodes, including a right anterior paratracheal node with a short axis diameter of 19 mm on image number 94 series 6. Unremarkable thyroid gland and esophagus. Lungs/Pleura: 8 x 3 mm subpleural nodule in the left lower lobe on image number 53 series 5, unchanged. 8 mm right lower lobe nodule on image number 73 series 5, unchanged. A previously demonstrated 7 mm right lower lobe nodule is no longer visualized. A previously demonstrated smaller right lower lobe nodule is also no longer visualized. Mild patchy ground-glass opacity scattered in both lungs with mild progression. Mild increase in prominence of the interstitial markings in both lower lung zones with septal thickening. No pleural fluid. Upper Abdomen: Reflux of contrast into the hepatic veins and inferior vena cava. Artifacts produced by lumbar spine fixation hardware. Musculoskeletal: Stable thoracolumbar spine fixation hardware and T10 vertebral body old compression deformity with mild bony retropulsion. Thoracic and lower cervical spine degenerative changes. Review of the  MIP images confirms the above findings. IMPRESSION: 1. No pulmonary emboli. 2. Mild changes of congestive heart failure with right heart failure. 3. Mildly progressive mediastinal and right hilar adenopathy, most likely reactive. 4. Stable bilateral lower lobe pulmonary nodules, previously stable since 2016. The long-term stability is compatible with a benign process. 5.  Calcific coronary artery and aortic atherosclerosis. Aortic Atherosclerosis (ICD10-I70.0). Electronically Signed   By: Claudie Revering M.D.   On: 06/01/2018 22:40   US Venous Img Lower Unilateral Left  Result Date: 06/01/2018 CLINICAL DATA:  Left lower extremity pain and edema EXAM: Left LOWER EXTREMITY VENOUS DOPPLER ULTRASOUND TECHNIQUE: Gray-scale sonography with graded compression, as well as color Doppler and duplex ultrasound were performed to evaluate the lower extremity deep venous systems from the level of the common femoral vein and including the common femoral, femoral, profunda femoral, popliteal and calf veins including the posterior tibial, peroneal and gastrocnemius veins when visible. The superficial great saphenous vein was also interrogated. Spectral Doppler was utilized to evaluate flow at rest and with distal augmentation maneuvers in the common femoral, femoral and popliteal veins. COMPARISON:  04/14/2017 FINDINGS: Contralateral Common Femoral Vein: Respiratory phasicity is normal and symmetric with the symptomatic side. No evidence of thrombus. Normal compressibility. Common Femoral Vein: No evidence of thrombus. Normal compressibility, respiratory phasicity and response to augmentation. Saphenofemoral Junction: No evidence of thrombus. Normal compressibility and flow on color Doppler imaging. Profunda Femoral Vein: No evidence of thrombus. Normal compressibility and flow on color Doppler imaging. Femoral Vein: No evidence of thrombus. Normal compressibility, respiratory phasicity and response to augmentation. Popliteal Vein:  No evidence of thrombus. Normal compressibility, respiratory phasicity and response to augmentation. Calf Veins: Poorly visualized. Other Findings:  None. IMPRESSION: No evidence of deep venous thrombosis. Electronically Signed   By: Donavan Foil  M.D.   On: 06/01/2018 20:51   Dg Knee Complete 4 Views Right  Result Date: 06/01/2018 CLINICAL DATA:  Pain, post fall swelling and bruising EXAM: RIGHT KNEE - COMPLETE 4+ VIEW COMPARISON:  07/15/2017 FINDINGS: No fracture or malalignment. Moderate degenerative change medial compartment. No significant knee effusion. Vascular calcifications. IMPRESSION: No acute osseous abnormality.  Moderate arthritis. Electronically Signed   By: Donavan Foil M.D.   On: 06/01/2018 20:03    EKG:   Orders placed or performed during the hospital encounter of 06/01/18  . ED EKG within 10 minutes  . ED EKG within 10 minutes    ASSESSMENT AND PLAN:   84 year old female with history of congestive heart failure, diabetes mellitus, hyperlipidemia hypertension is presenting to the ED with left leg swelling and pain with redness  #  left lower extremity cellulitis Continue meropenem discontinued clindamycin Follow-up on the wound culture and sensitivity Dr. Elvina Mattes is following Wound care as recommended by podiatry Pain management as needed  #Non-insulin-requiring diabetes mellitus Insulin sliding scale  #Chronic COPD no exacerbation continue inhalers  #Essential hypertension-blood pressure stable at this time continue current management with Coreg  #Chronic diastolic CHF continue Coreg, aspirin, Lasix Monitor intake and output and daily weights Previous ejection fraction in June is 50 to 55%   All the records are reviewed and case discussed with Care Management/Social Workerr. Management plans discussed with the patient, family and they are in agreement.  CODE STATUS: DNR   TOTAL TIME TAKING CARE OF THIS PATIENT: 36  minutes.   POSSIBLE D/C IN 1-2 DAYS,  DEPENDING ON CLINICAL CONDITION.  Note: This dictation was prepared with Dragon dictation along with smaller phrase technology. Any transcriptional errors that result from this process are unintentional.   Nicholes Mango M.D on 06/02/2018 at 3:58 PM  Between 7am to 6pm - Pager - [phone] After 6pm go to [email or web address] - password [email or web address] [facility]  Office  [phone]  CC: Primary care physician; Tracie Harrier, MD

## 2018-06-03 LAB — GLUCOSE, CAPILLARY
Glucose-Capillary: 114 mg/dL — ABNORMAL HIGH (ref 70–99)
Glucose-Capillary: 341 mg/dL — ABNORMAL HIGH (ref 70–99)

## 2018-06-03 LAB — BASIC METABOLIC PANEL
Anion gap: 7 (ref 5–15)
BUN: 22 mg/dL (ref 8–23)
CO2: 24 mmol/L (ref 22–32)
Calcium: 8.5 mg/dL — ABNORMAL LOW (ref 8.9–10.3)
Chloride: 108 mmol/L (ref 98–111)
Creatinine, Ser: 0.89 mg/dL (ref 0.44–1.00)
GFR calc Af Amer: >60 mL/min (ref >60)
GFR, EST NON AFRICAN AMERICAN: 57 mL/min — AB (ref >60)
Glucose, Bld: 143 mg/dL — ABNORMAL HIGH (ref 70–99)
Potassium: 4.4 mmol/L (ref 3.5–5.1)
Sodium: 139 mmol/L (ref 135–145)

## 2018-06-03 LAB — CBC
HCT: 32.5 % — ABNORMAL LOW (ref 36.0–46.0)
Hemoglobin: 9.9 g/dL — ABNORMAL LOW (ref 12.0–15.0)
MCH: 29.3 pg (ref 26.0–34.0)
MCHC: 30.5 g/dL (ref 30.0–36.0)
MCV: 96.2 fL (ref 80.0–100.0)
PLATELETS: 197 10*3/uL (ref 150–400)
RBC: 3.38 MIL/uL — ABNORMAL LOW (ref 3.87–5.11)
RDW: 14.7 % (ref 11.5–15.5)
WBC: 6.1 10*3/uL (ref 4.0–10.5)
nRBC: 0 % (ref 0.0–0.2)

## 2018-06-03 MED ORDER — INSULIN ASPART 100 UNIT/ML ~~LOC~~ SOLN: 2.0000 [IU] | Freq: Three times a day (TID) | SUBCUTANEOUS | Status: DC

## 2018-06-03 MED ORDER — INSULIN ASPART 100 UNIT/ML ~~LOC~~ SOLN: 2.0000 [IU] | Freq: Three times a day (TID) | SUBCUTANEOUS | 11 refills | Status: DC

## 2018-06-03 MED ORDER — OXYCODONE HCL 5 MG PO CAPS: 5.0000 mg | ORAL_CAPSULE | Freq: Four times a day (QID) | ORAL | 0 refills | Status: DC | PRN

## 2018-06-03 MED ORDER — INSULIN PEN NEEDLE 31G X 5 MM MISC: 4.0000 | Freq: Three times a day (TID) | 0 refills | Status: AC

## 2018-06-03 NOTE — Discharge Instructions (Signed)
Follow-up with primary care physician in 3 -4days Follow-up with podiatry Dr. Elvina Mattes on Friday Continue topical wet-to-dry saline dressing daily

## 2018-06-03 NOTE — Care Management (Signed)
RNCM notified Cassie with [facility] that patient was being discharged today.  Patient denies any other CM needs. No other RNCM needs.

## 2018-06-03 NOTE — Discharge Summary (Signed)
[facility] [place] [name]    MR#:  [id number]  DATE OF BIRTH:  10-04-1924  DATE OF ADMISSION:  06/01/2018 ADMITTING PHYSICIAN: Arta Silence, MD  DATE OF DISCHARGE:  06/03/18  PRIMARY CARE PHYSICIAN: Tracie Harrier, MD    ADMISSION DIAGNOSIS:  Dyspnea on exertion [R06.09] Pseudomonas aeruginosa infection [A49.8] Cellulitis of left lower extremity [L03.116] Proteus mirabilis infection [A49.8] Acute pain of right knee [M25.561]  DISCHARGE DIAGNOSIS:  Active Problems:   Cellulitis of left lower extremity   SECONDARY DIAGNOSIS:   Past Medical History:  Diagnosis Date  . Cancer ([place])    skin ca  . CHF (congestive heart failure) ([place])   . Chronic back pain   . COPD (chronic obstructive pulmonary disease) ([place])   . Diabetes mellitus without complication ([place])   . Hypertension   . Poor perfusion of leg     HOSPITAL COURSE:  [name]  is a 84 y.o. female with a known history of CHF (s/p PPM), Afib (Xarelto), T2NIDDM, HTN, HLD, Hx chronic diastolic CHF/HFpEF (EF 52-84% as of 12/14/2017 Echo; grade 1 diastolic dysfxn as of 13/24/4010 Echo) p/w LLE cellulitis. AAOx3, weak but able to provide Hx. Daughter at bedside helps. Recent admit for AKI on CKD 2/2 dehydration, D/C 11/25. Follows w/ Podiatry (Dr. Elvina Mattes) as outpt. Tx for L great toe infxn as outpt, failed Tx. 2d Hx worsening erythema/swelling of LLE, up to knee. No obvious joint involvement. (+) TTP, (-) F/C/diaph/rigors. Recent fall, R knee pain. Frequent falls, walks w/ walker at baseline. DNR/DNI. Says she cannot have MRI, has PPM.   #  left lower extremity cellulitis Continued meropenem discontinued clindamycin during the hospital course Follow-up on the wound culture and sensitivity Seen by Dr. Elvina Mattes is okay to discharge patient with no antibiotics and patient is to follow-up with Dr. Elvina Mattes on Friday for IM Rocephin Podiatry recommending  wet-to-dry dressings with saline on daily basis Pain management as needed  #Non-insulin-requiring diabetes mellitus Insulin sliding scale QA CHS as needed as recommended by primary care physician Resume home medications  #Chronic COPD no exacerbation continue inhalers  #Essential hypertension-blood pressure stable at this time continue current management with Coreg  #Chronic diastolic CHF continue Coreg, aspirin, Lasix Monitor intake and output and daily weights Previous ejection fraction in June is 50 to 55% Discharge patient home to family care patient has caregivers DISCHARGE CONDITIONS:   STABLE  CONSULTS OBTAINED:  Treatment Team:  Arta Silence, MD Albertine Patricia, DPM   PROCEDURES  None   DRUG ALLERGIES:   Allergies  Allergen Reactions  . Ceftriaxone Swelling  . Cefuroxime Hives  . Cephalosporins Swelling and Other (See Comments)    Reaction:  Fainting/dry mouth   . Codeine Nausea And Vomiting  . Epinephrine Other (See Comments)    Reaction:  Fainting   . Morphine And Related Other (See Comments)    Reaction:  GI upset   . Buprenorphine Hcl Nausea And Vomiting  . Ciprofloxacin Itching, Swelling and Rash  . Latex Rash  . Lidocaine Rash  . Procaine Rash    DISCHARGE MEDICATIONS:   Allergies as of 06/03/2018      Reactions   Ceftriaxone Swelling   Cefuroxime Hives   Cephalosporins Swelling, Other (See Comments)   Reaction:  Fainting/dry mouth    Codeine Nausea And Vomiting   Epinephrine Other (See Comments)   Reaction:  Fainting    Morphine And Related Other (See Comments)  Reaction:  GI upset    Buprenorphine Hcl Nausea And Vomiting   Ciprofloxacin Itching, Swelling, Rash   Latex Rash   Lidocaine Rash   Procaine Rash      Medication List    STOP taking these medications   feeding supplement (ENSURE ENLIVE) Liqd     TAKE these medications   acetaminophen 325 MG tablet Commonly known as:  TYLENOL Take 2 tablets (650 mg total)  by mouth every 6 (six) hours as needed for mild pain (or Fever >/= 101).   aspirin EC 81 MG tablet Take 81 mg by mouth daily.   baclofen 10 MG tablet Commonly known as:  LIORESAL Take 10 mg by mouth 3 (three) times daily as needed.   buPROPion 150 MG 24 hr tablet Commonly known as:  WELLBUTRIN XL Take 1 tablet by mouth daily.   carvedilol 6.25 MG tablet Commonly known as:  COREG Take 6.25 mg by mouth 2 (two) times daily with a meal.   cholecalciferol 1000 units tablet Commonly known as:  VITAMIN D Take 1,000 Units by mouth daily.   docusate sodium 100 MG capsule Commonly known as:  COLACE Take 100 mg by mouth 2 (two) times daily.   escitalopram 10 MG tablet Commonly known as:  LEXAPRO Take 10 mg by mouth daily.   ferrous sulfate 325 (65 FE) MG tablet Take 325 mg by mouth daily with breakfast.   furosemide 40 MG tablet Commonly known as:  LASIX Take 0.5 tablets (20 mg total) by mouth daily.   gabapentin 100 MG capsule Commonly known as:  NEURONTIN Take 100-200 mg by mouth at bedtime.   glimepiride 4 MG tablet Commonly known as:  AMARYL Take 4 mg by mouth 2 (two) times daily.   Insulin Pen Needle 31G X 5 MM Misc 4 Syringes by Does not apply route 4 (four) times daily -  before meals and at bedtime. Use 3 times daily with meals and at bedtime as needed   JANUVIA 100 MG tablet Generic drug:  sitaGLIPtin Take 100 mg by mouth daily.   magnesium oxide 400 MG tablet Commonly known as:  MAG-OX Take 400 mg by mouth daily.   Melatonin 1 MG Tabs Take 1 mg by mouth at bedtime.   metFORMIN 500 MG tablet Commonly known as:  GLUCOPHAGE Take 1 tablet by mouth daily.   multivitamin tablet Take 1 tablet by mouth daily.   oxycodone 5 MG capsule Commonly known as:  OXY-IR Take 1-2 capsules (5-10 mg total) by mouth every 6 (six) hours as needed (MOD SEVERE pain). What changed:    how much to take  when to take this  reasons to take this   pantoprazole 40 MG  tablet Commonly known as:  PROTONIX Take 40 mg by mouth daily.   PROAIR HFA 108 (90 Base) MCG/ACT inhaler Generic drug:  albuterol Inhale 2 puffs into the lungs every 6 (six) hours as needed for wheezing or shortness of breath.   Rivaroxaban 15 MG Tabs tablet Commonly known as:  XARELTO Take 15 mg by mouth daily.   rOPINIRole 0.5 MG tablet Commonly known as:  REQUIP Take 0.5 mg by mouth 2 (two) times daily.   spironolactone 25 MG tablet Commonly known as:  ALDACTONE Take 0.5 tablets (12.5 mg total) by mouth daily.   vitamin B-12 1000 MCG tablet Commonly known as:  CYANOCOBALAMIN Take 1,000 mcg by mouth daily.   vitamin C 500 MG tablet Commonly known as:  ASCORBIC ACID Take 500  mg by mouth daily.   Vitamin D (Ergocalciferol) 1.25 MG (50000 UT) Caps capsule Commonly known as:  DRISDOL Take 50,000 Units by mouth every 7 (seven) days. Pt takes on Thursday.        DISCHARGE INSTRUCTIONS:   Follow-up with primary care physician in 3 -4days Follow-up with podiatry Dr. Elvina Mattes on Friday Continue topical wet-to-dry saline dressing daily  DIET:  Cardiac diet and Diabetic diet  DISCHARGE CONDITION:  Stable  ACTIVITY:  Activity as tolerated  OXYGEN:  Home Oxygen: yes    Oxygen Delivery: 2 L nightly as needed  DISCHARGE LOCATION:  home   If you experience worsening of your admission symptoms, develop shortness of breath, life threatening emergency, suicidal or homicidal thoughts you must seek medical attention immediately by calling 911 or calling your MD immediately  if symptoms less severe.  You Must read complete instructions/literature along with all the possible adverse reactions/side effects for all the Medicines you take and that have been prescribed to you. Take any new Medicines after you have completely understood and accpet all the possible adverse reactions/side effects.   Please note  You were cared for by a hospitalist during your hospital stay. If  you have any questions about your discharge medications or the care you received while you were in the hospital after you are discharged, you can call the unit and asked to speak with the hospitalist on call if the hospitalist that took care of you is not available. Once you are discharged, your primary care physician will handle any further medical issues. Please note that NO REFILLS for any discharge medications will be authorized once you are discharged, as it is imperative that you return to your primary care physician (or establish a relationship with a primary care physician if you do not have one) for your aftercare needs so that they can reassess your need for medications and monitor your lab values.     Today  Chief Complaint  Patient presents with  . Cellulitis  . Leg Swelling   Patient is doing much better.  Wants to go home and okay to discharge patient from [facility] standpoint The left foot wound rewrapped by Dr. Elvina Mattes  ROS:  CONSTITUTIONAL: Denies fevers, chills. Denies any fatigue, weakness.  EYES: Denies blurry vision, double vision, eye pain. EARS, NOSE, THROAT: Denies tinnitus, ear pain, hearing loss. RESPIRATORY: Denies cough, wheeze, shortness of breath.  CARDIOVASCULAR: Denies chest pain, palpitations, edema.  GASTROINTESTINAL: Denies nausea, vomiting, diarrhea, abdominal pain. Denies bright red blood per rectum. GENITOURINARY: Denies dysuria, hematuria. ENDOCRINE: Denies nocturia or thyroid problems. HEMATOLOGIC AND LYMPHATIC: Denies easy bruising or bleeding. SKIN: Denies rash or lesion. MUSCULOSKELETAL: Left foot wound NEUROLOGIC: Denies paralysis, paresthesias.  PSYCHIATRIC: Denies anxiety or depressive symptoms.   VITAL SIGNS:  Blood pressure (!) 143/68, pulse 71, temperature 97.8 F (36.6 C), temperature source Oral, resp. rate (!) 21, height 5\' 6"  (1.676 m), weight 78.5 kg, SpO2 98 %.  I/O:    Intake/Output Summary (Last 24 hours) at 06/03/2018  1523 Last data filed at 06/03/2018 1446 Gross per 24 hour  Intake 445.5 ml  Output 900 ml  Net -454.5 ml    PHYSICAL EXAMINATION:  GENERAL:  84 y.o.-year-old patient lying in the bed with no acute distress.  EYES: Pupils equal, round, reactive to light and accommodation. No scleral icterus. Extraocular muscles intact.  HEENT: Head atraumatic, normocephalic. Oropharynx and nasopharynx clear.  NECK:  Supple, no jugular venous distention. No thyroid enlargement, no tenderness.  LUNGS: Normal breath sounds bilaterally, no wheezing, rales,rhonchi or crepitation. No use of accessory muscles of respiration.  CARDIOVASCULAR: S1, S2 normal. No murmurs, rubs, or gallops.  ABDOMEN: Soft, non-tender, non-distended. Bowel sounds present.  EXTREMITIES: Left foot and clean dressing no pedal edema, cyanosis, or clubbing.  NEUROLOGIC: Awake alert and oriented x3 sensation intact. Gait not checked.  PSYCHIATRIC: The patient is alert and oriented x 3.  SKIN: No obvious rash, lesion.   DATA REVIEW:   CBC Recent Labs  Lab 06/03/18 0431  WBC 6.1  HGB 9.9*  HCT 32.5*  PLT 197    Chemistries  Recent Labs  Lab 06/03/18 0431  NA 139  K 4.4  CL 108  CO2 24  GLUCOSE 143*  BUN 22  CREATININE 0.89  CALCIUM 8.5*    Cardiac Enzymes Recent Labs  Lab 06/01/18 1624  TROPONINI <0.03    Microbiology Results  Results for orders placed or performed during the hospital encounter of 05/16/18  Urine Culture     Status: None   Collection Time: 05/16/18  9:25 PM  Result Value Ref Range Status   Specimen Description   Final    URINE, RANDOM Performed at [facility], [address] [place], [place] [address]    Special Requests   Final    NONE Performed at [facility], [address], [place], [place] [address]    Culture   Final    NO GROWTH Performed at [facility], [place] [address] [place], [place] [address]    Report Status 05/18/2018 FINAL  Final     RADIOLOGY:  Dg Chest 2 View  Result Date: 06/01/2018 CLINICAL DATA:  Shortness of breath EXAM: CHEST - 2 VIEW COMPARISON:  05/16/2018, Aug 27, 202019 FINDINGS: Left-sided pacing device as before. Mild cardiomegaly with aortic atherosclerosis. No acute airspace disease. No pleural effusion. Hyperinflation. Surgical hardware within the thoracolumbar spine. No pneumothorax. IMPRESSION: No active cardiopulmonary disease.  Mild cardiomegaly. Electronically Signed   By: Donavan Foil M.D.   On: 06/01/2018 17:06   Ct Angio Chest Pe W And/or Wo Contrast  Result Date: 06/01/2018 CLINICAL DATA:  Acute shortness of breath. Bilateral leg swelling. EXAM: CT ANGIOGRAPHY CHEST WITH CONTRAST TECHNIQUE: Multidetector CT imaging of the chest was performed using the standard protocol during bolus administration of intravenous contrast. Multiplanar CT image reconstructions and MIPs were obtained to evaluate the vascular anatomy. CONTRAST:  27mL OMNIPAQUE IOHEXOL 350 MG/ML SOLN COMPARISON:  Chest radiographs obtained earlier today. Chest CT dated 03/15/2016. FINDINGS: Cardiovascular: Normally opacified pulmonary arteries with no pulmonary arterial filling defects seen. Atheromatous calcifications, including the coronary arteries and aorta. Enlarged heart, most pronounced involving the right atrium followed by the left atrium. Mediastinum/Nodes: Enlarged subcarinal node with a short axis diameter of 23 mm on image number 132 series 6. Mildly enlarged right hilar nodes, including a node with a short axis diameter of 10 mm on image number 140 series 6. Multiple enlarged superior mediastinal nodes, including a right anterior paratracheal node with a short axis diameter of 19 mm on image number 94 series 6. Unremarkable thyroid gland and esophagus. Lungs/Pleura: 8 x 3 mm subpleural nodule in the left lower lobe on image number 53 series 5, unchanged. 8 mm right lower lobe nodule on image number 73 series 5, unchanged. A previously  demonstrated 7 mm right lower lobe nodule is no longer visualized. A previously demonstrated smaller right lower lobe nodule is also no longer visualized. Mild patchy ground-glass opacity scattered  in both lungs with mild progression. Mild increase in prominence of the interstitial markings in both lower lung zones with septal thickening. No pleural fluid. Upper Abdomen: Reflux of contrast into the hepatic veins and inferior vena cava. Artifacts produced by lumbar spine fixation hardware. Musculoskeletal: Stable thoracolumbar spine fixation hardware and T10 vertebral body old compression deformity with mild bony retropulsion. Thoracic and lower cervical spine degenerative changes. Review of the MIP images confirms the above findings. IMPRESSION: 1. No pulmonary emboli. 2. Mild changes of congestive heart failure with right heart failure. 3. Mildly progressive mediastinal and right hilar adenopathy, most likely reactive. 4. Stable bilateral lower lobe pulmonary nodules, previously stable since 2016. The long-term stability is compatible with a benign process. 5.  Calcific coronary artery and aortic atherosclerosis. Aortic Atherosclerosis (ICD10-I70.0). Electronically Signed   By: Claudie Revering M.D.   On: 06/01/2018 22:40   US Venous Img Lower Unilateral Left  Result Date: 06/01/2018 CLINICAL DATA:  Left lower extremity pain and edema EXAM: Left LOWER EXTREMITY VENOUS DOPPLER ULTRASOUND TECHNIQUE: Gray-scale sonography with graded compression, as well as color Doppler and duplex ultrasound were performed to evaluate the lower extremity deep venous systems from the level of the common femoral vein and including the common femoral, femoral, profunda femoral, popliteal and calf veins including the posterior tibial, peroneal and gastrocnemius veins when visible. The superficial great saphenous vein was also interrogated. Spectral Doppler was utilized to evaluate flow at rest and with distal augmentation maneuvers in  the common femoral, femoral and popliteal veins. COMPARISON:  04/14/2017 FINDINGS: Contralateral Common Femoral Vein: Respiratory phasicity is normal and symmetric with the symptomatic side. No evidence of thrombus. Normal compressibility. Common Femoral Vein: No evidence of thrombus. Normal compressibility, respiratory phasicity and response to augmentation. Saphenofemoral Junction: No evidence of thrombus. Normal compressibility and flow on color Doppler imaging. Profunda Femoral Vein: No evidence of thrombus. Normal compressibility and flow on color Doppler imaging. Femoral Vein: No evidence of thrombus. Normal compressibility, respiratory phasicity and response to augmentation. Popliteal Vein: No evidence of thrombus. Normal compressibility, respiratory phasicity and response to augmentation. Calf Veins: Poorly visualized. Other Findings:  None. IMPRESSION: No evidence of deep venous thrombosis. Electronically Signed   By: Donavan Foil M.D.   On: 06/01/2018 20:51   Dg Knee Complete 4 Views Right  Result Date: 06/01/2018 CLINICAL DATA:  Pain, post fall swelling and bruising EXAM: RIGHT KNEE - COMPLETE 4+ VIEW COMPARISON:  07/15/2017 FINDINGS: No fracture or malalignment. Moderate degenerative change medial compartment. No significant knee effusion. Vascular calcifications. IMPRESSION: No acute osseous abnormality.  Moderate arthritis. Electronically Signed   By: Donavan Foil M.D.   On: 06/01/2018 20:03    EKG:   Orders placed or performed during the hospital encounter of 06/01/18  . ED EKG within 10 minutes  . ED EKG within 10 minutes      Management plans discussed with the patient, family and they are in agreement.  CODE STATUS:     Code Status Orders  (From admission, onward)         Start     Ordered   06/02/18 0138  Do not attempt resuscitation (DNR)  Continuous    Question Answer Comment  In the event of cardiac or respiratory ARREST Do not call a "code blue"   In the event  of cardiac or respiratory ARREST Do not perform Intubation, CPR, defibrillation or ACLS   In the event of cardiac or respiratory ARREST Use medication by any route, position,  wound care, and other measures to relive pain and suffering. May use oxygen, suction and manual treatment of airway obstruction as needed for comfort.      06/02/18 0137        Code Status History    Date Active Date Inactive Code Status Order ID Comments User Context   05/17/2018 0118 05/18/2018 1826 DNR [id number]  Amelia Jo, MD Inpatient   12/11/2017 2059 10/22/202019 2127 DNR [id number]  Hillary Bow, MD ED   07/15/2017 1830 07/19/2017 1845 DNR [id number]  Salary, Avel Peace, MD Inpatient   04/14/2017 0344 04/18/2017 2138 Full Code [id number]  Lance Coon, MD Inpatient   12/08/2016 0107 12/09/2016 2031 Full Code [id number]  Lance Coon, MD ED   09/07/2015 0441 09/13/2015 1955 Full Code [id number]  Saundra Shelling, MD Inpatient   09/01/2015 1533 09/03/2015 1907 Full Code [id number]  Epifanio Lesches, MD ED   03/21/2015 0626 03/22/2015 1939 Full Code [id number]  Harrie Foreman, MD Inpatient    Advance Directive Documentation     Most Recent Value  Type of Advance Directive  Healthcare Power of Attorney, Living will  Pre-existing out of facility DNR order (yellow form or pink MOST form)  -  "MOST" Form in Place?  -      TOTAL TIME TAKING CARE OF THIS PATIENT: 43 minutes.   Note: This dictation was prepared with Dragon dictation along with smaller phrase technology. Any transcriptional errors that result from this process are unintentional.   @[facility] @  on 06/03/2018 at 3:23 PM  Between 7am to 6pm - Pager - [phone]  After 6pm go to [email or web address] - password [email or web address]  Office  [phone]  CC: Primary care physician; Tracie Harrier, MD

## 2018-06-03 NOTE — Progress Notes (Signed)
[facility]                                                      Patient Demographics  [name], is a 84 y.o. female   MRN: [id number]   DOB - [birth year]  Admit Date - 06/01/2018    Outpatient Primary MD for the patient is Tracie Harrier, MD  Consult requested in the Hospital by Nicholes Mango, MD, On 06/03/2018  With History of -  Past Medical History:  Diagnosis Date  . Cancer ([place])    skin ca  . CHF (congestive heart failure) ([place])   . Chronic back pain   . COPD (chronic obstructive pulmonary disease) ([place])   . Diabetes mellitus without complication ([place])   . Hypertension   . Poor perfusion of leg       Past Surgical History:  Procedure Laterality Date  . ABDOMINAL HYSTERECTOMY    . BACK SURGERY     x3  . CORONARY STENT PLACEMENT     unknown location per pt  . FOOT SURGERY    . LEG SURGERY    . PACEMAKER PLACEMENT    . TONSILLECTOMY      in for   Chief Complaint  Patient presents with  . Cellulitis  . Leg Swelling     HPI  [name]  is a 84 y.o. female, hospitalized because of cellulitis and diabetic wound to the left great toe cellulitis in the lower leg and excessive swelling to the lower left leg as well.    Review of Systems    In addition to the HPI above,  No Fever-chills, No Headache, No changes with Vision or hearing, No problems swallowing food or Liquids, No Chest pain, Cough or Shortness of Breath, No Abdominal pain, No Nausea or Vommitting, Bowel movements are regular, No Blood in stool or Urine, No dysuria, No new skin rashes or bruises, No new joints pains-aches,  No new weakness, tingling, numbness in any extremity, No recent weight gain or loss, No polyuria, polydypsia or polyphagia, No significant Mental Stressors.  A full 10 point Review of Systems was done, except as stated above, all  other Review of Systems were negative.   Social History Social History   Tobacco Use  . Smoking status: Never Smoker  . Smokeless tobacco: Never Used  Substance Use Topics  . Alcohol use: No    Alcohol/week: 0.0 standard drinks    Family History Family History  Problem Relation Age of Onset  . CVA Mother        Congestive Heart Failure, heart attack, hypertension, stroke  . Diabetes Mellitus II Sister   . Heart disease Father        Congestive Heart Failure, heart attack, hypertension    Prior to Admission medications   Medication Sig Start Date End Date Taking? Authorizing Provider  aspirin EC 81 MG tablet Take 81 mg by mouth daily.   Yes [provider]  baclofen (LIORESAL) 10 MG tablet Take 10 mg by mouth 3 (three) times daily as needed.    Yes [provider]  carvedilol (COREG) 6.25 MG tablet Take 6.25 mg by mouth 2 (two) times daily with a meal.    Yes [provider]  cholecalciferol (VITAMIN D) 1000 units tablet Take 1,000 Units by mouth daily.  Yes [provider]  docusate sodium (COLACE) 100 MG capsule Take 100 mg by mouth 2 (two) times daily.    Yes [provider]  escitalopram (LEXAPRO) 10 MG tablet Take 10 mg by mouth daily.   Yes [provider]  ferrous sulfate 325 (65 FE) MG tablet Take 325 mg by mouth daily with breakfast.   Yes [provider]  furosemide (LASIX) 40 MG tablet Take 0.5 tablets (20 mg total) by mouth daily. 05/18/18  Yes Salary, Avel Peace, MD  gabapentin (NEURONTIN) 100 MG capsule Take 100-200 mg by mouth at bedtime.   Yes [provider]  glimepiride (AMARYL) 4 MG tablet Take 4 mg by mouth 2 (two) times daily. 07/01/17  Yes [provider]  magnesium oxide (MAG-OX) 400 MG tablet Take 400 mg by mouth daily.   Yes [provider]  Melatonin 1 MG TABS Take 1 mg by mouth at bedtime.   Yes [provider]  Multiple Vitamin (MULTIVITAMIN) tablet Take 1  tablet by mouth daily.   Yes [provider]  oxycodone (OXY-IR) 5 MG capsule Take 5 mg by mouth every 8 (eight) hours as needed.   Yes [provider]  pantoprazole (PROTONIX) 40 MG tablet Take 40 mg by mouth daily.   Yes [provider]  PROAIR HFA 108 (90 Base) MCG/ACT inhaler Inhale 2 puffs into the lungs every 6 (six) hours as needed for wheezing or shortness of breath.  10/02/17  Yes [provider]  Rivaroxaban (XARELTO) 15 MG TABS tablet Take 15 mg by mouth daily.   Yes [provider]  rOPINIRole (REQUIP) 0.5 MG tablet Take 0.5 mg by mouth 2 (two) times daily.   Yes [provider]  sitaGLIPtin (JANUVIA) 100 MG tablet Take 100 mg by mouth daily. 12/06/16  Yes [provider]  spironolactone (ALDACTONE) 25 MG tablet Take 0.5 tablets (12.5 mg total) by mouth daily. 05/18/18  Yes Salary, Avel Peace, MD  vitamin B-12 (CYANOCOBALAMIN) 1000 MCG tablet Take 1,000 mcg by mouth daily.   Yes [provider]  vitamin C (ASCORBIC ACID) 500 MG tablet Take 500 mg by mouth daily.   Yes [provider]  acetaminophen (TYLENOL) 325 MG tablet Take 2 tablets (650 mg total) by mouth every 6 (six) hours as needed for mild pain (or Fever >/= 101). Patient not taking: Reported on 06/02/2018 04/18/17   Nicholes Mango, MD  buPROPion (WELLBUTRIN XL) 150 MG 24 hr tablet Take 1 tablet by mouth daily. 11/28/17   [provider]  feeding supplement, ENSURE ENLIVE, (ENSURE ENLIVE) LIQD Take 237 mLs by mouth 2 (two) times daily between meals. 12/15/17   Bettey Costa, MD  metFORMIN (GLUCOPHAGE) 500 MG tablet Take 1 tablet by mouth daily. 12/10/17   [provider]  Vitamin D, Ergocalciferol, (DRISDOL) 50000 UNITS CAPS capsule Take 50,000 Units by mouth every 7 (seven) days. Pt takes on Thursday.    [provider]    Anti-infectives (From admission, onward)   Start     Dose/Rate Route Frequency Ordered Stop   06/02/18 0145   meropenem (MERREM) 1 g in sodium chloride 0.9 % 100 mL IVPB     1 g 200 mL/hr over 30 Minutes Intravenous Every 12 hours 06/02/18 0133     06/02/18 0045  clindamycin (CLEOCIN) IVPB 600 mg  Status:  Discontinued     600 mg 100 mL/hr over 30 Minutes Intravenous Every 8 hours 06/02/18 0031 06/02/18 1446   06/02/18  0045  meropenem (MERREM) 1 g in sodium chloride 0.9 % 100 mL IVPB  Status:  Discontinued     1 g 200 mL/hr over 30 Minutes Intravenous Every 8 hours 06/02/18 0031 06/02/18 0133   06/01/18 1945  gentamicin (GARAMYCIN) 200 mg in dextrose 5 % 50 mL IVPB     2.5 mg/kg  78.5 kg 110 mL/hr over 30 Minutes Intravenous  Once 06/01/18 1938 06/01/18 2153      Scheduled Meds: . aspirin EC  81 mg Oral Daily  . buPROPion  150 mg Oral Daily  . carvedilol  6.25 mg Oral BID WC  . cholecalciferol  1,000 Units Oral Daily  . escitalopram  10 mg Oral Daily  . feeding supplement (ENSURE ENLIVE)  237 mL Oral BID BM  . ferrous sulfate  325 mg Oral Q breakfast  . furosemide  20 mg Oral Daily  . gabapentin  100-200 mg Oral QHS  . insulin aspart  0-15 Units Subcutaneous TID WC  . insulin aspart  0-5 Units Subcutaneous QHS  . magnesium oxide  400 mg Oral Daily  . Melatonin  2.5 mg Oral QHS  . multivitamin with minerals  1 tablet Oral Daily  . pantoprazole  40 mg Oral Daily  . Rivaroxaban  15 mg Oral Q supper  . rOPINIRole  0.5 mg Oral BID  . spironolactone  12.5 mg Oral Daily  . vitamin B-12  1,000 mcg Oral Daily  . vitamin C  500 mg Oral Daily   Continuous Infusions: . meropenem (MERREM) IV Stopped (06/02/18 2244)   PRN Meds:.acetaminophen **OR** acetaminophen, albuterol, baclofen, bisacodyl, HYDROmorphone (DILAUDID) injection, ondansetron **OR** ondansetron (ZOFRAN) IV, oxyCODONE, senna-docusate  Allergies  Allergen Reactions  . Ceftriaxone Swelling  . Cefuroxime Hives  . Cephalosporins Swelling and Other (See Comments)    Reaction:  Fainting/dry mouth   . Codeine Nausea And Vomiting   . Epinephrine Other (See Comments)    Reaction:  Fainting   . Morphine And Related Other (See Comments)    Reaction:  GI upset   . Buprenorphine Hcl Nausea And Vomiting  . Ciprofloxacin Itching, Swelling and Rash  . Latex Rash  . Lidocaine Rash  . Procaine Rash    Physical Exam  Vitals  Blood pressure (!) 143/68, pulse 71, temperature 97.8 F (36.6 C), temperature source Oral, resp. rate (!) 21, height 5\' 6"  (1.676 m), weight 78.5 kg, SpO2 98 %.  Lower Extremity exam:  Vascular: Diminished bilateral chronic venous stasis and lymphedema to both legs left leg is always been worse.  Dermatological: Wound on left great toe is significantly improved cellulitis and redness is reduced there is no heavy drainage is still dry eschar on the wound now which should progress.  Neurological: Referral neuropathy  Ortho: History of osteomyelitis left foot with hallux malleus deformity  Data Review  CBC Recent Labs  Lab 06/01/18 1624 06/03/18 0431  WBC 7.0 6.1  HGB 11.1* 9.9*  HCT 36.5 32.5*  PLT 226 197  MCV 97.9 96.2  MCH 29.8 29.3  MCHC 30.4 30.5  RDW 14.6 14.7  LYMPHSABS 1.0  --   MONOABS 0.5  --   EOSABS 0.3  --   BASOSABS 0.1  --    ------------------------------------------------------------------------------------------------------------------  Chemistries  Recent Labs  Lab 06/01/18 1624 06/03/18 0431  NA 140 139  K 4.6 4.4  CL 111 108  CO2 23 24  GLUCOSE 206* 143*  BUN 27* 22  CREATININE 0.97 0.89  CALCIUM 8.8* 8.5*   ------------------------------------------------------------------------------------------------------------------  estimated creatinine clearance is 43.5 mL/min (by C-G formula based on SCr of 0.89 mg/dL). ------------------------------------------------------------------------------------------------------------------ No results for input(s): TSH, T4TOTAL, T3FREE, THYROIDAB in the last 72 hours.  Invalid input(s): FREET3 Urinalysis     Component Value Date/Time   COLORURINE YELLOW (A) 05/16/2018 2125   APPEARANCEUR CLEAR (A) 05/16/2018 2125   APPEARANCEUR Clear 08/19/2013 1455   LABSPEC 1.014 05/16/2018 2125   LABSPEC 1.019 08/19/2013 1455   PHURINE 5.0 05/16/2018 2125   GLUCOSEU 50 (A) 05/16/2018 2125   GLUCOSEU Negative 08/19/2013 1455   HGBUR NEGATIVE 05/16/2018 2125   BILIRUBINUR NEGATIVE 05/16/2018 2125   BILIRUBINUR Negative 08/19/2013 1455   KETONESUR NEGATIVE 05/16/2018 2125   PROTEINUR NEGATIVE 05/16/2018 2125   NITRITE NEGATIVE 05/16/2018 2125   LEUKOCYTESUR NEGATIVE 05/16/2018 2125   LEUKOCYTESUR 3+ 08/19/2013 1455     Imaging results:   Dg Chest 2 View  Result Date: 06/01/2018 CLINICAL DATA:  Shortness of breath EXAM: CHEST - 2 VIEW COMPARISON:  05/16/2018, 06-08-2018 FINDINGS: Left-sided pacing device as before. Mild cardiomegaly with aortic atherosclerosis. No acute airspace disease. No pleural effusion. Hyperinflation. Surgical hardware within the thoracolumbar spine. No pneumothorax. IMPRESSION: No active cardiopulmonary disease.  Mild cardiomegaly. Electronically Signed   By: Donavan Foil M.D.   On: 06/01/2018 17:06   Ct Angio Chest Pe W And/or Wo Contrast  Result Date: 06/01/2018 CLINICAL DATA:  Acute shortness of breath. Bilateral leg swelling. EXAM: CT ANGIOGRAPHY CHEST WITH CONTRAST TECHNIQUE: Multidetector CT imaging of the chest was performed using the standard protocol during bolus administration of intravenous contrast. Multiplanar CT image reconstructions and MIPs were obtained to evaluate the vascular anatomy. CONTRAST:  69mL OMNIPAQUE IOHEXOL 350 MG/ML SOLN COMPARISON:  Chest radiographs obtained earlier today. Chest CT dated 03/15/2016. FINDINGS: Cardiovascular: Normally opacified pulmonary arteries with no pulmonary arterial filling defects seen. Atheromatous calcifications, including the coronary arteries and aorta. Enlarged heart, most pronounced involving the right atrium followed by  the left atrium. Mediastinum/Nodes: Enlarged subcarinal node with a short axis diameter of 23 mm on image number 132 series 6. Mildly enlarged right hilar nodes, including a node with a short axis diameter of 10 mm on image number 140 series 6. Multiple enlarged superior mediastinal nodes, including a right anterior paratracheal node with a short axis diameter of 19 mm on image number 94 series 6. Unremarkable thyroid gland and esophagus. Lungs/Pleura: 8 x 3 mm subpleural nodule in the left lower lobe on image number 53 series 5, unchanged. 8 mm right lower lobe nodule on image number 73 series 5, unchanged. A previously demonstrated 7 mm right lower lobe nodule is no longer visualized. A previously demonstrated smaller right lower lobe nodule is also no longer visualized. Mild patchy ground-glass opacity scattered in both lungs with mild progression. Mild increase in prominence of the interstitial markings in both lower lung zones with septal thickening. No pleural fluid. Upper Abdomen: Reflux of contrast into the hepatic veins and inferior vena cava. Artifacts produced by lumbar spine fixation hardware. Musculoskeletal: Stable thoracolumbar spine fixation hardware and T10 vertebral body old compression deformity with mild bony retropulsion. Thoracic and lower cervical spine degenerative changes. Review of the MIP images confirms the above findings. IMPRESSION: 1. No pulmonary emboli. 2. Mild changes of congestive heart failure with right heart failure. 3. Mildly progressive mediastinal and right hilar adenopathy, most likely reactive. 4. Stable bilateral lower lobe pulmonary nodules, previously stable since 2016. The long-term stability is compatible with a benign process. 5.  Calcific coronary artery and  aortic atherosclerosis. Aortic Atherosclerosis (ICD10-I70.0). Electronically Signed   By: Claudie Revering M.D.   On: 06/01/2018 22:40   US Venous Img Lower Unilateral Left  Result Date: 06/01/2018 CLINICAL DATA:   Left lower extremity pain and edema EXAM: Left LOWER EXTREMITY VENOUS DOPPLER ULTRASOUND TECHNIQUE: Gray-scale sonography with graded compression, as well as color Doppler and duplex ultrasound were performed to evaluate the lower extremity deep venous systems from the level of the common femoral vein and including the common femoral, femoral, profunda femoral, popliteal and calf veins including the posterior tibial, peroneal and gastrocnemius veins when visible. The superficial great saphenous vein was also interrogated. Spectral Doppler was utilized to evaluate flow at rest and with distal augmentation maneuvers in the common femoral, femoral and popliteal veins. COMPARISON:  04/14/2017 FINDINGS: Contralateral Common Femoral Vein: Respiratory phasicity is normal and symmetric with the symptomatic side. No evidence of thrombus. Normal compressibility. Common Femoral Vein: No evidence of thrombus. Normal compressibility, respiratory phasicity and response to augmentation. Saphenofemoral Junction: No evidence of thrombus. Normal compressibility and flow on color Doppler imaging. Profunda Femoral Vein: No evidence of thrombus. Normal compressibility and flow on color Doppler imaging. Femoral Vein: No evidence of thrombus. Normal compressibility, respiratory phasicity and response to augmentation. Popliteal Vein: No evidence of thrombus. Normal compressibility, respiratory phasicity and response to augmentation. Calf Veins: Poorly visualized. Other Findings:  None. IMPRESSION: No evidence of deep venous thrombosis. Electronically Signed   By: Donavan Foil M.D.   On: 06/01/2018 20:51   Dg Knee Complete 4 Views Right  Result Date: 06/01/2018 CLINICAL DATA:  Pain, post fall swelling and bruising EXAM: RIGHT KNEE - COMPLETE 4+ VIEW COMPARISON:  07/15/2017 FINDINGS: No fracture or malalignment. Moderate degenerative change medial compartment. No significant knee effusion. Vascular calcifications. IMPRESSION: No acute  osseous abnormality.  Moderate arthritis. Electronically Signed   By: Donavan Foil M.D.   On: 06/01/2018 20:03    Assessment & Plan: Overall her toe is significantly more stable and appears to be doing very well at this point.  The leg cellulitis is also improved as is the swelling.  I think this point she has responded well to the IV antibiotics and it has significantly improved the cellulitis in her toe.  I feel like she can go home at this point from my perspective with topical wet-to-dry saline dressings daily.  I will follow her up on Friday in my office and evaluate the toe and lower leg once again.  I will call and arrange that with her family.  She needs to stay off of his much as possible and keep it elevated to certain extent to reduce swelling in the leg and only wear the postop shoe on the left foot.  Active Problems:   Cellulitis of left lower extremity   Family Communication: Plan discussed with patient   Albertine Patricia M.D on 06/03/2018 at 8:16 AM  Thank you for the consult, we will follow the patient with you in the Hospital.

## 2018-06-14 ENCOUNTER — Other Ambulatory Visit: Payer: Self-pay

## 2018-06-14 ENCOUNTER — Observation Stay
Admission: EM | Admit: 2018-06-14 | Discharge: 2018-06-16 | Disposition: A | Discharge status: Home Health | Payer: Medicare Other | Attending: Internal Medicine | Admitting: Internal Medicine

## 2018-06-14 ENCOUNTER — Emergency Department: Payer: Medicare Other

## 2018-06-14 DIAGNOSIS — E119 Type 2 diabetes mellitus without complications: Secondary | ICD-10-CM | POA: Insufficient documentation

## 2018-06-14 DIAGNOSIS — I482 Chronic atrial fibrillation, unspecified: Secondary | ICD-10-CM | POA: Diagnosis not present

## 2018-06-14 DIAGNOSIS — I110 Hypertensive heart disease with heart failure: Secondary | ICD-10-CM | POA: Diagnosis not present

## 2018-06-14 DIAGNOSIS — L03119 Cellulitis of unspecified part of limb: Secondary | ICD-10-CM

## 2018-06-14 DIAGNOSIS — Z85828 Personal history of other malignant neoplasm of skin: Secondary | ICD-10-CM | POA: Diagnosis not present

## 2018-06-14 DIAGNOSIS — Z950 Presence of cardiac pacemaker: Secondary | ICD-10-CM | POA: Diagnosis not present

## 2018-06-14 DIAGNOSIS — Z7982 Long term (current) use of aspirin: Secondary | ICD-10-CM | POA: Diagnosis not present

## 2018-06-14 DIAGNOSIS — J449 Chronic obstructive pulmonary disease, unspecified: Secondary | ICD-10-CM | POA: Insufficient documentation

## 2018-06-14 DIAGNOSIS — Z833 Family history of diabetes mellitus: Secondary | ICD-10-CM | POA: Insufficient documentation

## 2018-06-14 DIAGNOSIS — M7989 Other specified soft tissue disorders: Secondary | ICD-10-CM | POA: Diagnosis present

## 2018-06-14 DIAGNOSIS — Z66 Do not resuscitate: Secondary | ICD-10-CM | POA: Insufficient documentation

## 2018-06-14 DIAGNOSIS — Z8249 Family history of ischemic heart disease and other diseases of the circulatory system: Secondary | ICD-10-CM | POA: Diagnosis not present

## 2018-06-14 DIAGNOSIS — Z7901 Long term (current) use of anticoagulants: Secondary | ICD-10-CM | POA: Diagnosis not present

## 2018-06-14 DIAGNOSIS — I890 Lymphedema, not elsewhere classified: Secondary | ICD-10-CM | POA: Diagnosis not present

## 2018-06-14 DIAGNOSIS — Z79899 Other long term (current) drug therapy: Secondary | ICD-10-CM | POA: Insufficient documentation

## 2018-06-14 DIAGNOSIS — L03116 Cellulitis of left lower limb: Principal | ICD-10-CM | POA: Insufficient documentation

## 2018-06-14 DIAGNOSIS — I5033 Acute on chronic diastolic (congestive) heart failure: Secondary | ICD-10-CM | POA: Insufficient documentation

## 2018-06-14 DIAGNOSIS — I509 Heart failure, unspecified: Secondary | ICD-10-CM

## 2018-06-14 DIAGNOSIS — Z955 Presence of coronary angioplasty implant and graft: Secondary | ICD-10-CM | POA: Insufficient documentation

## 2018-06-14 DIAGNOSIS — Z7984 Long term (current) use of oral hypoglycemic drugs: Secondary | ICD-10-CM | POA: Diagnosis not present

## 2018-06-14 LAB — BASIC METABOLIC PANEL
Anion gap: 5 (ref 5–15)
BUN: 25 mg/dL — AB (ref 8–23)
CO2: 27 mmol/L (ref 22–32)
CREATININE: 1.09 mg/dL — AB (ref 0.44–1.00)
Calcium: 8.8 mg/dL — ABNORMAL LOW (ref 8.9–10.3)
Chloride: 108 mmol/L (ref 98–111)
GFR calc Af Amer: 51 mL/min — ABNORMAL LOW (ref >60)
GFR calc non Af Amer: 44 mL/min — ABNORMAL LOW (ref >60)
Glucose, Bld: 195 mg/dL — ABNORMAL HIGH (ref 70–99)
Potassium: 4.6 mmol/L (ref 3.5–5.1)
Sodium: 140 mmol/L (ref 135–145)

## 2018-06-14 LAB — CBC WITH DIFFERENTIAL/PLATELET
Abs Immature Granulocytes: 0.02 10*3/uL (ref 0.00–0.07)
Basophils Absolute: 0.1 10*3/uL (ref 0.0–0.1)
Basophils Relative: 1 %
Eosinophils Absolute: 0.4 10*3/uL (ref 0.0–0.5)
Eosinophils Relative: 6 %
HCT: 33.6 % — ABNORMAL LOW (ref 36.0–46.0)
Hemoglobin: 10 g/dL — ABNORMAL LOW (ref 12.0–15.0)
Immature Granulocytes: 0 %
Lymphocytes Relative: 16 %
Lymphs Abs: 1 10*3/uL (ref 0.7–4.0)
MCH: 29.2 pg (ref 26.0–34.0)
MCHC: 29.8 g/dL — ABNORMAL LOW (ref 30.0–36.0)
MCV: 98 fL (ref 80.0–100.0)
MONO ABS: 0.6 10*3/uL (ref 0.1–1.0)
MONOS PCT: 10 %
Neutro Abs: 4.1 10*3/uL (ref 1.7–7.7)
Neutrophils Relative %: 67 %
Platelets: 190 10*3/uL (ref 150–400)
RBC: 3.43 MIL/uL — ABNORMAL LOW (ref 3.87–5.11)
RDW: 15.5 % (ref 11.5–15.5)
WBC: 6.1 10*3/uL (ref 4.0–10.5)
nRBC: 0 % (ref 0.0–0.2)

## 2018-06-14 LAB — BRAIN NATRIURETIC PEPTIDE: B Natriuretic Peptide: 321 pg/mL — ABNORMAL HIGH (ref 0.0–100.0)

## 2018-06-14 LAB — GLUCOSE, CAPILLARY
Glucose-Capillary: 158 mg/dL — ABNORMAL HIGH (ref 70–99)
Glucose-Capillary: 172 mg/dL — ABNORMAL HIGH (ref 70–99)

## 2018-06-14 LAB — TROPONIN I: Troponin I: <0.03 ng/mL (ref <0.03)

## 2018-06-14 LAB — CG4 I-STAT (LACTIC ACID): Lactic Acid, Venous: 0.98 mmol/L (ref 0.5–1.9)

## 2018-06-14 MED ORDER — INSULIN ASPART 100 UNIT/ML ~~LOC~~ SOLN
0.0000 [IU] | Freq: Three times a day (TID) | SUBCUTANEOUS | Status: DC
  Administered 2018-06-14: 2 [IU] via SUBCUTANEOUS
  Administered 2018-06-15: 5 [IU] via SUBCUTANEOUS
  Administered 2018-06-15: 1 [IU] via SUBCUTANEOUS
  Administered 2018-06-16 (×2): 2 [IU] via SUBCUTANEOUS
  Filled 2018-06-14 (×6): qty 1

## 2018-06-14 MED ORDER — PANTOPRAZOLE SODIUM 40 MG PO TBEC
40.0000 mg | DELAYED_RELEASE_TABLET | Freq: Every day | ORAL | Status: DC
  Administered 2018-06-14 – 2018-06-16 (×3): 40 mg via ORAL
  Filled 2018-06-14 (×3): qty 1

## 2018-06-14 MED ORDER — BACLOFEN 10 MG PO TABS
10.0000 mg | ORAL_TABLET | Freq: Two times a day (BID) | ORAL | Status: DC
  Administered 2018-06-14 – 2018-06-16 (×4): 10 mg via ORAL
  Filled 2018-06-14 (×5): qty 1

## 2018-06-14 MED ORDER — SPIRONOLACTONE 25 MG PO TABS
12.5000 mg | ORAL_TABLET | Freq: Every day | ORAL | Status: DC
  Administered 2018-06-14 – 2018-06-16 (×3): 12.5 mg via ORAL
  Filled 2018-06-14: qty 1
  Filled 2018-06-14: qty 0.5
  Filled 2018-06-14: qty 1
  Filled 2018-06-14: qty 0.5
  Filled 2018-06-14: qty 1
  Filled 2018-06-14: qty 0.5

## 2018-06-14 MED ORDER — BISACODYL 5 MG PO TBEC: 5.0000 mg | DELAYED_RELEASE_TABLET | Freq: Every day | ORAL | Status: DC | PRN

## 2018-06-14 MED ORDER — CLINDAMYCIN PHOSPHATE 600 MG/50ML IV SOLN
600.0000 mg | Freq: Once | INTRAVENOUS | Status: AC
  Administered 2018-06-14: 600 mg via INTRAVENOUS
  Filled 2018-06-14 (×2): qty 50

## 2018-06-14 MED ORDER — VITAMIN D3 25 MCG (1000 UNIT) PO TABS
1000.0000 [IU] | ORAL_TABLET | Freq: Every day | ORAL | Status: DC
  Administered 2018-06-14 – 2018-06-16 (×3): 1000 [IU] via ORAL
  Filled 2018-06-14 (×3): qty 1

## 2018-06-14 MED ORDER — VITAMIN B-12 1000 MCG PO TABS
1000.0000 ug | ORAL_TABLET | Freq: Every day | ORAL | Status: DC
  Administered 2018-06-14 – 2018-06-16 (×3): 1000 ug via ORAL
  Filled 2018-06-14 (×4): qty 1

## 2018-06-14 MED ORDER — FUROSEMIDE 10 MG/ML IJ SOLN
20.0000 mg | Freq: Once | INTRAMUSCULAR | Status: AC
  Administered 2018-06-14: 20 mg via INTRAVENOUS
  Filled 2018-06-14: qty 4

## 2018-06-14 MED ORDER — ONDANSETRON HCL 4 MG/2ML IJ SOLN: 4.0000 mg | Freq: Four times a day (QID) | INTRAMUSCULAR | Status: DC | PRN

## 2018-06-14 MED ORDER — TRAZODONE HCL 50 MG PO TABS: 25.0000 mg | ORAL_TABLET | Freq: Every evening | ORAL | Status: DC | PRN

## 2018-06-14 MED ORDER — CARVEDILOL 6.25 MG PO TABS
6.2500 mg | ORAL_TABLET | Freq: Two times a day (BID) | ORAL | Status: DC
  Administered 2018-06-14 – 2018-06-16 (×4): 6.25 mg via ORAL
  Filled 2018-06-14 (×4): qty 1

## 2018-06-14 MED ORDER — SODIUM CHLORIDE 0.9 % IV SOLN
1.0000 g | Freq: Once | INTRAVENOUS | Status: DC
  Filled 2018-06-14: qty 1

## 2018-06-14 MED ORDER — BUPROPION HCL ER (XL) 150 MG PO TB24
150.0000 mg | ORAL_TABLET | Freq: Every day | ORAL | Status: DC
  Administered 2018-06-14 – 2018-06-16 (×3): 150 mg via ORAL
  Filled 2018-06-14 (×3): qty 1

## 2018-06-14 MED ORDER — ONDANSETRON HCL 4 MG PO TABS: 4.0000 mg | ORAL_TABLET | Freq: Four times a day (QID) | ORAL | Status: DC | PRN

## 2018-06-14 MED ORDER — VITAMIN C 500 MG PO TABS
500.0000 mg | ORAL_TABLET | Freq: Every day | ORAL | Status: DC
  Administered 2018-06-14 – 2018-06-16 (×3): 500 mg via ORAL
  Filled 2018-06-14 (×3): qty 1

## 2018-06-14 MED ORDER — ADULT MULTIVITAMIN W/MINERALS CH
1.0000 | ORAL_TABLET | Freq: Every day | ORAL | Status: DC
  Administered 2018-06-14 – 2018-06-16 (×3): 1 via ORAL
  Filled 2018-06-14 (×3): qty 1

## 2018-06-14 MED ORDER — FERROUS SULFATE 325 (65 FE) MG PO TABS
325.0000 mg | ORAL_TABLET | Freq: Every day | ORAL | Status: DC
  Administered 2018-06-15 – 2018-06-16 (×2): 325 mg via ORAL
  Filled 2018-06-14 (×2): qty 1

## 2018-06-14 MED ORDER — VITAMIN D (ERGOCALCIFEROL) 1.25 MG (50000 UNIT) PO CAPS: 50000.0000 [IU] | ORAL_CAPSULE | ORAL | Status: DC

## 2018-06-14 MED ORDER — RIVAROXABAN 15 MG PO TABS
15.0000 mg | ORAL_TABLET | Freq: Every day | ORAL | Status: DC
  Administered 2018-06-14 – 2018-06-16 (×3): 15 mg via ORAL
  Filled 2018-06-14 (×3): qty 1

## 2018-06-14 MED ORDER — FUROSEMIDE 10 MG/ML IJ SOLN
40.0000 mg | Freq: Two times a day (BID) | INTRAMUSCULAR | Status: DC
  Administered 2018-06-14 – 2018-06-16 (×4): 40 mg via INTRAVENOUS
  Filled 2018-06-14 (×4): qty 4

## 2018-06-14 MED ORDER — GABAPENTIN 100 MG PO CAPS
100.0000 mg | ORAL_CAPSULE | Freq: Every day | ORAL | Status: DC
  Administered 2018-06-14: 200 mg via ORAL
  Administered 2018-06-15: 100 mg via ORAL
  Filled 2018-06-14 (×2): qty 2

## 2018-06-14 MED ORDER — LINAGLIPTIN 5 MG PO TABS
5.0000 mg | ORAL_TABLET | Freq: Every day | ORAL | Status: DC
  Administered 2018-06-14 – 2018-06-16 (×3): 5 mg via ORAL
  Filled 2018-06-14 (×3): qty 1

## 2018-06-14 MED ORDER — ACETAMINOPHEN 650 MG RE SUPP: 650.0000 mg | Freq: Four times a day (QID) | RECTAL | Status: DC | PRN

## 2018-06-14 MED ORDER — MELATONIN 5 MG PO TABS
5.0000 mg | ORAL_TABLET | Freq: Every day | ORAL | Status: DC
  Administered 2018-06-14 – 2018-06-15 (×2): 5 mg via ORAL
  Filled 2018-06-14 (×3): qty 1

## 2018-06-14 MED ORDER — MELATONIN 1 MG PO TABS
1.0000 mg | ORAL_TABLET | Freq: Every day | ORAL | Status: DC
  Filled 2018-06-14: qty 1

## 2018-06-14 MED ORDER — OXYCODONE HCL 5 MG PO TABS
5.0000 mg | ORAL_TABLET | Freq: Four times a day (QID) | ORAL | Status: DC | PRN
  Administered 2018-06-14 – 2018-06-15 (×2): 10 mg via ORAL
  Filled 2018-06-14 (×2): qty 2

## 2018-06-14 MED ORDER — ESCITALOPRAM OXALATE 10 MG PO TABS
10.0000 mg | ORAL_TABLET | Freq: Every day | ORAL | Status: DC
  Administered 2018-06-14 – 2018-06-16 (×3): 10 mg via ORAL
  Filled 2018-06-14 (×3): qty 1

## 2018-06-14 MED ORDER — ROPINIROLE HCL 0.25 MG PO TABS
0.5000 mg | ORAL_TABLET | Freq: Two times a day (BID) | ORAL | Status: DC
  Administered 2018-06-14 – 2018-06-16 (×4): 0.5 mg via ORAL
  Filled 2018-06-14 (×5): qty 2

## 2018-06-14 MED ORDER — DOCUSATE SODIUM 100 MG PO CAPS
100.0000 mg | ORAL_CAPSULE | Freq: Two times a day (BID) | ORAL | Status: DC
  Administered 2018-06-14 – 2018-06-16 (×4): 100 mg via ORAL
  Filled 2018-06-14 (×4): qty 1

## 2018-06-14 MED ORDER — MAGNESIUM OXIDE 400 (241.3 MG) MG PO TABS
400.0000 mg | ORAL_TABLET | Freq: Every day | ORAL | Status: DC
  Administered 2018-06-14 – 2018-06-16 (×3): 400 mg via ORAL
  Filled 2018-06-14 (×3): qty 1

## 2018-06-14 MED ORDER — GLIMEPIRIDE 4 MG PO TABS
4.0000 mg | ORAL_TABLET | Freq: Two times a day (BID) | ORAL | Status: DC
  Administered 2018-06-14 – 2018-06-16 (×4): 4 mg via ORAL
  Filled 2018-06-14 (×5): qty 1

## 2018-06-14 MED ORDER — ACETAMINOPHEN 325 MG PO TABS: 650.0000 mg | ORAL_TABLET | Freq: Four times a day (QID) | ORAL | Status: DC | PRN

## 2018-06-14 MED ORDER — SODIUM CHLORIDE 0.9 % IV SOLN
1.0000 g | Freq: Two times a day (BID) | INTRAVENOUS | Status: DC
  Administered 2018-06-14 – 2018-06-15 (×2): 1 g via INTRAVENOUS
  Filled 2018-06-14 (×3): qty 1

## 2018-06-14 NOTE — ED Notes (Signed)
.   Pt is resting, Respirations even and unlabored, NAD. Stretcher lowest postion and locked. Call bell within reach. Denies any needs at this time RN will continue to monitor.  Family at bedside.  

## 2018-06-14 NOTE — ED Provider Notes (Signed)
[facility] Emergency Department Provider Note  ____________________________________________   First MD Initiated Contact with Patient 06/14/18 1107     (approximate)  I have reviewed the triage vital signs and the nursing notes.   HISTORY  Chief Complaint Recurrent Skin Infections   HPI [name] is a 84 y.o. female with a history of CHF, COPD on as needed oxygen who was presented emergency department with bilateral lower extremity swelling.  The patient states that she has been unable to sleep for the past 2-3 nights because of pain in her bilateral lower extremities as well as her back.  She was recently admitted to the hospital and discharged this past Monday for Pseudomonas infection to a left plantar lesion.  Patient seen in the emergency department today because of worsening swelling to the bilateral lower extremities as well as worsening back pain but also has not had her oxycodone over the past 2 days.  Denies difficulty breathing.  However, family friend states that her respirations appear more labored.    Past Medical History:  Diagnosis Date  . Cancer ([place])    skin ca  . CHF (congestive heart failure) ([place])   . Chronic back pain   . COPD (chronic obstructive pulmonary disease) ([place])   . Diabetes mellitus without complication ([place])   . Hypertension   . Poor perfusion of leg     Patient Active Problem List   Diagnosis Date Noted  . Cellulitis of left lower extremity 06/02/2018  . ARF (acute renal failure) ([place]) 05/17/2018  . Pneumonia 12/11/2017  . CAP (community acquired pneumonia) 04/14/2017  . COPD (chronic obstructive pulmonary disease) ([place]) 12/07/2016  . UTI (urinary tract infection) 12/07/2016  . Ventral hernia without obstruction or gangrene   . Hyponatremia 09/07/2015  . Acute respiratory failure ([place]) 09/01/2015  . Atrial fibrillation ([place]) 04/10/2015  . Essential hypertension 04/10/2015  . Diabetes ([place]) 04/10/2015    . Acute on chronic combined systolic and diastolic CHF (congestive heart failure) ([place]) 03/21/2015  . Pulmonary nodule 11/10/2014    Past Surgical History:  Procedure Laterality Date  . ABDOMINAL HYSTERECTOMY    . BACK SURGERY     x3  . CORONARY STENT PLACEMENT     unknown location per pt  . FOOT SURGERY    . LEG SURGERY    . PACEMAKER PLACEMENT    . TONSILLECTOMY      Prior to Admission medications   Medication Sig Start Date End Date Taking? Authorizing Provider  acetaminophen (TYLENOL) 325 MG tablet Take 2 tablets (650 mg total) by mouth every 6 (six) hours as needed for mild pain (or Fever >/= 101). Patient not taking: Reported on 06/02/2018 04/18/17   Nicholes Mango, MD  aspirin EC 81 MG tablet Take 81 mg by mouth daily.    [provider]  baclofen (LIORESAL) 10 MG tablet Take 10 mg by mouth 3 (three) times daily as needed.     [provider]  buPROPion (WELLBUTRIN XL) 150 MG 24 hr tablet Take 1 tablet by mouth daily. 11/28/17   [provider]  carvedilol (COREG) 6.25 MG tablet Take 6.25 mg by mouth 2 (two) times daily with a meal.     [provider]  cholecalciferol (VITAMIN D) 1000 units tablet Take 1,000 Units by mouth daily.    [provider]  docusate sodium (COLACE) 100 MG capsule Take 100 mg by mouth 2 (two) times daily.     [provider]  escitalopram (LEXAPRO) 10 MG tablet Take 10 mg by mouth daily.    [provider]  ferrous sulfate 325 (65 FE) MG tablet Take 325 mg by mouth daily with breakfast.    [provider]  furosemide (LASIX) 40 MG tablet Take 0.5 tablets (20 mg total) by mouth daily. 05/18/18   Salary, Avel Peace, MD  gabapentin (NEURONTIN) 100 MG capsule Take 100-200 mg by mouth at bedtime.    [provider]  glimepiride (AMARYL) 4 MG tablet Take 4 mg by mouth 2 (two) times daily. 07/01/17   [provider]  Insulin Pen Needle 31G X 5 MM MISC 4 Syringes by Does not  apply route 4 (four) times daily -  before meals and at bedtime. Use 3 times daily with meals and at bedtime as needed 06/03/18 07/03/18  Nicholes Mango, MD  magnesium oxide (MAG-OX) 400 MG tablet Take 400 mg by mouth daily.    [provider]  Melatonin 1 MG TABS Take 1 mg by mouth at bedtime.    [provider]  metFORMIN (GLUCOPHAGE) 500 MG tablet Take 1 tablet by mouth daily. 12/10/17   [provider]  Multiple Vitamin (MULTIVITAMIN) tablet Take 1 tablet by mouth daily.    [provider]  oxycodone (OXY-IR) 5 MG capsule Take 1-2 capsules (5-10 mg total) by mouth every 6 (six) hours as needed (MOD SEVERE pain). 06/03/18   Gouru, Illene Silver, MD  pantoprazole (PROTONIX) 40 MG tablet Take 40 mg by mouth daily.    [provider]  PROAIR HFA 108 [phone] Base) MCG/ACT inhaler Inhale 2 puffs into the lungs every 6 (six) hours as needed for wheezing or shortness of breath.  10/02/17   [provider]  Rivaroxaban (XARELTO) 15 MG TABS tablet Take 15 mg by mouth daily.    [provider]  rOPINIRole (REQUIP) 0.5 MG tablet Take 0.5 mg by mouth 2 (two) times daily.    [provider]  sitaGLIPtin (JANUVIA) 100 MG tablet Take 100 mg by mouth daily. 12/06/16   [provider]  spironolactone (ALDACTONE) 25 MG tablet Take 0.5 tablets (12.5 mg total) by mouth daily. 05/18/18   Salary, Avel Peace, MD  vitamin B-12 (CYANOCOBALAMIN) 1000 MCG tablet Take 1,000 mcg by mouth daily.    [provider]  vitamin C (ASCORBIC ACID) 500 MG tablet Take 500 mg by mouth daily.    [provider]  Vitamin D, Ergocalciferol, (DRISDOL) 50000 UNITS CAPS capsule Take 50,000 Units by mouth every 7 (seven) days. Pt takes on Thursday.    [provider]    Allergies Ceftriaxone; Cefuroxime; Cephalosporins; Codeine; Epinephrine; Morphine and related; Buprenorphine hcl; Ciprofloxacin; Latex; Lidocaine; and Procaine  Family History  Problem  Relation Age of Onset  . CVA Mother        Congestive Heart Failure, heart attack, hypertension, stroke  . Diabetes Mellitus II Sister   . Heart disease Father        Congestive Heart Failure, heart attack, hypertension    Social History Social History   Tobacco Use  . Smoking status: Never Smoker  . Smokeless tobacco: Never Used  Substance Use Topics  . Alcohol use: No    Alcohol/week: 0.0 standard drinks  . Drug use: No    Review of Systems  Constitutional: No fever/chills Eyes: No visual changes. ENT: No sore throat. Cardiovascular: Denies chest pain. Respiratory: As above Gastrointestinal: No abdominal pain.  No nausea, no vomiting.  No diarrhea.  No  constipation. Genitourinary: Negative for dysuria. Musculoskeletal: As above  skin: Negative for rash. Neurological: Negative for headaches, focal weakness or numbness.   ____________________________________________   PHYSICAL EXAM:  VITAL SIGNS: ED Triage Vitals  Enc Vitals Group     BP 06/14/18 1100 (!) 145/66     Pulse Rate 06/14/18 1100 75     Resp 06/14/18 1115 (!) 24     Temp 06/14/18 1104 (!) 97.5 F (36.4 C)     Temp Source 06/14/18 1104 Oral     SpO2 06/14/18 1100 98 %     Weight 06/14/18 1105 172 lb 13.5 oz (78.4 kg)     Height 06/14/18 1116 5\' 6"  (1.676 m)     Head Circumference --      Peak Flow --      Pain Score 06/14/18 1105 3     Pain Loc --      Pain Edu? --      Excl. in [place]? --     Constitutional: Alert and oriented. Well appearing and in no acute distress. Eyes: Conjunctivae are normal.  Head: Atraumatic. Nose: No congestion/rhinnorhea.  Wearing nasal cannula oxygen.   Mouth/Throat: Mucous membranes are moist.  Neck: No stridor.   Cardiovascular: Normal rate, regular rhythm. Grossly normal heart sounds.  Good peripheral circulation with equal and bilateral dorsalis pedis pulses. Respiratory: Normal respiratory effort.  No retractions. Lungs CTAB. Gastrointestinal: Soft and  nontender. No distention. No CVA tenderness. Musculoskeletal: Moderate to severe bilateral lower extremity edema without any erythema, induration or exudate.  Mild tenderness to palpation.  Left foot with plantar lesion that is approximately 2 cm in diameter and round no surrounding erythema, induration or pus.  Mild tenderness to palpation. Neurologic:  Normal speech and language. No gross focal neurologic deficits are appreciated. Skin:  Skin is warm, dry and intact. No rash noted. Psychiatric: Mood and affect are normal. Speech and behavior are normal.  ____________________________________________   LABS (all labs ordered are listed, but only abnormal results are displayed)  Labs Reviewed  BASIC METABOLIC PANEL - Abnormal; Notable for the following components:      Result Value   Glucose, Bld 195 (*)    BUN 25 (*)    Creatinine, Ser 1.09 (*)    Calcium 8.8 (*)    GFR calc non Af Amer 44 (*)    GFR calc Af Amer 51 (*)    All other components within normal limits  CBC WITH DIFFERENTIAL/PLATELET - Abnormal; Notable for the following components:   RBC 3.43 (*)    Hemoglobin 10.0 (*)    HCT 33.6 (*)    MCHC 29.8 (*)    All other components within normal limits  CBC  TROPONIN I  BRAIN NATRIURETIC PEPTIDE  CG4 I-STAT (LACTIC ACID)  I-STAT CG4 LACTIC ACID, ED   ____________________________________________  EKG  ED ECG REPORT I, Doran Stabler, the attending physician, personally viewed and interpreted this ECG.   Date: 06/14/2018  EKG Time: 1105  Rate: 91  Rhythm: Paced rhythm  Axis: Normal  Intervals:Wide-complex secondary to paced rhythm  ST&T Change: T wave inversions in 1 and aVL.  No ST segment elevation or depression.  ____________________________________________  RADIOLOGY  Chest x-ray with cardiomegaly and vascular congestion and mild basilar interstitial edema. ____________________________________________   PROCEDURES  Procedure(s) performed:    Procedures  Critical Care performed:   ____________________________________________   INITIAL IMPRESSION / ASSESSMENT AND PLAN / ED COURSE  Pertinent labs & imaging results that were  available during my care of the patient were reviewed by me and considered in my medical decision making (see chart for details).  DDX: CHF, cellulitis, electrolyte abnormality, dehydration, pneumonia As part of my medical decision making, I reviewed the following data within the [facility] Notes from prior ED visits  ----------------------------------------- 2:20 PM on 06/14/2018 -----------------------------------------  Patient with CHF as well as evidence of cellulitis.  Daughter now at the bedside and states that the patient has been admitted approximately every 2 weeks for IV antibiotics that she is unable to tolerate the p.o. antibiotics to cover the bacteria that has been identified, Pseudomonas, as infecting her lower extremities.  She says the lower extremities appear cellulitic as they have in the past.  Patient was also to follow-up for IM injection of ceftriaxone as an outpatient but because the lower extremities had been looking better the injection was foregone.  Will order antibiotics at this time.  Patient will likely also benefit from gentle diuresis but patient in the past has had kidney failure and this will need to be done with great care.  Patient to be admitted to the hospital.  Family as well as patient aware.  Signed out to Dr. Posey Pronto of the medicine service. ____________________________________________   FINAL CLINICAL IMPRESSION(S) / ED DIAGNOSES  CHF.  Cellulitis.  NEW MEDICATIONS STARTED DURING THIS VISIT:  New Prescriptions   No medications on file     Note:  This document was prepared using Dragon voice recognition software and may include unintentional dictation errors.     Orbie Pyo, MD 06/14/18 [phone]

## 2018-06-14 NOTE — ED Notes (Signed)
Pt O2 monitor on RA pt desaturation  to 91 Pt was placed on 2L O2 and is currently at 95%. Pt only requires O2 at night according to EMT

## 2018-06-14 NOTE — ED Notes (Signed)
.   Pt is resting, Respirations even and unlabored, NAD. Stretcher lowest postion and locked. Call bell within reach. Denies any needs at this time RN will continue to monitor.    

## 2018-06-14 NOTE — ED Triage Notes (Signed)
Pt presents via acems with c/o cellulitis. Pt is from home and has celllulitis to the left leg. LEg pain for 2 mths in and out of hosiptal bilateral. Pt has pressure ulcer that is on her bottom. Pt states she is not on antibiotics. Pt has [place] and lives with husband. VSS   cellulitis has not extended up calf from toe. Pt has Bilateral swelling and pt states more red than normal.   182/92 91 71 hr paced  293 CBG 20g RAC

## 2018-06-14 NOTE — Progress Notes (Signed)
Pt takes 2 oxycodone IR at bed time for chronic back pain at home, this medication was not continued here. Dr. Posey Pronto paged, he gave orders to continue this medication. Will give and continue to monitor. Conley Simmonds, RN, BSN

## 2018-06-14 NOTE — Consult Note (Signed)
Pharmacy Antibiotic Note  Emilya Hoffman is a 84 y.o. female admitted on 06/14/2018 with cellulitis.  Pharmacy has been consulted for meropenem dosing.  Plan: Meropenem 1 gm every 12 hours (renally adjusted)  Height: 5\' 6"  (167.6 cm) Weight: 172 lb 13.5 oz (78.4 kg) IBW/kg (Calculated) : 59.3  Temp (24hrs), Avg:97.5 F (36.4 C), Min:97.5 F (36.4 C), Max:97.5 F (36.4 C)  Recent Labs  Lab 06/14/18 1106 06/14/18 1114  WBC 6.1  --   CREATININE 1.09*  --   LATICACIDVEN  --  0.98    Estimated Creatinine Clearance: 35.5 mL/min (A) (by C-G formula based on SCr of 1.09 mg/dL (H)).    Allergies  Allergen Reactions  . Ceftriaxone Swelling  . Cefuroxime Hives  . Cephalosporins Swelling and Other (See Comments)    Reaction:  Fainting/dry mouth   . Codeine Nausea And Vomiting  . Epinephrine Other (See Comments)    Reaction:  Fainting   . Morphine And Related Other (See Comments)    Reaction:  GI upset   . Buprenorphine Hcl Nausea And Vomiting  . Ciprofloxacin Itching, Swelling and Rash  . Latex Rash  . Lidocaine Rash  . Procaine Rash    Antimicrobials this admission: Meropenem 12/22 >>    Dose adjustments this admission:   Microbiology results:  BCx:   UCx:    Sputum:    MRSA PCR:   Thank you for allowing pharmacy to be a part of this patient's care.  Forrest Moron, PharmD Clinical Pharmacist 06/14/2018 4:03 PM

## 2018-06-14 NOTE — H&P (Signed)
[facility] [place] [name]    MR#:  [id number]  DATE OF BIRTH:  07/24/1924  DATE OF ADMISSION:  06/14/2018  PRIMARY CARE PHYSICIAN: Tracie Harrier, MD   REQUESTING/REFERRING PHYSICIAN: Dr. Clearnce Hasten  CHIEF COMPLAINT: Shortness of breath   Chief Complaint  Patient presents with  . Recurrent Skin Infections    HISTORY OF PRESENT ILLNESS:  [name]  is a 84 y.o. female with a known history of diabetes mellitus type 2, COPD, essential hypertension, recent Pseudomonas infection comes in with shortness of breath for last 3 to 4 days, left leg pain, bilateral leg swelling.  Patient was seen in the hospital and admitted recently and discharged on December 11 for Pseudomonas left leg cellulitis, that time seen by podiatry, discharged home, patient supposed to see Dr. Elvina Mattes for IM Rocephin which she received, patient that time discharged with home health nurse for dressing changes.  But comes back again because of shortness of breath, worsening leg swelling.  Patient blood work is essentially within normal limits but has slightly elevated BNP, chest x-ray concerning for CHF.  PAST MEDICAL HISTORY:   Past Medical History:  Diagnosis Date  . Cancer ([place])    skin ca  . CHF (congestive heart failure) ([place])   . Chronic back pain   . COPD (chronic obstructive pulmonary disease) ([place])   . Diabetes mellitus without complication ([place])   . Hypertension   . Poor perfusion of leg     PAST SURGICAL HISTOIRY:   Past Surgical History:  Procedure Laterality Date  . ABDOMINAL HYSTERECTOMY    . BACK SURGERY     x3  . CORONARY STENT PLACEMENT     unknown location per pt  . FOOT SURGERY    . LEG SURGERY    . PACEMAKER PLACEMENT    . TONSILLECTOMY      SOCIAL HISTORY:   Social History   Tobacco Use  . Smoking status: Never Smoker  . Smokeless tobacco: Never Used  Substance Use Topics  . Alcohol use: No    Alcohol/week:  0.0 standard drinks    FAMILY HISTORY:   Family History  Problem Relation Age of Onset  . CVA Mother        Congestive Heart Failure, heart attack, hypertension, stroke  . Diabetes Mellitus II Sister   . Heart disease Father        Congestive Heart Failure, heart attack, hypertension    DRUG ALLERGIES:   Allergies  Allergen Reactions  . Ceftriaxone Swelling  . Cefuroxime Hives  . Cephalosporins Swelling and Other (See Comments)    Reaction:  Fainting/dry mouth   . Codeine Nausea And Vomiting  . Epinephrine Other (See Comments)    Reaction:  Fainting   . Morphine And Related Other (See Comments)    Reaction:  GI upset   . Buprenorphine Hcl Nausea And Vomiting  . Ciprofloxacin Itching, Swelling and Rash  . Latex Rash  . Lidocaine Rash  . Procaine Rash    REVIEW OF SYSTEMS:  CONSTITUTIONAL: No fever, fatigue or weakness.  EYES: No blurred or double vision.  EARS, NOSE, AND THROAT: No tinnitus or ear pain.  RESPIRATORY shortness of breath, bilateral leg edema. CARDIOVASCULAR: No chest pain, orthopnea, edema.  GASTROINTESTINAL: No nausea, vomiting, diarrhea or abdominal pain.  GENITOURINARY: No dysuria, hematuria.  ENDOCRINE: No polyuria, nocturia,  HEMATOLOGY: No anemia, easy bruising or bleeding SKIN: No rash or lesion. MUSCULOSKELETAL: Noted to  have wound on the left great toe.  Has a dry eschar. NEUROLOGIC: No tingling, numbness, weakness.  PSYCHIATRY: No anxiety or depression.   MEDICATIONS AT HOME:   Prior to Admission medications   Medication Sig Start Date End Date Taking? Authorizing Provider  acetaminophen (TYLENOL) 325 MG tablet Take 2 tablets (650 mg total) by mouth every 6 (six) hours as needed for mild pain (or Fever >/= 101). Patient not taking: Reported on 06/02/2018 04/18/17   Nicholes Mango, MD  aspirin EC 81 MG tablet Take 81 mg by mouth daily.    [provider]  baclofen (LIORESAL) 10 MG tablet Take 10 mg by mouth 3 (three) times daily as  needed.     [provider]  buPROPion (WELLBUTRIN XL) 150 MG 24 hr tablet Take 1 tablet by mouth daily. 11/28/17   [provider]  carvedilol (COREG) 6.25 MG tablet Take 6.25 mg by mouth 2 (two) times daily with a meal.     [provider]  cholecalciferol (VITAMIN D) 1000 units tablet Take 1,000 Units by mouth daily.    [provider]  docusate sodium (COLACE) 100 MG capsule Take 100 mg by mouth 2 (two) times daily.     [provider]  escitalopram (LEXAPRO) 10 MG tablet Take 10 mg by mouth daily.    [provider]  ferrous sulfate 325 (65 FE) MG tablet Take 325 mg by mouth daily with breakfast.    [provider]  furosemide (LASIX) 40 MG tablet Take 0.5 tablets (20 mg total) by mouth daily. 05/18/18   Salary, Avel Peace, MD  gabapentin (NEURONTIN) 100 MG capsule Take 100-200 mg by mouth at bedtime.    [provider]  glimepiride (AMARYL) 4 MG tablet Take 4 mg by mouth 2 (two) times daily. 07/01/17   [provider]  Insulin Pen Needle 31G X 5 MM MISC 4 Syringes by Does not apply route 4 (four) times daily -  before meals and at bedtime. Use 3 times daily with meals and at bedtime as needed 06/03/18 07/03/18  Nicholes Mango, MD  magnesium oxide (MAG-OX) 400 MG tablet Take 400 mg by mouth daily.    [provider]  Melatonin 1 MG TABS Take 1 mg by mouth at bedtime.    [provider]  metFORMIN (GLUCOPHAGE) 500 MG tablet Take 1 tablet by mouth daily. 12/10/17   [provider]  Multiple Vitamin (MULTIVITAMIN) tablet Take 1 tablet by mouth daily.    [provider]  oxycodone (OXY-IR) 5 MG capsule Take 1-2 capsules (5-10 mg total) by mouth every 6 (six) hours as needed (MOD SEVERE pain). 06/03/18   Gouru, Illene Silver, MD  pantoprazole (PROTONIX) 40 MG tablet Take 40 mg by mouth daily.    [provider]  PROAIR HFA 108 [phone] Base) MCG/ACT inhaler Inhale 2 puffs into the lungs every 6  (six) hours as needed for wheezing or shortness of breath.  10/02/17   [provider]  Rivaroxaban (XARELTO) 15 MG TABS tablet Take 15 mg by mouth daily.    [provider]  rOPINIRole (REQUIP) 0.5 MG tablet Take 0.5 mg by mouth 2 (two) times daily.    [provider]  sitaGLIPtin (JANUVIA) 100 MG tablet Take 100 mg by mouth daily. 12/06/16   [provider]  spironolactone (ALDACTONE) 25 MG tablet Take 0.5 tablets (12.5 mg total) by mouth daily. 05/18/18   Salary, Avel Peace, MD  vitamin B-12 (CYANOCOBALAMIN) 1000 MCG  tablet Take 1,000 mcg by mouth daily.    [provider]  vitamin C (ASCORBIC ACID) 500 MG tablet Take 500 mg by mouth daily.    [provider]  Vitamin D, Ergocalciferol, (DRISDOL) 50000 UNITS CAPS capsule Take 50,000 Units by mouth every 7 (seven) days. Pt takes on Thursday.    [provider]      VITAL SIGNS:  Blood pressure (!) 145/68, pulse 70, temperature (!) 97.5 F (36.4 C), temperature source Oral, resp. rate (!) 24, height 5\' 6"  (1.676 m), weight 78.4 kg, SpO2 91 %.  PHYSICAL EXAMINATION:  GENERAL:  84 y.o.-year-old patient lying in the bed with no acute distress.  EYES: Pupils equal, round, reactive to light and accommodation. No scleral icterus. Extraocular muscles intact.  HEENT: Head atraumatic, normocephalic. Oropharynx and nasopharynx clear.  NECK:  Supple, no jugular venous distention. No thyroid enlargement, no tenderness.  LUNGS: Normal breath sounds bilaterally, no wheezing, rales,rhonchi or crepitation. No use of accessory muscles of respiration.  CARDIOVASCULAR: S1, S2 normal. No murmurs, rubs, or gallops.  ABDOMEN: Soft, nontender, nondistended. Bowel sounds present. No organomegaly or mass.  EXTREMITIES: No pedal edema, cyanosis, or clubbing.  NEUROLOGIC: Cranial nerves II through XII are intact. Muscle strength 5/5 in all extremities. Sensation intact. Gait not checked.  PSYCHIATRIC: The  patient is alert and oriented x 3.  SKIN: No obvious rash, lesion, or ulcer.   LABORATORY PANEL:   CBC Recent Labs  Lab 06/14/18 1106  WBC 6.1  HGB 10.0*  HCT 33.6*  PLT 190   ------------------------------------------------------------------------------------------------------------------  Chemistries  Recent Labs  Lab 06/14/18 1106  NA 140  K 4.6  CL 108  CO2 27  GLUCOSE 195*  BUN 25*  CREATININE 1.09*  CALCIUM 8.8*   ------------------------------------------------------------------------------------------------------------------  Cardiac Enzymes Recent Labs  Lab 06/14/18 1106  TROPONINI <0.03   ------------------------------------------------------------------------------------------------------------------  RADIOLOGY:  Dg Chest 1 View  Result Date: 06/14/2018 CLINICAL DATA:  Difficulty breathing EXAM: CHEST  1 VIEW COMPARISON:  06/01/2018 FINDINGS: Stable cardiomegaly with vascular congestion and mild interstitial prominence with basilar Kerley B-lines suggesting early edema. No large effusion or pneumothorax. No definite focal pneumonia, collapse or consolidation. Left subclavian 2 lead pacer noted. Aorta atherosclerotic. Degenerative changes of the spine and partial imaging of the thoracolumbar fusion hardware. Apical scarring present. IMPRESSION: Cardiomegaly with vascular congestion and mild basilar interstitial edema pattern. Electronically Signed   By: Jerilynn Mages.  Shick M.D.   On: 06/14/2018 11:53    EKG:   Orders placed or performed during the hospital encounter of 06/14/18  . EKG 12-Lead  . EKG 12-Lead  . ED EKG  . ED EKG  . EKG 12-Lead  . EKG 12-Lead    IMPRESSION AND PLAN:   84 year old female with multiple medical problems of essential hypertension, diabetes mellitus type 2, COPD, chronic diastolic heart failure, recent left leg infection comes in because of worsening shortness of breath. 1.  Acute on chronic diastolic heart failure: Admit to  telemetry, start IV Lasix, continue to monitor on telemetry, patient blood work is essentially normal so monitor on IV Lasix on telemetry, likely discharge home tomorrow. 2.  Recent left leg cellulitis, admitted and discharged on December 11 that time she received meropenem, seen by Dr. Elvina Mattes received IM Rocephin in the office, discharged with wet-to-dry dressing, patient does have some mild left leg great toe wound which is not bad there is no redness, tenderness surrounding that.  No drainage also.  Continue meropenem while in the hospital,  can see Dr. Elvina Mattes tomorrow before discharge.  Patient has history of osteomyelitis of the left foot, hallux  deformity.  Wound care will be consulted for dressing changes.  Patient is advised to stay off as much as possible keep the leg elevated to decrease the swelling.  And also has to have a have a postop shoe for the left foot. 3.  Chronic lymphedema of the legs. 4.  Diabetes mellitus type 2: Not insulin-dependent.  Continue metformin, glipizide, add sliding scale insulin with coverage. 5.  Essential hypertension: Continue Coreg, already on Lasix for CHF 6.  History of chronic A. fib, patient is on Coreg, Xarelto.  Continue that.    All the records are reviewed and case discussed with ED provider. Management plans discussed with the patient, family and they are in agreement.  CODE STATUS: DNR, confirmed with  patient  TOTAL TIME TAKING CARE OF THIS PATIENT: 94minutes.    Epifanio Lesches M.D on 06/14/2018 at 3:02 PM  Between 7am to 6pm - Pager - [phone]  After 6pm go to [email or web address] - password [email or web address] [facility]  Office  [phone]  CC: Primary care physician; Tracie Harrier, MD  Note: This dictation was prepared with Dragon dictation along with smaller phrase technology. Any transcriptional errors that result from this process are unintentional.

## 2018-06-14 NOTE — Progress Notes (Signed)
Family Meeting Note  Advance Directive:yes  Today a meeting took place with the Patient.  Patient has multiple medical problems of essential hypertension, diabetes mellitus type 2, A. fib, discussed multiple medical problems with her and she told me that she is DNR.  TThe following were discussed:Patient's diagnosis: , Patient's progosis: Unable to determine and Goals for treatment: DNR  Additional follow-up to be provided:   Time spent during discussion:16 min  Epifanio Lesches, MD

## 2018-06-15 LAB — BASIC METABOLIC PANEL
Anion gap: 10 (ref 5–15)
BUN: 27 mg/dL — ABNORMAL HIGH (ref 8–23)
CO2: 29 mmol/L (ref 22–32)
Calcium: 8.8 mg/dL — ABNORMAL LOW (ref 8.9–10.3)
Chloride: 102 mmol/L (ref 98–111)
Creatinine, Ser: 0.99 mg/dL (ref 0.44–1.00)
GFR calc non Af Amer: 50 mL/min — ABNORMAL LOW (ref >60)
GFR, EST AFRICAN AMERICAN: 58 mL/min — AB (ref >60)
Glucose, Bld: 163 mg/dL — ABNORMAL HIGH (ref 70–99)
Potassium: 4.2 mmol/L (ref 3.5–5.1)
SODIUM: 141 mmol/L (ref 135–145)

## 2018-06-15 LAB — GLUCOSE, CAPILLARY
Glucose-Capillary: 136 mg/dL — ABNORMAL HIGH (ref 70–99)
Glucose-Capillary: 262 mg/dL — ABNORMAL HIGH (ref 70–99)
Glucose-Capillary: 124 mg/dL — ABNORMAL HIGH (ref 70–99)
Glucose-Capillary: 155 mg/dL — ABNORMAL HIGH (ref 70–99)

## 2018-06-15 LAB — CBC
HCT: 33 % — ABNORMAL LOW (ref 36.0–46.0)
Hemoglobin: 9.8 g/dL — ABNORMAL LOW (ref 12.0–15.0)
MCH: 29 pg (ref 26.0–34.0)
MCHC: 29.7 g/dL — ABNORMAL LOW (ref 30.0–36.0)
MCV: 97.6 fL (ref 80.0–100.0)
NRBC: 0 % (ref 0.0–0.2)
Platelets: 193 10*3/uL (ref 150–400)
RBC: 3.38 MIL/uL — ABNORMAL LOW (ref 3.87–5.11)
RDW: 15.2 % (ref 11.5–15.5)
WBC: 7.3 10*3/uL (ref 4.0–10.5)

## 2018-06-15 MED ORDER — SODIUM CHLORIDE 0.9% FLUSH: 3.0000 mL | INTRAVENOUS | Status: DC | PRN

## 2018-06-15 MED ORDER — SODIUM CHLORIDE 0.9% FLUSH
3.0000 mL | Freq: Two times a day (BID) | INTRAVENOUS | Status: DC
  Administered 2018-06-15 – 2018-06-16 (×3): 3 mL via INTRAVENOUS

## 2018-06-15 MED ORDER — SODIUM CHLORIDE 0.9 % IV SOLN
INTRAVENOUS | Status: DC | PRN
  Administered 2018-06-15: 500 mL via INTRAVENOUS

## 2018-06-15 NOTE — Plan of Care (Signed)
  Problem: Activity: Goal: Risk for activity intolerance will decrease Outcome: Progressing   Problem: Elimination: Goal: Will not experience complications related to urinary retention Outcome: Progressing Note:  Receiving IV lasix, on an external catheter, having good output   Problem: Pain Managment: Goal: General experience of comfort will improve Outcome: Progressing Note:  Complaints of chronic back pain, treated once with oxycodone which gave relief   Problem: Safety: Goal: Ability to remain free from injury will improve Outcome: Progressing   Problem: Education: Goal: Knowledge of General Education information will improve Description Including pain rating scale, medication(s)/side effects and non-pharmacologic comfort measures Outcome: Completed/Met

## 2018-06-15 NOTE — Progress Notes (Signed)
[facility] at [place] [name]    MR#:  [id number]  DATE OF BIRTH:  February 05, 1925  SUBJECTIVE: Admitted for shortness of breath and found to have acute on chronic diastolic heart failure, started on IV Lasix, today she feels much better patient also found to have recurrent skin infection of left great toe.  CHIEF COMPLAINT:   Chief Complaint  Patient presents with  . Recurrent Skin Infections  Today she feels much better, she slept through the night, she told me that she did not sleep for last 3 days and had a good night sleep for the first time last night.  REVIEW OF SYSTEMS:   ROS CONSTITUTIONAL: No fever, fatigue or weakness.  EYES: No blurred or double vision.  EARS, NOSE, AND THROAT: No tinnitus or ear pain.  RESPIRATORY: Shortness of breath improved. CARDIOVASCULAR: No chest pain, orthopnea, edema.  GASTROINTESTINAL: No nausea, vomiting, diarrhea or abdominal pain.  GENITOURINARY: No dysuria, hematuria.  ENDOCRINE: No polyuria, nocturia,  HEMATOLOGY: No anemia, easy bruising or bleeding SKIN: No rash or lesion. MUSCULOSKELETAL: No joint pain or arthritis.   NEUROLOGIC: No tingling, numbness, weakness.  PSYCHIATRY: No anxiety or depression.   DRUG ALLERGIES:   Allergies  Allergen Reactions  . Ceftriaxone Swelling  . Cefuroxime Hives  . Cephalosporins Swelling and Other (See Comments)    Reaction:  Fainting/dry mouth   . Codeine Nausea And Vomiting  . Epinephrine Other (See Comments)    Reaction:  Fainting   . Morphine And Related Other (See Comments)    Reaction:  GI upset   . Buprenorphine Hcl Nausea And Vomiting  . Ciprofloxacin Itching, Swelling and Rash  . Latex Rash  . Lidocaine Rash  . Procaine Rash    VITALS:  Blood pressure 139/65, pulse 75, temperature 98.1 F (36.7 C), temperature source Oral, resp. rate 19, height 5\' 7"  (1.702 m), weight 79.4 kg, SpO2 96 %.  PHYSICAL EXAMINATION:  GENERAL:   84 y.o.-year-old patient lying in the bed with no acute distress.  EYES: Pupils equal, round, reactive to light and accommodation. No scleral icterus. Extraocular muscles intact.  HEENT: Head atraumatic, normocephalic. Oropharynx and nasopharynx clear.  NECK:  Supple, no jugular venous distention. No thyroid enlargement, no tenderness.  LUNGS: Mostly clear to auscultation, no wheeze, no rales. CARDIOVASCULAR: S1, S2 normal. No murmurs, rubs, or gallops.  ABDOMEN: Soft, nontender, nondistended. Bowel sounds present. No organomegaly or mass.  EXTREMITIES: No pedal edema, cyanosis, or clubbing.  NEUROLOGIC: Cranial nerves II through XII are intact. Muscle strength 5/5 in all extremities. Sensation intact. Gait not checked.  PSYCHIATRIC: The patient is alert and oriented x 3.  SKIN: Left great toe has dry callus on plantar surface., flaky skin, no obvious drainage.  LABORATORY PANEL:   CBC Recent Labs  Lab 06/15/18 0329  WBC 7.3  HGB 9.8*  HCT 33.0*  PLT 193   ------------------------------------------------------------------------------------------------------------------  Chemistries  Recent Labs  Lab 06/15/18 0329  NA 141  K 4.2  CL 102  CO2 29  GLUCOSE 163*  BUN 27*  CREATININE 0.99  CALCIUM 8.8*   ------------------------------------------------------------------------------------------------------------------  Cardiac Enzymes Recent Labs  Lab 06/14/18 1106  TROPONINI <0.03   ------------------------------------------------------------------------------------------------------------------  RADIOLOGY:  Dg Chest 1 View  Result Date: 06/14/2018 CLINICAL DATA:  Difficulty breathing EXAM: CHEST  1 VIEW COMPARISON:  06/01/2018 FINDINGS: Stable cardiomegaly with vascular congestion and mild interstitial prominence with basilar Kerley B-lines suggesting early edema. No large effusion or  pneumothorax. No definite focal pneumonia, collapse or consolidation. Left subclavian  2 lead pacer noted. Aorta atherosclerotic. Degenerative changes of the spine and partial imaging of the thoracolumbar fusion hardware. Apical scarring present. IMPRESSION: Cardiomegaly with vascular congestion and mild basilar interstitial edema pattern. Electronically Signed   By: Jerilynn Mages.  Shick M.D.   On: 06/14/2018 11:53    EKG:   Orders placed or performed during the hospital encounter of 06/14/18  . EKG 12-Lead  . EKG 12-Lead  . ED EKG  . ED EKG  . EKG 12-Lead  . EKG 12-Lead    ASSESSMENT AND PLAN:  84 year old female with multiple medical problems of COPD, chronic diastolic heart failure, diabetes mellitus type 2 came in because of shortness of breath.   #1 acute on chronic diastolic heart failure; patient has shortness of breath improved on IV Lasix,  change to p.o. Lasix tomorrow, patient is off oxygen and clinically she is doing much better and wants to go home tomorrow. 2.  Chronic left great toe infection, seen by wound care, wound looks dry, continue dressing changes, discontinue IV antibiotics I do not think she needs any further IV antibiotics. #3 diabetes mellitus type 2: Overall controlled, continue home dose oral  diabetic medicines, insulin sliding scale with coverage, #4 chronic A. fib, rate controlled, continue Xarelto, Coreg. Possible discharge home tomorrow with home health for dressing changes for left great toe.   more than50% time spent in counseling, coordination of care  All the records are reviewed and case discussed with Care Management/Social Workerr. Management plans discussed with the patient, family and they are in agreement.  CODE STATUS: DNR  TOTAL TIME TAKING CARE OF THIS PATIENT:40 minutes.   POSSIBLE D/C IN 1 DAY, DEPENDING ON CLINICAL CONDITION.   Epifanio Lesches M.D on 06/15/2018 at 12:51 PM  Between 7am to 6pm - Pager - [phone]  After 6pm go to [email or web address] - password [email or web address] [facility]  Office   [phone]  CC: Primary care physician; Tracie Harrier, MD   Note: This dictation was prepared with Dragon dictation along with smaller phrase technology. Any transcriptional errors that result from this process are unintentional.

## 2018-06-15 NOTE — Consult Note (Signed)
[facility] Nurse wound consult note Patient receiving care in [facility].  No family present. Reason for Consult: Left foot "wounds" Wound type: The left great toe and the plantar surface of the left foot are both dry, stable calluses; there are no open wounds Wound bed: Dry callus Drainage (amount, consistency, odor) No drainage, no odor, no induration Periwound: Dry, flaking skin Dressing procedure/placement/frequency: For the plantar surface maintain the formed padding surrounding the callus on the plantar surface of the left foot.  Do NOT removed.  You should take down the kerlex each day and look at the callus, and if the callus remains dry, stable, and the padding is not soiled with drainage, rewrap the foot with kerlex. For the great toe, maintain the fenestrated silicone dressing.  If it becomes soiled or dislodged, place a small piece of Mepitel Kellie Simmering # [phone]) to the area.  This can remain in place for up to a week. Monitor the wound area(s) for worsening of condition such as: Signs/symptoms of infection,  Increase in size,  Development of or worsening of odor, Development of pain, or increased pain at the affected locations.  Notify the medical team if any of these develop.  Thank you for the consult.  Discussed plan of care with the patient and bedside nurse.  [facility] nurse will not follow at this time.  Please re-consult the [facility] team if needed.  Val Riles, RN, MSN, CWOCN, CNS-BC, pager [phone]

## 2018-06-16 LAB — GLUCOSE, CAPILLARY
Glucose-Capillary: 180 mg/dL — ABNORMAL HIGH (ref 70–99)
Glucose-Capillary: 192 mg/dL — ABNORMAL HIGH (ref 70–99)

## 2018-06-16 MED ORDER — OXYCODONE HCL 5 MG PO CAPS: 5.0000 mg | ORAL_CAPSULE | Freq: Four times a day (QID) | ORAL | 0 refills | Status: DC | PRN

## 2018-06-16 MED ORDER — FUROSEMIDE 40 MG PO TABS: 40.0000 mg | ORAL_TABLET | Freq: Two times a day (BID) | ORAL | 0 refills | Status: DC

## 2018-06-16 NOTE — Care Management Obs Status (Signed)
[facility] NOTIFICATION   Patient Details  [name] MRN: [id number] Date of Birth: December 27, 1924   Medicare Observation Status Notification Given:  Yes    Elza Rafter, RN 06/16/2018, 9:14 AM

## 2018-06-16 NOTE — Plan of Care (Signed)
  Problem: Elimination: Goal: Will not experience complications related to urinary retention Outcome: Progressing Note:  Receiving IV lasix, has external catheter in place, good output   Problem: Pain Managment: Goal: General experience of comfort will improve Outcome: Progressing Note:  Received PRN oxycodone once for some chronic back pain, which gave relief   Problem: Safety: Goal: Ability to remain free from injury will improve Outcome: Progressing   Problem: Skin Integrity: Goal: Risk for impaired skin integrity will decrease Outcome: Progressing

## 2018-06-16 NOTE — Care Management Note (Signed)
Case Management Note  Patient Details  [name] MRN: [id number] Date of Birth: [birth year]  Subjective/Objective:      Patient is from home with husband.  Placed in obs with artrial fibrillation.   Patient has private caregivers at home for several hours everyday.  Denies difficulties obtaining medications or with accessing medical care.  One of her caregivers will transport to home today.  She is open to [facility] currently for OT, PT. Notified [facility] of patient discharge today.  She is current with her PCP, uses a walker at home and 2L O2 at night.  No further needs identified at this time by [facility].            Action/Plan:   Expected Discharge Date:  06/16/18               Expected Discharge Plan:  [place]  In-House Referral:     Discharge planning Services  CM Consult  Post Acute Care Choice:  Home Health Choice offered to:  Patient  DME Arranged:    DME Agency:     HH Arranged:  RN, OT, PT, Nurse's Aide [place] Agency:     Status of Service:  Completed, signed off  If discussed at [place] of Stay Meetings, dates discussed:    Additional Comments:  Elza Rafter, RN 06/16/2018, 9:27 AM

## 2018-06-16 NOTE — Progress Notes (Signed)
Discharge instructions explained to pt/ verbalized an understanding/ iv and tele removed/ RX given to pt/ transported off unit via wheelchair.  

## 2018-06-16 NOTE — Evaluation (Signed)
Physical Therapy Evaluation Patient Details [name] MRN: [id number] DOB: [birth year] Today's Date: 06/16/2018   History of Present Illness  84 year old female admitted from home with CHF exacerbation, diuresed and referred to PT for assessment of mobility.  pt has ortho shoes for her infection on L foot.  Has cardiomegaly, interstitial edema on chest xray, continued issues of LLE cellulitis.  Pseudomonas infection with SOB.  PMH to include CHF, Afib, Fall, DM ,HTN, HLD, CHF, COPD.   Clinical Impression  Pt was able to stand with max assist but is dropping O2 sats on room air to 87% briefly.  Her husband and his caregiver are in attendance for part of session and note her weakness that existed last time in hosp.  Pt is recommended to SNF due to the more dependent nature of mobility and is not currently able to ambulate functionally.  Her plan is to refuse SNF and go home to use the caregivers who are there already.  Have counseled pt in the reasons for the PT decision but she declines it.    Follow Up Recommendations SNF    Equipment Recommendations  None recommended by PT    Recommendations for Other Services       Precautions / Restrictions Precautions Precautions: Fall Precaution Comments: monitor O2 sats Required Braces or Orthoses: Other Brace(ortho shoes for standing and gait) Restrictions Weight Bearing Restrictions: Yes LLE Weight Bearing: Weight bearing as tolerated Other Position/Activity Restrictions: post op shoe on left LE due to existing wound      Mobility  Bed Mobility Overal bed mobility: Needs Assistance Bed Mobility: Supine to Sit     Supine to sit: Min assist     General bed mobility comments: bed rails and trunk support but lists backward often  Transfers Overall transfer level: Needs assistance Equipment used: Rolling walker (2 wheeled) Transfers: Sit to/from Stand Sit to Stand: Max assist         General transfer comment: cued  verbally to change technique but pt automatically uses walker   Ambulation/Gait Ambulation/Gait assistance: Min assist Gait Distance (Feet): 3 Feet Assistive device: Rolling walker (2 wheeled) Gait Pattern/deviations: Step-to pattern;Shuffle;Wide base of support Gait velocity: reduced Gait velocity interpretation: <1.8 ft/sec, indicate of risk for recurrent falls General Gait Details: side of bed as pt sits with poor control of descent from losing her balance  Stairs            Wheelchair Mobility    Modified Rankin (Stroke Patients Only)       Balance Overall balance assessment: History of Falls;Needs assistance Sitting-balance support: Feet supported;Bilateral upper extremity supported Sitting balance-Leahy Scale: Poor       Standing balance-Leahy Scale: Poor                               Pertinent Vitals/Pain Pain Assessment: Faces Faces Pain Scale: Hurts a little bit Pain Location: LLE from cellulitis Pain Intervention(s): Monitored during session;Repositioned    Home Living Family/patient expects to be discharged to:: Private residence Living Arrangements: Spouse/significant other Available Help at Discharge: Family;Available PRN/intermittently;Personal care attendant Type of Home: House Home Access: Ramped entrance     Home Layout: One level;Other (Comment)(has one step to get in part of house) Home Equipment: Gilford Rile - 2 wheels;Shower seat;Wheelchair - manual      Prior Function Level of Independence: Needs assistance   Gait / Transfers Assistance Needed: has RW for household  walking but with falls  ADL's / Homemaking Assistance Needed: has caretakers assist with ADLs        Hand Dominance   Dominant Hand: Right    Extremity/Trunk Assessment   Upper Extremity Assessment Upper Extremity Assessment: Generalized weakness    Lower Extremity Assessment Lower Extremity Assessment: Generalized weakness    Cervical / Trunk  Assessment Cervical / Trunk Assessment: Kyphotic  Communication   Communication: HOH  Cognition Arousal/Alertness: Awake/alert Behavior During Therapy: WFL for tasks assessed/performed Overall Cognitive Status: No family/caregiver present to determine baseline cognitive functioning                                 General Comments: pt is not logical about her fall risk      General Comments General comments (skin integrity, edema, etc.): pt is unable to control standing and continued to insist she just needed to walk away from bed.  Talked with her about use of bed for controlling balance and then she shared she needed to go home to be with her husband who is somewhat cared for by her    Exercises     Assessment/Plan    PT Assessment Patient needs continued PT services  PT Problem List Decreased range of motion;Decreased activity tolerance;Decreased balance;Decreased mobility;Decreased coordination;Decreased knowledge of use of DME;Decreased safety awareness;Cardiopulmonary status limiting activity;Decreased skin integrity       PT Treatment Interventions DME instruction;Gait training;Functional mobility training;Therapeutic activities;Therapeutic exercise;Balance training;Neuromuscular re-education;Patient/family education    PT Goals (Current goals can be found in the Care Plan section)  Acute Rehab PT Goals Patient Stated Goal: to get back home PT Goal Formulation: With patient Time For Goal Achievement: 06/30/18 Potential to Achieve Goals: Fair    Frequency Min 2X/week   Barriers to discharge Decreased caregiver support has one caregiver for both she and her husband    Co-evaluation               AM-PAC PT "6 Clicks" Mobility  Outcome Measure Help needed turning from your back to your side while in a flat bed without using bedrails?: A Little Help needed moving from lying on your back to sitting on the side of a flat bed without using bedrails?: A  Little Help needed moving to and from a bed to a chair (including a wheelchair)?: A Lot Help needed standing up from a chair using your arms (e.g., wheelchair or bedside chair)?: A Lot Help needed to walk in hospital room?: Total Help needed climbing 3-5 steps with a railing? : Total 6 Click Score: 12    End of Session Equipment Utilized During Treatment: Gait belt;Oxygen Activity Tolerance: Treatment limited secondary to medical complications (Comment) Patient left: in bed;with call bell/phone within reach;with bed alarm set Nurse Communication: Mobility status PT Visit Diagnosis: Unsteadiness on feet (R26.81);History of falling (Z91.81);Adult, failure to thrive (R62.7)    Time: 1610-9604 PT Time Calculation (min) (ACUTE ONLY): 29 min   Charges:   PT Evaluation $PT Eval Moderate Complexity: 1 Mod PT Treatments $Therapeutic Activity: 8-22 mins       Ramond Dial 06/16/2018, 12:39 PM   Mee Hives, PT MS Acute Rehab Dept. Number: [place] [facility] [place]

## 2018-06-16 NOTE — Progress Notes (Signed)
Patient is stable for discharge, continue Lasix 40 mg p.o. twice daily for next 2 days and then resume home dose.  Discharge home with home health.  Discharge home PT, OT, nurse.

## 2018-06-20 NOTE — Discharge Summary (Signed)
[name], is a 84 y.o. female  DOB 1924/08/28  MRN [id number].  Admission date:  06/14/2018  Admitting Physician  Epifanio Lesches, MD  Discharge Date:  06/20/2018   Primary MD  Tracie Harrier, MD  Recommendations for primary care physician for things to follow:  Follow-up with PCP in 1 week   Admission Diagnosis  Cellulitis of lower extremity, unspecified laterality [Z22.482] Acute on chronic congestive heart failure, unspecified heart failure type ([place]) [I50.9]   Discharge Diagnosis  Cellulitis of lower extremity, unspecified laterality [L03.119] Acute on chronic congestive heart failure, unspecified heart failure type ([place]) [I50.9]    Active Problems:   CHF exacerbation ([place])      Past Medical History:  Diagnosis Date  . Cancer ([place])    skin ca  . CHF (congestive heart failure) ([place])   . Chronic back pain   . COPD (chronic obstructive pulmonary disease) ([place])   . Diabetes mellitus without complication ([place])   . Hypertension   . Poor perfusion of leg     Past Surgical History:  Procedure Laterality Date  . ABDOMINAL HYSTERECTOMY    . BACK SURGERY     x3  . CORONARY STENT PLACEMENT     unknown location per pt  . FOOT SURGERY    . LEG SURGERY    . PACEMAKER PLACEMENT    . TONSILLECTOMY         History of present illness and  Hospital Course:     Kindly see H&P for history of present illness and admission details, please review complete Labs, Consult reports and Test reports for all details in brief  HPI  from the history and physical done on the day of admission 84 year old female patient admitted for shortness of breath, worsening pedal edema.  Just discharged from hospital for left great toe cellulitis.  Admitted for CHF exacerbation.   Hospital Course  Acute on chronic  diastolic heart failure, admitted to telemetry, started on IV Lasix, next day patient felt much better and decreased leg edema, orthopnea, PND.  Discharge home with Lasix 40 mg p.o. twice daily for the next 2 days and then resume home dose of Lasix after that.  Patient understand, discharge home with home health PT, nursing. 2.  Recent left leg cellulitis, that time she received IV antibiotics and had a follow-up with Dr. Elvina Mattes, discharge home with home health nurse.  This time patient did not have any infection, seen by wound care nurse, discharge home with home health nursing, patient advised to continue postop shoe, not to put weight on that leg. 3.  Chronic lymphedema of the legs 4.  Diabetes mellitus type 2: Controlled, patient is on home dose metformin, Januvia, Amaryl. 5.  Chronic A. fib, patient is on Coreg, Xarelto.    Discharge Condition: Stable   Follow UP  Follow-up Information    [place] On 06/29/2018.   Specialty:  Cardiology Why:  at 3:00pm Contact information: [place] [phone]       Mortimer Fries, [place]. Schedule an appointment as soon as possible for a visit on 06/22/2018.   Specialty:  Physician Assistant Why:  Appointment Time: @ 10:00 Contact information: [place] [address] [phone]             Discharge Instructions  and  Discharge Medications     Allergies as of 06/16/2018  Reactions   Ceftriaxone Swelling   Cefuroxime Hives   Cephalosporins Swelling, Other (See Comments)   Reaction:  Fainting/dry mouth    Codeine Nausea And Vomiting   Epinephrine Other (See Comments)   Reaction:  Fainting    Morphine And Related Other (See Comments)   Reaction:  GI upset    Buprenorphine Hcl Nausea And Vomiting   Ciprofloxacin Itching, Swelling, Rash   Latex Rash   Lidocaine Rash    Procaine Rash      Medication List    TAKE these medications   acetaminophen 325 MG tablet Commonly known as:  TYLENOL Take 2 tablets (650 mg total) by mouth every 6 (six) hours as needed for mild pain (or Fever >/= 101).   aspirin EC 81 MG tablet Take 81 mg by mouth daily.   baclofen 10 MG tablet Commonly known as:  LIORESAL Take 10 mg by mouth 3 (three) times daily as needed.   buPROPion 150 MG 24 hr tablet Commonly known as:  WELLBUTRIN XL Take 1 tablet by mouth daily.   carvedilol 6.25 MG tablet Commonly known as:  COREG Take 6.25 mg by mouth 2 (two) times daily with a meal.   cholecalciferol 1000 units tablet Commonly known as:  VITAMIN D Take 1,000 Units by mouth daily.   docusate sodium 100 MG capsule Commonly known as:  COLACE Take 100 mg by mouth 2 (two) times daily.   escitalopram 10 MG tablet Commonly known as:  LEXAPRO Take 10 mg by mouth daily.   ferrous sulfate 325 (65 FE) MG tablet Take 325 mg by mouth daily with breakfast.   furosemide 40 MG tablet Commonly known as:  LASIX Take 1 tablet (40 mg total) by mouth 2 (two) times daily for 10 days. Take lasix  40 mg p.o. twice daily for 2 days then can resume home dose.  Patient told me that she takes Lasix 80 mg alternating with 40 mg. What changed:    how much to take  when to take this  additional instructions   gabapentin 100 MG capsule Commonly known as:  NEURONTIN Take 100-200 mg by mouth at bedtime.   glimepiride 4 MG tablet Commonly known as:  AMARYL Take 4 mg by mouth 2 (two) times daily.   Insulin Pen Needle 31G X 5 MM Misc 4 Syringes by Does not apply route 4 (four) times daily -  before meals and at bedtime. Use 3 times daily with meals and at bedtime as needed   JANUVIA 100 MG tablet Generic drug:  sitaGLIPtin Take 100 mg by mouth daily.   magnesium oxide 400 MG tablet Commonly known as:  MAG-OX Take 400 mg by mouth daily.   Melatonin 1 MG Tabs Take 1 mg by mouth at  bedtime.   metFORMIN 500 MG tablet Commonly known as:  GLUCOPHAGE Take 1 tablet by mouth daily.   multivitamin tablet Take 1 tablet by mouth daily.   oxycodone 5 MG capsule Commonly known as:  OXY-IR Take 1-2 capsules (5-10 mg total) by mouth every 6 (six) hours as needed (MOD SEVERE pain).   pantoprazole 40 MG tablet Commonly known as:  PROTONIX Take 40 mg by mouth daily.   PROAIR HFA 108 (90 Base) MCG/ACT inhaler Generic drug:  albuterol Inhale 2 puffs into the lungs every 6 (six) hours as needed for wheezing or shortness of breath.   Rivaroxaban 15 MG Tabs tablet Commonly known as:  XARELTO Take 15 mg by mouth daily.   rOPINIRole  0.5 MG tablet Commonly known as:  REQUIP Take 0.5 mg by mouth 2 (two) times daily.   spironolactone 25 MG tablet Commonly known as:  ALDACTONE Take 0.5 tablets (12.5 mg total) by mouth daily.   vitamin B-12 1000 MCG tablet Commonly known as:  CYANOCOBALAMIN Take 1,000 mcg by mouth daily.   vitamin C 500 MG tablet Commonly known as:  ASCORBIC ACID Take 500 mg by mouth daily.   Vitamin D (Ergocalciferol) 1.25 MG (50000 UT) Caps capsule Commonly known as:  DRISDOL Take 50,000 Units by mouth every 7 (seven) days. Pt takes on Thursday.         Diet and Activity recommendation: See Discharge Instructions above   Consults obtained -physical therapy, wound care nurse  major procedures and Radiology Reports - PLEASE review detailed and final reports for all details, in brief -     Dg Chest 1 View  Result Date: 06/14/2018 CLINICAL DATA:  Difficulty breathing EXAM: CHEST  1 VIEW COMPARISON:  06/01/2018 FINDINGS: Stable cardiomegaly with vascular congestion and mild interstitial prominence with basilar Kerley B-lines suggesting early edema. No large effusion or pneumothorax. No definite focal pneumonia, collapse or consolidation. Left subclavian 2 lead pacer noted. Aorta atherosclerotic. Degenerative changes of the spine and partial  imaging of the thoracolumbar fusion hardware. Apical scarring present. IMPRESSION: Cardiomegaly with vascular congestion and mild basilar interstitial edema pattern. Electronically Signed   By: Jerilynn Mages.  Shick M.D.   On: 06/14/2018 11:53   Dg Chest 2 View  Result Date: 06/01/2018 CLINICAL DATA:  Shortness of breath EXAM: CHEST - 2 VIEW COMPARISON:  05/16/2018, 2020-04-1118 FINDINGS: Left-sided pacing device as before. Mild cardiomegaly with aortic atherosclerosis. No acute airspace disease. No pleural effusion. Hyperinflation. Surgical hardware within the thoracolumbar spine. No pneumothorax. IMPRESSION: No active cardiopulmonary disease.  Mild cardiomegaly. Electronically Signed   By: Donavan Foil M.D.   On: 06/01/2018 17:06   Ct Angio Chest Pe W And/or Wo Contrast  Result Date: 06/01/2018 CLINICAL DATA:  Acute shortness of breath. Bilateral leg swelling. EXAM: CT ANGIOGRAPHY CHEST WITH CONTRAST TECHNIQUE: Multidetector CT imaging of the chest was performed using the standard protocol during bolus administration of intravenous contrast. Multiplanar CT image reconstructions and MIPs were obtained to evaluate the vascular anatomy. CONTRAST:  71mL OMNIPAQUE IOHEXOL 350 MG/ML SOLN COMPARISON:  Chest radiographs obtained earlier today. Chest CT dated 03/15/2016. FINDINGS: Cardiovascular: Normally opacified pulmonary arteries with no pulmonary arterial filling defects seen. Atheromatous calcifications, including the coronary arteries and aorta. Enlarged heart, most pronounced involving the right atrium followed by the left atrium. Mediastinum/Nodes: Enlarged subcarinal node with a short axis diameter of 23 mm on image number 132 series 6. Mildly enlarged right hilar nodes, including a node with a short axis diameter of 10 mm on image number 140 series 6. Multiple enlarged superior mediastinal nodes, including a right anterior paratracheal node with a short axis diameter of 19 mm on image number 94 series 6. Unremarkable  thyroid gland and esophagus. Lungs/Pleura: 8 x 3 mm subpleural nodule in the left lower lobe on image number 53 series 5, unchanged. 8 mm right lower lobe nodule on image number 73 series 5, unchanged. A previously demonstrated 7 mm right lower lobe nodule is no longer visualized. A previously demonstrated smaller right lower lobe nodule is also no longer visualized. Mild patchy ground-glass opacity scattered in both lungs with mild progression. Mild increase in prominence of the interstitial markings in both lower lung zones with septal thickening. No pleural fluid. Upper  Abdomen: Reflux of contrast into the hepatic veins and inferior vena cava. Artifacts produced by lumbar spine fixation hardware. Musculoskeletal: Stable thoracolumbar spine fixation hardware and T10 vertebral body old compression deformity with mild bony retropulsion. Thoracic and lower cervical spine degenerative changes. Review of the MIP images confirms the above findings. IMPRESSION: 1. No pulmonary emboli. 2. Mild changes of congestive heart failure with right heart failure. 3. Mildly progressive mediastinal and right hilar adenopathy, most likely reactive. 4. Stable bilateral lower lobe pulmonary nodules, previously stable since 2016. The long-term stability is compatible with a benign process. 5.  Calcific coronary artery and aortic atherosclerosis. Aortic Atherosclerosis (ICD10-I70.0). Electronically Signed   By: Claudie Revering M.D.   On: 06/01/2018 22:40   US Venous Img Lower Unilateral Left  Result Date: 06/01/2018 CLINICAL DATA:  Left lower extremity pain and edema EXAM: Left LOWER EXTREMITY VENOUS DOPPLER ULTRASOUND TECHNIQUE: Gray-scale sonography with graded compression, as well as color Doppler and duplex ultrasound were performed to evaluate the lower extremity deep venous systems from the level of the common femoral vein and including the common femoral, femoral, profunda femoral, popliteal and calf veins including the posterior  tibial, peroneal and gastrocnemius veins when visible. The superficial great saphenous vein was also interrogated. Spectral Doppler was utilized to evaluate flow at rest and with distal augmentation maneuvers in the common femoral, femoral and popliteal veins. COMPARISON:  04/14/2017 FINDINGS: Contralateral Common Femoral Vein: Respiratory phasicity is normal and symmetric with the symptomatic side. No evidence of thrombus. Normal compressibility. Common Femoral Vein: No evidence of thrombus. Normal compressibility, respiratory phasicity and response to augmentation. Saphenofemoral Junction: No evidence of thrombus. Normal compressibility and flow on color Doppler imaging. Profunda Femoral Vein: No evidence of thrombus. Normal compressibility and flow on color Doppler imaging. Femoral Vein: No evidence of thrombus. Normal compressibility, respiratory phasicity and response to augmentation. Popliteal Vein: No evidence of thrombus. Normal compressibility, respiratory phasicity and response to augmentation. Calf Veins: Poorly visualized. Other Findings:  None. IMPRESSION: No evidence of deep venous thrombosis. Electronically Signed   By: Donavan Foil M.D.   On: 06/01/2018 20:51   Dg Knee Complete 4 Views Right  Result Date: 06/01/2018 CLINICAL DATA:  Pain, post fall swelling and bruising EXAM: RIGHT KNEE - COMPLETE 4+ VIEW COMPARISON:  07/15/2017 FINDINGS: No fracture or malalignment. Moderate degenerative change medial compartment. No significant knee effusion. Vascular calcifications. IMPRESSION: No acute osseous abnormality.  Moderate arthritis. Electronically Signed   By: Donavan Foil M.D.   On: 06/01/2018 20:03    Micro Results     No results found for this or any previous visit (from the past 240 hour(s)).     Today   Subjective:   [name] today has no headache,no chest abdominal pain,no new weakness tingling or numbness, feels much better wants to go home today.   Objective:   Blood  pressure (!) 142/65, pulse 72, temperature 98.5 F (36.9 C), temperature source Oral, resp. rate 15, height 5\' 7"  (1.702 m), weight 76.7 kg, SpO2 91 %.  No intake or output data in the 24 hours ending 06/20/18 2009  Exam Awake Alert, Oriented x 3, No new F.N deficits, Normal affect [place].AT,PERRAL Supple Neck,No JVD, No cervical lymphadenopathy appriciated.  Symmetrical Chest wall movement, Good air movement bilaterally, CTAB RRR,No Gallops,Rubs or new Murmurs, No Parasternal Heave +ve B.Sounds, Abd Soft, Non tender, No organomegaly appriciated, No rebound -guarding or rigidity. Decreased leg edema, chronic left great toe cellulitis, seen by wound care nurse, wound  looks dry.  Home health nurse to do dressing changes.  Data Review   CBC w Diff:  Lab Results  Component Value Date   WBC 7.3 06/15/2018   HGB 9.8 (L) 06/15/2018   HGB 11.2 (L) 10/21/2014   HCT 33.0 (L) 06/15/2018   HCT 34.5 (L) 10/21/2014   PLT 193 06/15/2018   PLT 156 10/21/2014   LYMPHOPCT 16 06/14/2018   LYMPHOPCT 13.2 10/21/2014   MONOPCT 10 06/14/2018   MONOPCT 11.1 10/21/2014   EOSPCT 6 06/14/2018   EOSPCT 3.1 10/21/2014   BASOPCT 1 06/14/2018   BASOPCT 1.7 10/21/2014    CMP:  Lab Results  Component Value Date   NA 141 06/15/2018   NA 137 10/21/2014   K 4.2 06/15/2018   K 4.4 10/21/2014   CL 102 06/15/2018   CL 97 (L) 10/21/2014   CO2 29 06/15/2018   CO2 31 10/21/2014   BUN 27 (H) 06/15/2018   BUN 36 (H) 10/21/2014   CREATININE 0.99 06/15/2018   CREATININE 1.02 (H) 10/21/2014   PROT 7.3 05/16/2018   PROT 7.1 10/21/2014   ALBUMIN 4.1 05/16/2018   ALBUMIN 4.0 10/21/2014   BILITOT 0.7 05/16/2018   BILITOT 0.8 10/21/2014   ALKPHOS 80 05/16/2018   ALKPHOS 91 10/21/2014   AST 41 05/16/2018   AST 17 10/21/2014   ALT 30 05/16/2018   ALT 11 (L) 10/21/2014  .   Total Time in preparing paper work, data evaluation and todays exam - 35 minutes  Epifanio Lesches M.D on 06/16/2018 at 8:09  PM    Note: This dictation was prepared with Dragon dictation along with smaller phrase technology. Any transcriptional errors that result from this process are unintentional.

## 2018-06-29 ENCOUNTER — Ambulatory Visit: Payer: Medicare Other | Admitting: Family

## 2018-06-29 ENCOUNTER — Encounter: Payer: Self-pay | Admitting: Emergency Medicine

## 2018-06-29 ENCOUNTER — Emergency Department: Payer: Medicare Other

## 2018-06-29 ENCOUNTER — Inpatient Hospital Stay
Admission: EM | Admit: 2018-06-29 | Discharge: 2018-07-01 | DRG: 194 | Disposition: A | Discharge status: Home Health | Payer: Medicare Other | Attending: Internal Medicine | Admitting: Internal Medicine

## 2018-06-29 ENCOUNTER — Other Ambulatory Visit: Payer: Self-pay

## 2018-06-29 DIAGNOSIS — Z7901 Long term (current) use of anticoagulants: Secondary | ICD-10-CM

## 2018-06-29 DIAGNOSIS — Z888 Allergy status to other drugs, medicaments and biological substances status: Secondary | ICD-10-CM | POA: Diagnosis not present

## 2018-06-29 DIAGNOSIS — Z7982 Long term (current) use of aspirin: Secondary | ICD-10-CM

## 2018-06-29 DIAGNOSIS — I482 Chronic atrial fibrillation, unspecified: Secondary | ICD-10-CM | POA: Diagnosis present

## 2018-06-29 DIAGNOSIS — J440 Chronic obstructive pulmonary disease with acute lower respiratory infection: Secondary | ICD-10-CM | POA: Diagnosis present

## 2018-06-29 DIAGNOSIS — Z66 Do not resuscitate: Secondary | ICD-10-CM | POA: Diagnosis present

## 2018-06-29 DIAGNOSIS — Z950 Presence of cardiac pacemaker: Secondary | ICD-10-CM | POA: Diagnosis not present

## 2018-06-29 DIAGNOSIS — Z881 Allergy status to other antibiotic agents status: Secondary | ICD-10-CM

## 2018-06-29 DIAGNOSIS — J9611 Chronic respiratory failure with hypoxia: Secondary | ICD-10-CM | POA: Diagnosis present

## 2018-06-29 DIAGNOSIS — I110 Hypertensive heart disease with heart failure: Secondary | ICD-10-CM | POA: Diagnosis present

## 2018-06-29 DIAGNOSIS — Z885 Allergy status to narcotic agent status: Secondary | ICD-10-CM

## 2018-06-29 DIAGNOSIS — R112 Nausea with vomiting, unspecified: Secondary | ICD-10-CM | POA: Diagnosis present

## 2018-06-29 DIAGNOSIS — G894 Chronic pain syndrome: Secondary | ICD-10-CM | POA: Diagnosis present

## 2018-06-29 DIAGNOSIS — Z91040 Latex allergy status: Secondary | ICD-10-CM | POA: Diagnosis not present

## 2018-06-29 DIAGNOSIS — I5032 Chronic diastolic (congestive) heart failure: Secondary | ICD-10-CM | POA: Diagnosis present

## 2018-06-29 DIAGNOSIS — Z823 Family history of stroke: Secondary | ICD-10-CM

## 2018-06-29 DIAGNOSIS — I2510 Atherosclerotic heart disease of native coronary artery without angina pectoris: Secondary | ICD-10-CM | POA: Diagnosis present

## 2018-06-29 DIAGNOSIS — E1165 Type 2 diabetes mellitus with hyperglycemia: Secondary | ICD-10-CM | POA: Diagnosis present

## 2018-06-29 DIAGNOSIS — Z8249 Family history of ischemic heart disease and other diseases of the circulatory system: Secondary | ICD-10-CM

## 2018-06-29 DIAGNOSIS — Y95 Nosocomial condition: Secondary | ICD-10-CM | POA: Diagnosis present

## 2018-06-29 DIAGNOSIS — J189 Pneumonia, unspecified organism: Principal | ICD-10-CM | POA: Diagnosis present

## 2018-06-29 DIAGNOSIS — Z833 Family history of diabetes mellitus: Secondary | ICD-10-CM | POA: Diagnosis not present

## 2018-06-29 DIAGNOSIS — Z955 Presence of coronary angioplasty implant and graft: Secondary | ICD-10-CM

## 2018-06-29 DIAGNOSIS — Z90710 Acquired absence of both cervix and uterus: Secondary | ICD-10-CM | POA: Diagnosis not present

## 2018-06-29 DIAGNOSIS — Z794 Long term (current) use of insulin: Secondary | ICD-10-CM | POA: Diagnosis not present

## 2018-06-29 DIAGNOSIS — Z79899 Other long term (current) drug therapy: Secondary | ICD-10-CM

## 2018-06-29 DIAGNOSIS — J9601 Acute respiratory failure with hypoxia: Secondary | ICD-10-CM

## 2018-06-29 LAB — COMPREHENSIVE METABOLIC PANEL
ALT: 11 U/L (ref 0–44)
AST: 16 U/L (ref 15–41)
Albumin: 3.6 g/dL (ref 3.5–5.0)
Alkaline Phosphatase: 111 U/L (ref 38–126)
Anion gap: 10 (ref 5–15)
BUN: 41 mg/dL — ABNORMAL HIGH (ref 8–23)
CO2: 29 mmol/L (ref 22–32)
Calcium: 8.9 mg/dL (ref 8.9–10.3)
Chloride: 98 mmol/L (ref 98–111)
Creatinine, Ser: 1.22 mg/dL — ABNORMAL HIGH (ref 0.44–1.00)
GFR calc Af Amer: 45 mL/min — ABNORMAL LOW (ref >60)
GFR calc non Af Amer: 39 mL/min — ABNORMAL LOW (ref >60)
Glucose, Bld: 281 mg/dL — ABNORMAL HIGH (ref 70–99)
Potassium: 3.7 mmol/L (ref 3.5–5.1)
Sodium: 137 mmol/L (ref 135–145)
Total Bilirubin: 0.8 mg/dL (ref 0.3–1.2)
Total Protein: 7 g/dL (ref 6.5–8.1)

## 2018-06-29 LAB — CBC WITH DIFFERENTIAL/PLATELET
Abs Immature Granulocytes: 0.05 10*3/uL (ref 0.00–0.07)
Basophils Absolute: 0.1 10*3/uL (ref 0.0–0.1)
Basophils Relative: 1 %
Eosinophils Absolute: 0.2 10*3/uL (ref 0.0–0.5)
Eosinophils Relative: 2 %
HEMATOCRIT: 33.2 % — AB (ref 36.0–46.0)
HEMOGLOBIN: 10.2 g/dL — AB (ref 12.0–15.0)
Immature Granulocytes: 0 %
Lymphocytes Relative: 8 %
Lymphs Abs: 1 10*3/uL (ref 0.7–4.0)
MCH: 29 pg (ref 26.0–34.0)
MCHC: 30.7 g/dL (ref 30.0–36.0)
MCV: 94.3 fL (ref 80.0–100.0)
Monocytes Absolute: 1 10*3/uL (ref 0.1–1.0)
Monocytes Relative: 9 %
Neutro Abs: 9.1 10*3/uL — ABNORMAL HIGH (ref 1.7–7.7)
Neutrophils Relative %: 80 %
Platelets: 189 10*3/uL (ref 150–400)
RBC: 3.52 MIL/uL — ABNORMAL LOW (ref 3.87–5.11)
RDW: 15.1 % (ref 11.5–15.5)
WBC: 11.4 10*3/uL — ABNORMAL HIGH (ref 4.0–10.5)
nRBC: 0 % (ref 0.0–0.2)

## 2018-06-29 LAB — BLOOD GAS, VENOUS
Acid-Base Excess: 6.2 mmol/L — ABNORMAL HIGH (ref 0.0–2.0)
Bicarbonate: 32.7 mmol/L — ABNORMAL HIGH (ref 20.0–28.0)
O2 Saturation: 58.8 %
Patient temperature: 37
pCO2, Ven: 54 mmHg (ref 44.0–60.0)
pH, Ven: 7.39 (ref 7.250–7.430)
pO2, Ven: 31 mmHg — CL (ref 32.0–45.0)

## 2018-06-29 LAB — BRAIN NATRIURETIC PEPTIDE: B Natriuretic Peptide: 238 pg/mL — ABNORMAL HIGH (ref 0.0–100.0)

## 2018-06-29 MED ORDER — VANCOMYCIN HCL IN DEXTROSE 1-5 GM/200ML-% IV SOLN
1000.0000 mg | Freq: Once | INTRAVENOUS | Status: DC
  Filled 2018-06-29: qty 200

## 2018-06-29 MED ORDER — IPRATROPIUM-ALBUTEROL 0.5-2.5 (3) MG/3ML IN SOLN
3.0000 mL | Freq: Once | RESPIRATORY_TRACT | Status: AC
  Administered 2018-06-29: 3 mL via RESPIRATORY_TRACT
  Filled 2018-06-29: qty 3

## 2018-06-29 MED ORDER — SODIUM CHLORIDE 0.9 % IV BOLUS
250.0000 mL | Freq: Once | INTRAVENOUS | Status: AC
  Administered 2018-06-29: 250 mL via INTRAVENOUS

## 2018-06-29 MED ORDER — SODIUM CHLORIDE 0.9 % IV SOLN
2.0000 g | Freq: Once | INTRAVENOUS | Status: AC
  Administered 2018-06-29: 2 g via INTRAVENOUS
  Filled 2018-06-29: qty 2

## 2018-06-29 NOTE — ED Notes (Signed)
Pt returned from CT °

## 2018-06-29 NOTE — ED Triage Notes (Signed)
Pt arrived to the ED via EMS from home for complaints of nausea and vomiting starting today with uncontrolled BG being in the 400's for the part 3 days normalizing today into around 150. Pt has a know infection on the left foot that is being treated. Pt has a HX of CHF, diabetes, 2 stents placed and has a Psychologist, forensic. Pt is AOx4, tired and weak during triage.

## 2018-06-29 NOTE — ED Notes (Addendum)
Pt went to CT

## 2018-06-29 NOTE — ED Notes (Signed)
Hygiene care performed. Linens changed. New brief placed on patient. Tolerated well.

## 2018-06-29 NOTE — ED Provider Notes (Signed)
[facility] Emergency Department Provider Note    First MD Initiated Contact with Patient 06/29/18 1945     (approximate)  I have reviewed the triage vital signs and the nursing notes.   HISTORY  Chief Complaint Nausea and Emesis    HPI [name] is a 85 y.o. female extensive past medical history as listed below presents the ER for nausea vomiting and elevated blood sugars.  Patient denies any pain.  Denies any fevers.  Denies any headache chest pain or shortness of breath.  She is uncertain as to how elevated her blood sugars have been.    Past Medical History:  Diagnosis Date  . Cancer ([place])    skin ca  . CHF (congestive heart failure) ([place])   . Chronic back pain   . COPD (chronic obstructive pulmonary disease) ([place])   . Diabetes mellitus without complication ([place])   . Hypertension   . Poor perfusion of leg    Family History  Problem Relation Age of Onset  . CVA Mother        Congestive Heart Failure, heart attack, hypertension, stroke  . Diabetes Mellitus II Sister   . Heart disease Father        Congestive Heart Failure, heart attack, hypertension   Past Surgical History:  Procedure Laterality Date  . ABDOMINAL HYSTERECTOMY    . BACK SURGERY     x3  . CORONARY STENT PLACEMENT     unknown location per pt  . FOOT SURGERY    . LEG SURGERY    . PACEMAKER PLACEMENT    . TONSILLECTOMY     Patient Active Problem List   Diagnosis Date Noted  . CHF exacerbation ([place]) 06/14/2018  . Cellulitis of left lower extremity 06/02/2018  . ARF (acute renal failure) ([place]) 05/17/2018  . Pneumonia 12/11/2017  . CAP (community acquired pneumonia) 04/14/2017  . COPD (chronic obstructive pulmonary disease) ([place]) 12/07/2016  . UTI (urinary tract infection) 12/07/2016  . Ventral hernia without obstruction or gangrene   . Hyponatremia 09/07/2015  . Acute respiratory failure ([place]) 09/01/2015  . Atrial fibrillation ([place]) 04/10/2015  . Essential  hypertension 04/10/2015  . Diabetes ([place]) 04/10/2015  . Acute on chronic combined systolic and diastolic CHF (congestive heart failure) ([place]) 03/21/2015  . Pulmonary nodule 11/10/2014      Prior to Admission medications   Medication Sig Start Date End Date Taking? Authorizing Provider  acetaminophen (TYLENOL) 325 MG tablet Take 2 tablets (650 mg total) by mouth every 6 (six) hours as needed for mild pain (or Fever >/= 101). Patient not taking: Reported on 06/02/2018 04/18/17   Nicholes Mango, MD  aspirin EC 81 MG tablet Take 81 mg by mouth daily.    [provider]  baclofen (LIORESAL) 10 MG tablet Take 10 mg by mouth 3 (three) times daily as needed.     [provider]  buPROPion (WELLBUTRIN XL) 150 MG 24 hr tablet Take 1 tablet by mouth daily. 11/28/17   [provider]  carvedilol (COREG) 6.25 MG tablet Take 6.25 mg by mouth 2 (two) times daily with a meal.     [provider]  cholecalciferol (VITAMIN D) 1000 units tablet Take 1,000 Units by mouth daily.    [provider]  docusate sodium (COLACE) 100 MG capsule Take 100 mg by mouth 2 (two) times daily.     [provider]  escitalopram (LEXAPRO) 10 MG tablet Take 10 mg by mouth daily.  [provider]  ferrous sulfate 325 (65 FE) MG tablet Take 325 mg by mouth daily with breakfast.    [provider]  furosemide (LASIX) 40 MG tablet Take 1 tablet (40 mg total) by mouth 2 (two) times daily for 10 days. Take lasix  40 mg p.o. twice daily for 2 days then can resume home dose.  Patient told me that she takes Lasix 80 mg alternating with 40 mg. 06/16/18 06/26/18  Epifanio Lesches, MD  gabapentin (NEURONTIN) 100 MG capsule Take 100-200 mg by mouth at bedtime.    [provider]  glimepiride (AMARYL) 4 MG tablet Take 4 mg by mouth 2 (two) times daily. 07/01/17   [provider]  Insulin Pen Needle 31G X 5 MM MISC 4 Syringes by Does not apply route 4 (four)  times daily -  before meals and at bedtime. Use 3 times daily with meals and at bedtime as needed 06/03/18 07/03/18  Nicholes Mango, MD  magnesium oxide (MAG-OX) 400 MG tablet Take 400 mg by mouth daily.    [provider]  Melatonin 1 MG TABS Take 1 mg by mouth at bedtime.    [provider]  metFORMIN (GLUCOPHAGE) 500 MG tablet Take 1 tablet by mouth daily. 12/10/17   [provider]  Multiple Vitamin (MULTIVITAMIN) tablet Take 1 tablet by mouth daily.    [provider]  oxycodone (OXY-IR) 5 MG capsule Take 1-2 capsules (5-10 mg total) by mouth every 6 (six) hours as needed (MOD SEVERE pain). 06/16/18   Epifanio Lesches, MD  pantoprazole (PROTONIX) 40 MG tablet Take 40 mg by mouth daily.    [provider]  PROAIR HFA 108 [phone] Base) MCG/ACT inhaler Inhale 2 puffs into the lungs every 6 (six) hours as needed for wheezing or shortness of breath.  10/02/17   [provider]  Rivaroxaban (XARELTO) 15 MG TABS tablet Take 15 mg by mouth daily.    [provider]  rOPINIRole (REQUIP) 0.5 MG tablet Take 0.5 mg by mouth 2 (two) times daily.    [provider]  sitaGLIPtin (JANUVIA) 100 MG tablet Take 100 mg by mouth daily. 12/06/16   [provider]  spironolactone (ALDACTONE) 25 MG tablet Take 0.5 tablets (12.5 mg total) by mouth daily. 05/18/18   Salary, Avel Peace, MD  vitamin B-12 (CYANOCOBALAMIN) 1000 MCG tablet Take 1,000 mcg by mouth daily.    [provider]  vitamin C (ASCORBIC ACID) 500 MG tablet Take 500 mg by mouth daily.    [provider]  Vitamin D, Ergocalciferol, (DRISDOL) 50000 UNITS CAPS capsule Take 50,000 Units by mouth every 7 (seven) days. Pt takes on Thursday.    [provider]    Allergies Ceftriaxone; Cefuroxime; Cephalosporins; Codeine; Epinephrine; Morphine and related; Buprenorphine hcl; Ciprofloxacin; Latex; Lidocaine; and Procaine    Social History Social History    Tobacco Use  . Smoking status: Never Smoker  . Smokeless tobacco: Never Used  Substance Use Topics  . Alcohol use: No    Alcohol/week: 0.0 standard drinks  . Drug use: No    Review of Systems Patient denies headaches, rhinorrhea, blurry vision, numbness, shortness of breath, chest pain, edema, cough, abdominal pain, nausea, vomiting, diarrhea, dysuria, fevers, rashes or hallucinations unless otherwise stated above in HPI. ____________________________________________   PHYSICAL EXAM:  VITAL SIGNS: Vitals:   06/29/18 2200 06/29/18 2230  BP: (!) 122/48 (!) 133/53  Pulse: 76 70  Resp: 18 18  Temp:  SpO2: 97% 97%    Constitutional: Alert, frail appearing but in NAD Eyes: Conjunctivae are normal.  Head: Atraumatic. Nose: No congestion/rhinnorhea. Mouth/Throat: Mucous membranes are moist.   Neck: No stridor. Painless ROM.  Cardiovascular: Normal rate, regular rhythm. Grossly normal heart sounds.  Good peripheral circulation. Respiratory: Normal respiratory effort.  No retractions. Lungs CTAB. Gastrointestinal: Soft and nontender. No distention. No abdominal bruits. No CVA tenderness. Genitourinary: deferred Musculoskeletal: No lower extremity tenderness nor edema.  chronis left foot wound appears well healing without evidence of erythema or infection  No joint effusions. Neurologic:  Normal speech and language. No gross focal neurologic deficits are appreciated. No facial droop Skin:  Skin is warm, dry and intact. No rash noted. Psychiatric: Mood and affect are normal. Speech and behavior are normal.  ____________________________________________   LABS (all labs ordered are listed, but only abnormal results are displayed)  Results for orders placed or performed during the hospital encounter of 06/29/18 (from the past 24 hour(s))  CBC with Differential/Platelet     Status: Abnormal   Collection Time: 06/29/18  8:04 PM  Result Value Ref Range   WBC 11.4 (H) 4.0 - 10.5  K/uL   RBC 3.52 (L) 3.87 - 5.11 MIL/uL   Hemoglobin 10.2 (L) 12.0 - 15.0 g/dL   HCT 33.2 (L) 36.0 - 46.0 %   MCV 94.3 80.0 - 100.0 fL   MCH 29.0 26.0 - 34.0 pg   MCHC 30.7 30.0 - 36.0 g/dL   RDW 15.1 11.5 - 15.5 %   Platelets 189 150 - 400 K/uL   nRBC 0.0 0.0 - 0.2 %   Neutrophils Relative % 80 %   Neutro Abs 9.1 (H) 1.7 - 7.7 K/uL   Lymphocytes Relative 8 %   Lymphs Abs 1.0 0.7 - 4.0 K/uL   Monocytes Relative 9 %   Monocytes Absolute 1.0 0.1 - 1.0 K/uL   Eosinophils Relative 2 %   Eosinophils Absolute 0.2 0.0 - 0.5 K/uL   Basophils Relative 1 %   Basophils Absolute 0.1 0.0 - 0.1 K/uL   Immature Granulocytes 0 %   Abs Immature Granulocytes 0.05 0.00 - 0.07 K/uL  Comprehensive metabolic panel     Status: Abnormal   Collection Time: 06/29/18  8:04 PM  Result Value Ref Range   Sodium 137 135 - 145 mmol/L   Potassium 3.7 3.5 - 5.1 mmol/L   Chloride 98 98 - 111 mmol/L   CO2 29 22 - 32 mmol/L   Glucose, Bld 281 (H) 70 - 99 mg/dL   BUN 41 (H) 8 - 23 mg/dL   Creatinine, Ser 1.22 (H) 0.44 - 1.00 mg/dL   Calcium 8.9 8.9 - 10.3 mg/dL   Total Protein 7.0 6.5 - 8.1 g/dL   Albumin 3.6 3.5 - 5.0 g/dL   AST 16 15 - 41 U/L   ALT 11 0 - 44 U/L   Alkaline Phosphatase 111 38 - 126 U/L   Total Bilirubin 0.8 0.3 - 1.2 mg/dL   GFR calc non Af Amer 39 (L) >60 mL/min   GFR calc Af Amer 45 (L) >60 mL/min   Anion gap 10 5 - 15  Brain natriuretic peptide     Status: Abnormal   Collection Time: 06/29/18  8:04 PM  Result Value Ref Range   B Natriuretic Peptide 238.0 (H) 0.0 - 100.0 pg/mL  Blood gas, venous     Status: Abnormal   Collection Time: 06/29/18  8:11 PM  Result Value Ref Range  pH, Ven 7.39 7.250 - 7.430   pCO2, Ven 54 44.0 - 60.0 mmHg   pO2, Ven 31.0 (LL) 32.0 - 45.0 mmHg   Bicarbonate 32.7 (H) 20.0 - 28.0 mmol/L   Acid-Base Excess 6.2 (H) 0.0 - 2.0 mmol/L   O2 Saturation 58.8 %   Patient temperature 37.0    Collection site VENOUS    Sample type VENOUS     ____________________________________________ ____________________________________________  RADIOLOGY  I personally reviewed all radiographic images ordered to evaluate for the above acute complaints and reviewed radiology reports and findings.  These findings were personally discussed with the patient.  Please see medical record for radiology report.  ____________________________________________   PROCEDURES  Procedure(s) performed:  Procedures    Critical Care performed: no ____________________________________________   INITIAL IMPRESSION / ASSESSMENT AND PLAN / ED COURSE  Pertinent labs & imaging results that were available during my care of the patient were reviewed by me and considered in my medical decision making (see chart for details).   DDX: pna, electrolyte abn, chf, dehydration, uti, enteritis  [name] is a 85 y.o. who presents to the ED with symptoms as described above.  Patient afebrile but does appear frail and weak.  On arrival patient was hypoxic down to 88% on room air requiring supplemental oxygen 2 L.  Denies any cough but is describing nausea.  Her abdominal exam is soft and benign.  Also complaining of flank pain. CT ordered to evaluate for stone AAA or other acute intra-abdominal process.  CT imaging does show evidence of patchy opacity concerning for pneumonia.  Will start on antibiotics.  Given her age and risk factors she has a port score of 151.   Have discussed with the patient and available family all diagnostics and treatments performed thus far and all questions were answered to the best of my ability. The patient demonstrates understanding and agreement with plan.      As part of my medical decision making, I reviewed the following data within the [place] notes reviewed and incorporated, Labs reviewed, notes from prior ED visits.   ____________________________________________   FINAL CLINICAL  IMPRESSION(S) / ED DIAGNOSES  Final diagnoses:  Acute respiratory failure with hypoxia [facility])  Hospital acquired PNA      NEW MEDICATIONS STARTED DURING THIS VISIT:  New Prescriptions   No medications on file     Note:  This document was prepared using Dragon voice recognition software and may include unintentional dictation errors.    Merlyn Lot, MD 06/29/18 [phone]

## 2018-06-29 NOTE — ED Notes (Signed)
Admitting team at bedside.

## 2018-06-29 NOTE — H&P (Signed)
[facility] [place] [name]    MR#:  [id number]  DATE OF BIRTH:  July 23, 1924  DATE OF ADMISSION:  06/29/2018  PRIMARY CARE PHYSICIAN: Tracie Harrier, MD   REQUESTING/REFERRING PHYSICIAN:   CHIEF COMPLAINT:   Chief Complaint  Patient presents with  . Nausea  . Emesis    HISTORY OF PRESENT ILLNESS: [name]  is a 85 y.o. female with a known history per below, recent hospital discharge for heart failure 1012 days ago, presenting from home with high blood sugars greater than 400 for 3 days, associated nausea, emesis, in the emergency room patient was noted to be hypoxic, tachypneic, white count 11,000, glucose 281, CT abdomen noted for right-sided pneumonia, chest x-ray was negative, patient evaluated in the emergency room, no apparent distress, patient evaluated for acute H CAP, hyperglycemia with history of diabetes mellitus type 2, nausea and emesis.  PAST MEDICAL HISTORY:   Past Medical History:  Diagnosis Date  . Cancer ([place])    skin ca  . CHF (congestive heart failure) ([place])   . Chronic back pain   . COPD (chronic obstructive pulmonary disease) ([place])   . Diabetes mellitus without complication ([place])   . Hypertension   . Poor perfusion of leg     PAST SURGICAL HISTORY:  Past Surgical History:  Procedure Laterality Date  . ABDOMINAL HYSTERECTOMY    . BACK SURGERY     x3  . CORONARY STENT PLACEMENT     unknown location per pt  . FOOT SURGERY    . LEG SURGERY    . PACEMAKER PLACEMENT    . TONSILLECTOMY      SOCIAL HISTORY:  Social History   Tobacco Use  . Smoking status: Never Smoker  . Smokeless tobacco: Never Used  Substance Use Topics  . Alcohol use: No    Alcohol/week: 0.0 standard drinks    FAMILY HISTORY:  Family History  Problem Relation Age of Onset  . CVA Mother        Congestive Heart Failure, heart attack, hypertension, stroke  . Diabetes Mellitus II Sister   . Heart disease Father         Congestive Heart Failure, heart attack, hypertension    DRUG ALLERGIES:  Allergies  Allergen Reactions  . Ceftriaxone Swelling  . Cefuroxime Hives  . Cephalosporins Swelling and Other (See Comments)    Reaction:  Fainting/dry mouth   . Codeine Nausea And Vomiting  . Epinephrine Other (See Comments)    Reaction:  Fainting   . Morphine And Related Other (See Comments)    Reaction:  GI upset   . Buprenorphine Hcl Nausea And Vomiting  . Ciprofloxacin Itching, Swelling and Rash  . Latex Rash  . Lidocaine Rash  . Procaine Rash    REVIEW OF SYSTEMS:   CONSTITUTIONAL: No fever, +fatigue, weakness.  EYES: No blurred or double vision.  EARS, NOSE, AND THROAT: No tinnitus or ear pain.  RESPIRATORY: No cough, shortness of breath, wheezing or hemoptysis.  CARDIOVASCULAR: No chest pain, orthopnea, edema.  GASTROINTESTINAL: + nausea, vomiting, no diarrhea or abdominal pain.  GENITOURINARY: No dysuria, hematuria.  ENDOCRINE: No polyuria, nocturia,  HEMATOLOGY: No anemia, easy bruising or bleeding SKIN: No rash or lesion. MUSCULOSKELETAL: No joint pain or arthritis.   NEUROLOGIC: No tingling, numbness, weakness.  PSYCHIATRY: No anxiety or depression.   MEDICATIONS AT HOME:  Prior to Admission medications   Medication Sig Start Date End Date Taking? Authorizing Provider  acetaminophen (TYLENOL) 325 MG tablet Take 2 tablets (650 mg total) by mouth every 6 (six) hours as needed for mild pain (or Fever >/= 101). Patient not taking: Reported on 06/02/2018 04/18/17   Nicholes Mango, MD  aspirin EC 81 MG tablet Take 81 mg by mouth daily.    [provider]  baclofen (LIORESAL) 10 MG tablet Take 10 mg by mouth 3 (three) times daily as needed.     [provider]  buPROPion (WELLBUTRIN XL) 150 MG 24 hr tablet Take 1 tablet by mouth daily. 11/28/17   [provider]  carvedilol (COREG) 6.25 MG tablet Take 6.25 mg by mouth 2 (two) times daily with a meal.     [provider]  cholecalciferol (VITAMIN D) 1000 units tablet Take 1,000 Units by mouth daily.    [provider]  docusate sodium (COLACE) 100 MG capsule Take 100 mg by mouth 2 (two) times daily.     [provider]  escitalopram (LEXAPRO) 10 MG tablet Take 10 mg by mouth daily.    [provider]  ferrous sulfate 325 (65 FE) MG tablet Take 325 mg by mouth daily with breakfast.    [provider]  furosemide (LASIX) 40 MG tablet Take 1 tablet (40 mg total) by mouth 2 (two) times daily for 10 days. Take lasix  40 mg p.o. twice daily for 2 days then can resume home dose.  Patient told me that she takes Lasix 80 mg alternating with 40 mg. 06/16/18 06/26/18  Epifanio Lesches, MD  gabapentin (NEURONTIN) 100 MG capsule Take 100-200 mg by mouth at bedtime.    [provider]  glimepiride (AMARYL) 4 MG tablet Take 4 mg by mouth 2 (two) times daily. 07/01/17   [provider]  Insulin Pen Needle 31G X 5 MM MISC 4 Syringes by Does not apply route 4 (four) times daily -  before meals and at bedtime. Use 3 times daily with meals and at bedtime as needed 06/03/18 07/03/18  Nicholes Mango, MD  magnesium oxide (MAG-OX) 400 MG tablet Take 400 mg by mouth daily.    [provider]  Melatonin 1 MG TABS Take 1 mg by mouth at bedtime.    [provider]  metFORMIN (GLUCOPHAGE) 500 MG tablet Take 1 tablet by mouth daily. 12/10/17   [provider]  Multiple Vitamin (MULTIVITAMIN) tablet Take 1 tablet by mouth daily.    [provider]  oxycodone (OXY-IR) 5 MG capsule Take 1-2 capsules (5-10 mg total) by mouth every 6 (six) hours as needed (MOD SEVERE pain). 06/16/18   Epifanio Lesches, MD  pantoprazole (PROTONIX) 40 MG tablet Take 40 mg by mouth daily.    [provider]  PROAIR HFA 108 [id number] Base) MCG/ACT inhaler Inhale 2 puffs into the lungs every 6 (six) hours as needed for wheezing or shortness of breath.  10/02/17    [provider]  Rivaroxaban (XARELTO) 15 MG TABS tablet Take 15 mg by mouth daily.    [provider]  rOPINIRole (REQUIP) 0.5 MG tablet Take 0.5 mg by mouth 2 (two) times daily.    [provider]  sitaGLIPtin (JANUVIA) 100 MG tablet Take 100 mg by mouth daily. 12/06/16   [provider]  spironolactone (ALDACTONE) 25 MG tablet Take 0.5 tablets (12.5 mg total) by mouth daily. 05/18/18   Salary, Avel Peace, MD  vitamin B-12 (CYANOCOBALAMIN) 1000 MCG tablet Take 1,000 mcg by mouth daily.    [provider]  vitamin C (ASCORBIC ACID) 500 MG tablet Take 500 mg by mouth daily.    [provider]  Vitamin D, Ergocalciferol, (DRISDOL) 50000 UNITS CAPS capsule Take 50,000 Units by mouth every 7 (seven) days. Pt takes on Thursday.    [provider]      PHYSICAL EXAMINATION:   VITAL SIGNS: Blood pressure 123/61, pulse 77, temperature 98.2 F (36.8 C), temperature source Oral, resp. rate (!) 27, height 5\' 7"  (1.702 m), weight 79.4 kg, SpO2 98 %.  GENERAL:  85 y.o.-year-old patient lying in the bed with no acute distress.  Frail-appearing EYES: Pupils equal, round, reactive to light and accommodation. No scleral icterus. Extraocular muscles intact.  HEENT: Head atraumatic, normocephalic. Oropharynx and nasopharynx clear.  NECK:  Supple, no jugular venous distention. No thyroid enlargement, no tenderness.  LUNGS: Normal breath sounds bilaterally, no wheezing, rales,rhonchi or crepitation. No use of accessory muscles of respiration.  CARDIOVASCULAR: S1, S2 normal. No murmurs, rubs, or gallops.  ABDOMEN: Soft, nontender, nondistended. Bowel sounds present. No organomegaly or mass.  EXTREMITIES: No pedal edema, cyanosis, or clubbing.  NEUROLOGIC: Cranial nerves II through XII are intact. MAES Gait not checked.  PSYCHIATRIC: The patient is alert and oriented x 3.  SKIN: No obvious rash, lesion, or ulcer.   LABORATORY PANEL:   CBC Recent  Labs  Lab 06/29/18 2004  WBC 11.4*  HGB 10.2*  HCT 33.2*  PLT 189  MCV 94.3  MCH 29.0  MCHC 30.7  RDW 15.1  LYMPHSABS 1.0  MONOABS 1.0  EOSABS 0.2  BASOSABS 0.1   ------------------------------------------------------------------------------------------------------------------  Chemistries  Recent Labs  Lab 06/29/18 2004  NA 137  K 3.7  CL 98  CO2 29  GLUCOSE 281*  BUN 41*  CREATININE 1.22*  CALCIUM 8.9  AST 16  ALT 11  ALKPHOS 111  BILITOT 0.8   ------------------------------------------------------------------------------------------------------------------ estimated creatinine clearance is 32.6 mL/min (A) (by C-G formula based on SCr of 1.22 mg/dL (H)). ------------------------------------------------------------------------------------------------------------------ No results for input(s): TSH, T4TOTAL, T3FREE, THYROIDAB in the last 72 hours.  Invalid input(s): FREET3   Coagulation profile No results for input(s): INR, PROTIME in the last 168 hours. ------------------------------------------------------------------------------------------------------------------- No results for input(s): DDIMER in the last 72 hours. -------------------------------------------------------------------------------------------------------------------  Cardiac Enzymes No results for input(s): CKMB, TROPONINI, MYOGLOBIN in the last 168 hours.  Invalid input(s): CK ------------------------------------------------------------------------------------------------------------------ Invalid input(s): POCBNP  ---------------------------------------------------------------------------------------------------------------  Urinalysis    Component Value Date/Time   COLORURINE YELLOW (A) 05/16/2018 2125   APPEARANCEUR CLEAR (A) 05/16/2018 2125   APPEARANCEUR Clear 08/19/2013 1455   LABSPEC 1.014 05/16/2018 2125   LABSPEC 1.019 08/19/2013 1455   PHURINE 5.0 05/16/2018 2125    GLUCOSEU 50 (A) 05/16/2018 2125   GLUCOSEU Negative 08/19/2013 1455   HGBUR NEGATIVE 05/16/2018 2125   BILIRUBINUR NEGATIVE 05/16/2018 2125   BILIRUBINUR Negative 08/19/2013 1455   KETONESUR NEGATIVE 05/16/2018 2125   PROTEINUR NEGATIVE 05/16/2018 2125   NITRITE NEGATIVE 05/16/2018 2125   LEUKOCYTESUR NEGATIVE 05/16/2018 2125   LEUKOCYTESUR 3+ 08/19/2013 1455     RADIOLOGY: Dg Chest Portable 1 View  Result Date: 06/29/2018 CLINICAL DATA:  Nausea, vomiting. EXAM: PORTABLE CHEST 1 VIEW COMPARISON:  Radiograph of June 14, 2018. FINDINGS: Stable cardiomegaly. Atherosclerosis of thoracic aorta is noted. Left-sided pacemaker is unchanged in position. No pneumothorax or pleural effusion is noted. No acute pulmonary disease is noted. Bony thorax is unremarkable. IMPRESSION: No acute cardiopulmonary abnormality seen. Aortic Atherosclerosis (ICD10-I70.0). Electronically Signed   By: Marijo Conception, M.D.  On: 06/29/2018 21:08   Ct Renal Stone Study  Result Date: 06/29/2018 CLINICAL DATA:  Flank pain EXAM: CT ABDOMEN AND PELVIS WITHOUT CONTRAST TECHNIQUE: Multidetector CT imaging of the abdomen and pelvis was performed following the standard protocol without IV contrast. COMPARISON:  Radiograph 12/11/2017, CT chest 06/01/2018, CT abdomen pelvis 11/19/2009, CT chest 10/06/2014 FINDINGS: Lower chest: Stable bilateral lung base nodules. Patchy foci of consolidation at the right lung base with surrounding ground-glass density suspicious for infectious pneumonia. No pleural effusion. Borderline cardiomegaly. Partially visualized cardiac pacing leads. Hepatobiliary: No focal liver abnormality is seen. No gallstones, gallbladder wall thickening, or biliary dilatation. Pancreas: Unremarkable. No pancreatic ductal dilatation or surrounding inflammatory changes. Spleen: Normal in size without focal abnormality. Adrenals/Urinary Tract: Adrenal glands are normal. No hydronephrosis. Probable cysts within the  bilateral kidneys. The bladder is unremarkable Stomach/Bowel: Stomach nonenlarged. No dilated small bowel. No bowel wall thickening. Postsurgical changes at the cecum. Vascular/Lymphatic: Heavily calcified aorta. No aneurysm. No significantly enlarged lymph nodes. Reproductive: Status post hysterectomy. No adnexal masses. Other: Negative for free air or free fluid. Infraumbilical ventral hernia containing mesenteric fat and a portion of the transverse colon. Musculoskeletal: Posterior rods and fixating screws from T11 through S1. Chronic compression deformity at T10 when counting from above. IMPRESSION: 1. No CT evidence for acute intra-abdominal or pelvic abnormality. 2. Patchy foci of consolidation at the right base with surrounding ground-glass density suspicious for pneumonia 3. Extensive postsurgical changes of the thoracolumbar spine 4. Infraumbilical ventral hernia containing fat and part of the transverse colon. Negative for incarceration or obstruction. Electronically Signed   By: Donavan Foil M.D.   On: 06/29/2018 22:43    EKG: Orders placed or performed during the hospital encounter of 06/29/18  . ED EKG  . ED EKG    IMPRESSION AND PLAN: *Acute H CAP  Recent hospitalization for heart failure 12 days ago  Admit to regular nursing for bed on our pneumonia protocol, empiric aztreonam/azithromycin/vancomycin-pharmacy to dose, follow-up on cultures   *Acute hyperglycemia with chronic diabetes mellitus type 2  Blood sugars greater than 400 over the last several days with associated nausea/emesis, last bowel movement was yesterday  Continue home regiment, sliding scale insulin with Accu-Cheks per routine, Lantus 20 units at bedtime x1 now, check hemoglobin A1c determine level control, antiemetics PRN   *Chronic diastolic congestive heart failure without exacerbation  Continue aspirin, Coreg, Lasix, spironolactone  *Chronic pain syndrome Stable on home regiment which will be  continued  *Chronic hypoxic respiratory failure Stable On 2 L via nasal cannula at night  *Chronic benign essential hypertension Stable on home regiment which will be continued  All the records are reviewed and case discussed with ED provider. Management plans discussed with the patient, family and they are in agreement.  CODE STATUS:dnr Code Status History    Date Active Date Inactive Code Status Order ID Comments User Context   06/14/2018 1501 06/16/2018 1634 DNR [id number]  Epifanio Lesches, MD ED   06/02/2018 0138 06/03/2018 1933 DNR [id number]  Arta Silence, MD ED   05/17/2018 0118 05/18/2018 1826 DNR [id number]  Amelia Jo, MD Inpatient   12/11/2017 2059 2020/09/2317 2127 DNR [id number]  Hillary Bow, MD ED   07/15/2017 1830 07/19/2017 1845 DNR [id number]  Gorden Harms, MD Inpatient   04/14/2017 0344 04/18/2017 2138 Full Code [id number]  Lance Coon, MD Inpatient   12/08/2016 0107 12/09/2016 2031 Full Code [id number]  Lance Coon, MD ED   09/07/2015 0441 09/13/2015 1955 Full Code [id number]  Py[place],  Reatha Harps, [place] Inpatient   09/01/2015 1533 09/03/2015 1907 Full Code [id number]  Epifanio Lesches, MD ED   03/21/2015 0626 03/22/2015 1939 Full Code [id number]  Harrie Foreman, MD Inpatient    Questions for Most Recent Historical Code Status (Order [id number])    Question Answer Comment   In the event of cardiac or respiratory ARREST Do not call a "code blue"    In the event of cardiac or respiratory ARREST Do not perform Intubation, CPR, defibrillation or ACLS    In the event of cardiac or respiratory ARREST Use medication by any route, position, wound care, and other measures to relive pain and suffering. May use oxygen, suction and manual treatment of airway obstruction as needed for comfort.    Comments NMP         Advance Directive Documentation     Most Recent Value  Type of Advance Directive  Healthcare Power of Attorney  Pre-existing out of facility DNR order  (yellow form or pink MOST form)  -  "MOST" Form in Place?  -       TOTAL TIME TAKING CARE OF THIS PATIENT: 40 minutes.    Avel Peace Salary M.D on 06/29/2018   Between 7am to 6pm - Pager - [phone]  After 6pm go to [email or web address] - password [email or web address] [facility]  Office  [phone]  CC: Primary care physician; Tracie Harrier, MD   Note: This dictation was prepared with Dragon dictation along with smaller phrase technology. Any transcriptional errors that result from this process are unintentional.

## 2018-06-30 ENCOUNTER — Other Ambulatory Visit: Payer: Self-pay

## 2018-06-30 LAB — INFLUENZA PANEL BY PCR (TYPE A & B)
INFLAPCR: NEGATIVE
Influenza B By PCR: NEGATIVE

## 2018-06-30 LAB — URINALYSIS, COMPLETE (UACMP) WITH MICROSCOPIC
Bacteria, UA: NONE SEEN
Bilirubin Urine: NEGATIVE
Glucose, UA: NEGATIVE mg/dL
Hgb urine dipstick: NEGATIVE
Ketones, ur: NEGATIVE mg/dL
Nitrite: NEGATIVE
Protein, ur: NEGATIVE mg/dL
Specific Gravity, Urine: 1.009 (ref 1.005–1.030)
pH: 5 (ref 5.0–8.0)

## 2018-06-30 LAB — GLUCOSE, CAPILLARY
Glucose-Capillary: 109 mg/dL — ABNORMAL HIGH (ref 70–99)
Glucose-Capillary: 116 mg/dL — ABNORMAL HIGH (ref 70–99)
Glucose-Capillary: 250 mg/dL — ABNORMAL HIGH (ref 70–99)
Glucose-Capillary: 217 mg/dL — ABNORMAL HIGH (ref 70–99)
Glucose-Capillary: 114 mg/dL — ABNORMAL HIGH (ref 70–99)

## 2018-06-30 LAB — HEMOGLOBIN A1C
Hgb A1c MFr Bld: 8.7 % — ABNORMAL HIGH (ref 4.8–5.6)
Mean Plasma Glucose: 202.99 mg/dL

## 2018-06-30 LAB — STREP PNEUMONIAE URINARY ANTIGEN: Strep Pneumo Urinary Antigen: NEGATIVE

## 2018-06-30 LAB — MRSA PCR SCREENING: MRSA BY PCR: NEGATIVE

## 2018-06-30 LAB — PROCALCITONIN: Procalcitonin: <0.1 ng/mL

## 2018-06-30 MED ORDER — INSULIN GLARGINE 100 UNIT/ML ~~LOC~~ SOLN
20.0000 [IU] | Freq: Once | SUBCUTANEOUS | Status: DC
  Filled 2018-06-30: qty 0.2

## 2018-06-30 MED ORDER — RIVAROXABAN 15 MG PO TABS
15.0000 mg | ORAL_TABLET | Freq: Every day | ORAL | Status: DC
  Administered 2018-06-30 – 2018-07-03 (×4): 15 mg via ORAL
  Filled 2018-06-30 (×4): qty 1

## 2018-06-30 MED ORDER — SPIRONOLACTONE 25 MG PO TABS
12.5000 mg | ORAL_TABLET | Freq: Every day | ORAL | Status: DC
  Administered 2018-07-01 – 2018-07-03 (×3): 12.5 mg via ORAL
  Filled 2018-06-30 (×2): qty 1
  Filled 2018-06-30 (×3): qty 0.5
  Filled 2018-06-30: qty 1
  Filled 2018-06-30: qty 0.5

## 2018-06-30 MED ORDER — MAGNESIUM OXIDE 400 (241.3 MG) MG PO TABS
400.0000 mg | ORAL_TABLET | Freq: Every day | ORAL | Status: DC
  Administered 2018-06-30 – 2018-07-03 (×4): 400 mg via ORAL
  Filled 2018-06-30 (×4): qty 1

## 2018-06-30 MED ORDER — MELATONIN 5 MG PO TABS
2.5000 mg | ORAL_TABLET | Freq: Every day | ORAL | Status: DC
  Administered 2018-06-30 – 2018-07-02 (×3): 2.5 mg via ORAL
  Filled 2018-06-30 (×4): qty 0.5

## 2018-06-30 MED ORDER — ROPINIROLE HCL 1 MG PO TABS
0.5000 mg | ORAL_TABLET | Freq: Two times a day (BID) | ORAL | Status: DC
  Administered 2018-06-30 – 2018-07-03 (×7): 0.5 mg via ORAL
  Filled 2018-06-30 (×7): qty 1

## 2018-06-30 MED ORDER — VITAMIN B-12 1000 MCG PO TABS
1000.0000 ug | ORAL_TABLET | Freq: Every day | ORAL | Status: DC
  Administered 2018-06-30 – 2018-07-03 (×4): 1000 ug via ORAL
  Filled 2018-06-30 (×4): qty 1

## 2018-06-30 MED ORDER — BUDESONIDE 0.5 MG/2ML IN SUSP
0.5000 mg | Freq: Two times a day (BID) | RESPIRATORY_TRACT | Status: DC
  Administered 2018-06-30 – 2018-07-03 (×6): 0.5 mg via RESPIRATORY_TRACT
  Filled 2018-06-30 (×6): qty 2

## 2018-06-30 MED ORDER — VANCOMYCIN HCL 10 G IV SOLR
1500.0000 mg | Freq: Once | INTRAVENOUS | Status: AC
  Administered 2018-06-30: 1500 mg via INTRAVENOUS
  Filled 2018-06-30: qty 1500

## 2018-06-30 MED ORDER — CARVEDILOL 3.125 MG PO TABS
6.2500 mg | ORAL_TABLET | Freq: Two times a day (BID) | ORAL | Status: DC
  Administered 2018-06-30 – 2018-07-03 (×7): 6.25 mg via ORAL
  Filled 2018-06-30 (×7): qty 2

## 2018-06-30 MED ORDER — SODIUM CHLORIDE 0.9 % IV SOLN
500.0000 mg | INTRAVENOUS | Status: DC
  Administered 2018-06-30 – 2018-07-03 (×4): 500 mg via INTRAVENOUS
  Filled 2018-06-30 (×4): qty 500

## 2018-06-30 MED ORDER — CLOPIDOGREL BISULFATE 75 MG PO TABS
75.0000 mg | ORAL_TABLET | Freq: Every day | ORAL | Status: DC
  Administered 2018-07-01 – 2018-07-03 (×3): 75 mg via ORAL
  Filled 2018-06-30 (×3): qty 1

## 2018-06-30 MED ORDER — INSULIN ASPART 100 UNIT/ML ~~LOC~~ SOLN
0.0000 [IU] | Freq: Every day | SUBCUTANEOUS | Status: DC
  Administered 2018-07-01: 3 [IU] via SUBCUTANEOUS
  Filled 2018-06-30: qty 1

## 2018-06-30 MED ORDER — GLIMEPIRIDE 4 MG PO TABS
4.0000 mg | ORAL_TABLET | Freq: Two times a day (BID) | ORAL | Status: DC
  Administered 2018-06-30: 4 mg via ORAL
  Filled 2018-06-30 (×3): qty 1

## 2018-06-30 MED ORDER — ASPIRIN EC 81 MG PO TBEC
81.0000 mg | DELAYED_RELEASE_TABLET | Freq: Every day | ORAL | Status: DC
  Administered 2018-06-30 – 2018-07-03 (×4): 81 mg via ORAL
  Filled 2018-06-30 (×4): qty 1

## 2018-06-30 MED ORDER — BACLOFEN 10 MG PO TABS
10.0000 mg | ORAL_TABLET | Freq: Three times a day (TID) | ORAL | Status: DC | PRN
  Filled 2018-06-30: qty 1

## 2018-06-30 MED ORDER — SODIUM CHLORIDE 0.9 % IV SOLN: 1.0000 g | Freq: Three times a day (TID) | INTRAVENOUS | Status: DC

## 2018-06-30 MED ORDER — INSULIN ASPART 100 UNIT/ML ~~LOC~~ SOLN
0.0000 [IU] | Freq: Three times a day (TID) | SUBCUTANEOUS | Status: DC
  Administered 2018-06-30 (×2): 7 [IU] via SUBCUTANEOUS
  Administered 2018-07-01 (×2): 4 [IU] via SUBCUTANEOUS
  Administered 2018-07-02: 3 [IU] via SUBCUTANEOUS
  Administered 2018-07-02: 4 [IU] via SUBCUTANEOUS
  Administered 2018-07-02: 11 [IU] via SUBCUTANEOUS
  Administered 2018-07-03: 3 [IU] via SUBCUTANEOUS
  Filled 2018-06-30 (×7): qty 1

## 2018-06-30 MED ORDER — IPRATROPIUM-ALBUTEROL 0.5-2.5 (3) MG/3ML IN SOLN
3.0000 mL | Freq: Four times a day (QID) | RESPIRATORY_TRACT | Status: DC
  Administered 2018-06-30 – 2018-07-03 (×12): 3 mL via RESPIRATORY_TRACT
  Filled 2018-06-30 (×13): qty 3

## 2018-06-30 MED ORDER — GABAPENTIN 100 MG PO CAPS
100.0000 mg | ORAL_CAPSULE | Freq: Every day | ORAL | Status: DC
  Administered 2018-06-30 – 2018-07-02 (×3): 100 mg via ORAL
  Filled 2018-06-30 (×3): qty 1

## 2018-06-30 MED ORDER — PIPERACILLIN-TAZOBACTAM 3.375 G IVPB
3.3750 g | Freq: Three times a day (TID) | INTRAVENOUS | Status: DC
  Administered 2018-06-30 – 2018-07-01 (×4): 3.375 g via INTRAVENOUS
  Filled 2018-06-30 (×4): qty 50

## 2018-06-30 MED ORDER — DOCUSATE SODIUM 100 MG PO CAPS
100.0000 mg | ORAL_CAPSULE | Freq: Two times a day (BID) | ORAL | Status: DC
  Administered 2018-06-30 – 2018-07-03 (×7): 100 mg via ORAL
  Filled 2018-06-30 (×7): qty 1

## 2018-06-30 MED ORDER — FERROUS SULFATE 325 (65 FE) MG PO TABS
325.0000 mg | ORAL_TABLET | Freq: Every day | ORAL | Status: DC
  Administered 2018-06-30 – 2018-07-03 (×4): 325 mg via ORAL
  Filled 2018-06-30 (×4): qty 1

## 2018-06-30 MED ORDER — PANTOPRAZOLE SODIUM 40 MG PO TBEC
40.0000 mg | DELAYED_RELEASE_TABLET | Freq: Every day | ORAL | Status: DC
  Administered 2018-06-30 – 2018-07-03 (×4): 40 mg via ORAL
  Filled 2018-06-30 (×4): qty 1

## 2018-06-30 MED ORDER — VANCOMYCIN HCL IN DEXTROSE 750-5 MG/150ML-% IV SOLN
750.0000 mg | INTRAVENOUS | Status: DC
  Filled 2018-06-30: qty 150

## 2018-06-30 MED ORDER — BUPROPION HCL ER (XL) 150 MG PO TB24
150.0000 mg | ORAL_TABLET | Freq: Every day | ORAL | Status: DC
  Administered 2018-06-30 – 2018-07-03 (×4): 150 mg via ORAL
  Filled 2018-06-30 (×4): qty 1

## 2018-06-30 MED ORDER — METFORMIN HCL 500 MG PO TABS
500.0000 mg | ORAL_TABLET | Freq: Every day | ORAL | Status: DC
  Administered 2018-06-30 – 2018-07-03 (×4): 500 mg via ORAL
  Filled 2018-06-30 (×4): qty 1

## 2018-06-30 MED ORDER — OXYCODONE HCL 5 MG PO TABS
5.0000 mg | ORAL_TABLET | Freq: Four times a day (QID) | ORAL | Status: DC | PRN
  Administered 2018-06-30: 5 mg via ORAL
  Administered 2018-07-01 – 2018-07-02 (×2): 10 mg via ORAL
  Filled 2018-06-30: qty 2
  Filled 2018-06-30: qty 1
  Filled 2018-06-30: qty 2

## 2018-06-30 MED ORDER — ESCITALOPRAM OXALATE 10 MG PO TABS
10.0000 mg | ORAL_TABLET | Freq: Every day | ORAL | Status: DC
  Administered 2018-06-30: 10 mg via ORAL
  Filled 2018-06-30: qty 1

## 2018-06-30 MED ORDER — MELATONIN 1 MG PO TABS: 1.0000 mg | ORAL_TABLET | Freq: Every day | ORAL | Status: DC

## 2018-06-30 MED ORDER — FUROSEMIDE 40 MG PO TABS
40.0000 mg | ORAL_TABLET | Freq: Two times a day (BID) | ORAL | Status: DC
  Administered 2018-07-01 – 2018-07-03 (×5): 40 mg via ORAL
  Filled 2018-06-30 (×6): qty 1

## 2018-06-30 NOTE — Care Management Note (Signed)
Case Management Note  Patient Details  [name] MRN: [id number] Date of Birth: [birth year]  Subjective/Objective:                  Met with patient and spouse about discharge plans Provided the patient with the [facility] list  Patient is current on service with [facility] and wants to continue using it Patient has 24 hour care with 2 caregivers Patient uses [facility] Patient can afford medications Patient has a shower chair a bedside commode has a rolling walker and a rollator Patient has a care taker take her to the Doctor   Action/Plan:  Provided [facility] list per CMS.gov Expected Discharge Date:                  Expected Discharge Plan:     In-House Referral:     Discharge planning Services  CM Consult  Post Acute Care Choice:    Choice offered to:     DME Arranged:    DME Agency:     HH Arranged:  PT, OT, RN [place] Agency:  [facility]  Status of Service:  In process, will continue to follow  If discussed at Long Length of Stay Meetings, dates discussed:    Additional Comments:  Su Hilt, RN 06/30/2018, 4:10 PM

## 2018-06-30 NOTE — Clinical Social Work Note (Signed)
Clinical Social Work Assessment  Patient Details  [name] MRN: [id number] Date of Birth: [birth year]  Date of referral:  06/30/18               Reason for consult:  Facility Placement                Permission sought to share information with:    Permission granted to share information::     Name::        Agency::     Relationship::     Contact Information:     Housing/Transportation Living arrangements for the past 2 months:  Single Family Home Source of Information:  Patient, Spouse Patient Interpreter Needed:  None Criminal Activity/Legal Involvement Pertinent to Current Situation/Hospitalization:  No - Comment as needed Significant Relationships:  Spouse Lives with:  Spouse Do you feel safe going back to the place where you live?  Yes Need for family participation in patient care:  Yes (Comment)  Care giving concerns:  Patient lives in [place] with [name].    Social Worker assessment / plan:  Holiday representative (CSW) received verbal consult from PT that recommendation is SNF. CSW met with patient and [name] was at bedside. Patient was alert and oriented X4 and was sitting up in the chair. CSW introduced self and explained role of CSW department. Per husband patient lives with him in [place]. CSW explained SNF process. Patient and husband refused SNF. Per patient she has [facility] PT coming out to the house now and wants to continue that. Per husband he watched patient work with PT today and feels comfortable taking her home. RN case manager aware of above. CSW will continue to follow and assist as needed.   Employment status:  Retired Nurse, adult PT Recommendations:  [place] / Referral to community resources:  Other (Comment Required)(Patient and her husband refused SNF and want home health. )  Patient/Family's Response to care:  Patient prefers to D/C home.    Patient/Family's Understanding of and Emotional Response to Diagnosis, Current Treatment, and Prognosis:  Patient and her husband were very pleasant and thanked CSW for visit.   Emotional Assessment Appearance:  Appears stated age Attitude/Demeanor/Rapport:    Affect (typically observed):  Accepting, Adaptable, Pleasant Orientation:  Oriented to Self, Oriented to Place, Oriented to  Time, Oriented to Situation Alcohol / Substance use:  Not Applicable Psych involvement (Current and /or in the community):  No (Comment)  Discharge Needs  Concerns to be addressed:  Discharge Planning Concerns Readmission within the last 30 days:  No Current discharge risk:  Dependent with Mobility, Chronically ill Barriers to Discharge:  Continued Medical Work up   [employer], Veronia Beets, LCSW 06/30/2018, 5:16 PM

## 2018-06-30 NOTE — Progress Notes (Signed)
Family Meeting Note  Advance Directive:yes  Today a meeting took place with the Patient.  Patient is able to participate  The following clinical team members were present during this meeting:MD  The following were discussed:Patient's diagnosis:hcap , Patient's progosis: Unable to determine and Goals for treatment: DNR  Additional follow-up to be provided: prn  Time spent during discussion:20 minutes  Gorden Harms, MD

## 2018-06-30 NOTE — Progress Notes (Addendum)
Vancomycin and aztreonam consult  Vancomycin 750 mg IV Q 24 hrs. Goal AUC 400-550. Expected AUC: 509 SCr used: 1.22  1500 mg load dose ordered.  Aztreonam changed to Zosyn 3.375 grams q 8 hours, which patient has tolerated in the past.  Sim Boast, PharmD, BCPS  06/30/18 5:36 AM

## 2018-06-30 NOTE — NC FL2 (Signed)
[place] LEVEL OF CARE SCREENING TOOL     IDENTIFICATION  Patient [name] Birthdate: [birth year] Sex: female Admission Date (Current Location): 06/29/2018  [place] and [place] Number:  Engineering geologist and Address:  [facility], [address] [place], [place] [address]      Provider Number: [id number]  Attending Physician Name and Address:  Vaughan Basta, *  Relative Name and Phone Number:       Current Level of Care: Hospital Recommended Level of Care: [place] Prior Approval Number:    Date Approved/Denied:   PASRR Number: [id number])  Discharge Plan: SNF    Current Diagnoses: Patient Active Problem List   Diagnosis Date Noted  . CHF exacerbation ([place]) 06/14/2018  . Cellulitis of left lower extremity 06/02/2018  . ARF (acute renal failure) ([place]) 05/17/2018  . Pneumonia 12/11/2017  . CAP (community acquired pneumonia) 04/14/2017  . COPD (chronic obstructive pulmonary disease) ([place]) 12/07/2016  . UTI (urinary tract infection) 12/07/2016  . Ventral hernia without obstruction or gangrene   . Hyponatremia 09/07/2015  . Acute respiratory failure ([place]) 09/01/2015  . Atrial fibrillation ([place]) 04/10/2015  . Essential hypertension 04/10/2015  . Diabetes ([place]) 04/10/2015  . Acute on chronic combined systolic and diastolic CHF (congestive heart failure) ([place]) 03/21/2015  . Pulmonary nodule 11/10/2014    Orientation RESPIRATION BLADDER Height & Weight     Self, Time, Situation, Place  O2(2 Liters Oxygen. ) Continent Weight: 177 lb 11.1 oz (80.6 kg) Height:  5\' 7"  (170.2 cm)  BEHAVIORAL SYMPTOMS/MOOD NEUROLOGICAL BOWEL NUTRITION STATUS      Continent Diet(Diet: 2 Grams Soduim. )  AMBULATORY STATUS COMMUNICATION OF NEEDS Skin   Extensive Assist Verbally Other (Comment)(Left Diabetic Foot Ulcer. )                       Personal Care Assistance Level of Assistance  Bathing,  Feeding, Dressing Bathing Assistance: Limited assistance Feeding assistance: Independent Dressing Assistance: Limited assistance     Functional Limitations Info  Sight, Hearing, Speech Sight Info: Adequate Hearing Info: Impaired Speech Info: Adequate    SPECIAL CARE FACTORS FREQUENCY  PT (By licensed PT), OT (By licensed OT)     PT Frequency: (5) OT Frequency: (5)            Contractures      Additional Factors Info  Code Status, Allergies Code Status Info: (DNR ) Allergies Info: (Ceftriaxone, Cefuroxime, Cephalosporins, Codeine, Epinephrine, Morphine And Related, Buprenorphine Hcl, Ciprofloxacin, Latex, Lidocaine, Procaine)           Current Medications (06/30/2018):  This is the current hospital active medication list Current Facility-Administered Medications  Medication Dose Route Frequency Provider Last Rate Last Dose  . aspirin EC tablet 81 mg  81 mg Oral Daily Salary, Montell D, MD   81 mg at 06/30/18 0856  . azithromycin (ZITHROMAX) 500 mg in sodium chloride 0.9 % 250 mL IVPB  500 mg Intravenous Q24H Loney Hering D, MD 250 mL/hr at 06/30/18 0623 500 mg at 06/30/18 0623  . baclofen (LIORESAL) tablet 10 mg  10 mg Oral TID PRN Salary, Montell D, MD      . budesonide (PULMICORT) nebulizer solution 0.5 mg  0.5 mg Nebulization BID Salary, Montell D, MD      . buPROPion (WELLBUTRIN XL) 24 hr tablet 150 mg  150 mg Oral Daily Salary, Montell D, MD   150 mg at 06/30/18 0855  . carvedilol (  COREG) tablet 6.25 mg  6.25 mg Oral BID WC Salary, Montell D, MD   6.25 mg at 06/30/18 0857  . [START ON 07/01/2018] clopidogrel (PLAVIX) tablet 75 mg  75 mg Oral Daily Vaughan Basta, MD      . docusate sodium (COLACE) capsule 100 mg  100 mg Oral BID Loney Hering D, MD   100 mg at 06/30/18 0856  . ferrous sulfate tablet 325 mg  325 mg Oral Q breakfast Salary, Montell D, MD   325 mg at 06/30/18 0856  . furosemide (LASIX) tablet 40 mg  40 mg Oral BID Salary, Montell D, MD      .  gabapentin (NEURONTIN) capsule 100-200 mg  100-200 mg Oral QHS Salary, Montell D, MD      . glimepiride (AMARYL) tablet 4 mg  4 mg Oral BID Loney Hering D, MD   4 mg at 06/30/18 0858  . insulin aspart (novoLOG) injection 0-20 Units  0-20 Units Subcutaneous TID WC Salary, Montell D, MD   7 Units at 06/30/18 1407  . insulin aspart (novoLOG) injection 0-5 Units  0-5 Units Subcutaneous QHS Salary, Montell D, MD      . insulin glargine (LANTUS) injection 20 Units  20 Units Subcutaneous Once Salary, Montell D, MD      . ipratropium-albuterol (DUONEB) 0.5-2.5 (3) MG/3ML nebulizer solution 3 mL  3 mL Nebulization Q6H Salary, Montell D, MD   3 mL at 06/30/18 0831  . magnesium oxide (MAG-OX) tablet 400 mg  400 mg Oral Daily Salary, Montell D, MD   400 mg at 06/30/18 0857  . Melatonin TABS 2.5 mg  2.5 mg Oral QHS Salary, Montell D, MD      . metFORMIN (GLUCOPHAGE) tablet 500 mg  500 mg Oral Daily Salary, Montell D, MD   500 mg at 06/30/18 0857  . oxyCODONE (Oxy IR/ROXICODONE) immediate release tablet 5-10 mg  5-10 mg Oral Q6H PRN Salary, Montell D, MD   5 mg at 06/30/18 0319  . pantoprazole (PROTONIX) EC tablet 40 mg  40 mg Oral Daily Salary, Montell D, MD   40 mg at 06/30/18 0856  . piperacillin-tazobactam (ZOSYN) IVPB 3.375 g  3.375 g Intravenous Q8H Sridharan, Prasanna, MD 12.5 mL/hr at 06/30/18 1420 3.375 g at 06/30/18 1420  . Rivaroxaban (XARELTO) tablet 15 mg  15 mg Oral Daily Salary, Montell D, MD   15 mg at 06/30/18 0857  . rOPINIRole (REQUIP) tablet 0.5 mg  0.5 mg Oral BID Loney Hering D, MD   0.5 mg at 06/30/18 0856  . spironolactone (ALDACTONE) tablet 12.5 mg  12.5 mg Oral Daily Salary, Montell D, MD      . vitamin B-12 (CYANOCOBALAMIN) tablet 1,000 mcg  1,000 mcg Oral Daily Salary, Montell D, MD   1,000 mcg at 06/30/18 9357     Discharge Medications: Please see discharge summary for a list of discharge medications.  Relevant Imaging Results:  Relevant Lab Results:   Additional  Information (SSN: [id number])  Loghan Subia, Veronia Beets, LCSW

## 2018-06-30 NOTE — Evaluation (Signed)
Physical Therapy Evaluation Patient Details [name] MRN: [id number] DOB: September 12, 1924 Today's Date: 06/30/2018   History of Present Illness  Pt is 85 yo female admitted for elevated blood sugar, nausea and emesis, hypoxia, tachycardia, PNA. Recent admit 12 days ago for acute CHF. PMH of L foot wound/infection, pacemaker, skin cx, back pain, COPD, DM, HTN.  Clinical Impression  Pt alert, oriented to self, place and situation, behavior WFLs throughout session. Pt reported living with husband in one story home, with personal care attendants that come AM and PM to assist with ADLs/IADLs. Pt stated she is able to ambulate household distances with rollator, and does endorse recent stumbles or balance issues. Pt reported using 2L O2 at night.   Pt demonstrated bed mobility with supervision and use of bed rails. Sitting EOB spO2 fluctuating from 86-92%, improvement noted with cues for pursed lip breathing, mild complaints of dizziness. Sit<>stand transfer multiple times this session, minAx1 from chair surface. Ambulated ~74ft with CGA and RW, quickly fatigued, SOB. SpO2 in mid 80s. At this time RN entered room, and gave consent for pt to be placed on 2L O2, spO2 >90%. With further activity pt desatted to high 80s but improved SOB and decreased rebound time needed. Overall the patient demonstrated limitations that impede the pts ability to perform functional activities, safety, and mobility and would benefit from skilled PT intervention. Pending pt progress, recommendation is STR due to current level of assist needed as well as decreased activity tolerance/weakness.     Follow Up Recommendations SNF    Equipment Recommendations  None recommended by PT    Recommendations for Other Services       Precautions / Restrictions Precautions Precautions: Fall Precaution Comments: monitor O2 sats Required Braces or Orthoses: Other Brace(ortho shoe) Restrictions Weight Bearing Restrictions:  No Other Position/Activity Restrictions: post op shoe on left LE due to existing wound. Pt also prefers to ambulate with R shoe in place.       Mobility  Bed Mobility Overal bed mobility: Needs Assistance Bed Mobility: Supine to Sit     Supine to sit: Supervision     General bed mobility comments: use of bed rails and RW stabilized by PT to scoot to EOB  Transfers Overall transfer level: Needs assistance Equipment used: Rolling walker (2 wheeled) Transfers: Sit to/from Stand Sit to Stand: Min guard;Min assist;From elevated surface         General transfer comment: CGA from elevated bed surface. MinAx1 from chair surface with extensive use of chair arms.  Ambulation/Gait Ambulation/Gait assistance: Min guard Gait Distance (Feet): 3 Feet Assistive device: Rolling walker (2 wheeled) Gait Pattern/deviations: Step-to pattern;Shuffle;Wide base of support Gait velocity: reduced   General Gait Details: Trunk flexion, shuffling step  Stairs            Wheelchair Mobility    Modified Rankin (Stroke Patients Only)       Balance Overall balance assessment: Needs assistance Sitting-balance support: No upper extremity supported;Feet supported Sitting balance-Leahy Scale: Fair       Standing balance-Leahy Scale: Poor                               Pertinent Vitals/Pain Pain Assessment: Faces Faces Pain Scale: Hurts a little bit Pain Location: Low back Pain Descriptors / Indicators: Discomfort;Grimacing;Guarding Pain Intervention(s): Limited activity within patient's tolerance;Monitored during session;Repositioned    Home Living Family/patient expects to be discharged to:: Private residence Living  Arrangements: Spouse/significant other Available Help at Discharge: Family;Available PRN/intermittently;Personal care attendant Type of Home: House Home Access: Ramped entrance     Home Layout: One level;Other (Comment);Able to live on main level with  bedroom/bathroom(1 step to get to rest of house, but has rearranged to avoid that step) Home Equipment: Walker - 2 wheels;Shower seat;Wheelchair - manual;Shower seat - built in;Grab bars - toilet;Grab bars - tub/shower;Bedside commode      Prior Function Level of Independence: Needs assistance   Gait / Transfers Assistance Needed: Ambulates in home with rollator, states she is about to do this without assistance  ADL's / Homemaking Assistance Needed: has caretakers assist with ADLs, IADLs  Comments: Pt reported that she has had stumbles or near falls in the last 6 months. Denies true falls. Personal care aides that come both morning in night per pt.     Hand Dominance   Dominant Hand: Right    Extremity/Trunk Assessment   Upper Extremity Assessment Upper Extremity Assessment: Generalized weakness    Lower Extremity Assessment Lower Extremity Assessment: Generalized weakness       Communication   Communication: HOH  Cognition Arousal/Alertness: Awake/alert Behavior During Therapy: WFL for tasks assessed/performed Overall Cognitive Status: Within Functional Limits for tasks assessed                                        General Comments General comments (skin integrity, edema, etc.): Pt with bruise to RLE shin, claimed she bumped it at some point but unaware of when.    Exercises Other Exercises Other Exercises: Pt performed standing marching with RW and CGA, quickly fatigued.   Assessment/Plan    PT Assessment Patient needs continued PT services  PT Problem List Decreased range of motion;Decreased activity tolerance;Decreased balance;Decreased mobility;Decreased coordination;Decreased knowledge of use of DME;Decreased safety awareness;Cardiopulmonary status limiting activity;Decreased skin integrity;Decreased strength       PT Treatment Interventions DME instruction;Gait training;Functional mobility training;Therapeutic activities;Therapeutic  exercise;Balance training;Neuromuscular re-education;Patient/family education    PT Goals (Current goals can be found in the Care Plan section)  Acute Rehab PT Goals Patient Stated Goal: to go home PT Goal Formulation: With patient Time For Goal Achievement: 07/14/18 Potential to Achieve Goals: Fair    Frequency Min 2X/week   Barriers to discharge Decreased caregiver support      Co-evaluation               AM-PAC PT "6 Clicks" Mobility  Outcome Measure Help needed turning from your back to your side while in a flat bed without using bedrails?: A Little Help needed moving from lying on your back to sitting on the side of a flat bed without using bedrails?: A Little Help needed moving to and from a bed to a chair (including a wheelchair)?: A Little Help needed standing up from a chair using your arms (e.g., wheelchair or bedside chair)?: A Little Help needed to walk in hospital room?: A Lot Help needed climbing 3-5 steps with a railing? : Total 6 Click Score: 15    End of Session Equipment Utilized During Treatment: Gait belt;Oxygen Activity Tolerance: Patient limited by fatigue Patient left: with chair alarm set;in chair;with nursing/sitter in room;with call bell/phone within reach;with family/visitor present Nurse Communication: Mobility status PT Visit Diagnosis: Unsteadiness on feet (R26.81);History of falling (Z91.81);Adult, failure to thrive (R62.7);Muscle weakness (generalized) (M62.81)    Time: 0093-8182 PT Time Calculation (min) (ACUTE  ONLY): 31 min   Charges:   PT Evaluation $PT Eval Moderate Complexity: 1 Mod PT Treatments $Therapeutic Activity: 8-22 mins        Lieutenant Diego PT, DPT 2:40 PM,06/30/18 [phone]

## 2018-06-30 NOTE — Progress Notes (Signed)
Inpatient Diabetes Program Recommendations  AACE/ADA: New Consensus Statement on Inpatient Glycemic Control (2015)  Target Ranges:  Prepandial:   less than 140 mg/dL      Peak postprandial:   less than 180 mg/dL (1-2 hours)      Critically ill patients:  140 - 180 mg/dL   Lab Results  Component Value Date   GLUCAP 250 (H) 06/30/2018   HGBA1C 8.7 (H) 06/30/2018    Review of Glycemic Control Results for [name], [name] (MRN [id number]) as of 06/30/2018 13:32  Ref. Range 06/30/2018 05:42 06/30/2018 07:55 06/30/2018 12:22  Glucose-Capillary Latest Ref Range: 70 - 99 mg/dL 109 (H) 116 (H) 250 (H)   Diabetes history: DM2 Outpatient Diabetes medications: Amaryl 4 mg bid + Metformin 500 mg qd +Januvia 100 mg qd Current orders for Inpatient glycemic control: Amaryl 4 mg bid + Novolog resistant scale tid + hs 0-5 units + one time dose of Lantus ordered  Inpatient Diabetes Program Recommendations:   While in the hospital: -D/C Amaryl -Decrease Novolog correction to moderate tid Would not recommend basal insulin @ this time  Thank you, Nani Gasser. Chrys Landgrebe, RN, MSN, CDE  Diabetes Coordinator Inpatient Glycemic Control Team Team Pager [phone] (8am-5pm) 06/30/2018 1:39 PM

## 2018-07-01 LAB — BASIC METABOLIC PANEL
Anion gap: 8 (ref 5–15)
BUN: 36 mg/dL — AB (ref 8–23)
CO2: 29 mmol/L (ref 22–32)
Calcium: 8.5 mg/dL — ABNORMAL LOW (ref 8.9–10.3)
Chloride: 100 mmol/L (ref 98–111)
Creatinine, Ser: 1.1 mg/dL — ABNORMAL HIGH (ref 0.44–1.00)
GFR calc Af Amer: 51 mL/min — ABNORMAL LOW (ref >60)
GFR calc non Af Amer: 44 mL/min — ABNORMAL LOW (ref >60)
Glucose, Bld: 193 mg/dL — ABNORMAL HIGH (ref 70–99)
Potassium: 3.6 mmol/L (ref 3.5–5.1)
Sodium: 137 mmol/L (ref 135–145)

## 2018-07-01 LAB — PROCALCITONIN: Procalcitonin: <0.1 ng/mL

## 2018-07-01 LAB — GLUCOSE, CAPILLARY
GLUCOSE-CAPILLARY: 79 mg/dL (ref 70–99)
Glucose-Capillary: 65 mg/dL — ABNORMAL LOW (ref 70–99)
Glucose-Capillary: 87 mg/dL (ref 70–99)
Glucose-Capillary: 157 mg/dL — ABNORMAL HIGH (ref 70–99)
Glucose-Capillary: 158 mg/dL — ABNORMAL HIGH (ref 70–99)
Glucose-Capillary: 168 mg/dL — ABNORMAL HIGH (ref 70–99)
Glucose-Capillary: 255 mg/dL — ABNORMAL HIGH (ref 70–99)

## 2018-07-01 LAB — CBC
HCT: 30.6 % — ABNORMAL LOW (ref 36.0–46.0)
Hemoglobin: 9.3 g/dL — ABNORMAL LOW (ref 12.0–15.0)
MCH: 28.7 pg (ref 26.0–34.0)
MCHC: 30.4 g/dL (ref 30.0–36.0)
MCV: 94.4 fL (ref 80.0–100.0)
Platelets: 168 10*3/uL (ref 150–400)
RBC: 3.24 MIL/uL — ABNORMAL LOW (ref 3.87–5.11)
RDW: 14.8 % (ref 11.5–15.5)
WBC: 9 10*3/uL (ref 4.0–10.5)
nRBC: 0 % (ref 0.0–0.2)

## 2018-07-01 LAB — HIV ANTIBODY (ROUTINE TESTING W REFLEX): HIV Screen 4th Generation wRfx: NONREACTIVE

## 2018-07-01 MED ORDER — ONDANSETRON HCL 4 MG/2ML IJ SOLN: 4.0000 mg | Freq: Four times a day (QID) | INTRAMUSCULAR | Status: DC | PRN

## 2018-07-01 MED ORDER — SODIUM CHLORIDE 0.9 % IV SOLN
3.0000 g | Freq: Four times a day (QID) | INTRAVENOUS | Status: DC
  Administered 2018-07-01 – 2018-07-03 (×7): 3 g via INTRAVENOUS
  Filled 2018-07-01 (×11): qty 3

## 2018-07-01 NOTE — Progress Notes (Signed)
[facility] [place] [name]    MR#:  [id number]  DATE OF BIRTH:  [birth year]  SUBJECTIVE:  CHIEF COMPLAINT:   Chief Complaint  Patient presents with  . Nausea  . Emesis   Recent admission- came back with pneumonia. SOB and coughing. Have generalized weakness.  REVIEW OF SYSTEMS:  CONSTITUTIONAL: No fever,have fatigue or weakness.  EYES: No blurred or double vision.  EARS, NOSE, AND THROAT: No tinnitus or ear pain.  RESPIRATORY: have cough, shortness of breath,no wheezing or hemoptysis.  CARDIOVASCULAR: No chest pain, orthopnea, edema.  GASTROINTESTINAL: No nausea, vomiting, diarrhea or abdominal pain.  GENITOURINARY: No dysuria, hematuria.  ENDOCRINE: No polyuria, nocturia,  HEMATOLOGY: No anemia, easy bruising or bleeding SKIN: No rash or lesion. MUSCULOSKELETAL: No joint pain or arthritis.   NEUROLOGIC: No tingling, numbness, weakness.  PSYCHIATRY: No anxiety or depression.   ROS  DRUG ALLERGIES:   Allergies  Allergen Reactions  . Ceftriaxone Swelling  . Cefuroxime Hives  . Cephalosporins Swelling and Other (See Comments)    Reaction:  Fainting/dry mouth   . Codeine Nausea And Vomiting  . Epinephrine Other (See Comments)    Reaction:  Fainting   . Morphine And Related Other (See Comments)    Reaction:  GI upset   . Buprenorphine Hcl Nausea And Vomiting  . Ciprofloxacin Itching, Swelling and Rash  . Latex Rash  . Lidocaine Rash  . Procaine Rash    VITALS:  Blood pressure (!) 130/57, pulse 69, temperature 98 F (36.7 C), temperature source Oral, resp. rate 18, height 5\' 7"  (1.702 m), weight 80.6 kg, SpO2 96 %.  PHYSICAL EXAMINATION:  GENERAL:  85 y.o.-year-old patient lying in the bed with no acute distress.  EYES: Pupils equal, round, reactive to light and accommodation. No scleral icterus. Extraocular muscles intact.  HEENT: Head atraumatic, normocephalic. Oropharynx and nasopharynx clear.  NECK:   Supple, no jugular venous distention. No thyroid enlargement, no tenderness.  LUNGS: Normal breath sounds bilaterally, no wheezing, Some crepitation. No use of accessory muscles of respiration.  CARDIOVASCULAR: S1, S2 normal. No murmurs, rubs, or gallops.  ABDOMEN: Soft, nontender, nondistended. Bowel sounds present. No organomegaly or mass.  EXTREMITIES: No pedal edema, cyanosis, or clubbing.  NEUROLOGIC: Cranial nerves II through XII are intact. Muscle strength 3-4/5 in all extremities. Sensation intact. Gait not checked.  PSYCHIATRIC: The patient is alert and oriented x 3.  SKIN: No obvious rash, lesion, or ulcer.   Physical Exam LABORATORY PANEL:   CBC Recent Labs  Lab 07/01/18 0455  WBC 9.0  HGB 9.3*  HCT 30.6*  PLT 168   ------------------------------------------------------------------------------------------------------------------  Chemistries  Recent Labs  Lab 06/29/18 2004 07/01/18 0455  NA 137 137  K 3.7 3.6  CL 98 100  CO2 29 29  GLUCOSE 281* 193*  BUN 41* 36*  CREATININE 1.22* 1.10*  CALCIUM 8.9 8.5*  AST 16  --   ALT 11  --   ALKPHOS 111  --   BILITOT 0.8  --    ------------------------------------------------------------------------------------------------------------------  Cardiac Enzymes No results for input(s): TROPONINI in the last 168 hours. ------------------------------------------------------------------------------------------------------------------  RADIOLOGY:  Dg Chest Portable 1 View  Result Date: 06/29/2018 CLINICAL DATA:  Nausea, vomiting. EXAM: PORTABLE CHEST 1 VIEW COMPARISON:  Radiograph of June 14, 2018. FINDINGS: Stable cardiomegaly. Atherosclerosis of thoracic aorta is noted. Left-sided pacemaker is unchanged in position. No pneumothorax or pleural effusion is noted. No acute pulmonary disease is noted. Bony thorax is  unremarkable. IMPRESSION: No acute cardiopulmonary abnormality seen. Aortic Atherosclerosis (ICD10-I70.0).  Electronically Signed   By: Marijo Conception, M.D.   On: 06/29/2018 21:08   Ct Renal Stone Study  Result Date: 06/29/2018 CLINICAL DATA:  Flank pain EXAM: CT ABDOMEN AND PELVIS WITHOUT CONTRAST TECHNIQUE: Multidetector CT imaging of the abdomen and pelvis was performed following the standard protocol without IV contrast. COMPARISON:  Radiograph 12/11/2017, CT chest 06/01/2018, CT abdomen pelvis 11/19/2009, CT chest 10/06/2014 FINDINGS: Lower chest: Stable bilateral lung base nodules. Patchy foci of consolidation at the right lung base with surrounding ground-glass density suspicious for infectious pneumonia. No pleural effusion. Borderline cardiomegaly. Partially visualized cardiac pacing leads. Hepatobiliary: No focal liver abnormality is seen. No gallstones, gallbladder wall thickening, or biliary dilatation. Pancreas: Unremarkable. No pancreatic ductal dilatation or surrounding inflammatory changes. Spleen: Normal in size without focal abnormality. Adrenals/Urinary Tract: Adrenal glands are normal. No hydronephrosis. Probable cysts within the bilateral kidneys. The bladder is unremarkable Stomach/Bowel: Stomach nonenlarged. No dilated small bowel. No bowel wall thickening. Postsurgical changes at the cecum. Vascular/Lymphatic: Heavily calcified aorta. No aneurysm. No significantly enlarged lymph nodes. Reproductive: Status post hysterectomy. No adnexal masses. Other: Negative for free air or free fluid. Infraumbilical ventral hernia containing mesenteric fat and a portion of the transverse colon. Musculoskeletal: Posterior rods and fixating screws from T11 through S1. Chronic compression deformity at T10 when counting from above. IMPRESSION: 1. No CT evidence for acute intra-abdominal or pelvic abnormality. 2. Patchy foci of consolidation at the right base with surrounding ground-glass density suspicious for pneumonia 3. Extensive postsurgical changes of the thoracolumbar spine 4. Infraumbilical ventral hernia  containing fat and part of the transverse colon. Negative for incarceration or obstruction. Electronically Signed   By: Donavan Foil M.D.   On: 06/29/2018 22:43    ASSESSMENT AND PLAN:   Active Problems:   CAP (community acquired pneumonia)  * Acute HCAP  Recent hospitalization for heart failure 12 days ago   empiric Zosyn/azithromycin/vancomycin-pharmacy to dose, follow-up on cultures   MRSA PCR negative- d/c vanc.  Flu test negative.  * Acute hyperglycemia with chronic diabetes mellitus type 2  Blood sugars greater than 400 over the last several days with associated nausea/emesis, last bowel movement was yesterday  Continue home regiment, sliding scale insulin with Accu-Cheks per routine, Lantus 20 units at bedtime x1 now, 8.7 hemoglobin A1c - poor control control, antiemetics PRN   *Chronic diastolic congestive heart failure without exacerbation  Continue aspirin, Coreg, Lasix, spironolactone  *Chronic pain syndrome Stable on home regiment which will be continued  *Chronic hypoxic respiratory failure Stable On 2 L via nasal cannula at night  *Chronic benign essential hypertension Stable on home regiment which will be continued  * Chronic A fib- s/p ablation and s/p pacemaker placement,   Cont coreg, Xarelto.  * Hx of CAD and stent in LAD   Cont ASA and plavix? As per PMD  ( PA) note last month- she was on ASA+ Plavix and also on xarelto. I will let PMD clarify that with cardiologist.  All the records are reviewed and case discussed with Care Management/Social Workerr. Management plans discussed with the patient, family and they are in agreement.  CODE STATUS: DNR  TOTAL TIME TAKING CARE OF THIS PATIENT: 35 minutes.    POSSIBLE D/C IN 1-2 DAYS, DEPENDING ON CLINICAL CONDITION.   Vaughan Basta M.D on 07/01/2018   Between 7am to 6pm - Pager - [phone]  After 6pm go to [email or web address] - [place]  [facility]  Office   [phone]  CC: Primary care physician; Tracie Harrier, MD  Note: This dictation was prepared with Dragon dictation along with smaller phrase technology. Any transcriptional errors that result from this process are unintentional.

## 2018-07-01 NOTE — Progress Notes (Signed)
Hypoglycemic Event  CBG: 65  Treatment:4 oz orange juice   Symptoms: flushed  Follow-up CBG: Time: 0119 CBG Result:87   Possible Reasons for Event: did not eat much dinner  Comments/MD notified:Willis 0110   Nadara Eaton

## 2018-07-01 NOTE — Care Management (Addendum)
Per MD patient hopes that patient can discharge to home tomorrow.  I have notified Blanchard Mane with [facility] home [facility] [phone]. RNCM will follow. Patient uses nocturnal O2 at home but is needing chronically this visit.  Please provide O2 assessment as patient will need to qualify for continuous supplemental O2. Per patient it is provided through [facility].

## 2018-07-02 LAB — GLUCOSE, CAPILLARY
Glucose-Capillary: 184 mg/dL — ABNORMAL HIGH (ref 70–99)
Glucose-Capillary: 165 mg/dL — ABNORMAL HIGH (ref 70–99)
Glucose-Capillary: 264 mg/dL — ABNORMAL HIGH (ref 70–99)
Glucose-Capillary: 127 mg/dL — ABNORMAL HIGH (ref 70–99)
Glucose-Capillary: 120 mg/dL — ABNORMAL HIGH (ref 70–99)

## 2018-07-02 LAB — LEGIONELLA PNEUMOPHILA SEROGP 1 UR AG: L. pneumophila Serogp 1 Ur Ag: NEGATIVE

## 2018-07-02 LAB — PROCALCITONIN: Procalcitonin: <0.1 ng/mL

## 2018-07-02 NOTE — Progress Notes (Signed)
Walking O2 saturation was performed by PT. During ambulation the pt O2 sat on room air was 93% without any de-saturations.

## 2018-07-02 NOTE — Progress Notes (Signed)
Telemetry Tech notified Nurse that patient had 6 beats of V-tach.Nurse was in the room when notified and patient was lying in bed resting and complained of no pain or discomfort. Notifed hospitalist on call. Orders to continue monitoring patient. Will continue to monitor patient to end of shift.

## 2018-07-02 NOTE — Plan of Care (Signed)

## 2018-07-02 NOTE — Progress Notes (Signed)
Clinical Social Worker (CSW) met with patient and her husband Vicente Serene was at bedside. Patient is still refusing SNF and states that she wants to go home with home health. Patient's husband reported that patient can make the decision about going home and he will support her. RN case manager aware of above.   [employer], LCSW [phone]

## 2018-07-02 NOTE — Progress Notes (Addendum)
[place] [facility] [place] [name]    MR#:  [id number]  DATE OF BIRTH:  [birth year]  SUBJECTIVE:  CHIEF COMPLAINT:   Chief Complaint  Patient presents with  . Nausea  . Emesis   Recent admission- came back with pneumonia. SOB and coughing. Have generalized weakness. Feeling better.  REVIEW OF SYSTEMS:  CONSTITUTIONAL: No fever,have fatigue or weakness.  EYES: No blurred or double vision.  EARS, NOSE, AND THROAT: No tinnitus or ear pain.  RESPIRATORY: have cough, shortness of breath,no wheezing or hemoptysis.  CARDIOVASCULAR: No chest pain, orthopnea, edema.  GASTROINTESTINAL: No nausea, vomiting, diarrhea or abdominal pain.  GENITOURINARY: No dysuria, hematuria.  ENDOCRINE: No polyuria, nocturia,  HEMATOLOGY: No anemia, easy bruising or bleeding SKIN: No rash or lesion. MUSCULOSKELETAL: No joint pain or arthritis.   NEUROLOGIC: No tingling, numbness, weakness.  PSYCHIATRY: No anxiety or depression.   ROS  DRUG ALLERGIES:   Allergies  Allergen Reactions  . Ceftriaxone Swelling    Tolerates piperacillin/tazobactam, PCN G  . Cefuroxime Hives    Tolerates piperacillin/tazobactam, PCN G  . Cephalosporins Swelling and Other (See Comments)    Reaction:  Fainting/dry mouth  Tolerates piperacillin/tazobactam, PCN G  . Codeine Nausea And Vomiting  . Epinephrine Other (See Comments)    Reaction:  Fainting   . Morphine And Related Other (See Comments)    Reaction:  GI upset   . Buprenorphine Hcl Nausea And Vomiting  . Ciprofloxacin Itching, Swelling and Rash  . Latex Rash  . Lidocaine Rash  . Procaine Rash    VITALS:  Blood pressure (!) 128/50, pulse 70, temperature 97.8 F (36.6 C), temperature source Oral, resp. rate 16, height 5\' 7"  (1.702 m), weight 80.6 kg, SpO2 90 %.  PHYSICAL EXAMINATION:  GENERAL:  85 y.o.-year-old patient lying in the bed with no acute distress.  EYES: Pupils equal, round, reactive to light and  accommodation. No scleral icterus. Extraocular muscles intact.  HEENT: Head atraumatic, normocephalic. Oropharynx and nasopharynx clear.  NECK:  Supple, no jugular venous distention. No thyroid enlargement, no tenderness.  LUNGS: Normal breath sounds bilaterally, no wheezing, Some crepitation. No use of accessory muscles of respiration.  CARDIOVASCULAR: S1, S2 normal. No murmurs, rubs, or gallops.  ABDOMEN: Soft, nontender, nondistended. Bowel sounds present. No organomegaly or mass.  EXTREMITIES: No pedal edema, cyanosis, or clubbing.  NEUROLOGIC: Cranial nerves II through XII are intact. Muscle strength 3-4/5 in all extremities. Sensation intact. Gait not checked.  PSYCHIATRIC: The patient is alert and oriented x 3.  SKIN: No obvious rash, lesion, or ulcer.   Physical Exam LABORATORY PANEL:   CBC Recent Labs  Lab 07/01/18 0455  WBC 9.0  HGB 9.3*  HCT 30.6*  PLT 168   ------------------------------------------------------------------------------------------------------------------  Chemistries  Recent Labs  Lab 06/29/18 2004 07/01/18 0455  NA 137 137  K 3.7 3.6  CL 98 100  CO2 29 29  GLUCOSE 281* 193*  BUN 41* 36*  CREATININE 1.22* 1.10*  CALCIUM 8.9 8.5*  AST 16  --   ALT 11  --   ALKPHOS 111  --   BILITOT 0.8  --    ------------------------------------------------------------------------------------------------------------------  Cardiac Enzymes No results for input(s): TROPONINI in the last 168 hours. ------------------------------------------------------------------------------------------------------------------  RADIOLOGY:  No results found.  ASSESSMENT AND PLAN:   Active Problems:   CAP (community acquired pneumonia)  * Acute HCAP  Recent hospitalization for heart failure 12 days ago   empiric Zosyn/azithromycin/vancomycin-pharmacy to dose, follow-up  on cultures   MRSA PCR negative- d/c vanc.  Flu test negative.  Deascalate Abx as pt is  improving.  * Acute hyperglycemia with chronic diabetes mellitus type 2  Blood sugars greater than 400 over the last several days with associated nausea/emesis, last bowel movement was yesterday  Continue home regiment, sliding scale insulin with Accu-Cheks per routine, Lantus 20 units at bedtime x1 now, 8.7 hemoglobin A1c - poor control control, antiemetics PRN   *Chronic diastolic congestive heart failure without exacerbation  Continue aspirin, Coreg, Lasix, spironolactone  *Chronic pain syndrome Stable on home regiment which will be continued  *Chronic hypoxic respiratory failure Stable On 2 L via nasal cannula at night  *Chronic benign essential hypertension Stable on home regiment which will be continued  * Chronic A fib- s/p ablation and s/p pacemaker placement,   Cont coreg, Xarelto.  * Hx of CAD and stent in LAD   Cont ASA and plavix? As per PMD  ( PA) note last month- she was on ASA+ Plavix and also on xarelto. I will let PMD clarify that with cardiologist.  She is improving, PT suggested SNF but pt feels strongly to go home. She have 24 hr personal care taker at home, husband is very hard of hearing and needs her to be there also. She had PT. OT. RN visiting, and said that setting was better than going to rehab as she can get up and move with a walker.  All the records are reviewed and case discussed with Care Management/Social Workerr. Management plans discussed with the patient, family and they are in agreement.  CODE STATUS: DNR  TOTAL TIME TAKING CARE OF THIS PATIENT: 35 minutes.    POSSIBLE D/C IN 1-2 DAYS, DEPENDING ON CLINICAL CONDITION.   Vaughan Basta M.D on 07/02/2018   Between 7am to 6pm - Pager - [phone]  After 6pm go to [email or web address] - password [email or web address] [facility]  Office  [phone]  CC: Primary care physician; Tracie Harrier, MD  Note: This dictation was prepared with Dragon dictation along with smaller  phrase technology. Any transcriptional errors that result from this process are unintentional.

## 2018-07-02 NOTE — Care Management (Signed)
Notified the nurse that we need O2 sat on RA for patient Notified Dr. Anselm Jungling that if the patient needs O2 at home other than PRN will need an order at [facility]

## 2018-07-02 NOTE — Care Management Important Message (Signed)
Important Message  Patient Details  [name] MRN: [id number] Date of Birth: 01-28-1925   Medicare Important Message Given:  Yes    [name] [name] 07/02/2018, 11:51 AM

## 2018-07-02 NOTE — Care Management (Signed)
Patient is on Room air and has walked to the door with PT, The discharge plan is still to go home as patient and husband declines SNF Patient uses O2 at home PRN at night, that will not change Will encourage patient to keep the caregiver 24 hours for a few days per Doctor recommendation

## 2018-07-02 NOTE — Progress Notes (Signed)
Physical Therapy Treatment Patient Details [name] MRN: [id number] DOB: [birth year] Today's Date: 07/02/2018    History of Present Illness Pt is 85 yo female admitted for elevated blood sugar, nausea and emesis, hypoxia, tachycardia, PNA. Recent admit 12 days ago for acute CHF. PMH of L foot wound/infection, pacemaker, skin cx, back pain, COPD, DM, HTN.    PT Comments    Patient on bedside commode with nursing tech at start of session. Pt demonstrated multiple sit<> stand transfers this session, minAx1 for initial standing balance for posterior lean, and heavy use of UE. Pt ambulate ~31ft with RW and CGA, intermittent cues for breathing to address spO2, >92% during ambulation. Pt with fatigue at end of ambulation but with no complaints. The patient would benefit from further skilled PT to continue to address mobility, independence, and safety.       Follow Up Recommendations  SNF     Equipment Recommendations  None recommended by PT    Recommendations for Other Services       Precautions / Restrictions Precautions Precautions: Fall Precaution Comments: monitor O2 sats Required Braces or Orthoses: Other Brace Other Brace: ortho shoes Restrictions Weight Bearing Restrictions: No LLE Weight Bearing: Weight bearing as tolerated Other Position/Activity Restrictions: post op shoe on left LE due to existing wound. Pt also prefers to ambulate with R shoe in place.     Mobility  Bed Mobility               General bed mobility comments: Deferred, pt on bedside commode at start of session  Transfers Overall transfer level: Needs assistance Equipment used: Rolling walker (2 wheeled) Transfers: Sit to/from Stand Sit to Stand: Min assist         General transfer comment: Heavy use of chair arms, minAx1 for initial standing balance.  Ambulation/Gait Ambulation/Gait assistance: Min guard Gait Distance (Feet): 18 Feet Assistive device: Rolling walker (2  wheeled)   Gait velocity: reduced   General Gait Details: trunk flexion, shuffling step, wide base of support   Stairs             Wheelchair Mobility    Modified Rankin (Stroke Patients Only)       Balance Overall balance assessment: Needs assistance Sitting-balance support: Feet supported Sitting balance-Leahy Scale: Fair                                      Cognition Arousal/Alertness: Awake/alert Behavior During Therapy: WFL for tasks assessed/performed Overall Cognitive Status: Within Functional Limits for tasks assessed                                        Exercises      General Comments        Pertinent Vitals/Pain Pain Assessment: No/denies pain    Home Living                      Prior Function            PT Goals (current goals can now be found in the care plan section) Progress towards PT goals: Progressing toward goals    Frequency    Min 2X/week      PT Plan Current plan remains appropriate    Co-evaluation  AM-PAC PT "6 Clicks" Mobility   Outcome Measure  Help needed turning from your back to your side while in a flat bed without using bedrails?: A Little Help needed moving from lying on your back to sitting on the side of a flat bed without using bedrails?: A Little Help needed moving to and from a bed to a chair (including a wheelchair)?: A Little Help needed standing up from a chair using your arms (e.g., wheelchair or bedside chair)?: A Little Help needed to walk in hospital room?: A Little Help needed climbing 3-5 steps with a railing? : Total 6 Click Score: 16    End of Session Equipment Utilized During Treatment: Gait belt Activity Tolerance: Patient limited by fatigue Patient left: in chair;with nursing/sitter in room;with call bell/phone within reach;with family/visitor present Nurse Communication: Mobility status PT Visit Diagnosis: Unsteadiness on  feet (R26.81);History of falling (Z91.81);Adult, failure to thrive (R62.7);Muscle weakness (generalized) (M62.81)     Time: 1281-1886 PT Time Calculation (min) (ACUTE ONLY): 20 min  Charges:  $Therapeutic Activity: 8-22 mins                     Lieutenant Diego PT, DPT 12:43 PM,07/02/18 [phone]

## 2018-07-03 LAB — GLUCOSE, CAPILLARY
Glucose-Capillary: 110 mg/dL — ABNORMAL HIGH (ref 70–99)
Glucose-Capillary: 128 mg/dL — ABNORMAL HIGH (ref 70–99)

## 2018-07-03 MED ORDER — AMOXICILLIN-POT CLAVULANATE 875-125 MG PO TABS: 1.0000 | ORAL_TABLET | Freq: Two times a day (BID) | ORAL | 0 refills | Status: DC

## 2018-07-03 NOTE — Progress Notes (Signed)
Discharge instructions reviewed with patient. IV removed without complications. Prescription given to patient.

## 2018-07-03 NOTE — Care Management (Signed)
RNCM spoke with patient. She denies DME needs. She has PRN O2 at home through [facility]- she only wears it at night.  She states her caretaker will provide transportation today to home.  I have notified Cassie with [facility] of patient discharge.  No other RNCM needs.

## 2018-07-03 NOTE — Progress Notes (Signed)
[place] at [place] [name]    MR#:  [id number]  DATE OF BIRTH:  Dec 27, 1924  SUBJECTIVE:  CHIEF COMPLAINT:   Chief Complaint  Patient presents with  . Nausea  . Emesis   Recent admission- came back with pneumonia. SOB and coughing. Have generalized weakness. Feeling better. Was able to walk up to the door of the room. Husband is still concern of her being weak.  REVIEW OF SYSTEMS:  CONSTITUTIONAL: No fever,have fatigue or weakness.  EYES: No blurred or double vision.  EARS, NOSE, AND THROAT: No tinnitus or ear pain.  RESPIRATORY: have cough, shortness of breath,no wheezing or hemoptysis.  CARDIOVASCULAR: No chest pain, orthopnea, edema.  GASTROINTESTINAL: No nausea, vomiting, diarrhea or abdominal pain.  GENITOURINARY: No dysuria, hematuria.  ENDOCRINE: No polyuria, nocturia,  HEMATOLOGY: No anemia, easy bruising or bleeding SKIN: No rash or lesion. MUSCULOSKELETAL: No joint pain or arthritis.   NEUROLOGIC: No tingling, numbness, weakness.  PSYCHIATRY: No anxiety or depression.   ROS  DRUG ALLERGIES:   Allergies  Allergen Reactions  . Ceftriaxone Swelling    Tolerates piperacillin/tazobactam, PCN G  . Cefuroxime Hives    Tolerates piperacillin/tazobactam, PCN G  . Cephalosporins Swelling and Other (See Comments)    Reaction:  Fainting/dry mouth  Tolerates piperacillin/tazobactam, PCN G  . Codeine Nausea And Vomiting  . Epinephrine Other (See Comments)    Reaction:  Fainting   . Morphine And Related Other (See Comments)    Reaction:  GI upset   . Buprenorphine Hcl Nausea And Vomiting  . Ciprofloxacin Itching, Swelling and Rash  . Latex Rash  . Lidocaine Rash  . Procaine Rash    VITALS:  Blood pressure (!) 134/51, pulse 73, temperature 98.2 F (36.8 C), temperature source Oral, resp. rate 19, height 5\' 7"  (1.702 m), weight 80.6 kg, SpO2 93 %.  PHYSICAL EXAMINATION:  GENERAL:  85 y.o.-year-old patient lying  in the bed with no acute distress.  EYES: Pupils equal, round, reactive to light and accommodation. No scleral icterus. Extraocular muscles intact.  HEENT: Head atraumatic, normocephalic. Oropharynx and nasopharynx clear.  NECK:  Supple, no jugular venous distention. No thyroid enlargement, no tenderness.  LUNGS: Normal breath sounds bilaterally, no wheezing, Some crepitation. No use of accessory muscles of respiration.  CARDIOVASCULAR: S1, S2 normal. No murmurs, rubs, or gallops.  ABDOMEN: Soft, nontender, nondistended. Bowel sounds present. No organomegaly or mass.  EXTREMITIES: No pedal edema, cyanosis, or clubbing.  NEUROLOGIC: Cranial nerves II through XII are intact. Muscle strength 3-4/5 in all extremities. Sensation intact. Gait not checked.  PSYCHIATRIC: The patient is alert and oriented x 3.  SKIN: No obvious rash, lesion, or ulcer.   Physical Exam LABORATORY PANEL:   CBC Recent Labs  Lab 07/01/18 0455  WBC 9.0  HGB 9.3*  HCT 30.6*  PLT 168   ------------------------------------------------------------------------------------------------------------------  Chemistries  Recent Labs  Lab 06/29/18 2004 07/01/18 0455  NA 137 137  K 3.7 3.6  CL 98 100  CO2 29 29  GLUCOSE 281* 193*  BUN 41* 36*  CREATININE 1.22* 1.10*  CALCIUM 8.9 8.5*  AST 16  --   ALT 11  --   ALKPHOS 111  --   BILITOT 0.8  --    ------------------------------------------------------------------------------------------------------------------  Cardiac Enzymes No results for input(s): TROPONINI in the last 168 hours. ------------------------------------------------------------------------------------------------------------------  RADIOLOGY:  No results found.  ASSESSMENT AND PLAN:   Active Problems:   CAP (community acquired pneumonia)  *  Acute HCAP  Recent hospitalization for heart failure 12 days ago   empiric Zosyn/azithromycin/vancomycin-pharmacy to dose, follow-up on cultures    MRSA PCR negative- d/c vanc.  Flu test negative.  Deascalate Abx as pt is improving.  likely will be able to wean to room air.  * Acute hyperglycemia with chronic diabetes mellitus type 2  Blood sugars greater than 400 over the last several days with associated nausea/emesis, last bowel movement was yesterday  Continue home regiment, sliding scale insulin with Accu-Cheks per routine, Lantus 20 units at bedtime x1 now, 8.7 hemoglobin A1c - poor control control, antiemetics PRN   *Chronic diastolic congestive heart failure without exacerbation  Continue aspirin, Coreg, Lasix, spironolactone  *Chronic pain syndrome Stable on home regiment which will be continued  *Chronic hypoxic respiratory failure Stable On 2 L via nasal cannula at night  *Chronic benign essential hypertension Stable on home regiment which will be continued  * Chronic A fib- s/p ablation and s/p pacemaker placement,   Cont coreg, Xarelto.  * Hx of CAD and stent in LAD   Cont ASA and plavix? As per PMD  ( PA) note last month- she was on ASA+ Plavix and also on xarelto. I will let PMD clarify that with cardiologist.  She is improving, PT suggested SNF but pt feels strongly to go home. She have personal care taker at home, husband is very hard of hearing and needs her to be there also. She had PT. OT. RN visiting, and said that setting was better than going to rehab as she can get up and move with a walker. Husband also want her to come home, but she is still very weak and not near baseline as per him- so requesting to stay in hospital one more day.  All the records are reviewed and case discussed with Care Management/Social Workerr. Management plans discussed with the patient, family and they are in agreement.  CODE STATUS: DNR  TOTAL TIME TAKING CARE OF THIS PATIENT: 35 minutes.    POSSIBLE D/C IN 1-2 DAYS, DEPENDING ON CLINICAL CONDITION.   Vaughan Basta M.D on 07/03/2018   Between 7am to 6pm -  Pager - [phone]  After 6pm go to [email or web address] - password [email or web address] [facility]  Office  [phone]  CC: Primary care physician; Tracie Harrier, MD  Note: This dictation was prepared with Dragon dictation along with smaller phrase technology. Any transcriptional errors that result from this process are unintentional.

## 2018-07-05 ENCOUNTER — Other Ambulatory Visit: Payer: Self-pay

## 2018-07-05 ENCOUNTER — Emergency Department: Payer: Medicare Other

## 2018-07-05 ENCOUNTER — Encounter: Payer: Self-pay | Admitting: Emergency Medicine

## 2018-07-05 ENCOUNTER — Inpatient Hospital Stay
Admission: EM | Admit: 2018-07-05 | Discharge: 2018-07-09 | DRG: 291 | Disposition: A | Discharge status: Skilled Nursing Facility | Payer: Medicare Other | Attending: Internal Medicine | Admitting: Internal Medicine

## 2018-07-05 DIAGNOSIS — Z66 Do not resuscitate: Secondary | ICD-10-CM | POA: Diagnosis present

## 2018-07-05 DIAGNOSIS — J449 Chronic obstructive pulmonary disease, unspecified: Secondary | ICD-10-CM | POA: Diagnosis present

## 2018-07-05 DIAGNOSIS — J101 Influenza due to other identified influenza virus with other respiratory manifestations: Secondary | ICD-10-CM | POA: Diagnosis present

## 2018-07-05 DIAGNOSIS — Z7902 Long term (current) use of antithrombotics/antiplatelets: Secondary | ICD-10-CM

## 2018-07-05 DIAGNOSIS — J9601 Acute respiratory failure with hypoxia: Secondary | ICD-10-CM | POA: Diagnosis present

## 2018-07-05 DIAGNOSIS — Z7901 Long term (current) use of anticoagulants: Secondary | ICD-10-CM

## 2018-07-05 DIAGNOSIS — Z7984 Long term (current) use of oral hypoglycemic drugs: Secondary | ICD-10-CM | POA: Diagnosis not present

## 2018-07-05 DIAGNOSIS — Z8249 Family history of ischemic heart disease and other diseases of the circulatory system: Secondary | ICD-10-CM | POA: Diagnosis not present

## 2018-07-05 DIAGNOSIS — G8929 Other chronic pain: Secondary | ICD-10-CM | POA: Diagnosis present

## 2018-07-05 DIAGNOSIS — I5033 Acute on chronic diastolic (congestive) heart failure: Secondary | ICD-10-CM | POA: Diagnosis present

## 2018-07-05 DIAGNOSIS — Z79899 Other long term (current) drug therapy: Secondary | ICD-10-CM | POA: Diagnosis not present

## 2018-07-05 DIAGNOSIS — Z90710 Acquired absence of both cervix and uterus: Secondary | ICD-10-CM | POA: Diagnosis not present

## 2018-07-05 DIAGNOSIS — Z515 Encounter for palliative care: Secondary | ICD-10-CM

## 2018-07-05 DIAGNOSIS — E119 Type 2 diabetes mellitus without complications: Secondary | ICD-10-CM | POA: Diagnosis present

## 2018-07-05 DIAGNOSIS — Z7189 Other specified counseling: Secondary | ICD-10-CM

## 2018-07-05 DIAGNOSIS — J440 Chronic obstructive pulmonary disease with acute lower respiratory infection: Secondary | ICD-10-CM | POA: Diagnosis present

## 2018-07-05 DIAGNOSIS — Z91040 Latex allergy status: Secondary | ICD-10-CM

## 2018-07-05 DIAGNOSIS — I2510 Atherosclerotic heart disease of native coronary artery without angina pectoris: Secondary | ICD-10-CM | POA: Diagnosis present

## 2018-07-05 DIAGNOSIS — J1000 Influenza due to other identified influenza virus with unspecified type of pneumonia: Secondary | ICD-10-CM | POA: Diagnosis present

## 2018-07-05 DIAGNOSIS — Z955 Presence of coronary angioplasty implant and graft: Secondary | ICD-10-CM | POA: Diagnosis not present

## 2018-07-05 DIAGNOSIS — Z950 Presence of cardiac pacemaker: Secondary | ICD-10-CM

## 2018-07-05 DIAGNOSIS — R0602 Shortness of breath: Secondary | ICD-10-CM | POA: Diagnosis not present

## 2018-07-05 DIAGNOSIS — R0902 Hypoxemia: Secondary | ICD-10-CM

## 2018-07-05 DIAGNOSIS — F418 Other specified anxiety disorders: Secondary | ICD-10-CM | POA: Diagnosis present

## 2018-07-05 DIAGNOSIS — Z888 Allergy status to other drugs, medicaments and biological substances status: Secondary | ICD-10-CM | POA: Diagnosis not present

## 2018-07-05 DIAGNOSIS — I4891 Unspecified atrial fibrillation: Secondary | ICD-10-CM | POA: Diagnosis present

## 2018-07-05 DIAGNOSIS — Z823 Family history of stroke: Secondary | ICD-10-CM | POA: Diagnosis not present

## 2018-07-05 DIAGNOSIS — M545 Low back pain: Secondary | ICD-10-CM | POA: Diagnosis present

## 2018-07-05 DIAGNOSIS — Z885 Allergy status to narcotic agent status: Secondary | ICD-10-CM

## 2018-07-05 DIAGNOSIS — I110 Hypertensive heart disease with heart failure: Secondary | ICD-10-CM | POA: Diagnosis not present

## 2018-07-05 DIAGNOSIS — Z833 Family history of diabetes mellitus: Secondary | ICD-10-CM

## 2018-07-05 DIAGNOSIS — Z881 Allergy status to other antibiotic agents status: Secondary | ICD-10-CM

## 2018-07-05 DIAGNOSIS — Z7982 Long term (current) use of aspirin: Secondary | ICD-10-CM | POA: Diagnosis not present

## 2018-07-05 HISTORY — DX: Presence of cardiac pacemaker: Z95.0

## 2018-07-05 LAB — CBC WITH DIFFERENTIAL/PLATELET
Abs Immature Granulocytes: 0.02 10*3/uL (ref 0.00–0.07)
BASOS PCT: 1 %
Basophils Absolute: 0.1 10*3/uL (ref 0.0–0.1)
Eosinophils Absolute: 0.1 10*3/uL (ref 0.0–0.5)
Eosinophils Relative: 2 %
HCT: 32.1 % — ABNORMAL LOW (ref 36.0–46.0)
Hemoglobin: 9.9 g/dL — ABNORMAL LOW (ref 12.0–15.0)
Immature Granulocytes: 0 %
Lymphocytes Relative: 5 %
Lymphs Abs: 0.4 10*3/uL — ABNORMAL LOW (ref 0.7–4.0)
MCH: 29.6 pg (ref 26.0–34.0)
MCHC: 30.8 g/dL (ref 30.0–36.0)
MCV: 95.8 fL (ref 80.0–100.0)
MONOS PCT: 8 %
Monocytes Absolute: 0.6 10*3/uL (ref 0.1–1.0)
Neutro Abs: 6.4 10*3/uL (ref 1.7–7.7)
Neutrophils Relative %: 84 %
Platelets: 172 10*3/uL (ref 150–400)
RBC: 3.35 MIL/uL — ABNORMAL LOW (ref 3.87–5.11)
RDW: 15 % (ref 11.5–15.5)
WBC: 7.6 10*3/uL (ref 4.0–10.5)
nRBC: 0 % (ref 0.0–0.2)

## 2018-07-05 LAB — COMPREHENSIVE METABOLIC PANEL
ALT: 11 U/L (ref 0–44)
AST: 14 U/L — ABNORMAL LOW (ref 15–41)
Albumin: 3.4 g/dL — ABNORMAL LOW (ref 3.5–5.0)
Alkaline Phosphatase: 95 U/L (ref 38–126)
Anion gap: 10 (ref 5–15)
BUN: 31 mg/dL — AB (ref 8–23)
CO2: 33 mmol/L — ABNORMAL HIGH (ref 22–32)
Calcium: 8.9 mg/dL (ref 8.9–10.3)
Chloride: 96 mmol/L — ABNORMAL LOW (ref 98–111)
Creatinine, Ser: 1.05 mg/dL — ABNORMAL HIGH (ref 0.44–1.00)
GFR calc Af Amer: 54 mL/min — ABNORMAL LOW (ref >60)
GFR calc non Af Amer: 46 mL/min — ABNORMAL LOW (ref >60)
Glucose, Bld: 221 mg/dL — ABNORMAL HIGH (ref 70–99)
POTASSIUM: 3.9 mmol/L (ref 3.5–5.1)
Sodium: 139 mmol/L (ref 135–145)
Total Bilirubin: 0.9 mg/dL (ref 0.3–1.2)
Total Protein: 7.1 g/dL (ref 6.5–8.1)

## 2018-07-05 LAB — CULTURE, BLOOD (ROUTINE X 2)
Culture: NO GROWTH
Culture: NO GROWTH
Special Requests: ADEQUATE

## 2018-07-05 LAB — URINALYSIS, COMPLETE (UACMP) WITH MICROSCOPIC
Bacteria, UA: NONE SEEN
Bilirubin Urine: NEGATIVE
Glucose, UA: NEGATIVE mg/dL
HGB URINE DIPSTICK: NEGATIVE
Ketones, ur: NEGATIVE mg/dL
Leukocytes, UA: NEGATIVE
Nitrite: NEGATIVE
Protein, ur: NEGATIVE mg/dL
Specific Gravity, Urine: 1.012 (ref 1.005–1.030)
WBC, UA: NONE SEEN WBC/hpf (ref 0–5)
pH: 7 (ref 5.0–8.0)

## 2018-07-05 LAB — INFLUENZA PANEL BY PCR (TYPE A & B)
Influenza A By PCR: POSITIVE — AB
Influenza B By PCR: NEGATIVE

## 2018-07-05 LAB — GLUCOSE, CAPILLARY
GLUCOSE-CAPILLARY: 141 mg/dL — AB (ref 70–99)
Glucose-Capillary: 111 mg/dL — ABNORMAL HIGH (ref 70–99)

## 2018-07-05 LAB — TROPONIN I: Troponin I: 0.03 ng/mL (ref <0.03)

## 2018-07-05 LAB — BRAIN NATRIURETIC PEPTIDE: B Natriuretic Peptide: 456 pg/mL — ABNORMAL HIGH (ref 0.0–100.0)

## 2018-07-05 LAB — LACTIC ACID, PLASMA: Lactic Acid, Venous: 1.1 mmol/L (ref 0.5–1.9)

## 2018-07-05 MED ORDER — ADULT MULTIVITAMIN W/MINERALS CH
1.0000 | ORAL_TABLET | Freq: Every day | ORAL | Status: DC
  Administered 2018-07-06 – 2018-07-09 (×4): 1 via ORAL
  Filled 2018-07-05 (×4): qty 1

## 2018-07-05 MED ORDER — INSULIN ASPART 100 UNIT/ML ~~LOC~~ SOLN
0.0000 [IU] | Freq: Three times a day (TID) | SUBCUTANEOUS | Status: DC
  Administered 2018-07-05 – 2018-07-06 (×3): 1 [IU] via SUBCUTANEOUS
  Administered 2018-07-07: 3 [IU] via SUBCUTANEOUS
  Administered 2018-07-07: 2 [IU] via SUBCUTANEOUS
  Administered 2018-07-08: 3 [IU] via SUBCUTANEOUS
  Administered 2018-07-08: 2 [IU] via SUBCUTANEOUS
  Administered 2018-07-09: 1 [IU] via SUBCUTANEOUS
  Administered 2018-07-09: 5 [IU] via SUBCUTANEOUS
  Filled 2018-07-05 (×9): qty 1

## 2018-07-05 MED ORDER — ASPIRIN EC 81 MG PO TBEC
81.0000 mg | DELAYED_RELEASE_TABLET | Freq: Every day | ORAL | Status: DC
  Administered 2018-07-06 – 2018-07-09 (×4): 81 mg via ORAL
  Filled 2018-07-05 (×5): qty 1

## 2018-07-05 MED ORDER — OXYCODONE HCL 5 MG PO TABS
5.0000 mg | ORAL_TABLET | Freq: Four times a day (QID) | ORAL | Status: DC | PRN
  Administered 2018-07-05 – 2018-07-08 (×2): 10 mg via ORAL
  Filled 2018-07-05 (×3): qty 2

## 2018-07-05 MED ORDER — SODIUM CHLORIDE 0.9 % IV BOLUS
500.0000 mL | Freq: Once | INTRAVENOUS | Status: AC
  Administered 2018-07-05: 500 mL via INTRAVENOUS

## 2018-07-05 MED ORDER — AMOXICILLIN-POT CLAVULANATE 875-125 MG PO TABS
1.0000 | ORAL_TABLET | Freq: Two times a day (BID) | ORAL | Status: DC
  Administered 2018-07-05 – 2018-07-06 (×3): 1 via ORAL
  Filled 2018-07-05 (×4): qty 1

## 2018-07-05 MED ORDER — RIVAROXABAN 15 MG PO TABS
15.0000 mg | ORAL_TABLET | Freq: Every day | ORAL | Status: DC
  Administered 2018-07-05 – 2018-07-08 (×4): 15 mg via ORAL
  Filled 2018-07-05 (×5): qty 1

## 2018-07-05 MED ORDER — MELATONIN 5 MG PO TABS
2.5000 mg | ORAL_TABLET | Freq: Every day | ORAL | Status: DC
  Administered 2018-07-06 – 2018-07-08 (×3): 2.5 mg via ORAL
  Filled 2018-07-05 (×5): qty 0.5

## 2018-07-05 MED ORDER — VITAMIN B-12 1000 MCG PO TABS
1000.0000 ug | ORAL_TABLET | Freq: Every day | ORAL | Status: DC
  Administered 2018-07-06 – 2018-07-09 (×4): 1000 ug via ORAL
  Filled 2018-07-05 (×4): qty 1

## 2018-07-05 MED ORDER — FLORANEX PO PACK
1.0000 g | PACK | Freq: Every day | ORAL | Status: DC
  Administered 2018-07-06 – 2018-07-07 (×2): 1 g via ORAL
  Filled 2018-07-05 (×6): qty 1

## 2018-07-05 MED ORDER — ESCITALOPRAM OXALATE 10 MG PO TABS
10.0000 mg | ORAL_TABLET | Freq: Every day | ORAL | Status: DC
  Administered 2018-07-06 – 2018-07-09 (×4): 10 mg via ORAL
  Filled 2018-07-05 (×5): qty 1

## 2018-07-05 MED ORDER — ONDANSETRON HCL 4 MG PO TABS: 4.0000 mg | ORAL_TABLET | Freq: Four times a day (QID) | ORAL | Status: DC | PRN

## 2018-07-05 MED ORDER — CLOPIDOGREL BISULFATE 75 MG PO TABS
75.0000 mg | ORAL_TABLET | Freq: Every day | ORAL | Status: DC
  Administered 2018-07-06 – 2018-07-09 (×4): 75 mg via ORAL
  Filled 2018-07-05 (×4): qty 1

## 2018-07-05 MED ORDER — LINAGLIPTIN 5 MG PO TABS
5.0000 mg | ORAL_TABLET | Freq: Every day | ORAL | Status: DC
  Administered 2018-07-06: 5 mg via ORAL
  Filled 2018-07-05 (×2): qty 1

## 2018-07-05 MED ORDER — BUPROPION HCL ER (XL) 150 MG PO TB24
150.0000 mg | ORAL_TABLET | Freq: Every day | ORAL | Status: DC
  Administered 2018-07-06 – 2018-07-09 (×4): 150 mg via ORAL
  Filled 2018-07-05 (×5): qty 1

## 2018-07-05 MED ORDER — OSELTAMIVIR PHOSPHATE 30 MG PO CAPS
30.0000 mg | ORAL_CAPSULE | Freq: Two times a day (BID) | ORAL | Status: DC
  Administered 2018-07-05 – 2018-07-09 (×8): 30 mg via ORAL
  Filled 2018-07-05 (×9): qty 1

## 2018-07-05 MED ORDER — VITAMIN D 25 MCG (1000 UNIT) PO TABS
1000.0000 [IU] | ORAL_TABLET | Freq: Every day | ORAL | Status: DC
  Administered 2018-07-06 – 2018-07-09 (×4): 1000 [IU] via ORAL
  Filled 2018-07-05 (×4): qty 1

## 2018-07-05 MED ORDER — ROPINIROLE HCL 1 MG PO TABS
0.5000 mg | ORAL_TABLET | Freq: Two times a day (BID) | ORAL | Status: DC
  Administered 2018-07-05 – 2018-07-09 (×8): 0.5 mg via ORAL
  Filled 2018-07-05 (×9): qty 1

## 2018-07-05 MED ORDER — METFORMIN HCL 500 MG PO TABS
500.0000 mg | ORAL_TABLET | Freq: Every day | ORAL | Status: DC
  Administered 2018-07-06: 500 mg via ORAL
  Filled 2018-07-05: qty 1

## 2018-07-05 MED ORDER — CARVEDILOL 3.125 MG PO TABS
6.2500 mg | ORAL_TABLET | Freq: Two times a day (BID) | ORAL | Status: DC
  Administered 2018-07-05 – 2018-07-09 (×8): 6.25 mg via ORAL
  Filled 2018-07-05 (×7): qty 2

## 2018-07-05 MED ORDER — OSELTAMIVIR PHOSPHATE 75 MG PO CAPS
75.0000 mg | ORAL_CAPSULE | Freq: Once | ORAL | Status: AC
  Administered 2018-07-05: 75 mg via ORAL
  Filled 2018-07-05: qty 1

## 2018-07-05 MED ORDER — INSULIN ASPART 100 UNIT/ML ~~LOC~~ SOLN
0.0000 [IU] | Freq: Every day | SUBCUTANEOUS | Status: DC
  Administered 2018-07-06 – 2018-07-07 (×2): 2 [IU] via SUBCUTANEOUS
  Filled 2018-07-05 (×2): qty 1

## 2018-07-05 MED ORDER — FUROSEMIDE 10 MG/ML IJ SOLN
40.0000 mg | Freq: Once | INTRAMUSCULAR | Status: AC
  Administered 2018-07-05: 40 mg via INTRAVENOUS
  Filled 2018-07-05: qty 4

## 2018-07-05 MED ORDER — VITAMIN C 500 MG PO TABS
500.0000 mg | ORAL_TABLET | Freq: Every day | ORAL | Status: DC
  Administered 2018-07-06 – 2018-07-09 (×4): 500 mg via ORAL
  Filled 2018-07-05 (×4): qty 1

## 2018-07-05 MED ORDER — MAGNESIUM OXIDE 400 (241.3 MG) MG PO TABS
400.0000 mg | ORAL_TABLET | Freq: Every day | ORAL | Status: DC
  Administered 2018-07-06 – 2018-07-09 (×4): 400 mg via ORAL
  Filled 2018-07-05 (×4): qty 1

## 2018-07-05 MED ORDER — FUROSEMIDE 40 MG PO TABS
40.0000 mg | ORAL_TABLET | Freq: Two times a day (BID) | ORAL | Status: DC
  Administered 2018-07-05 – 2018-07-09 (×8): 40 mg via ORAL
  Filled 2018-07-05 (×8): qty 1

## 2018-07-05 MED ORDER — FERROUS SULFATE 325 (65 FE) MG PO TABS
325.0000 mg | ORAL_TABLET | Freq: Every day | ORAL | Status: DC
  Administered 2018-07-06 – 2018-07-09 (×4): 325 mg via ORAL
  Filled 2018-07-05 (×4): qty 1

## 2018-07-05 MED ORDER — SPIRONOLACTONE 25 MG PO TABS
12.5000 mg | ORAL_TABLET | Freq: Every day | ORAL | Status: DC
  Administered 2018-07-06 – 2018-07-09 (×4): 12.5 mg via ORAL
  Filled 2018-07-05: qty 0.5
  Filled 2018-07-05: qty 1
  Filled 2018-07-05: qty 0.5
  Filled 2018-07-05: qty 1
  Filled 2018-07-05: qty 0.5
  Filled 2018-07-05 (×2): qty 1
  Filled 2018-07-05 (×2): qty 0.5

## 2018-07-05 MED ORDER — PANTOPRAZOLE SODIUM 40 MG PO TBEC
40.0000 mg | DELAYED_RELEASE_TABLET | Freq: Every day | ORAL | Status: DC
  Administered 2018-07-06 – 2018-07-09 (×4): 40 mg via ORAL
  Filled 2018-07-05 (×5): qty 1

## 2018-07-05 MED ORDER — ONDANSETRON HCL 4 MG/2ML IJ SOLN: 4.0000 mg | Freq: Four times a day (QID) | INTRAMUSCULAR | Status: DC | PRN

## 2018-07-05 MED ORDER — DOCUSATE SODIUM 100 MG PO CAPS
100.0000 mg | ORAL_CAPSULE | Freq: Two times a day (BID) | ORAL | Status: DC
  Administered 2018-07-05 – 2018-07-09 (×8): 100 mg via ORAL
  Filled 2018-07-05 (×8): qty 1

## 2018-07-05 MED ORDER — ACETAMINOPHEN 325 MG PO TABS
650.0000 mg | ORAL_TABLET | Freq: Four times a day (QID) | ORAL | Status: DC | PRN
  Administered 2018-07-07: 650 mg via ORAL
  Filled 2018-07-05: qty 2

## 2018-07-05 MED ORDER — GABAPENTIN 100 MG PO CAPS
100.0000 mg | ORAL_CAPSULE | Freq: Every day | ORAL | Status: DC
  Administered 2018-07-05: 200 mg via ORAL
  Administered 2018-07-06 – 2018-07-07 (×2): 100 mg via ORAL
  Administered 2018-07-08: 200 mg via ORAL
  Filled 2018-07-05: qty 1
  Filled 2018-07-05: qty 2
  Filled 2018-07-05: qty 1
  Filled 2018-07-05: qty 2

## 2018-07-05 NOTE — ED Provider Notes (Signed)
[facility] Emergency Department Provider Note  ____________________________________________   I have reviewed the triage vital signs and the nursing notes. Where available I have reviewed prior notes and, if possible and indicated, outside hospital notes.    HISTORY  Chief Complaint Weakness and Shortness of Breath    HPI [name] is a 85 y.o. female with a history of CHF chronic back pain COPD diabetes mellitus hypertension, presents today complaining of feeling weak.  Patient was discharged on the 10th of this month for a pneumonia admission.  She is still on amoxicillin.  Patient is not on home oxygen.  She does have a home health nurse.  It was recommended to her according to EMS and patient that she go to rehab however, she declined to do so.  However she is too weak to do anything at her house cannot get around home health nurses cannot sufficiently help her and she is short of breath still from her pneumonia, she feels that she is not safe at home and she presents for this reason.  She denies any fever, she does not seem to have any significant worsening in her respiratory status.  She states that she has had no nausea or vomiting or diarrhea.  She does not believe she is had a fever.  Oxygen saturation for EMS was 89%. She denies any focal weakness or fall     Past Medical History:  Diagnosis Date  . Cancer ([place])    skin ca  . CHF (congestive heart failure) ([place])   . Chronic back pain   . COPD (chronic obstructive pulmonary disease) ([place])   . Diabetes mellitus without complication ([place])   . Hypertension   . Poor perfusion of leg     Patient Active Problem List   Diagnosis Date Noted  . CHF exacerbation ([place]) 06/14/2018  . Cellulitis of left lower extremity 06/02/2018  . ARF (acute renal failure) ([place]) 05/17/2018  . Pneumonia 12/11/2017  . CAP (community acquired pneumonia) 04/14/2017  . COPD (chronic obstructive pulmonary disease)  ([place]) 12/07/2016  . UTI (urinary tract infection) 12/07/2016  . Ventral hernia without obstruction or gangrene   . Hyponatremia 09/07/2015  . Acute respiratory failure ([place]) 09/01/2015  . Atrial fibrillation ([place]) 04/10/2015  . Essential hypertension 04/10/2015  . Diabetes ([place]) 04/10/2015  . Acute on chronic combined systolic and diastolic CHF (congestive heart failure) ([place]) 03/21/2015  . Pulmonary nodule 11/10/2014    Past Surgical History:  Procedure Laterality Date  . ABDOMINAL HYSTERECTOMY    . BACK SURGERY     x3  . CORONARY STENT PLACEMENT     unknown location per pt  . FOOT SURGERY    . LEG SURGERY    . PACEMAKER PLACEMENT    . TONSILLECTOMY      Prior to Admission medications   Medication Sig Start Date End Date Taking? Authorizing Provider  acetaminophen (TYLENOL) 325 MG tablet Take 2 tablets (650 mg total) by mouth every 6 (six) hours as needed for mild pain (or Fever >/= 101). Patient not taking: Reported on 06/02/2018 04/18/17   Nicholes Mango, MD  amoxicillin-clavulanate (AUGMENTIN) 875-125 MG tablet Take 1 tablet by mouth 2 (two) times daily for 3 days. 07/03/18 07/06/18  Vaughan Basta, MD  aspirin EC 81 MG tablet Take 81 mg by mouth daily.    [provider]  baclofen (LIORESAL) 10 MG tablet Take 10 mg by mouth 3 (three) times daily as needed.     [provider]  buPROPion (WELLBUTRIN XL) 150 MG 24 hr tablet Take 1 tablet by mouth daily. 11/28/17   [provider]  carvedilol (COREG) 6.25 MG tablet Take 6.25 mg by mouth 2 (two) times daily with a meal.     [provider]  cholecalciferol (VITAMIN D) 1000 units tablet Take 1,000 Units by mouth daily.    [provider]  clopidogrel (PLAVIX) 75 MG tablet Take 75 mg by mouth daily.    [provider]  diclofenac sodium (VOLTAREN) 1 % GEL Apply 2 g topically 4 (four) times daily.    [provider]  docusate sodium (COLACE) 100 MG capsule Take 100 mg  by mouth 2 (two) times daily.     [provider]  escitalopram (LEXAPRO) 10 MG tablet Take 10 mg by mouth daily.    [provider]  ferrous sulfate 325 (65 FE) MG tablet Take 325 mg by mouth daily with breakfast.    [provider]  furosemide (LASIX) 40 MG tablet Take 1 tablet (40 mg total) by mouth 2 (two) times daily for 10 days. Take lasix  40 mg p.o. twice daily for 2 days then can resume home dose.  Patient told me that she takes Lasix 80 mg alternating with 40 mg. 06/16/18 06/26/18  Epifanio Lesches, MD  gabapentin (NEURONTIN) 100 MG capsule Take 100-200 mg by mouth at bedtime.    [provider]  glimepiride (AMARYL) 4 MG tablet Take 4 mg by mouth 2 (two) times daily. 07/01/17   [provider]  lactobacillus (FLORANEX/LACTINEX) PACK Take 1 g by mouth daily.    [provider]  magnesium oxide (MAG-OX) 400 MG tablet Take 400 mg by mouth daily.    [provider]  Melatonin 1 MG TABS Take 1 mg by mouth at bedtime.    [provider]  metFORMIN (GLUCOPHAGE) 500 MG tablet Take 1 tablet by mouth daily. 12/10/17   [provider]  Multiple Vitamin (MULTIVITAMIN) tablet Take 1 tablet by mouth daily.    [provider]  oxycodone (OXY-IR) 5 MG capsule Take 1-2 capsules (5-10 mg total) by mouth every 6 (six) hours as needed (MOD SEVERE pain). 06/16/18   Epifanio Lesches, MD  pantoprazole (PROTONIX) 40 MG tablet Take 40 mg by mouth daily.    [provider]  PROAIR HFA 108 [phone] Base) MCG/ACT inhaler Inhale 2 puffs into the lungs every 6 (six) hours as needed for wheezing or shortness of breath.  10/02/17   [provider]  Rivaroxaban (XARELTO) 15 MG TABS tablet Take 15 mg by mouth daily.    [provider]  rOPINIRole (REQUIP) 0.5 MG tablet Take 0.5 mg by mouth 2 (two) times daily.    [provider]  sitaGLIPtin (JANUVIA) 100 MG tablet Take 100 mg by mouth daily. 12/06/16    [provider]  spironolactone (ALDACTONE) 25 MG tablet Take 0.5 tablets (12.5 mg total) by mouth daily. 05/18/18   Salary, Avel Peace, MD  vitamin B-12 (CYANOCOBALAMIN) 1000 MCG tablet Take 1,000 mcg by mouth daily.    [provider]  vitamin C (ASCORBIC ACID) 500 MG tablet Take 500 mg by mouth daily.    [provider]  Vitamin D, Ergocalciferol, (DRISDOL) 50000 UNITS CAPS capsule Take 50,000 Units by mouth every 7 (seven) days. Pt takes on Thursday.    [provider]    Allergies Ceftriaxone; Cefuroxime; Cephalosporins; Codeine; Epinephrine; Morphine and related; Buprenorphine hcl; Ciprofloxacin; Latex; Lidocaine;  and Procaine  Family History  Problem Relation Age of Onset  . CVA Mother        Congestive Heart Failure, heart attack, hypertension, stroke  . Diabetes Mellitus II Sister   . Heart disease Father        Congestive Heart Failure, heart attack, hypertension    Social History Social History   Tobacco Use  . Smoking status: Never Smoker  . Smokeless tobacco: Never Used  Substance Use Topics  . Alcohol use: No    Alcohol/week: 0.0 standard drinks  . Drug use: No    Review of Systems Constitutional: No fever/chills Eyes: No visual changes. ENT: No sore throat. No stiff neck no neck pain Cardiovascular: Denies chest pain. Respiratory: + shortness of breath. Gastrointestinal:   no vomiting.  No diarrhea.  No constipation. Genitourinary: Negative for dysuria. Musculoskeletal: Negative lower extremity swelling Skin: Negative for rash. Neurological: Negative for severe headaches, focal weakness or numbness.   ____________________________________________   PHYSICAL EXAM:  VITAL SIGNS: ED Triage Vitals [07/05/18 0903]  Enc Vitals Group     BP (!) 134/48     Pulse Rate 73     Resp 14     Temp 99.5 F (37.5 C)     Temp Source Axillary     SpO2 (!) 82 %     Weight      Height      Head Circumference      Peak Flow       Pain Score      Pain Loc      Pain Edu?      Excl. in [place]?     Constitutional: Frail elderly woman, appears to be depleted but not in active distress Eyes: Conjunctivae are normal Head: Atraumatic HEENT: No congestion/rhinnorhea. Mucous membranes are moist.  Oropharynx non-erythematous Neck:   Nontender with no meningismus, no masses, no stridor Cardiovascular: Normal rate, regular rhythm. Grossly normal heart sounds.  Good peripheral circulation. Respiratory: Normal respiratory effort.  No retractions.  She is mild rhonchi appreciated but good air movement Abdominal: Soft and nontender. No distention. No guarding no rebound Back:  There is no focal tenderness or step off.  there is no midline tenderness there are no lesions noted. there is no CVA tenderness Musculoskeletal: No lower extremity tenderness, no upper extremity tenderness. No joint effusions, no DVT signs strong distal pulses no edema Neurologic:  Normal speech and language. No gross focal neurologic deficits are appreciated.  Skin:  Skin is warm, dry and intact. No rash noted. Psychiatric: Mood and affect are normal. Speech and behavior are normal.  ____________________________________________   LABS (all labs ordered are listed, but only abnormal results are displayed)  Labs Reviewed - No data to display  Pertinent labs  results that were available during my care of the patient were reviewed by me and considered in my medical decision making (see chart for details). ____________________________________________  EKG  I personally interpreted any EKGs ordered by me or triage Ventricularly paced rhythm rate 72 no further assessment ____________________________________________  RADIOLOGY  Pertinent labs & imaging results that were available during my care of the patient were reviewed by me and considered in my medical decision making (see chart for details). If possible, patient and/or family made aware of any  abnormal findings.  No results found. ____________________________________________    PROCEDURES  Procedure(s) performed: None  Procedures  Critical Care performed: CRITICAL CARE Performed by: Schuyler Amor   Total critical care time: 81  minutes  Critical care time was exclusive of separately billable procedures and treating other patients.  Critical care was necessary to treat or prevent imminent or life-threatening deterioration.  Critical care was time spent personally by me on the following activities: development of treatment plan with patient and/or surrogate as well as nursing, discussions with consultants, evaluation of patient's response to treatment, examination of patient, obtaining history from patient or surrogate, ordering and performing treatments and interventions, ordering and review of laboratory studies, ordering and review of radiographic studies, pulse oximetry and re-evaluation of patient's condition.   ____________________________________________   INITIAL IMPRESSION / ASSESSMENT AND PLAN / ED COURSE  Pertinent labs & imaging results that were available during my care of the patient were reviewed by me and considered in my medical decision making (see chart for details).  Elderly female discharged from the hospital very recently for pneumonia presents again with shortness of breath and cough that is persistent despite home antibiotics.  She is also feeling debilitated and weak.  She does have some rhonchi but she is not working too hard to breathe at this time, I do not think intubation is in the short-term likely, she is doing much better on oxygen.  We are going to recheck blood work chest x-ray and keep her on oxygen here we will try a breathing treatment although I do not appreciate much wheezing, we will send the fluid because she picked it up after she was admitted and we will reassess patient will need to be admitted     ____________________________________________   FINAL CLINICAL IMPRESSION(S) / ED DIAGNOSES  Final diagnoses:  None      This chart was dictated using voice recognition software.  Despite best efforts to proofread,  errors can occur which can change meaning.      Schuyler Amor, MD 07/05/18 [phone]

## 2018-07-05 NOTE — Progress Notes (Signed)
ADMISSION NOTE:  Pt admitted to room 158 from ER, Pt a&o X4. Pt on 2 L nasal cannula, Sacaral dressing applied, navigator completed. Daughter at bedside. Pt placed on tele, and droplet precautions. No complaints of pain. Bed in lowest position call bell in reach bed alarm on.

## 2018-07-05 NOTE — ED Triage Notes (Signed)
Pt to ED by EMS with hypoxia and increased weakness per home health. Pt recently dx with pneumonia and discharge 2 days ago.

## 2018-07-05 NOTE — H&P (Signed)
[facility] [place] [name]    MR#:  [id number]  DATE OF BIRTH:  Oct 31, 1924  DATE OF ADMISSION:  07/05/2018  PRIMARY CARE PHYSICIAN: Tracie Harrier, MD   REQUESTING/REFERRING PHYSICIAN: Dr. Charlotte Crumb  CHIEF COMPLAINT:   Chief Complaint  Patient presents with  . Weakness  . Shortness of Breath    HISTORY OF PRESENT ILLNESS:  [name]  is a 85 y.o. female with a known history of chronic diastolic CHF with EF of 31%, chronic low back pain, COPD not on home oxygen, diabetes and hypertension who was recently discharged from the hospital 2 days ago after treated for pneumonia comes back with worsening shortness of breath and weakness. Patient was discharged on Augmentin for her pneumonia.  She was noted to be pretty weak at discharge and physical therapy recommended rehab, however patient was discharged home as per her request.  According to family she never felt better, remained weak.  Since yesterday had significant fatigue, weakness, unable to move from bed and chills and nausea and vomiting.  Her influenza test last week was negative but influenza A PCR in the ED today was positive.  She is not on home oxygen, was hypoxic here in the emergency room and was started on 4 L oxygen.  BNP is elevated and chest x-ray showing some pulmonary vascular congestion.  She is being admitted for the same.  PAST MEDICAL HISTORY:   Past Medical History:  Diagnosis Date  . Cancer ([place])    skin ca  . CHF (congestive heart failure) ([place])   . Chronic back pain   . COPD (chronic obstructive pulmonary disease) ([place])   . Diabetes mellitus without complication ([employer])   . Hypertension   . Poor perfusion of leg     PAST SURGICAL HISTORY:   Past Surgical History:  Procedure Laterality Date  . ABDOMINAL HYSTERECTOMY    . BACK SURGERY     x3  . CORONARY STENT PLACEMENT     unknown location per pt  . FOOT SURGERY    . LEG SURGERY    .  PACEMAKER PLACEMENT    . TONSILLECTOMY      SOCIAL HISTORY:   Social History   Tobacco Use  . Smoking status: Never Smoker  . Smokeless tobacco: Never Used  Substance Use Topics  . Alcohol use: No    Alcohol/week: 0.0 standard drinks    FAMILY HISTORY:   Family History  Problem Relation Age of Onset  . CVA Mother        Congestive Heart Failure, heart attack, hypertension, stroke  . Diabetes Mellitus II Sister   . Heart disease Father        Congestive Heart Failure, heart attack, hypertension    DRUG ALLERGIES:   Allergies  Allergen Reactions  . Ceftriaxone Swelling    Tolerates piperacillin/tazobactam, PCN G  . Cefuroxime Hives    Tolerates piperacillin/tazobactam, PCN G  . Cephalosporins Swelling and Other (See Comments)    Reaction:  Fainting/dry mouth  Tolerates piperacillin/tazobactam, PCN G  . Codeine Nausea And Vomiting  . Epinephrine Other (See Comments)    Reaction:  Fainting   . Morphine And Related Other (See Comments)    Reaction:  GI upset   . Buprenorphine Hcl Nausea And Vomiting  . Ciprofloxacin Itching, Swelling and Rash  . Latex Rash  . Lidocaine Rash  . Procaine Rash    REVIEW OF SYSTEMS:   Review  of Systems  Constitutional: Positive for chills and malaise/fatigue. Negative for fever and weight loss.  HENT: Negative for ear discharge, ear pain, hearing loss and nosebleeds.   Eyes: Negative for blurred vision, double vision and photophobia.  Respiratory: Positive for shortness of breath. Negative for cough, hemoptysis and wheezing.   Cardiovascular: Negative for chest pain, palpitations, orthopnea and leg swelling.  Gastrointestinal: Positive for nausea. Negative for abdominal pain, constipation, diarrhea, heartburn, melena and vomiting.  Genitourinary: Negative for dysuria, frequency, hematuria and urgency.  Musculoskeletal: Positive for myalgias. Negative for back pain and neck pain.  Skin: Negative for rash.  Neurological: Positive  for dizziness and weakness. Negative for tingling, tremors, sensory change, speech change, focal weakness and headaches.  Endo/Heme/Allergies: Does not bruise/bleed easily.  Psychiatric/Behavioral: Negative for depression.    MEDICATIONS AT HOME:   Prior to Admission medications   Medication Sig Start Date End Date Taking? Authorizing Provider  acetaminophen (TYLENOL) 325 MG tablet Take 2 tablets (650 mg total) by mouth every 6 (six) hours as needed for mild pain (or Fever >/= 101). 04/18/17  Yes Gouru, Illene Silver, MD  amoxicillin-clavulanate (AUGMENTIN) 875-125 MG tablet Take 1 tablet by mouth 2 (two) times daily for 3 days. 07/03/18 07/06/18 Yes Vaughan Basta, MD  aspirin EC 81 MG tablet Take 81 mg by mouth daily.   Yes [provider]  baclofen (LIORESAL) 10 MG tablet Take 10 mg by mouth 3 (three) times daily as needed for muscle spasms.    Yes [provider]  buPROPion (WELLBUTRIN XL) 150 MG 24 hr tablet Take 150 mg by mouth daily.  11/28/17  Yes [provider]  carvedilol (COREG) 6.25 MG tablet Take 6.25 mg by mouth 2 (two) times daily with a meal.    Yes [provider]  cholecalciferol (VITAMIN D) 1000 units tablet Take 1,000 Units by mouth daily.   Yes [provider]  clopidogrel (PLAVIX) 75 MG tablet Take 75 mg by mouth daily.   Yes [provider]  diclofenac sodium (VOLTAREN) 1 % GEL Apply 2 g topically 4 (four) times daily.   Yes [provider]  docusate sodium (COLACE) 100 MG capsule Take 100 mg by mouth 2 (two) times daily.    Yes [provider]  escitalopram (LEXAPRO) 10 MG tablet Take 10 mg by mouth daily.   Yes [provider]  ferrous sulfate 325 (65 FE) MG tablet Take 325 mg by mouth daily with breakfast.   Yes [provider]  furosemide (LASIX) 40 MG tablet Take 1 tablet (40 mg total) by mouth 2 (two) times daily for 10 days. Take lasix  40 mg p.o. twice daily for 2 days then can  resume home dose.  Patient told me that she takes Lasix 80 mg alternating with 40 mg. 06/16/18 07/05/18 Yes Epifanio Lesches, MD  gabapentin (NEURONTIN) 100 MG capsule Take 100-200 mg by mouth at bedtime.   Yes [provider]  glimepiride (AMARYL) 4 MG tablet Take 4 mg by mouth 2 (two) times daily. 07/01/17  Yes [provider]  lactobacillus (FLORANEX/LACTINEX) PACK Take 1 g by mouth daily.   Yes [provider]  magnesium oxide (MAG-OX) 400 MG tablet Take 400 mg by mouth daily.   Yes [provider]  Melatonin 1 MG TABS Take 1 mg by mouth at bedtime.   Yes [provider]  metFORMIN (GLUCOPHAGE) 500 MG tablet Take 1 tablet by mouth daily. 12/10/17  Yes [provider]  Multiple Vitamin (  MULTIVITAMIN) tablet Take 1 tablet by mouth daily.   Yes [provider]  oxycodone (OXY-IR) 5 MG capsule Take 1-2 capsules (5-10 mg total) by mouth every 6 (six) hours as needed (MOD SEVERE pain). 06/16/18  Yes Epifanio Lesches, MD  pantoprazole (PROTONIX) 40 MG tablet Take 40 mg by mouth daily.   Yes [provider]  PROAIR HFA 108 (90 Base) MCG/ACT inhaler Inhale 2 puffs into the lungs every 6 (six) hours as needed for wheezing or shortness of breath.  10/02/17  Yes [provider]  Rivaroxaban (XARELTO) 15 MG TABS tablet Take 15 mg by mouth daily.   Yes [provider]  rOPINIRole (REQUIP) 0.5 MG tablet Take 0.5 mg by mouth 2 (two) times daily.   Yes [provider]  sitaGLIPtin (JANUVIA) 100 MG tablet Take 100 mg by mouth daily. 12/06/16  Yes [provider]  spironolactone (ALDACTONE) 25 MG tablet Take 0.5 tablets (12.5 mg total) by mouth daily. 05/18/18  Yes Salary, Avel Peace, MD  vitamin B-12 (CYANOCOBALAMIN) 1000 MCG tablet Take 1,000 mcg by mouth daily.   Yes [provider]  vitamin C (ASCORBIC ACID) 500 MG tablet Take 500 mg by mouth daily.   Yes [provider]  Vitamin D,  Ergocalciferol, (DRISDOL) 50000 UNITS CAPS capsule Take 50,000 Units by mouth every 7 (seven) days. Pt takes on Thursday.    [provider]      VITAL SIGNS:  Blood pressure (!) 143/67, pulse 73, temperature 99.5 F (37.5 C), temperature source Axillary, resp. rate (!) 23, SpO2 97 %.  PHYSICAL EXAMINATION:   Physical Exam  GENERAL:  85 y.o.-year-old elderly patient lying in the bed with no acute distress.  EYES: Pupils equal, round, reactive to light and accommodation. No scleral icterus. Extraocular muscles intact.  HEENT: Head atraumatic, normocephalic. Oropharynx and nasopharynx clear.  NECK:  Supple, no jugular venous distention. No thyroid enlargement, no tenderness.  LUNGS: Scant breath sounds bilaterally, decreased at the bases.  No wheezing rales,rhonchi or crepitation. No use of accessory muscles of respiration.  CARDIOVASCULAR: S1, S2 normal. No  rubs, or gallops.  3/6 systolic murmur is present ABDOMEN: Soft, nontender, nondistended. Bowel sounds present. No organomegaly or mass.  EXTREMITIES: No pedal edema, cyanosis, or clubbing.  NEUROLOGIC: Cranial nerves II through XII are intact. Muscle strength 5/5 in all extremities. Sensation intact. Gait not checked.  Global weakness noted PSYCHIATRIC: The patient is alert and oriented x 3.  SKIN: No obvious rash, lesion, or ulcer.   LABORATORY PANEL:   CBC Recent Labs  Lab 07/05/18 0916  WBC 7.6  HGB 9.9*  HCT 32.1*  PLT 172   ------------------------------------------------------------------------------------------------------------------  Chemistries  Recent Labs  Lab 07/05/18 0916  NA 139  K 3.9  CL 96*  CO2 33*  GLUCOSE 221*  BUN 31*  CREATININE 1.05*  CALCIUM 8.9  AST 14*  ALT 11  ALKPHOS 95  BILITOT 0.9   ------------------------------------------------------------------------------------------------------------------  Cardiac Enzymes Recent Labs  Lab 07/05/18 0916  TROPONINI 0.03*    ------------------------------------------------------------------------------------------------------------------  RADIOLOGY:  Dg Chest Port 1 View  Result Date: 07/05/2018 CLINICAL DATA:  Hypoxia. EXAM: PORTABLE CHEST 1 VIEW COMPARISON:  Radiograph of June 29, 2018. FINDINGS: Stable cardiomegaly. Mild central pulmonary vascular congestion is noted. Atherosclerosis of thoracic aorta is noted. Left-sided pacemaker is unchanged in position. No pneumothorax or significant pleural effusion is noted. No consolidative process is noted. Bony thorax is unremarkable. IMPRESSION: Stable cardiomegaly.  Mild central pulmonary vascular congestion. Aortic Atherosclerosis (  ICD10-I70.0). Electronically Signed   By: Marijo Conception, M.D.   On: 07/05/2018 10:03    EKG:   Orders placed or performed during the hospital encounter of 07/05/18  . ED EKG  . ED EKG  . EKG 12-Lead  . EKG 12-Lead    IMPRESSION AND PLAN:   [name]  is a 85 y.o. female with a known history of chronic diastolic CHF with EF of 91%, chronic low back pain, COPD not on home oxygen, diabetes and hypertension who was recently discharged from the hospital 2 days ago after treated for pneumonia comes back with worsening shortness of breath and weakness.  1.  Acute hypoxic respiratory failure-secondary to mild diastolic CHF exacerbation and also influenza illness -Received a dose of Lasix in the ED.  Continue oral Lasix after admission -Started Tamiflu, continue supplemental oxygen and wean as tolerated -Incentive spirometer recommended -Finish off the Augmentin, end date tomorrow  2.  Generalized weakness-physical therapy consult  3.  Diabetes mellitus-continue Tradjenta and metformin.  Add sliding scale insulin  4.  History of A. fib-status post pacemaker.  Continue cardiac medications -On Xarelto for anticoagulation  5.  Depression anxiety-continue home medications  Physical therapy consulted    All the records are  reviewed and case discussed with ED provider. Management plans discussed with the patient, family and they are in agreement.  CODE STATUS: Full code  TOTAL TIME TAKING CARE OF THIS PATIENT: 51 minutes.    Gladstone Lighter M.D on 07/05/2018 at 11:48 AM  Between 7am to 6pm - Pager - [phone]  After 6pm go to [email or web address] - password [email or web address]  Office  [phone]  CC: Primary care physician; Tracie Harrier, MD

## 2018-07-06 ENCOUNTER — Ambulatory Visit: Payer: Medicare Other | Admitting: Family

## 2018-07-06 LAB — BASIC METABOLIC PANEL
Anion gap: 9 (ref 5–15)
BUN: 26 mg/dL — ABNORMAL HIGH (ref 8–23)
CO2: 31 mmol/L (ref 22–32)
Calcium: 8.5 mg/dL — ABNORMAL LOW (ref 8.9–10.3)
Chloride: 99 mmol/L (ref 98–111)
Creatinine, Ser: 1.08 mg/dL — ABNORMAL HIGH (ref 0.44–1.00)
GFR calc Af Amer: 52 mL/min — ABNORMAL LOW (ref >60)
GFR calc non Af Amer: 45 mL/min — ABNORMAL LOW (ref >60)
GLUCOSE: 91 mg/dL (ref 70–99)
Potassium: 3.8 mmol/L (ref 3.5–5.1)
Sodium: 139 mmol/L (ref 135–145)

## 2018-07-06 LAB — CBC
HCT: 32.1 % — ABNORMAL LOW (ref 36.0–46.0)
Hemoglobin: 9.6 g/dL — ABNORMAL LOW (ref 12.0–15.0)
MCH: 28.5 pg (ref 26.0–34.0)
MCHC: 29.9 g/dL — ABNORMAL LOW (ref 30.0–36.0)
MCV: 95.3 fL (ref 80.0–100.0)
Platelets: 161 10*3/uL (ref 150–400)
RBC: 3.37 MIL/uL — ABNORMAL LOW (ref 3.87–5.11)
RDW: 15.4 % (ref 11.5–15.5)
WBC: 6.8 10*3/uL (ref 4.0–10.5)
nRBC: 0 % (ref 0.0–0.2)

## 2018-07-06 LAB — GLUCOSE, CAPILLARY
GLUCOSE-CAPILLARY: 85 mg/dL (ref 70–99)
GLUCOSE-CAPILLARY: 126 mg/dL — AB (ref 70–99)
Glucose-Capillary: 127 mg/dL — ABNORMAL HIGH (ref 70–99)
Glucose-Capillary: 215 mg/dL — ABNORMAL HIGH (ref 70–99)

## 2018-07-06 NOTE — Progress Notes (Signed)
Advanced Care Plan.  Purpose of Encounter: CODE STATUS Parties in Attendance: The patient and me. Patient's Decisional Capacity: Yes. Medical Story: [name]  is a 85 y.o. female with a known history of chronic diastolic CHF with EF of 42%, chronic low back pain, COPD not on home oxygen, diabetes and hypertension who was recently discharged from the hospital 2 days ago after treated for pneumonia comes back with worsening shortness of breath and weakness.  The patient is admitted for acute respiratory failure with hypoxia due to influenza A and acute on chronic diastolic CHF.  I discussed with the patient about her current condition, poor prognosis and CODE STATUS.  The patient does not want to be resuscitated or intubated if she has cardiopulmonary arrest.  She has recurrent admission due to respiratory failure, pneumonia, CHF and influenza.  She needs palliative care or hospice care. Goals of Care Determinations: Palliative care or hospice care. Plan: Palliative care consult. Code Status: DNR. Time spent discussing advance care planning: 18 minutes.

## 2018-07-06 NOTE — Progress Notes (Signed)
[place] [facility] [place] [name]    MR#:  [id number]  DATE OF BIRTH:  1925-03-19  SUBJECTIVE:  CHIEF COMPLAINT:   Chief Complaint  Patient presents with  . Weakness  . Shortness of Breath   Patient complains of cough, shortness of breath and the generalized weakness.  She is on oxygen 2 L by nasal cannula.  She has recurrent admissions 3 times for the past 3 weeks. REVIEW OF SYSTEMS:  Review of Systems  Constitutional: Positive for malaise/fatigue. Negative for chills and fever.  HENT: Negative for sore throat.   Eyes: Negative for blurred vision and double vision.  Respiratory: Positive for cough and shortness of breath. Negative for hemoptysis, sputum production, wheezing and stridor.   Cardiovascular: Negative for chest pain, palpitations, orthopnea and leg swelling.  Gastrointestinal: Negative for abdominal pain, blood in stool, diarrhea, melena, nausea and vomiting.  Genitourinary: Negative for dysuria, flank pain and hematuria.  Musculoskeletal: Negative for back pain and joint pain.  Skin: Negative for rash.  Neurological: Negative for dizziness, sensory change, focal weakness, seizures, loss of consciousness, weakness and headaches.  Endo/Heme/Allergies: Negative for polydipsia.  Psychiatric/Behavioral: Negative for depression. The patient is not nervous/anxious.     DRUG ALLERGIES:   Allergies  Allergen Reactions  . Ceftriaxone Swelling    Tolerates piperacillin/tazobactam, PCN G  . Cefuroxime Hives    Tolerates piperacillin/tazobactam, PCN G  . Cephalosporins Swelling and Other (See Comments)    Reaction:  Fainting/dry mouth  Tolerates piperacillin/tazobactam, PCN G  . Codeine Nausea And Vomiting  . Epinephrine Other (See Comments)    Reaction:  Fainting   . Morphine And Related Other (See Comments)    Reaction:  GI upset   . Buprenorphine Hcl Nausea And Vomiting  . Ciprofloxacin Itching, Swelling and Rash    . Latex Rash  . Lidocaine Rash  . Procaine Rash   VITALS:  Blood pressure (!) 122/56, pulse 75, temperature 98.7 F (37.1 C), temperature source Oral, resp. rate 17, height 5\' 7"  (1.702 m), weight 80.5 kg, SpO2 93 %. PHYSICAL EXAMINATION:  Physical Exam Constitutional:      General: She is not in acute distress. HENT:     Head: Normocephalic.     Mouth/Throat:     Mouth: Mucous membranes are moist.  Eyes:     General: No scleral icterus.    Conjunctiva/sclera: Conjunctivae normal.     Pupils: Pupils are equal, round, and reactive to light.  Neck:     Musculoskeletal: Normal range of motion and neck supple.     Vascular: No JVD.     Trachea: No tracheal deviation.  Cardiovascular:     Rate and Rhythm: Normal rate and regular rhythm.     Heart sounds: Normal heart sounds. No murmur. No gallop.   Pulmonary:     Effort: Pulmonary effort is normal. No respiratory distress.     Breath sounds: No stridor. Rales present. No wheezing or rhonchi.  Abdominal:     General: Bowel sounds are normal. There is no distension.     Palpations: Abdomen is soft.     Tenderness: There is no abdominal tenderness. There is no rebound.  Musculoskeletal: Normal range of motion.        General: No tenderness.     Right lower leg: No edema.     Left lower leg: No edema.  Skin:    Findings: No erythema or rash.  Neurological:  General: No focal deficit present.     Mental Status: She is alert and oriented to person, place, and time.     Cranial Nerves: No cranial nerve deficit.  Psychiatric:        Mood and Affect: Mood normal.    LABORATORY PANEL:  Female CBC Recent Labs  Lab 07/06/18 0401  WBC 6.8  HGB 9.6*  HCT 32.1*  PLT 161   ------------------------------------------------------------------------------------------------------------------ Chemistries  Recent Labs  Lab 07/05/18 0916 07/06/18 0401  NA 139 139  K 3.9 3.8  CL 96* 99  CO2 33* 31  GLUCOSE 221* 91  BUN 31*  26*  CREATININE 1.05* 1.08*  CALCIUM 8.9 8.5*  AST 14*  --   ALT 11  --   ALKPHOS 95  --   BILITOT 0.9  --    RADIOLOGY:  No results found. ASSESSMENT AND PLAN:   [name]  is a 85 y.o. female with a known history of chronic diastolic CHF with EF of 81%, chronic low back pain, COPD not on home oxygen, diabetes and hypertension who was recently discharged from the hospital 2 days ago after treated for pneumonia comes back with worsening shortness of breath and weakness.  1.  Acute hypoxic respiratory failure-secondary to mild diastolic CHF exacerbation and also influenza illness Continue Lasix, spironolactone and Tamiflu, continue supplemental oxygen and wean as tolerated -Incentive spirometer. -Finish the Augmentin today.  2.  Generalized weakness-physical therapy.  3.  Diabetes mellitus-hold Tradjenta and metformin.  Continue sliding scale insulin  4.  History of A. fib-status post pacemaker.  Continue cardiac medications -On Xarelto for anticoagulation  5.  Depression anxiety-continue home medications  Recurrent admission, poor prognosis.  Palliative care consult. All the records are reviewed and case discussed with Care Management/Social Worker. Management plans discussed with the patient, family and they are in agreement.  CODE STATUS: DNR  TOTAL TIME TAKING CARE OF THIS PATIENT: 28 minutes.   More than 50% of the time was spent in counseling/coordination of care: YES  POSSIBLE D/C IN 2-3 DAYS, DEPENDING ON CLINICAL CONDITION.   Demetrios Loll M.D on 07/06/2018 at 3:04 PM  Between 7am to 6pm - Pager - [phone]  After 6pm go to [email or web address] - [facility]

## 2018-07-06 NOTE — Clinical Social Work Placement (Signed)
   CLINICAL SOCIAL WORK PLACEMENT  NOTE  Date:  07/06/2018  Patient Details  [name] MRN: [id number] Date of Birth: 18-Jan-1925  Clinical Social Work is seeking post-discharge placement for this patient at the [place] level of care (*CSW will initial, date and re-position this form in  chart as items are completed):  Yes   Patient/family provided with [place] Work Department's list of facilities offering this level of care within the geographic area requested by the patient (or if unable, by the patient's family).  Yes   Patient/family informed of their freedom to choose among providers that offer the needed level of care, that participate in Medicare, Medicaid or managed care program needed by the patient, have an available bed and are willing to accept the patient.      Patient/family informed of [facility]'s ownership interest in [facility] and [facility], as well as of the fact that they are under no obligation to receive care at these facilities.  PASRR submitted to EDS on       PASRR number received on       Existing PASRR number confirmed on 07/06/18     FL2 transmitted to all facilities in geographic area requested by pt/family on 07/06/18     FL2 transmitted to all facilities within larger geographic area on       Patient informed that his/her managed care company has contracts with or will negotiate with certain facilities, including the following:            Patient/family informed of bed offers received.  Patient chooses bed at       Physician recommends and patient chooses bed at      Patient to be transferred to   on  .  Patient to be transferred to facility by       Patient family notified on   of transfer.  Name of family member notified:        PHYSICIAN       Additional Comment:    _______________________________________________ Auguste Tebbetts, Veronia Beets, LCSW 07/06/2018, 4:12 PM

## 2018-07-06 NOTE — Clinical Social Work Note (Signed)
Clinical Social Work Assessment  Patient Details  [name] MRN: [id number] Date of Birth: [birth year]  Date of referral:  07/06/18               Reason for consult:  Facility Placement                Permission sought to share information with:  Chartered certified accountant granted to share information::  Yes, Verbal Permission Granted  Name::      [place]::   [place]   Relationship::     Contact Information:     Housing/Transportation Living arrangements for the past 2 months:  [place] of Information:  Patient Patient Interpreter Needed:  None Criminal Activity/Legal Involvement Pertinent to Current Situation/Hospitalization:  No - Comment as needed Significant Relationships:  Adult Children, Spouse Lives with:  Spouse Do you feel safe going back to the place where you live?  Yes Need for family participation in patient care:  Yes (Comment)  Care giving concerns:  Patient lives in [place] with her husband Vicente Serene.    Social Worker assessment / plan:  Holiday representative (CSW) received verbal consult from RN case manager that patient wants to go to SNF this admission. CSW is familiar with patient from last admission. Per patient she wants to go to SNF this time. CSW explained that [facility] will have to approve SNF. CSW also explained that her SNF offers will be limited because she is positive for the flu. Patient verbalized her understanding and is agreeable to SNF search in [place]. FL2 complete and faxed out. CSW will continue to follow and assist as needed.    Employment status:  Retired Nurse, adult PT Recommendations:  Not assessed at this time Information / Referral to community resources:  [place]  Patient/Family's Response to care:  Patient requested SNF.   Patient/Family's Understanding of and Emotional Response to Diagnosis, Current  Treatment, and Prognosis:  Patient was very pleasant and thanked CSW for assistance.   Emotional Assessment Appearance:  Appears stated age Attitude/Demeanor/Rapport:    Affect (typically observed):  Accepting, Adaptable, Pleasant Orientation:  Oriented to Self, Oriented to Place, Oriented to  Time, Oriented to Situation Alcohol / Substance use:  Not Applicable Psych involvement (Current and /or in the community):  No (Comment)  Discharge Needs  Concerns to be addressed:  Discharge Planning Concerns Readmission within the last 30 days:  Yes Current discharge risk:  Dependent with Mobility Barriers to Discharge:  Continued Medical Work up   [employer], Veronia Beets, LCSW 07/06/2018, 4:13 PM

## 2018-07-06 NOTE — Progress Notes (Signed)
PT Cancellation Note  Patient Details Name: Naiyah Klostermann MRN: [id number] DOB: [birth year]   Cancelled Treatment:    Reason Eval/Treat Not Completed: Patient declined, no reason specified- patient fatigued, unable to hear me at all, patient on droplet precautions, reports her hearing aide went out last night.     Arwa Yero 07/06/2018, 11:15 AM

## 2018-07-06 NOTE — NC FL2 (Signed)
[place] LEVEL OF CARE SCREENING TOOL     IDENTIFICATION  Patient [name] Birthdate: 11/24/1924 Sex: female Admission Date (Current Location): 07/05/2018  [place] and [place] Number:  Engineering geologist and Address:  [facility], [address] [place],  [address]      Provider Number: [id number]  Attending Physician Name and Address:  Demetrios Loll, MD  Relative Name and Phone Number:       Current Level of Care: Hospital Recommended Level of Care: [place] Prior Approval Number:    Date Approved/Denied:   PASRR Number: [id number])  Discharge Plan: SNF    Current Diagnoses: Patient Active Problem List   Diagnosis Date Noted  . Influenza A 07/05/2018  . CHF exacerbation ([place]) 06/14/2018  . Cellulitis of left lower extremity 06/02/2018  . ARF (acute renal failure) ([place]) 05/17/2018  . Pneumonia 12/11/2017  . CAP (community acquired pneumonia) 04/14/2017  . COPD (chronic obstructive pulmonary disease) ([place]) 12/07/2016  . UTI (urinary tract infection) 12/07/2016  . Ventral hernia without obstruction or gangrene   . Hyponatremia 09/07/2015  . Acute respiratory failure ([place]) 09/01/2015  . Atrial fibrillation ([place]) 04/10/2015  . Essential hypertension 04/10/2015  . Diabetes ([place]) 04/10/2015  . Acute on chronic combined systolic and diastolic CHF (congestive heart failure) ([place]) 03/21/2015  . Pulmonary nodule 11/10/2014    Orientation RESPIRATION BLADDER Height & Weight     Self, Time, Situation, Place  O2(2 Liters Oxygen. ) Continent Weight: 177 lb 7.5 oz (80.5 kg) Height:  5\' 7"  (170.2 cm)  BEHAVIORAL SYMPTOMS/MOOD NEUROLOGICAL BOWEL NUTRITION STATUS      Continent Diet(Diet: Heart Healthy )  AMBULATORY STATUS COMMUNICATION OF NEEDS Skin   Extensive Assist Verbally Normal                       Personal Care Assistance Level of Assistance  Bathing, Feeding, Dressing Bathing  Assistance: Limited assistance Feeding assistance: Independent Dressing Assistance: Limited assistance     Functional Limitations Info  Sight, Hearing, Speech Sight Info: Impaired Hearing Info: Impaired Speech Info: Adequate    SPECIAL CARE FACTORS FREQUENCY  PT (By licensed PT), OT (By licensed OT)     PT Frequency: (5) OT Frequency: (5)            Contractures      Additional Factors Info  Code Status, Allergies   Isolation: Flu positive for 1/12 Code Status Info: (DNR ) Allergies Info: (Ceftriaxone, Cefuroxime, Cephalosporins, Codeine, Epinephrine, Morphine And Related, Buprenorphine Hcl, Ciprofloxacin, Latex, Lidocaine, Procaine)           Current Medications (07/06/2018):  This is the current hospital active medication list Current Facility-Administered Medications  Medication Dose Route Frequency Provider Last Rate Last Dose  . acetaminophen (TYLENOL) tablet 650 mg  650 mg Oral Q6H PRN Gladstone Lighter, MD      . amoxicillin-clavulanate (AUGMENTIN) 875-125 MG per tablet 1 tablet  1 tablet Oral BID Gladstone Lighter, MD   1 tablet at 07/06/18 0906  . aspirin EC tablet 81 mg  81 mg Oral Daily Gladstone Lighter, MD   81 mg at 07/06/18 0908  . buPROPion (WELLBUTRIN XL) 24 hr tablet 150 mg  150 mg Oral Daily Gladstone Lighter, MD   150 mg at 07/06/18 0911  . carvedilol (COREG) tablet 6.25 mg  6.25 mg Oral BID WC Gladstone Lighter, MD   6.25 mg at 07/06/18 0910  . cholecalciferol (VITAMIN D3) tablet  1,000 Units  1,000 Units Oral Daily Gladstone Lighter, MD   1,000 Units at 07/06/18 0911  . clopidogrel (PLAVIX) tablet 75 mg  75 mg Oral Daily Gladstone Lighter, MD   75 mg at 07/06/18 0908  . docusate sodium (COLACE) capsule 100 mg  100 mg Oral BID Gladstone Lighter, MD   100 mg at 07/06/18 0911  . escitalopram (LEXAPRO) tablet 10 mg  10 mg Oral Daily Gladstone Lighter, MD   10 mg at 07/06/18 0908  . ferrous sulfate tablet 325 mg  325 mg Oral Q breakfast  Gladstone Lighter, MD   325 mg at 07/06/18 0906  . furosemide (LASIX) tablet 40 mg  40 mg Oral BID Gladstone Lighter, MD   40 mg at 07/06/18 0907  . gabapentin (NEURONTIN) capsule 100-200 mg  100-200 mg Oral QHS Gladstone Lighter, MD   200 mg at 07/05/18 2107  . insulin aspart (novoLOG) injection 0-5 Units  0-5 Units Subcutaneous QHS Gladstone Lighter, MD      . insulin aspart (novoLOG) injection 0-9 Units  0-9 Units Subcutaneous TID WC Gladstone Lighter, MD   1 Units at 07/06/18 1234  . lactobacillus (FLORANEX/LACTINEX) granules 1 g  1 g Oral Daily Gladstone Lighter, MD   1 g at 07/06/18 0914  . magnesium oxide (MAG-OX) tablet 400 mg  400 mg Oral Daily Gladstone Lighter, MD   400 mg at 07/06/18 0910  . Melatonin TABS 2.5 mg  2.5 mg Oral QHS Gladstone Lighter, MD      . multivitamin with minerals tablet 1 tablet  1 tablet Oral Daily Gladstone Lighter, MD   1 tablet at 07/06/18 0906  . ondansetron (ZOFRAN) tablet 4 mg  4 mg Oral Q6H PRN Gladstone Lighter, MD       Or  . ondansetron (ZOFRAN) injection 4 mg  4 mg Intravenous Q6H PRN Gladstone Lighter, MD      . oseltamivir (TAMIFLU) capsule 30 mg  30 mg Oral BID Gladstone Lighter, MD   30 mg at 07/06/18 0906  . oxyCODONE (Oxy IR/ROXICODONE) immediate release tablet 5-10 mg  5-10 mg Oral Q6H PRN Gladstone Lighter, MD   10 mg at 07/05/18 2108  . pantoprazole (PROTONIX) EC tablet 40 mg  40 mg Oral Daily Gladstone Lighter, MD   40 mg at 07/06/18 0908  . Rivaroxaban (XARELTO) tablet 15 mg  15 mg Oral q1800 Gladstone Lighter, MD   15 mg at 07/05/18 1802  . rOPINIRole (REQUIP) tablet 0.5 mg  0.5 mg Oral BID Gladstone Lighter, MD   0.5 mg at 07/06/18 0907  . spironolactone (ALDACTONE) tablet 12.5 mg  12.5 mg Oral Daily Gladstone Lighter, MD   12.5 mg at 07/06/18 0907  . vitamin B-12 (CYANOCOBALAMIN) tablet 1,000 mcg  1,000 mcg Oral Daily Gladstone Lighter, MD   1,000 mcg at 07/06/18 0906  . vitamin C (ASCORBIC ACID) tablet 500 mg  500 mg  Oral Daily Gladstone Lighter, MD   500 mg at 07/06/18 0908     Discharge Medications: Please see discharge summary for a list of discharge medications.  Relevant Imaging Results:  Relevant Lab Results:   Additional Information (SSN: [id number])  Viggo Perko, Veronia Beets, LCSW

## 2018-07-07 DIAGNOSIS — Z515 Encounter for palliative care: Secondary | ICD-10-CM

## 2018-07-07 DIAGNOSIS — J101 Influenza due to other identified influenza virus with other respiratory manifestations: Secondary | ICD-10-CM

## 2018-07-07 DIAGNOSIS — Z7189 Other specified counseling: Secondary | ICD-10-CM

## 2018-07-07 LAB — GLUCOSE, CAPILLARY
Glucose-Capillary: 102 mg/dL — ABNORMAL HIGH (ref 70–99)
Glucose-Capillary: 212 mg/dL — ABNORMAL HIGH (ref 70–99)
Glucose-Capillary: 176 mg/dL — ABNORMAL HIGH (ref 70–99)
Glucose-Capillary: 226 mg/dL — ABNORMAL HIGH (ref 70–99)

## 2018-07-07 MED ORDER — SENNA 8.6 MG PO TABS
1.0000 | ORAL_TABLET | Freq: Every day | ORAL | Status: DC
  Administered 2018-07-07 – 2018-07-09 (×3): 8.6 mg via ORAL
  Filled 2018-07-07 (×2): qty 1

## 2018-07-07 NOTE — Evaluation (Signed)
Physical Therapy Evaluation Patient Details [name] MRN: [id number] DOB: 1924-12-01 Today's Date: 07/07/2018   History of Present Illness   85 yo female was referred to PT after being admitted for flu, hypoxic and pulm vasc congestion.  Has CHF symptoms, on a purwick.  PMHx:  HTN, skin CA, atherosclerosis, cardiomegaly, COPD, DM, L foot infection, pacemaker, PNA, back pain  Clinical Impression  Pt was seen for mobility after having been encouraged by nursing to attempt to be up in chair. Her plan is to request SNF care, with a focus on managing RW with assist to transition to independent.  Her balance is poor in standing, endurance low and LE strength has declined since her illness, which has expanded from a week ago.  Will follow acutely for these concerns.    Follow Up Recommendations SNF    Equipment Recommendations  None recommended by PT    Recommendations for Other Services       Precautions / Restrictions Precautions Precautions: Fall(telemetry) Precaution Comments: monitor O2 sats Required Braces or Orthoses: Other Brace(L foot ortho shoe to protect infectious wound) Other Brace: ortho shoes Restrictions Weight Bearing Restrictions: No LLE Weight Bearing: Weight bearing as tolerated Other Position/Activity Restrictions: post op shoe on left LE due to existing wound. Pt also prefers to ambulate with R shoe in place.       Mobility  Bed Mobility Overal bed mobility: Needs Assistance Bed Mobility: Supine to Sit     Supine to sit: Min guard        Transfers Overall transfer level: Needs assistance Equipment used: Rolling walker (2 wheeled) Transfers: Sit to/from Stand Sit to Stand: Max assist         General transfer comment: Pt was cued for hand placement and insisted on using RW, dense cues first trial then was min cues for the rest  Ambulation/Gait Ambulation/Gait assistance: Mod assist Gait Distance (Feet): 4 Feet Assistive device:  Rolling walker (2 wheeled) Gait Pattern/deviations: Step-to pattern;Shuffle;Wide base of support Gait velocity: reduced Gait velocity interpretation: <1.8 ft/sec, indicate of risk for recurrent falls General Gait Details: trunk flexion, shuffling step, wide base of support  Stairs            Wheelchair Mobility    Modified Rankin (Stroke Patients Only)       Balance Overall balance assessment: Needs assistance Sitting-balance support: Feet supported Sitting balance-Leahy Scale: Good     Standing balance support: Bilateral upper extremity supported;During functional activity Standing balance-Leahy Scale: Poor Standing balance comment: RW at baseline                             Pertinent Vitals/Pain Pain Assessment: No/denies pain    Home Living Family/patient expects to be discharged to:: Private residence Living Arrangements: Spouse/significant other Available Help at Discharge: Family;Available PRN/intermittently;Personal care attendant Type of Home: House Home Access: Ramped entrance     Home Layout: One level;Other (Comment);Able to live on main level with bedroom/bathroom(one step to enter part of house) Home Equipment: Walker - 2 wheels;Shower seat;Wheelchair - manual;Shower seat - built in;Grab bars - toilet;Grab bars - tub/shower;Bedside commode Additional Comments: one entrance is ramped    Prior Function Level of Independence: Needs assistance   Gait / Transfers Assistance Needed: RW out, rollator in house  ADL's / Homemaking Assistance Needed: 9-1 caretaker, then back at 5 to stay overnight        Hand Dominance   Dominant  Hand: Right    Extremity/Trunk Assessment   Upper Extremity Assessment Upper Extremity Assessment: Overall WFL for tasks assessed    Lower Extremity Assessment Lower Extremity Assessment: Generalized weakness    Cervical / Trunk Assessment Cervical / Trunk Assessment: Kyphotic  Communication    Communication: HOH  Cognition Arousal/Alertness: Awake/alert Behavior During Therapy: WFL for tasks assessed/performed Overall Cognitive Status: Within Functional Limits for tasks assessed                                        General Comments General comments (skin integrity, edema, etc.): pt reports her ability is limited but plans to return home after SNF    Exercises     Assessment/Plan    PT Assessment Patient needs continued PT services  PT Problem List Decreased range of motion;Decreased activity tolerance;Decreased balance;Decreased mobility;Decreased coordination;Decreased knowledge of use of DME;Decreased safety awareness;Cardiopulmonary status limiting activity;Decreased skin integrity;Decreased strength       PT Treatment Interventions DME instruction;Gait training;Functional mobility training;Therapeutic activities;Therapeutic exercise;Balance training;Neuromuscular re-education;Patient/family education    PT Goals (Current goals can be found in the Care Plan section)  Acute Rehab PT Goals Patient Stated Goal: to go home PT Goal Formulation: With patient Time For Goal Achievement: 07/21/18 Potential to Achieve Goals: Fair    Frequency Min 2X/week   Barriers to discharge Decreased caregiver support      Co-evaluation               AM-PAC PT "6 Clicks" Mobility  Outcome Measure Help needed turning from your back to your side while in a flat bed without using bedrails?: A Little Help needed moving from lying on your back to sitting on the side of a flat bed without using bedrails?: A Little Help needed moving to and from a bed to a chair (including a wheelchair)?: A Lot Help needed standing up from a chair using your arms (e.g., wheelchair or bedside chair)?: A Lot Help needed to walk in hospital room?: A Lot Help needed climbing 3-5 steps with a railing? : Total 6 Click Score: 13    End of Session Equipment Utilized During Treatment:  Gait belt;Oxygen Activity Tolerance: Patient limited by fatigue Patient left: in chair;with call bell/phone within reach;with chair alarm set;with nursing/sitter in room Nurse Communication: Mobility status;Other (comment)(reported O2 sats) PT Visit Diagnosis: Unsteadiness on feet (R26.81);History of falling (Z91.81);Adult, failure to thrive (R62.7);Muscle weakness (generalized) (M62.81)    Time: 8295-6213 PT Time Calculation (min) (ACUTE ONLY): 28 min   Charges:   PT Evaluation $PT Eval Moderate Complexity: 1 Mod PT Treatments $Gait Training: 8-22 mins       Ramond Dial 07/07/2018, 11:35 AM  Mee Hives, PT MS Acute Rehab Dept. Number: [place]

## 2018-07-07 NOTE — Progress Notes (Signed)
Clinical Education officer, museum (CSW) presented bed offers to patient. Patient asked for CSW to call her daughter [name] with bed offers. CSW contacted [name] and presented bed offers. Daughter chose [facility]. Per [facility] admissions coordinator at [facility] she will start [facility] SNF authorization.   [name] approached CSW this afternoon and stated that patient has changed her mind and now wants to go to [facility]. Per [facility]til[facility] liaison she will start [facility] SN[facility] authorization today. [facility] admissions coordinator at [facility] is aware of above and has agreed to stop [facility] SNF authorization for [place]. CSW will continue to follow and assist as needed.   [employer], LCSW [phone]

## 2018-07-07 NOTE — Progress Notes (Signed)
[place] at [place] [name]    MR#:  [id number]  DATE OF BIRTH:  [birth year]  SUBJECTIVE:  CHIEF COMPLAINT:   Chief Complaint  Patient presents with  . Weakness  . Shortness of Breath   Patient still has cough, shortness of breath and the generalized weakness.  She is on oxygen 2 L by nasal cannula.  REVIEW OF SYSTEMS:  Review of Systems  Constitutional: Positive for malaise/fatigue. Negative for chills and fever.  HENT: Negative for sore throat.   Eyes: Negative for blurred vision and double vision.  Respiratory: Positive for cough and shortness of breath. Negative for hemoptysis, sputum production, wheezing and stridor.   Cardiovascular: Negative for chest pain, palpitations, orthopnea and leg swelling.  Gastrointestinal: Negative for abdominal pain, blood in stool, diarrhea, melena, nausea and vomiting.  Genitourinary: Negative for dysuria, flank pain and hematuria.  Musculoskeletal: Negative for back pain and joint pain.  Skin: Negative for rash.  Neurological: Negative for dizziness, sensory change, focal weakness, seizures, loss of consciousness, weakness and headaches.  Endo/Heme/Allergies: Negative for polydipsia.  Psychiatric/Behavioral: Negative for depression. The patient is not nervous/anxious.     DRUG ALLERGIES:   Allergies  Allergen Reactions  . Ceftriaxone Swelling    Tolerates piperacillin/tazobactam, PCN G  . Cefuroxime Hives    Tolerates piperacillin/tazobactam, PCN G  . Cephalosporins Swelling and Other (See Comments)    Reaction:  Fainting/dry mouth  Tolerates piperacillin/tazobactam, PCN G  . Codeine Nausea And Vomiting  . Epinephrine Other (See Comments)    Reaction:  Fainting   . Morphine And Related Other (See Comments)    Reaction:  GI upset   . Buprenorphine Hcl Nausea And Vomiting  . Ciprofloxacin Itching, Swelling and Rash  . Latex Rash  . Lidocaine Rash  . Procaine Rash   VITALS:   Blood pressure 135/62, pulse 73, temperature 98.7 F (37.1 C), temperature source Oral, resp. rate 20, height 5\' 7"  (1.702 m), weight 80.5 kg, SpO2 93 %. PHYSICAL EXAMINATION:  Physical Exam Constitutional:      General: She is not in acute distress. HENT:     Head: Normocephalic.     Mouth/Throat:     Mouth: Mucous membranes are moist.  Eyes:     General: No scleral icterus.    Conjunctiva/sclera: Conjunctivae normal.     Pupils: Pupils are equal, round, and reactive to light.  Neck:     Musculoskeletal: Normal range of motion and neck supple.     Vascular: No JVD.     Trachea: No tracheal deviation.  Cardiovascular:     Rate and Rhythm: Normal rate and regular rhythm.     Heart sounds: Normal heart sounds. No murmur. No gallop.   Pulmonary:     Effort: Pulmonary effort is normal. No respiratory distress.     Breath sounds: No stridor. Rales present. No wheezing or rhonchi.  Abdominal:     General: Bowel sounds are normal. There is no distension.     Palpations: Abdomen is soft.     Tenderness: There is no abdominal tenderness. There is no rebound.  Musculoskeletal: Normal range of motion.        General: No tenderness.     Right lower leg: No edema.     Left lower leg: No edema.  Skin:    Findings: No erythema or rash.  Neurological:     General: No focal deficit present.     Mental Status:  She is alert and oriented to person, place, and time.     Cranial Nerves: No cranial nerve deficit.  Psychiatric:        Mood and Affect: Mood normal.    LABORATORY PANEL:  Female CBC Recent Labs  Lab 07/06/18 0401  WBC 6.8  HGB 9.6*  HCT 32.1*  PLT 161   ------------------------------------------------------------------------------------------------------------------ Chemistries  Recent Labs  Lab 07/05/18 0916 07/06/18 0401  NA 139 139  K 3.9 3.8  CL 96* 99  CO2 33* 31  GLUCOSE 221* 91  BUN 31* 26*  CREATININE 1.05* 1.08*  CALCIUM 8.9 8.5*  AST 14*  --   ALT  11  --   ALKPHOS 95  --   BILITOT 0.9  --    RADIOLOGY:  No results found. ASSESSMENT AND PLAN:   [name]  is a 85 y.o. female with a known history of chronic diastolic CHF with EF of 58%, chronic low back pain, COPD not on home oxygen, diabetes and hypertension who was recently discharged from the hospital 2 days ago after treated for pneumonia comes back with worsening shortness of breath and weakness.  1.  Acute hypoxic respiratory failure-secondary to acute on chronic diastolic CHF and also influenza A. Continue Lasix, spironolactone and Tamiflu, continue supplemental oxygen and wean as tolerated -Incentive spirometer. -Finished the Augmentin.  2.  Generalized weakness-physical therapy.  3.  Diabetes mellitus-hold Tradjenta and metformin.  Continue sliding scale insulin  4.  History of A. fib-status post pacemaker.  Continue cardiac medications -On Xarelto for anticoagulation  5.  Depression anxiety-continue home medications  Recurrent admission, poor prognosis. Discussed with palliative care nurse practitioner. All the records are reviewed and case discussed with Care Management/Social Worker. Management plans discussed with the patient, family and they are in agreement.  CODE STATUS: DNR  TOTAL TIME TAKING CARE OF THIS PATIENT: 27 minutes.   More than 50% of the time was spent in counseling/coordination of care: YES  POSSIBLE D/C IN 2 DAYS, DEPENDING ON CLINICAL CONDITION.   Demetrios Loll M.D on 07/07/2018 at 3:12 PM  Between 7am to 6pm - Pager - [phone]  After 6pm go to [email or web address] - [facility]

## 2018-07-07 NOTE — Consult Note (Signed)
Consultation Note Date: 07/07/2018   Patient [name]  DOB: May 03, 1925  MRN: [id number]  Age / Sex: 85 y.o., female  PCP: Tracie Harrier, MD Referring Physician: Demetrios Loll, MD  Reason for Consultation: Establishing goals of care  HPI/Patient Profile: 85 y.o. female  with past medical history of diastolic CHF (EF 83%), chronic low back pain, COPD, diabetes, h/o cellulitis, recurrent admissions admitted on 07/05/2018 with weakness and SOB r/t influenza A +.   Clinical Assessment and Goals of Care: I met today with Ms. Casa. Ms. 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 She has a husband who she lives with but he has memory issues and falls and she is more his caregiver. They have caregivers that are in the home 9a-1p and 5/6p-10p. She has a daughter who lives in [place] and a son in [place] (he has his own health issues).   Ms. Bucaro shares with me that she feels tired and that she is a burden to her family. She tells me that she just wants to go to sleep and die. She feels depressed. Her daughter, [name], arrives at bedside. We discuss Ms. Stiefel's wishes and aggressiveness of care. Ms. Mellone says she wants to die but then talks of wanting to improve. I worry that if we were to ask her next week that she may not request comfort care and to die. She is considering short term rehab and her ultimate goal is to return home and be with her husband. [name] shares that help at home can be arranged for after rehab or now if she continues to say that she is done and no longer wants to continue measures to keep her alive. Emotional support provided to Ms. Stender.   I spoke further with [name] who also is concerned that this may be a fleeting thought for her mother. However, [name] is prepared to get her mother home  with increased caregivers and with hospice if this is her mother's desire. I will follow with Ms. Ke to reassess goals while hospitalized and recommend outpatient palliative to follow if she decides to go to rehab. I will continue to discuss with Ms. Paster as well as [name].   Primary Decision Maker PATIENT    SUMMARY OF RECOMMENDATIONS   - Considering rehab vs home with hospice and aggressiveness of care desired.  Code Status/Advance Care Planning:  DNR   Symptom Management:   Pain: Tylenol prn.   Palliative Prophylaxis:   Aspiration, Delirium Protocol and Frequent Pain Assessment  Psycho-social/Spiritual:   Desire for further Chaplaincy support:yes  Additional Recommendations: Caregiving  Support/Resources and Education on Hospice  Prognosis:   Overall prognosis is poor with advanced age, co-morbidities and recurrent hospitalizations.   Discharge Planning: To Be Determined      Primary Diagnoses: Present on Admission: . Influenza A   I have reviewed the medical record, interviewed the patient and family, and examined  the patient. The following aspects are pertinent.  Past Medical History:  Diagnosis Date  . Cancer ([place])    skin ca  . CHF (congestive heart failure) ([place])   . Chronic back pain   . COPD (chronic obstructive pulmonary disease) ([place])   . Diabetes mellitus without complication ([place])   . Hypertension   . Pacemaker    2015  . Poor perfusion of leg    Social History   Socioeconomic History  . Marital status: Married    Spouse name: Not on file  . Number of children: Not on file  . Years of education: Not on file  . Highest education level: Not on file  Occupational History  . Not on file  Social Needs  . Financial resource strain: Not on file  . Food insecurity:    Worry: Not on file    Inability: Not on file  . Transportation needs:    Medical: Not on file    Non-medical: Not on file  Tobacco Use  . Smoking status: Never Smoker    . Smokeless tobacco: Never Used  Substance and Sexual Activity  . Alcohol use: No    Alcohol/week: 0.0 standard drinks  . Drug use: No  . Sexual activity: Not on file  Lifestyle  . Physical activity:    Days per week: Not on file    Minutes per session: Not on file  . Stress: Not on file  Relationships  . Social connections:    Talks on phone: Not on file    Gets together: Not on file    Attends religious service: Not on file    Active member of club or organization: Not on file    Attends meetings of clubs or organizations: Not on file    Relationship status: Not on file  Other Topics Concern  . Not on file  Social History Narrative  . Not on file   Family History  Problem Relation Age of Onset  . CVA Mother        Congestive Heart Failure, heart attack, hypertension, stroke  . Diabetes Mellitus II Sister   . Heart disease Father        Congestive Heart Failure, heart attack, hypertension   Scheduled Meds: . aspirin EC  81 mg Oral Daily  . buPROPion  150 mg Oral Daily  . carvedilol  6.25 mg Oral BID WC  . cholecalciferol  1,000 Units Oral Daily  . clopidogrel  75 mg Oral Daily  . docusate sodium  100 mg Oral BID  . escitalopram  10 mg Oral Daily  . ferrous sulfate  325 mg Oral Q breakfast  . furosemide  40 mg Oral BID  . gabapentin  100-200 mg Oral QHS  . insulin aspart  0-5 Units Subcutaneous QHS  . insulin aspart  0-9 Units Subcutaneous TID WC  . lactobacillus  1 g Oral Daily  . magnesium oxide  400 mg Oral Daily  . Melatonin  2.5 mg Oral QHS  . multivitamin with minerals  1 tablet Oral Daily  . oseltamivir  30 mg Oral BID  . pantoprazole  40 mg Oral Daily  . Rivaroxaban  15 mg Oral q1800  . rOPINIRole  0.5 mg Oral BID  . senna  1 tablet Oral Daily  . spironolactone  12.5 mg Oral Daily  . vitamin B-12  1,000 mcg Oral Daily  . vitamin C  500 mg Oral Daily   Continuous Infusions: PRN Meds:.acetaminophen, ondansetron **OR** ondansetron (  ZOFRAN) IV,  oxyCODONE Allergies  Allergen Reactions  . Ceftriaxone Swelling    Tolerates piperacillin/tazobactam, PCN G  . Cefuroxime Hives    Tolerates piperacillin/tazobactam, PCN G  . Cephalosporins Swelling and Other (See Comments)    Reaction:  Fainting/dry mouth  Tolerates piperacillin/tazobactam, PCN G  . Codeine Nausea And Vomiting  . Epinephrine Other (See Comments)    Reaction:  Fainting   . Morphine And Related Other (See Comments)    Reaction:  GI upset   . Buprenorphine Hcl Nausea And Vomiting  . Ciprofloxacin Itching, Swelling and Rash  . Latex Rash  . Lidocaine Rash  . Procaine Rash   Review of Systems  Constitutional: Positive for activity change, appetite change and fatigue.  Respiratory: Positive for shortness of breath.   Neurological: Positive for weakness.    Physical Exam Vitals signs and nursing note reviewed.  Constitutional:      Appearance: She is ill-appearing.  Cardiovascular:     Rate and Rhythm: Normal rate.  Pulmonary:     Effort: No tachypnea, accessory muscle usage or respiratory distress.     Comments: + cough Neurological:     Mental Status: She is alert and oriented to person, place, and time.     Vital Signs: BP 135/62 (BP Location: Right Arm)   Pulse 73   Temp 98.7 F (37.1 C) (Oral)   Resp 20   Ht '5\' 7"'$  (1.702 m)   Wt 80.5 kg   SpO2 93%   BMI 27.80 kg/m  Pain Scale: 0-10 POSS *See Group Information*: 1-Acceptable,Awake and alert Pain Score: 0-No pain   SpO2: SpO2: 93 % O2 Device:SpO2: 93 % O2 Flow Rate: .O2 Flow Rate (L/min): 2 L/min  IO: Intake/output summary:   Intake/Output Summary (Last 24 hours) at 07/07/2018 1348 Last data filed at 07/07/2018 0443 Gross per 24 hour  Intake 120 ml  Output 1600 ml  Net -1480 ml    LBM: Last BM Date: 07/02/18 Baseline Weight: Weight: 80.5 kg Most recent weight: Weight: 80.5 kg     Palliative Assessment/Data: 50%   Flowsheet Rows     Most Recent Value  Intake Tab  Referral  Department  Hospitalist  Unit at Time of Referral  Med/Surg Unit  Palliative Care Primary Diagnosis  Pulmonary  Date Notified  07/06/18  Palliative Care Type  New Palliative care  Reason for referral  Clarify Goals of Care  Date of Admission  07/05/18  # of days IP prior to Palliative referral  1  Clinical Assessment  Psychosocial & Spiritual Assessment  Palliative Care Outcomes      Time In: 6644 Time Out: 1400 Time Total: 75 min Greater than 50%  of this time was spent counseling and coordinating care related to the above assessment and plan.  Signed by: Vinie Sill, NP Palliative Medicine Team Pager # [phone] (M-F 8a-5p) Team Phone # [phone] (Nights/Weekends)

## 2018-07-08 LAB — MAGNESIUM: Magnesium: 2.3 mg/dL (ref 1.7–2.4)

## 2018-07-08 LAB — BASIC METABOLIC PANEL
Anion gap: 8 (ref 5–15)
BUN: 24 mg/dL — AB (ref 8–23)
CO2: 33 mmol/L — ABNORMAL HIGH (ref 22–32)
CREATININE: 0.93 mg/dL (ref 0.44–1.00)
Calcium: 8.3 mg/dL — ABNORMAL LOW (ref 8.9–10.3)
Chloride: 95 mmol/L — ABNORMAL LOW (ref 98–111)
GFR calc Af Amer: >60 mL/min (ref >60)
GFR calc non Af Amer: 54 mL/min — ABNORMAL LOW (ref >60)
Glucose, Bld: 229 mg/dL — ABNORMAL HIGH (ref 70–99)
Potassium: 3.8 mmol/L (ref 3.5–5.1)
Sodium: 136 mmol/L (ref 135–145)

## 2018-07-08 LAB — GLUCOSE, CAPILLARY
GLUCOSE-CAPILLARY: 119 mg/dL — AB (ref 70–99)
GLUCOSE-CAPILLARY: 225 mg/dL — AB (ref 70–99)
Glucose-Capillary: 157 mg/dL — ABNORMAL HIGH (ref 70–99)
Glucose-Capillary: 190 mg/dL — ABNORMAL HIGH (ref 70–99)

## 2018-07-08 MED ORDER — GUAIFENESIN-DM 100-10 MG/5ML PO SYRP
5.0000 mL | ORAL_SOLUTION | ORAL | Status: DC | PRN
  Administered 2018-07-08: 5 mL via ORAL
  Filled 2018-07-08: qty 5

## 2018-07-08 NOTE — Progress Notes (Signed)
Chaplain responded to a request for spiritual support. Pt is alert with some speech hesitation. Pt talked about her history and family. Pt told of her son who is paralyzed. Much of her story center around her son. When talking about her daughter her answers are shorter and more pointed. There seem to be some self idenification  With her daughter as he talks about herself caring for her parents and how she don't want to be a burden. When asked what her daughter says, she states " I do what I want". Pt has trouble accepting the love of her daughter. She said " its easy to love but hard to accept love" Chaplain spoke in [place] language around how [place] gave his love but we have to accept his love as well. Pt agreed. Pt needs continued support for Chaplain.    07/08/18 0900  Clinical Encounter Type  Visited With Patient  Visit Type Initial;Spiritual support  Referral From Nurse  Consult/Referral To Chaplain  Spiritual Encounters  Spiritual Needs Prayer;Emotional

## 2018-07-08 NOTE — Progress Notes (Signed)
[place] at [place] [name]    MR#:  [id number]  DATE OF BIRTH:  [birth year]  SUBJECTIVE:  CHIEF COMPLAINT:   Chief Complaint  Patient presents with  . Weakness  . Shortness of Breath   Patient still has cough, shortness of breath, she feels dizzy and generalized weakness.  She is on oxygen 2 L by nasal cannula.  REVIEW OF SYSTEMS:  Review of Systems  Constitutional: Positive for malaise/fatigue. Negative for chills and fever.  HENT: Negative for sore throat.   Eyes: Negative for blurred vision and double vision.  Respiratory: Positive for cough and shortness of breath. Negative for hemoptysis, sputum production, wheezing and stridor.   Cardiovascular: Negative for chest pain, palpitations, orthopnea and leg swelling.  Gastrointestinal: Negative for abdominal pain, blood in stool, diarrhea, melena, nausea and vomiting.  Genitourinary: Negative for dysuria, flank pain and hematuria.  Musculoskeletal: Negative for back pain and joint pain.  Skin: Negative for rash.  Neurological: Positive for dizziness. Negative for sensory change, focal weakness, seizures, loss of consciousness, weakness and headaches.  Endo/Heme/Allergies: Negative for polydipsia.  Psychiatric/Behavioral: Negative for depression. The patient is not nervous/anxious.     DRUG ALLERGIES:   Allergies  Allergen Reactions  . Ceftriaxone Swelling    Tolerates piperacillin/tazobactam, PCN G  . Cefuroxime Hives    Tolerates piperacillin/tazobactam, PCN G  . Cephalosporins Swelling and Other (See Comments)    Reaction:  Fainting/dry mouth  Tolerates piperacillin/tazobactam, PCN G  . Codeine Nausea And Vomiting  . Epinephrine Other (See Comments)    Reaction:  Fainting   . Morphine And Related Other (See Comments)    Reaction:  GI upset   . Buprenorphine Hcl Nausea And Vomiting  . Ciprofloxacin Itching, Swelling and Rash  . Latex Rash  . Lidocaine Rash  .  Procaine Rash   VITALS:  Blood pressure (!) 133/56, pulse 85, temperature 97.7 F (36.5 C), temperature source Oral, resp. rate 18, height 5\' 7"  (1.702 m), weight 80.5 kg, SpO2 94 %. PHYSICAL EXAMINATION:  Physical Exam Constitutional:      General: She is not in acute distress. HENT:     Head: Normocephalic.     Mouth/Throat:     Mouth: Mucous membranes are moist.  Eyes:     General: No scleral icterus.    Conjunctiva/sclera: Conjunctivae normal.     Pupils: Pupils are equal, round, and reactive to light.  Neck:     Musculoskeletal: Normal range of motion and neck supple.     Vascular: No JVD.     Trachea: No tracheal deviation.  Cardiovascular:     Rate and Rhythm: Normal rate and regular rhythm.     Heart sounds: Normal heart sounds. No murmur. No gallop.   Pulmonary:     Effort: Pulmonary effort is normal. No respiratory distress.     Breath sounds: No stridor. No wheezing, rhonchi or rales.     Comments: Crackles on the left side. Abdominal:     General: Bowel sounds are normal. There is no distension.     Palpations: Abdomen is soft.     Tenderness: There is no abdominal tenderness. There is no rebound.  Musculoskeletal: Normal range of motion.        General: No tenderness.     Right lower leg: No edema.     Left lower leg: No edema.  Skin:    Findings: No erythema or rash.  Neurological:  General: No focal deficit present.     Mental Status: She is alert and oriented to person, place, and time.     Cranial Nerves: No cranial nerve deficit.  Psychiatric:        Mood and Affect: Mood normal.    LABORATORY PANEL:  Female CBC Recent Labs  Lab 07/06/18 0401  WBC 6.8  HGB 9.6*  HCT 32.1*  PLT 161   ------------------------------------------------------------------------------------------------------------------ Chemistries  Recent Labs  Lab 07/05/18 0916 07/06/18 0401  NA 139 139  K 3.9 3.8  CL 96* 99  CO2 33* 31  GLUCOSE 221* 91  BUN 31* 26*    CREATININE 1.05* 1.08*  CALCIUM 8.9 8.5*  AST 14*  --   ALT 11  --   ALKPHOS 95  --   BILITOT 0.9  --    RADIOLOGY:  No results found. ASSESSMENT AND PLAN:   [name]  is a 85 y.o. female with a known history of chronic diastolic CHF with EF of 63%, chronic low back pain, COPD not on home oxygen, diabetes and hypertension who was recently discharged from the hospital 2 days ago after treated for pneumonia comes back with worsening shortness of breath and weakness.  1.  Acute hypoxic respiratory failure-secondary to acute on chronic diastolic CHF and also influenza A. Continue Lasix, spironolactone and Tamiflu, continue supplemental oxygen and wean as tolerated -Incentive spirometer. -Finished the Augmentin.  2.  Generalized weakness-physical therapy.  3.  Diabetes mellitus-hold Tradjenta and metformin.  Continue sliding scale insulin  4.  History of A. fib-status post pacemaker.  Continue cardiac medications -On Xarelto for anticoagulation  5.  Depression anxiety-continue home medications  Dizziness.  No orthostatic hypotension.  Recurrent admissions, poor prognosis. Per palliative care consult, hospice care at home or facility.  Generalized weakness.  Physical therapy. All the records are reviewed and case discussed with Care Management/Social Worker. Management plans discussed with the patient, family and they are in agreement.  CODE STATUS: DNR  TOTAL TIME TAKING CARE OF THIS PATIENT: 27 minutes.   More than 50% of the time was spent in counseling/coordination of care: YES  POSSIBLE D/C IN 2 DAYS, DEPENDING ON CLINICAL CONDITION.   Demetrios Loll M.D on 07/08/2018 at 2:26 PM  Between 7am to 6pm - Pager - [phone]  After 6pm go to [email or web address] - [facility]

## 2018-07-08 NOTE — Care Management Important Message (Signed)
Important Message  Patient Details  [name] MRN: [id number] Date of Birth: 1924-06-29   Medicare Important Message Given:  Yes    Su Hilt, RN 07/08/2018, 2:20 PM

## 2018-07-08 NOTE — Progress Notes (Signed)
Palliative:  I met again today with [name] but no family/visitors at bedside. [name] thought I was therapy and was prepared to work to get out of bed. I explained that I am with palliative care and not therapy and she remembered me after the reminder. [name] tells me that she does not feel good today and that she feels dizzy. I asked if her dizziness is new but she says this happens sometimes at home. Of note, she is reading a newspaper when I come into the room.   I spoke more with [name] about her options and aggressiveness of care: mainly if she desires rehab or to return home. She again goes back and forth but settles by telling me that rehab is already in the works and she better follow the plan. I did explain that if she wants to go home that [facility] assures me that they can work out so she has caregivers and help at home and that we would add hospice care if she truly wants to focus on comfort care and no longer pursue aggressive care and rehospitalization.   Recommend palliative to follow at SNF to assist [name] and her daughter with GOC and plans from SNF.   I called to update daughter, [name], but no answer. I left voicemail informing her of continued desire for rehab.   Exam: Alert, oriented. No distress.   Plan: - SNF rehab with palliative to follow. Please recommend outpatient palliative in d/c summary.   15 min  Vinie Sill, NP Palliative Medicine Team Pager # [phone] (M-F 8a-5p) Team Phone # [phone] (Nights/Weekends)

## 2018-07-08 NOTE — Progress Notes (Addendum)
Patient is c/o being lightheaded and dizzy while lying in bed. BP 133/56, HR 85. Blood sugar 225. Notified MD. New orders to obtain orthostatic BP  Patient refuses to get OOB. BP sitting up in bed 130/50. Notified MD.

## 2018-07-08 NOTE — Progress Notes (Signed)
Plan is for patient to D/C to [facility]. Per [facility]tila [facility] liaison [facility] SNF authorization is still pending. Patient's daughter [name] is aware of above.  [employer], LCSW [phone]

## 2018-07-09 LAB — CBC
HCT: 33.3 % — ABNORMAL LOW (ref 36.0–46.0)
Hemoglobin: 10.1 g/dL — ABNORMAL LOW (ref 12.0–15.0)
MCH: 28.7 pg (ref 26.0–34.0)
MCHC: 30.3 g/dL (ref 30.0–36.0)
MCV: 94.6 fL (ref 80.0–100.0)
Platelets: 152 10*3/uL (ref 150–400)
RBC: 3.52 MIL/uL — ABNORMAL LOW (ref 3.87–5.11)
RDW: 14.8 % (ref 11.5–15.5)
WBC: 5.5 10*3/uL (ref 4.0–10.5)
nRBC: 0 % (ref 0.0–0.2)

## 2018-07-09 LAB — GLUCOSE, CAPILLARY
Glucose-Capillary: 132 mg/dL — ABNORMAL HIGH (ref 70–99)
Glucose-Capillary: 270 mg/dL — ABNORMAL HIGH (ref 70–99)

## 2018-07-09 MED ORDER — OXYCODONE HCL 5 MG PO CAPS: 5.0000 mg | ORAL_CAPSULE | Freq: Four times a day (QID) | ORAL | 0 refills | Status: DC | PRN

## 2018-07-09 MED ORDER — FUROSEMIDE 40 MG PO TABS: 40.0000 mg | ORAL_TABLET | Freq: Two times a day (BID) | ORAL | 2 refills | Status: DC

## 2018-07-09 MED ORDER — POTASSIUM CHLORIDE CRYS ER 20 MEQ PO TBCR
20.0000 meq | EXTENDED_RELEASE_TABLET | Freq: Once | ORAL | Status: AC
  Administered 2018-07-09: 20 meq via ORAL
  Filled 2018-07-09: qty 1

## 2018-07-09 MED ORDER — FUROSEMIDE 10 MG/ML IJ SOLN
40.0000 mg | Freq: Once | INTRAMUSCULAR | Status: DC
  Filled 2018-07-09: qty 4

## 2018-07-09 MED ORDER — SENNA 8.6 MG PO TABS: 1.0000 | ORAL_TABLET | Freq: Every day | ORAL | 0 refills | Status: AC

## 2018-07-09 MED ORDER — OSELTAMIVIR PHOSPHATE 30 MG PO CAPS: 30.0000 mg | ORAL_CAPSULE | Freq: Two times a day (BID) | ORAL | 0 refills | Status: AC

## 2018-07-09 MED ORDER — IPRATROPIUM-ALBUTEROL 0.5-2.5 (3) MG/3ML IN SOLN: 3.0000 mL | Freq: Four times a day (QID) | RESPIRATORY_TRACT | 0 refills | Status: AC | PRN

## 2018-07-09 MED ORDER — FUROSEMIDE 40 MG PO TABS
40.0000 mg | ORAL_TABLET | Freq: Once | ORAL | Status: AC
  Administered 2018-07-09: 40 mg via ORAL
  Filled 2018-07-09: qty 1

## 2018-07-09 NOTE — Discharge Summary (Signed)
[facility] [name]    MR#:  [id number]  DATE OF BIRTH:  1924/12/14  DATE OF ADMISSION:  07/05/2018   ADMITTING PHYSICIAN: Gladstone Lighter, MD  DATE OF DISCHARGE:  07/09/2018  PRIMARY CARE PHYSICIAN: Tracie Harrier, MD   ADMISSION DIAGNOSIS:   SOB (shortness of breath) [R06.02] Hypoxia [R09.02]  DISCHARGE DIAGNOSIS:   Active Problems:   Influenza A   Goals of care, counseling/discussion   Palliative care encounter   SECONDARY DIAGNOSIS:   Past Medical History:  Diagnosis Date  . Cancer ([place])    skin ca  . CHF (congestive heart failure) ([place])   . Chronic back pain   . COPD (chronic obstructive pulmonary disease) ([place])   . Diabetes mellitus without complication ([place])   . Hypertension   . Pacemaker    2015  . Poor perfusion of leg     HOSPITAL COURSE:   Kimbrely Buckel  is a 85 y.o. female with a known history of chronic diastolic CHF with EF of 08%, chronic low back pain, COPD not on home oxygen, diabetes and hypertension who was recently discharged from the hospital 2 days ago after treated for pneumonia comes back with worsening shortness of breath and weakness.  1.  Acute hypoxic respiratory failure-secondary to mild diastolic CHF exacerbation and also influenza illness - on oral lasix bid now- received an extra dose IV prior to discharge - on 2L o2 now -Started Tamiflu for influenza -Incentive spirometer recommended -Finished off the Augmentin, for recent pneumonia - duonebs prn for wheezing  2.  Generalized weakness-physical therapy consulted- rehab at discharge  3.  Diabetes mellitus-continue glimiperide, januvia and metformin.  4.  History of A. fib-status post pacemaker.  Continue cardiac medications -On Xarelto for anticoagulation  5.  Depression anxiety-continue home medications  6. CHF- diastolic CHF based on ECHO from June 2019- continue cardiac meds - outpt f/u with  cardiology and repeat ECHO recommended - low sodium diet, input and out monitoring and daily weights  Physical therapy consulted- needs rehab at discharge Overall poor prognosis due to multiple medical problems and recurrent admissions   DISCHARGE CONDITIONS:   Guarded  CONSULTS OBTAINED:   None  DRUG ALLERGIES:   Allergies  Allergen Reactions  . Ceftriaxone Swelling    Tolerates piperacillin/tazobactam, PCN G  . Cefuroxime Hives    Tolerates piperacillin/tazobactam, PCN G  . Cephalosporins Swelling and Other (See Comments)    Reaction:  Fainting/dry mouth  Tolerates piperacillin/tazobactam, PCN G  . Codeine Nausea And Vomiting  . Epinephrine Other (See Comments)    Reaction:  Fainting   . Morphine And Related Other (See Comments)    Reaction:  GI upset   . Buprenorphine Hcl Nausea And Vomiting  . Ciprofloxacin Itching, Swelling and Rash  . Latex Rash  . Lidocaine Rash  . Procaine Rash   DISCHARGE MEDICATIONS:   Allergies as of 07/09/2018      Reactions   Ceftriaxone Swelling   Tolerates piperacillin/tazobactam, PCN G   Cefuroxime Hives   Tolerates piperacillin/tazobactam, PCN G   Cephalosporins Swelling, Other (See Comments)   Reaction:  Fainting/dry mouth  Tolerates piperacillin/tazobactam, PCN G   Codeine Nausea And Vomiting   Epinephrine Other (See Comments)   Reaction:  Fainting    Morphine And Related Other (See Comments)   Reaction:  GI upset    Buprenorphine Hcl Nausea And Vomiting   Ciprofloxacin Itching, Swelling, Rash   Latex Rash  Lidocaine Rash   Procaine Rash      Medication List    STOP taking these medications   amoxicillin-clavulanate 875-125 MG tablet Commonly known as:  AUGMENTIN     TAKE these medications   acetaminophen 325 MG tablet Commonly known as:  TYLENOL Take 2 tablets (650 mg total) by mouth every 6 (six) hours as needed for mild pain (or Fever >/= 101).   aspirin EC 81 MG tablet Take 81 mg by mouth daily.     baclofen 10 MG tablet Commonly known as:  LIORESAL Take 10 mg by mouth 3 (three) times daily as needed for muscle spasms.   buPROPion 150 MG 24 hr tablet Commonly known as:  WELLBUTRIN XL Take 150 mg by mouth daily.   carvedilol 6.25 MG tablet Commonly known as:  COREG Take 6.25 mg by mouth 2 (two) times daily with a meal.   cholecalciferol 1000 units tablet Commonly known as:  VITAMIN D Take 1,000 Units by mouth daily.   clopidogrel 75 MG tablet Commonly known as:  PLAVIX Take 75 mg by mouth daily.   diclofenac sodium 1 % Gel Commonly known as:  VOLTAREN Apply 2 g topically 4 (four) times daily.   docusate sodium 100 MG capsule Commonly known as:  COLACE Take 100 mg by mouth 2 (two) times daily.   escitalopram 10 MG tablet Commonly known as:  LEXAPRO Take 10 mg by mouth daily.   ferrous sulfate 325 (65 FE) MG tablet Take 325 mg by mouth daily with breakfast.   furosemide 40 MG tablet Commonly known as:  LASIX Take 1 tablet (40 mg total) by mouth 2 (two) times daily. What changed:    when to take this  additional instructions   gabapentin 100 MG capsule Commonly known as:  NEURONTIN Take 100-200 mg by mouth at bedtime.   glimepiride 4 MG tablet Commonly known as:  AMARYL Take 4 mg by mouth 2 (two) times daily.   ipratropium-albuterol 0.5-2.5 (3) MG/3ML Soln Commonly known as:  DUONEB Take 3 mLs by nebulization every 6 (six) hours as needed (wheezing).   JANUVIA 100 MG tablet Generic drug:  sitaGLIPtin Take 100 mg by mouth daily.   lactobacillus Pack Take 1 g by mouth daily.   magnesium oxide 400 MG tablet Commonly known as:  MAG-OX Take 400 mg by mouth daily.   Melatonin 1 MG Tabs Take 1 mg by mouth at bedtime.   metFORMIN 500 MG tablet Commonly known as:  GLUCOPHAGE Take 1 tablet by mouth daily.   multivitamin tablet Take 1 tablet by mouth daily.   oseltamivir 30 MG capsule Commonly known as:  TAMIFLU Take 1 capsule (30 mg total) by  mouth 2 (two) times daily for 1 day.   oxycodone 5 MG capsule Commonly known as:  OXY-IR Take 1-2 capsules (5-10 mg total) by mouth every 6 (six) hours as needed (MOD SEVERE pain).   pantoprazole 40 MG tablet Commonly known as:  PROTONIX Take 40 mg by mouth daily.   PROAIR HFA 108 (90 Base) MCG/ACT inhaler Generic drug:  albuterol Inhale 2 puffs into the lungs every 6 (six) hours as needed for wheezing or shortness of breath.   Rivaroxaban 15 MG Tabs tablet Commonly known as:  XARELTO Take 15 mg by mouth daily.   rOPINIRole 0.5 MG tablet Commonly known as:  REQUIP Take 0.5 mg by mouth 2 (two) times daily.   senna 8.6 MG Tabs tablet Commonly known as:  SENOKOT Take 1 tablet (  8.6 mg total) by mouth daily. Start taking on:  July 10, 2018   spironolactone 25 MG tablet Commonly known as:  ALDACTONE Take 0.5 tablets (12.5 mg total) by mouth daily.   vitamin B-12 1000 MCG tablet Commonly known as:  CYANOCOBALAMIN Take 1,000 mcg by mouth daily.   vitamin C 500 MG tablet Commonly known as:  ASCORBIC ACID Take 500 mg by mouth daily.   Vitamin D (Ergocalciferol) 1.25 MG (50000 UT) Caps capsule Commonly known as:  DRISDOL Take 50,000 Units by mouth every 7 (seven) days. Pt takes on Thursday.        DISCHARGE INSTRUCTIONS:   1.  PCP follow-up in 1 to 2 weeks 2.  Cardiology follow-up in 2 weeks 3.   palliative care follow-up  DIET:   Cardiac diet  ACTIVITY:   Activity as tolerated  OXYGEN:   Home Oxygen: Yes.    Oxygen Delivery: 2 liters/min via Patient connected to nasal cannula oxygen  DISCHARGE LOCATION:   nursing home   If you experience worsening of your admission symptoms, develop shortness of breath, life threatening emergency, suicidal or homicidal thoughts you must seek medical attention immediately by calling 911 or calling your MD immediately  if symptoms less severe.  You Must read complete instructions/literature along with all the possible  adverse reactions/side effects for all the Medicines you take and that have been prescribed to you. Take any new Medicines after you have completely understood and accpet all the possible adverse reactions/side effects.   Please note  You were cared for by a hospitalist during your hospital stay. If you have any questions about your discharge medications or the care you received while you were in the hospital after you are discharged, you can call the unit and asked to speak with the hospitalist on call if the hospitalist that took care of you is not available. Once you are discharged, your primary care physician will handle any further medical issues. Please note that NO REFILLS for any discharge medications will be authorized once you are discharged, as it is imperative that you return to your primary care physician (or establish a relationship with a primary care physician if you do not have one) for your aftercare needs so that they can reassess your need for medications and monitor your lab values.    On the day of Discharge:  VITAL SIGNS:   Blood pressure (!) 145/68, pulse 73, temperature 97.8 F (36.6 C), temperature source Oral, resp. rate 18, height 5\' 7"  (1.702 m), weight 80.5 kg, SpO2 99 %.  PHYSICAL EXAMINATION:    GENERAL:  85 y.o.-year-old patient lying in the bed, appears frail EYES: Pupils equal, round, reactive to light and accommodation. No scleral icterus. Extraocular muscles intact.  HEENT: Head atraumatic, normocephalic. Oropharynx and nasopharynx clear.  NECK:  Supple, no jugular venous distention. No thyroid enlargement, no tenderness.  LUNGS: coarse breath sounds bilaterally.some scattered wheezes at the bases. No rales or crepitations. No use of accessory muscles of respiration.  CARDIOVASCULAR: S1, S2 normal. No murmurs, rubs, or gallops.  ABDOMEN: Soft, non-tender, non-distended. Bowel sounds present. No organomegaly or mass.  EXTREMITIES: No pedal edema, cyanosis,  or clubbing.  NEUROLOGIC: Cranial nerves II through XII are intact. Muscle strength 5/5 in all extremities. Sensation intact. Gait not checked. Global weakness noted. PSYCHIATRIC: The patient is alert and oriented x 3.  SKIN: No obvious rash, lesion, or ulcer.   DATA REVIEW:   CBC Recent Labs  Lab 07/09/18 1016  WBC 5.5  HGB 10.1*  HCT 33.3*  PLT 152    Chemistries  Recent Labs  Lab 07/05/18 0916  07/08/18 1451  NA 139   < > 136  K 3.9   < > 3.8  CL 96*   < > 95*  CO2 33*   < > 33*  GLUCOSE 221*   < > 229*  BUN 31*   < > 24*  CREATININE 1.05*   < > 0.93  CALCIUM 8.9   < > 8.3*  MG  --   --  2.3  AST 14*  --   --   ALT 11  --   --   ALKPHOS 95  --   --   BILITOT 0.9  --   --    < > = values in this interval not displayed.     Microbiology Results  Results for orders placed or performed during the hospital encounter of 07/05/18  Culture, blood (routine x 2)     Status: None (Preliminary result)   Collection Time: 07/05/18  9:17 AM  Result Value Ref Range Status   Specimen Description BLOOD RAC  Final   Special Requests   Final    BOTTLES DRAWN AEROBIC AND ANAEROBIC Blood Culture results may not be optimal due to an excessive volume of blood received in culture bottles   Culture   Final    NO GROWTH 4 DAYS Performed at [facility], [address] [place], [place] [address]    Report Status PENDING  Incomplete  Culture, blood (routine x 2)     Status: None (Preliminary result)   Collection Time: 07/05/18  9:17 AM  Result Value Ref Range Status   Specimen Description BLOOD RFA  Final   Special Requests   Final    BOTTLES DRAWN AEROBIC AND ANAEROBIC Blood Culture adequate volume   Culture   Final    NO GROWTH 4 DAYS Performed at [facility], [address] [place], [place] [address]    Report Status PENDING  Incomplete    RADIOLOGY:  No results found.   Management plans discussed with the patient, family and they are in  agreement.  CODE STATUS:     Code Status Orders  (From admission, onward)         Start     Ordered   07/06/18 1312  Do not attempt resuscitation (DNR)  Continuous    Question Answer Comment  In the event of cardiac or respiratory ARREST Do not call a "code blue"   In the event of cardiac or respiratory ARREST Do not perform Intubation, CPR, defibrillation or ACLS   In the event of cardiac or respiratory ARREST Use medication by any route, position, wound care, and other measures to relive pain and suffering. May use oxygen, suction and manual treatment of airway obstruction as needed for comfort.      07/06/18 1311        Code Status History    Date Active Date Inactive Code Status Order ID Comments User Context   07/05/2018 1337 07/06/2018 1311 Full Code [id number]  Gladstone Lighter, MD Inpatient   06/30/2018 0352 07/03/2018 1557 DNR [id number]  Gorden Harms, MD ED   06/14/2018 1501 06/16/2018 1634 DNR [id number]  Epifanio Lesches, MD ED   06/02/2018 0138 06/03/2018 1933 DNR [id number]  Arta Silence, MD ED   05/17/2018 0118 05/18/2018 1826 DNR [id number]  Amelia Jo, MD Inpatient   12/11/2017 2059 2020/09/717 2127  DNR [id number]  Hillary Bow, MD ED   07/15/2017 1830 07/19/2017 1845 DNR [id number]  Gorden Harms, MD Inpatient   04/14/2017 0344 04/18/2017 2138 Full Code [id number]  Lance Coon, MD Inpatient   12/08/2016 0107 12/09/2016 2031 Full Code [id number]  Lance Coon, MD ED   09/07/2015 0441 09/13/2015 1955 Full Code [id number]  Saundra Shelling, MD Inpatient   09/01/2015 1533 09/03/2015 1907 Full Code [id number]  Epifanio Lesches, MD ED   03/21/2015 0626 03/22/2015 1939 Full Code [id number]  Harrie Foreman, MD Inpatient    Advance Directive Documentation     Most Recent Value  Type of Advance Directive  Healthcare Power of Attorney, Living will  Pre-existing out of facility DNR order (yellow form or pink MOST form)  -  "MOST" Form in Place?  -      TOTAL  TIME TAKING CARE OF THIS PATIENT: 38 minutes.    Gladstone Lighter M.D on 07/09/2018 at 11:57 AM  Between 7am to 6pm - Pager - [phone]  After 6pm go to [email or web address] - Proofreader  [facility]  Office  [phone]  CC: Primary care physician; Tracie Harrier, MD   Note: This dictation was prepared with Dragon dictation along with smaller phrase technology. Any transcriptional errors that result from this process are unintentional.

## 2018-07-09 NOTE — Discharge Summary (Signed)
[facility] [place] [name]    MR#:  [id number]  DATE OF BIRTH:  February 27, 1925  DATE OF ADMISSION:  06/29/2018 ADMITTING PHYSICIAN: Gorden Harms, MD  DATE OF DISCHARGE: 07/01/2018  2:15 PM  PRIMARY CARE PHYSICIAN: Tracie Harrier, MD    ADMISSION DIAGNOSIS:  Acute respiratory failure with hypoxia ([place]) [J96.01] Hospital acquired PNA [J18.9, Y95]  DISCHARGE DIAGNOSIS:  Active Problems:   CAP (community acquired pneumonia)   SECONDARY DIAGNOSIS:   Past Medical History:  Diagnosis Date  . Cancer ([place])    skin ca  . CHF (congestive heart failure) ([place])   . Chronic back pain   . COPD (chronic obstructive pulmonary disease) ([place])   . Diabetes mellitus without complication ([place])   . Hypertension   . Pacemaker    2015  . Poor perfusion of leg     HOSPITAL COURSE:   * Acute HCAP  Recent hospitalization for heart failure 12 days ago   empiric Zosyn/azithromycin/vancomycin-pharmacy to dose, follow-up on cultures  MRSA PCR negative- d/c vanc.  Flu test negative.  Deascalate Abx as pt is improving.   able to wean to room air.  * Acute hyperglycemia with chronic diabetes mellitus type 2  Blood sugars greater than 400 over the last several days with associated nausea/emesis, last bowel movement was yesterday  Continue home regiment, sliding scale insulin with Accu-Cheks per routine, Lantus 20 units at bedtime x1 now, 8.7 hemoglobin A1c - poor control control, antiemetics PRN  *Chronic diastolic congestive heart failure without exacerbation  Continue aspirin, Coreg, Lasix, spironolactone  *Chronic pain syndrome Stable on home regiment which will be continued  *Chronic hypoxic respiratory failure Stable On 2 L via nasal cannula at night  *Chronic benign essential hypertension Stable on home regiment which will be continued  * Chronic A fib- s/p ablation and s/p pacemaker placement,   Cont coreg,  Xarelto.  * Hx of CAD and stent in LAD   Cont ASA and plavix? As per PMD  ( PA) note last month- she was on ASA+ Plavix and also on xarelto. I will let PMD clarify that with cardiologist.  She is improving, PT suggested SNF but pt feels strongly to go home. She have personal care taker at home, husband is very hard of hearing and needs her to be there also. She had PT. OT. RN visiting, and said that setting was better than going to rehab as she can get up and move with a walker. Husband also want her to come home, On the day of discharge , she was able to walk up out of room with a walker.  DISCHARGE CONDITIONS:   Stable.  CONSULTS OBTAINED:    DRUG ALLERGIES:   Allergies  Allergen Reactions  . Ceftriaxone Swelling    Tolerates piperacillin/tazobactam, PCN G  . Cefuroxime Hives    Tolerates piperacillin/tazobactam, PCN G  . Cephalosporins Swelling and Other (See Comments)    Reaction:  Fainting/dry mouth  Tolerates piperacillin/tazobactam, PCN G  . Codeine Nausea And Vomiting  . Epinephrine Other (See Comments)    Reaction:  Fainting   . Morphine And Related Other (See Comments)    Reaction:  GI upset   . Buprenorphine Hcl Nausea And Vomiting  . Ciprofloxacin Itching, Swelling and Rash  . Latex Rash  . Lidocaine Rash  . Procaine Rash    DISCHARGE MEDICATIONS:   Allergies as of 07/01/2018      Reactions  Ceftriaxone Swelling   Tolerates piperacillin/tazobactam, PCN G   Cefuroxime Hives   Tolerates piperacillin/tazobactam, PCN G   Cephalosporins Swelling, Other (See Comments)   Reaction:  Fainting/dry mouth  Tolerates piperacillin/tazobactam, PCN G   Codeine Nausea And Vomiting   Epinephrine Other (See Comments)   Reaction:  Fainting    Morphine And Related Other (See Comments)   Reaction:  GI upset    Buprenorphine Hcl Nausea And Vomiting   Ciprofloxacin Itching, Swelling, Rash   Latex Rash   Lidocaine Rash   Procaine Rash      Medication List    STOP  taking these medications   furosemide 20 MG tablet Commonly known as:  LASIX     TAKE these medications   acetaminophen 325 MG tablet Commonly known as:  TYLENOL Take 2 tablets (650 mg total) by mouth every 6 (six) hours as needed for mild pain (or Fever >/= 101).   aspirin EC 81 MG tablet Take 81 mg by mouth daily.   baclofen 10 MG tablet Commonly known as:  LIORESAL Take 10 mg by mouth 3 (three) times daily as needed for muscle spasms.   buPROPion 150 MG 24 hr tablet Commonly known as:  WELLBUTRIN XL Take 150 mg by mouth daily.   carvedilol 6.25 MG tablet Commonly known as:  COREG Take 6.25 mg by mouth 2 (two) times daily with a meal.   cholecalciferol 1000 units tablet Commonly known as:  VITAMIN D Take 1,000 Units by mouth daily.   clopidogrel 75 MG tablet Commonly known as:  PLAVIX Take 75 mg by mouth daily.   diclofenac sodium 1 % Gel Commonly known as:  VOLTAREN Apply 2 g topically 4 (four) times daily.   docusate sodium 100 MG capsule Commonly known as:  COLACE Take 100 mg by mouth 2 (two) times daily.   escitalopram 10 MG tablet Commonly known as:  LEXAPRO Take 10 mg by mouth daily.   ferrous sulfate 325 (65 FE) MG tablet Take 325 mg by mouth daily with breakfast.   gabapentin 100 MG capsule Commonly known as:  NEURONTIN Take 100-200 mg by mouth at bedtime.   glimepiride 4 MG tablet Commonly known as:  AMARYL Take 4 mg by mouth 2 (two) times daily.   JANUVIA 100 MG tablet Generic drug:  sitaGLIPtin Take 100 mg by mouth daily.   lactobacillus Pack Take 1 g by mouth daily.   magnesium oxide 400 MG tablet Commonly known as:  MAG-OX Take 400 mg by mouth daily.   Melatonin 1 MG Tabs Take 1 mg by mouth at bedtime.   metFORMIN 500 MG tablet Commonly known as:  GLUCOPHAGE Take 1 tablet by mouth daily.   multivitamin tablet Take 1 tablet by mouth daily.   pantoprazole 40 MG tablet Commonly known as:  PROTONIX Take 40 mg by mouth daily.    PROAIR HFA 108 (90 Base) MCG/ACT inhaler Generic drug:  albuterol Inhale 2 puffs into the lungs every 6 (six) hours as needed for wheezing or shortness of breath.   Rivaroxaban 15 MG Tabs tablet Commonly known as:  XARELTO Take 15 mg by mouth daily.   rOPINIRole 0.5 MG tablet Commonly known as:  REQUIP Take 0.5 mg by mouth 2 (two) times daily.   spironolactone 25 MG tablet Commonly known as:  ALDACTONE Take 0.5 tablets (12.5 mg total) by mouth daily.   vitamin B-12 1000 MCG tablet Commonly known as:  CYANOCOBALAMIN Take 1,000 mcg by mouth daily.   vitamin  C 500 MG tablet Commonly known as:  ASCORBIC ACID Take 500 mg by mouth daily.   Vitamin D (Ergocalciferol) 1.25 MG (50000 UT) Caps capsule Commonly known as:  DRISDOL Take 50,000 Units by mouth every 7 (seven) days. Pt takes on Thursday.     ASK your doctor about these medications   Insulin Pen Needle 31G X 5 MM Misc 4 Syringes by Does not apply route 4 (four) times daily -  before meals and at bedtime. Use 3 times daily with meals and at bedtime as needed Ask about: Should I take this medication?        DISCHARGE INSTRUCTIONS:    Follow with PMD in 1 week.  If you experience worsening of your admission symptoms, develop shortness of breath, life threatening emergency, suicidal or homicidal thoughts you must seek medical attention immediately by calling 911 or calling your MD immediately  if symptoms less severe.  You Must read complete instructions/literature along with all the possible adverse reactions/side effects for all the Medicines you take and that have been prescribed to you. Take any new Medicines after you have completely understood and accept all the possible adverse reactions/side effects.   Please note  You were cared for by a hospitalist during your hospital stay. If you have any questions about your discharge medications or the care you received while you were in the hospital after you are  discharged, you can call the unit and asked to speak with the hospitalist on call if the hospitalist that took care of you is not available. Once you are discharged, your primary care physician will handle any further medical issues. Please note that NO REFILLS for any discharge medications will be authorized once you are discharged, as it is imperative that you return to your primary care physician (or establish a relationship with a primary care physician if you do not have one) for your aftercare needs so that they can reassess your need for medications and monitor your lab values.    Today   CHIEF COMPLAINT:   Chief Complaint  Patient presents with  . Nausea  . Emesis    HISTORY OF PRESENT ILLNESS:  [name]  is a 85 y.o. female with recent hospital discharge for heart failure 1012 days ago, presenting from home with high blood sugars greater than 400 for 3 days, associated nausea, emesis, in the emergency room patient was noted to be hypoxic, tachypneic, white count 11,000, glucose 281, CT abdomen noted for right-sided pneumonia, chest x-ray was negative, patient evaluated in the emergency room, no apparent distress, patient evaluated for acute H CAP, hyperglycemia with history of diabetes mellitus type 2, nausea and emesis.  VITAL SIGNS:  Blood pressure 135/63, pulse 76, temperature 98.6 F (37 C), temperature source Oral, resp. rate 16, height 5\' 7"  (1.702 m), weight 80.6 kg, SpO2 90 %.  I/O:  No intake or output data in the 24 hours ending 07/09/18 1818  PHYSICAL EXAMINATION:  GENERAL:  85 y.o.-year-old patient lying in the bed with no acute distress.  EYES: Pupils equal, round, reactive to light and accommodation. No scleral icterus. Extraocular muscles intact.  HEENT: Head atraumatic, normocephalic. Oropharynx and nasopharynx clear.  NECK:  Supple, no jugular venous distention. No thyroid enlargement, no tenderness.  LUNGS: Normal breath sounds bilaterally, no wheezing, Some  crepitation. No use of accessory muscles of respiration.  CARDIOVASCULAR: S1, S2 normal. No murmurs, rubs, or gallops.  ABDOMEN: Soft, nontender, nondistended. Bowel sounds present. No organomegaly or mass.  EXTREMITIES: No pedal edema, cyanosis, or clubbing.  NEUROLOGIC: Cranial nerves II through XII are intact. Muscle strength 3-4/5 in all extremities. Sensation intact. Gait not checked.  PSYCHIATRIC: The patient is alert and oriented x 3.  SKIN: No obvious rash, lesion, or ulcer.   DATA REVIEW:   CBC Recent Labs  Lab 07/09/18 1016  WBC 5.5  HGB 10.1*  HCT 33.3*  PLT 152    Chemistries  Recent Labs  Lab 07/05/18 0916  07/08/18 1451  NA 139   < > 136  K 3.9   < > 3.8  CL 96*   < > 95*  CO2 33*   < > 33*  GLUCOSE 221*   < > 229*  BUN 31*   < > 24*  CREATININE 1.05*   < > 0.93  CALCIUM 8.9   < > 8.3*  MG  --   --  2.3  AST 14*  --   --   ALT 11  --   --   ALKPHOS 95  --   --   BILITOT 0.9  --   --    < > = values in this interval not displayed.    Cardiac Enzymes Recent Labs  Lab 07/05/18 0916  TROPONINI 0.03*    Microbiology Results  Results for orders placed or performed during the hospital encounter of 06/29/18  Blood Culture (routine x 2)     Status: None   Collection Time: 06/29/18 11:19 PM  Result Value Ref Range Status   Specimen Description BLOOD BLOOD LEFT HAND  Final   Special Requests   Final    BOTTLES DRAWN AEROBIC AND ANAEROBIC Blood Culture adequate volume   Culture   Final    NO GROWTH 5 DAYS Performed at [facility], [address] [place], [place] [address]    Report Status 07/05/2018 FINAL  Final  Blood Culture (routine x 2)     Status: None   Collection Time: 06/29/18 11:19 PM  Result Value Ref Range Status   Specimen Description BLOOD RIGHT ANTECUBITAL  Final   Special Requests   Final    BOTTLES DRAWN AEROBIC AND ANAEROBIC Blood Culture results may not be optimal due to an excessive volume of blood received in culture  bottles   Culture   Final    NO GROWTH 5 DAYS Performed at [facility], [address] [place], [place] [address]    Report Status 07/05/2018 FINAL  Final  MRSA PCR Screening     Status: None   Collection Time: 06/30/18 11:33 AM  Result Value Ref Range Status   MRSA by PCR NEGATIVE NEGATIVE Final    Comment:        The GeneXpert MRSA Assay (FDA approved for NASAL specimens only), is one component of a comprehensive MRSA colonization surveillance program. It is not intended to diagnose MRSA infection nor to guide or monitor treatment for MRSA infections. Performed at [facility], [address] [place],  [address]     RADIOLOGY:  No results found.  EKG:   Orders placed or performed during the hospital encounter of 06/29/18  . ED EKG  . ED EKG      Management plans discussed with the patient, family and they are in agreement.  CODE STATUS:  Code Status History    Date Active Date Inactive Code Status Order ID Comments User Context   07/05/2018 1337 07/06/2018 1311 Full Code [id number]  Gladstone Lighter, MD Inpatient  06/30/2018 0352 07/03/2018 1557 DNR [id number]  Gorden Harms, MD ED   06/14/2018 1501 06/16/2018 1634 DNR [id number]  Epifanio Lesches, MD ED   06/02/2018 0138 06/03/2018 1933 DNR [id number]  Arta Silence, MD ED   05/17/2018 0118 05/18/2018 1826 DNR [id number]  Amelia Jo, MD Inpatient   12/11/2017 2059 03-11-202019 2127 DNR [id number]  Hillary Bow, MD ED   07/15/2017 1830 07/19/2017 1845 DNR [id number]  Gorden Harms, MD Inpatient   04/14/2017 0344 04/18/2017 2138 Full Code [id number]  Lance Coon, MD Inpatient   12/08/2016 0107 12/09/2016 2031 Full Code [id number]  Lance Coon, MD ED   09/07/2015 0441 09/13/2015 1955 Full Code [id number]  Saundra Shelling, MD Inpatient   09/01/2015 1533 09/03/2015 1907 Full Code [id number]  Epifanio Lesches, MD ED   03/21/2015 0626 03/22/2015 1939 Full Code [id number]  Harrie Foreman, MD Inpatient    Advance Directive Documentation     Most Recent Value  Type of Advance Directive  Healthcare Power of Attorney  Pre-existing out of facility DNR order (yellow form or pink MOST form)  -  "MOST" Form in Place?  -      TOTAL TIME TAKING CARE OF THIS PATIENT: 35 minutes.    Vaughan Basta M.D on 07/09/2018 at 6:18 PM  Between 7am to 6pm - Pager - [phone]  After 6pm go to [email or web address] - password [email or web address] [facility]  Office  [phone]  CC: Primary care physician; Tracie Harrier, MD   Note: This dictation was prepared with Dragon dictation along with smaller phrase technology. Any transcriptional errors that result from this process are unintentional.

## 2018-07-09 NOTE — Progress Notes (Signed)
Patient is medically stable for D/C to [facility] today. Per [facility] authorization has been received and patient can come today to room 709. RN will call report and arrange EMS for transport. Patient will have outpatient palliative to follow at [facility]. [facility] liaison is aware of above. Clinical Education officer, museum (CSW) sent D/C orders to [facility] via [facility]. Patient is aware of above. CSW contacted patient's daughter [name] and made her aware of above. Please reconsult if future work needs arise. CSW signing off.   [employer], LCSW [phone]

## 2018-07-09 NOTE — Progress Notes (Signed)
Report called to [place] @ [facility].

## 2018-07-09 NOTE — Progress Notes (Signed)
PT has IV Lasix 40 mg X1, Pt loss IV access, pt is to d/c today. MD [facility] notified. Orders received to change from IV to Lasix 40 mg PO. Order placed

## 2018-07-09 NOTE — Progress Notes (Signed)
EMS called for transportation.  

## 2018-07-09 NOTE — Progress Notes (Signed)
EMS here to transport pt. Pt on 2 L oxygen. Pts daughter [name] notified.

## 2018-07-09 NOTE — Clinical Social Work Placement (Signed)
   CLINICAL SOCIAL WORK PLACEMENT  NOTE  Date:  07/09/2018  Patient Details  [name] MRN: [id number] Date of Birth: 03-21-1925  Clinical Social Work is seeking post-discharge placement for this patient at the [place] level of care (*CSW will initial, date and re-position this form in  chart as items are completed):  Yes   Patient/family provided with [facility] Work Department's list of facilities offering this level of care within the geographic area requested by the patient (or if unable, by the patient's family).  Yes   Patient/family informed of their freedom to choose among providers that offer the needed level of care, that participate in Medicare, Medicaid or managed care program needed by the patient, have an available bed and are willing to accept the patient.  Yes   Patient/family informed of [facility]'s ownership interest in [facility] and [facility], as well as of the fact that they are under no obligation to receive care at these facilities.  PASRR submitted to EDS on       PASRR number received on       Existing PASRR number confirmed on 07/06/18     FL2 transmitted to all facilities in geographic area requested by pt/family on 07/06/18     FL2 transmitted to all facilities within larger geographic area on       Patient informed that his/her managed care company has contracts with or will negotiate with certain facilities, including the following:        Yes   Patient/family informed of bed offers received.  Patient chooses bed at ([facility] )     Physician recommends and patient chooses bed at      Patient to be transferred to ([facility] ) on 07/09/18.  Patient to be transferred to facility by [facility] )     Patient family notified on 07/09/18 of transfer.  Name of family member notified:  (Patient's daughter [name] is aware of D/C today. )     PHYSICIAN       Additional Comment:     _______________________________________________ Dannica Bickham, Veronia Beets, LCSW 07/09/2018, 1:39 PM

## 2018-07-09 NOTE — Progress Notes (Signed)
Per Otila Kluver [facility] liaison [facility] authorization has been received. Patient can D/C to [facility] when medically stable. Clinical Social Worker (CSW) contacted patient's daughter [name] and made her aware of above.   [employer], LCSW [phone]

## 2018-07-10 LAB — CULTURE, BLOOD (ROUTINE X 2)
Culture: NO GROWTH
Culture: NO GROWTH
Special Requests: ADEQUATE

## 2018-07-11 NOTE — Progress Notes (Signed)
New referral for outpatient Palliative to follow at [facility] received from [place]. Patient information faxed to referral. Flo Shanks RN, BSN, [facility] [place], hospital Liaison [phone]

## 2018-07-22 ENCOUNTER — Ambulatory Visit: Payer: Medicare Other | Admitting: Family

## 2018-07-23 ENCOUNTER — Encounter: Payer: Self-pay | Admitting: Nurse Practitioner

## 2018-07-23 ENCOUNTER — Non-Acute Institutional Stay: Payer: Medicare Other | Admitting: Nurse Practitioner

## 2018-07-23 VITALS — HR 88 | Temp 98.0°F | Resp 18

## 2018-07-23 DIAGNOSIS — R531 Weakness: Secondary | ICD-10-CM

## 2018-07-23 DIAGNOSIS — R0600 Dyspnea, unspecified: Secondary | ICD-10-CM | POA: Insufficient documentation

## 2018-07-23 DIAGNOSIS — Z515 Encounter for palliative care: Secondary | ICD-10-CM

## 2018-07-23 DIAGNOSIS — R0609 Other forms of dyspnea: Secondary | ICD-10-CM

## 2018-07-23 NOTE — Progress Notes (Signed)
[facility] Telephone: [phone] Fax: [phone]  PATIENT [name] DOB: December 03, 1924 MRN: [id number]  PRIMARY CARE PROVIDER:   Tracie Harrier, MD  REFERRING PROVIDER:  Dr [name] Resources RESPONSIBLE PARTY:   daughter [name] at [phone] or [phone]   RECOMMENDATIONS and PLAN:  1. Palliative care encounter Z51.5; Palliative medicine team will continue to support patient, patient's family, and medical team. Visit consisted of counseling and education dealing with the complex and emotionally intense issues of symptom management and palliative care in the setting of serious and potentially life-threatening illness  2. Dyspneic R06.00 secondary to CHF remain stable at present time. Continue daily weights, continue inhalation therapy; continous O2  3. Generalized weakness R53.1  secondary to CHF continue with therapy as able. Encourage energy conservation and rest times.  ASSESSMENT:     I visit and observed [name]. We talked about purpose for palliative care visit. We talked about symptoms of pain which she expressed she does have on and off with chronic back pain and having multiple rods in her back. We talked about alternative methods for pain relief including positioning which she shared seems to help the most. We talked about shortness of breath and she shared that it's improving. The nebulizers have helped considerably and she continues to wear her oxygen. We talked about her cough which [name] endorses is also improving. We talked about her work with therapy and she felt very frustrated that she is progressing very slow. She shared that she is disappointed in herself. We talked about the multiple hospitalizations consecutively and that it takes time to improving hill. We talked about multiple catastrophic events and comorbidities in the setting of chronic disease a natural aging. We talked about her living at home with her husband.  They do have caregivers that assist then. We talked about her wishes to return home. DNR does remain in place. [name] and I talked about updating her daughter on palliative care visit in [place] was in agreement.  I met with [name]'s daughter, [name] separately. Talked about purpose for palliative care visit. Talked about visit with [name]. We talked about past medical history, chronic disease progression in the setting of natural aging with multiple repetitive hospitalizations. We talked about symptoms of shortness of breath, pain. We briefly talked about appetite. We talked about her slow progress with therapy. We talked about her fear of falling when she stands. We talked about her inability to ambulate. We talked about concerns for when short-term rehab is completed if she would be able to return home. [name] endorses [name] is coming to look at her which is an assisted living. If they do accept her they would transition her husband there as well. We also talked about bringing [name] home and resources that would be available if she has that down. We talked about home health and in-home palliative care. We talked about medical goals to include aggressive versus conservative versus Comfort Care. Wishes are for comfort care only and not to return to the hospital if needed to keep her comfortable where she is. Discuss options of hospice screening. Discuss that she could be eligible due to multiple hospitalizations, ongoing symptoms with decline in congestive heart failure requiring oxygen now and bed-bound status from being ambulatory prior to hospitalization. We talked about process for hospice screening and what services they provide. DNR does remain in place. We also talked about option of psychiatric consult during short-term rehab concerning  the fear of falling. [name] in agreement with plan of care. Therapeutic listening and emotional support provided. We talked about role of palliative care  and plan of care. Questions answered to satisfaction. Contact information provided. Updated [name] social worker concerning playing with recommendation for psychiatric consult for anxiety, fear of falling in addition to hospice screening order upon discharge home.  1 / 6 / 2020 WBC 5.5, hemoglobin 10.1, hemocrit 33.3, platelets 152  I spent 90 minutes providing this consultation,  from 10:45amto 12:15pm. More than 50% of the time in this consultation was spent coordinating communication.   HISTORY OF PRESENT ILLNESS:  [name] is a 85 y.o. year old female with multiple medical problems including COPD, diastolic and systolic congestive heart failure, coronary stent placement, atrial fibrillation, pacemaker, pulmonary nodule, hypertension, skin cancer, chronic back pain s/p sgy, poor perfusion legs with leg surgery, foot surgery, abdominal hysterectomy. Hospitalized 11/20 08/2017 for acute metabolic encephalopathy secondary to acute renal failure which resolved with IV fluid hydration. CHF exacerbation. Hospitalized 12 / 9 / 2018 to 55 / 11 / 2019 for weakness with left lower extremity cellulitis. He did receive antibiotic therapy. Chronic diastolic congestive heart failure with ef in 6 / 2019 50 to 55%. She was discharged home with caregivers. Hospitalized 12 / 22 / 2019 to 12 / 28 / 2019 for cellulitis of lower extremity, acute on chronic congestive heart failure, chronic lymphedema of legs, chronic atrial fibrillation on [place] Xarelto. Hospitalize 1 / 6 / 2022 1/8 / 2024 acute respiratory failure with healthcare-associated pneumonia require a antibiotic therapy with recent congestive heart failure. Acute hyperglycemia with chronic diabetes. Chronic pain syndrome. Chronic hypoxic respiratory failure requiring oxygen at night. Atrial fib stable s/p ablation, s/p pacemaker placement. Recommended [facility] for Rehab the wanted to return home. She was re hospitalized 1 / 12 / 2022  1 / 16 / 2,020 for shortness of breath, weakness with work up significant for acute hypoxic respiratory failure secondary to diastolic congestive heart failure exacerbation with influenza illness. She did require Lasix IV and continuous oxygen receiving Tamiflu for influenza completed Augmentin for recent pneumonia. General weakness with recommendation for short-term rehab. Physical Therapy consulted with overall poor prognosis due to multiple medical conditions. 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 She lives with her husband who has memory issues and Falls. They do have caregivers in the home from 9A to 1 and 5 / 6 P to 10 P. She has a daughter, [name] who lives in [place] in the son in [place] who has his own health issues. Her documentation palliative care she did express feeling depressed with ultimate goal to return home to be with her husband. It was discussed with daughter [name] preparation to get [name] home with increase caregivers in hospice if [name] wishes. She is a DNR. She was discharged short-term rehab where she currently resides. She was seen by primary provider at facility 1 / 24 / 2020 with making some progress with PT/OT, very fearful about standing, walking per therapy. She continues to have a cough with nebulizer treatments though no longer on antibiotic therapy. She is on oxygen chronically. Blood sugar's with blood pressures have been fluctuating with adjustments. Continues have a decrease appetite regular bowel movements. At present she is lying in bed. She appears chronically ill but comfortable. Her daughter, [name] is at the facility.  Palliative Care was asked to help address goals of care.   CODE STATUS: DNR  PPS: 30% HOSPICE ELIGIBILITY/DIAGNOSIS: appears clinically <6 months with clinical presentation and wishes are for comfort care per  daughter  PAST MEDICAL HISTORY:  Past Medical History:  Diagnosis Date  . Cancer ([place])    skin ca  . CHF (congestive heart failure) ([place])   . Chronic back pain   . COPD (chronic obstructive pulmonary disease) ([place])   . Diabetes mellitus without complication ([place])   . Hypertension   . Pacemaker    2015  . Poor perfusion of leg     SOCIAL HX:  Social History   Tobacco Use  . Smoking status: Never Smoker  . Smokeless tobacco: Never Used  Substance Use Topics  . Alcohol use: No    Alcohol/week: 0.0 standard drinks    ALLERGIES:  Allergies  Allergen Reactions  . Ceftriaxone Swelling    Tolerates piperacillin/tazobactam, PCN G  . Cefuroxime Hives    Tolerates piperacillin/tazobactam, PCN G  . Cephalosporins Swelling and Other (See Comments)    Reaction:  Fainting/dry mouth  Tolerates piperacillin/tazobactam, PCN G  . Codeine Nausea And Vomiting  . Epinephrine Other (See Comments)    Reaction:  Fainting   . Morphine And Related Other (See Comments)    Reaction:  GI upset   . Buprenorphine Hcl Nausea And Vomiting  . Ciprofloxacin Itching, Swelling and Rash  . Latex Rash  . Lidocaine Rash  . Procaine Rash     PERTINENT MEDICATIONS:  Outpatient Encounter Medications as of 07/23/2018  Medication Sig  . acetaminophen (TYLENOL) 325 MG tablet Take 2 tablets (650 mg total) by mouth every 6 (six) hours as needed for mild pain (or Fever >/= 101).  Marland Kitchen aspirin EC 81 MG tablet Take 81 mg by mouth daily.  . baclofen (LIORESAL) 10 MG tablet Take 10 mg by mouth 3 (three) times daily as needed for muscle spasms.   Marland Kitchen buPROPion (WELLBUTRIN XL) 150 MG 24 hr tablet Take 150 mg by mouth daily.   . carvedilol (COREG) 6.25 MG tablet Take 6.25 mg by mouth 2 (two) times daily with a meal.   . cholecalciferol (VITAMIN D) 1000 units tablet Take 1,000 Units by mouth daily.  . clopidogrel (PLAVIX) 75 MG tablet Take 75 mg by mouth daily.  . diclofenac sodium (VOLTAREN) 1 % GEL Apply 2 g topically 4  (four) times daily.  Marland Kitchen docusate sodium (COLACE) 100 MG capsule Take 100 mg by mouth 2 (two) times daily.   Marland Kitchen escitalopram (LEXAPRO) 10 MG tablet Take 10 mg by mouth daily.  . ferrous sulfate 325 (65 FE) MG tablet Take 325 mg by mouth daily with breakfast.  . furosemide (LASIX) 40 MG tablet Take 1 tablet (40 mg total) by mouth 2 (two) times daily.  Marland Kitchen gabapentin (NEURONTIN) 100 MG capsule Take 100-200 mg by mouth at bedtime.  Marland Kitchen glimepiride (AMARYL) 4 MG tablet Take 4 mg by mouth 2 (two) times daily.  Marland Kitchen ipratropium-albuterol (DUONEB) 0.5-2.5 (3) MG/3ML SOLN Take 3 mLs by nebulization every 6 (six) hours as needed (wheezing).  . lactobacillus (FLORANEX/LACTINEX) PACK Take 1 g by mouth daily.  . magnesium oxide (MAG-OX) 400 MG tablet Take 400 mg by mouth daily.  . Melatonin 1 MG TABS Take 1 mg by mouth at bedtime.  . metFORMIN (GLUCOPHAGE) 500 MG tablet Take 1 tablet by mouth daily.  . Multiple Vitamin (MULTIVITAMIN) tablet Take 1 tablet by mouth daily.  Marland Kitchen oxycodone (  OXY-IR) 5 MG capsule Take 1-2 capsules (5-10 mg total) by mouth every 6 (six) hours as needed (MOD SEVERE pain).  . pantoprazole (PROTONIX) 40 MG tablet Take 40 mg by mouth daily.  Marland Kitchen PROAIR HFA 108 (90 Base) MCG/ACT inhaler Inhale 2 puffs into the lungs every 6 (six) hours as needed for wheezing or shortness of breath.   . Rivaroxaban (XARELTO) 15 MG TABS tablet Take 15 mg by mouth daily.  Marland Kitchen rOPINIRole (REQUIP) 0.5 MG tablet Take 0.5 mg by mouth 2 (two) times daily.  Marland Kitchen senna (SENOKOT) 8.6 MG TABS tablet Take 1 tablet (8.6 mg total) by mouth daily.  . sitaGLIPtin (JANUVIA) 100 MG tablet Take 100 mg by mouth daily.  Marland Kitchen spironolactone (ALDACTONE) 25 MG tablet Take 0.5 tablets (12.5 mg total) by mouth daily.  . vitamin B-12 (CYANOCOBALAMIN) 1000 MCG tablet Take 1,000 mcg by mouth daily.  . vitamin C (ASCORBIC ACID) 500 MG tablet Take 500 mg by mouth daily.  . Vitamin D, Ergocalciferol, (DRISDOL) 50000 UNITS CAPS capsule Take 50,000 Units by  mouth every 7 (seven) days. Pt takes on Thursday.   No facility-administered encounter medications on file as of 07/23/2018.     PHYSICAL EXAM:   General: frail appearing, chronically ill, pleasant female Cardiovascular: regular rate and rhythm Pulmonary: few crackles bases Abdomen: soft, nontender, + bowel sounds GU: no suprapubic tenderness Extremities: + edema, no joint deformities Skin: no rashes Neurological: Weakness but otherwise nonfocal/non-ambulatory  [name] Sarah Gully, NP

## 2018-08-26 ENCOUNTER — Emergency Department
Admission: EM | Admit: 2018-08-26 | Discharge: 2018-08-26 | Disposition: A | Discharge status: Skilled Nursing Facility | Payer: Medicare Other | Attending: Emergency Medicine | Admitting: Emergency Medicine

## 2018-08-26 DIAGNOSIS — E119 Type 2 diabetes mellitus without complications: Secondary | ICD-10-CM | POA: Diagnosis not present

## 2018-08-26 DIAGNOSIS — S80922A Unspecified superficial injury of left lower leg, initial encounter: Secondary | ICD-10-CM | POA: Diagnosis present

## 2018-08-26 DIAGNOSIS — Z7982 Long term (current) use of aspirin: Secondary | ICD-10-CM | POA: Insufficient documentation

## 2018-08-26 DIAGNOSIS — Y999 Unspecified external cause status: Secondary | ICD-10-CM | POA: Diagnosis not present

## 2018-08-26 DIAGNOSIS — Z79899 Other long term (current) drug therapy: Secondary | ICD-10-CM | POA: Diagnosis not present

## 2018-08-26 DIAGNOSIS — Y939 Activity, unspecified: Secondary | ICD-10-CM | POA: Insufficient documentation

## 2018-08-26 DIAGNOSIS — I5043 Acute on chronic combined systolic (congestive) and diastolic (congestive) heart failure: Secondary | ICD-10-CM | POA: Diagnosis not present

## 2018-08-26 DIAGNOSIS — S81812A Laceration without foreign body, left lower leg, initial encounter: Secondary | ICD-10-CM | POA: Insufficient documentation

## 2018-08-26 DIAGNOSIS — Z91040 Latex allergy status: Secondary | ICD-10-CM | POA: Insufficient documentation

## 2018-08-26 DIAGNOSIS — Z23 Encounter for immunization: Secondary | ICD-10-CM | POA: Diagnosis not present

## 2018-08-26 DIAGNOSIS — Y929 Unspecified place or not applicable: Secondary | ICD-10-CM | POA: Insufficient documentation

## 2018-08-26 DIAGNOSIS — Z794 Long term (current) use of insulin: Secondary | ICD-10-CM | POA: Diagnosis not present

## 2018-08-26 DIAGNOSIS — J449 Chronic obstructive pulmonary disease, unspecified: Secondary | ICD-10-CM | POA: Diagnosis not present

## 2018-08-26 DIAGNOSIS — W228XXA Striking against or struck by other objects, initial encounter: Secondary | ICD-10-CM | POA: Diagnosis not present

## 2018-08-26 DIAGNOSIS — I110 Hypertensive heart disease with heart failure: Secondary | ICD-10-CM | POA: Insufficient documentation

## 2018-08-26 MED ORDER — LIDOCAINE-EPINEPHRINE (PF) 2 %-1:200000 IJ SOLN
20.0000 mL | Freq: Once | INTRAMUSCULAR | Status: AC
  Administered 2018-08-26: 20 mL
  Filled 2018-08-26: qty 20

## 2018-08-26 MED ORDER — TETANUS-DIPHTH-ACELL PERTUSSIS 5-2.5-18.5 LF-MCG/0.5 IM SUSP
0.5000 mL | Freq: Once | INTRAMUSCULAR | Status: AC
  Administered 2018-08-26: 0.5 mL via INTRAMUSCULAR
  Filled 2018-08-26: qty 0.5

## 2018-08-26 MED ORDER — AMOXICILLIN 875 MG PO TABS: 875.0000 mg | ORAL_TABLET | Freq: Two times a day (BID) | ORAL | 0 refills | Status: DC

## 2018-08-26 NOTE — ED Triage Notes (Signed)
Pt arrives [facility] from [facility]. Was getting out of bed and leg got caught. Possible lac to leg or skin tear. Bled through gauze per EMS. Hx DM, CBG 109. VSS with EMS.

## 2018-08-26 NOTE — ED Notes (Addendum)
MD at bedside to sew pt L leg.

## 2018-08-26 NOTE — Discharge Instructions (Signed)
Take amoxicillin to protect against infection of your left leg wound.   We were unable to completely close the laceration of your leg because your skin is not strong enough to stretch that far.  Starting tomorrow, change the dressing two times a day with clean dry gauze.  Follow up with your doctor in 2 days to have it re-evaluated to ensure it is healing appropriately.    The wound was closed with 2 sets of running sutures, as well as 2 stand-alone sutures at the upper (proximal) edge. These will all need to be removed by your doctor in 1-2 weeks.

## 2018-08-26 NOTE — ED Provider Notes (Signed)
[facility] Emergency Department Provider Note  ____________________________________________  Time seen: Approximately 10:25 AM  I have reviewed the triage vital signs and the nursing notes.   HISTORY  Chief Complaint Leg Pain    HPI [name] is a 85 y.o. female with a history of CHF COPD diabetes hypertension and vascular disease who comes the ED complaining of left leg pain after a laceration of the left leg from her wheelchair.  She was in her usual state of health, and on getting out of her wheelchair at [facility], the edge of the leg got caught by the metal on the wheelchair itself.  She does not remember when her last tetanus shot was.  She denies any other complaints.  She did not fall or hit her head.  Pain is sudden onset, constant, no aggravating or alleviating factors.  She is on Xarelto.      Past Medical History:  Diagnosis Date  . Cancer ([place])    skin ca  . CHF (congestive heart failure) ([place])   . Chronic back pain   . COPD (chronic obstructive pulmonary disease) ([place])   . Diabetes mellitus without complication ([place])   . Hypertension   . Pacemaker    2015  . Poor perfusion of leg      Patient Active Problem List   Diagnosis Date Noted  . Dyspnea 07/23/2018  . Weakness generalized 07/23/2018  . Goals of care, counseling/discussion   . Palliative care encounter   . Influenza A 07/05/2018  . CHF exacerbation ([place]) 06/14/2018  . Cellulitis of left lower extremity 06/02/2018  . ARF (acute renal failure) ([place]) 05/17/2018  . Pneumonia 12/11/2017  . CAP (community acquired pneumonia) 04/14/2017  . COPD (chronic obstructive pulmonary disease) ([place]) 12/07/2016  . UTI (urinary tract infection) 12/07/2016  . Ventral hernia without obstruction or gangrene   . Hyponatremia 09/07/2015  . Acute respiratory failure ([place]) 09/01/2015  . Atrial fibrillation ([place]) 04/10/2015  . Essential hypertension 04/10/2015  . Diabetes ([place])  04/10/2015  . Acute on chronic combined systolic and diastolic CHF (congestive heart failure) ([place]) 03/21/2015  . Pulmonary nodule 11/10/2014     Past Surgical History:  Procedure Laterality Date  . ABDOMINAL HYSTERECTOMY    . BACK SURGERY     x3  . CORONARY STENT PLACEMENT     unknown location per pt  . FOOT SURGERY    . LEG SURGERY    . PACEMAKER PLACEMENT    . TONSILLECTOMY       Prior to Admission medications   Medication Sig Start Date End Date Taking? Authorizing Provider  acetaminophen (TYLENOL) 325 MG tablet Take 2 tablets (650 mg total) by mouth every 6 (six) hours as needed for mild pain (or Fever >/= 101). 04/18/17  Yes Gouru, Illene Silver, MD  aspirin EC 81 MG tablet Take 81 mg by mouth daily.   Yes [provider]  baclofen (LIORESAL) 10 MG tablet Take 10 mg by mouth 3 (three) times daily as needed (pain).    Yes [provider]  buPROPion (WELLBUTRIN XL) 300 MG 24 hr tablet Take 300 mg by mouth daily.  11/28/17  Yes [provider]  busPIRone (BUSPAR) 5 MG tablet Take 5 mg by mouth 2 (two) times daily.   Yes [provider]  carvedilol (COREG) 6.25 MG tablet Take 6.25 mg by mouth 2 (two) times daily with a meal.    Yes [provider]  cholecalciferol (VITAMIN D) 1000 units tablet  Take 2,000 Units by mouth daily.    Yes [provider]  docusate sodium (COLACE) 100 MG capsule Take 100 mg by mouth 2 (two) times daily.    Yes [provider]  escitalopram (LEXAPRO) 10 MG tablet Take 10 mg by mouth daily.   Yes [provider]  ferrous sulfate 325 (65 FE) MG tablet Take 325 mg by mouth daily with breakfast.   Yes [provider]  fexofenadine (ALLEGRA) 60 MG tablet Take 60 mg by mouth 2 (two) times daily.   Yes [provider]  furosemide (LASIX) 40 MG tablet Take 1 tablet (40 mg total) by mouth 2 (two) times daily. 07/09/18  Yes Gladstone Lighter, MD  gabapentin (NEURONTIN) 100 MG capsule  Take 100-200 mg by mouth See admin instructions. Take 1 capsule (100mg ) by mouth every night at bedtime and 1 additional capsule by mouth at bedtime as needed for leg pain   Yes [provider]  insulin glargine (LANTUS) 100 UNIT/ML injection Inject 30 Units into the skin at bedtime.   Yes [provider]  ipratropium-albuterol (DUONEB) 0.5-2.5 (3) MG/3ML SOLN Take 3 mLs by nebulization every 6 (six) hours as needed (wheezing). Patient taking differently: Take 3 mLs by nebulization 4 (four) times daily as needed (shortness of breath).  07/09/18  Yes Gladstone Lighter, MD  magnesium oxide (MAG-OX) 400 MG tablet Take 400 mg by mouth daily.   Yes [provider]  Melatonin 3 MG TABS Take 3 mg by mouth at bedtime.    Yes [provider]  metFORMIN (GLUCOPHAGE-XR) 750 MG 24 hr tablet Take 750 mg by mouth daily.   Yes [provider]  metoCLOPramide (REGLAN) 5 MG tablet Take 5 mg by mouth every 6 (six) hours as needed for nausea or vomiting.   Yes [provider]  Multiple Vitamin (MULTIVITAMIN) tablet Take 1 tablet by mouth daily.   Yes [provider]  oxyCODONE (OXY IR/ROXICODONE) 5 MG immediate release tablet Take 5-10 mg by mouth every 6 (six) hours as needed for moderate pain or severe pain.   Yes [provider]  pantoprazole (PROTONIX) 40 MG tablet Take 40 mg by mouth daily.   Yes [provider]  polyethylene glycol (MIRALAX / GLYCOLAX) packet Take 17 g by mouth daily.   Yes [provider]  PROAIR HFA 108 (90 Base) MCG/ACT inhaler Inhale 2 puffs into the lungs every 6 (six) hours as needed for wheezing or shortness of breath.  10/02/17  Yes [provider]  Rivaroxaban (XARELTO) 15 MG TABS tablet Take 15 mg by mouth daily.   Yes [provider]  rOPINIRole (REQUIP) 0.5 MG tablet Take 0.5 mg by mouth at bedtime.    Yes [provider]  senna (SENOKOT) 8.6 MG TABS tablet Take 1 tablet  (8.6 mg total) by mouth daily. 07/10/18  Yes Gladstone Lighter, MD  sodium phosphate (FLEET) 7-19 GM/118ML ENEM Place 1 enema rectally daily as needed for severe constipation.   Yes [provider]  spironolactone (ALDACTONE) 25 MG tablet Take 0.5 tablets (12.5 mg total) by mouth daily. 05/18/18  Yes Salary, Avel Peace, MD  traZODone (DESYREL) 50 MG tablet Take 25 mg by mouth at bedtime.   Yes [provider]  vitamin B-12 (CYANOCOBALAMIN) 1000 MCG tablet Take 1,000 mcg by mouth daily.   Yes [provider]  vitamin C (ASCORBIC ACID) 500 MG tablet Take 500 mg by mouth daily.   Yes [provider]  Vitamin  D, Ergocalciferol, (DRISDOL) 50000 UNITS CAPS capsule Take 50,000 Units by mouth every Thursday.    Yes [provider]  amoxicillin (AMOXIL) 875 MG tablet Take 1 tablet (875 mg total) by mouth 2 (two) times daily. 08/26/18   Carrie Mew, MD  oxycodone (OXY-IR) 5 MG capsule Take 1-2 capsules (5-10 mg total) by mouth every 6 (six) hours as needed (MOD SEVERE pain). 07/09/18   Gladstone Lighter, MD     Allergies Ceftriaxone; Cefuroxime; Cephalosporins; Codeine; Epinephrine; Morphine and related; Buprenorphine hcl; Ciprofloxacin; Latex; Lidocaine; and Procaine   Family History  Problem Relation Age of Onset  . CVA Mother        Congestive Heart Failure, heart attack, hypertension, stroke  . Diabetes Mellitus II Sister   . Heart disease Father        Congestive Heart Failure, heart attack, hypertension    Social History Social History   Tobacco Use  . Smoking status: Never Smoker  . Smokeless tobacco: Never Used  Substance Use Topics  . Alcohol use: No    Alcohol/week: 0.0 standard drinks  . Drug use: No    Review of Systems  Constitutional:   No fever or chills.   Cardiovascular:   No chest pain or syncope. Respiratory:   No dyspnea or cough.  Musculoskeletal:   Left leg pain as above All other systems reviewed and are  negative except as documented above in ROS and HPI.  ____________________________________________   PHYSICAL EXAM:  VITAL SIGNS: ED Triage Vitals  Enc Vitals Group     BP 08/26/18 0729 (!) 128/50     Pulse Rate 08/26/18 0729 75     Resp 08/26/18 0729 18     Temp 08/26/18 0729 97.8 F (36.6 C)     Temp Source 08/26/18 0729 Oral     SpO2 08/26/18 0729 99 %     Weight 08/26/18 0729 171 lb (77.6 kg)     Height 08/26/18 0729 5\' 7"  (1.702 m)     Head Circumference --      Peak Flow --      Pain Score 08/26/18 0728 7     Pain Loc --      Pain Edu? --      Excl. in [place]? --     Vital signs reviewed, nursing assessments reviewed.   Constitutional:   Alert and oriented. Non-toxic appearance. Eyes:   Conjunctivae are normal. EOMI.  ENT      Head:   Normocephalic and atraumatic.      Nose:   No congestion/rhinnorhea.       Mouth/Throat:   MMM, no pharyngeal erythema. No peritonsillar mass.       Neck:   No meningismus. Full ROM.  No midline tenderness Hematological/Lymphatic/Immunilogical:   No cervical lymphadenopathy. Cardiovascular:   RRR. Symmetric bilateral radial and DP pulses.  No murmurs. Cap refill less than 2 seconds. Respiratory:   Normal respiratory effort without tachypnea/retractions. Breath sounds are clear and equal bilaterally. No wheezes/rales/rhonchi. Gastrointestinal:   Soft and nontender. Non distended. There is no CVA tenderness.  No rebound, rigidity, or guarding.  Musculoskeletal:   Normal range of motion in all extremities. No joint effusions.    Tenderness over 8 cm stellate and linear laceration over the left lateral lower leg, gaping 1 to 2 cm wide.  There is a long strip of avulsed skin from the center of the wound that is attached to a large nevus that has been partially excised by the trauma  itself from the inferior edge of the wound.  See image below  Trace bilateral edema. Neurologic:   Normal speech and language.  Motor grossly intact. No acute  focal neurologic deficits are appreciated.  Skin:    Skin is warm, dry and intact. No rash noted.  No petechiae, purpura, or bullae.      ____________________________________________    LABS (pertinent positives/negatives) (all labs ordered are listed, but only abnormal results are displayed) Labs Reviewed - No data to display ____________________________________________   EKG    ____________________________________________    RADIOLOGY  No results found.  ____________________________________________   PROCEDURES .Marland KitchenLaceration Repair Date/Time: 08/26/2018 10:33 AM Performed by: Carrie Mew, MD Authorized by: Carrie Mew, MD   Consent:    Consent obtained:  Verbal   Consent given by:  Patient   Risks discussed:  Infection, pain, retained foreign body, poor cosmetic result and poor wound healing   Alternatives discussed:  Referral Anesthesia (see MAR for exact dosages):    Anesthesia method:  Local infiltration   Local anesthetic:  Lidocaine 2% WITH epi Laceration details:    Location:  Leg   Leg location:  L lower leg   Length (cm):  8 Repair type:    Repair type:  Complex Pre-procedure details:    Preparation:  Patient was prepped and draped in usual sterile fashion Exploration:    Limited defect created (wound extended): yes     Hemostasis achieved with:  Direct pressure   Wound exploration: entire depth of wound probed and visualized     Wound extent: no foreign bodies/material noted, no muscle damage noted, no nerve damage noted, no tendon damage noted, no underlying fracture noted and no vascular damage noted     Contaminated: no   Treatment:    Area cleansed with:  Saline and Betadine   Amount of cleaning:  Extensive   Irrigation solution:  Sterile saline   Irrigation volume:  500   Irrigation method:  Pressure wash   Visualized foreign bodies/material removed: no     Debridement:  Moderate   Undermining:  Minimal   Scar revision: no     Subcutaneous repair:    Suture size:  4-0   Wound subcutaneous closure material used: Monocryl.   Suture technique:  Simple interrupted   Number of sutures:  3 Skin repair:    Repair method:  Sutures   Suture size:  4-0   Wound skin closure material used: Ethilon.   Suture technique:  Running, simple interrupted and horizontal mattress   Number of sutures:  12 Approximation:    Approximation:  Loose Post-procedure details:    Dressing:  Sterile dressing   Patient tolerance of procedure:  Tolerated well, no immediate complications    ____________________________________________    CLINICAL IMPRESSION / ASSESSMENT AND PLAN / ED COURSE  Medications ordered in the ED: Medications  Tdap (BOOSTRIX) injection 0.5 mL (0.5 mLs Intramuscular Given 08/26/18 0751)  lidocaine-EPINEPHrine (XYLOCAINE W/EPI) 2 %-1:200000 (PF) injection 20 mL (20 mLs Infiltration Given 08/26/18 0819)    Pertinent labs & imaging results that were available during my care of the patient were reviewed by me and considered in my medical decision making (see chart for details).    Patient presents with left leg laceration.  No evidence of dislocation or fracture.  No other symptoms, she is otherwise in her chronic baseline state of health.  The devitalized strip of skin and large nevus were excised to allow for wound closure.  Due to  the frailty of the patient's skin and the gaping nature of the remaining wound, some portions were unable to be completely closed but were reapproximated to about 5 mm of wound with.  Xeroform applied over the wound.  Dry gauze and Kerlix applied.  I will start amoxicillin prophylaxis given her vascular disease and other comorbidities including diabetes and age putting her at elevated risk of superinfection of the wound.  Recommend close follow-up with primary care.  Of note her chart says that she has allergies to epinephrine and lidocaine.  Her reported reaction was passing out, but this  was also during a dental procedure.  Needing local anesthesia for this wound care, and doubting a true allergy, I gave lidocaine with epinephrine without adverse effect.  No shortness of breath or rash or other new symptoms.      ____________________________________________   FINAL CLINICAL IMPRESSION(S) / ED DIAGNOSES    Final diagnoses:  Laceration of left lower extremity, initial encounter     ED Discharge Orders         Ordered    amoxicillin (AMOXIL) 875 MG tablet  2 times daily     08/26/18 1011          Portions of this note were generated with dragon dictation software. Dictation errors may occur despite best attempts at proofreading.   Carrie Mew, MD 08/26/18 [phone]

## 2018-08-26 NOTE — ED Notes (Signed)
Daughter has left. EMS at bedside.

## 2018-08-26 NOTE — ED Notes (Signed)
Holley Raring, ED secretary called EMS for pickup back to [facility].

## 2018-08-26 NOTE — ED Notes (Signed)
Attempted to call [facility]. Call was forwarded and no one picked up. Left a HIPPA complient message and asked for facility to call this RN back to figure out a way for pt to return to [facility].

## 2018-08-26 NOTE — ED Notes (Signed)
EMS had reported that facility gave pt 17U of insulin but pt had not eaten. MD informed. Stated ok to [place]. Pt only requested milk at this time.

## 2018-08-26 NOTE — ED Notes (Signed)
Daughter at bedside. States unable to take pt in her car but states if [facility] brings pt wheelchair then [facility] can transport pt back to facility. Will call [facility]. Also informed daughter to call [facility].   Pt given second milk to drink.

## 2018-08-26 NOTE — ED Notes (Signed)
Daughter called [facility] who stated that they are unable to come get pt and to call for EMS transport back to [facility].

## 2018-09-02 ENCOUNTER — Other Ambulatory Visit: Payer: Self-pay

## 2018-09-02 ENCOUNTER — Emergency Department: Payer: Medicare Other

## 2018-09-02 ENCOUNTER — Inpatient Hospital Stay
Admission: EM | Admit: 2018-09-02 | Discharge: 2018-09-07 | DRG: 603 | Disposition: A | Discharge status: Home Health | Payer: Medicare Other | Attending: Internal Medicine | Admitting: Internal Medicine

## 2018-09-02 DIAGNOSIS — Z7901 Long term (current) use of anticoagulants: Secondary | ICD-10-CM

## 2018-09-02 DIAGNOSIS — I130 Hypertensive heart and chronic kidney disease with heart failure and stage 1 through stage 4 chronic kidney disease, or unspecified chronic kidney disease: Secondary | ICD-10-CM | POA: Diagnosis present

## 2018-09-02 DIAGNOSIS — I5042 Chronic combined systolic (congestive) and diastolic (congestive) heart failure: Secondary | ICD-10-CM | POA: Diagnosis present

## 2018-09-02 DIAGNOSIS — L02416 Cutaneous abscess of left lower limb: Secondary | ICD-10-CM | POA: Diagnosis not present

## 2018-09-02 DIAGNOSIS — Z794 Long term (current) use of insulin: Secondary | ICD-10-CM

## 2018-09-02 DIAGNOSIS — Z823 Family history of stroke: Secondary | ICD-10-CM

## 2018-09-02 DIAGNOSIS — Z833 Family history of diabetes mellitus: Secondary | ICD-10-CM

## 2018-09-02 DIAGNOSIS — S82142D Displaced bicondylar fracture of left tibia, subsequent encounter for closed fracture with routine healing: Secondary | ICD-10-CM

## 2018-09-02 DIAGNOSIS — L03116 Cellulitis of left lower limb: Secondary | ICD-10-CM

## 2018-09-02 DIAGNOSIS — N183 Chronic kidney disease, stage 3 (moderate): Secondary | ICD-10-CM | POA: Diagnosis present

## 2018-09-02 DIAGNOSIS — M549 Dorsalgia, unspecified: Secondary | ICD-10-CM | POA: Diagnosis present

## 2018-09-02 DIAGNOSIS — Z85828 Personal history of other malignant neoplasm of skin: Secondary | ICD-10-CM

## 2018-09-02 DIAGNOSIS — Z79899 Other long term (current) drug therapy: Secondary | ICD-10-CM

## 2018-09-02 DIAGNOSIS — S82122A Displaced fracture of lateral condyle of left tibia, initial encounter for closed fracture: Secondary | ICD-10-CM

## 2018-09-02 DIAGNOSIS — Z950 Presence of cardiac pacemaker: Secondary | ICD-10-CM

## 2018-09-02 DIAGNOSIS — M25512 Pain in left shoulder: Secondary | ICD-10-CM | POA: Diagnosis not present

## 2018-09-02 DIAGNOSIS — S42025A Nondisplaced fracture of shaft of left clavicle, initial encounter for closed fracture: Secondary | ICD-10-CM

## 2018-09-02 DIAGNOSIS — S43402D Unspecified sprain of left shoulder joint, subsequent encounter: Secondary | ICD-10-CM

## 2018-09-02 DIAGNOSIS — W050XXA Fall from non-moving wheelchair, initial encounter: Secondary | ICD-10-CM | POA: Diagnosis present

## 2018-09-02 DIAGNOSIS — Z7982 Long term (current) use of aspirin: Secondary | ICD-10-CM

## 2018-09-02 DIAGNOSIS — J449 Chronic obstructive pulmonary disease, unspecified: Secondary | ICD-10-CM | POA: Diagnosis present

## 2018-09-02 DIAGNOSIS — G8929 Other chronic pain: Secondary | ICD-10-CM | POA: Diagnosis present

## 2018-09-02 DIAGNOSIS — S82142A Displaced bicondylar fracture of left tibia, initial encounter for closed fracture: Secondary | ICD-10-CM | POA: Diagnosis present

## 2018-09-02 DIAGNOSIS — Z955 Presence of coronary angioplasty implant and graft: Secondary | ICD-10-CM

## 2018-09-02 DIAGNOSIS — S8392XD Sprain of unspecified site of left knee, subsequent encounter: Secondary | ICD-10-CM

## 2018-09-02 DIAGNOSIS — S42025D Nondisplaced fracture of shaft of left clavicle, subsequent encounter for fracture with routine healing: Secondary | ICD-10-CM

## 2018-09-02 DIAGNOSIS — Z8249 Family history of ischemic heart disease and other diseases of the circulatory system: Secondary | ICD-10-CM

## 2018-09-02 DIAGNOSIS — Z66 Do not resuscitate: Secondary | ICD-10-CM | POA: Diagnosis present

## 2018-09-02 DIAGNOSIS — I4891 Unspecified atrial fibrillation: Secondary | ICD-10-CM | POA: Diagnosis present

## 2018-09-02 NOTE — ED Provider Notes (Signed)
[facility] Emergency Department Provider Note    First MD Initiated Contact with Patient 09/02/18 2329     (approximate)  I have reviewed the triage vital signs and the nursing notes.   HISTORY  Chief Complaint Shoulder Pain and Leg Swelling    HPI [name] is a 85 y.o. female with below list of previous medical conditions presents to the emergency department following accidental fall on 08/26/2018 with resultant left leg laceration return to the emergency department via EMS worsening left leg and left shoulder pain.  Patient states that she took antibiotics as prescribed however worsening pain at the wound site.  In addition the patient admits to 10 out of 10 left shoulder pain.        Past Medical History:  Diagnosis Date   Cancer ([place])    skin ca   CHF (congestive heart failure) (HCC)    Chronic back pain    COPD (chronic obstructive pulmonary disease) (HCC)    Diabetes mellitus without complication ([place])    Hypertension    Pacemaker    2015   Poor perfusion of leg     Patient Active Problem List   Diagnosis Date Noted   Dyspnea 07/23/2018   Weakness generalized 07/23/2018   Goals of care, counseling/discussion    Palliative care encounter    Influenza A 07/05/2018   CHF exacerbation ([place]) 06/14/2018   Cellulitis of left lower extremity 06/02/2018   ARF (acute renal failure) ([place]) 05/17/2018   Pneumonia 12/11/2017   CAP (community acquired pneumonia) 04/14/2017   COPD (chronic obstructive pulmonary disease) ([place]) 12/07/2016   UTI (urinary tract infection) 12/07/2016   Ventral hernia without obstruction or gangrene    Hyponatremia 09/07/2015   Acute respiratory failure ([place]) 09/01/2015   Atrial fibrillation ([place]) 04/10/2015   Essential hypertension 04/10/2015   Diabetes ([place]) 04/10/2015   Acute on chronic combined systolic and diastolic CHF (congestive heart failure) ([place]) 03/21/2015   Pulmonary  nodule 11/10/2014    Past Surgical History:  Procedure Laterality Date   ABDOMINAL HYSTERECTOMY     BACK SURGERY     x3   CORONARY STENT PLACEMENT     unknown location per pt   FOOT SURGERY     LEG SURGERY     PACEMAKER PLACEMENT     TONSILLECTOMY      Prior to Admission medications   Medication Sig Start Date End Date Taking? Authorizing Provider  acetaminophen (TYLENOL) 325 MG tablet Take 2 tablets (650 mg total) by mouth every 6 (six) hours as needed for mild pain (or Fever >/= 101). 04/18/17   Nicholes Mango, MD  amoxicillin (AMOXIL) 875 MG tablet Take 1 tablet (875 mg total) by mouth 2 (two) times daily. 08/26/18   Carrie Mew, MD  aspirin EC 81 MG tablet Take 81 mg by mouth daily.    [provider]  baclofen (LIORESAL) 10 MG tablet Take 10 mg by mouth 3 (three) times daily as needed (pain).     [provider]  buPROPion (WELLBUTRIN XL) 300 MG 24 hr tablet Take 300 mg by mouth daily.  11/28/17   [provider]  busPIRone (BUSPAR) 5 MG tablet Take 5 mg by mouth 2 (two) times daily.    [provider]  carvedilol (COREG) 6.25 MG tablet Take 6.25 mg by mouth 2 (two) times daily with a meal.     [provider]  cholecalciferol (VITAMIN D) 1000 units tablet Take 2,000 Units by  mouth daily.     [provider]  docusate sodium (COLACE) 100 MG capsule Take 100 mg by mouth 2 (two) times daily.     [provider]  escitalopram (LEXAPRO) 10 MG tablet Take 10 mg by mouth daily.    [provider]  ferrous sulfate 325 (65 FE) MG tablet Take 325 mg by mouth daily with breakfast.    [provider]  fexofenadine (ALLEGRA) 60 MG tablet Take 60 mg by mouth 2 (two) times daily.    [provider]  furosemide (LASIX) 40 MG tablet Take 1 tablet (40 mg total) by mouth 2 (two) times daily. 07/09/18   Gladstone Lighter, MD  gabapentin (NEURONTIN) 100 MG capsule Take 100-200 mg by mouth See admin  instructions. Take 1 capsule (100mg ) by mouth every night at bedtime and 1 additional capsule by mouth at bedtime as needed for leg pain    [provider]  insulin glargine (LANTUS) 100 UNIT/ML injection Inject 30 Units into the skin at bedtime.    [provider]  ipratropium-albuterol (DUONEB) 0.5-2.5 (3) MG/3ML SOLN Take 3 mLs by nebulization every 6 (six) hours as needed (wheezing). Patient taking differently: Take 3 mLs by nebulization 4 (four) times daily as needed (shortness of breath).  07/09/18   Gladstone Lighter, MD  magnesium oxide (MAG-OX) 400 MG tablet Take 400 mg by mouth daily.    [provider]  Melatonin 3 MG TABS Take 3 mg by mouth at bedtime.     [provider]  metFORMIN (GLUCOPHAGE-XR) 750 MG 24 hr tablet Take 750 mg by mouth daily.    [provider]  metoCLOPramide (REGLAN) 5 MG tablet Take 5 mg by mouth every 6 (six) hours as needed for nausea or vomiting.    [provider]  Multiple Vitamin (MULTIVITAMIN) tablet Take 1 tablet by mouth daily.    [provider]  oxyCODONE (OXY IR/ROXICODONE) 5 MG immediate release tablet Take 5-10 mg by mouth every 6 (six) hours as needed for moderate pain or severe pain.    [provider]  oxycodone (OXY-IR) 5 MG capsule Take 1-2 capsules (5-10 mg total) by mouth every 6 (six) hours as needed (MOD SEVERE pain). 07/09/18   Gladstone Lighter, MD  pantoprazole (PROTONIX) 40 MG tablet Take 40 mg by mouth daily.    [provider]  polyethylene glycol (MIRALAX / GLYCOLAX) packet Take 17 g by mouth daily.    [provider]  PROAIR HFA 108 [id number] Base) MCG/ACT inhaler Inhale 2 puffs into the lungs every 6 (six) hours as needed for wheezing or shortness of breath.  10/02/17   [provider]  Rivaroxaban (XARELTO) 15 MG TABS tablet Take 15 mg by mouth daily.    [provider]  rOPINIRole (REQUIP) 0.5 MG tablet Take 0.5 mg by mouth at  bedtime.     [provider]  senna (SENOKOT) 8.6 MG TABS tablet Take 1 tablet (8.6 mg total) by mouth daily. 07/10/18   Gladstone Lighter, MD  sodium phosphate (FLEET) 7-19 GM/118ML ENEM Place 1 enema rectally daily as needed for severe constipation.    [provider]  spironolactone (ALDACTONE) 25 MG tablet Take 0.5 tablets (12.5 mg total) by mouth daily. 05/18/18   Salary, Holly Bodily D, MD  traZODone (DESYREL) 50 MG tablet Take 25 mg by mouth at bedtime.    [provider]  vitamin B-12 (CYANOCOBALAMIN) 1000 MCG tablet Take 1,000 mcg by mouth daily.  [provider]  vitamin C (ASCORBIC ACID) 500 MG tablet Take 500 mg by mouth daily.    [provider]  Vitamin D, Ergocalciferol, (DRISDOL) 50000 UNITS CAPS capsule Take 50,000 Units by mouth every Thursday.     [provider]    Allergies Ceftriaxone; Cefuroxime; Cephalosporins; Codeine; Epinephrine; Morphine and related; Buprenorphine hcl; Ciprofloxacin; Latex; Lidocaine; and Procaine  Family History  Problem Relation Age of Onset   CVA Mother        Congestive Heart Failure, heart attack, hypertension, stroke   Diabetes Mellitus II Sister    Heart disease Father        Congestive Heart Failure, heart attack, hypertension    Social History Social History   Tobacco Use   Smoking status: Never Smoker   Smokeless tobacco: Never Used  Substance Use Topics   Alcohol use: No    Alcohol/week: 0.0 standard drinks   Drug use: No    Review of Systems Constitutional: No fever/chills Eyes: No visual changes. ENT: No sore throat. Cardiovascular: Denies chest pain. Respiratory: Denies shortness of breath. Gastrointestinal: No abdominal pain.  No nausea, no vomiting.  No diarrhea.  No constipation. Genitourinary: Negative for dysuria. Musculoskeletal: Negative for neck pain.  Negative for back pain. Integumentary: Negative for rash. Neurological: Negative for headaches,  focal weakness or numbness.  ____________________________________________   PHYSICAL EXAM:  VITAL SIGNS: ED Triage Vitals  Enc Vitals Group     BP      Pulse      Resp      Temp      Temp src      SpO2      Weight      Height      Head Circumference      Peak Flow      Pain Score      Pain Loc      Pain Edu?      Excl. in [place]?     Constitutional: Alert and oriented. Well appearing and in no acute distress. Eyes: Conjunctivae are normal. PERRL. EOMI. Mouth/Throat: Mucous membranes are moist. Oropharynx non-erythematous. Neck: No stridor.   Cardiovascular: Normal rate, regular rhythm. Good peripheral circulation. Grossly normal heart sounds. Respiratory: Normal respiratory effort.  No retractions. Lungs CTAB. Gastrointestinal: Soft and nontender. No distention.  Musculoskeletal:Pain with distal left clavicle palpation. Pain with left Tibial palpation Neurologic:  Normal speech and language. No gross focal neurologic deficits are appreciated.  Skin:  Left leg wound hot to touch, blanching surrounding erythema Psychiatric: Mood and affect are normal. Speech and behavior are normal.  ____________________________________________   LABS (all labs ordered are listed, but only abnormal results are displayed)  Labs Reviewed  CBC - Abnormal; Notable for the following components:      Result Value   RBC 3.12 (*)    Hemoglobin 8.9 (*)    HCT 29.3 (*)    RDW 17.7 (*)    All other components within normal limits  COMPREHENSIVE METABOLIC PANEL - Abnormal; Notable for the following components:   Glucose, Bld 255 (*)    BUN 34 (*)    Creatinine, Ser 1.07 (*)    Total Protein 6.4 (*)    Albumin 3.0 (*)    GFR calc non Af Amer 45 (*)    GFR calc Af Amer 53 (*)    All other components within normal limits  GLUCOSE, CAPILLARY - Abnormal; Notable for the following components:   Glucose-Capillary 107 (*)  All other components within normal limits  GLUCOSE, CAPILLARY -  Abnormal; Notable for the following components:   Glucose-Capillary 113 (*)    All other components within normal limits  GLUCOSE, CAPILLARY - Abnormal; Notable for the following components:   Glucose-Capillary 253 (*)    All other components within normal limits  CULTURE, BLOOD (ROUTINE X 2)  CULTURE, BLOOD (ROUTINE X 2)  GLUCOSE, CAPILLARY  CBC  BASIC METABOLIC PANEL     RADIOLOGY I, [place] Ligaya Cormier, personally viewed and evaluated these images (plain radiographs) as part of my medical decision making, as well as reviewing the written report by the radiologist.  ED MD interpretation:  Probable depressed lateral tibial plateau fracture. Nondisplaced distal clavicle fracture  Official radiology report(s): Dg Tibia/fibula Left  Result Date: 09/03/2018 CLINICAL DATA:  Left shoulder pain and left leg pain after a fall 1 week ago. EXAM: LEFT TIBIA AND FIBULA - 2 VIEW COMPARISON:  None. FINDINGS: Diffuse bone demineralization. Degenerative changes in the left knee. Linear sclerosis and irregularity along the lateral tibial plateau suggesting a probable depressed lateral tibial plateau fracture. Remainder of the left tibia and fibula appear intact. Vascular calcifications in the soft tissues. IMPRESSION: Probable depressed lateral tibial plateau fracture. Degenerative changes in the left knee. Electronically Signed   By: Lucienne Capers M.D.   On: 09/03/2018 00:28   Ct Cervical Spine Wo Contrast  Result Date: 09/03/2018 CLINICAL DATA:  Shoulder and leg pain from fall a week ago. Increasing pain in the left shoulder. EXAM: CT CERVICAL SPINE WITHOUT CONTRAST TECHNIQUE: Multidetector CT imaging of the cervical spine was performed without intravenous contrast. Multiplanar CT image reconstructions were also generated. COMPARISON:  01/10/2018 FINDINGS: Alignment: Maintained cervical lordosis. Skull base and vertebrae: Intact skull base. Osteoarthritis of the atlantodental interval. Intact cervical  vertebral bodies without fracture or suspicious osseous lesions. Soft tissues and spinal canal: No prevertebral soft tissue swelling. No visible canal hematoma. Carotid and vertebral arterial calcifications are again noted. Disc levels: Degenerative disc disease with vacuum disc phenomenon and endplate spurring is identified at C5-6. Mild bilateral foraminal encroachment at C5-6. Osteoarthritis of the facets at C3-4 and C4-5, right greater than left. Upper chest: Respiratory motion artifacts. Ground-glass mosaic appearance of the right upper lobe. Other: None IMPRESSION: 1. Cervical spondylosis without acute cervical spine fracture or static listhesis. 2. Mosaic attenuation of the included right upper lobe, nonspecific may represent stigmata of small airway disease, alveolitis/ pneumonitis or edema among some considerations. Electronically Signed   By: Ashley Royalty M.D.   On: 09/03/2018 00:24   Dg Chest Portable 1 View  Result Date: 09/03/2018 CLINICAL DATA:  Increasing left shoulder,. EXAM: PORTABLE CHEST 1 VIEW COMPARISON:  07/05/2018 FINDINGS: Cardiomegaly with moderate aortic atherosclerosis. Mild interstitial edema slightly decreased in appearance from prior. Osteoarthritis of the included acromioclavicular and glenohumeral joints. No displaced fracture is seen. Left-sided pacemaker apparatus with leads in the right atrium and right ventricle are identified. Lower thoracic and lumbar fusion hardware is noted. IMPRESSION: 1. Cardiomegaly with interstitial edema slightly decreased in appearance. 2. Aortic atherosclerosis without aneurysm. 3. 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 Electronically Signed   By: Ashley Royalty M.D.   On: 09/03/2018 00:35   Dg Shoulder Left  Result Date: 09/03/2018 CLINICAL DATA:  Left shoulder pain after fall 1 week ago. EXAM: LEFT SHOULDER - 2+ VIEW COMPARISON:  12/12/2017 FINDINGS: Subtle lucency along the undersurface of the distal clavicle is identified, nonspecific  but possibility of a nondisplaced fracture  is raised. Undersurface spurring with joint space narrowing of the acromioclavicular joint consistent with AC joint osteoarthritis. The glenohumeral joint is intact. Left-sided pacemaker apparatus projects over the axilla limiting assessment in this region. Aortic atherosclerosis without aneurysm is noted. Mild pulmonary vascular congestion is seen. Soft tissues are unremarkable. IMPRESSION: Nondisplaced linear lucency along the undersurface of the distal clavicle is identified, nonspecific but a subtle incomplete fracture is not excluded. A similar finding is not identified on prior comparisons. Vascular channel or cortical groove are among differential possibilities. Osteoarthritis of the acromioclavicular joint. Electronically Signed   By: Ashley Royalty M.D.   On: 09/03/2018 00:29    ____________________________________________   PROCEDURES   Procedure(s) performed (including Critical Care):  Procedures   ____________________________________________   INITIAL IMPRESSION / MDM / [place] / ED COURSE  As part of my medical decision making, I reviewed the following data within the electronic MEDICAL RECORD NUMBER  85 year old female presented with above-stated history and physical exam consistent with left lower extremity cellulitis at previous left leg wound site.  Also left lateral tibial plateau fracture and left clavicular fracture.  Patient given IV morphine and IV clindamycin in the emergency department with pain improvement on reevaluation patient resting comfortably.  Patient discussed with Dr. Aliene Altes for hospital admission for further evaluation and management. ____________________________________________  FINAL CLINICAL IMPRESSION(S) / ED DIAGNOSES  Final diagnoses:  Cellulitis and abscess of left lower extremity  Nondisplaced fracture of shaft of left clavicle, initial encounter for closed fracture  Closed fracture of lateral  portion of left tibial plateau, initial encounter     MEDICATIONS GIVEN DURING THIS VISIT:  Medications  morphine 2 MG/ML injection 2 mg (0 mg Intravenous Hold 09/03/18 0028)  ondansetron (ZOFRAN) injection 4 mg (0 mg Intravenous Hold 09/03/18 0028)  aspirin EC tablet 81 mg (81 mg Oral Not Given 09/03/18 1007)  baclofen (LIORESAL) tablet 10 mg (has no administration in time range)  buPROPion (WELLBUTRIN XL) 24 hr tablet 300 mg (300 mg Oral Given 09/03/18 1007)  busPIRone (BUSPAR) tablet 5 mg (5 mg Oral Given 09/03/18 2116)  carvedilol (COREG) tablet 6.25 mg (6.25 mg Oral Given 09/03/18 1702)  cholecalciferol (VITAMIN D3) tablet 2,000 Units (2,000 Units Oral Not Given 09/03/18 1008)  escitalopram (LEXAPRO) tablet 10 mg (10 mg Oral Not Given 09/03/18 1008)  ferrous sulfate tablet 325 mg (325 mg Oral Given 09/03/18 1009)  polyethylene glycol (MIRALAX / GLYCOLAX) packet 17 g (17 g Oral Not Given 09/03/18 1010)  furosemide (LASIX) tablet 40 mg (40 mg Oral Given 09/03/18 1703)  gabapentin (NEURONTIN) capsule 200 mg (200 mg Oral Given 09/03/18 2115)  insulin glargine (LANTUS) injection 20 Units (20 Units Subcutaneous Given 09/03/18 2116)  ipratropium-albuterol (DUONEB) 0.5-2.5 (3) MG/3ML nebulizer solution 3 mL (has no administration in time range)  magnesium oxide (MAG-OX) tablet 400 mg (400 mg Oral Not Given 09/03/18 1009)  Melatonin TABS 5 mg (5 mg Oral Not Given 09/03/18 2123)  multivitamin with minerals tablet 1 tablet (1 tablet Oral Not Given 09/03/18 1010)  oxyCODONE (Oxy IR/ROXICODONE) immediate release tablet 5-10 mg (10 mg Oral Given 09/03/18 1320)  pantoprazole (PROTONIX) EC tablet 40 mg (40 mg Oral Given 09/03/18 1010)  Rivaroxaban (XARELTO) tablet 15 mg (15 mg Oral Given 09/03/18 1702)  rOPINIRole (REQUIP) tablet 0.5 mg (0.5 mg Oral Given 09/03/18 2116)  spironolactone (ALDACTONE) tablet 12.5 mg (12.5 mg Oral Not Given 09/03/18 1010)  traZODone (DESYREL) tablet 25 mg (25 mg Oral Given 09/03/18 2116)  vitamin B-12 (CYANOCOBALAMIN) tablet 1,000 mcg (1,000 mcg Oral Not Given 09/03/18 1011)  morphine 2 MG/ML injection 2 mg (has no administration in time range)    Or  morphine 4 MG/ML injection 4 mg (has no administration in time range)  clindamycin (CLEOCIN) IVPB 600 mg (600 mg Intravenous New Bag/Given 09/03/18 1702)  senna-docusate (Senokot-S) tablet 1 tablet (has no administration in time range)  bisacodyl (DULCOLAX) EC tablet 5 mg (has no administration in time range)  ondansetron (ZOFRAN) tablet 4 mg (has no administration in time range)    Or  ondansetron (ZOFRAN) injection 4 mg (has no administration in time range)  acetaminophen (TYLENOL) tablet 650 mg (has no administration in time range)    Or  acetaminophen (TYLENOL) suppository 650 mg (has no administration in time range)  insulin aspart (novoLOG) injection 0-15 Units (0 Units Subcutaneous Not Given 09/03/18 1657)  insulin aspart (novoLOG) injection 0-5 Units (3 Units Subcutaneous Given 09/03/18 2116)  morphine 2 MG/ML injection 2 mg (2 mg Intravenous Not Given 09/03/18 0605)  diazepam (VALIUM) tablet 5 mg (has no administration in time range)  Ampicillin-Sulbactam (UNASYN) 3 g in sodium chloride 0.9 % 100 mL IVPB (3 g Intravenous New Bag/Given 09/03/18 2122)     ED Discharge Orders    None       Note:  This document was prepared using Dragon voice recognition software and may include unintentional dictation errors.   Gregor Hams, MD 09/03/18 2252

## 2018-09-02 NOTE — ED Notes (Signed)
[name] [phone] and daughter [name] [phone] called to inform about pt condition. This RN told family that pt had just arrived and would call with information once they had been done. Family verbalized understanding.

## 2018-09-02 NOTE — ED Triage Notes (Signed)
Pt arrived via [facility] from [facility] with c/o shoulder pain and leg pain from fall from a week ago. EMS states that pt has had increasing pain in the left shoulder over the last couple of days as well as increasing pain in the left leg with swelling. Pt had stitches on the left leg and the leg is now red and hot to the touch. Pt AxOx4.

## 2018-09-02 NOTE — ED Notes (Signed)
Patient transported to X-ray 

## 2018-09-03 ENCOUNTER — Encounter: Payer: Self-pay | Admitting: Internal Medicine

## 2018-09-03 DIAGNOSIS — Z823 Family history of stroke: Secondary | ICD-10-CM | POA: Diagnosis not present

## 2018-09-03 DIAGNOSIS — L03116 Cellulitis of left lower limb: Secondary | ICD-10-CM | POA: Diagnosis present

## 2018-09-03 DIAGNOSIS — Z7901 Long term (current) use of anticoagulants: Secondary | ICD-10-CM | POA: Diagnosis not present

## 2018-09-03 DIAGNOSIS — N183 Chronic kidney disease, stage 3 (moderate): Secondary | ICD-10-CM | POA: Diagnosis present

## 2018-09-03 DIAGNOSIS — I5042 Chronic combined systolic (congestive) and diastolic (congestive) heart failure: Secondary | ICD-10-CM | POA: Diagnosis present

## 2018-09-03 DIAGNOSIS — Z955 Presence of coronary angioplasty implant and graft: Secondary | ICD-10-CM | POA: Diagnosis not present

## 2018-09-03 DIAGNOSIS — J449 Chronic obstructive pulmonary disease, unspecified: Secondary | ICD-10-CM | POA: Diagnosis present

## 2018-09-03 DIAGNOSIS — Z7982 Long term (current) use of aspirin: Secondary | ICD-10-CM | POA: Diagnosis not present

## 2018-09-03 DIAGNOSIS — I130 Hypertensive heart and chronic kidney disease with heart failure and stage 1 through stage 4 chronic kidney disease, or unspecified chronic kidney disease: Secondary | ICD-10-CM | POA: Diagnosis present

## 2018-09-03 DIAGNOSIS — M549 Dorsalgia, unspecified: Secondary | ICD-10-CM | POA: Diagnosis present

## 2018-09-03 DIAGNOSIS — Z950 Presence of cardiac pacemaker: Secondary | ICD-10-CM | POA: Diagnosis not present

## 2018-09-03 DIAGNOSIS — W050XXA Fall from non-moving wheelchair, initial encounter: Secondary | ICD-10-CM | POA: Diagnosis present

## 2018-09-03 DIAGNOSIS — S42025D Nondisplaced fracture of shaft of left clavicle, subsequent encounter for fracture with routine healing: Secondary | ICD-10-CM | POA: Diagnosis not present

## 2018-09-03 DIAGNOSIS — Z66 Do not resuscitate: Secondary | ICD-10-CM | POA: Diagnosis present

## 2018-09-03 DIAGNOSIS — Z79899 Other long term (current) drug therapy: Secondary | ICD-10-CM | POA: Diagnosis not present

## 2018-09-03 DIAGNOSIS — S42025A Nondisplaced fracture of shaft of left clavicle, initial encounter for closed fracture: Secondary | ICD-10-CM | POA: Diagnosis present

## 2018-09-03 DIAGNOSIS — M25512 Pain in left shoulder: Secondary | ICD-10-CM | POA: Diagnosis present

## 2018-09-03 DIAGNOSIS — Z833 Family history of diabetes mellitus: Secondary | ICD-10-CM | POA: Diagnosis not present

## 2018-09-03 DIAGNOSIS — S82142A Displaced bicondylar fracture of left tibia, initial encounter for closed fracture: Secondary | ICD-10-CM | POA: Diagnosis present

## 2018-09-03 DIAGNOSIS — I4891 Unspecified atrial fibrillation: Secondary | ICD-10-CM | POA: Diagnosis present

## 2018-09-03 DIAGNOSIS — S82142D Displaced bicondylar fracture of left tibia, subsequent encounter for closed fracture with routine healing: Secondary | ICD-10-CM | POA: Diagnosis not present

## 2018-09-03 DIAGNOSIS — G8929 Other chronic pain: Secondary | ICD-10-CM | POA: Diagnosis present

## 2018-09-03 DIAGNOSIS — Z85828 Personal history of other malignant neoplasm of skin: Secondary | ICD-10-CM | POA: Diagnosis not present

## 2018-09-03 DIAGNOSIS — Z794 Long term (current) use of insulin: Secondary | ICD-10-CM | POA: Diagnosis not present

## 2018-09-03 DIAGNOSIS — Z8249 Family history of ischemic heart disease and other diseases of the circulatory system: Secondary | ICD-10-CM | POA: Diagnosis not present

## 2018-09-03 DIAGNOSIS — L02416 Cutaneous abscess of left lower limb: Secondary | ICD-10-CM | POA: Diagnosis present

## 2018-09-03 LAB — GLUCOSE, CAPILLARY
Glucose-Capillary: 107 mg/dL — ABNORMAL HIGH (ref 70–99)
Glucose-Capillary: 93 mg/dL (ref 70–99)
Glucose-Capillary: 113 mg/dL — ABNORMAL HIGH (ref 70–99)
Glucose-Capillary: 253 mg/dL — ABNORMAL HIGH (ref 70–99)

## 2018-09-03 LAB — COMPREHENSIVE METABOLIC PANEL
ALK PHOS: 81 U/L (ref 38–126)
ALT: 11 U/L (ref 0–44)
ANION GAP: 10 (ref 5–15)
AST: 16 U/L (ref 15–41)
Albumin: 3 g/dL — ABNORMAL LOW (ref 3.5–5.0)
BUN: 34 mg/dL — ABNORMAL HIGH (ref 8–23)
CALCIUM: 8.9 mg/dL (ref 8.9–10.3)
CO2: 28 mmol/L (ref 22–32)
Chloride: 103 mmol/L (ref 98–111)
Creatinine, Ser: 1.07 mg/dL — ABNORMAL HIGH (ref 0.44–1.00)
GFR calc Af Amer: 53 mL/min — ABNORMAL LOW (ref >60)
GFR calc non Af Amer: 45 mL/min — ABNORMAL LOW (ref >60)
Glucose, Bld: 255 mg/dL — ABNORMAL HIGH (ref 70–99)
Potassium: 3.9 mmol/L (ref 3.5–5.1)
Sodium: 141 mmol/L (ref 135–145)
Total Bilirubin: 0.7 mg/dL (ref 0.3–1.2)
Total Protein: 6.4 g/dL — ABNORMAL LOW (ref 6.5–8.1)

## 2018-09-03 LAB — CBC
HCT: 29.3 % — ABNORMAL LOW (ref 36.0–46.0)
Hemoglobin: 8.9 g/dL — ABNORMAL LOW (ref 12.0–15.0)
MCH: 28.5 pg (ref 26.0–34.0)
MCHC: 30.4 g/dL (ref 30.0–36.0)
MCV: 93.9 fL (ref 80.0–100.0)
Platelets: 245 10*3/uL (ref 150–400)
RBC: 3.12 MIL/uL — ABNORMAL LOW (ref 3.87–5.11)
RDW: 17.7 % — ABNORMAL HIGH (ref 11.5–15.5)
WBC: 9.3 10*3/uL (ref 4.0–10.5)
nRBC: 0 % (ref 0.0–0.2)

## 2018-09-03 MED ORDER — INSULIN ASPART 100 UNIT/ML ~~LOC~~ SOLN
0.0000 [IU] | Freq: Every day | SUBCUTANEOUS | Status: DC
  Administered 2018-09-03: 3 [IU] via SUBCUTANEOUS
  Administered 2018-09-04: 2 [IU] via SUBCUTANEOUS
  Administered 2018-09-06: 3 [IU] via SUBCUTANEOUS
  Filled 2018-09-03 (×4): qty 1

## 2018-09-03 MED ORDER — SODIUM CHLORIDE 0.9 % IV SOLN
1.0000 g | Freq: Three times a day (TID) | INTRAVENOUS | Status: DC
  Administered 2018-09-03 (×2): 1 g via INTRAVENOUS
  Filled 2018-09-03 (×4): qty 1

## 2018-09-03 MED ORDER — BUPROPION HCL ER (XL) 150 MG PO TB24
300.0000 mg | ORAL_TABLET | Freq: Every day | ORAL | Status: DC
  Administered 2018-09-03 – 2018-09-07 (×5): 300 mg via ORAL
  Filled 2018-09-03 (×5): qty 2

## 2018-09-03 MED ORDER — CLINDAMYCIN PHOSPHATE 600 MG/50ML IV SOLN
600.0000 mg | Freq: Three times a day (TID) | INTRAVENOUS | Status: DC
  Administered 2018-09-03 – 2018-09-04 (×4): 600 mg via INTRAVENOUS
  Filled 2018-09-03 (×7): qty 50

## 2018-09-03 MED ORDER — SENNOSIDES-DOCUSATE SODIUM 8.6-50 MG PO TABS: 1.0000 | ORAL_TABLET | Freq: Every evening | ORAL | Status: DC | PRN

## 2018-09-03 MED ORDER — ASPIRIN EC 81 MG PO TBEC
81.0000 mg | DELAYED_RELEASE_TABLET | Freq: Every day | ORAL | Status: DC
  Administered 2018-09-04 – 2018-09-07 (×4): 81 mg via ORAL
  Filled 2018-09-03 (×4): qty 1

## 2018-09-03 MED ORDER — SPIRONOLACTONE 25 MG PO TABS
12.5000 mg | ORAL_TABLET | Freq: Every day | ORAL | Status: DC
  Administered 2018-09-04 – 2018-09-07 (×4): 12.5 mg via ORAL
  Filled 2018-09-03 (×3): qty 1
  Filled 2018-09-03: qty 0.5
  Filled 2018-09-03: qty 1
  Filled 2018-09-03 (×4): qty 0.5

## 2018-09-03 MED ORDER — CARVEDILOL 3.125 MG PO TABS
6.2500 mg | ORAL_TABLET | Freq: Two times a day (BID) | ORAL | Status: DC
  Administered 2018-09-03 – 2018-09-07 (×10): 6.25 mg via ORAL
  Filled 2018-09-03 (×10): qty 2

## 2018-09-03 MED ORDER — MORPHINE SULFATE (PF) 2 MG/ML IV SOLN: 2.0000 mg | Freq: Once | INTRAVENOUS | Status: DC

## 2018-09-03 MED ORDER — IPRATROPIUM-ALBUTEROL 0.5-2.5 (3) MG/3ML IN SOLN: 3.0000 mL | Freq: Four times a day (QID) | RESPIRATORY_TRACT | Status: DC | PRN

## 2018-09-03 MED ORDER — BACLOFEN 10 MG PO TABS
10.0000 mg | ORAL_TABLET | Freq: Three times a day (TID) | ORAL | Status: DC | PRN
  Administered 2018-09-04 (×2): 10 mg via ORAL
  Filled 2018-09-03 (×3): qty 1

## 2018-09-03 MED ORDER — CLINDAMYCIN PHOSPHATE 600 MG/50ML IV SOLN: 600.0000 mg | Freq: Once | INTRAVENOUS | Status: DC

## 2018-09-03 MED ORDER — ACETAMINOPHEN 650 MG RE SUPP: 650.0000 mg | Freq: Four times a day (QID) | RECTAL | Status: DC | PRN

## 2018-09-03 MED ORDER — POLYETHYLENE GLYCOL 3350 17 G PO PACK
17.0000 g | PACK | Freq: Every day | ORAL | Status: DC
  Administered 2018-09-04 – 2018-09-06 (×2): 17 g via ORAL
  Filled 2018-09-03 (×3): qty 1

## 2018-09-03 MED ORDER — MELATONIN 5 MG PO TABS
5.0000 mg | ORAL_TABLET | Freq: Every day | ORAL | Status: DC
  Administered 2018-09-05 – 2018-09-06 (×2): 5 mg via ORAL
  Filled 2018-09-03 (×5): qty 1

## 2018-09-03 MED ORDER — ONDANSETRON HCL 4 MG/2ML IJ SOLN
4.0000 mg | Freq: Four times a day (QID) | INTRAMUSCULAR | Status: DC | PRN
  Administered 2018-09-05 (×2): 4 mg via INTRAVENOUS
  Filled 2018-09-03: qty 2

## 2018-09-03 MED ORDER — ESCITALOPRAM OXALATE 10 MG PO TABS
10.0000 mg | ORAL_TABLET | Freq: Every day | ORAL | Status: DC
  Administered 2018-09-04 – 2018-09-07 (×4): 10 mg via ORAL
  Filled 2018-09-03 (×5): qty 1

## 2018-09-03 MED ORDER — BUSPIRONE HCL 10 MG PO TABS
5.0000 mg | ORAL_TABLET | Freq: Two times a day (BID) | ORAL | Status: DC
  Administered 2018-09-03 – 2018-09-07 (×9): 5 mg via ORAL
  Filled 2018-09-03 (×9): qty 1

## 2018-09-03 MED ORDER — FUROSEMIDE 40 MG PO TABS
40.0000 mg | ORAL_TABLET | Freq: Two times a day (BID) | ORAL | Status: DC
  Administered 2018-09-03 – 2018-09-07 (×9): 40 mg via ORAL
  Filled 2018-09-03 (×9): qty 1

## 2018-09-03 MED ORDER — ONDANSETRON HCL 4 MG PO TABS: 4.0000 mg | ORAL_TABLET | Freq: Four times a day (QID) | ORAL | Status: DC | PRN

## 2018-09-03 MED ORDER — MAGNESIUM OXIDE 400 (241.3 MG) MG PO TABS
400.0000 mg | ORAL_TABLET | Freq: Every day | ORAL | Status: DC
  Administered 2018-09-04 – 2018-09-07 (×3): 400 mg via ORAL
  Filled 2018-09-03 (×4): qty 1

## 2018-09-03 MED ORDER — FERROUS SULFATE 325 (65 FE) MG PO TABS
325.0000 mg | ORAL_TABLET | Freq: Every day | ORAL | Status: DC
  Administered 2018-09-03 – 2018-09-07 (×4): 325 mg via ORAL
  Filled 2018-09-03 (×5): qty 1

## 2018-09-03 MED ORDER — SODIUM CHLORIDE 0.9 % IV SOLN
3.0000 g | Freq: Four times a day (QID) | INTRAVENOUS | Status: DC
  Administered 2018-09-03 – 2018-09-04 (×3): 3 g via INTRAVENOUS
  Filled 2018-09-03 (×7): qty 3

## 2018-09-03 MED ORDER — RIVAROXABAN 15 MG PO TABS
15.0000 mg | ORAL_TABLET | Freq: Every day | ORAL | Status: DC
  Administered 2018-09-03 – 2018-09-07 (×5): 15 mg via ORAL
  Filled 2018-09-03 (×6): qty 1

## 2018-09-03 MED ORDER — VITAMIN D 25 MCG (1000 UNIT) PO TABS
2000.0000 [IU] | ORAL_TABLET | Freq: Every day | ORAL | Status: DC
  Administered 2018-09-04 – 2018-09-07 (×3): 2000 [IU] via ORAL
  Filled 2018-09-03 (×4): qty 2

## 2018-09-03 MED ORDER — BISACODYL 5 MG PO TBEC: 5.0000 mg | DELAYED_RELEASE_TABLET | Freq: Every day | ORAL | Status: DC | PRN

## 2018-09-03 MED ORDER — ONDANSETRON HCL 4 MG/2ML IJ SOLN
4.0000 mg | Freq: Once | INTRAMUSCULAR | Status: DC
  Filled 2018-09-03: qty 2

## 2018-09-03 MED ORDER — DIAZEPAM 5 MG PO TABS: 5.0000 mg | ORAL_TABLET | Freq: Three times a day (TID) | ORAL | Status: DC | PRN

## 2018-09-03 MED ORDER — OXYCODONE HCL 5 MG PO TABS
5.0000 mg | ORAL_TABLET | Freq: Four times a day (QID) | ORAL | Status: DC | PRN
  Administered 2018-09-03: 10 mg via ORAL
  Filled 2018-09-03: qty 2

## 2018-09-03 MED ORDER — VITAMIN B-12 1000 MCG PO TABS
1000.0000 ug | ORAL_TABLET | Freq: Every day | ORAL | Status: DC
  Administered 2018-09-04 – 2018-09-07 (×3): 1000 ug via ORAL
  Filled 2018-09-03 (×4): qty 1

## 2018-09-03 MED ORDER — PANTOPRAZOLE SODIUM 40 MG PO TBEC
40.0000 mg | DELAYED_RELEASE_TABLET | Freq: Every day | ORAL | Status: DC
  Administered 2018-09-03 – 2018-09-07 (×5): 40 mg via ORAL
  Filled 2018-09-03 (×4): qty 1

## 2018-09-03 MED ORDER — INSULIN ASPART 100 UNIT/ML ~~LOC~~ SOLN
0.0000 [IU] | Freq: Three times a day (TID) | SUBCUTANEOUS | Status: DC
  Administered 2018-09-04 (×2): 3 [IU] via SUBCUTANEOUS
  Administered 2018-09-05: 11 [IU] via SUBCUTANEOUS
  Administered 2018-09-05: 3 [IU] via SUBCUTANEOUS
  Administered 2018-09-06: 5 [IU] via SUBCUTANEOUS
  Administered 2018-09-06 (×2): 3 [IU] via SUBCUTANEOUS
  Administered 2018-09-07: 5 [IU] via SUBCUTANEOUS
  Filled 2018-09-03 (×8): qty 1

## 2018-09-03 MED ORDER — ADULT MULTIVITAMIN W/MINERALS CH
1.0000 | ORAL_TABLET | Freq: Every day | ORAL | Status: DC
  Administered 2018-09-04 – 2018-09-07 (×2): 1 via ORAL
  Filled 2018-09-03 (×3): qty 1

## 2018-09-03 MED ORDER — MORPHINE SULFATE (PF) 2 MG/ML IV SOLN: 2.0000 mg | INTRAVENOUS | Status: DC | PRN

## 2018-09-03 MED ORDER — GABAPENTIN 100 MG PO CAPS
200.0000 mg | ORAL_CAPSULE | Freq: Every day | ORAL | Status: DC
  Administered 2018-09-03 – 2018-09-06 (×4): 200 mg via ORAL
  Filled 2018-09-03 (×4): qty 2

## 2018-09-03 MED ORDER — ACETAMINOPHEN 325 MG PO TABS
650.0000 mg | ORAL_TABLET | Freq: Four times a day (QID) | ORAL | Status: DC | PRN
  Administered 2018-09-04 (×2): 650 mg via ORAL
  Filled 2018-09-03 (×2): qty 2

## 2018-09-03 MED ORDER — MORPHINE SULFATE (PF) 4 MG/ML IV SOLN: 4.0000 mg | INTRAVENOUS | Status: DC | PRN

## 2018-09-03 MED ORDER — INSULIN GLARGINE 100 UNIT/ML ~~LOC~~ SOLN
20.0000 [IU] | Freq: Every day | SUBCUTANEOUS | Status: DC
  Administered 2018-09-03 – 2018-09-06 (×4): 20 [IU] via SUBCUTANEOUS
  Filled 2018-09-03 (×5): qty 0.2

## 2018-09-03 MED ORDER — ROPINIROLE HCL 1 MG PO TABS
0.5000 mg | ORAL_TABLET | Freq: Every day | ORAL | Status: DC
  Administered 2018-09-03 – 2018-09-06 (×4): 0.5 mg via ORAL
  Filled 2018-09-03 (×4): qty 1

## 2018-09-03 MED ORDER — TRAZODONE HCL 50 MG PO TABS
25.0000 mg | ORAL_TABLET | Freq: Every day | ORAL | Status: DC
  Administered 2018-09-03 – 2018-09-06 (×4): 25 mg via ORAL
  Filled 2018-09-03 (×4): qty 1

## 2018-09-03 NOTE — Progress Notes (Signed)
Please note, patient is currently followed by outpatient Palliative at [facility]. CMRN Deliliah Belenda Cruise and CSW Evette Cristal made aware.  Flo Shanks RN, BSN, [facility] Liaison [facility] ([facility] [place] [phone]

## 2018-09-03 NOTE — Progress Notes (Signed)
Advanced care plan.  Purpose of the Encounter: CODE STATUS  Parties in Attendance: Patient herself  Patient's Decision Capacity: Intact  Subjective/Patient's story:  Patient is 85 year old with history of hypertension, diabetes, COPD, congestive heart failure who is presenting to the hospital with complaint of pain in her ankle and clavicle.  And a possible cellulitis.  Patient has chronic medical problems as stated above.  There is a concern that she may have clavicular and ankle fracture.  Has been seen by orthopedics they do not feel that patient has a fracture.  Objective/Medical story I discussed with the patient regarding her desires for cardiac and pulmonary resuscitation she states that she does not want to be resuscitated.  Also discussed with her regarding her goals of care she states that she has healthcare power of attorney is her daughter.  And has a living will stating her wishes which is in place.   Goals of care determination:  DNR   CODE STATUS: DNR   Time spent discussing advanced care planning: 16 minutes

## 2018-09-03 NOTE — Consult Note (Signed)
ORTHOPAEDIC CONSULTATION  REQUESTING PHYSICIAN: Dustin Flock, MD  Chief Complaint: Left knee and shoulder pain  HPI: [name] is a  85 year old female who fell at her assisted living facility about a week ago 08/26/2018.  She had a laceration on the outer aspect of the left lower leg.  This was sutured but she returned to the emergency room last night due to worries of about cellulitis.  Admitted for IV antibiotics.  He has complained about some left knee pain and shoulder pain.  X-rays of the left knee show questionable in fraction of the lateral tibial plateau.  X-rays left clavicle and shoulder show osteoarthritis at the [facility] joint.  Radiologist raised the question of a slight occult fracture line in the distal clavicle.  Today she does not complain of any significant knee pain.  She does have pain with movement of the shoulder.  Her daughter is present today to discuss treatment.  The laceration is uncovered.  Her daughter says that the redness is better today.  She is afebrile and the white count is normal at 9300.   Past Medical History:  Diagnosis Date  . Cancer (May)    skin ca  . CHF (congestive heart failure) ([place])   . Chronic back pain   . COPD (chronic obstructive pulmonary disease) ([place])   . Diabetes mellitus without complication ([place])   . Hypertension   . Pacemaker    2015  . Poor perfusion of leg    Past Surgical History:  Procedure Laterality Date  . ABDOMINAL HYSTERECTOMY    . BACK SURGERY     x3  . CORONARY STENT PLACEMENT     unknown location per pt  . FOOT SURGERY    . LEG SURGERY    . PACEMAKER PLACEMENT    . TONSILLECTOMY     Social History   Socioeconomic History  . Marital status: Married    Spouse name: Not on file  . Number of children: Not on file  . Years of education: Not on file  . Highest education level: Not on file  Occupational History  . Not on file  Social Needs  . Financial resource strain: Not on file  . Food insecurity:     Worry: Not on file    Inability: Not on file  . Transportation needs:    Medical: Not on file    Non-medical: Not on file  Tobacco Use  . Smoking status: Never Smoker  . Smokeless tobacco: Never Used  Substance and Sexual Activity  . Alcohol use: No    Alcohol/week: 0.0 standard drinks  . Drug use: No  . Sexual activity: Not on file  Lifestyle  . Physical activity:    Days per week: Not on file    Minutes per session: Not on file  . Stress: Not on file  Relationships  . Social connections:    Talks on phone: Not on file    Gets together: Not on file    Attends religious service: Not on file    Active member of club or organization: Not on file    Attends meetings of clubs or organizations: Not on file    Relationship status: Not on file  Other Topics Concern  . Not on file  Social History Narrative  . Not on file   Family History  Problem Relation Age of Onset  . CVA Mother        Congestive Heart Failure, heart attack, hypertension, stroke  .  Diabetes Mellitus II Sister   . Heart disease Father        Congestive Heart Failure, heart attack, hypertension   Allergies  Allergen Reactions  . Ceftriaxone Swelling    Tolerates piperacillin/tazobactam, PCN G  . Cefuroxime Hives    Tolerates piperacillin/tazobactam, PCN G  . Cephalosporins Swelling and Other (See Comments)    Reaction:  Fainting/dry mouth  Tolerates piperacillin/tazobactam, PCN G  . Codeine Nausea And Vomiting  . Epinephrine Other (See Comments)    Reaction:  Fainting   . Morphine And Related Other (See Comments)    Reaction:  GI upset   . Buprenorphine Hcl Nausea And Vomiting  . Ciprofloxacin Itching, Swelling and Rash  . Latex Rash  . Lidocaine Rash  . Procaine Rash   Prior to Admission medications   Medication Sig Start Date End Date Taking? Authorizing Provider  acetaminophen (TYLENOL) 325 MG tablet Take 2 tablets (650 mg total) by mouth every 6 (six) hours as needed for mild pain (or  Fever >/= 101). 04/18/17   Nicholes Mango, MD  amoxicillin (AMOXIL) 875 MG tablet Take 1 tablet (875 mg total) by mouth 2 (two) times daily. 08/26/18   Carrie Mew, MD  aspirin EC 81 MG tablet Take 81 mg by mouth daily.    [provider]  baclofen (LIORESAL) 10 MG tablet Take 10 mg by mouth 3 (three) times daily as needed (pain).     [provider]  buPROPion (WELLBUTRIN XL) 300 MG 24 hr tablet Take 300 mg by mouth daily.  11/28/17   [provider]  busPIRone (BUSPAR) 5 MG tablet Take 5 mg by mouth 2 (two) times daily.    [provider]  carvedilol (COREG) 6.25 MG tablet Take 6.25 mg by mouth 2 (two) times daily with a meal.     [provider]  cholecalciferol (VITAMIN D) 1000 units tablet Take 2,000 Units by mouth daily.     [provider]  docusate sodium (COLACE) 100 MG capsule Take 100 mg by mouth 2 (two) times daily.     [provider]  escitalopram (LEXAPRO) 10 MG tablet Take 10 mg by mouth daily.    [provider]  ferrous sulfate 325 (65 FE) MG tablet Take 325 mg by mouth daily with breakfast.    [provider]  fexofenadine (ALLEGRA) 60 MG tablet Take 60 mg by mouth 2 (two) times daily.    [provider]  furosemide (LASIX) 40 MG tablet Take 1 tablet (40 mg total) by mouth 2 (two) times daily. 07/09/18   Gladstone Lighter, MD  gabapentin (NEURONTIN) 100 MG capsule Take 100-200 mg by mouth See admin instructions. Take 1 capsule (100mg ) by mouth every night at bedtime and 1 additional capsule by mouth at bedtime as needed for leg pain    [provider]  insulin glargine (LANTUS) 100 UNIT/ML injection Inject 30 Units into the skin at bedtime.    [provider]  ipratropium-albuterol (DUONEB) 0.5-2.5 (3) MG/3ML SOLN Take 3 mLs by nebulization every 6 (six) hours as needed (wheezing). Patient taking differently: Take 3 mLs by nebulization 4 (four) times daily as needed  (shortness of breath).  07/09/18   Gladstone Lighter, MD  magnesium oxide (MAG-OX) 400 MG tablet Take 400 mg by mouth daily.    [provider]  Melatonin 3 MG TABS Take 3 mg by mouth at bedtime.     [provider]  metFORMIN (GLUCOPHAGE-XR) 750 MG 24 hr tablet  Take 750 mg by mouth daily.    [provider]  metoCLOPramide (REGLAN) 5 MG tablet Take 5 mg by mouth every 6 (six) hours as needed for nausea or vomiting.    [provider]  Multiple Vitamin (MULTIVITAMIN) tablet Take 1 tablet by mouth daily.    [provider]  oxyCODONE (OXY IR/ROXICODONE) 5 MG immediate release tablet Take 5-10 mg by mouth every 6 (six) hours as needed for moderate pain or severe pain.    [provider]  oxycodone (OXY-IR) 5 MG capsule Take 1-2 capsules (5-10 mg total) by mouth every 6 (six) hours as needed (MOD SEVERE pain). 07/09/18   Gladstone Lighter, MD  pantoprazole (PROTONIX) 40 MG tablet Take 40 mg by mouth daily.    [provider]  polyethylene glycol (MIRALAX / GLYCOLAX) packet Take 17 g by mouth daily.    [provider]  PROAIR HFA 108 [phone] Base) MCG/ACT inhaler Inhale 2 puffs into the lungs every 6 (six) hours as needed for wheezing or shortness of breath.  10/02/17   [provider]  Rivaroxaban (XARELTO) 15 MG TABS tablet Take 15 mg by mouth daily.    [provider]  rOPINIRole (REQUIP) 0.5 MG tablet Take 0.5 mg by mouth at bedtime.     [provider]  senna (SENOKOT) 8.6 MG TABS tablet Take 1 tablet (8.6 mg total) by mouth daily. 07/10/18   Gladstone Lighter, MD  sodium phosphate (FLEET) 7-19 GM/118ML ENEM Place 1 enema rectally daily as needed for severe constipation.    [provider]  spironolactone (ALDACTONE) 25 MG tablet Take 0.5 tablets (12.5 mg total) by mouth daily. 05/18/18   Salary, Holly Bodily D, MD  traZODone (DESYREL) 50 MG tablet Take 25 mg by mouth at bedtime.    [provider]  vitamin B-12 (CYANOCOBALAMIN) 1000 MCG tablet Take 1,000 mcg by mouth daily.    [provider]  vitamin C (ASCORBIC ACID) 500 MG tablet Take 500 mg by mouth daily.    [provider]  Vitamin D, Ergocalciferol, (DRISDOL) 50000 UNITS CAPS capsule Take 50,000 Units by mouth every Thursday.     [provider]   Dg Tibia/fibula Left  Result Date: 09/03/2018 CLINICAL DATA:  Left shoulder pain and left leg pain after a fall 1 week ago. EXAM: LEFT TIBIA AND FIBULA - 2 VIEW COMPARISON:  None. FINDINGS: Diffuse bone demineralization. Degenerative changes in the left knee. Linear sclerosis and irregularity along the lateral tibial plateau suggesting a probable depressed lateral tibial plateau fracture. Remainder of the left tibia and fibula appear intact. Vascular calcifications in the soft tissues. IMPRESSION: Probable depressed lateral tibial plateau fracture. Degenerative changes in the left knee. Electronically Signed   By: Lucienne Capers M.D.   On: 09/03/2018 00:28   Ct Cervical Spine Wo Contrast  Result Date: 09/03/2018 CLINICAL DATA:  Shoulder and leg pain from fall a week ago. Increasing pain in the left shoulder. EXAM: CT CERVICAL SPINE WITHOUT CONTRAST TECHNIQUE: Multidetector CT imaging of the cervical spine was performed without intravenous contrast. Multiplanar CT image reconstructions were also generated. COMPARISON:  01/10/2018 FINDINGS: Alignment: Maintained cervical lordosis. Skull base and vertebrae: Intact skull base. Osteoarthritis of the atlantodental interval. Intact cervical vertebral bodies without fracture or suspicious osseous lesions. Soft tissues and spinal canal: No prevertebral soft tissue swelling. No visible canal hematoma. Carotid and vertebral arterial calcifications are again noted. Disc levels: Degenerative disc disease with vacuum disc phenomenon and endplate spurring is  identified at C5-6. Mild bilateral foraminal encroachment at  C5-6. Osteoarthritis of the facets at C3-4 and C4-5, right greater than left. Upper chest: Respiratory motion artifacts. Ground-glass mosaic appearance of the right upper lobe. Other: None IMPRESSION: 1. Cervical spondylosis without acute cervical spine fracture or static listhesis. 2. Mosaic attenuation of the included right upper lobe, nonspecific may represent stigmata of small airway disease, alveolitis/ pneumonitis or edema among some considerations. Electronically Signed   By: Ashley Royalty M.D.   On: 09/03/2018 00:24   Dg Chest Portable 1 View  Result Date: 09/03/2018 CLINICAL DATA:  Increasing left shoulder,. EXAM: PORTABLE CHEST 1 VIEW COMPARISON:  07/05/2018 FINDINGS: Cardiomegaly with moderate aortic atherosclerosis. Mild interstitial edema slightly decreased in appearance from prior. Osteoarthritis of the included acromioclavicular and glenohumeral joints. No displaced fracture is seen. Left-sided pacemaker apparatus with leads in the right atrium and right ventricle are identified. Lower thoracic and lumbar fusion hardware is noted. IMPRESSION: 1. Cardiomegaly with interstitial edema slightly decreased in appearance. 2. Aortic atherosclerosis without aneurysm. 3. 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 Electronically Signed   By: Ashley Royalty M.D.   On: 09/03/2018 00:35   Dg Shoulder Left  Result Date: 09/03/2018 CLINICAL DATA:  Left shoulder pain after fall 1 week ago. EXAM: LEFT SHOULDER - 2+ VIEW COMPARISON:  12/12/2017 FINDINGS: Subtle lucency along the undersurface of the distal clavicle is identified, nonspecific but possibility of a nondisplaced fracture is raised. Undersurface spurring with joint space narrowing of the acromioclavicular joint consistent with AC joint osteoarthritis. The glenohumeral joint is intact. Left-sided pacemaker apparatus projects over the axilla limiting assessment in this region. Aortic atherosclerosis without aneurysm is noted. Mild pulmonary vascular  congestion is seen. Soft tissues are unremarkable. IMPRESSION: Nondisplaced linear lucency along the undersurface of the distal clavicle is identified, nonspecific but a subtle incomplete fracture is not excluded. A similar finding is not identified on prior comparisons. Vascular channel or cortical groove are among differential possibilities. Osteoarthritis of the acromioclavicular joint. Electronically Signed   By: Ashley Royalty M.D.   On: 09/03/2018 00:29    Positive ROS: All other systems have been reviewed and were otherwise negative with the exception of those mentioned in the HPI and as above.  Physical Exam: General: Alert, no acute distress Cardiovascular: No pedal edema Respiratory: No cyanosis, no use of accessory musculature GI: No organomegaly, abdomen is soft and non-tender Skin: No lesions in the area of chief complaint Neurologic: Sensation intact distally Psychiatric: Patient is competent for consent with normal mood and affect Lymphatic: No axillary or cervical lymphadenopathy  MUSCULOSKELETAL: The laceration on the outer aspect of the left lower leg is clean and dry with some eschar.  There is no redness or warmth around it.  Is minimal tenderness.  The knee has good range of motion with no significant pain.  There is no bruising or swelling.  Left shoulder shows some tenderness over the anterior joint capsule.  The clavicle is not very tender.  Range of motion is fair with mild pain.  Is no redness.  Assessment: Sprain of the left shoulder possible occult clavicle fracture Sprain of the left knee with no real sign of fracture  Plan: Patient's wound needs to be dressed. She can ambulate with a walker using weightbearing as tolerated on the left. She should return to my office in 7 to 10 days for exam and x-rays. Continue antibiotics as needed for cellulitis.    Park Breed, MD [phone]   09/03/2018  12:47 PM

## 2018-09-03 NOTE — Progress Notes (Addendum)
Patient is 85 year old admitted with lower extremity cellulitis and pain in her ankle and clavicle.  1.  Left ankle cellulitis appears to have improved continue current antibiotics likely switch to oral tomorrow 2.  Sprain of the left shoulder with possible occult fracture supportive care no sling recommended per Ortho 3.  Sprain of the left knee with no sign of fracture ambulated with walker using weightbearing as tolerated on the left per Ortho 4.  History of congestive heart heart failure continue Lasix 5.  Diabetes type 2 continue sliding-scale insulin and Lantus, continue metformin 6.  History of COPD without exacerbation continue nebulizers as needed 7.  History of A. fib continue Xarelto as doing at home

## 2018-09-03 NOTE — ED Notes (Signed)
Went to give IV morphine, pt was sound asleep with no distress noted. Pt desatted to 89% but upon arousal pt would go back to 94% O2. Pt put on 2 L [place] and hold medication per MD Owens Shark.

## 2018-09-03 NOTE — H&P (Signed)
[facility] [place] [name]    MR#:  [id number]  DATE OF BIRTH:  [birth year]  DATE OF ADMISSION:  09/02/2018  PRIMARY CARE PHYSICIAN: [facility]   REQUESTING/REFERRING PHYSICIAN: Gregor Hams, MD  CHIEF COMPLAINT:   Chief Complaint  Patient presents with   Shoulder Pain   Leg Swelling    HISTORY OF PRESENT ILLNESS:  [name]  is a 85 y.o. female with a known history of AFib (Xarelto) p/w LLE trauma/wound, possible Fx, superimposed cellulitis. I believe she resides at [facility]. Pt states that she fell out of her wheelchair ~2wks ago. States that she was getting up out of the wheelchair, and her L leg got caught against the metal, resulting in local traumatic injury and laceration of the L anterolateral shin. She also hurt her L shoulder at the time. Seen in [facility] ED 03/04. Laceration was closed and pt was D/Ced. Endorses erythema, warmth, swelling and worsening discomfort at wound site during the day (Wed 09/02/2018). Denies purulent drainage, and says that the scab appears as dried blood. (+) TTP, (-) fluctuance/drainage. Denies fever, chills, diaphoresis, night sweats, rigors. States she received a tetanus shot after her injury on 03/04. I do not believe imaging was performed on that visit. Imaging of LLE performed on present visit demonstrates, Probable depressed lateral tibial plateau fracture. Degenerative changes in the left knee. Imaging of L shoulder performed on present visit demonstrates, Nondisplaced linear lucency along the undersurface of the distal clavicle is identified, nonspecific but a subtle incomplete fracture is not excluded. A similar finding is not identified on prior comparisons. Vascular channel or cortical groove are among differential possibilities. Osteoarthritis of the acromioclavicular joint. As neither of these constitutes an emergency, Orthopedic Surgery was not contacted from  the ED. Apart from her L shoulder pain and LLE pain/swelling/erythema, she is w/o complaint.  PAST MEDICAL HISTORY:   Past Medical History:  Diagnosis Date   Cancer ([place])    skin ca   CHF (congestive heart failure) (HCC)    Chronic back pain    COPD (chronic obstructive pulmonary disease) (HCC)    Diabetes mellitus without complication ([place])    Hypertension    Pacemaker    2015   Poor perfusion of leg     PAST SURGICAL HISTORY:   Past Surgical History:  Procedure Laterality Date   ABDOMINAL HYSTERECTOMY     BACK SURGERY     x3   CORONARY STENT PLACEMENT     unknown location per pt   FOOT SURGERY     LEG SURGERY     PACEMAKER PLACEMENT     TONSILLECTOMY      SOCIAL HISTORY:   Social History   Tobacco Use   Smoking status: Never Smoker   Smokeless tobacco: Never Used  Substance Use Topics   Alcohol use: No    Alcohol/week: 0.0 standard drinks    FAMILY HISTORY:   Family History  Problem Relation Age of Onset   CVA Mother        Congestive Heart Failure, heart attack, hypertension, stroke   Diabetes Mellitus II Sister    Heart disease Father        Congestive Heart Failure, heart attack, hypertension    DRUG ALLERGIES:   Allergies  Allergen Reactions   Ceftriaxone Swelling    Tolerates piperacillin/tazobactam, PCN G   Cefuroxime Hives    Tolerates piperacillin/tazobactam, PCN G   Cephalosporins Swelling  and Other (See Comments)    Reaction:  Fainting/dry mouth  Tolerates piperacillin/tazobactam, PCN G   Codeine Nausea And Vomiting   Epinephrine Other (See Comments)    Reaction:  Fainting    Morphine And Related Other (See Comments)    Reaction:  GI upset    Buprenorphine Hcl Nausea And Vomiting   Ciprofloxacin Itching, Swelling and Rash   Latex Rash   Lidocaine Rash   Procaine Rash    REVIEW OF SYSTEMS:   Review of Systems  Constitutional: Negative for chills, diaphoresis, fever, malaise/fatigue and weight  loss.  HENT: Negative for congestion, ear pain, hearing loss, nosebleeds, sinus pain, sore throat and tinnitus.   Eyes: Negative for blurred vision, double vision and photophobia.  Respiratory: Negative for cough, hemoptysis, sputum production, shortness of breath and wheezing.   Cardiovascular: Negative for chest pain, palpitations, orthopnea, claudication, leg swelling and PND.  Gastrointestinal: Negative for abdominal pain, blood in stool, constipation, diarrhea, heartburn, melena, nausea and vomiting.  Genitourinary: Negative for dysuria, frequency, hematuria and urgency.  Musculoskeletal: Positive for falls and joint pain. Negative for back pain, myalgias and neck pain.  Skin: Negative for itching and rash.  Neurological: Negative for dizziness, tingling, tremors, sensory change, speech change, focal weakness, seizures, loss of consciousness, weakness and headaches.  Psychiatric/Behavioral: Negative for depression and memory loss. The patient is not nervous/anxious and does not have insomnia.    MEDICATIONS AT HOME:   Prior to Admission medications   Medication Sig Start Date End Date Taking? Authorizing Provider  acetaminophen (TYLENOL) 325 MG tablet Take 2 tablets (650 mg total) by mouth every 6 (six) hours as needed for mild pain (or Fever >/= 101). 04/18/17   Nicholes Mango, MD  amoxicillin (AMOXIL) 875 MG tablet Take 1 tablet (875 mg total) by mouth 2 (two) times daily. 08/26/18   Carrie Mew, MD  aspirin EC 81 MG tablet Take 81 mg by mouth daily.    [provider]  baclofen (LIORESAL) 10 MG tablet Take 10 mg by mouth 3 (three) times daily as needed (pain).     [provider]  buPROPion (WELLBUTRIN XL) 300 MG 24 hr tablet Take 300 mg by mouth daily.  11/28/17   [provider]  busPIRone (BUSPAR) 5 MG tablet Take 5 mg by mouth 2 (two) times daily.    [provider]  carvedilol (COREG) 6.25 MG tablet Take 6.25 mg by mouth 2 (two) times daily with  a meal.     [provider]  cholecalciferol (VITAMIN D) 1000 units tablet Take 2,000 Units by mouth daily.     [provider]  docusate sodium (COLACE) 100 MG capsule Take 100 mg by mouth 2 (two) times daily.     [provider]  escitalopram (LEXAPRO) 10 MG tablet Take 10 mg by mouth daily.    [provider]  ferrous sulfate 325 (65 FE) MG tablet Take 325 mg by mouth daily with breakfast.    [provider]  fexofenadine (ALLEGRA) 60 MG tablet Take 60 mg by mouth 2 (two) times daily.    [provider]  furosemide (LASIX) 40 MG tablet Take 1 tablet (40 mg total) by mouth 2 (two) times daily. 07/09/18   Gladstone Lighter, MD  gabapentin (NEURONTIN) 100 MG capsule Take 100-200 mg by mouth See admin instructions. Take 1 capsule (100mg ) by mouth every night at bedtime and 1 additional capsule by mouth at bedtime as needed for leg pain  [provider]  insulin glargine (LANTUS) 100 UNIT/ML injection Inject 30 Units into the skin at bedtime.    [provider]  ipratropium-albuterol (DUONEB) 0.5-2.5 (3) MG/3ML SOLN Take 3 mLs by nebulization every 6 (six) hours as needed (wheezing). Patient taking differently: Take 3 mLs by nebulization 4 (four) times daily as needed (shortness of breath).  07/09/18   Gladstone Lighter, MD  magnesium oxide (MAG-OX) 400 MG tablet Take 400 mg by mouth daily.    [provider]  Melatonin 3 MG TABS Take 3 mg by mouth at bedtime.     [provider]  metFORMIN (GLUCOPHAGE-XR) 750 MG 24 hr tablet Take 750 mg by mouth daily.    [provider]  metoCLOPramide (REGLAN) 5 MG tablet Take 5 mg by mouth every 6 (six) hours as needed for nausea or vomiting.    [provider]  Multiple Vitamin (MULTIVITAMIN) tablet Take 1 tablet by mouth daily.    [provider]  oxyCODONE (OXY IR/ROXICODONE) 5 MG immediate release tablet Take 5-10 mg by mouth every 6 (six)  hours as needed for moderate pain or severe pain.    [provider]  oxycodone (OXY-IR) 5 MG capsule Take 1-2 capsules (5-10 mg total) by mouth every 6 (six) hours as needed (MOD SEVERE pain). 07/09/18   Gladstone Lighter, MD  pantoprazole (PROTONIX) 40 MG tablet Take 40 mg by mouth daily.    [provider]  polyethylene glycol (MIRALAX / GLYCOLAX) packet Take 17 g by mouth daily.    [provider]  PROAIR HFA 108 [phone] Base) MCG/ACT inhaler Inhale 2 puffs into the lungs every 6 (six) hours as needed for wheezing or shortness of breath.  10/02/17   [provider]  Rivaroxaban (XARELTO) 15 MG TABS tablet Take 15 mg by mouth daily.    [provider]  rOPINIRole (REQUIP) 0.5 MG tablet Take 0.5 mg by mouth at bedtime.     [provider]  senna (SENOKOT) 8.6 MG TABS tablet Take 1 tablet (8.6 mg total) by mouth daily. 07/10/18   Gladstone Lighter, MD  sodium phosphate (FLEET) 7-19 GM/118ML ENEM Place 1 enema rectally daily as needed for severe constipation.    [provider]  spironolactone (ALDACTONE) 25 MG tablet Take 0.5 tablets (12.5 mg total) by mouth daily. 05/18/18   Salary, Holly Bodily D, MD  traZODone (DESYREL) 50 MG tablet Take 25 mg by mouth at bedtime.    [provider]  vitamin B-12 (CYANOCOBALAMIN) 1000 MCG tablet Take 1,000 mcg by mouth daily.    [provider]  vitamin C (ASCORBIC ACID) 500 MG tablet Take 500 mg by mouth daily.    [provider]  Vitamin D, Ergocalciferol, (DRISDOL) 50000 UNITS CAPS capsule Take 50,000 Units by mouth every Thursday.     [provider]      VITAL SIGNS:  Blood pressure (!) 145/55, pulse 71, temperature 97.6 F (36.4 C), resp. rate 18, height 5\' 7"  (1.702 m), weight 77 kg, SpO2 99 %.  PHYSICAL EXAMINATION:  Physical Exam Constitutional:      General: She is not in acute distress.    Appearance: She is not ill-appearing, toxic-appearing or  diaphoretic.  HENT:     Head: Atraumatic.     Mouth/Throat:     Pharynx: Oropharynx is clear.  Eyes:     General: No scleral icterus.    Extraocular Movements: Extraocular movements intact.     Conjunctiva/sclera: Conjunctivae normal.  Neck:  Musculoskeletal: Neck supple.  Cardiovascular:     Rate and Rhythm: Tachycardia present. Rhythm irregular.     Heart sounds: Murmur present. Systolic murmur present with a grade of 2/6. No diastolic murmur. No friction rub. No gallop. No S3 or S4 sounds.   Pulmonary:     Effort: Pulmonary effort is normal. No tachypnea or respiratory distress.     Breath sounds: Normal breath sounds and air entry. No stridor. No decreased breath sounds, wheezing, rhonchi or rales.  Abdominal:     General: There is no distension.     Palpations: Abdomen is soft.     Tenderness: There is no abdominal tenderness. There is no guarding or rebound.  Musculoskeletal:        General: Tenderness and signs of injury present.  Lymphadenopathy:     Cervical: No cervical adenopathy.  Skin:    General: Skin is warm and dry.  Neurological:     Mental Status: She is alert. Mental status is at baseline.  Psychiatric:        Mood and Affect: Mood normal.        Behavior: Behavior normal. Behavior is cooperative.        Thought Content: Thought content normal.        Judgment: Judgment normal.   Tachycardic and irregular at the time of my assessment. II/VI systolic murmur. L anterolateral shin wound w/ dried blood/scab, (+) TTP, (+) swelling/erythema; warm to touch, (-) fluctuance, (-) drainage. LABORATORY PANEL:   CBC Recent Labs  Lab 09/03/18 0019  WBC 9.3  HGB 8.9*  HCT 29.3*  PLT 245   ------------------------------------------------------------------------------------------------------------------  Chemistries  Recent Labs  Lab 09/03/18 0019  NA 141  K 3.9  CL 103  CO2 28  GLUCOSE 255*  BUN 34*  CREATININE 1.07*  CALCIUM 8.9  AST 16  ALT 11    ALKPHOS 81  BILITOT 0.7   ------------------------------------------------------------------------------------------------------------------  Cardiac Enzymes No results for input(s): TROPONINI in the last 168 hours. ------------------------------------------------------------------------------------------------------------------  RADIOLOGY:  Dg Tibia/fibula Left  Result Date: 09/03/2018 CLINICAL DATA:  Left shoulder pain and left leg pain after a fall 1 week ago. EXAM: LEFT TIBIA AND FIBULA - 2 VIEW COMPARISON:  None. FINDINGS: Diffuse bone demineralization. Degenerative changes in the left knee. Linear sclerosis and irregularity along the lateral tibial plateau suggesting a probable depressed lateral tibial plateau fracture. Remainder of the left tibia and fibula appear intact. Vascular calcifications in the soft tissues. IMPRESSION: Probable depressed lateral tibial plateau fracture. Degenerative changes in the left knee. Electronically Signed   By: Lucienne Capers M.D.   On: 09/03/2018 00:28   Ct Cervical Spine Wo Contrast  Result Date: 09/03/2018 CLINICAL DATA:  Shoulder and leg pain from fall a week ago. Increasing pain in the left shoulder. EXAM: CT CERVICAL SPINE WITHOUT CONTRAST TECHNIQUE: Multidetector CT imaging of the cervical spine was performed without intravenous contrast. Multiplanar CT image reconstructions were also generated. COMPARISON:  01/10/2018 FINDINGS: Alignment: Maintained cervical lordosis. Skull base and vertebrae: Intact skull base. Osteoarthritis of the atlantodental interval. Intact cervical vertebral bodies without fracture or suspicious osseous lesions. Soft tissues and spinal canal: No prevertebral soft tissue swelling. No visible canal hematoma. Carotid and vertebral arterial calcifications are again noted. Disc levels: Degenerative disc disease with vacuum disc phenomenon and endplate spurring is identified at C5-6. Mild bilateral foraminal encroachment at C5-6.  Osteoarthritis of the facets at C3-4 and C4-5, right greater than left. Upper chest: Respiratory motion artifacts. Ground-glass mosaic appearance of  the right upper lobe. Other: None IMPRESSION: 1. Cervical spondylosis without acute cervical spine fracture or static listhesis. 2. Mosaic attenuation of the included right upper lobe, nonspecific may represent stigmata of small airway disease, alveolitis/ pneumonitis or edema among some considerations. Electronically Signed   By: Ashley Royalty M.D.   On: 09/03/2018 00:24   Dg Chest Portable 1 View  Result Date: 09/03/2018 CLINICAL DATA:  Increasing left shoulder,. EXAM: PORTABLE CHEST 1 VIEW COMPARISON:  07/05/2018 FINDINGS: Cardiomegaly with moderate aortic atherosclerosis. Mild interstitial edema slightly decreased in appearance from prior. Osteoarthritis of the included acromioclavicular and glenohumeral joints. No displaced fracture is seen. Left-sided pacemaker apparatus with leads in the right atrium and right ventricle are identified. Lower thoracic and lumbar fusion hardware is noted. IMPRESSION: 1. Cardiomegaly with interstitial edema slightly decreased in appearance. 2. Aortic atherosclerosis without aneurysm. 3. 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 Electronically Signed   By: Ashley Royalty M.D.   On: 09/03/2018 00:35   Dg Shoulder Left  Result Date: 09/03/2018 CLINICAL DATA:  Left shoulder pain after fall 1 week ago. EXAM: LEFT SHOULDER - 2+ VIEW COMPARISON:  12/12/2017 FINDINGS: Subtle lucency along the undersurface of the distal clavicle is identified, nonspecific but possibility of a nondisplaced fracture is raised. Undersurface spurring with joint space narrowing of the acromioclavicular joint consistent with AC joint osteoarthritis. The glenohumeral joint is intact. Left-sided pacemaker apparatus projects over the axilla limiting assessment in this region. Aortic atherosclerosis without aneurysm is noted. Mild pulmonary vascular  congestion is seen. Soft tissues are unremarkable. IMPRESSION: Nondisplaced linear lucency along the undersurface of the distal clavicle is identified, nonspecific but a subtle incomplete fracture is not excluded. A similar finding is not identified on prior comparisons. Vascular channel or cortical groove are among differential possibilities. Osteoarthritis of the acromioclavicular joint. Electronically Signed   By: Ashley Royalty M.D.   On: 09/03/2018 00:29   IMPRESSION AND PLAN:   A/P: 89F w/ PMHx AFib (Xarelto) p/w LLE trauma/wound, possible Fx, superimposed cellulitis, SIRS (-). Hyperglycemia (w/ T2IDDM), Cr elevation/CKD III, hypoproteinemia/hypoalbuminemia, normocytic anemia. -LLE trauma/wound, possible Fx, superimposed cellulitis, SIRS (-): Recent LLE trauma w/ laceration, repaired in [facility] ED 03/04. Now p/w superimposed cellulitis. SIRS (-), (-) sepsis. Imaging of LLE performed on present visit demonstrates, Probable depressed lateral tibial plateau fracture. Degenerative changes in the left knee. Orthopedic surgery not contacted as yet. Pain ctrl. Clindamycin + Aztreonam (pt diabetic). -L shoulder pain: Imaging of L shoulder performed on present visit demonstrates, Nondisplaced linear lucency along the undersurface of the distal clavicle is identified, nonspecific but a subtle incomplete fracture is not excluded. A similar finding is not identified on prior comparisons. Vascular channel or cortical groove are among differential possibilities. Osteoarthritis of the acromioclavicular joint. Orthopedic surgery not contacted as yet. Pain ctrl. -Hyperglycemia, T2IDDM: SSI. Lantus. -Cr elevation, CKD III: Cr 1.07 on present admission. At baseline. Underlying CKD III (2/2 DM, HTN, aged kidney). Monitor BMP, avoid nephrotoxins. -Hypoproteinemia, hypoalbuminemia: Prealbumin. -Normocytic anemia: Likely anemia of chronic disease. Hgb 8.9. No obvious bleeding at present. -c/w other home meds/formulary subs  as tolerated. -FEN/GI: Cardiac diabetic diet. -DVT PPx: Xarelto. -Code status: Full code. -Disposition: Admission, > 2 midnights.   All the records are reviewed and case discussed with ED provider. Management plans discussed with the patient, family and they are in agreement.  CODE STATUS: Full code.  TOTAL TIME TAKING CARE OF THIS PATIENT: 75 minutes.    Arta Silence M.D on 09/03/2018 at 8:10 PM  Between 7am to 6pm - Pager - [phone]  After 6pm go to [email or web address] - Proofreader  [facility]  Office  [phone]  CC: Primary care physician; [facility]   Note: This dictation was prepared with Dragon dictation along with smaller phrase technology. Any transcriptional errors that result from this process are unintentional.

## 2018-09-04 LAB — BASIC METABOLIC PANEL
Anion gap: 7 (ref 5–15)
BUN: 25 mg/dL — ABNORMAL HIGH (ref 8–23)
CO2: 30 mmol/L (ref 22–32)
Calcium: 8.4 mg/dL — ABNORMAL LOW (ref 8.9–10.3)
Chloride: 105 mmol/L (ref 98–111)
Creatinine, Ser: 0.98 mg/dL (ref 0.44–1.00)
GFR calc Af Amer: 58 mL/min — ABNORMAL LOW (ref >60)
GFR calc non Af Amer: 50 mL/min — ABNORMAL LOW (ref >60)
Glucose, Bld: 112 mg/dL — ABNORMAL HIGH (ref 70–99)
POTASSIUM: 3.8 mmol/L (ref 3.5–5.1)
Sodium: 142 mmol/L (ref 135–145)

## 2018-09-04 LAB — CBC
HCT: 27.3 % — ABNORMAL LOW (ref 36.0–46.0)
Hemoglobin: 8.4 g/dL — ABNORMAL LOW (ref 12.0–15.0)
MCH: 28.4 pg (ref 26.0–34.0)
MCHC: 30.8 g/dL (ref 30.0–36.0)
MCV: 92.2 fL (ref 80.0–100.0)
Platelets: 234 10*3/uL (ref 150–400)
RBC: 2.96 MIL/uL — AB (ref 3.87–5.11)
RDW: 17.5 % — ABNORMAL HIGH (ref 11.5–15.5)
WBC: 8.7 10*3/uL (ref 4.0–10.5)
nRBC: 0 % (ref 0.0–0.2)

## 2018-09-04 LAB — GLUCOSE, CAPILLARY
GLUCOSE-CAPILLARY: 183 mg/dL — AB (ref 70–99)
Glucose-Capillary: 77 mg/dL (ref 70–99)
Glucose-Capillary: 176 mg/dL — ABNORMAL HIGH (ref 70–99)
Glucose-Capillary: 202 mg/dL — ABNORMAL HIGH (ref 70–99)

## 2018-09-04 MED ORDER — CLINDAMYCIN HCL 150 MG PO CAPS
600.0000 mg | ORAL_CAPSULE | Freq: Three times a day (TID) | ORAL | Status: DC
  Administered 2018-09-04 – 2018-09-05 (×3): 600 mg via ORAL
  Filled 2018-09-04 (×6): qty 4

## 2018-09-04 NOTE — Consult Note (Signed)
[facility] Nurse wound consult note Reason for Consult: Nonhealing suture line to left anterior lower leg.  Trauma with sutures and nonapproximated wound edges.  Spoke with Dr Posey Pronto who agrees that sutures are not aiding in healing and are due to be removed.  I have removed the sutures and will implement topical wound therapy to promote healing.  Wound type:trauma Pressure Injury POA: NA Measurement: 11 cm x 4 cm x 0.2 cm nonapproximated edges, distal portion larger than proximal Wound TWY:SORTQ red, nonhealing Drainage (amount, consistency, odor) minimal clotted blood and serosanguinous drainage Periwound: Fragile, thin skin Dressing procedure/placement/frequency:Cleanse left lower leg with NS and pat dry.  Apply Xeroform to wound bed.  Cover with gauze and kerlix/tape.  Change daily. Will not follow at this time.  Please re-consult if needed.  Domenic Moras MSN, RN, FNP-BC [facility] Wound, Ostomy, Continence Nurse Pager [phone]

## 2018-09-04 NOTE — TOC Initial Note (Signed)
Transition of Care [facility]) - Initial/Assessment Note    Patient Details  [name] MRN: [id number] Date of Birth: [birth year]  Transition of Care [facility]) CM/SW Contact:    [name], LCSW Phone Number: 09/04/2018, 4:20 PM  Clinical Narrative:  Patient is a 85 year old female who is a resident at [facility].  Patient lives with her husband and is receiving home health services currently.  Patient's daughter stated that she would prefer that patient return back to ALF and not go to a SNF.  Patient's daughter feels that patient would be more motivated to work at home instead of going to SNF, also patient would have less of a chance to be exposed to other illnesses.  ALF feels that patient needs to go to SNF again, CSW updated attending physician who also spoke with ALF and would like her to return with home health.  Patient's daughter was explained role of CSW and process for having patient return back to ALF.  Patient's daughter did not have any other questions or concerns.             Expected Discharge Plan: Assisted Living Barriers to Discharge: Continued Medical Work up   Patient Goals and CMS Choice Patient states their goals for this hospitalization and ongoing recovery are:: To return back to ALF with home health to continue. CMS Medicare.gov Compare Post Acute Care list provided to:: Patient Represenative (must comment)(Patient's daughter ) Choice offered to / list presented to : Adult Children  Expected Discharge Plan and Services Expected Discharge Plan: Assisted Living   Post Acute Care Choice: Home Health, Resumption of Svcs/PTA Provider Living arrangements for the past 2 months: Assisted Living Facility                     HH Arranged: PT, Nurse's Aide, Social Work    Prior Living Arrangements/Services Living arrangements for the past 2 months: [place] Lives with:: Facility Resident, Spouse Patient language and need for  interpreter reviewed:: No Do you feel safe going back to the place where you live?: Yes      Need for Family Participation in Patient Care: Yes (Comment) Care giver support system in place?: Yes (comment)(Patient lives at ALF and has staff and her husband at facility.) Current home services: Home PT, Home RN Criminal Activity/Legal Involvement Pertinent to Current Situation/Hospitalization: No - Comment as needed  Activities of Daily Living Home Assistive Devices/Equipment: Environmental consultant (specify type), Oxygen ADL Screening (condition at time of admission) Patient's cognitive ability adequate to safely complete daily activities?: Yes Is the patient deaf or have difficulty hearing?: No Does the patient have difficulty seeing, even when wearing glasses/contacts?: No Does the patient have difficulty concentrating, remembering, or making decisions?: Yes Patient able to express need for assistance with ADLs?: Yes Does the patient have difficulty dressing or bathing?: No Independently performs ADLs?: No Does the patient have difficulty walking or climbing stairs?: Yes Weakness of Legs: Both Weakness of Arms/Hands: Left  Permission Sought/Granted Permission sought to share information with : Facility Sport and exercise psychologist, Family Supports Permission granted to share information with : Yes, Verbal Permission Granted  Share Information with NAME: [name],Victoria Daughter [phone]  [phone] or Xayla, Puzio [phone]  [phone]   Permission granted to share info w AGENCY: ALF and SNF admissions        Emotional Assessment Appearance:: Appears stated age   Affect (typically observed): Accepting, Appropriate, Calm, Stable Orientation: : Oriented to Self,  Oriented to  Time, Oriented to Situation, Oriented to Place Alcohol / Substance Use: Not Applicable Psych Involvement: No (comment)  Admission diagnosis:  [facility] - [facility] Patient Active Problem List   Diagnosis Date Noted   . Dyspnea 07/23/2018  . Weakness generalized 07/23/2018  . Goals of care, counseling/discussion   . Palliative care encounter   . Influenza A 07/05/2018  . CHF exacerbation ([place]) 06/14/2018  . Cellulitis of left lower extremity 06/02/2018  . ARF (acute renal failure) ([place]) 05/17/2018  . Pneumonia 12/11/2017  . CAP (community acquired pneumonia) 04/14/2017  . COPD (chronic obstructive pulmonary disease) ([place]) 12/07/2016  . UTI (urinary tract infection) 12/07/2016  . Ventral hernia without obstruction or gangrene   . Hyponatremia 09/07/2015  . Acute respiratory failure ([place]) 09/01/2015  . Atrial fibrillation ([place]) 04/10/2015  . Essential hypertension 04/10/2015  . Diabetes ([place]) 04/10/2015  . Acute on chronic combined systolic and diastolic CHF (congestive heart failure) ([place]) 03/21/2015  . Pulmonary nodule 11/10/2014   PCP:  [facility]:   [place] [place] [facility] [place] [address] Phone: [phone] Fax: [phone]  [id number] [place], [place] [address] Phone: [phone] Fax: [phone]     Social Determinants of Health (SDOH) Interventions    Readmission Risk Interventions 30 Day Unplanned Readmission Risk Score     ED to Hosp-Admission (Current) from 09/02/2018 in [place] (1A)  30 Day Unplanned Readmission Risk Score (%)  50 Filed at 09/04/2018 1600     This score is the patient's risk of an unplanned readmission within 30 days of being discharged (0 -100%). The score is based on dignosis, age, lab data, medications, orders, and past utilization.   Low:  0-14.9   Medium: 15-21.9   High: 22-29.9   Extreme: 30 and above       No flowsheet data found.

## 2018-09-04 NOTE — Plan of Care (Signed)

## 2018-09-04 NOTE — NC FL2 (Addendum)
[place] LEVEL OF CARE SCREENING TOOL     IDENTIFICATION  Patient [name] Birthdate: 01/21/1925 Sex: female Admission Date (Current Location): 09/02/2018  [place] and [place] Number:  Engineering geologist and Address:  [facility], [address] [place], [place] [address]      Provider Number: [id number]  Attending Physician Name and Address:  [name], MD  Relative Name and Phone Number:  [name],Victoria Daughter [phone]  [phone] or Manasa, Spease [phone]  [phone]     Current Level of Care: Hospital Recommended Level of Care: [place] Prior Approval Number:    Date Approved/Denied:   PASRR Number:    Discharge Plan: Domiciliary (Rest home)([facility])    Current Diagnoses: Patient Active Problem List   Diagnosis Date Noted  . Dyspnea 07/23/2018  . Weakness generalized 07/23/2018  . Goals of care, counseling/discussion   . Palliative care encounter   . Influenza A 07/05/2018  . CHF exacerbation ([place]) 06/14/2018  . Cellulitis of left lower extremity 06/02/2018  . ARF (acute renal failure) ([place]) 05/17/2018  . Pneumonia 12/11/2017  . CAP (community acquired pneumonia) 04/14/2017  . COPD (chronic obstructive pulmonary disease) ([place]) 12/07/2016  . UTI (urinary tract infection) 12/07/2016  . Ventral hernia without obstruction or gangrene   . Hyponatremia 09/07/2015  . Acute respiratory failure ([place]) 09/01/2015  . Atrial fibrillation ([place]) 04/10/2015  . Essential hypertension 04/10/2015  . Diabetes ([place]) 04/10/2015  . Acute on chronic combined systolic and diastolic CHF (congestive heart failure) ([place]) 03/21/2015  . Pulmonary nodule 11/10/2014    Orientation RESPIRATION BLADDER Height & Weight     Self, Time, Situation, Place  O2(2L) Incontinent Weight: 169 lb 12.1 oz (77 kg) Height:  5\' 7"  (170.2 cm)  BEHAVIORAL SYMPTOMS/MOOD NEUROLOGICAL BOWEL NUTRITION  STATUS      Incontinent Diet(Heart Healthy)  AMBULATORY STATUS COMMUNICATION OF NEEDS Skin   Limited Assist Verbally Surgical wounds                       Personal Care Assistance Level of Assistance  Bathing, Feeding, Dressing Bathing Assistance: Limited assistance Feeding assistance: Independent Dressing Assistance: Limited assistance     Functional Limitations Info  Sight, Hearing, Speech Sight Info: Adequate Hearing Info: Adequate Speech Info: Adequate    SPECIAL CARE FACTORS FREQUENCY  PT (By licensed PT)     PT Frequency: 5x a week with home health              Contractures Contractures Info: Not present    Additional Factors Info  Code Status, Allergies, Psychotropic, Insulin Sliding Scale Code Status Info: DNR Allergies Info: CEFTRIAXONE, CEFUROXIME, CEPHALOSPORINS, CODEINE, EPINEPHRINE, MORPHINE AND RELATED, BUPRENORPHINE HCL, CIPROFLOXACIN, LATEX, LIDOCAINE, PROCAINE Psychotropic Info: buPROPion (WELLBUTRIN XL) 24 hr tablet 300 mg and busPIRone (BUSPAR) tablet 5 mg and escitalopram (LEXAPRO) tablet 10 mg  Insulin Sliding Scale Info: insulin aspart (novoLOG) injection 0-15 Units 3x a day with meals       Current Medications (09/04/2018):  This is the current hospital active medication list Current Facility-Administered Medications  Medication Dose Route Frequency Provider Last Rate Last Dose  . acetaminophen (TYLENOL) tablet 650 mg  650 mg Oral Q6H PRN Arta Silence, MD   650 mg at 09/04/18 0015   Or  . acetaminophen (TYLENOL) suppository 650 mg  650 mg Rectal Q6H PRN Arta Silence, MD      . aspirin EC tablet 81 mg  81  mg Oral Daily Arta Silence, MD   81 mg at 09/04/18 0851  . baclofen (LIORESAL) tablet 10 mg  10 mg Oral TID PRN Arta Silence, MD   10 mg at 09/04/18 0848  . bisacodyl (DULCOLAX) EC tablet 5 mg  5 mg Oral Daily PRN Arta Silence, MD      . buPROPion (WELLBUTRIN XL) 24 hr tablet 300 mg  300 mg Oral Daily  Arta Silence, MD   300 mg at 09/04/18 0849  . busPIRone (BUSPAR) tablet 5 mg  5 mg Oral BID Arta Silence, MD   5 mg at 09/04/18 0849  . carvedilol (COREG) tablet 6.25 mg  6.25 mg Oral BID WC Arta Silence, MD   6.25 mg at 09/04/18 0850  . cholecalciferol (VITAMIN D3) tablet 2,000 Units  2,000 Units Oral Daily Arta Silence, MD   2,000 Units at 09/04/18 [phone]  . clindamycin (CLEOCIN) capsule 600 mg  600 mg Oral Q8H [name], MD   600 mg at 09/04/18 1300  . escitalopram (LEXAPRO) tablet 10 mg  10 mg Oral Daily Arta Silence, MD   10 mg at 09/04/18 0848  . ferrous sulfate tablet 325 mg  325 mg Oral Q breakfast Arta Silence, MD   325 mg at 09/04/18 0852  . furosemide (LASIX) tablet 40 mg  40 mg Oral BID Arta Silence, MD   40 mg at 09/04/18 0851  . gabapentin (NEURONTIN) capsule 200 mg  200 mg Oral QHS Arta Silence, MD   200 mg at 09/03/18 2115  . insulin aspart (novoLOG) injection 0-15 Units  0-15 Units Subcutaneous TID WC Arta Silence, MD   3 Units at 09/04/18 1300  . insulin aspart (novoLOG) injection 0-5 Units  0-5 Units Subcutaneous QHS Arta Silence, MD   3 Units at 09/03/18 2116  . insulin glargine (LANTUS) injection 20 Units  20 Units Subcutaneous QHS Arta Silence, MD   20 Units at 09/03/18 2116  . ipratropium-albuterol (DUONEB) 0.5-2.5 (3) MG/3ML nebulizer solution 3 mL  3 mL Nebulization Q6H PRN Arta Silence, MD      . magnesium oxide (MAG-OX) tablet 400 mg  400 mg Oral Daily Arta Silence, MD   400 mg at 09/04/18 0851  . Melatonin TABS 5 mg  5 mg Oral QHS Arta Silence, MD      . multivitamin with minerals tablet 1 tablet  1 tablet Oral Daily Arta Silence, MD   1 tablet at 09/04/18 0851  . ondansetron (ZOFRAN) injection 4 mg  4 mg Intravenous Once Gregor Hams, MD   Stopped at 09/03/18 0028  . ondansetron (ZOFRAN) tablet 4 mg  4 mg Oral Q6H PRN Arta Silence, MD       Or  .  ondansetron (ZOFRAN) injection 4 mg  4 mg Intravenous Q6H PRN Arta Silence, MD      . pantoprazole (PROTONIX) EC tablet 40 mg  40 mg Oral Daily Arta Silence, MD   40 mg at 09/04/18 0851  . polyethylene glycol (MIRALAX / GLYCOLAX) packet 17 g  17 g Oral Daily Arta Silence, MD   17 g at 09/04/18 0850  . Rivaroxaban (XARELTO) tablet 15 mg  15 mg Oral Q supper Arta Silence, MD   15 mg at 09/03/18 1702  . rOPINIRole (REQUIP) tablet 0.5 mg  0.5 mg Oral QHS Arta Silence, MD   0.5 mg at 09/03/18 2116  . senna-docusate (Senokot-S) tablet 1 tablet  1 tablet Oral QHS PRN Arta Silence, MD      .  spironolactone (ALDACTONE) tablet 12.5 mg  12.5 mg Oral Daily Arta Silence, MD   12.5 mg at 09/04/18 0850  . traZODone (DESYREL) tablet 25 mg  25 mg Oral QHS Arta Silence, MD   25 mg at 09/03/18 2116  . vitamin B-12 (CYANOCOBALAMIN) tablet 1,000 mcg  1,000 mcg Oral Daily Arta Silence, MD   1,000 mcg at 09/04/18 1308     Discharge Medications: STOP taking these medications   amoxicillin 875 MG tablet Commonly known as:  AMOXIL     TAKE these medications   acetaminophen 325 MG tablet Commonly known as:  TYLENOL Take 2 tablets (650 mg total) by mouth every 6 (six) hours as needed for mild pain (or Fever >/= 101).   aspirin EC 81 MG tablet Take 81 mg by mouth daily.   baclofen 10 MG tablet Commonly known as:  LIORESAL Take 10 mg by mouth 3 (three) times daily as needed (pain).   buPROPion 300 MG 24 hr tablet Commonly known as:  WELLBUTRIN XL Take 300 mg by mouth daily.   busPIRone 5 MG tablet Commonly known as:  BUSPAR Take 5 mg by mouth 2 (two) times daily.   carvedilol 6.25 MG tablet Commonly known as:  COREG Take 6.25 mg by mouth 2 (two) times daily with a meal.   cholecalciferol 1000 units tablet Commonly known as:  VITAMIN D Take 2,000 Units by mouth daily.   docusate sodium 100 MG capsule Commonly known as:   COLACE Take 100 mg by mouth 2 (two) times daily.   doxycycline 100 MG tablet Commonly known as:  VIBRA-TABS Take 1 tablet (100 mg total) by mouth every 12 (twelve) hours.   escitalopram 10 MG tablet Commonly known as:  LEXAPRO Take 10 mg by mouth daily.   ferrous sulfate 325 (65 FE) MG tablet Take 325 mg by mouth daily with breakfast.   fexofenadine 60 MG tablet Commonly known as:  ALLEGRA Take 60 mg by mouth 2 (two) times daily.   furosemide 40 MG tablet Commonly known as:  Lasix Take 1 tablet (40 mg total) by mouth 2 (two) times daily.   gabapentin 100 MG capsule Commonly known as:  NEURONTIN Take 100-200 mg by mouth See admin instructions. Take 1 capsule (100mg ) by mouth every night at bedtime and 1 additional capsule by mouth at bedtime as needed for leg pain   insulin glargine 100 UNIT/ML injection Commonly known as:  LANTUS Inject 0.2 mLs (20 Units total) into the skin at bedtime. What changed:  how much to take   ipratropium-albuterol 0.5-2.5 (3) MG/3ML Soln Commonly known as:  DUONEB Take 3 mLs by nebulization every 6 (six) hours as needed (wheezing). What changed:    when to take this  reasons to take this   magnesium oxide 400 MG tablet Commonly known as:  MAG-OX Take 400 mg by mouth daily.   Melatonin 3 MG Tabs Take 3 mg by mouth at bedtime.   metFORMIN 750 MG 24 hr tablet Commonly known as:  GLUCOPHAGE-XR Take 750 mg by mouth daily.   metoCLOPramide 5 MG tablet Commonly known as:  REGLAN Take 5 mg by mouth every 6 (six) hours as needed for nausea or vomiting.   multivitamin tablet Take 1 tablet by mouth daily.   oxyCODONE 5 MG immediate release tablet Commonly known as:  Oxy IR/ROXICODONE Take 1 tablet (5 mg total) by mouth every 6 (six) hours as needed for moderate pain or severe pain. What changed:    how  much to take  Another medication with the same name was removed. Continue taking this medication, and follow the  directions you see here.   pantoprazole 40 MG tablet Commonly known as:  PROTONIX Take 40 mg by mouth daily.   polyethylene glycol packet Commonly known as:  MIRALAX / GLYCOLAX Take 17 g by mouth daily.   ProAir HFA 108 (90 Base) MCG/ACT inhaler Generic drug:  albuterol Inhale 2 puffs into the lungs every 6 (six) hours as needed for wheezing or shortness of breath.   Rivaroxaban 15 MG Tabs tablet Commonly known as:  XARELTO Take 15 mg by mouth daily.   rOPINIRole 0.5 MG tablet Commonly known as:  REQUIP Take 0.5 mg by mouth at bedtime.   senna 8.6 MG Tabs tablet Commonly known as:  SENOKOT Take 1 tablet (8.6 mg total) by mouth daily.   sodium phosphate 7-19 GM/118ML Enem Place 1 enema rectally daily as needed for severe constipation.   spironolactone 25 MG tablet Commonly known as:  ALDACTONE Take 0.5 tablets (12.5 mg total) by mouth daily.   traZODone 50 MG tablet Commonly known as:  DESYREL Take 25 mg by mouth at bedtime.   vitamin B-12 1000 MCG tablet Commonly known as:  CYANOCOBALAMIN Take 1,000 mcg by mouth daily.   vitamin C 500 MG tablet Commonly known as:  ASCORBIC ACID Take 500 mg by mouth daily.   Vitamin D (Ergocalciferol) 1.25 MG (50000 UT) Caps capsule Commonly known as:  DRISDOL Take 50,000 Units by mouth every Thursday.                                                                                  Relevant Imaging Results:  Relevant Lab Results:   Additional Information SSN [id number]  [name], LCSW

## 2018-09-04 NOTE — Progress Notes (Signed)
[place] [facility] [place] [name]    MR#:  [id number]  DATE OF BIRTH:  10-14-1924  SUBJECTIVE:   Denies any complaints. According to the RN has been little sleepy. Got Tylenol for pain. No fever. REVIEW OF SYSTEMS:   Review of Systems  Constitutional: Negative for chills, fever and weight loss.  HENT: Negative for ear discharge, ear pain and nosebleeds.   Eyes: Negative for blurred vision, pain and discharge.  Respiratory: Negative for sputum production, shortness of breath, wheezing and stridor.   Cardiovascular: Positive for leg swelling. Negative for chest pain, palpitations, orthopnea and PND.  Gastrointestinal: Negative for abdominal pain, diarrhea, nausea and vomiting.  Genitourinary: Negative for frequency and urgency.  Musculoskeletal: Negative for back pain and joint pain.  Neurological: Negative for sensory change, speech change, focal weakness and weakness.  Psychiatric/Behavioral: Negative for depression and hallucinations. The patient is not nervous/anxious.    Tolerating Diet: yes Tolerating PT: pending  DRUG ALLERGIES:   Allergies  Allergen Reactions  . Ceftriaxone Swelling    Tolerates piperacillin/tazobactam, PCN G  . Cefuroxime Hives    Tolerates piperacillin/tazobactam, PCN G  . Cephalosporins Swelling and Other (See Comments)    Reaction:  Fainting/dry mouth  Tolerates piperacillin/tazobactam, PCN G  . Codeine Nausea And Vomiting  . Epinephrine Other (See Comments)    Reaction:  Fainting   . Morphine And Related Other (See Comments)    Reaction:  GI upset   . Buprenorphine Hcl Nausea And Vomiting  . Ciprofloxacin Itching, Swelling and Rash  . Latex Rash  . Lidocaine Rash  . Procaine Rash    VITALS:  Blood pressure (!) 156/60, pulse 71, temperature 97.7 F (36.5 C), resp. rate 17, height 5\' 7"  (1.702 m), weight 77 kg, SpO2 98 %.  PHYSICAL EXAMINATION:   Physical Exam  GENERAL:  85 y.o.-year-old patient lying in the bed with no acute distress.  EYES: Pupils equal, round, reactive to light and accommodation. No scleral icterus. Extraocular muscles intact.  HEENT: Head atraumatic, normocephalic. Oropharynx and nasopharynx clear.  NECK:  Supple, no jugular venous distention. No thyroid enlargement, no tenderness.  LUNGS: Normal breath sounds bilaterally, no wheezing, rales, rhonchi. No use of accessory muscles of respiration.  CARDIOVASCULAR: S1, S2 normal. No murmurs, rubs, or gallops.  ABDOMEN: Soft, nontender, nondistended. Bowel sounds present. No organomegaly or mass.  EXTREMITIES: bilateral lower extremity chronic venous stasis. Cellulitis resolved in the left tibial shin. There is a laceration with clotted present along with sutures appears to wound gaping secondary to edema. NEUROLOGIC: Cranial nerves II through XII are intact. No focal Motor or sensory deficits b/l.   PSYCHIATRIC:  patient is alert and oriented x 3.  SKIN: No obvious rash, lesion, or ulcer.   LABORATORY PANEL:  CBC Recent Labs  Lab 09/04/18 0226  WBC 8.7  HGB 8.4*  HCT 27.3*  PLT 234    Chemistries  Recent Labs  Lab 09/03/18 0019 09/04/18 0226  NA 141 142  K 3.9 3.8  CL 103 105  CO2 28 30  GLUCOSE 255* 112*  BUN 34* 25*  CREATININE 1.07* 0.98  CALCIUM 8.9 8.4*  AST 16  --   ALT 11  --   ALKPHOS 81  --   BILITOT 0.7  --    Cardiac Enzymes No results for input(s): TROPONINI in the last 168 hours. RADIOLOGY:  Dg Tibia/fibula Left  Result Date: 09/03/2018 CLINICAL DATA:  Left shoulder pain  and left leg pain after a fall 1 week ago. EXAM: LEFT TIBIA AND FIBULA - 2 VIEW COMPARISON:  None. FINDINGS: Diffuse bone demineralization. Degenerative changes in the left knee. Linear sclerosis and irregularity along the lateral tibial plateau suggesting a probable depressed lateral tibial plateau fracture. Remainder of the left tibia and fibula appear intact. Vascular calcifications in the  soft tissues. IMPRESSION: Probable depressed lateral tibial plateau fracture. Degenerative changes in the left knee. Electronically Signed   By: Lucienne Capers M.D.   On: 09/03/2018 00:28   Ct Cervical Spine Wo Contrast  Result Date: 09/03/2018 CLINICAL DATA:  Shoulder and leg pain from fall a week ago. Increasing pain in the left shoulder. EXAM: CT CERVICAL SPINE WITHOUT CONTRAST TECHNIQUE: Multidetector CT imaging of the cervical spine was performed without intravenous contrast. Multiplanar CT image reconstructions were also generated. COMPARISON:  01/10/2018 FINDINGS: Alignment: Maintained cervical lordosis. Skull base and vertebrae: Intact skull base. Osteoarthritis of the atlantodental interval. Intact cervical vertebral bodies without fracture or suspicious osseous lesions. Soft tissues and spinal canal: No prevertebral soft tissue swelling. No visible canal hematoma. Carotid and vertebral arterial calcifications are again noted. Disc levels: Degenerative disc disease with vacuum disc phenomenon and endplate spurring is identified at C5-6. Mild bilateral foraminal encroachment at C5-6. Osteoarthritis of the facets at C3-4 and C4-5, right greater than left. Upper chest: Respiratory motion artifacts. Ground-glass mosaic appearance of the right upper lobe. Other: None IMPRESSION: 1. Cervical spondylosis without acute cervical spine fracture or static listhesis. 2. Mosaic attenuation of the included right upper lobe, nonspecific may represent stigmata of small airway disease, alveolitis/ pneumonitis or edema among some considerations. Electronically Signed   By: Ashley Royalty M.D.   On: 09/03/2018 00:24   Dg Chest Portable 1 View  Result Date: 09/03/2018 CLINICAL DATA:  Increasing left shoulder,. EXAM: PORTABLE CHEST 1 VIEW COMPARISON:  07/05/2018 FINDINGS: Cardiomegaly with moderate aortic atherosclerosis. Mild interstitial edema slightly decreased in appearance from prior. Osteoarthritis of the included  acromioclavicular and glenohumeral joints. No displaced fracture is seen. Left-sided pacemaker apparatus with leads in the right atrium and right ventricle are identified. Lower thoracic and lumbar fusion hardware is noted. IMPRESSION: 1. Cardiomegaly with interstitial edema slightly decreased in appearance. 2. Aortic atherosclerosis without aneurysm. 3. 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 Electronically Signed   By: Ashley Royalty M.D.   On: 09/03/2018 00:35   Dg Shoulder Left  Result Date: 09/03/2018 CLINICAL DATA:  Left shoulder pain after fall 1 week ago. EXAM: LEFT SHOULDER - 2+ VIEW COMPARISON:  12/12/2017 FINDINGS: Subtle lucency along the undersurface of the distal clavicle is identified, nonspecific but possibility of a nondisplaced fracture is raised. Undersurface spurring with joint space narrowing of the acromioclavicular joint consistent with AC joint osteoarthritis. The glenohumeral joint is intact. Left-sided pacemaker apparatus projects over the axilla limiting assessment in this region. Aortic atherosclerosis without aneurysm is noted. Mild pulmonary vascular congestion is seen. Soft tissues are unremarkable. IMPRESSION: Nondisplaced linear lucency along the undersurface of the distal clavicle is identified, nonspecific but a subtle incomplete fracture is not excluded. A similar finding is not identified on prior comparisons. Vascular channel or cortical groove are among differential possibilities. Osteoarthritis of the acromioclavicular joint. Electronically Signed   By: Ashley Royalty M.D.   On: 09/03/2018 00:29   ASSESSMENT AND PLAN:  85 year old admitted with lower extremity cellulitis and pain in her ankle and clavicle.  1.  Left ankle cellulitis appears to have improved continue current antibiotics likely  switch to oral clindamycin -wound care nurse to see if sutures can be removed. Wound gaping noted due to chronic edema in both lower extremities. -Blood cultures  negative -no fever -white count normal  2.  Sprain of the left shoulder with possible occult fracture supportive care no sling recommended per Ortho-- Dr. Sabra Heck  3.  Sprain of the left knee with no sign of fracture ambulated with walker using weightbearing as tolerated on the left per Ortho -physical therapy to see patient  4.  History of congestive heart heart failure continue Lasix  5.  Diabetes type 2 continue sliding-scale insulin and Lantus, continue metformin  6.  History of COPD without exacerbation continue nebulizers as needed  7.  History of A. fib continue Xarelto as doing at home   After wound consult and physical therapy will consider discharge patient back to [facility] assisted living with home health PT. Social worker for discharge planning   CODE STATUS: DNR  DVT Prophylaxis: xarelto  TOTAL TIME TAKING CARE OF THIS PATIENT: *30* minutes.  >50% time spent on counselling and coordination of care  POSSIBLE D/C IN 1 DAYS, DEPENDING ON CLINICAL CONDITION.  Note: This dictation was prepared with Dragon dictation along with smaller phrase technology. Any transcriptional errors that result from this process are unintentional.  [name] M.D on 09/04/2018 at 7:40 AM  Between 7am to 6pm - Pager - [phone]  After 6pm go to [email or web address] - password [email or web address]  Office  [phone]  CC: Primary care physician; Housecalls, Doctors MakingPatient ID: [name], female   DOB: Mar 28, 1925, 85 y.o.   MRN: [id number]

## 2018-09-04 NOTE — Clinical Social Work Note (Addendum)
9:25am  CSW contacted [facility] ALF and informed them that patient is ready for discharge today.  ALF said they would call CSW back after their morning meeting in about 20 minutes.   10:50 am CSW has not heard back from ALF, CSW contacted ALF, and spoke to [place] DON to inform her that patient is ready for discharge and PT is recommending home health.  She said she needs to assess patient before she can return.  CSW asked what time she will be here, [name] at [facility] stated she has 4 new admissions and does not know when she will be able to see patient, but she will call back and let CSW know.  12:15pm  CSW attempted to call [facility] back to find out when [name] will be at hospital to assess patient, left a message for her to call back.  2:09pm  CSW called [facility] back again, CSW has not heard from [facility] about when she will be here, [name] DON said she will talk to executive director (ED) and discuss when she can assess patient.  She states she will call CSW back.  3:11pm CSW received phone call from [name] who stated she has two new admissions and has not been able to assess patient and asked if patient can come on Monday.  CSW informed her that patient has been seen by PT and they are recommending home health PT and physician does not feel there is a medical reason to keep her.   ALF said they would like her to go to SNF first before she returns back to ALF.  CSW faxed updated clinical notes to ALF.  They are reviewing patient's clinical information.  CSW contacted patient's daughter [name] [phone] who is an occupational therapist, she stated patient has been doing well with home health and is minimum assistance with transfers, and patient normally has oxygen at night.  Patient's daughter stated that patient lives with her husband at ALF and she usually transfers herself from bed to wheelchair, and to toilet.  Patient's daughter stated that patient just discharged from  SNF, and she would rather not have her return to SNF because she may be exposed to more germs and she would be more motivated at ALF.  Patient's daughter also said that patient was able to transfer to toilet today.  CSW updated patient's daughter that ALF wants patient to go to SNF.  3:50pm  CSW contacted ALF again to discuss patient discharging today, [name] stated she has to speak with her executive director to make decision about having patient return to ALF and she will call CSW back.  4:10pm  CSW received phone call from physician asking for update on patient's disposition, she would like patient to return back to ALF with home health,she feels patient will be at more risk for getting sick at [facility].  Physician offered to speak to [name] at [place], [place] provided contact information to physician.  4:15pm  CSW attempted to call [facility] back, ALF said not to call back, then CSW informed [name] that physician would like to speak to her, CSW asked physician to call [name] DON.  4:30 CSW received phone call from physician who spoke with [name], and [name] is speaking to ED about patient returning, physician said ED will call CSW back, awaiting for call back.  5:20pm  CSW attempted to call [facility], left a message on ED voice mail, and asked for a call back.  CSW awaiting for  call back.  5:45pm  CSW called patient's daughter [name] [phone] and updated her that CSW was waiting for call back from ALF to have her return.  CSW informed her that the weekend social worker will try to follow up with [facility] over the weekend.  Jones Broom. [place], MSW, [place]

## 2018-09-04 NOTE — Evaluation (Signed)
Physical Therapy Evaluation Patient Details [name] MRN: [id number] DOB: 1924-07-22 Today's Date: 09/04/2018   History of Present Illness  From MD H&P: Pt is a 85 year old admitted with lower extremity cellulitis and pain in her ankle and clavicle.  Assessment includes: Left ankle cellulitis, sprain of the left shoulder with possible occult fracture supportive care no sling recommended per Ortho, sprain of the left knee with no sign of fracture per ortho, CHF, DM II, COPD, and A-fib.      Clinical Impression  Pt presents with deficits in strength, transfers, mobility, gait, balance, and activity tolerance.  Pt required extra time and effort with bed mobility tasks but no physical assistance.  Pt did not require assistance to stand but upon standing presented with some posterior instability that she was able to self-correct, baseline per pt.  Pt was able to amb 5' at EOB but ultimately was limited by L shoulder pain with amb.  Pt found on 2LO2/min with SpO2 95-96% with nursing giving ok for trial amb on room air.  Pt's SpO2 dropped to 87% after amb with pt returned to 2LO2/min with SpO2 quickly improving to the low 90s, nursing notified.  Of note, pt was actively receiving HHPT services at her ALF prior to admission.  Pt will benefit from continued HHPT services upon discharge to safely address above deficits for decreased caregiver assistance and eventual return to PLOF.      Follow Up Recommendations Home health PT;Supervision for mobility/OOB(Pt actively receiving HHPT prior to admission)    Equipment Recommendations  None recommended by PT    Recommendations for Other Services       Precautions / Restrictions Precautions Precautions: Fall Restrictions Weight Bearing Restrictions: Yes LLE Weight Bearing: Weight bearing as tolerated      Mobility  Bed Mobility Overal bed mobility: Modified Independent             General bed mobility comments: Extra time and  effort but no physical assistance needed  Transfers Overall transfer level: Needs assistance Equipment used: Rolling walker (2 wheeled) Transfers: Sit to/from Stand Sit to Stand: Min guard         General transfer comment: Good control and stability   Ambulation/Gait Ambulation/Gait assistance: Min guard Gait Distance (Feet): 5 Feet Assistive device: Rolling walker (2 wheeled) Gait Pattern/deviations: Step-through pattern;Decreased step length - right;Decreased step length - left Gait velocity: decreased   General Gait Details: Pt limited by LUE pain during amb; SpO2 87% after amb on room air, returned to 2LO2/min with SpO2 quickly increasing to >/= 92%, nursing notified.   Stairs            Wheelchair Mobility    Modified Rankin (Stroke Patients Only)       Balance Overall balance assessment: Needs assistance   Sitting balance-Leahy Scale: Normal     Standing balance support: Bilateral upper extremity supported Standing balance-Leahy Scale: Fair                               Pertinent Vitals/Pain Pain Assessment: 0-10 Pain Score: 8  Pain Location: LUE Pain Descriptors / Indicators: Aching;Sore Pain Intervention(s): Premedicated before session;Monitored during session    Home Living Family/patient expects to be discharged to:: Assisted living               Home Equipment: Walker - 2 wheels;Wheelchair - manual      Prior Function Level of Independence: Needs  assistance   Gait / Transfers Assistance Needed: Pt walks limited facility distances with a RW, uses a w/c to/from dining, no other falls other than recent fall from her w/c  ADL's / Homemaking Assistance Needed: ALF staff assists with all ADLs        Hand Dominance   Dominant Hand: Right    Extremity/Trunk Assessment   Upper Extremity Assessment Upper Extremity Assessment: Generalized weakness;LUE deficits/detail LUE: Unable to fully assess due to pain    Lower  Extremity Assessment Lower Extremity Assessment: Generalized weakness       Communication   Communication: HOH  Cognition Arousal/Alertness: Awake/alert Behavior During Therapy: WFL for tasks assessed/performed Overall Cognitive Status: Within Functional Limits for tasks assessed                                        General Comments      Exercises Total Joint Exercises Ankle Circles/Pumps: Strengthening;Both;10 reps Quad Sets: Strengthening;Both;10 reps Gluteal Sets: Strengthening;Both;10 reps Heel Slides: AROM;Both;10 reps Hip ABduction/ADduction: AROM;Both;10 reps Straight Leg Raises: AAROM;Both;10 reps Long Arc Quad: Strengthening;Both;10 reps Knee Flexion: Strengthening;Both;10 reps Other Exercises Other Exercises: HEP education for BLE APs, QS, GS, and LAQ x 10 each 5-6x/day   Assessment/Plan    PT Assessment Patient needs continued PT services  PT Problem List Decreased strength;Decreased activity tolerance;Decreased balance;Decreased mobility;Pain       PT Treatment Interventions DME instruction;Gait training;Functional mobility training;Therapeutic exercise;Therapeutic activities;Balance training;Patient/family education    PT Goals (Current goals can be found in the Care Plan section)  Acute Rehab PT Goals Patient Stated Goal: To walk better PT Goal Formulation: With patient Time For Goal Achievement: 09/17/18 Potential to Achieve Goals: Good    Frequency Min 2X/week   Barriers to discharge        Co-evaluation               AM-PAC PT "6 Clicks" Mobility  Outcome Measure Help needed turning from your back to your side while in a flat bed without using bedrails?: A Little Help needed moving from lying on your back to sitting on the side of a flat bed without using bedrails?: A Little Help needed moving to and from a bed to a chair (including a wheelchair)?: A Little Help needed standing up from a chair using your arms (e.g.,  wheelchair or bedside chair)?: A Little Help needed to walk in hospital room?: A Little Help needed climbing 3-5 steps with a railing? : A Lot 6 Click Score: 17    End of Session Equipment Utilized During Treatment: Gait belt;Oxygen Activity Tolerance: Patient limited by pain Patient left: in chair;with chair alarm set;with call bell/phone within reach Nurse Communication: Mobility status;Other (comment)(SpO2 to 87% with amb on room air) PT Visit Diagnosis: Muscle weakness (generalized) (M62.81);Difficulty in walking, not elsewhere classified (R26.2);Pain Pain - Right/Left: Left Pain - part of body: Shoulder    Time: 8921-1941 PT Time Calculation (min) (ACUTE ONLY): 27 min   Charges:   PT Evaluation $PT Eval Low Complexity: 1 Low PT Treatments $Therapeutic Exercise: 8-22 mins        D. Scott Shinichi Anguiano PT, DPT 09/04/18, 10:18 AM

## 2018-09-05 LAB — GLUCOSE, CAPILLARY
Glucose-Capillary: 110 mg/dL — ABNORMAL HIGH (ref 70–99)
Glucose-Capillary: 315 mg/dL — ABNORMAL HIGH (ref 70–99)
Glucose-Capillary: 192 mg/dL — ABNORMAL HIGH (ref 70–99)
Glucose-Capillary: 125 mg/dL — ABNORMAL HIGH (ref 70–99)
Glucose-Capillary: 144 mg/dL — ABNORMAL HIGH (ref 70–99)

## 2018-09-05 MED ORDER — ALUM & MAG HYDROXIDE-SIMETH 200-200-20 MG/5ML PO SUSP
30.0000 mL | ORAL | Status: DC | PRN
  Administered 2018-09-05 – 2018-09-06 (×2): 30 mL via ORAL
  Filled 2018-09-05 (×2): qty 30

## 2018-09-05 MED ORDER — DOXYCYCLINE HYCLATE 100 MG PO TABS
100.0000 mg | ORAL_TABLET | Freq: Two times a day (BID) | ORAL | Status: DC
  Administered 2018-09-05 – 2018-09-07 (×5): 100 mg via ORAL
  Filled 2018-09-05 (×5): qty 1

## 2018-09-05 NOTE — Progress Notes (Signed)
[name] at [place] [name]    MR#:  [id number]  DATE OF BIRTH:  [birth year]  SUBJECTIVE:   Denies any complaints. According to the RN has been little sleepy. Got Tylenol for pain. No fever. REVIEW OF SYSTEMS:   Review of Systems  Constitutional: Negative for chills, fever and weight loss.  HENT: Negative for ear discharge, ear pain and nosebleeds.   Eyes: Negative for blurred vision, pain and discharge.  Respiratory: Negative for sputum production, shortness of breath, wheezing and stridor.   Cardiovascular: Positive for leg swelling. Negative for chest pain, palpitations, orthopnea and PND.  Gastrointestinal: Negative for abdominal pain, diarrhea, nausea and vomiting.  Genitourinary: Negative for frequency and urgency.  Musculoskeletal: Negative for back pain and joint pain.  Neurological: Negative for sensory change, speech change, focal weakness and weakness.  Psychiatric/Behavioral: Negative for depression and hallucinations. The patient is not nervous/anxious.    Tolerating Diet: yes Tolerating PT: HHPT  DRUG ALLERGIES:   Allergies  Allergen Reactions  . Ceftriaxone Swelling    Tolerates piperacillin/tazobactam, PCN G  . Cefuroxime Hives    Tolerates piperacillin/tazobactam, PCN G  . Cephalosporins Swelling and Other (See Comments)    Reaction:  Fainting/dry mouth  Tolerates piperacillin/tazobactam, PCN G  . Codeine Nausea And Vomiting  . Epinephrine Other (See Comments)    Reaction:  Fainting   . Morphine And Related Other (See Comments)    Reaction:  GI upset   . Buprenorphine Hcl Nausea And Vomiting  . Ciprofloxacin Itching, Swelling and Rash  . Latex Rash  . Lidocaine Rash  . Procaine Rash    VITALS:  Blood pressure 139/77, pulse 78, temperature 97.7 F (36.5 C), temperature source Oral, resp. rate 17, height 5\' 7"  (1.702 m), weight 77 kg, SpO2 94 %.  PHYSICAL EXAMINATION:   Physical  Exam  GENERAL:  85 y.o.-year-old patient lying in the bed with no acute distress.  EYES: Pupils equal, round, reactive to light and accommodation. No scleral icterus. Extraocular muscles intact.  HEENT: Head atraumatic, normocephalic. Oropharynx and nasopharynx clear.  NECK:  Supple, no jugular venous distention. No thyroid enlargement, no tenderness.  LUNGS: Normal breath sounds bilaterally, no wheezing, rales, rhonchi. No use of accessory muscles of respiration.  CARDIOVASCULAR: S1, S2 normal. No murmurs, rubs, or gallops.  ABDOMEN: Soft, nontender, nondistended. Bowel sounds present. No organomegaly or mass.  EXTREMITIES: bilateral lower extremity chronic venous stasis. Cellulitis resolved in the left tibial shin. There is a laceration with clotted present along with sutures appears to wound gaping secondary to edema. NEUROLOGIC: Cranial nerves II through XII are intact. No focal Motor or sensory deficits b/l.   PSYCHIATRIC:  patient is alert and oriented x 3.  SKIN: No obvious rash, lesion, or ulcer.   LABORATORY PANEL:  CBC Recent Labs  Lab 09/04/18 0226  WBC 8.7  HGB 8.4*  HCT 27.3*  PLT 234    Chemistries  Recent Labs  Lab 09/03/18 0019 09/04/18 0226  NA 141 142  K 3.9 3.8  CL 103 105  CO2 28 30  GLUCOSE 255* 112*  BUN 34* 25*  CREATININE 1.07* 0.98  CALCIUM 8.9 8.4*  AST 16  --   ALT 11  --   ALKPHOS 81  --   BILITOT 0.7  --    Cardiac Enzymes No results for input(s): TROPONINI in the last 168 hours. RADIOLOGY:  No results found. ASSESSMENT AND PLAN:  85 year old admitted with lower  extremity cellulitis and pain in her ankle and clavicle.  1.  Left ankle cellulitis appears to have improved continue current antibiotics likely switch to oral clindamycin -wound care nurse to see if sutures can be removed. Wound gaping noted due to chronic edema in both lower extremities. -Blood cultures negative -no fever -white count normal  2.  Sprain of the left  shoulder with possible occult fracture supportive care no sling recommended per Ortho-- Dr. Sabra Hoffman  3.  Sprain of the left knee with no sign of fracture ambulated with walker using weightbearing as tolerated on the left per Ortho -physical therapy to see patient  4.  History of congestive heart heart failure continue Lasix  5.  Diabetes type 2 continue sliding-scale insulin and Lantus, continue metformin  6.  History of COPD without exacerbation continue nebulizers as needed  7.  History of A. fib continue Xarelto as doing at home   After wound consult and physical therapy will consider discharge patient back to [facility] assisted living with home health PT. Social worker for discharge planning. I had spoken at length with [name] the Mudlogger of nursing at [facility] to get back in touch with me after she had a chance to talk with her Development worker, international aid. I did not get a call yesterday. Patient is medically stable and ready for discharge. Family and patient wants to go back to [facility]. They do not want  any rehab since she just got out of rehab.   CODE STATUS: DNR  DVT Prophylaxis: xarelto  TOTAL TIME TAKING CARE OF THIS PATIENT: *30* minutes.  >50% time spent on counselling and coordination of care  POSSIBLE D/C IN 1 DAYS, DEPENDING ON CLINICAL CONDITION.  Note: This dictation was prepared with Dragon dictation along with smaller phrase technology. Any transcriptional errors that result from this process are unintentional.  [name] M.D on 09/05/2018 at 11:52 AM  Between 7am to 6pm - Pager - [phone]  After 6pm go to [email or web address] - password [email or web address] [facility]  Office  [phone]  CC: Primary care physician; Housecalls, Doctors MakingPatient ID: [name], female   DOB: 1924-12-12, 85 y.o.   MRN: [id number]

## 2018-09-05 NOTE — Progress Notes (Signed)
SATURATION QUALIFICATIONS: (This note is used to comply with regulatory documentation for home oxygen)  Patient Saturations on Room Air at Rest = 86%  Patient Saturations on 2 Liters of oxygen at rest = 97%

## 2018-09-05 NOTE — TOC Transition Note (Signed)
Transition of Care [facility]) - CM/SW Discharge Note   Patient Details  Name: Maybelline Kolarik MRN: [id number] Date of Birth: 10/22/1924  Transition of Care [facility]) CM/SW Contact:  Zettie Pho, LCSW Phone Number: 09/05/2018, 4:19 PM   Clinical Narrative:   The CSW attempted to contact [facility] to inquire about possible discharge this weekend. The CSW was not able to reach anyone at the facility. The CSW attempted to contact all extensions available. CSW will continue to follow.    Final next level of care: [place] Barriers to Discharge: Continued Medical Work up   Patient Goals and CMS Choice Patient states their goals for this hospitalization and ongoing recovery are:: To return back to ALF with home health to continue. CMS Medicare.gov Compare Post Acute Care list provided to:: Patient Represenative (must comment)(Patient's daughter ) Choice offered to / list presented to : Adult Children  Discharge Placement                       Discharge Plan and Services   Post Acute Care Choice: Home Health, Resumption of Svcs/PTA Provider              HH Arranged: PT, Nurse's Aide, Social Work     Social Determinants of Health (SDOH) Interventions     Readmission Risk Interventions Readmission Risk Prevention Plan 09/04/2018  Transportation Screening Complete  Medication Review Press photographer) Complete  HRI or [place] Complete  SW Recovery Care/Counseling Consult Complete  Palliative Care Screening Complete  [place] Not Complete  SNF Comments PT recommended home health, patient is from [place] [facility].  Some recent data might be hidden

## 2018-09-06 LAB — GLUCOSE, CAPILLARY
Glucose-Capillary: 192 mg/dL — ABNORMAL HIGH (ref 70–99)
Glucose-Capillary: 229 mg/dL — ABNORMAL HIGH (ref 70–99)
Glucose-Capillary: 248 mg/dL — ABNORMAL HIGH (ref 70–99)
Glucose-Capillary: 155 mg/dL — ABNORMAL HIGH (ref 70–99)
Glucose-Capillary: 265 mg/dL — ABNORMAL HIGH (ref 70–99)

## 2018-09-06 NOTE — Progress Notes (Signed)
[facility] at [place] [name]    MR#:  [id number]  DATE OF BIRTH:  [birth year]  SUBJECTIVE:   Denies any complaints. Wants to return back to ALF REVIEW OF SYSTEMS:   Review of Systems  Constitutional: Negative for chills, fever and weight loss.  HENT: Negative for ear discharge, ear pain and nosebleeds.   Eyes: Negative for blurred vision, pain and discharge.  Respiratory: Negative for sputum production, shortness of breath, wheezing and stridor.   Cardiovascular: Positive for leg swelling. Negative for chest pain, palpitations, orthopnea and PND.  Gastrointestinal: Negative for abdominal pain, diarrhea, nausea and vomiting.  Genitourinary: Negative for frequency and urgency.  Musculoskeletal: Negative for back pain and joint pain.  Neurological: Negative for sensory change, speech change, focal weakness and weakness.  Psychiatric/Behavioral: Negative for depression and hallucinations. The patient is not nervous/anxious.    Tolerating Diet: yes Tolerating PT: HHPT  DRUG ALLERGIES:   Allergies  Allergen Reactions  . Ceftriaxone Swelling    Tolerates piperacillin/tazobactam, PCN G  . Cefuroxime Hives    Tolerates piperacillin/tazobactam, PCN G  . Cephalosporins Swelling and Other (See Comments)    Reaction:  Fainting/dry mouth  Tolerates piperacillin/tazobactam, PCN G  . Codeine Nausea And Vomiting  . Epinephrine Other (See Comments)    Reaction:  Fainting   . Morphine And Related Other (See Comments)    Reaction:  GI upset   . Buprenorphine Hcl Nausea And Vomiting  . Ciprofloxacin Itching, Swelling and Rash  . Latex Rash  . Lidocaine Rash  . Procaine Rash    VITALS:  Blood pressure (!) 135/57, pulse 76, temperature 98.2 F (36.8 C), temperature source Oral, resp. rate 18, height 5\' 7"  (1.702 m), weight 77 kg, SpO2 97 %.  PHYSICAL EXAMINATION:   Physical Exam  GENERAL:  85 y.o.-year-old patient lying in  the bed with no acute distress.  EYES: Pupils equal, round, reactive to light and accommodation. No scleral icterus. Extraocular muscles intact.  HEENT: Head atraumatic, normocephalic. Oropharynx and nasopharynx clear.  NECK:  Supple, no jugular venous distention. No thyroid enlargement, no tenderness.  LUNGS: Normal breath sounds bilaterally, no wheezing, rales, rhonchi. No use of accessory muscles of respiration.  CARDIOVASCULAR: S1, S2 normal. No murmurs, rubs, or gallops.  ABDOMEN: Soft, nontender, nondistended. Bowel sounds present. No organomegaly or mass.  EXTREMITIES: bilateral lower extremity chronic venous stasis. Cellulitis resolved in the left tibial shin. There is a laceration with clotted present along with sutures appears to wound gaping secondary to edema. NEUROLOGIC: Cranial nerves II through XII are intact. No focal Motor or sensory deficits b/l.   PSYCHIATRIC:  patient is alert and oriented x 3.  SKIN: No obvious rash, lesion, or ulcer.   LABORATORY PANEL:  CBC Recent Labs  Lab 09/04/18 0226  WBC 8.7  HGB 8.4*  HCT 27.3*  PLT 234    Chemistries  Recent Labs  Lab 09/03/18 0019 09/04/18 0226  NA 141 142  K 3.9 3.8  CL 103 105  CO2 28 30  GLUCOSE 255* 112*  BUN 34* 25*  CREATININE 1.07* 0.98  CALCIUM 8.9 8.4*  AST 16  --   ALT 11  --   ALKPHOS 81  --   BILITOT 0.7  --    Cardiac Enzymes No results for input(s): TROPONINI in the last 168 hours. RADIOLOGY:  No results found. ASSESSMENT AND PLAN:  85 year old admitted with lower extremity cellulitis and pain in her ankle  and clavicle.  1.  Left ankle cellulitis appears to have improved continue current antibiotics likely switch to oral clindamycin -wound care nurse to see if sutures can be removed. Wound gaping noted due to chronic edema in both lower extremities. -Blood cultures negative -no fever -white count normal  2.  Sprain of the left shoulder with possible occult fracture supportive care no  sling recommended per Ortho-- Dr. Sabra Heck  3.  Sprain of the left knee with no sign of fracture ambulated with walker using weightbearing as tolerated on the left per Ortho -physical therapy to see patient  4.  History of congestive heart heart failure continue Lasix  5.  Diabetes type 2 continue sliding-scale insulin and Lantus, continue metformin  6.  History of COPD without exacerbation continue nebulizers as needed  7.  History of A. fib continue Xarelto as doing at home   Return to ALF tomorrow  CODE STATUS: DNR  DVT Prophylaxis: xarelto  TOTAL TIME TAKING CARE OF THIS PATIENT: *30* minutes.  >50% time spent on counselling and coordination of care  POSSIBLE D/C IN 1 DAYS, DEPENDING ON CLINICAL CONDITION.  Note: This dictation was prepared with Dragon dictation along with smaller phrase technology. Any transcriptional errors that result from this process are unintentional.  [name] M.D on 09/06/2018 at 11:37 AM  Between 7am to 6pm - Pager - [phone]  After 6pm go to [email or web address] - password [email or web address] [facility]  Office  [phone]  CC: Primary care physician; Housecalls, Doctors MakingPatient ID: [name], female   DOB: [birth year], 85 y.o.   MRN: [id number]

## 2018-09-06 NOTE — TOC Progression Note (Signed)
Transition of Care [facility]) - Progression Note    Patient Details  [name] MRN: [id number] Date of Birth: Feb 22, 1925  Transition of [facility] CM/SW [place], [place] Phone Number: 09/06/2018, 3:49 PM  Clinical Narrative:   The CSW spoke with the patient's daughter regarding the ongoing discharge barrier for her mother returning to [facility]. According to Regan Lemming (DON at [facility]) reports having had a face-to-face assessment with the patient. According to the patient, no one from [facility] was in her room on Friday. The CSW advised the patient's daughter that the CSW has attempted multiple times both on 3/14 and today to contact [facility] with no answer. The CSW advised that they seem to have a different phone system which may be part of the issue. The patient's daughter shared that she and the patient continue to want the patient to return to the ALF with HHPT as recommended by PT.   The CSW updated [facility] leadership [facility]) of the barrier to discharge. TOC team is following and will continue to attempt contact with [facility] during regular business hours.   Expected Discharge Plan: Assisted Living Barriers to Discharge: Continued Medical Work up  Expected Discharge Plan and Services Expected Discharge Plan: Assisted Living   Post Acute Care Choice: Home Health, Resumption of Svcs/PTA Provider Living arrangements for the past 2 months: Assisted Living Facility                     HH Arranged: PT, Nurse's Aide, Social Work     Social Determinants of Health (SDOH) Interventions  None indicated  Readmission Risk Interventions 30 Day Unplanned Readmission Risk Score     ED to Hosp-Admission (Current) from 09/02/2018 in [place] (1A)  30 Day Unplanned Readmission Risk Score (%)  51 Filed at 09/06/2018 1200     This score is the patient's risk of an unplanned readmission within 30 days of being  discharged (0 -100%). The score is based on dignosis, age, lab data, medications, orders, and past utilization.   Low:  0-14.9   Medium: 15-21.9   High: 22-29.9   Extreme: 30 and above       Readmission Risk Prevention Plan 09/04/2018  Transportation Screening Complete  Medication Review Press photographer) Complete  HRI or [place] Complete  SW Recovery Care/Counseling Consult Complete  Palliative Care Screening Complete  [place] Not Complete  SNF Comments PT recommended home health, patient is from [facility].  Some recent data might be hidden

## 2018-09-07 LAB — CBC
HEMATOCRIT: 26.3 % — AB (ref 36.0–46.0)
Hemoglobin: 7.8 g/dL — ABNORMAL LOW (ref 12.0–15.0)
MCH: 28.1 pg (ref 26.0–34.0)
MCHC: 29.7 g/dL — ABNORMAL LOW (ref 30.0–36.0)
MCV: 94.6 fL (ref 80.0–100.0)
Platelets: 207 10*3/uL (ref 150–400)
RBC: 2.78 MIL/uL — ABNORMAL LOW (ref 3.87–5.11)
RDW: 17.2 % — ABNORMAL HIGH (ref 11.5–15.5)
WBC: 7.3 10*3/uL (ref 4.0–10.5)
nRBC: 0 % (ref 0.0–0.2)

## 2018-09-07 LAB — GLUCOSE, CAPILLARY
Glucose-Capillary: 67 mg/dL — ABNORMAL LOW (ref 70–99)
Glucose-Capillary: 149 mg/dL — ABNORMAL HIGH (ref 70–99)
Glucose-Capillary: 235 mg/dL — ABNORMAL HIGH (ref 70–99)
Glucose-Capillary: 205 mg/dL — ABNORMAL HIGH (ref 70–99)

## 2018-09-07 MED ORDER — OXYCODONE HCL 5 MG PO TABS: 5.0000 mg | ORAL_TABLET | Freq: Four times a day (QID) | ORAL | 0 refills | Status: AC | PRN

## 2018-09-07 MED ORDER — OXYCODONE HCL 5 MG PO TABS: 5.0000 mg | ORAL_TABLET | Freq: Four times a day (QID) | ORAL | 0 refills | Status: DC | PRN

## 2018-09-07 MED ORDER — DOXYCYCLINE HYCLATE 100 MG PO TABS: 100.0000 mg | ORAL_TABLET | Freq: Two times a day (BID) | ORAL | 0 refills | Status: DC

## 2018-09-07 MED ORDER — INSULIN GLARGINE 100 UNIT/ML ~~LOC~~ SOLN: 20.0000 [IU] | Freq: Every day | SUBCUTANEOUS | 11 refills | Status: AC

## 2018-09-07 NOTE — Progress Notes (Signed)
Physical Therapy Treatment Patient Details [name] MRN: [id number] DOB: [birth year] Today's Date: 09/07/2018    History of Present Illness From MD H&P: Pt is a 85 year old admitted with lower extremity cellulitis and pain in her ankle and clavicle.  Assessment includes: Left ankle cellulitis, sprain of the left shoulder with possible occult fracture supportive care no sling recommended per Ortho, sprain of the left knee with no sign of fracture per ortho, CHF, DM II, COPD, and A-fib.      PT Comments    Pt presents with deficits in strength, transfers, mobility, gait, balance, and activity tolerance but is eager to participate with PT services and is making good progress towards goals.  Pt was Mod Ind with bed mobility tasks with improved speed and control.  Pt required training and cues for proper sequencing during transfers but with correct sequencing was able to stand from multiple height surfaces without physical assistance.  Pt was able to amb 1 x 30' and 1 x 86' with a RW and CGA.  Pt's SpO2 dropped to 81% after amb 30' on room air down from 93% at rest, MD notified and pt returned to 2LO2/min.  Pt's SpO2 was 92% after amb 60' on 2LO2/min with HR WNL.  Pt will benefit from HHPT services and supervision with mobility upon discharge to safely address above deficits for decreased caregiver assistance and eventual return to PLOF.     Follow Up Recommendations  Home health PT;Supervision for mobility/OOB     Equipment Recommendations  None recommended by PT    Recommendations for Other Services       Precautions / Restrictions Precautions Precautions: Fall Restrictions Weight Bearing Restrictions: Yes LLE Weight Bearing: Weight bearing as tolerated    Mobility  Bed Mobility Overal bed mobility: Modified Independent             General bed mobility comments: Extra time and effort but no physical assistance needed  Transfers Overall transfer level: Needs  assistance Equipment used: Rolling walker (2 wheeled) Transfers: Sit to/from Stand Sit to Stand: Min guard         General transfer comment: Pt required mod verbal cues for proper sequencing for anterior weight shift and hand placement but did not require physical assistance to stand  Ambulation/Gait Ambulation/Gait assistance: Min guard Gait Distance (Feet): 60 Feet Assistive device: Rolling walker (2 wheeled) Gait Pattern/deviations: Step-through pattern;Decreased step length - right;Decreased step length - left Gait velocity: decreased   General Gait Details: No c/o LUE pain this session with amb; SpO2 81% after amb 30' on room air down from 93% at rest, MD notified and pt returned to 2LO2/min.  SpO2 92% after amb 60' on 2LO2/min with HR WNL.    Stairs             Wheelchair Mobility    Modified Rankin (Stroke Patients Only)       Balance Overall balance assessment: Needs assistance   Sitting balance-Leahy Scale: Normal     Standing balance support: Bilateral upper extremity supported Standing balance-Leahy Scale: Fair                              Cognition Arousal/Alertness: Awake/alert Behavior During Therapy: WFL for tasks assessed/performed Overall Cognitive Status: Within Functional Limits for tasks assessed  Exercises Total Joint Exercises Ankle Circles/Pumps: Strengthening;Both;10 reps Quad Sets: Strengthening;Both;10 reps Gluteal Sets: Strengthening;Both;10 reps Hip ABduction/ADduction: AROM;Both;10 reps Straight Leg Raises: AAROM;Both;10 reps Long Arc Quad: Strengthening;Both;10 reps;15 reps Knee Flexion: Strengthening;Both;10 reps;15 reps Other Exercises Other Exercises: HEP education for BLE APs, QS, GS, and LAQ x 10 each 5-6x/day Other Exercises: Anterior weight shifting activities in sitting for improved performance with transfers Other Exercises: Multiple sit to/from  stand transfer training from various surfaces    General Comments        Pertinent Vitals/Pain Pain Assessment: No/denies pain    Home Living                      Prior Function            PT Goals (current goals can now be found in the care plan section) Progress towards PT goals: Progressing toward goals    Frequency    Min 2X/week      PT Plan Current plan remains appropriate    Co-evaluation              AM-PAC PT "6 Clicks" Mobility   Outcome Measure  Help needed turning from your back to your side while in a flat bed without using bedrails?: None Help needed moving from lying on your back to sitting on the side of a flat bed without using bedrails?: None Help needed moving to and from a bed to a chair (including a wheelchair)?: A Little Help needed standing up from a chair using your arms (e.g., wheelchair or bedside chair)?: A Little Help needed to walk in hospital room?: A Little Help needed climbing 3-5 steps with a railing? : A Lot 6 Click Score: 19    End of Session Equipment Utilized During Treatment: Gait belt;Oxygen Activity Tolerance: Patient tolerated treatment well Patient left: in chair;with chair alarm set;with call bell/phone within reach;with nursing/sitter in room Nurse Communication: Mobility status;Other (comment)(Pt response to amb on room air per above) PT Visit Diagnosis: Muscle weakness (generalized) (M62.81);Difficulty in walking, not elsewhere classified (R26.2);Pain     Time: 0388-8280 PT Time Calculation (min) (ACUTE ONLY): 39 min  Charges:  $Gait Training: 8-22 mins $Therapeutic Exercise: 8-22 mins $Therapeutic Activity: 8-22 mins                     D. Scott Kelseigh Diver PT, DPT 09/07/18, 10:58 AM

## 2018-09-07 NOTE — Progress Notes (Signed)
Spoke with Dr. Sidney Ace pt with a hemoglobin of 7.8. No new orders at this time.

## 2018-09-07 NOTE — Progress Notes (Signed)
EMS called for transport. [place] called for report to [facility] for clarification of insulin and finger sticks.

## 2018-09-07 NOTE — Discharge Summary (Addendum)
[facility] [name]    MR#:  [id number]  DATE OF BIRTH:  September 26, 1924  DATE OF ADMISSION:  09/02/2018 ADMITTING PHYSICIAN: Arta Silence, MD  DATE OF DISCHARGE:09/07/2018  PRIMARY CARE PHYSICIAN: [facility]    ADMISSION DIAGNOSIS:  [facility] - Fall  DISCHARGE DIAGNOSIS:  left tibial shin cellulitis/laceration improving generalized weakness  SECONDARY DIAGNOSIS:   Past Medical History:  Diagnosis Date  . Cancer ([place])    skin ca  . CHF (congestive heart failure) ([place])   . Chronic back pain   . COPD (chronic obstructive pulmonary disease) ([place])   . Diabetes mellitus without complication ([place])   . Hypertension   . Pacemaker    2015  . Poor perfusion of leg     HOSPITAL COURSE:  85 year old admitted with lower extremity cellulitis and pain in her ankle and clavicle.  1.Left LE cellulitis appears to have improved continue current antibiotics  -Wound care nurse  Removed sutures--cont wound care per instructions -Wound gaping noted due to chronic edema in both lower extremities. -Blood cultures negative -no fever -white count normal  2.Sprain of the left shoulder with possible occult fracture supportive care no sling recommended per Ortho-- Dr. Sabra Heck  3.Sprain of the left knee with no sign of fracture ambulated with walker using weightbearing as tolerated on the left per Ortho -physical therapy has evaluated patient today. Per therapy patient just needs some coaching and she walked 60 feet with him.  4.History of congestive heart heart failure continue Lasix -to room air. Patient wears oxygen nighttime. -sats 100 percent on 2 L -patient has chronic venous stasis  5.Diabetes type 2 continue sliding-scale insulin and Lantus, continue metformin  6.History of COPD without exacerbation continue nebulizers as needed  7.History of A. fib continue Xarelto as doing at  home  overall patient is best at baseline. She will discharge to assisted living facility with physical therapy. CONSULTS OBTAINED:  Treatment Team:  Arta Silence, MD Earnestine Leys, MD  DRUG ALLERGIES:   Allergies  Allergen Reactions  . Ceftriaxone Swelling    Tolerates piperacillin/tazobactam, PCN G  . Cefuroxime Hives    Tolerates piperacillin/tazobactam, PCN G  . Cephalosporins Swelling and Other (See Comments)    Reaction:  Fainting/dry mouth  Tolerates piperacillin/tazobactam, PCN G  . Codeine Nausea And Vomiting  . Epinephrine Other (See Comments)    Reaction:  Fainting   . Morphine And Related Other (See Comments)    Reaction:  GI upset   . Buprenorphine Hcl Nausea And Vomiting  . Ciprofloxacin Itching, Swelling and Rash  . Latex Rash  . Lidocaine Rash  . Procaine Rash    DISCHARGE MEDICATIONS:   Allergies as of 09/07/2018      Reactions   Ceftriaxone Swelling   Tolerates piperacillin/tazobactam, PCN G   Cefuroxime Hives   Tolerates piperacillin/tazobactam, PCN G   Cephalosporins Swelling, Other (See Comments)   Reaction:  Fainting/dry mouth  Tolerates piperacillin/tazobactam, PCN G   Codeine Nausea And Vomiting   Epinephrine Other (See Comments)   Reaction:  Fainting    Morphine And Related Other (See Comments)   Reaction:  GI upset    Buprenorphine Hcl Nausea And Vomiting   Ciprofloxacin Itching, Swelling, Rash   Latex Rash   Lidocaine Rash   Procaine Rash      Medication List    STOP taking these medications   amoxicillin 875 MG tablet Commonly known as:  AMOXIL  TAKE these medications   acetaminophen 325 MG tablet Commonly known as:  TYLENOL Take 2 tablets (650 mg total) by mouth every 6 (six) hours as needed for mild pain (or Fever >/= 101).   aspirin EC 81 MG tablet Take 81 mg by mouth daily.   baclofen 10 MG tablet Commonly known as:  LIORESAL Take 10 mg by mouth 3 (three) times daily as needed (pain).   buPROPion 300  MG 24 hr tablet Commonly known as:  WELLBUTRIN XL Take 300 mg by mouth daily.   busPIRone 5 MG tablet Commonly known as:  BUSPAR Take 5 mg by mouth 2 (two) times daily.   carvedilol 6.25 MG tablet Commonly known as:  COREG Take 6.25 mg by mouth 2 (two) times daily with a meal.   cholecalciferol 1000 units tablet Commonly known as:  VITAMIN D Take 2,000 Units by mouth daily.   docusate sodium 100 MG capsule Commonly known as:  COLACE Take 100 mg by mouth 2 (two) times daily.   doxycycline 100 MG tablet Commonly known as:  VIBRA-TABS Take 1 tablet (100 mg total) by mouth every 12 (twelve) hours.   escitalopram 10 MG tablet Commonly known as:  LEXAPRO Take 10 mg by mouth daily.   ferrous sulfate 325 (65 FE) MG tablet Take 325 mg by mouth daily with breakfast.   fexofenadine 60 MG tablet Commonly known as:  ALLEGRA Take 60 mg by mouth 2 (two) times daily.   furosemide 40 MG tablet Commonly known as:  Lasix Take 1 tablet (40 mg total) by mouth 2 (two) times daily.   gabapentin 100 MG capsule Commonly known as:  NEURONTIN Take 100-200 mg by mouth See admin instructions. Take 1 capsule (100mg ) by mouth every night at bedtime and 1 additional capsule by mouth at bedtime as needed for leg pain   insulin glargine 100 UNIT/ML injection Commonly known as:  LANTUS Inject 0.2 mLs (20 Units total) into the skin at bedtime. What changed:  how much to take   ipratropium-albuterol 0.5-2.5 (3) MG/3ML Soln Commonly known as:  DUONEB Take 3 mLs by nebulization every 6 (six) hours as needed (wheezing). What changed:    when to take this  reasons to take this   magnesium oxide 400 MG tablet Commonly known as:  MAG-OX Take 400 mg by mouth daily.   Melatonin 3 MG Tabs Take 3 mg by mouth at bedtime.   metFORMIN 750 MG 24 hr tablet Commonly known as:  GLUCOPHAGE-XR Take 750 mg by mouth daily.   metoCLOPramide 5 MG tablet Commonly known as:  REGLAN Take 5 mg by mouth every 6  (six) hours as needed for nausea or vomiting.   multivitamin tablet Take 1 tablet by mouth daily.   oxyCODONE 5 MG immediate release tablet Commonly known as:  Oxy IR/ROXICODONE Take 1 tablet (5 mg total) by mouth every 6 (six) hours as needed for moderate pain or severe pain. What changed:    how much to take  Another medication with the same name was removed. Continue taking this medication, and follow the directions you see here.   pantoprazole 40 MG tablet Commonly known as:  PROTONIX Take 40 mg by mouth daily.   polyethylene glycol packet Commonly known as:  MIRALAX / GLYCOLAX Take 17 g by mouth daily.   ProAir HFA 108 (90 Base) MCG/ACT inhaler Generic drug:  albuterol Inhale 2 puffs into the lungs every 6 (six) hours as needed for wheezing or shortness of breath.  Rivaroxaban 15 MG Tabs tablet Commonly known as:  XARELTO Take 15 mg by mouth daily.   rOPINIRole 0.5 MG tablet Commonly known as:  REQUIP Take 0.5 mg by mouth at bedtime.   senna 8.6 MG Tabs tablet Commonly known as:  SENOKOT Take 1 tablet (8.6 mg total) by mouth daily.   sodium phosphate 7-19 GM/118ML Enem Place 1 enema rectally daily as needed for severe constipation.   spironolactone 25 MG tablet Commonly known as:  ALDACTONE Take 0.5 tablets (12.5 mg total) by mouth daily.   traZODone 50 MG tablet Commonly known as:  DESYREL Take 25 mg by mouth at bedtime.   vitamin B-12 1000 MCG tablet Commonly known as:  CYANOCOBALAMIN Take 1,000 mcg by mouth daily.   vitamin C 500 MG tablet Commonly known as:  ASCORBIC ACID Take 500 mg by mouth daily.   Vitamin D (Ergocalciferol) 1.25 MG (50000 UT) Caps capsule Commonly known as:  DRISDOL Take 50,000 Units by mouth every Thursday.       If you experience worsening of your admission symptoms, develop shortness of breath, life threatening emergency, suicidal or homicidal thoughts you must seek medical attention immediately by calling 911 or  calling your MD immediately  if symptoms less severe.  You Must read complete instructions/literature along with all the possible adverse reactions/side effects for all the Medicines you take and that have been prescribed to you. Take any new Medicines after you have completely understood and accept all the possible adverse reactions/side effects.   Please note  You were cared for by a hospitalist during your hospital stay. If you have any questions about your discharge medications or the care you received while you were in the hospital after you are discharged, you can call the unit and asked to speak with the hospitalist on call if the hospitalist that took care of you is not available. Once you are discharged, your primary care physician will handle any further medical issues. Please note that NO REFILLS for any discharge medications will be authorized once you are discharged, as it is imperative that you return to your primary care physician (or establish a relationship with a primary care physician if you do not have one) for your aftercare needs so that they can reassess your need for medications and monitor your lab values. Today   SUBJECTIVE   No new complaints were patient  VITAL SIGNS:  Blood pressure (!) 138/59, pulse 65, temperature 97.7 F (36.5 C), resp. rate 18, height 5\' 7"  (1.702 m), weight 77 kg, SpO2 95 %.  I/O:    Intake/Output Summary (Last 24 hours) at 09/07/2018 1220 Last data filed at 09/07/2018 0710 Gross per 24 hour  Intake -  Output 805 ml  Net -805 ml    PHYSICAL EXAMINATION:  GENERAL:  85 y.o.-year-old patient lying in the bed with no acute distress.  EYES: Pupils equal, round, reactive to light and accommodation. No scleral icterus. Extraocular muscles intact.  HEENT: Head atraumatic, normocephalic. Oropharynx and nasopharynx clear.  NECK:  Supple, no jugular venous distention. No thyroid enlargement, no tenderness.  LUNGS: decreased  breath sounds  bilaterally, no wheezing, rales,rhonchi or crepitation. No use of accessory muscles of respiration.no Respiratory distress CARDIOVASCULAR: S1, S2 normal. No murmurs, rubs, or gallops.  ABDOMEN: Soft, non-tender, non-distended. Bowel sounds present. No organomegaly or mass.  EXTREMITIES: venous stasis both lower extremity. Left tibial shin laceration dressing present NEUROLOGIC: Cranial nerves II through XII are intact. Muscle strength 5/5 in all extremities.  Sensation intact. Gait not checked.  PSYCHIATRIC: The patient is alert and oriented x 3.  SKIN: No obvious rash, lesion, or ulcer.   DATA REVIEW:   CBC  Recent Labs  Lab 09/07/18 0257  WBC 7.3  HGB 7.8*  HCT 26.3*  PLT 207    Chemistries  Recent Labs  Lab 09/03/18 0019 09/04/18 0226  NA 141 142  K 3.9 3.8  CL 103 105  CO2 28 30  GLUCOSE 255* 112*  BUN 34* 25*  CREATININE 1.07* 0.98  CALCIUM 8.9 8.4*  AST 16  --   ALT 11  --   ALKPHOS 81  --   BILITOT 0.7  --     Microbiology Results   Recent Results (from the past 240 hour(s))  Blood culture (routine x 2)     Status: None (Preliminary result)   Collection Time: 09/03/18 12:19 AM  Result Value Ref Range Status   Specimen Description BLOOD LEFT AC  Final   Special Requests   Final    BOTTLES DRAWN AEROBIC AND ANAEROBIC Blood Culture adequate volume   Culture   Final    NO GROWTH 4 DAYS Performed at [facility], [address] [place], [place] [address]    Report Status PENDING  Incomplete  Blood culture (routine x 2)     Status: None (Preliminary result)   Collection Time: 09/03/18 12:19 AM  Result Value Ref Range Status   Specimen Description BLOOD LEFT HAND  Final   Special Requests   Final    BOTTLES DRAWN AEROBIC AND ANAEROBIC Blood Culture adequate volume   Culture   Final    NO GROWTH 4 DAYS Performed at [facility], [address], [place], [place] [address]    Report Status PENDING  Incomplete    RADIOLOGY:  No  results found.   CODE STATUS:     Code Status Orders  (From admission, onward)         Start     Ordered   09/03/18 1459  Do not attempt resuscitation (DNR)  Continuous    Question Answer Comment  In the event of cardiac or respiratory ARREST Do not call a "code blue"   In the event of cardiac or respiratory ARREST Do not perform Intubation, CPR, defibrillation or ACLS   In the event of cardiac or respiratory ARREST Use medication by any route, position, wound care, and other measures to relive pain and suffering. May use oxygen, suction and manual treatment of airway obstruction as needed for comfort.      09/03/18 1458        Code Status History    Date Active Date Inactive Code Status Order ID Comments User Context   09/03/2018 0522 09/03/2018 1458 Full Code [id number]  Arta Silence, MD Inpatient   07/06/2018 1311 07/09/2018 2056 DNR [id number]  Demetrios Loll, MD Inpatient   07/05/2018 1337 07/06/2018 1311 Full Code [id number]  Gladstone Lighter, MD Inpatient   06/30/2018 0352 07/03/2018 1557 DNR [id number]  Salary, Avel Peace, MD ED   06/14/2018 1501 06/16/2018 1634 DNR [id number]  Epifanio Lesches, MD ED   06/02/2018 0138 06/03/2018 1933 DNR [id number]  Arta Silence, MD ED   05/17/2018 0118 05/18/2018 1826 DNR [id number]  Amelia Jo, MD Inpatient   12/11/2017 2059 2020/10/3117 2127 DNR [id number]  Hillary Bow, MD ED   07/15/2017 1830 07/19/2017 1845 DNR [id number]  Gorden Harms, MD Inpatient   04/14/2017 0344 04/18/2017 2138 Full Code [id number]  Jannifer Franklin,  Shanon Brow, MD Inpatient   12/08/2016 0107 12/09/2016 2031 Full Code [id number]  Lance Coon, MD ED   09/07/2015 0441 09/13/2015 1955 Full Code [id number]  Saundra Shelling, MD Inpatient   09/01/2015 1533 09/03/2015 1907 Full Code [id number]  Epifanio Lesches, MD ED   03/21/2015 0626 03/22/2015 1939 Full Code [id number]  Harrie Foreman, MD Inpatient      TOTAL TIME TAKING CARE OF THIS PATIENT: *40* minutes.    [name] M.D  on 09/07/2018 at 12:20 PM  Between 7am to 6pm - Pager - [phone] After 6pm go to [email or web address] - Proofreader  [facility]  Office  [phone]  CC: Primary care physician; [facility]

## 2018-09-07 NOTE — TOC Transition Note (Signed)
Transition of Care [facility]) - CM/SW Discharge Note   Patient Details  [name] MRN: [id number] Date of Birth: 12/05/1924  Transition of Care [facility]) CM/SW Contact:  [name], LCSW Phone Number: 09/07/2018, 2:13 PM   Clinical Narrative:       Final next level of care: [place] Barriers to Discharge: Continued Medical Work up   Patient Goals and CMS Choice Patient states their goals for this hospitalization and ongoing recovery are:: To return back to ALF with home health to continue. CMS Medicare.gov Compare Post Acute Care list provided to:: Patient Represenative (must comment)(Patient's daughter ) Choice offered to / list presented to : Adult Children  Discharge Placement  Patient to be d/c'ed today to [facility]F.  Patient and family agreeable to plans will transport via ems.  CSW spoke with patient's daughter, she is aware that patient is discharging today with home health.  Evette Cristal, MSW, LCSW [phone]                      Discharge Plan and Services   Post Acute Care Choice: Home Health, Resumption of Svcs/PTA Provider              [facility] Arranged: PT, Nurse's Aide, Social Work     Social Determinants of Health (SDOH) Interventions     Readmission Risk Interventions Readmission Risk Prevention Plan 09/04/2018  Transportation Screening Complete  Medication Review Press photographer) Complete  HRI or [place] Complete  SW Recovery Care/Counseling Consult Complete  Palliative Care Screening Complete  [place] Not Complete  SNF Comments PT recommended home health, patient is from [place] [facility].  Some recent data might be hidden

## 2018-09-07 NOTE — TOC Transition Note (Signed)
Transition of Care [facility]) - CM/SW Discharge Note   Patient Details  [name] MRN: [id number] Date of Birth: 04-Aug-1924  Transition of Care [facility]) CM/SW Contact:  Marshell Garfinkel, RN Phone Number: 09/07/2018, 2:21 PM   Clinical Narrative:    Patient will discharge back to [facility] by EMS. She will resume care with [facility] home health and home Palliative. Both Cassie with [facility] and Santiago Glad with Palliative team have been updated.     Final next level of care: [place] Barriers to Discharge: Continued Medical Work up   Patient Goals and CMS Choice Patient states their goals for this hospitalization and ongoing recovery are:: To return back to ALF with home health to continue. CMS Medicare.gov Compare Post Acute Care list provided to:: Patient Represenative (must comment)(Patient's daughter ) Choice offered to / list presented to : Patient  Discharge Placement                Patient to be transferred to facility by: [facility] Name of family member notified: Patient's daughter [name] [phone] Patient and family notified of of transfer: 09/07/18  Discharge Plan and Services   Post Acute Care Choice: Home Health, Resumption of Svcs/PTA Provider              HH Arranged: PT, Nurse's Aide, Social Work     Social Determinants of Health ([place]) Interventions     Readmission Risk Interventions Readmission Risk Prevention Plan 09/07/2018 09/04/2018  Transportation Screening - Complete  Medication Review Press photographer) - Complete  PCP or Specialist appointment within 3-5 days of discharge Complete -  [place] or [place] - Complete  SW Recovery Care/Counseling Consult - Complete  [place] - Not Complete  SNF Comments - PT recommended home health, patient is from [facility].  Some recent data might be hidden

## 2018-09-07 NOTE — Care Management Important Message (Signed)
Important Message  Patient Details  [name] MRN: [id number] Date of Birth: [birth year]   Medicare Important Message Given:  Yes    Dannette Barbara 09/07/2018, 10:01 AM

## 2018-09-07 NOTE — Care Management (Signed)
RNCM met with patient as she waits to return to [facility] ALF.  She is on nocturnal O2 through [place] so that will need to be weaned.  She is open to [facility] home health for PT. She will need wound care; MD updated. Patient will also need order to resume palliative care.  Patient ambulates with walker at baseline.

## 2018-09-07 NOTE — Progress Notes (Signed)
Inpatient Diabetes Program Recommendations  AACE/ADA: New Consensus Statement on Inpatient Glycemic Control (2015)  Target Ranges:  Prepandial:   less than 140 mg/dL      Peak postprandial:   less than 180 mg/dL (1-2 hours)      Critically ill patients:  140 - 180 mg/dL   Results for [name], [name] (MRN [id number]) as of 09/07/2018 08:48  Ref. Range 09/06/2018 07:47 09/06/2018 11:40 09/06/2018 12:00 09/06/2018 16:46 09/06/2018 21:00  Glucose-Capillary Latest Ref Range: 70 - 99 mg/dL 192 (H)  3 units NOVOLOG  229 (H)  5 units NOVOLOG  248 (H) 155 (H)  3 units NOVOLOG  265 (H)  3 units NOVOLOG +  20 units LANTUS   Results for [name], [name] (MRN [id number]) as of 09/07/2018 08:48  Ref. Range 09/07/2018 07:46 09/07/2018 08:35  Glucose-Capillary Latest Ref Range: 70 - 99 mg/dL 67 (L) 149 (H)    Admit with: Left ankle cellulitis/ Sprain of the left shoulder/ Sprain of the left knee   History: DM, CHF, COPD  Home DM Meds: Lantus 30 units QHS         Metformin 750 mg Daily  Current Orders: Lantus 20 units QHS      Novolog Moderate Correction Scale/ SSI (0-15 units) TID AC + HS    Mild Hypoglycemia this AM after getting 20 units Lantus last PM.   MD- Please consider the following in-hospital insulin adjustments:  1. Decrease Lantus dose slightly to 17 units QHS (15% reduction)  2. Start Novolog Meal Coverage: Novolog 3 units TID with meals  (Please add the following Hold Parameters: Hold if pt eats <50% of meal, Hold if pt NPO)     --Will follow patient during hospitalization--  Wyn Quaker RN, MSN, CDE Diabetes Coordinator Inpatient Glycemic Control Team Team Pager: [phone] (8a-5p)

## 2018-09-07 NOTE — Discharge Instructions (Addendum)
Left tibial shin laceration per wound care nurse.  Check blood sugars  as before per ALF protocol

## 2018-09-07 NOTE — Clinical Social Work Note (Addendum)
CSW attempted to speak with Levada Dy DON at [facility], she was not available, CSW was transferred to the supervisor [place] voice mail, message left awaiting for call back from ALF.  10:45am  CSW contacted Furniture conservator/restorer (ED) at [phone] the Development worker, international aid, and discussed patient returning to ALF.  CSW updated her on how patient did with PT this morning, CSW informed her of the distance patient walked.  CSW also informed her that this CSW was not informed that Levada Dy DON came to see patient Friday evening.  CSW informed her that CSW spoke to [place] at 3:50pm on Friday 09/04/18 and she was going to talk with ED to arrange a time to assess patient and call CSW back, but CSW never heard when she was going to see patient.  ED was saying that patient needs to go to SNF because she will need 5 days a week of therapy, verse home health PT.  CSW told her that PT worked with patient and they ar still recommending home health PT.  CSW informed ED that if PT is recommending home health, most likely she will not be approved for SNF placement through insurance company.  CSW faxed updated PT note, discharge summary, and FL2 to [place], [phone].  12:00pm  CSW received phone call from [place] at [facility], [phone], she said they will accept patient back if home health PT is ordered 5x a week, and oxydone needs to be specified for either one tablet or two tablets.  CSW updated, physician, [place] faxed updated discharge summary and FL2 with changes, CSW faxed updated information to ALF.  1:20pm CSW spoke to patient's daughter, CSW updated patient's daughter that [facility] agreed to accept patient back with home health, they are requesting 5x a week.  Patient to be d/c'ed today to [facility].  Patient and family agreeable to plans will transport via ems.    Jones Broom. Luetta Piazza, MSW, LCSW [phone]  09/07/2018 9:54 AM

## 2018-09-08 LAB — CULTURE, BLOOD (ROUTINE X 2)
Culture: NO GROWTH
Culture: NO GROWTH
Special Requests: ADEQUATE
Special Requests: ADEQUATE

## 2018-10-23 ENCOUNTER — Other Ambulatory Visit: Payer: Self-pay

## 2018-10-23 ENCOUNTER — Non-Acute Institutional Stay: Payer: Medicare Other | Admitting: Primary Care

## 2018-10-23 DIAGNOSIS — R531 Weakness: Secondary | ICD-10-CM

## 2018-10-23 DIAGNOSIS — Z515 Encounter for palliative care: Secondary | ICD-10-CM

## 2018-10-23 NOTE — Progress Notes (Signed)
[facility] Palliative Care Consult Note Telephone: [phone]       Fax: [phone]  TELEHEALTH VISIT STATEMENT Due to the COVID-19 crisis, this visit was done via telemedicine from my office. It was initiated and consented to by this patient and/or family.  PATIENT [name] DOB: [birth year] MRN: [id number]  PRIMARY CARE PROVIDER:   [facility] [name] and [name]  REFERRING PROVIDER:  [facility] [name] [address], [place] [address]  RESPONSIBLE PARTY:    Extended Emergency Contact Information Primary Emergency Contact: [name],Victoria Address: [address]          [place], [place] [address] Johnnette Litter of [place] Phone: [phone] Mobile Phone: [phone] Relation: Daughter Secondary Emergency Contact: Touchton,Curtis Y Address: [address]          [place], [place] [address] Johnnette Litter of [place] Phone: [phone] Mobile Phone: [phone] Relation: Spouse   Palliative Care was asked to follow patient by consultation request of Dr. Orvis Brill, [facility]. This is a follow up visit.  ASSESSMENT and RECOMMENDATIONS:   1.Wound on left LE:  I recommend cleaning with saline, apply alginate to would beds, apply foam dressing (bordered) use kerlix and very light compression with coban dressing, changing 3 x. / week. May consider a wound clinic referral. Wound was sustained a month ago. The usual abd dressing does not keep the wound hydrated enough; foam is a better choice.  It is painful to the patient, and bioburden and non-healing is a danger if not dressed correctly at all times. Needs compression to optimize circulation.  2. Pain management- Scheduled  tylenol 650 mg ER (arthritis) q 8 hrs.  Currently bid. Add prn narcotic 5 mg oyxcodone every 4 hrs prn  for painful wound. Increase senna to bid with addition of these medications.  3. Appetite: states  she's eating half her meals, Needs glucerna with protein for wound healing. Her wound will stall without significant improved protein intake, 30-50 g protein extra daily for wound healing.   4. Sleep poor due to pain: Uses oxycodone for hs, may need melatonin 3 mg. Address pain management as above as well for painful wound.  Palliative care will continue to follow for goals of care clarification and symptom management. Return 2-3 weeks or prn.  I spent 25 minutes providing this consultation,  from 1145 to 1210. More than 50% of the time in this consultation was spent coordinating communication.   HISTORY OF PRESENT ILLNESS:  [name] is a 85 y.o. year old female with multiple medical problems including LE wound, COPD, CHF, DM, poor leg perfusion. Palliative Care was asked to help address goals of care.   CODE STATUS: DNR  PPS: 40% HOSPICE ELIGIBILITY/DIAGNOSIS: TBD  PAST MEDICAL HISTORY:  Past Medical History:  Diagnosis Date  . Cancer ([facility])    skin ca  . CHF (congestive heart failure) ([facility])   . Chronic back pain   . COPD (chronic obstructive pulmonary disease) ([facility])   . Diabetes mellitus without complication ([facility])   . Hypertension   . Pacemaker    2015  . Poor perfusion of leg     SOCIAL HX:  Social History   Tobacco Use  . Smoking status: Never Smoker  . Smokeless tobacco: Never Used  Substance Use Topics  . Alcohol use: No    Alcohol/week: 0.0 standard drinks    ALLERGIES:  Allergies  Allergen Reactions  .  Ceftriaxone Swelling    Tolerates piperacillin/tazobactam, PCN G  . Cefuroxime Hives    Tolerates piperacillin/tazobactam, PCN G  . Cephalosporins Swelling and Other (See Comments)    Reaction:  Fainting/dry mouth  Tolerates piperacillin/tazobactam, PCN G  . Codeine Nausea And Vomiting  . Epinephrine Other (See Comments)    Reaction:  Fainting   . Morphine And Related Other (See Comments)    Reaction:  GI upset   . Buprenorphine Hcl Nausea And  Vomiting  . Ciprofloxacin Itching, Swelling and Rash  . Latex Rash  . Lidocaine Rash  . Procaine Rash     PERTINENT MEDICATIONS:  Outpatient Encounter Medications as of 10/23/2018  Medication Sig  . acetaminophen (TYLENOL) 325 MG tablet Take 2 tablets (650 mg total) by mouth every 6 (six) hours as needed for mild pain (or Fever >/= 101).  Marland Kitchen aspirin EC 81 MG tablet Take 81 mg by mouth daily.  . baclofen (LIORESAL) 10 MG tablet Take 10 mg by mouth 3 (three) times daily as needed (pain).   Marland Kitchen buPROPion (WELLBUTRIN XL) 300 MG 24 hr tablet Take 300 mg by mouth daily.   . busPIRone (BUSPAR) 5 MG tablet Take 5 mg by mouth 2 (two) times daily.  . carvedilol (COREG) 6.25 MG tablet Take 6.25 mg by mouth 2 (two) times daily with a meal.   . cholecalciferol (VITAMIN D) 1000 units tablet Take 2,000 Units by mouth daily.   Marland Kitchen docusate sodium (COLACE) 100 MG capsule Take 100 mg by mouth 2 (two) times daily.   Marland Kitchen doxycycline (VIBRA-TABS) 100 MG tablet Take 1 tablet (100 mg total) by mouth every 12 (twelve) hours.  Marland Kitchen escitalopram (LEXAPRO) 10 MG tablet Take 10 mg by mouth daily.  . ferrous sulfate 325 (65 FE) MG tablet Take 325 mg by mouth daily with breakfast.  . fexofenadine (ALLEGRA) 60 MG tablet Take 60 mg by mouth 2 (two) times daily.  . furosemide (LASIX) 40 MG tablet Take 1 tablet (40 mg total) by mouth 2 (two) times daily.  Marland Kitchen gabapentin (NEURONTIN) 100 MG capsule Take 100-200 mg by mouth See admin instructions. Take 1 capsule (100mg ) by mouth every night at bedtime and 1 additional capsule by mouth at bedtime as needed for leg pain  . insulin glargine (LANTUS) 100 UNIT/ML injection Inject 0.2 mLs (20 Units total) into the skin at bedtime.  Marland Kitchen ipratropium-albuterol (DUONEB) 0.5-2.5 (3) MG/3ML SOLN Take 3 mLs by nebulization every 6 (six) hours as needed (wheezing). (Patient taking differently: Take 3 mLs by nebulization 4 (four) times daily as needed (shortness of breath). )  . magnesium oxide (MAG-OX) 400  MG tablet Take 400 mg by mouth daily.  . Melatonin 3 MG TABS Take 3 mg by mouth at bedtime.   . metFORMIN (GLUCOPHAGE-XR) 750 MG 24 hr tablet Take 750 mg by mouth daily.  . metoCLOPramide (REGLAN) 5 MG tablet Take 5 mg by mouth every 6 (six) hours as needed for nausea or vomiting.  . Multiple Vitamin (MULTIVITAMIN) tablet Take 1 tablet by mouth daily.  Marland Kitchen oxyCODONE (OXY IR/ROXICODONE) 5 MG immediate release tablet Take 1 tablet (5 mg total) by mouth every 6 (six) hours as needed for moderate pain or severe pain.  . pantoprazole (PROTONIX) 40 MG tablet Take 40 mg by mouth daily.  . polyethylene glycol (MIRALAX / GLYCOLAX) packet Take 17 g by mouth daily.  Marland Kitchen PROAIR HFA 108 (90 Base) MCG/ACT inhaler Inhale 2 puffs into the lungs every 6 (six) hours as needed  for wheezing or shortness of breath.   . Rivaroxaban (XARELTO) 15 MG TABS tablet Take 15 mg by mouth daily.  Marland Kitchen rOPINIRole (REQUIP) 0.5 MG tablet Take 0.5 mg by mouth at bedtime.   . senna (SENOKOT) 8.6 MG TABS tablet Take 1 tablet (8.6 mg total) by mouth daily.  . sodium phosphate (FLEET) 7-19 GM/118ML ENEM Place 1 enema rectally daily as needed for severe constipation.  Marland Kitchen spironolactone (ALDACTONE) 25 MG tablet Take 0.5 tablets (12.5 mg total) by mouth daily.  . traZODone (DESYREL) 50 MG tablet Take 25 mg by mouth at bedtime.  . vitamin B-12 (CYANOCOBALAMIN) 1000 MCG tablet Take 1,000 mcg by mouth daily.  . vitamin C (ASCORBIC ACID) 500 MG tablet Take 500 mg by mouth daily.  . Vitamin D, Ergocalciferol, (DRISDOL) 50000 UNITS CAPS capsule Take 50,000 Units by mouth every Thursday.    No facility-administered encounter medications on file as of 10/23/2018.     PHYSICAL EXAM/ ROS with pt and staff.  General: NAD, frail appearing, obese, endorses LE pain from ulcer Cardiovascular: denies chest pain Pulmonary: no cough, uses oxygen at hs Abdomen: fair intake, bm constipation GU: no suprapubic tenderness Extremities: 2+ pitting edema bil Skin:  no rashes. Large wound on left LE due to injury Neurological: Weakness , states anxiety, pain.  Cyndia Skeeters DNP, AGPCNP-BC

## 2018-11-04 ENCOUNTER — Other Ambulatory Visit: Payer: Self-pay

## 2018-11-04 ENCOUNTER — Encounter: Payer: Medicare Other | Attending: Internal Medicine | Admitting: Internal Medicine

## 2018-11-04 DIAGNOSIS — I4891 Unspecified atrial fibrillation: Secondary | ICD-10-CM | POA: Diagnosis not present

## 2018-11-04 DIAGNOSIS — I110 Hypertensive heart disease with heart failure: Secondary | ICD-10-CM | POA: Diagnosis not present

## 2018-11-04 DIAGNOSIS — W19XXXD Unspecified fall, subsequent encounter: Secondary | ICD-10-CM | POA: Insufficient documentation

## 2018-11-04 DIAGNOSIS — E1142 Type 2 diabetes mellitus with diabetic polyneuropathy: Secondary | ICD-10-CM | POA: Insufficient documentation

## 2018-11-04 DIAGNOSIS — E11622 Type 2 diabetes mellitus with other skin ulcer: Secondary | ICD-10-CM | POA: Diagnosis not present

## 2018-11-04 DIAGNOSIS — Z950 Presence of cardiac pacemaker: Secondary | ICD-10-CM | POA: Diagnosis not present

## 2018-11-04 DIAGNOSIS — S8011XD Contusion of right lower leg, subsequent encounter: Secondary | ICD-10-CM | POA: Diagnosis not present

## 2018-11-04 DIAGNOSIS — L97528 Non-pressure chronic ulcer of other part of left foot with other specified severity: Secondary | ICD-10-CM | POA: Insufficient documentation

## 2018-11-04 DIAGNOSIS — L97822 Non-pressure chronic ulcer of other part of left lower leg with fat layer exposed: Secondary | ICD-10-CM | POA: Diagnosis present

## 2018-11-04 DIAGNOSIS — E11621 Type 2 diabetes mellitus with foot ulcer: Secondary | ICD-10-CM | POA: Insufficient documentation

## 2018-11-04 DIAGNOSIS — J449 Chronic obstructive pulmonary disease, unspecified: Secondary | ICD-10-CM | POA: Diagnosis not present

## 2018-11-04 DIAGNOSIS — I5030 Unspecified diastolic (congestive) heart failure: Secondary | ICD-10-CM | POA: Insufficient documentation

## 2018-11-04 DIAGNOSIS — Z7901 Long term (current) use of anticoagulants: Secondary | ICD-10-CM | POA: Diagnosis not present

## 2018-11-04 DIAGNOSIS — D649 Anemia, unspecified: Secondary | ICD-10-CM | POA: Insufficient documentation

## 2018-11-04 NOTE — Progress Notes (Signed)
[name], [name] ([id number]) Visit Report for 11/04/2018 Abuse/Suicide Risk Screen Details Patient [name], [name] Date of Service: 11/04/2018 9:15 AM Medical Record Number: [id number] Patient Account Number: [id number] Date of Birth/Sex: July 13, 1924 (85 y.o. F) Treating RN: Harold Barban Primary Care Perlita Forbush: Tracie Harrier Other Clinician: Referring Gabrien Mentink: HOUSECALLS, DOCTORS Treating Shakea Isip/Extender: Tito Dine in Treatment: 0 Abuse/Suicide Risk Screen Items Answer ABUSE RISK SCREEN: Has anyone close to you tried to hurt or harm you recentlyo No Do you feel uncomfortable with anyone in your familyo No Has anyone forced you do things that you didnot want to doo No Electronic Signature(s) Signed: 11/04/2018 4:23:05 PM By: Harold Barban Entered By: Harold Barban on 11/04/2018 09:48:43 [name] ([id number]) -------------------------------------------------------------------------------- Activities of Daily Living Details Patient [name] Date of Service: 11/04/2018 9:15 AM Medical Record Number: [id number] Patient Account Number: [id number] Date of Birth/Sex: 02/15/1925 (85 y.o. F) Treating RN: Harold Barban Primary Care Maurio Baize: Tracie Harrier Other Clinician: Referring Kialee Kham: HOUSECALLS, DOCTORS Treating Kinnedy Mongiello/Extender: Tito Dine in Treatment: 0 Activities of Daily Living Items Answer Activities of Daily Living (Please select one for each item) Drive Automobile Not Able Take Medications Completely Able Use Telephone Completely Able Care for Appearance Need Assistance Use Toilet Need Assistance Bath / Shower Need Assistance Dress Self Need Assistance Feed Self Completely Able Walk Need Assistance Get In / Out Bed Need Assistance Housework Not Able Prepare Meals Not Able Handle Money Need Assistance Shop for Self Not Able Electronic Signature(s) Signed: 11/04/2018 4:23:05 PM By: Harold Barban Entered By: Harold Barban on 11/04/2018 09:49:27 [name] ([id number]) -------------------------------------------------------------------------------- Education Screening Details Patient [name] Date of Service: 11/04/2018 9:15 AM Medical Record Number: [id number] Patient Account Number: [id number] Date of Birth/Sex: 1924/11/23 (85 y.o. F) Treating RN: Harold Barban Primary Care Teniqua Marron: Tracie Harrier Other Clinician: Referring Leiloni Smithers: HOUSECALLS, DOCTORS Treating Latif Nazareno/Extender: Tito Dine in Treatment: 0 Primary Learner Assessed: Patient Learning Preferences/Education Level/Primary Language Learning Preference: Explanation Highest Education Level: College or Above Preferred Language: English Cognitive Barrier Language Barrier: No Memory Deficit: No Emotional Barrier: No Cultural/Religious Beliefs Affecting Medical Care: No Physical Barrier Impaired Vision: No Impaired Hearing: No Decreased Hand dexterity: No Knowledge/Comprehension Knowledge Level: High Comprehension Level: High Ability to understand written High instructions: Ability to understand verbal High instructions: Motivation Anxiety Level: Calm Cooperation: Cooperative Education Importance: Acknowledges Need Interest in Health Problems: Asks Questions Perception: Coherent Willingness to Engage in Self- High Management Activities: Readiness to Engage in Self- High Management Activities: Electronic Signature(s) Signed: 11/04/2018 4:23:05 PM By: Harold Barban Entered By: Harold Barban on 11/04/2018 09:50:14 [name], [name] ([id number]) -------------------------------------------------------------------------------- Fall Risk Assessment Details Patient [name] Date of Service: 11/04/2018 9:15 AM Medical Record Number: [id number] Patient Account Number: [id number] Date of Birth/Sex: 06-19-1925 (85 y.o. F) Treating RN: Harold Barban Primary Care Haillee Johann: Tracie Harrier Other Clinician: Referring Donyell Carrell: HOUSECALLS, DOCTORS Treating Zenon Leaf/Extender: Tito Dine in Treatment: 0 Fall Risk Assessment Items Have you had 2 or more falls in the last 12 monthso 0 Yes Have you had any fall that resulted in injury in the last 12 monthso 0 Yes FALLS RISK SCREEN History of falling - immediate or within 3 months 25 Yes Secondary diagnosis (Do you have 2 or more medical diagnoseso) 0 No Ambulatory aid None/bed rest/wheelchair/nurse 0 No Crutches/cane/walker 15 Yes Furniture 0 No Intravenous therapy Access/Saline/Heparin Lock 0 No Gait/Transferring Normal/ bed rest/ wheelchair 0 No Weak (short steps with  or without shuffle, stooped but able to lift head while 10 Yes walking, may seek support from furniture) Impaired (short steps with shuffle, may have difficulty arising from chair, head 20 Yes down, impaired balance) Mental Status Oriented to own ability 0 No Notes Patient stated she has fallen four times since living at [facility], where she FX her wrist. Electronic Signature(s) Signed: 11/04/2018 4:23:05 PM By: Harold Barban Entered By: Harold Barban on 11/04/2018 09:53:26 [name] ([id number]) -------------------------------------------------------------------------------- Foot Assessment Details Patient [name] Date of Service: 11/04/2018 9:15 AM Medical Record Number: [id number] Patient Account Number: [id number] Date of Birth/Sex: 1925-04-11 (85 y.o. F) Treating RN: Montey Hora Primary Care Corian Handley: Tracie Harrier Other Clinician: Referring Vernell Townley: HOUSECALLS, DOCTORS Treating Lowell Makara/Extender: Tito Dine in Treatment: 0 Foot Assessment Items Site Locations + = Sensation present, - = Sensation absent, C = Callus, U = Ulcer R = Redness, W = Warmth, M = Maceration, PU = Pre-ulcerative lesion F = Fissure, S = Swelling, D =  Dryness Assessment Right: Left: Other Deformity: No No Prior Foot Ulcer: No No Prior Amputation: No No Charcot Joint: No No Ambulatory Status: Non-ambulatory Assistance Device: Wheelchair Gait: Administrator, arts) Signed: 11/04/2018 3:06:33 PM By: Montey Hora Signed: 11/04/2018 4:23:05 PM By: Harold Barban Previous Signature: 11/04/2018 9:50:50 AM Version By: Montey Hora Entered By: Harold Barban on 11/04/2018 09:54:01 [name] ([id number]) -------------------------------------------------------------------------------- Nutrition Risk Screening Details Patient [name] Date of Service: 11/04/2018 9:15 AM Medical Record Number: [id number] Patient Account Number: [id number] Date of Birth/Sex: 09/30/1924 (85 y.o. F) Treating RN: Harold Barban Primary Care Teale Goodgame: Tracie Harrier Other Clinician: Referring Lavette Yankovich: HOUSECALLS, DOCTORS Treating Alson Mcpheeters/Extender: Tito Dine in Treatment: 0 Height (in): 67 Weight (lbs): 175 Body Mass Index (BMI): 27.4 Nutrition Risk Screening Items Score Screening NUTRITION RISK SCREEN: I have an illness or condition that made me change the kind and/or amount of 0 No food I eat I eat fewer than two meals per day 0 No I eat few fruits and vegetables, or milk products 0 No I have three or more drinks of beer, liquor or wine almost every day 0 No I have tooth or mouth problems that make it hard for me to eat 0 No I don't always have enough money to buy the food I need 0 No I eat alone most of the time 1 Yes I take three or more different prescribed or over-the-counter drugs a day 1 Yes Without wanting to, I have lost or gained 10 pounds in the last six months 0 No I am not always physically able to shop, cook and/or feed myself 0 No Nutrition Protocols Good Risk Protocol Moderate Risk Protocol High Risk Proctocol Risk Level: Good Risk Score: 2 Electronic Signature(s) Signed: 11/04/2018  4:23:05 PM By: Harold Barban Entered By: Harold Barban on 11/04/2018 09:53:46

## 2018-11-05 NOTE — Progress Notes (Signed)
ROSSI, SILVESTRO ([id number]) Visit Report for 11/04/2018 Allergy List Details Patient [name], [name] Date of Service: 11/04/2018 9:15 AM Medical Record Number: [id number] Patient Account Number: [id number] Date of Birth/Sex: 07-Nov-1924 (85 y.o. F) Treating RN: Cornell Barman Primary Care Trevin Gartrell: Tracie Harrier Other Clinician: Referring Loryn Haacke: HOUSECALLS, DOCTORS Treating Trygg Mantz/Extender: Tito Dine in Treatment: 0 Allergies Active Allergies penicillin Cephalosporins lidocaine morphine codeine epinephrine cefuroxime procaine buprenorphine ceftriaxone latex Cipro Allergy Notes Electronic Signature(s) Signed: 11/04/2018 9:41:10 AM By: Gretta Cool, BSN, RN, CWS, Kim RN, BSN Entered By: Gretta Cool, BSN, RN, CWS, Kim on 11/04/2018 09:41:09 [name] ([id number]) -------------------------------------------------------------------------------- Arrival Information Details Patient [name] Date of Service: 11/04/2018 9:15 AM Medical Record Number: [id number] Patient Account Number: [id number] Date of Birth/Sex: Nov 04, 1924 (85 y.o. F) Treating RN: Harold Barban Primary Care Danyla Wattley: Tracie Harrier Other Clinician: Referring Mali Eppard: HOUSECALLS, DOCTORS Treating Kade Rickels/Extender: Tito Dine in Treatment: 0 Visit Information Patient Arrived: Wheel Chair Arrival Time: 09:35 Accompanied By: caregiver Transfer Assistance: EasyPivot Patient Lift Patient Identification Verified: Yes Secondary Verification Process Yes Completed: Patient Has Alerts: Yes Patient Alerts: LIDOCAINE ALLERGY!! Electronic Signature(s) Signed: 11/04/2018 9:41:42 AM By: Gretta Cool, BSN, RN, CWS, Kim RN, BSN Entered By: Gretta Cool, BSN, RN, CWS, Kim on 11/04/2018 09:41:42 [name] ([id number]) -------------------------------------------------------------------------------- Clinic Level of Care Assessment Details Patient [name] Date of Service:  11/04/2018 9:15 AM Medical Record Number: [id number] Patient Account Number: [id number] Date of Birth/Sex: 25-Sep-1924 (85 y.o. F) Treating RN: Cornell Barman Primary Care Devoiry Corriher: Tracie Harrier Other Clinician: Referring Anel Creighton: HOUSECALLS, DOCTORS Treating Mckinze Poirier/Extender: Tito Dine in Treatment: 0 Clinic Level of Care Assessment Items TOOL 2 Quantity Score []  - Use when only an EandM is performed on the INITIAL visit 0 ASSESSMENTS - Nursing Assessment / Reassessment X - General Physical Exam (combine w/ comprehensive assessment (listed just below) when 1 20 performed on new pt. evals) X- 1 25 Comprehensive Assessment (HX, ROS, Risk Assessments, Wounds Hx, etc.) ASSESSMENTS - Wound and Skin Assessment / Reassessment []  - Simple Wound Assessment / Reassessment - one wound 0 X- 5 5 Complex Wound Assessment / Reassessment - multiple wounds []  - 0 Dermatologic / Skin Assessment (not related to wound area) ASSESSMENTS - Ostomy and/or Continence Assessment and Care []  - Incontinence Assessment and Management 0 []  - 0 Ostomy Care Assessment and Management (repouching, etc.) PROCESS - Coordination of Care []  - Simple Patient / Family Education for ongoing care 0 X- 1 20 Complex (extensive) Patient / Family Education for ongoing care X- 1 10 Staff obtains Programmer, systems, Records, Test Results / Process Orders []  - 0 Staff telephones HHA, Nursing Homes / Clarify orders / etc []  - 0 Routine Transfer to another Facility (non-emergent condition) []  - 0 Routine Hospital Admission (non-emergent condition) X- 1 15 New Admissions / Biomedical engineer / Ordering NPWT, Apligraf, etc. []  - 0 Emergency Hospital Admission (emergent condition) X- 1 10 Simple Discharge Coordination []  - 0 Complex (extensive) Discharge Coordination PROCESS - Special Needs []  - Pediatric / Minor Patient Management 0 []  - 0 Isolation Patient Management KAYTLYNNE, NEACE. ([id number]) []  -  0 Hearing / Language / Visual special needs []  - 0 Assessment of Community assistance (transportation, D/C planning, etc.) []  - 0 Additional assistance / Altered mentation []  - 0 Support Surface(s) Assessment (bed, cushion, seat, etc.) INTERVENTIONS - Wound Cleansing / Measurement X - Wound Imaging (photographs - any number of wounds) 1 5 []  - 0 Wound Tracing (instead  of photographs) []  - 0 Simple Wound Measurement - one wound X- 5 5 Complex Wound Measurement - multiple wounds []  - 0 Simple Wound Cleansing - one wound X- 5 5 Complex Wound Cleansing - multiple wounds INTERVENTIONS - Wound Dressings []  - Small Wound Dressing one or multiple wounds 0 []  - 0 Medium Wound Dressing one or multiple wounds X- 2 20 Large Wound Dressing one or multiple wounds []  - 0 Application of Medications - injection INTERVENTIONS - Miscellaneous []  - External ear exam 0 []  - 0 Specimen Collection (cultures, biopsies, blood, body fluids, etc.) []  - 0 Specimen(s) / Culture(s) sent or taken to Lab for analysis []  - 0 Patient Transfer (multiple staff / Civil Service fast streamer / Similar devices) []  - 0 Simple Staple / Suture removal (25 or less) []  - 0 Complex Staple / Suture removal (26 or more) []  - 0 Hypo / Hyperglycemic Management (close monitor of Blood Glucose) X- 1 15 Ankle / Brachial Index (ABI) - do not check if billed separately Has the patient been seen at the hospital within the last three years: Yes Total Score: 235 Level Of Care: New/Established - Level 5 Electronic Signature(s) Signed: 11/05/2018 10:03:11 AM By: Gretta Cool, BSN, RN, CWS, Kim RN, BSN Entered By: Gretta Cool, BSN, RN, CWS, Kim on 11/04/2018 13:26:08 [name] ([id number]) -------------------------------------------------------------------------------- Lower Extremity Assessment Details Patient [name] Date of Service: 11/04/2018 9:15 AM Medical Record Number: [id number] Patient Account Number: [id number] Date of  Birth/Sex: 23-May-1925 (85 y.o. F) Treating RN: Harold Barban Primary Care Jerremy Maione: Tracie Harrier Other Clinician: Referring Aneisha Skyles: HOUSECALLS, DOCTORS Treating Keandra Medero/Extender: Tito Dine in Treatment: 0 Edema Assessment Assessed: [Left: No] [Right: No] Edema: [Left: Yes] [Right: Yes] Calf Left: Right: Point of Measurement: 35 cm From Medial Instep 43.5 cm 44.8 cm Ankle Left: Right: Point of Measurement: 12 cm From Medial Instep 20.5 cm 22 cm Vascular Assessment Pulses: Dorsalis Pedis Palpable: [Left:Yes] [Right:Yes] Doppler Audible: [Left:Yes] [Right:Yes] Posterior Tibial Palpable: [Left:Yes] [Right:Yes] Doppler Audible: [Left:Yes] [Right:Yes] Blood Pressure: Brachial: [Left:125] Dorsalis Pedis: 118 [Left:Dorsalis Pedis: 417] Ankle: Posterior Tibial: 180 [Left:Posterior Tibial: 116 1.44] [Right:0.98] Electronic Signature(s) Signed: 11/04/2018 4:23:05 PM By: Harold Barban Entered By: Harold Barban on 11/04/2018 10:01:33 [name] ([id number]) -------------------------------------------------------------------------------- Multi Wound Chart Details Patient [name] Date of Service: 11/04/2018 9:15 AM Medical Record Number: [id number] Patient Account Number: [id number] Date of Birth/Sex: 1925-03-15 (85 y.o. F) Treating RN: Cornell Barman Primary Care Rhylie Stehr: Tracie Harrier Other Clinician: Referring Marguis Mathieson: HOUSECALLS, DOCTORS Treating Elise Knobloch/Extender: Tito Dine in Treatment: 0 Vital Signs Height(in): 42 Pulse(bpm): 75 Weight(lbs): 175 Blood Pressure(mmHg): 125/60 Body Mass Index(BMI): 27 Temperature(F): 97.7 Respiratory Rate 16 (breaths/min): Photos: Wound Location: Left Lower Leg - Lateral, Left Lower Leg - Lateral, Distal Left Knee Proximal Wounding Event: Gradually Appeared Blister Trauma Primary Etiology: Diabetic Wound/Ulcer of the Diabetic Wound/Ulcer of the Diabetic Wound/Ulcer of  the Lower Extremity Lower Extremity Lower Extremity Secondary Etiology: Lymphedema Lymphedema N/A Comorbid History: Anemia, Chronic Obstructive Anemia, Chronic Obstructive Anemia, Chronic Obstructive Pulmonary Disease (COPD), Pulmonary Disease (COPD), Pulmonary Disease (COPD), Arrhythmia, Congestive Heart Arrhythmia, Congestive Heart Arrhythmia, Congestive Heart Failure, Hypertension, Type II Failure, Hypertension, Type II Failure, Hypertension, Type II Diabetes, Neuropathy Diabetes, Neuropathy Diabetes, Neuropathy Date Acquired: 08/12/2018 10/28/2018 10/28/2018 Weeks of Treatment: 0 0 0 Wound Status: Open Open Open Measurements L x W x D 5.8x3.2x0.3 1.5x0.7x0.1 1.1x1.2x0.1 (cm) Area (cm) : 14.577 0.825 1.037 Volume (cm) : 4.373 0.082 0.104 Classification: Grade 2 Grade 1  Grade 1 Exudate Amount: Large Large Small Exudate Type: Serous Serous Serous Exudate Color: amber amber amber Wound Margin: Flat and Intact Flat and Intact Flat and Intact Granulation Amount: None Present (0%) Small (1-33%) Small (1-33%) Granulation Quality: N/A Red Red Necrotic Amount: Large (67-100%) Large (67-100%) Large (67-100%) Necrotic Tissue: Adherent [place] Exposed Structures: Fat Layer (Subcutaneous Fat Layer (Subcutaneous Fat Layer (Subcutaneous Tissue) Exposed: Yes Tissue) Exposed: Yes Tissue) Exposed: Yes ADELINE, PETITFRERE ([id number]) Fascia: No Fascia: No Fascia: No Tendon: No Tendon: No Tendon: No Muscle: No Muscle: No Muscle: No Joint: No Joint: No Joint: No Bone: No Bone: No Bone: No Epithelialization: None None Small (1-33%) Periwound Skin Texture: Excoriation: No Excoriation: No Excoriation: No Induration: No Induration: No Induration: No Callus: No Callus: No Callus: No Crepitus: No Crepitus: No Crepitus: No Rash: No Rash: No Rash: No Scarring: No Scarring: No Scarring: No Periwound Skin Moisture: Maceration: No Maceration:  No Maceration: No Dry/Scaly: No Dry/Scaly: No Dry/Scaly: No Periwound Skin Color: Hemosiderin Staining: Yes Hemosiderin Staining: Yes Atrophie Blanche: No Atrophie Blanche: No Atrophie Blanche: No Cyanosis: No Cyanosis: No Cyanosis: No Ecchymosis: No Ecchymosis: No Ecchymosis: No Erythema: No Erythema: No Erythema: No Hemosiderin Staining: No Mottled: No Mottled: No Mottled: No Pallor: No Pallor: No Pallor: No Rubor: No Rubor: No Rubor: No Temperature: No Abnormality No Abnormality No Abnormality Tenderness on Palpation: Yes Yes Yes Wound Number: 4 5 N/A Photos: N/A Wound Location: Left Toe Great Right Lower Leg - Anterior N/A Wounding Event: Trauma Gradually Appeared N/A Primary Etiology: Diabetic Wound/Ulcer of the Diabetic Wound/Ulcer of the N/A Lower Extremity Lower Extremity Secondary Etiology: N/A Lymphedema N/A Comorbid History: Anemia, Chronic Obstructive Anemia, Chronic Obstructive N/A Pulmonary Disease (COPD), Pulmonary Disease (COPD), Arrhythmia, Congestive Heart Arrhythmia, Congestive Heart Failure, Hypertension, Type II Failure, Hypertension, Type II Diabetes, Neuropathy Diabetes, Neuropathy Date Acquired: 11/03/2018 08/13/2018 N/A Weeks of Treatment: 0 0 N/A Wound Status: Open Open N/A Measurements L x W x D 1.8x2.1x0.1 2x3x0.1 N/A (cm) Area (cm) : 2.969 4.712 N/A Volume (cm) : 0.297 0.471 N/A Classification: Grade 1 Grade 1 N/A Exudate Amount: Large Large N/A Exudate Type: Sanguinous Serous N/A Exudate Color: red amber N/A Wound Margin: Indistinct, nonvisible Flat and Intact N/A Granulation Amount: Large (67-100%) None Present (0%) N/A KENNADY, ZIMMERLE ([id number]) Granulation Quality: Pink N/A N/A Necrotic Amount: None Present (0%) Small (1-33%) N/A Necrotic Tissue: N/A Eschar N/A Exposed Structures: Fascia: No Fascia: No N/A Fat Layer (Subcutaneous Fat Layer (Subcutaneous Tissue) Exposed: No Tissue) Exposed: No Tendon: No Tendon:  No Muscle: No Muscle: No Joint: No Joint: No Bone: No Bone: No Limited to Skin Breakdown Limited to Skin Breakdown Epithelialization: Small (1-33%) Small (1-33%) N/A Periwound Skin Texture: No Abnormalities Noted Excoriation: No N/A Induration: No Callus: No Crepitus: No Rash: No Scarring: No Periwound Skin Moisture: No Abnormalities Noted Maceration: No N/A Dry/Scaly: No Periwound Skin Color: No Abnormalities Noted Hemosiderin Staining: Yes N/A Atrophie Blanche: No Cyanosis: No Ecchymosis: No Erythema: No Mottled: No Pallor: No Rubor: No Temperature: No Abnormality No Abnormality N/A Tenderness on Palpation: Yes Yes N/A Treatment Notes Electronic Signature(s) Signed: 11/04/2018 4:30:12 PM By: Linton Ham MD Entered By: Linton Ham on 11/04/2018 11:05:00 [name] ([id number]) -------------------------------------------------------------------------------- Multi-Disciplinary Care Plan Details Patient [name] Date of Service: 11/04/2018 9:15 AM Medical Record Number: [id number] Patient Account Number: [id number] Date of Birth/Sex: 05/02/1925 (85 y.o. F) Treating RN: Cornell Barman Primary Care Netra Postlethwait: Tracie Harrier Other Clinician: Referring Syriah Delisi:  HOUSECALLS, DOCTORS Treating Knight Oelkers/Extender: Ricard Dillon Weeks in Treatment: 0 Active Inactive Necrotic Tissue Nursing Diagnoses: Impaired tissue integrity related to necrotic/devitalized tissue Knowledge deficit related to management of necrotic/devitalized tissue Goals: Necrotic/devitalized tissue will be minimized in the wound bed Date Initiated: 11/04/2018 Target Resolution Date: 11/11/2018 Goal Status: Active Patient/caregiver will verbalize understanding of reason and process for debridement of necrotic tissue Date Initiated: 11/04/2018 Target Resolution Date: 11/11/2018 Goal Status: Active Interventions: Assess patient pain level pre-, during and post procedure and prior to  discharge Treatment Activities: Apply topical anesthetic as ordered : 11/04/2018 Notes: Orientation to the Wound Care Program Nursing Diagnoses: Knowledge deficit related to the wound healing center program Goals: Patient/caregiver will verbalize understanding of the [facility] Date Initiated: 11/04/2018 Target Resolution Date: 11/11/2018 Goal Status: Active Interventions: Provide education on orientation to the wound center Notes: Venous Leg Ulcer Nursing Diagnoses: Knowledge deficit related to disease process and management SHER, SHAMPINE ([id number]) Goals: Patient will maintain optimal edema control Date Initiated: 11/04/2018 Target Resolution Date: 11/11/2018 Goal Status: Active Interventions: Assess peripheral edema status every visit. Treatment Activities: Therapeutic compression applied : 11/04/2018 Notes: Wound/Skin Impairment Nursing Diagnoses: Impaired tissue integrity Goals: Patient/caregiver will verbalize understanding of skin care regimen Date Initiated: 11/04/2018 Target Resolution Date: 11/11/2018 Goal Status: Active Ulcer/skin breakdown will have a volume reduction of 30% by week 4 Date Initiated: 11/04/2018 Target Resolution Date: 11/25/2018 Goal Status: Active Interventions: Assess ulceration(s) every visit Treatment Activities: Topical wound management initiated : 11/04/2018 Notes: Electronic Signature(s) Signed: 11/05/2018 10:03:11 AM By: Gretta Cool, BSN, RN, CWS, Kim RN, BSN Entered By: Gretta Cool, BSN, RN, CWS, Kim on 11/04/2018 10:11:42 [name] ([id number]) -------------------------------------------------------------------------------- Pain Assessment Details Patient [name] Date of Service: 11/04/2018 9:15 AM Medical Record Number: [id number] Patient Account Number: [id number] Date of Birth/Sex: February 21, 1925 (85 y.o. F) Treating RN: Harold Barban Primary Care Suella Cogar: Tracie Harrier Other Clinician: Referring  Pretty Weltman: HOUSECALLS, DOCTORS Treating Kaitlen Redford/Extender: Tito Dine in Treatment: 0 Active Problems Location of Pain Severity and Description of Pain Patient Has Paino Yes Site Locations Rate the pain. Current Pain Level: 8 Pain Management and Medication Current Pain Management: Electronic Signature(s) Signed: 11/04/2018 4:23:05 PM By: Harold Barban Entered By: Harold Barban on 11/04/2018 09:36:37 [name] ([id number]) -------------------------------------------------------------------------------- Wound Assessment Details Patient [name] Date of Service: 11/04/2018 9:15 AM Medical Record Number: [id number] Patient Account Number: [id number] Date of Birth/Sex: 12-09-1924 (85 y.o. F) Treating RN: Montey Hora Primary Care Paolo Okane: Tracie Harrier Other Clinician: Referring Weber Monnier: HOUSECALLS, DOCTORS Treating Joffrey Kerce/Extender: Tito Dine in Treatment: 0 Wound Status Wound Number: 1 Primary Diabetic Wound/Ulcer of the Lower Extremity Etiology: Wound Location: Left Lower Leg - Lateral, Proximal Secondary Lymphedema Wounding Event: Gradually Appeared Etiology: Date Acquired: 08/12/2018 Wound Open Weeks Of Treatment: 0 Status: Clustered Wound: No Comorbid Anemia, Chronic Obstructive Pulmonary History: Disease (COPD), Arrhythmia, Congestive Heart Failure, Hypertension, Type II Diabetes, Neuropathy Photos Wound Measurements Length: (cm) 5.8 Width: (cm) 3.2 Depth: (cm) 0.3 Area: (cm) 14.577 Volume: (cm) 4.373 % Reduction in Area: % Reduction in Volume: Epithelialization: None Tunneling: No Undermining: No Wound Description Classification: Grade 2 Wound Margin: Flat and Intact Exudate Amount: Large Exudate Type: Serous Exudate Color: amber Foul Odor After Cleansing: No Slough/Fibrino Yes Wound Bed Granulation Amount: None Present (0%) Exposed Structure Necrotic Amount: Large (67-100%) Fascia Exposed:  No Necrotic Quality: Adherent Slough Fat Layer (Subcutaneous Tissue) Exposed: Yes Tendon Exposed: No Muscle Exposed: No Joint Exposed: No Bone Exposed:  No CHYLA, SCHLENDER ([id number]) Periwound Skin Texture Texture Color No Abnormalities Noted: No No Abnormalities Noted: No Callus: No Atrophie Blanche: No Crepitus: No Cyanosis: No Excoriation: No Ecchymosis: No Induration: No Erythema: No Rash: No Hemosiderin Staining: Yes Scarring: No Mottled: No Pallor: No Moisture Rubor: No No Abnormalities Noted: No Dry / Scaly: No Temperature / Pain Maceration: No Temperature: No Abnormality Tenderness on Palpation: Yes Electronic Signature(s) Signed: 11/04/2018 9:52:43 AM By: Montey Hora Entered By: Montey Hora on 11/04/2018 09:52:42 [name] ([id number]) -------------------------------------------------------------------------------- Wound Assessment Details Patient [name] Date of Service: 11/04/2018 9:15 AM Medical Record Number: [id number] Patient Account Number: [id number] Date of Birth/Sex: Jan 22, 1925 (85 y.o. F) Treating RN: Montey Hora Primary Care Shekera Beavers: Tracie Harrier Other Clinician: Referring Margerie Fraiser: HOUSECALLS, DOCTORS Treating Heloise Gordan/Extender: Tito Dine in Treatment: 0 Wound Status Wound Number: 2 Primary Diabetic Wound/Ulcer of the Lower Extremity Etiology: Wound Location: Left Lower Leg - Lateral, Distal Secondary Lymphedema Wounding Event: Blister Etiology: Date Acquired: 10/28/2018 Wound Open Weeks Of Treatment: 0 Status: Clustered Wound: No Comorbid Anemia, Chronic Obstructive Pulmonary History: Disease (COPD), Arrhythmia, Congestive Heart Failure, Hypertension, Type II Diabetes, Neuropathy Photos Wound Measurements Length: (cm) 1.5 Width: (cm) 0.7 Depth: (cm) 0.1 Area: (cm) 0.825 Volume: (cm) 0.082 % Reduction in Area: % Reduction in Volume: Epithelialization: None Tunneling:  No Undermining: No Wound Description Classification: Grade 1 Foul Odor Wound Margin: Flat and Intact Slough/Fi Exudate Amount: Large Exudate Type: Serous Exudate Color: amber After Cleansing: No brino Yes Wound Bed Granulation Amount: Small (1-33%) Exposed Structure Granulation Quality: Red Fascia Exposed: No Necrotic Amount: Large (67-100%) Fat Layer (Subcutaneous Tissue) Exposed: Yes Necrotic Quality: Adherent Slough Tendon Exposed: No Muscle Exposed: No Joint Exposed: No Bone Exposed: No RASHANDA, MAGLOIRE ([id number]) Periwound Skin Texture Texture Color No Abnormalities Noted: No No Abnormalities Noted: No Callus: No Atrophie Blanche: No Crepitus: No Cyanosis: No Excoriation: No Ecchymosis: No Induration: No Erythema: No Rash: No Hemosiderin Staining: Yes Scarring: No Mottled: No Pallor: No Moisture Rubor: No No Abnormalities Noted: No Dry / Scaly: No Temperature / Pain Maceration: No Temperature: No Abnormality Tenderness on Palpation: Yes Electronic Signature(s) Signed: 11/04/2018 9:54:27 AM By: Montey Hora Entered By: Montey Hora on 11/04/2018 09:54:27 [name] ([id number]) -------------------------------------------------------------------------------- Wound Assessment Details Patient [name] Date of Service: 11/04/2018 9:15 AM Medical Record Number: [id number] Patient Account Number: [id number] Date of Birth/Sex: January 06, 1925 (85 y.o. F) Treating RN: Montey Hora Primary Care Lourine Alberico: Tracie Harrier Other Clinician: Referring Reet Scharrer: HOUSECALLS, DOCTORS Treating Zacherie Honeyman/Extender: Tito Dine in Treatment: 0 Wound Status Wound Number: 3 Primary Diabetic Wound/Ulcer of the Lower Extremity Etiology: Wound Location: Left Knee Wound Open Wounding Event: Trauma Status: Date Acquired: 10/28/2018 Comorbid Anemia, Chronic Obstructive Pulmonary Disease Weeks Of Treatment: 0 History: (COPD), Arrhythmia, Congestive  Heart Failure, Clustered Wound: No Hypertension, Type II Diabetes, Neuropathy Photos Wound Measurements Length: (cm) 1.1 % Reduction in Width: (cm) 1.2 % Reduction in Depth: (cm) 0.1 Epithelializat Area: (cm) 1.037 Tunneling: Volume: (cm) 0.104 Undermining: Area: Volume: ion: Small (1-33%) No No Wound Description Classification: Grade 1 Foul Odor Afte Wound Margin: Flat and Intact Slough/Fibrino Exudate Amount: Small Exudate Type: Serous Exudate Color: amber r Cleansing: No Yes Wound Bed Granulation Amount: Small (1-33%) Exposed Structure Granulation Quality: Red Fascia Exposed: No Necrotic Amount: Large (67-100%) Fat Layer (Subcutaneous Tissue) Exposed: Yes Necrotic Quality: Adherent Slough Tendon Exposed: No Muscle Exposed: No Joint Exposed: No Bone Exposed: No Periwound Skin Texture Texture Color Hannay,  Chere F. ([id number]) No Abnormalities Noted: No No Abnormalities Noted: No Callus: No Atrophie Blanche: No Crepitus: No Cyanosis: No Excoriation: No Ecchymosis: No Induration: No Erythema: No Rash: No Hemosiderin Staining: No Scarring: No Mottled: No Pallor: No Moisture Rubor: No No Abnormalities Noted: No Dry / Scaly: No Temperature / Pain Maceration: No Temperature: No Abnormality Tenderness on Palpation: Yes Electronic Signature(s) Signed: 11/04/2018 9:56:35 AM By: Montey Hora Entered By: Montey Hora on 11/04/2018 09:56:34 [name] ([id number]) -------------------------------------------------------------------------------- Wound Assessment Details Patient [name] Date of Service: 11/04/2018 9:15 AM Medical Record Number: [id number] Patient Account Number: [id number] Date of Birth/Sex: 07-24-1924 (85 y.o. F) Treating RN: Montey Hora Primary Care Keiandre Cygan: Tracie Harrier Other Clinician: Referring Olivette Beckmann: HOUSECALLS, DOCTORS Treating Matthews Franks/Extender: Tito Dine in Treatment: 0 Wound  Status Wound Number: 4 Primary Diabetic Wound/Ulcer of the Lower Extremity Etiology: Wound Location: Left Toe Great Wound Open Wounding Event: Trauma Status: Date Acquired: 11/03/2018 Comorbid Anemia, Chronic Obstructive Pulmonary Disease Weeks Of Treatment: 0 History: (COPD), Arrhythmia, Congestive Heart Failure, Clustered Wound: No Hypertension, Type II Diabetes, Neuropathy Photos Wound Measurements Length: (cm) 1.8 % Reduction Width: (cm) 2.1 % Reduction Depth: (cm) 0.1 Epithelializ Area: (cm) 2.969 Tunneling: Volume: (cm) 0.297 Undermining in Area: in Volume: ation: Small (1-33%) No : No Wound Description Classification: Grade 1 Foul Odor Af Wound Margin: Indistinct, nonvisible Slough/Fibri Exudate Amount: Large Exudate Type: Sanguinous Exudate Color: red ter Cleansing: No no No Wound Bed Granulation Amount: Large (67-100%) Exposed Structure Granulation Quality: Pink Fascia Exposed: No Necrotic Amount: None Present (0%) Fat Layer (Subcutaneous Tissue) Exposed: No Tendon Exposed: No Muscle Exposed: No Joint Exposed: No Bone Exposed: No Limited to Skin [address] EMMARAE, COWDERY ([id number]) Texture Color No Abnormalities Noted: No No Abnormalities Noted: No Moisture Temperature / Pain No Abnormalities Noted: No Temperature: No Abnormality Tenderness on Palpation: Yes Electronic Signature(s) Signed: 11/04/2018 9:57:53 AM By: Montey Hora Entered By: Montey Hora on 11/04/2018 09:57:53 [name] ([id number]) -------------------------------------------------------------------------------- Wound Assessment Details Patient [name] Date of Service: 11/04/2018 9:15 AM Medical Record Number: [id number] Patient Account Number: [id number] Date of Birth/Sex: 1924/08/05 (85 y.o. F) Treating RN: Montey Hora Primary Care [place] Stanger: Tracie Harrier Other Clinician: Referring Damya Comley: HOUSECALLS, DOCTORS Treating  Shamar Engelmann/Extender: Tito Dine in Treatment: 0 Wound Status Wound Number: 5 Primary Diabetic Wound/Ulcer of the Lower Extremity Etiology: Wound Location: Right Lower Leg - Anterior Secondary Lymphedema Wounding Event: Gradually Appeared Etiology: Date Acquired: 08/13/2018 Wound Open Weeks Of Treatment: 0 Status: Clustered Wound: No Comorbid Anemia, Chronic Obstructive Pulmonary History: Disease (COPD), Arrhythmia, Congestive Heart Failure, Hypertension, Type II Diabetes, Neuropathy Photos Wound Measurements Length: (cm) 2 % Reduction in Width: (cm) 3 % Reduction in Depth: (cm) 0.1 Epithelializat Area: (cm) 4.712 Tunneling: Volume: (cm) 0.471 Undermining: Area: Volume: ion: Small (1-33%) No No Wound Description Classification: Grade 1 Foul Odor Afte Wound Margin: Flat and Intact Slough/Fibrino Exudate Amount: Large Exudate Type: Serous Exudate Color: amber r Cleansing: No No Wound Bed Granulation Amount: None Present (0%) Exposed Structure Necrotic Amount: Small (1-33%) Fascia Exposed: No Necrotic Quality: Eschar Fat Layer (Subcutaneous Tissue) Exposed: No Tendon Exposed: No Muscle Exposed: No Joint Exposed: No Bone Exposed: No DEAZIA, LAMPI F. ([id number]) Limited to Skin Breakdown Periwound Skin Texture Texture Color No Abnormalities Noted: No No Abnormalities Noted: No Callus: No Atrophie Blanche: No Crepitus: No Cyanosis: No Excoriation: No Ecchymosis: No Induration: No Erythema: No Rash: No Hemosiderin Staining: Yes Scarring: No Mottled: No  Pallor: No Moisture Rubor: No No Abnormalities Noted: No Dry / Scaly: No Temperature / Pain Maceration: No Temperature: No Abnormality Tenderness on Palpation: Yes Electronic Signature(s) Signed: 11/04/2018 9:59:39 AM By: Montey Hora Entered By: Montey Hora on 11/04/2018 09:59:39 [name]  ([id number]) -------------------------------------------------------------------------------- Vitals Details Patient [name] Date of Service: 11/04/2018 9:15 AM Medical Record Number: [id number] Patient Account Number: [id number] Date of Birth/Sex: 11-17-1924 (85 y.o. F) Treating RN: Harold Barban Primary Care Cordella Nyquist: Tracie Harrier Other Clinician: Referring Kailash Hinze: HOUSECALLS, DOCTORS Treating Karmen Altamirano/Extender: Tito Dine in Treatment: 0 Vital Signs Time Taken: 09:36 Temperature (F): 97.7 Height (in): 67 Pulse (bpm): 75 Source: Stated Respiratory Rate (breaths/min): 16 Weight (lbs): 175 Blood Pressure (mmHg): 125/60 Source: Stated Reference Range: 80 - 120 mg / dl Body Mass Index (BMI): 27.4 Electronic Signature(s) Signed: 11/04/2018 4:23:05 PM By: Harold Barban Entered By: Harold Barban on 11/04/2018 09:37:06

## 2018-11-05 NOTE — Progress Notes (Signed)
LILLYN, WIECZOREK ([id number]) Visit Report for 11/04/2018 Chief Complaint Document Details Patient Name: LATONYIA, LOPATA Date of Service: 11/04/2018 9:15 AM Medical Record Number: [id number] Patient Account Number: [id number] Date of Birth/Sex: 1925-04-24 (85 y.o. F) Treating RN: Cornell Barman Primary Care Provider: Tracie Harrier Other Clinician: Referring Provider: HOUSECALLS, DOCTORS Treating Provider/Extender: Tito Dine in Treatment: 0 Information Obtained from: Patient Chief Complaint 11/04/2018; patient is here for review of wounds on her bilateral lower legs Electronic Signature(s) Signed: 11/04/2018 4:30:12 PM By: Linton Ham MD Entered By: Linton Ham on 11/04/2018 11:06:25 [name] ([id number]) -------------------------------------------------------------------------------- HPI Details Patient Name: [name] Date of Service: 11/04/2018 9:15 AM Medical Record Number: [id number] Patient Account Number: [id number] Date of Birth/Sex: 1925/01/25 (85 y.o. F) Treating RN: Cornell Barman Primary Care Provider: Tracie Harrier Other Clinician: Referring Provider: HOUSECALLS, DOCTORS Treating Provider/Extender: Tito Dine in Treatment: 0 History of Present Illness HPI Description: ADMISSION 11/04/2018 This is a 85 year old woman who lives in [facility]. She comes today accompanied by an attendant. She was in hospital from 09/02/2018 through 09/07/2018 at [facility] regional with cellulitis in her left tibial area. There was also a wound here that was sutured. In conversation with her daughter her daughter thinks this started as a hematoma from a fall. In any case the suturing did not hold and she has a fairly marked wound on the anterior medial left leg. She has a smaller wound below it and 2 small contusions/hematoma on the right anterior leg. She has severe bilateral chronic venous insufficiency with hemosiderin deposition  bilaterally. She was not in compression when she arrived in the clinic. The patient has a history of congestive heart failure. She was slightly tachypneic and complaining of shortness of breath. Physical exam suggested heart failure and we called Dr. Wyline Copas ski who had seen her in 2018 at the [place] clinic. They will see her this afternoon at 215. I note she also sees cardiology in [place] at [facility] for following up of her pacemaker. Past medical history; congestive heart failure, COPD, history of low back pain status post surgery, atrial fibrillation with a pacemaker, on Xarelto and aspirin, history of falls, type 2 diabetes with peripheral neuropathy. She is in a wheelchair plus or minus a walker. ABIs in our clinic were 0.98 on the right and 1.41 on the left. In 2017 she had formal arterial studies showing an ABI at 1.32 on the right 1.27 on the left reduced TBI's at 0.24 at the right and 0.38 on the left. Waveforms were monophasic and biphasic on the right and biphasic bilaterally on the left. She was not felt to have significant arterial disease at that time Electronic Signature(s) Signed: 11/04/2018 4:30:12 PM By: Linton Ham MD Entered By: Linton Ham on 11/04/2018 11:09:50 [name] ([id number]) -------------------------------------------------------------------------------- Physical Exam Details Patient Name: [name] Date of Service: 11/04/2018 9:15 AM Medical Record Number: [id number] Patient Account Number: [id number] Date of Birth/Sex: 1925/03/19 (85 y.o. F) Treating RN: Cornell Barman Primary Care Provider: Tracie Harrier Other Clinician: Referring Provider: HOUSECALLS, DOCTORS Treating Provider/Extender: Ricard Dillon Weeks in Treatment: 0 Constitutional Sitting or standing Blood Pressure is within target range for patient.. Pulse regular and within target range for patient.Marland Kitchen Respirations regular, non-labored and within target range.. Patient was  somewhat tachypneic and short of breath. Eyes Conjunctivae clear. No discharge. Respiratory Above normal respiratory effort noted. Respiratory rate elveaated. Bibasilar crackles and bronchial breathing suggestive of bilateral pleural effusions. Cardiovascular A.  fib no murmurs no S3 jugular venous pressure was elevated to the angle of the jaw. 2-3+ sacral pitting edema. Palpable femoral but faint also at the popliteal. Pedal pulses were also somewhat reduced. Edema present in both extremities. This was actually pitting versus nonpitting lymphedema. Severe anterior hemosiderin deposition with very fragile skin in the bilateral lower extremities. Gastrointestinal (GI) Abdomen is soft and non-distended without masses or tenderness. Bowel sounds active in all quadrants.. Genitourinary (GU) Bladder not distended.. Lymphatic None palpable in the popliteal and inguinal area. Integumentary (Hair, Skin) Hemosiderin deposition anteriorly bilaterally very fragile and pitting. Neurological Reduced sensation to light touch. Psychiatric No evidence of depression, anxiety, or agitation. Calm, cooperative, and communicative. Appropriate interactions and affect.. Notes Wound exam; oThe patient's major wound is on the left anterior lateral tibia area superiorly. Necrotic debris washed off with saline and gauze as best I could. There is no evidence of infection. Small wound below this also anteriorly. She has a nail evulsion on the left first toenail and a superficial abrasion over the left patella. oOn the right in the mid tibial area 2 small hematomas oBilaterally there is severe damage to the lower extremity skin likely related to chronic venous insufficiency with some degree of secondary lymphedema however the amount of pitting in her legs was also suggestive of systemic fluid volume overload Electronic Signature(s) Signed: 11/04/2018 4:30:12 PM By: Linton Ham MD Entered By: Linton Ham on  11/04/2018 11:15:00 [name] ([id number]) -------------------------------------------------------------------------------- Physician Orders Details Patient Name: [name] Date of Service: 11/04/2018 9:15 AM Medical Record Number: [id number] Patient Account Number: [id number] Date of Birth/Sex: 05-09-1925 (85 y.o. F) Treating RN: Cornell Barman Primary Care Provider: Tracie Harrier Other Clinician: Referring Provider: HOUSECALLS, DOCTORS Treating Provider/Extender: Tito Dine in Treatment: 0 Verbal / Phone Orders: No Diagnosis Coding Wound Cleansing Wound #1 Left,Proximal,Lateral Lower Leg o Clean wound with Normal Saline. Wound #2 Left,Distal,Lateral Lower Leg o Clean wound with Normal Saline. Wound #3 Left Knee o Clean wound with Normal Saline. Wound #4 Left Toe Great o Clean wound with Normal Saline. Wound #5 Right,Anterior Lower Leg o Clean wound with Normal Saline. Primary Wound Dressing Wound #1 Left,Proximal,Lateral Lower Leg o Silver Alginate Wound #2 Left,Distal,Lateral Lower Leg o Silver Alginate Wound #3 Left Knee o Other: - triple antibiotics Wound #4 Left Toe Great o Other: - triple antibiotics Wound #5 Right,Anterior Lower Leg o Silver Alginate Secondary Dressing Wound #1 Left,Proximal,Lateral Lower Leg o Kerlix and Coban Wound #2 Left,Distal,Lateral Lower Leg o Kerlix and Coban Wound #3 Left Knee o Other - Coverlet Wound #4 Left Toe Great o Other - Coverlet [place], [place] F. ([id number]) Wound #5 Right,Anterior Lower Leg o Kerlix and Coban Dressing Change Frequency Wound #1 Left,Proximal,Lateral Lower Leg o Change Dressing Monday, Wednesday, Friday Wound #2 Left,Distal,Lateral Lower Leg o Change Dressing Monday, Wednesday, Friday Wound #3 Left Knee o Change Dressing Monday, Wednesday, Friday Wound #5 Right,Anterior Lower Leg o Change Dressing Monday, Wednesday, Friday Follow-up  Appointments Wound #1 Left,Proximal,Lateral Lower Leg o Return Appointment in 1 week. Wound #2 Left,Distal,Lateral Lower Leg o Return Appointment in 1 week. Wound #3 Left Knee o Return Appointment in 1 week. Wound #4 Left Toe Great o Return Appointment in 1 week. Wound #5 Right,Anterior Lower Leg o Return Appointment in 1 week. Edema Control Wound #1 Left,Proximal,Lateral Lower Leg o Elevate legs to the level of the heart and pump ankles as often as possible Wound #2 Left,Distal,Lateral Lower Leg o Elevate legs to  the level of the heart and pump ankles as often as possible Wound #5 Right,Anterior Lower Leg o Elevate legs to the level of the heart and pump ankles as often as possible Additional Orders / Instructions Wound #1 Left,Proximal,Lateral Lower Leg o Other: - Appointment made for patient while in clinic to see Dr. Nehemiah Massed, her cardiologist today at 2:15pm to address excess fluid (CHF). Caregiver states she can get her to the appointment. Wound #2 Left,Distal,Lateral Lower Leg o Other: - Appointment made for patient while in clinic to see Dr. Nehemiah Massed, her cardiologist today at 2:15pm to address excess fluid (CHF). Caregiver states she can get her to the appointment. Wound #3 Left Knee o Other: - Appointment made for patient while in clinic to see Dr. Nehemiah Massed, her cardiologist today at 2:15pm to address excess fluid (CHF). Caregiver states she can get her to the appointment. TAVIE, HASEMAN [place] ([id number]) Wound #4 Left Toe Great o Other: - Appointment made for patient while in clinic to see Dr. Nehemiah Massed, her cardiologist today at 2:15pm to address excess fluid (CHF). Caregiver states she can get her to the appointment. Wound #5 Right,Anterior Lower Leg o Other: - Appointment made for patient while in clinic to see Dr. Nehemiah Massed, her cardiologist today at 2:15pm to address excess fluid (CHF). Caregiver states she can get her to the appointment. Home  Health Wound #1 Left,Proximal,Lateral Lower Leg o [place] Visits - [place] Nurse may visit PRN to address patientos wound care needs. o FACE TO FACE ENCOUNTER: MEDICARE and MEDICAID PATIENTS: I certify that this patient is under my care and that I had a face-to-face encounter that meets the physician face-to-face encounter requirements with this patient on this date. The encounter with the patient was in whole or in part for the following MEDICAL CONDITION: (primary reason for [place]) MEDICAL NECESSITY: I certify, that based on my findings, NURSING services are a medically necessary home health service. HOME BOUND STATUS: I certify that my clinical findings support that this patient is homebound (i.e., Due to illness or injury, pt requires aid of supportive devices such as crutches, cane, wheelchairs, walkers, the use of special transportation or the assistance of another person to leave their place of residence. There is a normal inability to leave the home and doing so requires considerable and taxing effort. Other absences are for medical reasons / religious services and are infrequent or of short duration when for other reasons). o If current dressing causes regression in wound condition, may D/C ordered dressing product/s and apply Normal Saline Moist Dressing daily until next [place] / Other MD appointment. [place] of regression in wound condition at [phone]. o Please direct any NON-WOUND related issues/requests for orders to patient's Primary Care Physician Wound #2 Left,Distal,Lateral Lower Leg o [place] Visits - [place] Nurse may visit PRN to address patientos wound care needs. o FACE TO FACE ENCOUNTER: MEDICARE and MEDICAID PATIENTS: I certify that this patient is under my care and that I had a face-to-face encounter that meets  the physician face-to-face encounter requirements with this patient on this date. The encounter with the patient was in whole or in part for the following MEDICAL CONDITION: (primary reason for [place]) MEDICAL NECESSITY: I certify, that based on my findings, NURSING services are a medically necessary home health service. HOME BOUND STATUS: I certify that my clinical findings support that this patient is homebound (i.e.,  Due to illness or injury, pt requires aid of supportive devices such as crutches, cane, wheelchairs, walkers, the use of special transportation or the assistance of another person to leave their place of residence. There is a normal inability to leave the home and doing so requires considerable and taxing effort. Other absences are for medical reasons / religious services and are infrequent or of short duration when for other reasons). o If current dressing causes regression in wound condition, may D/C ordered dressing product/s and apply Normal Saline Moist Dressing daily until next [place] / Other MD appointment. [place] of regression in wound condition at [phone]. o Please direct any NON-WOUND related issues/requests for orders to patient's Primary Care Physician Wound #3 Left Knee o [place] Nurse may visit PRN to address patientos wound care needs. o FACE TO FACE ENCOUNTER: MEDICARE and MEDICAID PATIENTS: I certify that this patient is under my care and that I had a face-to-face encounter that meets the physician face-to-face encounter requirements with this patient on this date. The encounter with the patient was in whole or in part for the following MEDICAL CONDITION: (primary reason for [place]) MEDICAL NECESSITY: I certify, that based on my findings, NURSING services are a medically necessary home health service. HOME BOUND STATUS: I certify that  my clinical findings support that this patient is homebound (i.e., Due to illness or injury, pt requires aid of supportive devices such as crutches, cane, wheelchairs, walkers, the use of special transportation or the assistance of another person to leave their place of residence. There is a normal inability to leave the home [place], [place] ([id number]) and doing so requires considerable and taxing effort. Other absences are for medical reasons / religious services and are infrequent or of short duration when for other reasons). o If current dressing causes regression in wound condition, may D/C ordered dressing product/s and apply Normal Saline Moist Dressing daily until next [place] / Other MD appointment. [place] of regression in wound condition at [phone]. o Please direct any NON-WOUND related issues/requests for orders to patient's Primary Care Physician Wound #4 Left Toe [place] Visits - [place] Nurse may visit PRN to address patientos wound care needs. o FACE TO FACE ENCOUNTER: MEDICARE and MEDICAID PATIENTS: I certify that this patient is under my care and that I had a face-to-face encounter that meets the physician face-to-face encounter requirements with this patient on this date. The encounter with the patient was in whole or in part for the following MEDICAL CONDITION: (primary reason for [place]) MEDICAL NECESSITY: I certify, that based on my findings, NURSING services are a medically necessary home health service. HOME BOUND STATUS: I certify that my clinical findings support that this patient is homebound (i.e., Due to illness or injury, pt requires aid of supportive devices such as crutches, cane, wheelchairs, walkers, the use of special transportation or the assistance of another person to leave their place of residence. There is a normal inability to leave the home and  doing so requires considerable and taxing effort. Other absences are for medical reasons / religious services and are infrequent or of short duration when for other reasons). o If current dressing causes regression in wound condition, may D/C ordered dressing product/s and apply Normal Saline Moist Dressing daily until next [place] / Other MD appointment. [facility] of regression in wound condition at [phone]. o Please direct any NON-WOUND related issues/requests for orders to patient's Primary Care Physician Wound #5 [place] Nurse may visit PRN to address patientos wound care needs. o FACE TO FACE ENCOUNTER: MEDICARE and MEDICAID PATIENTS: I certify that this patient is under my care and that I had a face-to-face encounter that meets the physician face-to-face encounter requirements with this patient on this date. The encounter with the patient was in whole or in part for the following MEDICAL CONDITION: (primary reason for [place]) MEDICAL NECESSITY: I certify, that based on my findings, NURSING services are a medically necessary home health service. HOME BOUND STATUS: I certify that my clinical findings support that this patient is homebound (i.e., Due to illness or injury, pt requires aid of supportive devices such as crutches, cane, wheelchairs, walkers, the use of special transportation or the assistance of another person to leave their place of residence. There is a normal inability to leave the home and doing so requires considerable and taxing effort. Other absences are for medical reasons / religious services and are infrequent or of short duration when for other reasons). o If current dressing causes regression in wound condition, may D/C ordered dressing product/s and apply Normal Saline Moist Dressing daily until next [place] /  Other MD appointment. [place] of regression in wound condition at [phone]. o Please direct any NON-WOUND related issues/requests for orders to patient's Primary Care Physician Electronic Signature(s) Signed: 11/04/2018 4:30:12 PM By: Linton Ham MD Signed: 11/05/2018 10:03:11 AM By: Gretta Cool, BSN, RN, CWS, Kim RN, BSN Entered By: Gretta Cool, BSN, RN, CWS, Kim on 11/04/2018 11:20:25 [name] ([id number]) -------------------------------------------------------------------------------- Problem List Details Patient Name: [name] Date of Service: 11/04/2018 9:15 AM Medical Record Number: [id number] Patient Account Number: [id number] Date of Birth/Sex: Oct 25, 1924 (85 y.o. F) Treating RN: Cornell Barman Primary Care Provider: Tracie Harrier Other Clinician: Referring Provider: HOUSECALLS, DOCTORS Treating Provider/Extender: Tito Dine in Treatment: 0 Active Problems ICD-10 Evaluated Encounter Code Description Active Date Today Diagnosis L97.822 Non-pressure chronic ulcer of other part of left lower leg with 11/04/2018 No Yes fat layer exposed L97.528 Non-pressure chronic ulcer of other part of left foot with other 11/04/2018 No Yes specified severity S80.11XD Contusion of right lower leg, subsequent encounter 11/04/2018 No Yes I50.30 Unspecified diastolic (congestive) heart failure 11/04/2018 No Yes I87.323 Chronic venous hypertension (idiopathic) with inflammation of 11/04/2018 No Yes bilateral lower extremity I89.0 Lymphedema, not elsewhere classified 11/04/2018 No Yes Inactive Problems Resolved Problems Electronic Signature(s) Signed: 11/04/2018 4:30:12 PM By: Linton Ham MD Entered By: Linton Ham on 11/04/2018 11:19:43 [name] ([id number]) -------------------------------------------------------------------------------- Progress Note Details Patient Name: [name] Date of Service: 11/04/2018 9:15 AM Medical Record Number:  [id number] Patient Account Number: [id number] Date of Birth/Sex: August 10, 1924 (85 y.o. F) Treating RN: Cornell Barman Primary Care Provider: Tracie Harrier Other Clinician: Referring Provider: HOUSECALLS, DOCTORS Treating Provider/Extender: Tito Dine in Treatment: 0 Subjective Chief Complaint Information obtained from Patient 11/04/2018; patient is here for review of wounds on her bilateral lower legs History of Present Illness (HPI) ADMISSION 11/04/2018 This is a 85 year old woman who lives in [facility]. She comes today accompanied by an attendant. She was in hospital from 09/02/2018 through 09/07/2018 at [facility] regional with cellulitis in her left tibial area. There was also a wound here that was  sutured. In conversation with her daughter her daughter thinks this started as a hematoma from a fall. In any case the suturing did not hold and she has a fairly marked wound on the anterior medial left leg. She has a smaller wound below it and 2 small contusions/hematoma on the right anterior leg. She has severe bilateral chronic venous insufficiency with hemosiderin deposition bilaterally. She was not in compression when she arrived in the clinic. The patient has a history of congestive heart failure. She was slightly tachypneic and complaining of shortness of breath. Physical exam suggested heart failure and we called Dr. Wyline Copas ski who had seen her in 2018 at the [place] clinic. They will see her this afternoon at 215. I note she also sees cardiology in [place] at [facility] for following up of her pacemaker. Past medical history; congestive heart failure, COPD, history of low back pain status post surgery, atrial fibrillation with a pacemaker, on Xarelto and aspirin, history of falls, type 2 diabetes with peripheral neuropathy. She is in a wheelchair plus or minus a walker. ABIs in our clinic were 0.98 on the right and 1.41 on the left. In 2017 she had formal  arterial studies showing an ABI at 1.32 on the right 1.27 on the left reduced TBI's at 0.24 at the right and 0.38 on the left. Waveforms were monophasic and biphasic on the right and biphasic bilaterally on the left. She was not felt to have significant arterial disease at that time Patient History Information obtained from Patient. Allergies penicillin, Cephalosporins, lidocaine, morphine, codeine, epinephrine, cefuroxime, procaine, buprenorphine, ceftriaxone, latex, Cipro Family History Diabetes - Siblings, Heart Disease - Mother,Father, No family history of Cancer, Hereditary Spherocytosis, Hypertension, Kidney Disease, Lung Disease, Seizures, Stroke, Thyroid Problems, Tuberculosis. Social History Never smoker, Marital Status - Married, Alcohol Use - Never, Drug Use - No History, Caffeine Use - Daily - coffee/ soda. Medical History Eyes AAYANA, REINERTSEN ([id number]) Denies history of Cataracts, Glaucoma, Optic Neuritis Ear/Nose/Mouth/Throat Denies history of Chronic sinus problems/congestion, Middle ear problems Hematologic/Lymphatic Patient has history of Anemia Denies history of Hemophilia, Human Immunodeficiency Virus, Lymphedema, Sickle Cell Disease Respiratory Patient has history of Chronic Obstructive Pulmonary Disease (COPD) Denies history of Aspiration, Asthma, Pneumothorax, Sleep Apnea, Tuberculosis Cardiovascular Patient has history of Arrhythmia - A Fib, Congestive Heart Failure, Hypertension Denies history of Angina, Coronary Artery Disease, Deep Vein Thrombosis, Hypotension, Myocardial Infarction, Peripheral Arterial Disease, Peripheral Venous Disease, Phlebitis, Vasculitis Gastrointestinal Denies history of Cirrhosis , Colitis, Crohn s, Hepatitis A, Hepatitis B, Hepatitis C Endocrine Patient has history of Type II Diabetes Denies history of Type I Diabetes Genitourinary Denies history of End Stage Renal Disease Immunological Denies history of Lupus Erythematosus,  Raynaud s, Scleroderma Integumentary (Skin) Denies history of History of Burn, History of pressure wounds Musculoskeletal Denies history of Gout, Rheumatoid Arthritis, Osteoarthritis, Osteomyelitis Neurologic Patient has history of Neuropathy Denies history of Dementia, Quadriplegia, Paraplegia, Seizure Disorder Oncologic Denies history of Received Chemotherapy, Received Radiation Psychiatric Denies history of Anorexia/bulimia, Confinement Anxiety Patient is treated with Oral Agents. Blood sugar is tested. Blood sugar results noted at the following times: Breakfast - 149. Review of Systems (ROS) Constitutional Symptoms (General Health) Denies complaints or symptoms of Fatigue, Fever, Chills, Marked Weight Change. Eyes Denies complaints or symptoms of Dry Eyes, Vision Changes, Glasses / Contacts. Ear/Nose/Mouth/Throat Denies complaints or symptoms of Difficult clearing ears, Sinusitis. Hematologic/Lymphatic Denies complaints or symptoms of Bleeding / Clotting Disorders, Human Immunodeficiency Virus. Respiratory Denies complaints or symptoms of Chronic or frequent  coughs, Shortness of Breath. Cardiovascular Denies complaints or symptoms of Chest pain, LE edema. Gastrointestinal Denies complaints or symptoms of Frequent diarrhea, Nausea, Vomiting, Constipation, GERD Endocrine Denies complaints or symptoms of Hepatitis, Thyroid disease, Polydypsia (Excessive Thirst), Hypomagnesium, Hypokalemia Genitourinary Denies complaints or symptoms of Kidney failure/ Dialysis, Incontinence/dribbling. Immunological Denies complaints or symptoms of Hives, Itching. Integumentary (Skin) Complains or has symptoms of Wounds - 5, Swelling. Musculoskeletal CHRISTEL, BAI ([id number]) Complains or has symptoms of Muscle Weakness - Generalized. Denies complaints or symptoms of Muscle Pain. Neurologic Denies complaints or symptoms of Numbness/parasthesias, Focal/Weakness. Psychiatric Denies complaints  or symptoms of Anxiety, Claustrophobia, Major depressive disorder Objective Constitutional Sitting or standing Blood Pressure is within target range for patient.. Pulse regular and within target range for patient.Marland Kitchen Respirations regular, non-labored and within target range.. Patient was somewhat tachypneic and short of breath. Vitals Time Taken: 9:36 AM, Height: 67 in, Source: Stated, Weight: 175 lbs, Source: Stated, BMI: 27.4, Temperature: 97.7 F, Pulse: 75 bpm, Respiratory Rate: 16 breaths/min, Blood Pressure: 125/60 mmHg. Eyes Conjunctivae clear. No discharge. Respiratory Above normal respiratory effort noted. Respiratory rate elveaated. Bibasilar crackles and bronchial breathing suggestive of bilateral pleural effusions. Cardiovascular A. fib no murmurs no S3 jugular venous pressure was elevated to the angle of the jaw. 2-3+ sacral pitting edema. Palpable femoral but faint also at the popliteal. Pedal pulses were also somewhat reduced. Edema present in both extremities. This was actually pitting versus nonpitting lymphedema. Severe anterior hemosiderin deposition with very fragile skin in the bilateral lower extremities. Gastrointestinal (GI) Abdomen is soft and non-distended without masses or tenderness. Bowel sounds active in all quadrants.. Genitourinary (GU) Bladder not distended.. Lymphatic None palpable in the popliteal and inguinal area. Neurological Reduced sensation to light touch. Psychiatric No evidence of depression, anxiety, or agitation. Calm, cooperative, and communicative. Appropriate interactions and affect.. General Notes: Wound exam; The patient's major wound is on the left anterior lateral tibia area superiorly. Necrotic debris washed off with saline and gauze as best I could. There is no evidence of infection. Small wound below this also anteriorly. She has a nail evulsion on the left first toenail and a superficial abrasion over the left patella. On the right  in the mid tibial area 2 small hematomas Bilaterally there is severe damage to the lower extremity skin likely related to chronic venous insufficiency with some degree of secondary lymphedema however the amount of pitting in her legs was also suggestive of systemic fluid volume overload MARUA, QIN F. ([id number]) Integumentary (Hair, Skin) Hemosiderin deposition anteriorly bilaterally very fragile and pitting. Wound #1 status is Open. Original cause of wound was Gradually Appeared. The wound is located on the Left,Proximal,Lateral Lower Leg. The wound measures 5.8cm length x 3.2cm width x 0.3cm depth; 14.577cm^2 area and 4.373cm^3 volume. There is Fat Layer (Subcutaneous Tissue) Exposed exposed. There is no tunneling or undermining noted. There is a large amount of serous drainage noted. The wound margin is flat and intact. There is no granulation within the wound bed. There is a large (67-100%) amount of necrotic tissue within the wound bed including Adherent Slough. The periwound skin appearance exhibited: Hemosiderin Staining. The periwound skin appearance did not exhibit: Callus, Crepitus, Excoriation, Induration, Rash, Scarring, Dry/Scaly, Maceration, Atrophie Blanche, Cyanosis, Ecchymosis, Mottled, Pallor, Rubor, Erythema. Periwound temperature was noted as No Abnormality. The periwound has tenderness on palpation. Wound #2 status is Open. Original cause of wound was Blister. The wound is located on the Left,Distal,Lateral Lower Leg. The wound measures 1.5cm length x  0.7cm width x 0.1cm depth; 0.825cm^2 area and 0.082cm^3 volume. There is Fat Layer (Subcutaneous Tissue) Exposed exposed. There is no tunneling or undermining noted. There is a large amount of serous drainage noted. The wound margin is flat and intact. There is small (1-33%) red granulation within the wound bed. There is a large (67-100%) amount of necrotic tissue within the wound bed including Adherent Slough. The periwound  skin appearance exhibited: Hemosiderin Staining. The periwound skin appearance did not exhibit: Callus, Crepitus, Excoriation, Induration, Rash, Scarring, Dry/Scaly, Maceration, Atrophie Blanche, Cyanosis, Ecchymosis, Mottled, Pallor, Rubor, Erythema. Periwound temperature was noted as No Abnormality. The periwound has tenderness on palpation. Wound #3 status is Open. Original cause of wound was Trauma. The wound is located on the Left Knee. The wound measures 1.1cm length x 1.2cm width x 0.1cm depth; 1.037cm^2 area and 0.104cm^3 volume. There is Fat Layer (Subcutaneous Tissue) Exposed exposed. There is no tunneling or undermining noted. There is a small amount of serous drainage noted. The wound margin is flat and intact. There is small (1-33%) red granulation within the wound bed. There is a large (67-100%) amount of necrotic tissue within the wound bed including Adherent Slough. The periwound skin appearance did not exhibit: Callus, Crepitus, Excoriation, Induration, Rash, Scarring, Dry/Scaly, Maceration, Atrophie Blanche, Cyanosis, Ecchymosis, Hemosiderin Staining, Mottled, Pallor, Rubor, Erythema. Periwound temperature was noted as No Abnormality. The periwound has tenderness on palpation. Wound #4 status is Open. Original cause of wound was Trauma. The wound is located on the Left Toe Great. The wound measures 1.8cm length x 2.1cm width x 0.1cm depth; 2.969cm^2 area and 0.297cm^3 volume. The wound is limited to skin breakdown. There is no tunneling or undermining noted. There is a large amount of sanguinous drainage noted. The wound margin is indistinct and nonvisible. There is large (67-100%) pink granulation within the wound bed. There is no necrotic tissue within the wound bed. Periwound temperature was noted as No Abnormality. The periwound has tenderness on palpation. Wound #5 status is Open. Original cause of wound was Gradually Appeared. The wound is located on the  Right,Anterior Lower Leg. The wound measures 2cm length x 3cm width x 0.1cm depth; 4.712cm^2 area and 0.471cm^3 volume. The wound is limited to skin breakdown. There is no tunneling or undermining noted. There is a large amount of serous drainage noted. The wound margin is flat and intact. There is no granulation within the wound bed. There is a small (1-33%) amount of necrotic tissue within the wound bed including Eschar. The periwound skin appearance exhibited: Hemosiderin Staining. The periwound skin appearance did not exhibit: Callus, Crepitus, Excoriation, Induration, Rash, Scarring, Dry/Scaly, Maceration, Atrophie Blanche, Cyanosis, Ecchymosis, Mottled, Pallor, Rubor, Erythema. Periwound temperature was noted as No Abnormality. The periwound has tenderness on palpation. 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 ([id number]) Chronic venous hypertension (idiopathic) with inflammation of bilateral lower extremity Lymphedema, not elsewhere classified Diagnoses ICD-10 L97.822: Non-pressure chronic ulcer of other part of left lower leg with fat layer exposed L97.528: Non-pressure chronic ulcer of other part of left foot with other specified severity S80.11XD: Contusion of right lower leg, subsequent encounter I50.30: Unspecified diastolic (congestive) heart failure Plan Wound Cleansing: Wound #1 Left,Proximal,Lateral Lower Leg: Clean wound with Normal Saline. Wound #2 Left,Distal,Lateral Lower Leg: Clean wound with  Normal Saline. Wound #3 Left Knee: Clean wound with Normal Saline. Wound #4 Left Toe Great: Clean wound with Normal Saline. Wound #5 Right,Anterior Lower Leg: Clean wound with Normal Saline. Primary  Wound Dressing: Wound #1 Left,Proximal,Lateral Lower Leg: Silver Alginate Wound #2 Left,Distal,Lateral Lower Leg: Silver Alginate Wound #3 Left Knee: Other: - triple antibiotics Wound #4 Left Toe Great: Other: - triple antibiotics Wound #5 Right,Anterior Lower Leg: Silver Alginate Secondary Dressing: Wound #1 Left,Proximal,Lateral Lower Leg: Kerlix and Coban Wound #2 Left,Distal,Lateral Lower Leg: Kerlix and Coban Wound #3 Left Knee: Other - Coverlet Wound #4 Left Toe Great: Other - Coverlet Wound #5 Right,Anterior Lower Leg: Kerlix and Coban Dressing Change Frequency: Wound #1 Left,Proximal,Lateral Lower Leg: Change Dressing Monday, Wednesday, Friday Wound #2 Left,Distal,Lateral Lower Leg: Change Dressing Monday, Wednesday, Friday Wound #3 Left Knee: Change Dressing Monday, Wednesday, Friday Wound #5 Right,Anterior Lower Leg: Change Dressing Monday, Wednesday, Friday JEVON, LITTLEPAGE. ([id number]) Follow-up Appointments: Wound #1 Left,Proximal,Lateral Lower Leg: Return Appointment in 1 week. Wound #2 Left,Distal,Lateral Lower Leg: Return Appointment in 1 week. Wound #3 Left Knee: Return Appointment in 1 week. Wound #4 Left Toe Great: Return Appointment in 1 week. Wound #5 Right,Anterior Lower Leg: Return Appointment in 1 week. Edema Control: Wound #1 Left,Proximal,Lateral Lower Leg: Elevate legs to the level of the heart and pump ankles as often as possible Wound #2 Left,Distal,Lateral Lower Leg: Elevate legs to the level of the heart and pump ankles as often as possible Wound #5 Right,Anterior Lower Leg: Elevate legs to the level of the heart and pump ankles as often as possible Home Health: Wound #1 Left,Proximal,Lateral Lower Leg: [place] Nurse may visit PRN to address patient s wound care needs. FACE TO FACE ENCOUNTER: MEDICARE and MEDICAID PATIENTS: I certify that this patient is under my care and  that I had a face-to-face encounter that meets the physician face-to-face encounter requirements with this patient on this date. The encounter with the patient was in whole or in part for the following MEDICAL CONDITION: (primary reason for [place]) MEDICAL NECESSITY: I certify, that based on my findings, NURSING services are a medically necessary home health service. HOME BOUND STATUS: I certify that my clinical findings support that this patient is homebound (i.e., Due to illness or injury, pt requires aid of supportive devices such as crutches, cane, wheelchairs, walkers, the use of special transportation or the assistance of another person to leave their place of residence. There is a normal inability to leave the home and doing so requires considerable and taxing effort. Other absences are for medical reasons / religious services and are infrequent or of short duration when for other reasons). If current dressing causes regression in wound condition, may D/C ordered dressing product/s and apply Normal Saline Moist Dressing daily until next [place] / Other MD appointment. [place] of regression in wound condition at [phone]. Please direct any NON-WOUND related issues/requests for orders to patient's Primary Care Physician Wound #2 Left,Distal,Lateral Lower Leg: [place] Nurse may visit PRN to address patient s wound care needs. FACE TO FACE ENCOUNTER: MEDICARE and MEDICAID PATIENTS: I certify that this patient is under my care and that I had a face-to-face encounter that meets the physician face-to-face encounter requirements with this patient on this date. The encounter with the patient was in whole or in part for the following MEDICAL CONDITION: (primary reason for  Home Healthcare) MEDICAL NECESSITY: I certify, that based on my findings, NURSING services are a medically necessary  home health service. HOME BOUND STATUS: I certify that my clinical findings support that this patient is homebound (i.e., Due to illness or injury, pt requires aid of supportive devices such as crutches, cane, wheelchairs, walkers, the use of special transportation or the assistance of another person to leave their place of residence. There is a normal inability to leave the home and doing so requires considerable and taxing effort. Other absences are for medical reasons / religious services and are infrequent or of short duration when for other reasons). If current dressing causes regression in wound condition, may D/C ordered dressing product/s and apply Normal Saline Moist Dressing daily until next [place] / Other MD appointment. [place] of regression in wound condition at [phone]. Please direct any NON-WOUND related issues/requests for orders to patient's Primary Care Physician Wound #3 Left Knee: [place] Nurse may visit PRN to address patient s wound care needs. FACE TO FACE ENCOUNTER: MEDICARE and MEDICAID PATIENTS: I certify that this patient is under my care and that I had a face-to-face encounter that meets the physician face-to-face encounter requirements with this patient on this date. The encounter with the patient was in whole or in part for the following MEDICAL CONDITION: (primary reason for [place]) MEDICAL NECESSITY: I certify, that based on my findings, NURSING services are a medically necessary home health service. HOME BOUND STATUS: I certify that my clinical findings support that this patient is homebound (i.e., Due to illness or injury, pt requires aid of supportive devices such as crutches, cane, wheelchairs, walkers, the use of special CATHERN, TAHIR. ([id number]) transportation or the assistance of another person to leave their place of residence. There is a normal  inability to leave the home and doing so requires considerable and taxing effort. Other absences are for medical reasons / religious services and are infrequent or of short duration when for other reasons). If current dressing causes regression in wound condition, may D/C ordered dressing product/s and apply Normal Saline Moist Dressing daily until next [place] / Other MD appointment. [place] of regression in wound condition at [phone]. Please direct any NON-WOUND related issues/requests for orders to patient's Primary Care Physician Wound #4 Left Toe Great: [place] Nurse may visit PRN to address patient s wound care needs. FACE TO FACE ENCOUNTER: MEDICARE and MEDICAID PATIENTS: I certify that this patient is under my care and that I had a face-to-face encounter that meets the physician face-to-face encounter requirements with this patient on this date. The encounter with the patient was in whole or in part for the following MEDICAL CONDITION: (primary reason for [place]) MEDICAL NECESSITY: I certify, that based on my findings, NURSING services are a medically necessary home health service. HOME BOUND STATUS: I certify that my clinical findings support that this patient is homebound (i.e., Due to illness or injury, pt requires aid of supportive devices such as crutches, cane, wheelchairs, walkers, the use of special transportation or the assistance of another person to leave their place of residence. There is a normal inability to leave the home and doing so requires considerable and taxing effort. Other absences are for medical reasons / religious services and are infrequent or of short duration when for other reasons). If current dressing causes  regression in wound condition, may D/C ordered dressing product/s and apply Normal Saline Moist Dressing daily until next [place] /  Other MD appointment. [place] of regression in wound condition at [phone]. Please direct any NON-WOUND related issues/requests for orders to patient's Primary Care Physician Wound #5 Right,Anterior Lower Leg: [place] Nurse may visit PRN to address patient s wound care needs. FACE TO FACE ENCOUNTER: MEDICARE and MEDICAID PATIENTS: I certify that this patient is under my care and that I had a face-to-face encounter that meets the physician face-to-face encounter requirements with this patient on this date. The encounter with the patient was in whole or in part for the following MEDICAL CONDITION: (primary reason for [place]) MEDICAL NECESSITY: I certify, that based on my findings, NURSING services are a medically necessary home health service. HOME BOUND STATUS: I certify that my clinical findings support that this patient is homebound (i.e., Due to illness or injury, pt requires aid of supportive devices such as crutches, cane, wheelchairs, walkers, the use of special transportation or the assistance of another person to leave their place of residence. There is a normal inability to leave the home and doing so requires considerable and taxing effort. Other absences are for medical reasons / religious services and are infrequent or of short duration when for other reasons). If current dressing causes regression in wound condition, may D/C ordered dressing product/s and apply Normal Saline Moist Dressing daily until next [place] / Other MD appointment. [place] of regression in wound condition at [phone]. Please direct any NON-WOUND related issues/requests for orders to patient's Primary Care Physician 1. Severe bilateral lower extremity chronic venous insufficiency with secondary lymphedema. 2. The patient has a history of falling and has contusions on her bilateral  lower legs. The major wound is on the left anterior tibial area 3. This wound on the left needs to be debrided however the patient has intolerance to lidocaine and benzocaine. 4. We applied silver alginate to all open areas and put her in Kerlix Coban compression. She has home health 5. Going forward the patient is going to need stockings when the open areas heal 6. She has 2 small contusion/hematoma on the right anterior tibia I think this area needs to be wrapped as well. These are in danger of opening 7. Spoke to Dr. Alveria Apley office who is her cardiologist we got her an appointment this afternoon for management of fluid volume overload Electronic Signature(s) Signed: 11/04/2018 11:20:14 AM By: Linton Ham MD Entered By: Linton Ham on 11/04/2018 11:20:13 GRISELDA, BRAMBLETT ([id number]) CAMAY, PEDIGO ([id number]) -------------------------------------------------------------------------------- ROS/PFSH Details Patient Name: [name] Date of Service: 11/04/2018 9:15 AM Medical Record Number: [id number] Patient Account Number: [id number] Date of Birth/Sex: 01/21/1925 (85 y.o. F) Treating RN: Harold Barban Primary Care Provider: Tracie Harrier Other Clinician: Referring Provider: HOUSECALLS, DOCTORS Treating Provider/Extender: Tito Dine in Treatment: 0 Information Obtained From Patient Constitutional Symptoms (General Health) Complaints and Symptoms: Negative for: Fatigue; Fever; Chills; Marked Weight Change Eyes Complaints and Symptoms: Negative for: Dry Eyes; Vision Changes; Glasses / Contacts Medical History: Negative for: Cataracts; Glaucoma; Optic Neuritis Ear/Nose/Mouth/Throat Complaints and Symptoms: Negative for: Difficult clearing ears; Sinusitis Medical History: Negative for: Chronic sinus problems/congestion; Middle ear problems Hematologic/Lymphatic Complaints and Symptoms: Negative for: Bleeding / Clotting Disorders; Human  Immunodeficiency Virus Medical History: Positive for: Anemia Negative for: Hemophilia; Human Immunodeficiency Virus; Lymphedema; Sickle Cell  Disease Respiratory Complaints and Symptoms: Negative for: Chronic or frequent coughs; Shortness of Breath Medical History: Positive for: Chronic Obstructive Pulmonary Disease (COPD) Negative for: Aspiration; Asthma; Pneumothorax; Sleep Apnea; Tuberculosis Cardiovascular Complaints and Symptoms: Negative for: Chest pain; LE edema Medical History: Positive for: Arrhythmia - A Fib; Congestive Heart Failure; Hypertension Negative for: Angina; Coronary Artery Disease; Deep Vein Thrombosis; Hypotension; Myocardial Infarction; Peripheral LESA, VANDALL. ([id number]) Arterial Disease; Peripheral Venous Disease; Phlebitis; Vasculitis Gastrointestinal Complaints and Symptoms: Negative for: Frequent diarrhea; Nausea; Vomiting Review of System Notes: Constipation, GERD Medical History: Negative for: Cirrhosis ; Colitis; Crohnos; Hepatitis A; Hepatitis B; Hepatitis C Endocrine Complaints and Symptoms: Negative for: Hepatitis; Thyroid disease; Polydypsia (Excessive Thirst) Review of System Notes: Hypomagnesium, Hypokalemia Medical History: Positive for: Type II Diabetes Negative for: Type I Diabetes Time with diabetes: 2 years Treated with: Oral agents Blood sugar tested every day: Yes Tested : 3 times per day Blood sugar testing results: Breakfast: 149 Genitourinary Complaints and Symptoms: Negative for: Kidney failure/ Dialysis; Incontinence/dribbling Medical History: Negative for: End Stage Renal Disease Immunological Complaints and Symptoms: Negative for: Hives; Itching Medical History: Negative for: Lupus Erythematosus; Raynaudos; Scleroderma Integumentary (Skin) Complaints and Symptoms: Positive for: Wounds - 5; Swelling Medical History: Negative for: History of Burn; History of pressure wounds Musculoskeletal Complaints and  Symptoms: Positive for: Muscle Weakness - Generalized Negative for: Muscle Pain Medical History: AMARIONNA, ARCA ([id number]) Negative for: Gout; Rheumatoid Arthritis; Osteoarthritis; Osteomyelitis Neurologic Complaints and Symptoms: Negative for: Numbness/parasthesias; Focal/Weakness Medical History: Positive for: Neuropathy Negative for: Dementia; Quadriplegia; Paraplegia; Seizure Disorder Psychiatric Complaints and Symptoms: Negative for: Anxiety; Claustrophobia Review of System Notes: Major depressive disorder Medical History: Negative for: Anorexia/bulimia; Confinement Anxiety Oncologic Medical History: Negative for: Received Chemotherapy; Received Radiation Immunizations Pneumococcal Vaccine: Received Pneumococcal Vaccination: Yes Implantable Devices None Family and Social History Cancer: No; Diabetes: Yes - Siblings; Heart Disease: Yes - Mother,Father; Hereditary Spherocytosis: No; Hypertension: No; Kidney Disease: No; Lung Disease: No; Seizures: No; Stroke: No; Thyroid Problems: No; Tuberculosis: No; Never smoker; Marital Status - Married; Alcohol Use: Never; Drug Use: No History; Caffeine Use: Daily - coffee/ soda; Financial Concerns: No; Food, Clothing or Shelter Needs: No; Support System Lacking: No; Transportation Concerns: No Electronic Signature(s) Signed: 11/04/2018 4:23:05 PM By: Harold Barban Signed: 11/04/2018 4:30:12 PM By: Linton Ham MD Entered By: Harold Barban on 11/04/2018 09:47:44 HONESTEE, REVARD ([id number]) -------------------------------------------------------------------------------- SuperBill Details Patient Name: [name] Date of Service: 11/04/2018 Medical Record Number: [id number] Patient Account Number: [id number] Date of Birth/Sex: [birth year] (85 y.o. F) Treating RN: Cornell Barman Primary Care Provider: Tracie Harrier Other Clinician: Referring Provider: HOUSECALLS, DOCTORS Treating Provider/Extender: Tito Dine in  Treatment: 0 Diagnosis Coding ICD-10 Codes Code Description [phone] Non-pressure chronic ulcer of other part of left lower leg with fat layer exposed L97.528 Non-pressure chronic ulcer of other part of left foot with other specified severity S80.11XD Contusion of right lower leg, subsequent encounter I50.30 Unspecified diastolic (congestive) heart failure Facility Procedures CPT4 Code: [id number] Description: 34193 - WOUND CARE VISIT-LEV 5 EST PT Modifier: Quantity: 1 Physician Procedures CPT4 Code Description: [id number] 73532 - WC PHYS LEVEL 4 - NEW PT ICD-10 Diagnosis Description L97.822 Non-pressure chronic ulcer of other part of left lower leg with L97.528 Non-pressure chronic ulcer of other part of left foot with other S80.11XD  Contusion of right lower leg, subsequent encounter I50.30 Unspecified diastolic (congestive) heart failure Modifier: fat layer exposed specified severi Quantity: 1 ty Electronic Signature(s) Signed: 11/04/2018 1:26:23 PM By: Gretta Cool, BSN,  RN, CWS, Leisure centre manager, BSN Signed: 11/04/2018 4:30:12 PM By: Linton Ham MD Entered By: Gretta Cool, BSN, RN, CWS, Kim on 11/04/2018 93:23:55

## 2018-11-11 ENCOUNTER — Other Ambulatory Visit: Payer: Self-pay

## 2018-11-11 ENCOUNTER — Encounter: Payer: Medicare Other | Admitting: Internal Medicine

## 2018-11-11 DIAGNOSIS — S8011XD Contusion of right lower leg, subsequent encounter: Secondary | ICD-10-CM | POA: Diagnosis not present

## 2018-11-11 DIAGNOSIS — I110 Hypertensive heart disease with heart failure: Secondary | ICD-10-CM | POA: Diagnosis not present

## 2018-11-11 DIAGNOSIS — R0602 Shortness of breath: Secondary | ICD-10-CM | POA: Diagnosis not present

## 2018-11-11 NOTE — Progress Notes (Addendum)
[name], [name] ([id number]) Visit Report for 11/11/2018 Debridement Details Patient [name], [name] Date of Service: 11/11/2018 3:30 PM Medical Record Number: [id number] Patient Account Number: [id number] Date of Birth/Sex: 1925-04-10 (85 y.o. F) Treating RN: Cornell Barman Primary Care Provider: Tracie Harrier Other Clinician: Referring Provider: Tracie Harrier Treating Provider/Extender: Beverly Gust in Treatment: 1 Debridement Performed for Wound #1 Left,Proximal,Lateral Lower Leg Assessment: Performed By: Physician Tobi Bastos, MD Debridement Type: Debridement Severity of Tissue Pre Fat layer exposed Debridement: Level of Consciousness (Pre- Awake and Alert procedure): Pre-procedure Verification/Time Yes - 16:00 Out Taken: Start Time: 16:00 Pain Control: Other : Pain ease spray Total Area Debrided (L x W): 6 (cm) x 3 (cm) = 18 (cm) Tissue and other material Viable, Non-Viable, Slough, Subcutaneous, Slough debrided: Level: Skin/Subcutaneous Tissue Debridement Description: Excisional Instrument: Curette Bleeding: Minimum Hemostasis Achieved: Pressure End Time: 16:05 Response to Treatment: Procedure was tolerated well Level of Consciousness Awake and Alert (Post-procedure): Post Debridement Measurements of Total Wound Length: (cm) 6 Width: (cm) 3 Depth: (cm) 0.3 Volume: (cm) 4.241 Character of Wound/Ulcer Post Debridement: Stable Severity of Tissue Post Debridement: Fat layer exposed Post Procedure Diagnosis Same as Pre-procedure Electronic Signature(s) Signed: 11/11/2018 4:15:20 PM By: Gretta Cool, BSN, RN, CWS, Kim RN, BSN Signed: 11/11/2018 4:16:13 PM By: Tobi Bastos Entered By: Gretta Cool, BSN, RN, CWS, Kim on 11/11/2018 16:15:19 [name] ([id number]) -------------------------------------------------------------------------------- Debridement Details Patient [name] Date of Service: 11/11/2018 3:30 PM Medical Record Number:  [id number] Patient Account Number: [id number] Date of Birth/Sex: 07-02-1924 (85 y.o. F) Treating RN: Cornell Barman Primary Care Provider: Tracie Harrier Other Clinician: Referring Provider: Tracie Harrier Treating Provider/Extender: Beverly Gust in Treatment: 1 Debridement Performed for Wound #2 Left,Distal,Lateral Lower Leg Assessment: Performed By: Physician Tobi Bastos, MD Debridement Type: Debridement Severity of Tissue Pre Fat layer exposed Debridement: Level of Consciousness (Pre- Awake and Alert procedure): Pre-procedure Verification/Time Yes - 16:00 Out Taken: Start Time: 16:00 Pain Control: Other : Pain ease spray Total Area Debrided (L x W): 3 (cm) x 0.6 (cm) = 1.8 (cm) Tissue and other material Viable, Non-Viable, Slough, Subcutaneous, Slough debrided: Level: Skin/Subcutaneous Tissue Debridement Description: Excisional Instrument: Curette Bleeding: Minimum Hemostasis Achieved: Pressure End Time: 16:05 Response to Treatment: Procedure was tolerated well Level of Consciousness Awake and Alert (Post-procedure): Post Debridement Measurements of Total Wound Length: (cm) 3 Width: (cm) 0.6 Depth: (cm) 0.3 Volume: (cm) 0.424 Character of Wound/Ulcer Post Debridement: Stable Severity of Tissue Post Debridement: Fat layer exposed Post Procedure Diagnosis Same as Pre-procedure Electronic Signature(s) Signed: 11/11/2018 4:16:03 PM By: Gretta Cool, BSN, RN, CWS, Kim RN, BSN Signed: 11/11/2018 4:16:13 PM By: Tobi Bastos Entered By: Gretta Cool BSN, RN, CWS, Kim on 11/11/2018 16:16:03 [name] ([id number]) -------------------------------------------------------------------------------- HPI Details Patient [name] Date of Service: 11/11/2018 3:30 PM Medical Record Number: [id number] Patient Account Number: [id number] Date of Birth/Sex: 02/07/1925 (85 y.o. F) Treating RN: Cornell Barman Primary Care Provider: Tracie Harrier Other  Clinician: Referring Provider: Tracie Harrier Treating Provider/Extender: Beverly Gust in Treatment: 1 History of Present Illness HPI Description: ADMISSION 11/04/2018 This is a 85 year old woman who lives in [facility]. She comes today accompanied by an attendant. She was in hospital from 09/02/2018 through 09/07/2018 at [facility] regional with cellulitis in her left tibial area. There was also a wound here that was sutured. In conversation with her daughter her daughter thinks this started as a hematoma from a fall. In any case the suturing did not hold and  she has a fairly marked wound on the anterior medial left leg. She has a smaller wound below it and 2 small contusions/hematoma on the right anterior leg. She has severe bilateral chronic venous insufficiency with hemosiderin deposition bilaterally. She was not in compression when she arrived in the clinic. The patient has a history of congestive heart failure. She was slightly tachypneic and complaining of shortness of breath. Physical exam suggested heart failure and we called Dr. Wyline Copas ski who had seen her in 2018 at the [place] clinic. They will see her this afternoon at 215. I note she also sees cardiology in [place] at [facility] for following up of her pacemaker. Past medical history; congestive heart failure, COPD, history of low back pain status post surgery, atrial fibrillation with a pacemaker, on Xarelto and aspirin, history of falls, type 2 diabetes with peripheral neuropathy. She is in a wheelchair plus or minus a walker. ABIs in our clinic were 0.98 on the right and 1.41 on the left. In 2017 she had formal arterial studies showing an ABI at 1.32 on the right 1.27 on the left reduced TBI's at 0.24 at the right and 0.38 on the left. Waveforms were monophasic and biphasic on the right and biphasic bilaterally on the left. She was not felt to have significant arterial disease at that time 5/20-Patient  returns to clinic, she was seen by cardiology last week after being at the wound clinic due to concerns about congestive heart failure symptoms, patient had Lasix increased 260 mg total dose daily and since then apparently no changes other than probably increased swelling in the legs. The right leg is weeping the hematomas on the lower anterior shin appear at risk of being up into wounds The left lower extremity distal and proximal wounds with significant degree of slough Electronic Signature(s) Signed: 11/11/2018 4:11:13 PM By: Tobi Bastos Entered By: Tobi Bastos on 11/11/2018 16:11:12 [name] ([id number]) -------------------------------------------------------------------------------- Physical Exam Details Patient [name] Date of Service: 11/11/2018 3:30 PM Medical Record Number: [id number] Patient Account Number: [id number] Date of Birth/Sex: Apr 25, 1925 (85 y.o. F) Treating RN: Cornell Barman Primary Care Provider: Tracie Harrier Other Clinician: Referring Provider: Tracie Harrier Treating Provider/Extender: Beverly Gust in Treatment: 1 Constitutional alert and oriented x 3. sitting or standing blood pressure is within target range for patient.. supine blood pressure is within target range for patient.. pulse regular and within target range for patient.Marland Kitchen respirations regular, non-labored and within target range for patient.Marland Kitchen temperature within target range for patient.. . . Well-nourished and well-hydrated in no acute distress. Notes Left leg distal wound with significant degree of slough that was debrided after cold spray, some of the slough was easily removed but the layer below was more adherent and fibrinous. Some degree of bleeding was observed that stopped Left leg lateral wound also on recent hematoma is small and had layer of skin flap that was removed and this has a slightly bigger ulcerated margin now Left great toe without the nail appears  to have a clean base Left knee hematoma is small Right anterior leg hematoma with 2 areas that appear ready to break down There is a lot of weeping from the right leg Electronic Signature(s) Signed: 11/11/2018 4:14:14 PM By: Tobi Bastos Entered By: Tobi Bastos on 11/11/2018 16:14:14 [name] ([id number]) -------------------------------------------------------------------------------- Physician Orders Details Patient [name] Date of Service: 11/11/2018 3:30 PM Medical Record Number: [id number] Patient Account Number: [id number] Date of Birth/Sex: 1924/12/31 (85 y.o. F)  Treating RN: Cornell Barman Primary Care Provider: Tracie Harrier Other Clinician: Referring Provider: Tracie Harrier Treating Provider/Extender: Beverly Gust in Treatment: 1 Verbal / Phone Orders: No Diagnosis Coding Wound Cleansing Wound #1 Left,Proximal,Lateral Lower Leg o Cleanse wound with mild soap and water Wound #2 Left,Distal,Lateral Lower Leg o Cleanse wound with mild soap and water Wound #3 Left Knee o Cleanse wound with mild soap and water Wound #4 Left Toe Great o Cleanse wound with mild soap and water Wound #5 Right,Anterior Lower Leg o Cleanse wound with mild soap and water Anesthetic (add to Medication List) Wound #1 Left,Proximal,Lateral Lower Leg o PainEase-Instant Topical anesthetic Wound #2 Left,Distal,Lateral Lower Leg o PainEase-Instant Topical anesthetic Primary Wound Dressing Wound #1 Left,Proximal,Lateral Lower Leg o Silver Alginate Wound #2 Left,Distal,Lateral Lower Leg o Silver Alginate Wound #5 Right,Anterior Lower Leg o Silver Alginate Wound #3 Left Knee o Other: - triple antibiotic Wound #4 Left Toe Great o Other: - triple antibiotic Secondary Dressing Wound #1 Left,Proximal,Lateral Lower Leg o ABD and Kerlix/Conform Wound #2 Left,Distal,Lateral Lower Leg o ABD and Kerlix/Conform Wound #5 Right,Anterior Lower  Leg o ABD and Kerlix/Conform Wound #3 Left Knee o Other - coverlet Wound #4 Left Toe [place], [place] F. ([id number]) o Other - coverlet Dressing Change Frequency Wound #1 Left,Proximal,Lateral Lower Leg o Change Dressing Monday, Wednesday, Friday Wound #2 Left,Distal,Lateral Lower Leg o Change Dressing Monday, Wednesday, Friday Wound #3 Left Knee o Change Dressing Monday, Wednesday, Friday Wound #4 Left Toe Great o Change Dressing Monday, Wednesday, Friday Wound #5 Right,Anterior Lower Leg o Change Dressing Monday, Wednesday, Friday Follow-up Appointments Wound #1 Left,Proximal,Lateral Lower Leg o Return Appointment in 1 week. Wound #2 Left,Distal,Lateral Lower Leg o Return Appointment in 1 week. Wound #3 Left Knee o Return Appointment in 1 week. Wound #4 Left Toe Great o Return Appointment in 1 week. Wound #5 Right,Anterior Lower Leg o Return Appointment in 1 week. Edema Control Wound #1 Left,Proximal,Lateral Lower Leg o Elevate legs to the level of the heart and pump ankles as often as possible o Other: - Tubigrip F bilaterally Wound #2 Left,Distal,Lateral Lower Leg o Elevate legs to the level of the heart and pump ankles as often as possible o Other: - Tubigrip F bilaterally Wound #5 Right,Anterior Lower Leg o Elevate legs to the level of the heart and pump ankles as often as possible o Other: - Tubigrip F bilaterally Home Health Wound #1 Left,Proximal,Lateral Lower Leg o [place] Visits o Home Health Nurse may visit PRN to address patientos wound care needs. o FACE TO FACE ENCOUNTER: MEDICARE and MEDICAID PATIENTS: I certify that this patient is under my care and that I had a face-to-face encounter that meets the physician face-to-face encounter requirements with this patient on this date. The encounter with the patient was in whole or in part for the following MEDICAL CONDITION: (primary reason for [place]) MEDICAL NECESSITY: I certify, that based on my findings, NURSING services are a medically necessary home health service. HOME BOUND STATUS: I certify that my clinical findings support that this patient is homebound (i.e., Due to illness or injury, pt requires aid of supportive devices such as crutches, cane, wheelchairs, walkers, the use of special transportation or the assistance of another person to leave their place of residence. There is a normal inability to leave the home and doing so requires considerable and taxing effort. Other absences are for medical reasons / religious services and are infrequent or of short duration when for  other reasons). o If current dressing causes regression in wound condition, may D/C ordered dressing product/s and apply Normal Saline Moist Dressing daily until next [place] / Other MD appointment. [name]. [name], [name] ([id number]) o Please direct any NON-WOUND related issues/requests for orders to patient's Primary Care Physician Wound #2 Left,Distal,Lateral Lower Leg o [place] Visits o Home Health Nurse may visit PRN to address patientos wound care needs. o FACE TO FACE ENCOUNTER: MEDICARE and MEDICAID PATIENTS: I certify that this patient is under my care and that I had a face-to-face encounter that meets the physician face-to-face encounter requirements with this patient on this date. The encounter with the patient was in whole or in part for the following MEDICAL CONDITION: (primary reason for [place]) MEDICAL NECESSITY: I certify, that based on my findings, NURSING services are a medically necessary home health service. HOME BOUND STATUS: I certify that my clinical findings support that this patient is homebound (i.e., Due to illness or injury, pt requires aid of supportive devices such as crutches, cane, wheelchairs, walkers, the  use of special transportation or the assistance of another person to leave their place of residence. There is a normal inability to leave the home and doing so requires considerable and taxing effort. Other absences are for medical reasons / religious services and are infrequent or of short duration when for other reasons). o If current dressing causes regression in wound condition, may D/C ordered dressing product/s and apply Normal Saline Moist Dressing daily until next [place] / Other MD appointment. [place] of regression in wound condition at [phone]. o Please direct any NON-WOUND related issues/requests for orders to patient's Primary Care Physician Wound #3 Left Knee o [place] Nurse may visit PRN to address patientos wound care needs. o FACE TO FACE ENCOUNTER: MEDICARE and MEDICAID PATIENTS: I certify that this patient is under my care and that I had a face-to-face encounter that meets the physician face-to-face encounter requirements with this patient on this date. The encounter with the patient was in whole or in part for the following MEDICAL CONDITION: (primary reason for [place]) MEDICAL NECESSITY: I certify, that based on my findings, NURSING services are a medically necessary home health service. HOME BOUND STATUS: I certify that my clinical findings support that this patient is homebound (i.e., Due to illness or injury, pt requires aid of supportive devices such as crutches, cane, wheelchairs, walkers, the use of special transportation or the assistance of another person to leave their place of residence. There is a normal inability to leave the home and doing so requires considerable and taxing effort. Other absences are for medical reasons / religious services and are infrequent or of short duration when for other reasons). o If current dressing causes regression in wound condition, may  D/C ordered dressing product/s and apply Normal Saline Moist Dressing daily until next [place] / Other MD appointment. [place] of regression in wound condition at [phone]. o Please direct any NON-WOUND related issues/requests for orders to patient's Primary Care Physician Wound #4 Left Toe [place] Nurse may visit PRN to address patientos wound care needs. o FACE TO FACE ENCOUNTER: MEDICARE and MEDICAID PATIENTS: I certify that this patient is under my care and that I had a face-to-face encounter that meets the physician face-to-face encounter  requirements with this patient on this date. The encounter with the patient was in whole or in part for the following MEDICAL CONDITION: (primary reason for [place]) MEDICAL NECESSITY: I certify, that based on my findings, NURSING services are a medically necessary home health service. HOME BOUND STATUS: I certify that my clinical findings support that this patient is homebound (i.e., Due to illness or injury, pt requires aid of supportive devices such as crutches, cane, wheelchairs, walkers, the use of special transportation or the assistance of another person to leave their place of residence. There is a normal inability to leave the home and doing so requires considerable and taxing effort. Other absences are for medical reasons / religious services and are infrequent or of short duration when for other reasons). o If current dressing causes regression in wound condition, may D/C ordered dressing product/s and apply Normal Saline Moist Dressing daily until next [place] / Other MD appointment. [place] of regression in wound condition at [phone]. o Please direct any NON-WOUND related issues/requests for orders to patient's Primary Care Physician Wound #5 [facility] Nurse may visit PRN to address patientos wound care needs. o FACE TO FACE ENCOUNTER: MEDICARE and MEDICAID PATIENTS: I certify that this patient is under my care and that I had a face-to-face encounter that meets the physician face-to-face encounter requirements with this patient on this date. The encounter with the patient was in whole or in part for the following MEDICAL CONDITION: (primary reason for [place]) MEDICAL NECESSITY: I certify, that based on my findings, NURSING services are a medically necessary home health service. HOME BOUND STATUS: I certify that my clinical findings support that this patient is homebound (i.e., Due to illness or injury, pt requires aid of supportive devices such as crutches, cane, wheelchairs, walkers, the use of special transportation or the assistance of another person to leave their place of residence. There is a normal inability to leave the home [place], [place] ([id number]) and doing so requires considerable and taxing effort. Other absences are for medical reasons / religious services and are infrequent or of short duration when for other reasons). o If current dressing causes regression in wound condition, may D/C ordered dressing product/s and apply Normal Saline Moist Dressing daily until next [place] / Other MD appointment. [place] of regression in wound condition at [phone]. o Please direct any NON-WOUND related issues/requests for orders to patient's Primary Care Physician Electronic Signature(s) Signed: 11/11/2018 4:18:42 PM By: Gretta Cool, BSN, RN, CWS, Kim RN, BSN Entered By: Gretta Cool, BSN, RN, CWS, Kim on 11/11/2018 16:18:42 [name] ([id number]) -------------------------------------------------------------------------------- Progress Note Details Patient [name] Date of Service: 11/11/2018 3:30 PM Medical Record Number: [id number] Patient Account Number: [id number] Date of  Birth/Sex: February 13, 1925 (85 y.o. F) Treating RN: Cornell Barman Primary Care Provider: Tracie Harrier Other Clinician: Referring Provider: Tracie Harrier Treating Provider/Extender: Beverly Gust in Treatment: 1 Subjective History of Present Illness (HPI) ADMISSION 11/04/2018 This is a 85 year old woman who lives in [facility]. She comes today accompanied by an attendant. She was in hospital from 09/02/2018 through 09/07/2018 at [facility] regional with cellulitis in her left tibial area. There was also a wound here that was sutured. In conversation with her daughter her daughter thinks this started as a hematoma from a fall. In any case the suturing did not hold and she has a fairly  marked wound on the anterior medial left leg. She has a smaller wound below it and 2 small contusions/hematoma on the right anterior leg. She has severe bilateral chronic venous insufficiency with hemosiderin deposition bilaterally. She was not in compression when she arrived in the clinic. The patient has a history of congestive heart failure. She was slightly tachypneic and complaining of shortness of breath. Physical exam suggested heart failure and we called Dr. Wyline Copas ski who had seen her in 2018 at the [place] clinic. They will see her this afternoon at 215. I note she also sees cardiology in [place] at [facility] for following up of her pacemaker. Past medical history; congestive heart failure, COPD, history of low back pain status post surgery, atrial fibrillation with a pacemaker, on Xarelto and aspirin, history of falls, type 2 diabetes with peripheral neuropathy. She is in a wheelchair plus or minus a walker. ABIs in our clinic were 0.98 on the right and 1.41 on the left. In 2017 she had formal arterial studies showing an ABI at 1.32 on the right 1.27 on the left reduced TBI's at 0.24 at the right and 0.38 on the left. Waveforms were monophasic and biphasic on the right and  biphasic bilaterally on the left. She was not felt to have significant arterial disease at that time 5/20-Patient returns to clinic, she was seen by cardiology last week after being at the wound clinic due to concerns about congestive heart failure symptoms, patient had Lasix increased 260 mg total dose daily and since then apparently no changes other than probably increased swelling in the legs. The right leg is weeping the hematomas on the lower anterior shin appear at risk of being up into wounds The left lower extremity distal and proximal wounds with significant degree of slough Objective Constitutional alert and oriented x 3. sitting or standing blood pressure is within target range for patient.. supine blood pressure is within target range for patient.. pulse regular and within target range for patient.Marland Kitchen respirations regular, non-labored and within target range for patient.Marland Kitchen temperature within target range for patient.. Well-nourished and well-hydrated in no acute distress. Vitals Time Taken: 3:27 PM, Height: 67 in, Weight: 175 lbs, BMI: 27.4, Temperature: 98.1 F, Pulse: 66 bpm, Respiratory Rate: 18 breaths/min, Blood Pressure: 151/64 mmHg. General Notes: Left leg distal wound with significant degree of slough that was debrided after cold spray, some of the slough was easily removed but the layer below was more adherent and fibrinous. Some degree of bleeding was observed that stopped Left leg lateral wound also on recent hematoma is small and had layer of skin flap that was removed and this has a slightly bigger ulcerated margin now Left great toe without the nail appears to have a clean base Left knee hematoma is small Right anterior leg hematoma with 2 areas that appear ready to break down There is a lot of weeping from the right leg [name], Sarah F. ([id number]) Integumentary (Hair, Skin) Wound #1 status is Open. Original cause of wound was Gradually Appeared. The wound is located on  the Left,Proximal,Lateral Lower Leg. The wound measures 6cm length x 3cm width x 0.3cm depth; 14.137cm^2 area and 4.241cm^3 volume. There is Fat Layer (Subcutaneous Tissue) Exposed exposed. There is no tunneling or undermining noted. There is a large amount of serous drainage noted. The wound margin is flat and intact. There is no granulation within the wound bed. There is a large (67-100%) amount of necrotic tissue within the wound bed including Adherent Slough. Wound #  2 status is Open. Original cause of wound was Blister. The wound is located on the Left,Distal,Lateral Lower Leg. The wound measures 3cm length x 0.6cm width x 0.1cm depth; 1.414cm^2 area and 0.141cm^3 volume. Wound #3 status is Open. Original cause of wound was Trauma. The wound is located on the Left Knee. The wound measures 1.8cm length x 2cm width x 0.1cm depth; 2.827cm^2 area and 0.283cm^3 volume. There is Fat Layer (Subcutaneous Tissue) Exposed exposed. There is no tunneling or undermining noted. There is a small amount of serous drainage noted. The wound margin is flat and intact. There is small (1-33%) red granulation within the wound bed. There is a large (67-100%) amount of necrotic tissue within the wound bed including Adherent Slough. Wound #4 status is Open. Original cause of wound was Trauma. The wound is located on the Left Toe Great. The wound measures 2.5cm length x 2cm width x 0.1cm depth; 3.927cm^2 area and 0.393cm^3 volume. The wound is limited to skin breakdown. There is no tunneling or undermining noted. There is a large amount of sanguinous drainage noted. The wound margin is indistinct and nonvisible. There is large (67-100%) pink granulation within the wound bed. There is no necrotic tissue within the wound bed. Wound #5 status is Open. Original cause of wound was Gradually Appeared. The wound is located on the Right,Anterior Lower Leg. The wound measures 2.2cm length x 4cm width x 0.1cm depth; 6.912cm^2  area and 0.691cm^3 volume. The wound is limited to skin breakdown. There is no tunneling or undermining noted. There is a large amount of serous drainage noted. The wound margin is flat and intact. There is no granulation within the wound bed. There is a small (1-33%) amount of necrotic tissue within the wound bed including Eschar. Plan 1. Continue silver cell dressing to all wounds 2. Patient is hardly able to tolerate the 2 layer wrap, will try Tubigrip this time around 3. Patient has cardiology appointment tomorrow-will probably benefit with being on Zaroxolyn in addition to Lasix to handle edema which is extensive and debilitating at this time Return to clinic next week Electronic Signature(s) Signed: 11/11/2018 4:15:32 PM By: Tobi Bastos Entered By: Tobi Bastos on 11/11/2018 16:15:32 [name] ([id number]) -------------------------------------------------------------------------------- [place] Details Patient [name] Date of Service: 11/11/2018 Medical Record Number: [id number] Patient Account Number: [id number] Date of Birth/Sex: 1925-02-05 (85 y.o. 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 S80.11XD Contusion of right lower leg, subsequent encounter I50.30 Unspecified diastolic (congestive) heart failure Facility Procedures CPT4 Code Description: [id number] 11042 - DEB SUBQ TISSUE 20 SQ CM/< ICD-10 Diagnosis Description L97.822 Non-pressure chronic ulcer of other part of left lower leg with Modifier: fat layer expos Quantity: 1 ed Physician Procedures CPT4 Code Description: [id number]  03546 - WC PHYS SUBQ TISS 20 SQ CM ICD-10 Diagnosis Description L97.822 Non-pressure chronic ulcer of other part of left lower leg with Modifier: fat layer expos Quantity: 1 ed Electronic Signature(s) Signed: 11/11/2018 4:15:45 PM By: Tobi Bastos Entered By: Tobi Bastos on 11/11/2018 16:15:45

## 2018-11-12 NOTE — Progress Notes (Addendum)
Sarah, Hoffman ([id number]) Visit Report for 11/11/2018 Arrival Information Details Patient [name], [name] Date of Service: 11/11/2018 3:30 PM Medical Record Number: [id number] Patient Account Number: [id number] Date of Birth/Sex: 07/22/1924 (85 y.o. F) Treating RN: Harold Barban Primary Care Ailyne Pawley: Tracie Harrier Other Clinician: Referring Gaspare Netzel: Tracie Harrier Treating Newt Levingston/Extender: Beverly Gust in Treatment: 1 Visit Information History Since Last Visit Added or deleted any medications: No Patient Arrived: Wheel Chair Any new allergies or adverse reactions: No Arrival Time: 15:26 Had a fall or experienced change in No Accompanied By: daughter activities of daily living that may affect Transfer Assistance: None risk of falls: Patient Identification Verified: Yes Signs or symptoms of abuse/neglect since last visito No Secondary Verification Process Yes Hospitalized since last visit: No Completed: Has Dressing in Place as Prescribed: Yes Patient Has Alerts: Yes Has Compression in Place as Prescribed: Yes Patient Alerts: LIDOCAINE Pain Present Now: Yes ALLERGY!! Electronic Signature(s) Signed: 11/11/2018 4:44:13 PM By: Harold Barban Entered By: Harold Barban on 11/11/2018 15:26:37 [name] ([id number]) -------------------------------------------------------------------------------- Lower Extremity Assessment Details Patient [name] Date of Service: 11/11/2018 3:30 PM Medical Record Number: [id number] Patient Account Number: [id number] Date of Birth/Sex: 08/05/1924 (85 y.o. F) Treating RN: Harold Barban Primary Care Quade Ramirez: Tracie Harrier Other Clinician: Referring Emmani Lesueur: Tracie Harrier Treating Deunte Bledsoe/Extender: Beverly Gust in Treatment: 1 Edema Assessment Assessed: [Left: No] [Right: No] [Left: Edema] [Right: :] Calf Left: Right: Point of Measurement: 35 cm From Medial Instep 46.2 cm 46  cm Ankle Left: Right: Point of Measurement: 12 cm From Medial Instep 21.2 cm 21.5 cm Vascular Assessment Pulses: Dorsalis Pedis Palpable: [Left:Yes] [Right:Yes] Posterior Tibial Palpable: [Left:Yes] [Right:Yes] Electronic Signature(s) Signed: 11/11/2018 4:44:13 PM By: Harold Barban Entered By: Harold Barban on 11/11/2018 15:48:36 [name] ([id number]) -------------------------------------------------------------------------------- Multi Wound Chart Details Patient [name] Date of Service: 11/11/2018 3:30 PM Medical Record Number: [id number] Patient Account Number: [id number] Date of Birth/Sex: 08/31/1924 (85 y.o. 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283 % Reduction in Area: 3.00% -71.40%  -172.60% % Reduction in Volume: 3.00% -72.00% -172.10% Classification: Grade 2 Grade 1 Grade 1 Exudate Amount: Large N/A Small Exudate Type: Serous N/A Serous Exudate Color: amber N/A amber Wound Margin: Flat and Intact N/A Flat and Intact Granulation Amount: None Present (0%) N/A Small (1-33%) Granulation Quality: N/A N/A Red Necrotic Amount: Large (67-100%) N/A Large (67-100%) Necrotic Tissue: Adherent [name] Structures: Fat Layer (Subcutaneous N/A Fat Layer (Subcutaneous Tissue) Exposed: Yes Tissue) Exposed: Yes Fascia: No Fascia: No Tendon: No Tendon: No Muscle:  No Muscle: No Joint: No Joint: No Bone: No Bone: No Epithelialization: None N/A Small (1-33%) DALIS, BEERS ([id number]) Wound Number: 4 5 N/A Photos: N/A Wound Location: Left Toe Great Right Lower Leg - Anterior N/A Wounding Event: Trauma Gradually Appeared N/A Primary Etiology: Diabetic Wound/Ulcer of the Diabetic Wound/Ulcer of the N/A Lower Extremity Lower Extremity Comorbid History: Anemia, Chronic Obstructive Anemia, Chronic Obstructive N/A Pulmonary Disease (COPD), Pulmonary Disease (COPD), Arrhythmia, Congestive Heart Arrhythmia, Congestive Heart Failure, Hypertension, Type II Failure, Hypertension, Type II Diabetes, Neuropathy Diabetes, Neuropathy Date Acquired: 11/03/2018 08/13/2018 N/A Weeks of Treatment: 1 1 N/A Wound Status: Open Open N/A Clustered Wound: No Yes N/A Measurements L x W x D 2.5x2x0.1 2.2x4x0.1 N/A (cm) Area (cm) : 3.927 6.912 N/A Volume (cm) : 0.393 0.691 N/A % Reduction in Area: -32.30% -46.70% N/A % Reduction in Volume: -32.30% -46.70% N/A Classification: Grade 1 Grade 1 N/A Exudate Amount: Large Large N/A Exudate Type: Sanguinous Serous N/A Exudate Color: red amber N/A Wound Margin: Indistinct, nonvisible Flat and Intact N/A Granulation Amount: Large (67-100%) None Present (0%) N/A Granulation Quality: Pink N/A N/A Necrotic Amount: None  Present (0%) Small (1-33%) N/A Necrotic Tissue: N/A Eschar N/A Exposed Structures: Fascia: No Fascia: No N/A Fat Layer (Subcutaneous Fat Layer (Subcutaneous Tissue) Exposed: No Tissue) Exposed: No Tendon: No Tendon: No Muscle: No Muscle: No Joint: No Joint: No Bone: No Bone: No Limited to Skin Breakdown Limited to Skin Breakdown Epithelialization: Small (1-33%) Small (1-33%) N/A Treatment Notes Electronic Signature(s) Signed: 11/11/2018 5:05:53 PM By: Gretta Cool, BSN, RN, CWS, Kim RN, BSN Entered By: Gretta Cool, BSN, RN, CWS, Kim on 11/11/2018 16:12:51 [name] ([id number]) -------------------------------------------------------------------------------- [place] Details Patient [name] Date of Service: 11/11/2018 3:30 PM Medical Record Number: [id number] Patient Account Number: [id number] Date of Birth/Sex: June 29, 1924 (85 y.o. F) Treating RN: Cornell Barman Primary Care Terica Yogi: Tracie Harrier Other Clinician: Referring Madisynn Plair: Tracie Harrier Treating Hasini Peachey/Extender: Beverly Gust in Treatment: 1 Active Inactive Electronic Signature(s) Signed: 11/18/2018 10:13:55 AM By: Gretta Cool, BSN, RN, CWS, Kim RN, BSN Previous Signature: 11/11/2018 5:05:53 PM Version By: Gretta Cool, BSN, RN, CWS, Kim RN, BSN Entered By: Gretta Cool, BSN, RN, CWS, Kim on 11/18/2018 10:13:54 [name] ([id number]) -------------------------------------------------------------------------------- Pain Assessment Details Patient [name] Date of Service: 11/11/2018 3:30 PM Medical Record Number: [id number] Patient Account Number: [id number] Date of Birth/Sex: 09-28-1924 (85 y.o. F) Treating RN: Harold Barban Primary Care Brittlyn Cloe: Tracie Harrier Other Clinician: Referring Makaya Juneau: Tracie Harrier Treating Kinnedy Mongiello/Extender: Beverly Gust in Treatment: 1 Active Problems Location of Pain Severity and Description of Pain Patient Has Paino Yes Site  Locations Rate the pain. Current Pain Level: 9 Pain Management and Medication Current Pain Management: Electronic Signature(s) Signed: 11/11/2018 4:44:13 PM By: Harold Barban Entered By: Harold Barban on 11/11/2018 15:27:03 [name] ([id number]) -------------------------------------------------------------------------------- Patient/Caregiver Education Details Patient [name] Date of Service: 11/11/2018 3:30 PM Medical Record Number: [id number] Patient Account Number: [id number] Date of Birth/Gender: [birth year] (85 y.o. F) Treating RN: Cornell Barman Primary Care Physician: Tracie Harrier Other Clinician: Referring Physician: Tracie Harrier Treating Physician/Extender: Beverly Gust in Treatment: 1 Education Assessment Education Provided To: Patient Education Topics Provided Wound/Skin Impairment: Handouts: Caring for Your Ulcer Methods: Demonstration, Explain/Verbal Responses: State content correctly Electronic Signature(s) Signed: 11/11/2018 5:05:53 PM By: Gretta Cool, BSN, RN, CWS, Kim RN, BSN Entered By: Gretta Cool, BSN, RN, CWS, Kim on 11/11/2018 16:24:01 [name] ([id number]) -------------------------------------------------------------------------------- Wound Assessment Details Patient [name] Date  of Service: 11/11/2018 3:30 PM Medical Record Number: [id number] Patient Account Number: [id number] Date of Birth/Sex: 1924/07/09 (85 y.o. F) Treating RN: Harold Barban Primary Care Arther Heisler: Tracie Harrier Other Clinician: Referring Tajia Szeliga: Tracie Harrier Treating Montserrat Shek/Extender: Beverly Gust in Treatment: 1 Wound Status Wound Number: 1 Primary Diabetic Wound/Ulcer of the Lower Extremity Etiology: Wound Location: Left Lower Leg - Lateral, Proximal Wound Open Wounding Event: Gradually Appeared Status: Date Acquired: 08/12/2018 Comorbid Anemia, Chronic Obstructive Pulmonary Disease Weeks Of Treatment:  1 History: (COPD), Arrhythmia, Congestive Heart Failure, Clustered Wound: No Hypertension, Type II Diabetes, Neuropathy Photos Wound Measurements Length: (cm) 6 Width: (cm) 3 Depth: (cm) 0.3 Area: (cm) 14.137 Volume: (cm) 4.241 % Reduction in Area: 3% % Reduction in Volume: 3% Epithelialization: None Tunneling: No Undermining: No Wound Description Classification: Grade 2 Wound Margin: Flat and Intact Exudate Amount: Large Exudate Type: Serous Exudate Color: amber Foul Odor After Cleansing: No Slough/Fibrino Yes Wound Bed Granulation Amount: None Present (0%) Exposed Structure Necrotic Amount: Large (67-100%) Fascia Exposed: No Necrotic Quality: Adherent Slough Fat Layer (Subcutaneous Tissue) Exposed: Yes Tendon Exposed: No Muscle Exposed: No Joint Exposed: No Bone Exposed: No Electronic Signature(s) Signed: 11/11/2018 4:44:13 PM By: Harold Barban Entered By: Harold Barban on 11/11/2018 15:43:43 [name] ([id number]) -------------------------------------------------------------------------------- Wound Assessment Details Patient [name] Date of Service: 11/11/2018 3:30 PM Medical Record Number: [id number] Patient Account Number: [id number] Date of Birth/Sex: [birth year] (85 y.o. F) Treating RN: Harold Barban Primary Care Gianny Sabino: Tracie Harrier Other Clinician: Referring Jazon Jipson: Tracie Harrier Treating Seda Kronberg/Extender: Beverly Gust in Treatment: 1 Wound Status Wound Number: 2 Primary Diabetic Wound/Ulcer of the Lower Etiology: Extremity Wound Location: Left, Distal, Lateral Lower Leg Wound Status: Open Wounding Event: Blister Date Acquired: 10/28/2018 Weeks Of Treatment: 1 Clustered Wound: No Wound Measurements Length: (cm) 3 Width: (cm) 0.6 Depth: (cm) 0.1 Area: (cm) 1.414 Volume: (cm) 0.141 % Reduction in Area: -71.4% % Reduction in Volume: -72% Wound Description Classification: Grade 1 Electronic  Signature(s) Signed: 11/11/2018 4:44:13 PM By: Harold Barban Entered By: Harold Barban on 11/11/2018 15:42:14 [name] ([id number]) -------------------------------------------------------------------------------- Wound Assessment Details Patient [name] Date of Service: 11/11/2018 3:30 PM Medical Record Number: [id number] Patient Account Number: [id number] Date of Birth/Sex: [birth year] (85 y.o. F) Treating RN: Harold Barban Primary Care Alecia Doi: Tracie Harrier Other Clinician: Referring Cannie Muckle: Tracie Harrier Treating Eithen Castiglia/Extender: Beverly Gust in Treatment: 1 Wound Status Wound Number: 3 Primary Diabetic Wound/Ulcer of the Lower Extremity Etiology: Wound Location: Left Knee Wound Open Wounding Event: Trauma Status: Date Acquired: 10/28/2018 Comorbid Anemia, Chronic Obstructive Pulmonary Disease Weeks Of Treatment: 1 History: (COPD), Arrhythmia, Congestive Heart Failure, Clustered Wound: No Hypertension, Type II Diabetes, Neuropathy Photos Wound Measurements Length: (cm) 1.8 Width: (cm) 2 Depth: (cm) 0.1 Area: (cm) 2.827 Volume: (cm) 0.283 % Reduction in Area: -172.6% % Reduction in Volume: -172.1% Epithelialization: Small (1-33%) Tunneling: No Undermining: No Wound Description Classification: Grade 1 Wound Margin: Flat and Intact Exudate Amount: Small Exudate Type: Serous Exudate Color: amber Foul Odor After Cleansing: No Slough/Fibrino Yes Wound Bed Granulation Amount: Small (1-33%) Exposed Structure Granulation Quality: Red Fascia Exposed: No Necrotic Amount: Large (67-100%) Fat Layer (Subcutaneous Tissue) Exposed: Yes Necrotic Quality: Adherent Slough Tendon Exposed: No Muscle Exposed: No Joint Exposed: No Bone Exposed: No Electronic Signature(s) Signed: 11/11/2018 4:44:13 PM By: Harold Barban Entered By: Harold Barban on 11/11/2018 15:45:08 [name]  ([id number]) -------------------------------------------------------------------------------- Wound Assessment Details Patient [name] Date of Service: 11/11/2018 3:30 PM Medical  Record Number: [id number] Patient Account Number: [id number] Date of Birth/Sex: [birth year] (85 y.o. F) Treating RN: Harold Barban Primary Care Toney Lizaola: Tracie Harrier Other Clinician: Referring Chitara Clonch: Tracie Harrier Treating Merritt Mccravy/Extender: Beverly Gust in Treatment: 1 Wound Status Wound Number: 4 Primary Diabetic Wound/Ulcer of the Lower Extremity Etiology: Wound Location: Left Toe Great Wound Open Wounding Event: Trauma Status: Date Acquired: 11/03/2018 Comorbid Anemia, Chronic Obstructive Pulmonary Disease Weeks Of Treatment: 1 History: (COPD), Arrhythmia, Congestive Heart Failure, Clustered Wound: No Hypertension, Type II Diabetes, Neuropathy Photos Wound Measurements Length: (cm) 2.5 Width: (cm) 2 Depth: (cm) 0.1 Area: (cm) 3.927 Volume: (cm) 0.393 % Reduction in Area: -32.3% % Reduction in Volume: -32.3% Epithelialization: Small (1-33%) Tunneling: No Undermining: No Wound Description Classification: Grade 1 Wound Margin: Indistinct, nonvisible Exudate Amount: Large Exudate Type: Sanguinous Exudate Color: red Foul Odor After Cleansing: No Slough/Fibrino No Wound Bed Granulation Amount: Large (67-100%) Exposed Structure Granulation Quality: Pink Fascia Exposed: No Necrotic Amount: None Present (0%) Fat Layer (Subcutaneous Tissue) Exposed: No Tendon Exposed: No Muscle Exposed: No Joint Exposed: No Bone Exposed: No Limited to Skin Breakdown Electronic Signature(s) Signed: 11/11/2018 4:44:13 PM By: Harold Barban Entered By: Harold Barban on 11/11/2018 15:45:37 [name] ([id number]) -------------------------------------------------------------------------------- Wound Assessment Details Patient [name] Date of Service:  11/11/2018 3:30 PM Medical Record Number: [id number] Patient Account Number: [id number] Date of Birth/Sex: 06-Mar-1925 (85 y.o. F) Treating RN: Harold Barban Primary Care Violet Seabury: Tracie Harrier Other Clinician: Referring Zelphia Glover: Tracie Harrier Treating Harve Spradley/Extender: Beverly Gust in Treatment: 1 Wound Status Wound Number: 5 Primary Diabetic Wound/Ulcer of the Lower Extremity Etiology: Wound Location: Right Lower Leg - Anterior Wound Open Wounding Event: Gradually Appeared Status: Date Acquired: 08/13/2018 Comorbid Anemia, Chronic Obstructive Pulmonary Disease Weeks Of Treatment: 1 History: (COPD), Arrhythmia, Congestive Heart Failure, Clustered Wound: No Hypertension, Type II Diabetes, Neuropathy Photos Wound Measurements Length: (cm) 2.2 Width: (cm) 4 Depth: (cm) 0.1 Area: (cm) 6.912 Volume: (cm) 0.691 % Reduction in Area: -46.7% % Reduction in Volume: -46.7% Epithelialization: Small (1-33%) Tunneling: No Undermining: No Wound Description Classification: Grade 1 Wound Margin: Flat and Intact Exudate Amount: Large Exudate Type: Serous Exudate Color: amber Foul Odor After Cleansing: No Slough/Fibrino No Wound Bed Granulation Amount: None Present (0%) Exposed Structure Necrotic Amount: Small (1-33%) Fascia Exposed: No Necrotic Quality: Eschar Fat Layer (Subcutaneous Tissue) Exposed: No Tendon Exposed: No Muscle Exposed: No Joint Exposed: No Bone Exposed: No Limited to Skin Breakdown Electronic Signature(s) Signed: 11/11/2018 4:44:13 PM By: Harold Barban Entered By: Harold Barban on 11/11/2018 15:44:37 [name] ([id number]) -------------------------------------------------------------------------------- [place] Details Patient [name] Date of Service: 11/11/2018 3:30 PM Medical Record Number: [id number] Patient Account Number: [id number] Date of Birth/Sex: 1924/08/04 (85 y.o. F) Treating RN: Harold Barban Primary  Care Saralee Bolick: Tracie Harrier Other Clinician: Referring Vitalia Stough: Tracie Harrier Treating Aashrith Eves/Extender: Beverly Gust in Treatment: 1 Vital Signs Time Taken: 15:27 Temperature (F): 98.1 Height (in): 67 Pulse (bpm): 66 Weight (lbs): 175 Respiratory Rate (breaths/min): 18 Body Mass Index (BMI): 27.4 Blood Pressure (mmHg): 151/64 Reference Range: 80 - 120 mg / dl Electronic Signature(s) Signed: 11/11/2018 4:44:13 PM By: Harold Barban Entered By: Harold Barban on 11/11/2018 15:28:06

## 2018-11-13 ENCOUNTER — Inpatient Hospital Stay
Admission: EM | Admit: 2018-11-13 | Discharge: 2018-11-17 | DRG: 292 | Disposition: A | Discharge status: Hospice | Payer: Medicare Other | Source: Skilled Nursing Facility | Attending: Internal Medicine | Admitting: Internal Medicine

## 2018-11-13 ENCOUNTER — Emergency Department: Payer: Medicare Other

## 2018-11-13 ENCOUNTER — Other Ambulatory Visit: Payer: Self-pay

## 2018-11-13 ENCOUNTER — Encounter: Payer: Self-pay | Admitting: *Deleted

## 2018-11-13 DIAGNOSIS — Z7901 Long term (current) use of anticoagulants: Secondary | ICD-10-CM

## 2018-11-13 DIAGNOSIS — I110 Hypertensive heart disease with heart failure: Secondary | ICD-10-CM | POA: Diagnosis not present

## 2018-11-13 DIAGNOSIS — N179 Acute kidney failure, unspecified: Secondary | ICD-10-CM | POA: Diagnosis present

## 2018-11-13 DIAGNOSIS — Z833 Family history of diabetes mellitus: Secondary | ICD-10-CM | POA: Diagnosis not present

## 2018-11-13 DIAGNOSIS — J449 Chronic obstructive pulmonary disease, unspecified: Secondary | ICD-10-CM | POA: Diagnosis present

## 2018-11-13 DIAGNOSIS — R0902 Hypoxemia: Secondary | ICD-10-CM | POA: Diagnosis present

## 2018-11-13 DIAGNOSIS — R0602 Shortness of breath: Secondary | ICD-10-CM | POA: Diagnosis present

## 2018-11-13 DIAGNOSIS — Z8249 Family history of ischemic heart disease and other diseases of the circulatory system: Secondary | ICD-10-CM

## 2018-11-13 DIAGNOSIS — Z885 Allergy status to narcotic agent status: Secondary | ICD-10-CM

## 2018-11-13 DIAGNOSIS — Z955 Presence of coronary angioplasty implant and graft: Secondary | ICD-10-CM | POA: Diagnosis not present

## 2018-11-13 DIAGNOSIS — Z794 Long term (current) use of insulin: Secondary | ICD-10-CM | POA: Diagnosis not present

## 2018-11-13 DIAGNOSIS — Z91040 Latex allergy status: Secondary | ICD-10-CM | POA: Diagnosis not present

## 2018-11-13 DIAGNOSIS — I509 Heart failure, unspecified: Secondary | ICD-10-CM

## 2018-11-13 DIAGNOSIS — Z85828 Personal history of other malignant neoplasm of skin: Secondary | ICD-10-CM | POA: Diagnosis not present

## 2018-11-13 DIAGNOSIS — Z7982 Long term (current) use of aspirin: Secondary | ICD-10-CM | POA: Diagnosis not present

## 2018-11-13 DIAGNOSIS — I5033 Acute on chronic diastolic (congestive) heart failure: Secondary | ICD-10-CM | POA: Diagnosis present

## 2018-11-13 DIAGNOSIS — I482 Chronic atrial fibrillation, unspecified: Secondary | ICD-10-CM | POA: Diagnosis present

## 2018-11-13 DIAGNOSIS — W19XXXA Unspecified fall, initial encounter: Secondary | ICD-10-CM | POA: Diagnosis present

## 2018-11-13 DIAGNOSIS — Z823 Family history of stroke: Secondary | ICD-10-CM

## 2018-11-13 DIAGNOSIS — Z888 Allergy status to other drugs, medicaments and biological substances status: Secondary | ICD-10-CM

## 2018-11-13 DIAGNOSIS — Z79899 Other long term (current) drug therapy: Secondary | ICD-10-CM

## 2018-11-13 DIAGNOSIS — S0081XA Abrasion of other part of head, initial encounter: Secondary | ICD-10-CM | POA: Diagnosis present

## 2018-11-13 DIAGNOSIS — I5023 Acute on chronic systolic (congestive) heart failure: Secondary | ICD-10-CM | POA: Diagnosis present

## 2018-11-13 DIAGNOSIS — Z20828 Contact with and (suspected) exposure to other viral communicable diseases: Secondary | ICD-10-CM | POA: Diagnosis present

## 2018-11-13 DIAGNOSIS — Z950 Presence of cardiac pacemaker: Secondary | ICD-10-CM

## 2018-11-13 DIAGNOSIS — M549 Dorsalgia, unspecified: Secondary | ICD-10-CM | POA: Diagnosis present

## 2018-11-13 DIAGNOSIS — M419 Scoliosis, unspecified: Secondary | ICD-10-CM | POA: Diagnosis present

## 2018-11-13 DIAGNOSIS — E1151 Type 2 diabetes mellitus with diabetic peripheral angiopathy without gangrene: Secondary | ICD-10-CM | POA: Diagnosis present

## 2018-11-13 DIAGNOSIS — Z66 Do not resuscitate: Secondary | ICD-10-CM | POA: Diagnosis present

## 2018-11-13 DIAGNOSIS — Z90710 Acquired absence of both cervix and uterus: Secondary | ICD-10-CM | POA: Diagnosis not present

## 2018-11-13 DIAGNOSIS — G8929 Other chronic pain: Secondary | ICD-10-CM | POA: Diagnosis present

## 2018-11-13 DIAGNOSIS — Z79891 Long term (current) use of opiate analgesic: Secondary | ICD-10-CM

## 2018-11-13 LAB — COMPREHENSIVE METABOLIC PANEL
ALT: 22 U/L (ref 0–44)
AST: 35 U/L (ref 15–41)
Albumin: 3.1 g/dL — ABNORMAL LOW (ref 3.5–5.0)
Alkaline Phosphatase: 91 U/L (ref 38–126)
Anion gap: 9 (ref 5–15)
BUN: 27 mg/dL — ABNORMAL HIGH (ref 8–23)
CO2: 32 mmol/L (ref 22–32)
Calcium: 8.8 mg/dL — ABNORMAL LOW (ref 8.9–10.3)
Chloride: 99 mmol/L (ref 98–111)
Creatinine, Ser: 1.28 mg/dL — ABNORMAL HIGH (ref 0.44–1.00)
GFR calc Af Amer: 42 mL/min — ABNORMAL LOW (ref >60)
GFR calc non Af Amer: 37 mL/min — ABNORMAL LOW (ref >60)
Glucose, Bld: 153 mg/dL — ABNORMAL HIGH (ref 70–99)
Potassium: 4.1 mmol/L (ref 3.5–5.1)
Sodium: 140 mmol/L (ref 135–145)
Total Bilirubin: 1 mg/dL (ref 0.3–1.2)
Total Protein: 6.4 g/dL — ABNORMAL LOW (ref 6.5–8.1)

## 2018-11-13 LAB — CBC WITH DIFFERENTIAL/PLATELET
Abs Immature Granulocytes: 0.03 K/uL (ref 0.00–0.07)
Basophils Absolute: 0.1 K/uL (ref 0.0–0.1)
Basophils Relative: 1 %
Eosinophils Absolute: 0.1 K/uL (ref 0.0–0.5)
Eosinophils Relative: 1 %
HCT: 33.7 % — ABNORMAL LOW (ref 36.0–46.0)
Hemoglobin: 10 g/dL — ABNORMAL LOW (ref 12.0–15.0)
Immature Granulocytes: 0 %
Lymphocytes Relative: 13 %
Lymphs Abs: 1.1 K/uL (ref 0.7–4.0)
MCH: 29.1 pg (ref 26.0–34.0)
MCHC: 29.7 g/dL — ABNORMAL LOW (ref 30.0–36.0)
MCV: 98 fL (ref 80.0–100.0)
Monocytes Absolute: 1.2 K/uL — ABNORMAL HIGH (ref 0.1–1.0)
Monocytes Relative: 14 %
Neutro Abs: 6.3 K/uL (ref 1.7–7.7)
Neutrophils Relative %: 71 %
Platelets: 157 K/uL (ref 150–400)
RBC: 3.44 MIL/uL — ABNORMAL LOW (ref 3.87–5.11)
RDW: 19.8 % — ABNORMAL HIGH (ref 11.5–15.5)
WBC: 8.8 K/uL (ref 4.0–10.5)
nRBC: 0 % (ref 0.0–0.2)

## 2018-11-13 LAB — BRAIN NATRIURETIC PEPTIDE: B Natriuretic Peptide: 1266 pg/mL — ABNORMAL HIGH (ref 0.0–100.0)

## 2018-11-13 LAB — TROPONIN I
Troponin I: 0.05 ng/mL
Troponin I: 0.05 ng/mL (ref <0.03)

## 2018-11-13 LAB — SARS CORONAVIRUS 2 BY RT PCR (HOSPITAL ORDER, PERFORMED IN ~~LOC~~ HOSPITAL LAB): SARS Coronavirus 2: NEGATIVE

## 2018-11-13 MED ORDER — SODIUM CHLORIDE 0.9 % IV SOLN: 250.0000 mL | INTRAVENOUS | Status: DC | PRN

## 2018-11-13 MED ORDER — ACETAMINOPHEN 325 MG PO TABS
650.0000 mg | ORAL_TABLET | ORAL | Status: DC | PRN
  Administered 2018-11-14 – 2018-11-17 (×2): 650 mg via ORAL
  Filled 2018-11-13 (×2): qty 2

## 2018-11-13 MED ORDER — VITAMIN D (ERGOCALCIFEROL) 1.25 MG (50000 UNIT) PO CAPS: 50000.0000 [IU] | ORAL_CAPSULE | ORAL | Status: DC

## 2018-11-13 MED ORDER — VITAMIN B-12 1000 MCG PO TABS
1000.0000 ug | ORAL_TABLET | Freq: Every day | ORAL | Status: DC
  Administered 2018-11-14 – 2018-11-17 (×4): 1000 ug via ORAL
  Filled 2018-11-13 (×4): qty 1

## 2018-11-13 MED ORDER — DOCUSATE SODIUM 100 MG PO CAPS
100.0000 mg | ORAL_CAPSULE | Freq: Two times a day (BID) | ORAL | Status: DC
  Administered 2018-11-14 – 2018-11-17 (×7): 100 mg via ORAL
  Filled 2018-11-13 (×7): qty 1

## 2018-11-13 MED ORDER — LORATADINE 10 MG PO TABS
10.0000 mg | ORAL_TABLET | Freq: Every day | ORAL | Status: DC
  Administered 2018-11-14 – 2018-11-17 (×4): 10 mg via ORAL
  Filled 2018-11-13 (×4): qty 1

## 2018-11-13 MED ORDER — ESCITALOPRAM OXALATE 10 MG PO TABS
10.0000 mg | ORAL_TABLET | Freq: Every day | ORAL | Status: DC
  Administered 2018-11-14 – 2018-11-17 (×4): 10 mg via ORAL
  Filled 2018-11-13 (×4): qty 1

## 2018-11-13 MED ORDER — BUSPIRONE HCL 5 MG PO TABS
5.0000 mg | ORAL_TABLET | Freq: Two times a day (BID) | ORAL | Status: DC
  Administered 2018-11-14 – 2018-11-17 (×7): 5 mg via ORAL
  Filled 2018-11-13 (×8): qty 1

## 2018-11-13 MED ORDER — INSULIN ASPART 100 UNIT/ML ~~LOC~~ SOLN: 0.0000 [IU] | Freq: Every day | SUBCUTANEOUS | Status: DC

## 2018-11-13 MED ORDER — IPRATROPIUM-ALBUTEROL 0.5-2.5 (3) MG/3ML IN SOLN: 3.0000 mL | Freq: Four times a day (QID) | RESPIRATORY_TRACT | Status: DC | PRN

## 2018-11-13 MED ORDER — SPIRONOLACTONE 25 MG PO TABS: 12.5000 mg | ORAL_TABLET | Freq: Every day | ORAL | Status: DC

## 2018-11-13 MED ORDER — FUROSEMIDE 10 MG/ML IJ SOLN: 40.0000 mg | Freq: Two times a day (BID) | INTRAMUSCULAR | Status: DC

## 2018-11-13 MED ORDER — GABAPENTIN 100 MG PO CAPS
100.0000 mg | ORAL_CAPSULE | Freq: Every day | ORAL | Status: DC
  Administered 2018-11-14 – 2018-11-16 (×3): 100 mg via ORAL
  Filled 2018-11-13 (×3): qty 1

## 2018-11-13 MED ORDER — SODIUM CHLORIDE 0.9% FLUSH: 3.0000 mL | INTRAVENOUS | Status: DC | PRN

## 2018-11-13 MED ORDER — TRAZODONE HCL 50 MG PO TABS
50.0000 mg | ORAL_TABLET | Freq: Every day | ORAL | Status: DC
  Administered 2018-11-14 – 2018-11-16 (×3): 50 mg via ORAL
  Filled 2018-11-13 (×3): qty 1

## 2018-11-13 MED ORDER — FERROUS SULFATE 325 (65 FE) MG PO TABS
325.0000 mg | ORAL_TABLET | Freq: Every day | ORAL | Status: DC
  Administered 2018-11-14 – 2018-11-17 (×4): 325 mg via ORAL
  Filled 2018-11-13 (×4): qty 1

## 2018-11-13 MED ORDER — SODIUM CHLORIDE 0.9% FLUSH
3.0000 mL | Freq: Two times a day (BID) | INTRAVENOUS | Status: DC
  Administered 2018-11-14 – 2018-11-17 (×8): 3 mL via INTRAVENOUS

## 2018-11-13 MED ORDER — INSULIN ASPART 100 UNIT/ML ~~LOC~~ SOLN
0.0000 [IU] | Freq: Three times a day (TID) | SUBCUTANEOUS | Status: DC
  Administered 2018-11-14 (×2): 3 [IU] via SUBCUTANEOUS
  Administered 2018-11-15: 17:00:00 5 [IU] via SUBCUTANEOUS
  Administered 2018-11-15: 8 [IU] via SUBCUTANEOUS
  Administered 2018-11-15: 3 [IU] via SUBCUTANEOUS
  Administered 2018-11-16: 13:00:00 5 [IU] via SUBCUTANEOUS
  Administered 2018-11-16: 8 [IU] via SUBCUTANEOUS
  Administered 2018-11-16: 3 [IU] via SUBCUTANEOUS
  Administered 2018-11-17 (×2): 5 [IU] via SUBCUTANEOUS
  Filled 2018-11-13 (×10): qty 1

## 2018-11-13 MED ORDER — CARVEDILOL 6.25 MG PO TABS
6.2500 mg | ORAL_TABLET | Freq: Two times a day (BID) | ORAL | Status: DC
  Administered 2018-11-14 – 2018-11-17 (×8): 6.25 mg via ORAL
  Filled 2018-11-13 (×7): qty 1

## 2018-11-13 MED ORDER — ASPIRIN EC 81 MG PO TBEC
81.0000 mg | DELAYED_RELEASE_TABLET | Freq: Every day | ORAL | Status: DC
  Administered 2018-11-14 – 2018-11-17 (×4): 81 mg via ORAL
  Filled 2018-11-13 (×4): qty 1

## 2018-11-13 MED ORDER — INSULIN GLARGINE 100 UNIT/ML ~~LOC~~ SOLN
10.0000 [IU] | Freq: Every day | SUBCUTANEOUS | Status: DC
  Administered 2018-11-14 – 2018-11-16 (×3): 10 [IU] via SUBCUTANEOUS
  Filled 2018-11-13 (×5): qty 0.1

## 2018-11-13 MED ORDER — MELATONIN 5 MG PO TABS
5.0000 mg | ORAL_TABLET | Freq: Every day | ORAL | Status: DC
  Administered 2018-11-14 – 2018-11-16 (×3): 5 mg via ORAL
  Filled 2018-11-13 (×5): qty 1

## 2018-11-13 MED ORDER — FUROSEMIDE 10 MG/ML IJ SOLN
40.0000 mg | Freq: Once | INTRAMUSCULAR | Status: AC
  Administered 2018-11-13: 18:00:00 40 mg via INTRAVENOUS
  Filled 2018-11-13: qty 4

## 2018-11-13 MED ORDER — VITAMIN D 1000 UNITS PO TABS: 2000.0000 [IU] | ORAL_TABLET | Freq: Every day | ORAL | Status: DC

## 2018-11-13 MED ORDER — SENNA 8.6 MG PO TABS
1.0000 | ORAL_TABLET | Freq: Every day | ORAL | Status: DC
  Administered 2018-11-14 – 2018-11-17 (×4): 8.6 mg via ORAL
  Filled 2018-11-13 (×4): qty 1

## 2018-11-13 MED ORDER — PANTOPRAZOLE SODIUM 40 MG PO TBEC
40.0000 mg | DELAYED_RELEASE_TABLET | Freq: Every day | ORAL | Status: DC
  Administered 2018-11-14 – 2018-11-17 (×4): 40 mg via ORAL
  Filled 2018-11-13 (×4): qty 1

## 2018-11-13 MED ORDER — ROPINIROLE HCL 0.25 MG PO TABS
0.5000 mg | ORAL_TABLET | Freq: Every day | ORAL | Status: DC
  Administered 2018-11-14 – 2018-11-16 (×3): 0.5 mg via ORAL
  Filled 2018-11-13 (×5): qty 2

## 2018-11-13 MED ORDER — OXYCODONE HCL 5 MG PO TABS
5.0000 mg | ORAL_TABLET | Freq: Once | ORAL | Status: AC
  Administered 2018-11-13: 23:00:00 5 mg via ORAL
  Filled 2018-11-13: qty 1

## 2018-11-13 MED ORDER — VITAMIN C 500 MG PO TABS
500.0000 mg | ORAL_TABLET | Freq: Every day | ORAL | Status: DC
  Administered 2018-11-14 – 2018-11-17 (×4): 500 mg via ORAL
  Filled 2018-11-13 (×4): qty 1

## 2018-11-13 MED ORDER — RIVAROXABAN 15 MG PO TABS
15.0000 mg | ORAL_TABLET | Freq: Every day | ORAL | Status: DC
  Administered 2018-11-14 – 2018-11-17 (×4): 15 mg via ORAL
  Filled 2018-11-13 (×4): qty 1

## 2018-11-13 MED ORDER — ONDANSETRON HCL 4 MG/2ML IJ SOLN
4.0000 mg | Freq: Four times a day (QID) | INTRAMUSCULAR | Status: DC | PRN
  Administered 2018-11-14 – 2018-11-16 (×2): 4 mg via INTRAVENOUS
  Filled 2018-11-13 (×2): qty 2

## 2018-11-13 MED ORDER — BUPROPION HCL ER (XL) 150 MG PO TB24
300.0000 mg | ORAL_TABLET | Freq: Every day | ORAL | Status: DC
  Administered 2018-11-14 – 2018-11-17 (×4): 300 mg via ORAL
  Filled 2018-11-13 (×4): qty 2

## 2018-11-13 NOTE — ED Triage Notes (Signed)
Per EMs report, patient lives at [facility] [place] and is on O2 via [place] constantly. Staff states patient was low 80's on room air. Patient arrived on 4L via EMS and was 100%. Dr. Cinda Quest aware.

## 2018-11-13 NOTE — ED Notes (Signed)
Pt repositioned in bed for comfort.

## 2018-11-13 NOTE — ED Notes (Signed)
Dr. Cinda Quest aware of Troponin of 0.05

## 2018-11-13 NOTE — ED Notes (Signed)
NP with hospitalist in to see pt.

## 2018-11-13 NOTE — ED Provider Notes (Signed)
[facility] Emergency Department Provider Note   ____________________________________________   First MD Initiated Contact with Patient 11/13/18 1558     (approximate)  I have reviewed the triage vital signs and the nursing notes.   HISTORY  Chief Complaint Shortness of Breath    HPI [name] is a 85 y.o. female sent to nursing home for low oxygen saturations.  Patient does not seem to be running a fever or coughing.  Patient herself says she wears oxygen.  I cannot find evidence of this in the old record though.  Patient's O2 sats come up nicely on 2 L.  They were in the 80s on room air.  Patient also apparently fell last night and hit her head.  Patient has a history of CHF and COPD.         Past Medical History:  Diagnosis Date   Cancer ([place])    skin ca   CHF (congestive heart failure) (HCC)    Chronic back pain    COPD (chronic obstructive pulmonary disease) (HCC)    Diabetes mellitus without complication ([place])    Hypertension    Pacemaker    2015   Poor perfusion of leg     Patient Active Problem List   Diagnosis Date Noted   Dyspnea 07/23/2018   Weakness generalized 07/23/2018   Goals of care, counseling/discussion    Palliative care encounter    Influenza A 07/05/2018   CHF exacerbation ([place]) 06/14/2018   Cellulitis of left lower extremity 06/02/2018   ARF (acute renal failure) ([place]) 05/17/2018   Pneumonia 12/11/2017   CAP (community acquired pneumonia) 04/14/2017   COPD (chronic obstructive pulmonary disease) ([place]) 12/07/2016   UTI (urinary tract infection) 12/07/2016   Ventral hernia without obstruction or gangrene    Hyponatremia 09/07/2015   Acute respiratory failure ([place]) 09/01/2015   Atrial fibrillation ([place]) 04/10/2015   Essential hypertension 04/10/2015   Diabetes ([place]) 04/10/2015   Acute on chronic combined systolic and diastolic CHF (congestive heart failure) ([place]) 03/21/2015    Pulmonary nodule 11/10/2014    Past Surgical History:  Procedure Laterality Date   ABDOMINAL HYSTERECTOMY     BACK SURGERY     x3   CORONARY STENT PLACEMENT     unknown location per pt   FOOT SURGERY     LEG SURGERY     PACEMAKER PLACEMENT     TONSILLECTOMY      Prior to Admission medications   Medication Sig Start Date End Date Taking? Authorizing Provider  acetaminophen (TYLENOL) 650 MG CR tablet Take 650 mg by mouth every 8 (eight) hours.   Yes [provider]  aspirin EC 81 MG tablet Take 81 mg by mouth daily.   Yes [provider]  baclofen (LIORESAL) 10 MG tablet Take 5 mg by mouth 3 (three) times daily as needed for muscle spasms.    Yes [provider]  buPROPion (WELLBUTRIN XL) 300 MG 24 hr tablet Take 300 mg by mouth daily.  11/28/17  Yes [provider]  busPIRone (BUSPAR) 5 MG tablet Take 5 mg by mouth 2 (two) times daily.   Yes [provider]  carvedilol (COREG) 6.25 MG tablet Take 6.25 mg by mouth 2 (two) times daily with a meal.    Yes [provider]  cholecalciferol (VITAMIN D) 1000 units tablet Take 2,000 Units by mouth daily.    Yes [provider]  docusate sodium (COLACE) 100 MG capsule Take 100 mg by  mouth 2 (two) times daily.    Yes [provider]  escitalopram (LEXAPRO) 10 MG tablet Take 10 mg by mouth daily.   Yes [provider]  ferrous sulfate 325 (65 FE) MG tablet Take 325 mg by mouth daily with breakfast.   Yes [provider]  fexofenadine (ALLEGRA) 60 MG tablet Take 60 mg by mouth 2 (two) times daily.   Yes [provider]  furosemide (LASIX) 80 MG tablet Take 80 mg by mouth 2 (two) times daily.   Yes [provider]  gabapentin (NEURONTIN) 100 MG capsule Take 100-200 mg by mouth at bedtime. Take 1 capsule (100mg ) by mouth every night at bedtime and 1 additional capsule (100mg ) by mouth at bedtime as needed for leg pain   Yes [provider]  insulin glargine (LANTUS) 100 UNIT/ML injection Inject 0.2 mLs (20 Units total) into the skin at bedtime. Patient taking differently: Inject 10 Units into the skin at bedtime.  09/07/18  Yes [name], MD  ipratropium-albuterol (DUONEB) 0.5-2.5 (3) MG/3ML SOLN Take 3 mLs by nebulization every 6 (six) hours as needed (wheezing). Patient taking differently: Take 3 mLs by nebulization every 6 (six) hours as needed (shortness of breath).  07/09/18  Yes Gladstone Lighter, MD  loperamide (IMODIUM) 2 MG capsule Take 2-4 mg by mouth See admin instructions. Take 2 capsules (4mg ) by mouth after initial loose stool and take 1 capsule (2mg ) by mouth after second loose stool   Yes [provider]  magnesium oxide (MAG-OX) 400 MG tablet Take 400 mg by mouth daily.   Yes [provider]  Melatonin 3 MG TABS Take 3 mg by mouth at bedtime.    Yes [provider]  metFORMIN (GLUCOPHAGE-XR) 750 MG 24 hr tablet Take 750 mg by mouth daily.   Yes [provider]  oxyCODONE (OXY IR/ROXICODONE) 5 MG immediate release tablet Take 1 tablet (5 mg total) by mouth every 6 (six) hours as needed for moderate pain or severe pain. Patient taking differently: Take 5 mg by mouth every 4 (four) hours as needed for moderate pain or severe pain.  09/07/18  Yes [name], MD  pantoprazole (PROTONIX) 40 MG tablet Take 40 mg by mouth daily.   Yes [provider]  Pedialyte (PEDIALYTE) SOLN Take 1,000 mLs by mouth daily as needed (electrolyte replacement).   Yes [provider]  polyethylene glycol (MIRALAX / GLYCOLAX) packet Take 17 g by mouth daily as needed for mild constipation or moderate constipation.    Yes [provider]  PROAIR HFA 108 (90 Base) MCG/ACT inhaler Inhale 2 puffs into the lungs every 6 (six) hours as needed for wheezing or shortness of breath.  10/02/17  Yes [provider]  Rivaroxaban (XARELTO) 15 MG TABS tablet Take 15 mg by  mouth daily.   Yes [provider]  rOPINIRole (REQUIP) 0.5 MG tablet Take 0.5 mg by mouth at bedtime.    Yes [provider]  senna (SENOKOT) 8.6 MG TABS tablet Take 1 tablet (8.6 mg total) by mouth daily. Patient taking differently: Take 1 tablet by mouth 2 (two) times a day.  07/10/18  Yes Gladstone Lighter, MD  spironolactone (ALDACTONE) 25 MG tablet Take 0.5 tablets (12.5 mg total) by mouth daily. 05/18/18  Yes Salary, Avel Peace, MD  traZODone (DESYREL) 50 MG tablet Take 50 mg by mouth at bedtime.    Yes [provider]  vitamin B-12 (CYANOCOBALAMIN) 1000 MCG tablet Take 1,000 mcg by  mouth daily.   Yes [provider]  vitamin C (ASCORBIC ACID) 500 MG tablet Take 500 mg by mouth daily.   Yes [provider]  Vitamin D, Ergocalciferol, (DRISDOL) 50000 UNITS CAPS capsule Take 50,000 Units by mouth every Thursday.    Yes [provider]  acetaminophen (TYLENOL) 325 MG tablet Take 2 tablets (650 mg total) by mouth every 6 (six) hours as needed for mild pain (or Fever >/= 101). Patient not taking: Reported on 11/13/2018 04/18/17   Nicholes Mango, MD  doxycycline (VIBRA-TABS) 100 MG tablet Take 1 tablet (100 mg total) by mouth every 12 (twelve) hours. 09/07/18   [name], MD  furosemide (LASIX) 40 MG tablet Take 1 tablet (40 mg total) by mouth 2 (two) times daily. Patient not taking: Reported on 11/13/2018 07/09/18   Gladstone Lighter, MD    Allergies Ceftriaxone; Cefuroxime; Cephalosporins; Codeine; Epinephrine; Morphine and related; Buprenorphine hcl; Ciprofloxacin; Latex; Lidocaine; and Procaine  Family History  Problem Relation Age of Onset   CVA Mother        Congestive Heart Failure, heart attack, hypertension, stroke   Diabetes Mellitus II Sister    Heart disease Father        Congestive Heart Failure, heart attack, hypertension    Social History Social History   Tobacco Use   Smoking status: Never Smoker   Smokeless  tobacco: Never Used  Substance Use Topics   Alcohol use: No    Alcohol/week: 0.0 standard drinks   Drug use: No    Review of Systems  Constitutional: No fever/chills Eyes: No visual changes. ENT: No sore throat. Cardiovascular: Denies chest pain. Respiratory: Denies shortness of breath E. Gastrointestinal: No abdominal pain.  No nausea, no vomiting.  No diarrhea.  No constipation. Genitourinary: Negative for dysuria. Musculoskeletal: Negative for back pain. Skin: Negative for rash. Neurological: Negative for headaches, focal weakness   ____________________________________________   PHYSICAL EXAM:  VITAL SIGNS: ED Triage Vitals  Enc Vitals Group     BP 11/13/18 1609 137/71     Pulse Rate 11/13/18 1609 72     Resp 11/13/18 1609 (!) 22     Temp 11/13/18 1609 98.3 F (36.8 C)     Temp Source 11/13/18 1609 Oral     SpO2 11/13/18 1609 100 %     Weight 11/13/18 1613 174 lb (78.9 kg)     Height 11/13/18 1613 5\' 7"  (1.702 m)     Head Circumference --      Peak Flow --      Pain Score 11/13/18 1613 0     Pain Loc --      Pain Edu? --      Excl. in [place]? --     Constitutional: Alert and oriented. Well appearing and in no acute distress. Eyes: Conjunctivae are normal. PERRL. EOMI. Head: Atraumatic except for a 1 bandage which is covering a bloody spot about the size of a fingernail on the left forehead and another one in the right cheek. Nose: No congestion/rhinnorhea. Mouth/Throat: Mucous membranes are moist.  Oropharynx non-erythematous. Neck: No stridor. Cardiovascular: Normal rate, regular rhythm. Grossly normal heart sounds.  Good peripheral circulation. Respiratory: Normal respiratory effort.  No retractions. Lungs CTAB. Gastrointestinal: Soft and nontender. No distention. No abdominal bruits. No CVA tenderness. Musculoskeletal: No lower extremity tenderness bilateral edema.  Neurologic:  Normal speech and language. No gross focal neurologic deficits are appreciated.  . Skin: Is going to the wound clinic for wounds on both legs.  These are dressed that she just came from the wound clinic 2 days ago. Chest is bruised fairly extensively.  Apparently she fell yesterday had not been read.  ____________________________________________   LABS (all labs ordered are listed, but only abnormal results are displayed)  Labs Reviewed  COMPREHENSIVE METABOLIC PANEL - Abnormal; Notable for the following components:      Result Value   Glucose, Bld 153 (*)    BUN 27 (*)    Creatinine, Ser 1.28 (*)    Calcium 8.8 (*)    Total Protein 6.4 (*)    Albumin 3.1 (*)    GFR calc non Af Amer 37 (*)    GFR calc Af Amer 42 (*)    All other components within normal limits  BRAIN NATRIURETIC PEPTIDE - Abnormal; Notable for the following components:   B Natriuretic Peptide 1,266.0 (*)    All other components within normal limits  TROPONIN I - Abnormal; Notable for the following components:   Troponin I 0.05 (*)    All other components within normal limits  CBC WITH DIFFERENTIAL/PLATELET - Abnormal; Notable for the following components:   RBC 3.44 (*)    Hemoglobin 10.0 (*)    HCT 33.7 (*)    MCHC 29.7 (*)    RDW 19.8 (*)    Monocytes Absolute 1.2 (*)    All other components within normal limits  TROPONIN I - Abnormal; Notable for the following components:   Troponin I 0.05 (*)    All other components within normal limits  SARS CORONAVIRUS 2 (HOSPITAL ORDER, [place] [facility])   ____________________________________________  EKG EKG read interpreted by me shows a fully paced rhythm.  No apparent ST-T changes.  Rate is 75.  ____________________________________________  RADIOLOGY  ED MD interpretation: CTs of the head neck chest and abdomen were negative.  Does not appear to be anything going on with the bruising.  She does have CHF.  Official radiology report(s): Ct Head Wo Contrast  Result Date: 11/13/2018 CLINICAL DATA:  Fall last night  with head injury. EXAM: CT HEAD WITHOUT CONTRAST CT CERVICAL SPINE WITHOUT CONTRAST TECHNIQUE: Multidetector CT imaging of the head and cervical spine was performed following the standard protocol without intravenous contrast. Multiplanar CT image reconstructions of the cervical spine were also generated. COMPARISON:  Cervical spine CT 09/02/2018. Head CT 01/10/2018. FINDINGS: CT HEAD FINDINGS Brain: There is no evidence of acute infarct, intracranial hemorrhage, mass, midline shift, or extra-axial fluid collection. The ventricles and sulci are normal. Minimal hypoattenuation in the cerebral white matter is unchanged and not considered abnormal for age. Vascular: Calcified atherosclerosis at the skull base. No hyperdense vessel. Skull: No fracture or focal osseous lesion. Sinuses/Orbits: Visualized paranasal sinuses and mastoid air cells are clear. Bilateral cataract extraction is noted. Other: None. CT CERVICAL SPINE FINDINGS The study is mildly motion degraded despite repeat imaging through the upper cervical spine. Alignment: 2 mm anterolisthesis of C4 on C5, stable to minimally increased and likely degenerative. Skull base and vertebrae: No acute fracture is identified within limitations of motion artifact. No destructive osseous lesion. Soft tissues and spinal canal: No prevertebral fluid or swelling. No visible canal hematoma. Disc levels: Mild disc and mild-to-moderate facet degeneration in the cervical spine. Upper chest: Respiratory motion artifact through the upper lobes with asymmetric ground-glass/mosaic attenuation on the right, also present on the prior spine CT. 1 cm focal consolidative opacity in the right apex. The lungs are more fully evaluated on today's chest  radiograph. Other: Carotid artery atherosclerosis. IMPRESSION: 1. No evidence of acute intracranial abnormality. 2. Motion degraded cervical spine CT without evidence of acute fracture. Electronically Signed   By: Logan Bores M.D.   On:  11/13/2018 17:09   Ct Cervical Spine Wo Contrast  Result Date: 11/13/2018 CLINICAL DATA:  Fall last night with head injury. EXAM: CT HEAD WITHOUT CONTRAST CT CERVICAL SPINE WITHOUT CONTRAST TECHNIQUE: Multidetector CT imaging of the head and cervical spine was performed following the standard protocol without intravenous contrast. Multiplanar CT image reconstructions of the cervical spine were also generated. COMPARISON:  Cervical spine CT 09/02/2018. Head CT 01/10/2018. FINDINGS: CT HEAD FINDINGS Brain: There is no evidence of acute infarct, intracranial hemorrhage, mass, midline shift, or extra-axial fluid collection. The ventricles and sulci are normal. Minimal hypoattenuation in the cerebral white matter is unchanged and not considered abnormal for age. Vascular: Calcified atherosclerosis at the skull base. No hyperdense vessel. Skull: No fracture or focal osseous lesion. Sinuses/Orbits: Visualized paranasal sinuses and mastoid air cells are clear. Bilateral cataract extraction is noted. Other: None. CT CERVICAL SPINE FINDINGS The study is mildly motion degraded despite repeat imaging through the upper cervical spine. Alignment: 2 mm anterolisthesis of C4 on C5, stable to minimally increased and likely degenerative. Skull base and vertebrae: No acute fracture is identified within limitations of motion artifact. No destructive osseous lesion. Soft tissues and spinal canal: No prevertebral fluid or swelling. No visible canal hematoma. Disc levels: Mild disc and mild-to-moderate facet degeneration in the cervical spine. Upper chest: Respiratory motion artifact through the upper lobes with asymmetric ground-glass/mosaic attenuation on the right, also present on the prior spine CT. 1 cm focal consolidative opacity in the right apex. The lungs are more fully evaluated on today's chest radiograph. Other: Carotid artery atherosclerosis. IMPRESSION: 1. No evidence of acute intracranial abnormality. 2. Motion degraded  cervical spine CT without evidence of acute fracture. Electronically Signed   By: Logan Bores M.D.   On: 11/13/2018 17:09   Dg Chest Portable 1 View  Result Date: 11/13/2018 CLINICAL DATA:  Shortness of breath. History of COPD. EXAM: PORTABLE CHEST 1 VIEW COMPARISON:  09/02/2018 FINDINGS: A dual lead pacemaker remains in place. The cardiomediastinal silhouette is unchanged allowing for leftward patient rotation on today's study with cardiomegaly and aortic atherosclerosis again noted. The interstitial markings remain mildly increased diffusely with mild central pulmonary vascular congestion. No airspace consolidation, pleural effusion, pneumothorax is identified. Prior posterior thoracolumbar spinal fusion is noted. IMPRESSION: Cardiomegaly with suspected mild interstitial edema. Electronically Signed   By: Logan Bores M.D.   On: 11/13/2018 16:56    ____________________________________________   PROCEDURES  Procedure(s) performed (including Critical Care):  Procedures   ____________________________________________   INITIAL IMPRESSION / ASSESSMENT AND PLAN / ED COURSE   Roquel Burgin was evaluated in Emergency Department on 11/13/2018 for the symptoms described in the history of present illness. She was evaluated in the context of the global COVID-19 pandemic, which necessitated consideration that the patient might be at risk for infection with the SARS-CoV-2 virus that causes COVID-19. Institutional protocols and algorithms that pertain to the evaluation of patients at risk for COVID-19 are in a state of rapid change based on information released by regulatory bodies including the CDC and federal and state organizations. These policies and algorithms were followed during the patient's care in the ED.    Patient's troponin is stable.  Her GFR is down from prior she is still short of breath has had no  response to the Lasix.  She will have to come in the hospital.          ____________________________________________   FINAL CLINICAL IMPRESSION(S) / ED DIAGNOSES  Final diagnoses:  SOB (shortness of breath)  Hypoxia  Congestive heart failure, unspecified HF chronicity, unspecified heart failure type ([place])  AKI (acute kidney injury) [facility])     ED Discharge Orders    None       Note:  This document was prepared using Dragon voice recognition software and may include unintentional dictation errors.    Nena Polio, MD 11/13/18 2132

## 2018-11-13 NOTE — ED Notes (Signed)
Pts depends changed and skin cleaned. PT repositioned and call bell in place. Bed locked and in the lowest position. Pt reporting she is hungry and thirsty and requesting her nightly pain medications. MD made ware.

## 2018-11-13 NOTE — ED Notes (Signed)
Patient transported to CT 

## 2018-11-13 NOTE — ED Notes (Signed)
Report given to Shannon RN

## 2018-11-13 NOTE — ED Notes (Signed)
Report given to April, RN

## 2018-11-14 ENCOUNTER — Inpatient Hospital Stay
Admit: 2018-11-14 | Discharge: 2018-11-14 | Disposition: A | Discharge status: Home / Self Care | Payer: Medicare Other | Attending: Nurse Practitioner | Admitting: Nurse Practitioner

## 2018-11-14 ENCOUNTER — Inpatient Hospital Stay: Payer: Medicare Other

## 2018-11-14 ENCOUNTER — Other Ambulatory Visit: Payer: Self-pay

## 2018-11-14 LAB — ECHOCARDIOGRAM COMPLETE
Height: 67 in
Weight: 2961.6 oz

## 2018-11-14 LAB — GLUCOSE, CAPILLARY
Glucose-Capillary: 106 mg/dL — ABNORMAL HIGH (ref 70–99)
Glucose-Capillary: 106 mg/dL — ABNORMAL HIGH (ref 70–99)
Glucose-Capillary: 186 mg/dL — ABNORMAL HIGH (ref 70–99)
Glucose-Capillary: 185 mg/dL — ABNORMAL HIGH (ref 70–99)
Glucose-Capillary: 163 mg/dL — ABNORMAL HIGH (ref 70–99)

## 2018-11-14 LAB — BASIC METABOLIC PANEL
Anion gap: 10 (ref 5–15)
BUN: 30 mg/dL — ABNORMAL HIGH (ref 8–23)
CO2: 31 mmol/L (ref 22–32)
Calcium: 8.7 mg/dL — ABNORMAL LOW (ref 8.9–10.3)
Chloride: 100 mmol/L (ref 98–111)
Creatinine, Ser: 1.25 mg/dL — ABNORMAL HIGH (ref 0.44–1.00)
GFR calc Af Amer: 44 mL/min — ABNORMAL LOW (ref >60)
GFR calc non Af Amer: 38 mL/min — ABNORMAL LOW (ref >60)
Glucose, Bld: 99 mg/dL (ref 70–99)
Potassium: 4.2 mmol/L (ref 3.5–5.1)
Sodium: 141 mmol/L (ref 135–145)

## 2018-11-14 LAB — HEMOGLOBIN A1C
Hgb A1c MFr Bld: 7.8 % — ABNORMAL HIGH (ref 4.8–5.6)
Mean Plasma Glucose: 177.16 mg/dL

## 2018-11-14 LAB — TROPONIN I
Troponin I: 0.04 ng/mL (ref <0.03)
Troponin I: 0.05 ng/mL (ref <0.03)
Troponin I: 0.05 ng/mL (ref <0.03)

## 2018-11-14 LAB — MRSA PCR SCREENING: MRSA by PCR: NEGATIVE

## 2018-11-14 MED ORDER — TRAMADOL HCL 50 MG PO TABS
50.0000 mg | ORAL_TABLET | Freq: Four times a day (QID) | ORAL | Status: DC | PRN
  Administered 2018-11-14 – 2018-11-15 (×4): 50 mg via ORAL
  Filled 2018-11-14 (×6): qty 1

## 2018-11-14 MED ORDER — FUROSEMIDE 10 MG/ML IJ SOLN
80.0000 mg | Freq: Two times a day (BID) | INTRAMUSCULAR | Status: DC
  Administered 2018-11-14 (×2): 80 mg via INTRAVENOUS
  Filled 2018-11-14 (×2): qty 8

## 2018-11-14 NOTE — Progress Notes (Signed)
PT Cancellation Note  Patient Details [name] MRN: [id number] DOB: 28-Apr-1925   Cancelled Treatment:    Reason Eval/Treat Not Completed: Patient declined, no reason specified.  Pt reporting just getting back to bed and declined physical therapy (nurse also reported pt had just returned to bed from chair and had been up to the chair for a while today).  Will re-attempt PT evaluation at a later date/time.  Leitha Bleak, PT 11/14/18, 3:28 PM [phone]

## 2018-11-14 NOTE — H&P (Signed)
[facility] [name]    MR#:  [id number]  DATE OF BIRTH:  1924-12-16  DATE OF ADMISSION:  11/13/2018  PRIMARY CARE PHYSICIAN: Tracie Harrier, MD   REQUESTING/REFERRING PHYSICIAN: Conni Slipper, MD  CHIEF COMPLAINT:   Chief Complaint  Patient presents with   Shortness of Breath    HISTORY OF PRESENT ILLNESS:  [name]  is a 85 y.o. female with a known history of CHF, diabetes mellitus, COPD, peripheral vascular disease.  Patient was sent to the emergency room from assisted living home for decreased oxygen saturation.  Oxygen saturation is 80% on room air however improves to greater than 96% on 2 L oxygen per nasal cannula.  Patient reports she usually wears oxygen at the nursing home.  Patient tells me she fell yesterday hitting her head and her left hip.  Patient is uncertain whether or not she experience loss of consciousness.  She denies dizziness prior to the fall.  She has an 8 cm abrasion to her left forehead and also a moderate to large size area of ecchymosis to her right posterior thoracic area.  Patient remains afebrile since arrival.  She denies recent fevers or chills.  No nausea or vomiting.  Patient denies chest pain.  She denies dysuria.  Patient has noticed increased shortness of breath over the last 2 days.  She denies cough.  CT head and spine were completed demonstrating no acute intracranial abnormality and no evidence of fracture.  Chest x-ray showed suspected mild interstitial edema.  She has been admitted to the hospitalist service for further management.  PAST MEDICAL HISTORY:   Past Medical History:  Diagnosis Date   Cancer ([place])    skin ca   CHF (congestive heart failure) (HCC)    Chronic back pain    COPD (chronic obstructive pulmonary disease) (HCC)    Diabetes mellitus without complication ([place])    Hypertension    Pacemaker    2015   Poor perfusion of leg     PAST SURGICAL  HISTORY:   Past Surgical History:  Procedure Laterality Date   ABDOMINAL HYSTERECTOMY     BACK SURGERY     x3   CORONARY STENT PLACEMENT     unknown location per pt   FOOT SURGERY     LEG SURGERY     PACEMAKER PLACEMENT     TONSILLECTOMY      SOCIAL HISTORY:   Social History   Tobacco Use   Smoking status: Never Smoker   Smokeless tobacco: Never Used  Substance Use Topics   Alcohol use: No    Alcohol/week: 0.0 standard drinks    FAMILY HISTORY:   Family History  Problem Relation Age of Onset   CVA Mother        Congestive Heart Failure, heart attack, hypertension, stroke   Diabetes Mellitus II Sister    Heart disease Father        Congestive Heart Failure, heart attack, hypertension    DRUG ALLERGIES:   Allergies  Allergen Reactions   Ceftriaxone Swelling    Tolerates piperacillin/tazobactam, PCN G   Cefuroxime Hives    Tolerates piperacillin/tazobactam, PCN G   Cephalosporins Swelling and Other (See Comments)    Reaction:  Fainting/dry mouth  Tolerates piperacillin/tazobactam, PCN G   Codeine Nausea And Vomiting   Epinephrine Other (See Comments)    Reaction:  Fainting    Morphine And Related Other (See Comments)  Reaction:  GI upset    Buprenorphine Hcl Nausea And Vomiting   Ciprofloxacin Itching, Swelling and Rash   Latex Rash   Lidocaine Rash   Procaine Rash    REVIEW OF SYSTEMS:   Review of Systems  Constitutional: Negative for chills and fever.  HENT: Negative for congestion, sinus pain and sore throat.   Eyes: Negative for blurred vision and double vision.  Respiratory: Positive for shortness of breath. Negative for cough, sputum production and wheezing.   Cardiovascular: Negative for chest pain and palpitations.  Gastrointestinal: Negative for abdominal pain, diarrhea, nausea and vomiting.  Genitourinary: Negative for dysuria, flank pain and hematuria.  Musculoskeletal: Positive for falls and joint pain.  Skin:  Negative for itching and rash.  Neurological: Positive for weakness. Negative for dizziness, loss of consciousness and headaches.  Psychiatric/Behavioral: Negative.       MEDICATIONS AT HOME:   Prior to Admission medications   Medication Sig Start Date End Date Taking? Authorizing Provider  acetaminophen (TYLENOL) 650 MG CR tablet Take 650 mg by mouth every 8 (eight) hours.   Yes [provider]  aspirin EC 81 MG tablet Take 81 mg by mouth daily.   Yes [provider]  baclofen (LIORESAL) 10 MG tablet Take 5 mg by mouth 3 (three) times daily as needed for muscle spasms.    Yes [provider]  buPROPion (WELLBUTRIN XL) 300 MG 24 hr tablet Take 300 mg by mouth daily.  11/28/17  Yes [provider]  busPIRone (BUSPAR) 5 MG tablet Take 5 mg by mouth 2 (two) times daily.   Yes [provider]  carvedilol (COREG) 6.25 MG tablet Take 6.25 mg by mouth 2 (two) times daily with a meal.    Yes [provider]  cholecalciferol (VITAMIN D) 1000 units tablet Take 2,000 Units by mouth daily.    Yes [provider]  docusate sodium (COLACE) 100 MG capsule Take 100 mg by mouth 2 (two) times daily.    Yes [provider]  escitalopram (LEXAPRO) 10 MG tablet Take 10 mg by mouth daily.   Yes [provider]  ferrous sulfate 325 (65 FE) MG tablet Take 325 mg by mouth daily with breakfast.   Yes [provider]  fexofenadine (ALLEGRA) 60 MG tablet Take 60 mg by mouth 2 (two) times daily.   Yes [provider]  furosemide (LASIX) 80 MG tablet Take 80 mg by mouth 2 (two) times daily.   Yes [provider]  gabapentin (NEURONTIN) 100 MG capsule Take 100-200 mg by mouth at bedtime. Take 1 capsule (100mg ) by mouth every night at bedtime and 1 additional capsule (100mg ) by mouth at bedtime as needed for leg pain   Yes [provider]  insulin glargine (LANTUS) 100 UNIT/ML injection Inject 0.2 mLs (20 Units  total) into the skin at bedtime. Patient taking differently: Inject 10 Units into the skin at bedtime.  09/07/18  Yes [name], MD  ipratropium-albuterol (DUONEB) 0.5-2.5 (3) MG/3ML SOLN Take 3 mLs by nebulization every 6 (six) hours as needed (wheezing). Patient taking differently: Take 3 mLs by nebulization every 6 (six) hours as needed (shortness of breath).  07/09/18  Yes Gladstone Lighter, MD  loperamide (IMODIUM) 2 MG capsule Take 2-4 mg by mouth See admin instructions. Take 2 capsules (4mg ) by mouth after initial loose stool and take 1 capsule (2mg ) by mouth after second loose stool   Yes [provider]  magnesium oxide (MAG-OX) 400 MG tablet  Take 400 mg by mouth daily.   Yes [provider]  Melatonin 3 MG TABS Take 3 mg by mouth at bedtime.    Yes [provider]  metFORMIN (GLUCOPHAGE-XR) 750 MG 24 hr tablet Take 750 mg by mouth daily.   Yes [provider]  oxyCODONE (OXY IR/ROXICODONE) 5 MG immediate release tablet Take 1 tablet (5 mg total) by mouth every 6 (six) hours as needed for moderate pain or severe pain. Patient taking differently: Take 5 mg by mouth every 4 (four) hours as needed for moderate pain or severe pain.  09/07/18  Yes [name], MD  pantoprazole (PROTONIX) 40 MG tablet Take 40 mg by mouth daily.   Yes [provider]  Pedialyte (PEDIALYTE) SOLN Take 1,000 mLs by mouth daily as needed (electrolyte replacement).   Yes [provider]  polyethylene glycol (MIRALAX / GLYCOLAX) packet Take 17 g by mouth daily as needed for mild constipation or moderate constipation.    Yes [provider]  PROAIR HFA 108 (90 Base) MCG/ACT inhaler Inhale 2 puffs into the lungs every 6 (six) hours as needed for wheezing or shortness of breath.  10/02/17  Yes [provider]  Rivaroxaban (XARELTO) 15 MG TABS tablet Take 15 mg by mouth daily.   Yes [provider]  rOPINIRole (REQUIP) 0.5 MG tablet Take 0.5 mg by  mouth at bedtime.    Yes [provider]  senna (SENOKOT) 8.6 MG TABS tablet Take 1 tablet (8.6 mg total) by mouth daily. Patient taking differently: Take 1 tablet by mouth 2 (two) times a day.  07/10/18  Yes Gladstone Lighter, MD  spironolactone (ALDACTONE) 25 MG tablet Take 0.5 tablets (12.5 mg total) by mouth daily. 05/18/18  Yes Salary, Avel Peace, MD  traZODone (DESYREL) 50 MG tablet Take 50 mg by mouth at bedtime.    Yes [provider]  vitamin B-12 (CYANOCOBALAMIN) 1000 MCG tablet Take 1,000 mcg by mouth daily.   Yes [provider]  vitamin C (ASCORBIC ACID) 500 MG tablet Take 500 mg by mouth daily.   Yes [provider]  Vitamin D, Ergocalciferol, (DRISDOL) 50000 UNITS CAPS capsule Take 50,000 Units by mouth every Thursday.    Yes [provider]  acetaminophen (TYLENOL) 325 MG tablet Take 2 tablets (650 mg total) by mouth every 6 (six) hours as needed for mild pain (or Fever >/= 101). Patient not taking: Reported on 11/13/2018 04/18/17   Nicholes Mango, MD  doxycycline (VIBRA-TABS) 100 MG tablet Take 1 tablet (100 mg total) by mouth every 12 (twelve) hours. 09/07/18   [name], MD  furosemide (LASIX) 40 MG tablet Take 1 tablet (40 mg total) by mouth 2 (two) times daily. Patient not taking: Reported on 11/13/2018 07/09/18   Gladstone Lighter, MD      VITAL SIGNS:  Blood pressure (!) 153/72, pulse 73, temperature 97.6 F (36.4 C), temperature source Oral, resp. rate 18, height 5\' 7"  (1.702 m), weight 78.9 kg, SpO2 100 %.  PHYSICAL EXAMINATION:  Physical Exam Vitals signs and nursing note reviewed.  Constitutional:      General: She is not in acute distress.    Appearance: She is well-developed. She is ill-appearing. She is not diaphoretic.  HENT:     Head: Normocephalic.     Mouth/Throat:     Mouth: Mucous membranes are moist.     Pharynx: Oropharynx is clear.  Eyes:     Extraocular Movements: Extraocular movements intact.  Pupils: Pupils are equal, round, and reactive to light.  Neck:     Musculoskeletal: Normal range of motion and neck supple.     Vascular: No JVD.  Cardiovascular:     Rate and Rhythm: Normal rate and regular rhythm.     Pulses: Normal pulses.     Heart sounds: Normal heart sounds.  Pulmonary:     Effort: Pulmonary effort is normal. No tachypnea or bradypnea.     Breath sounds: No decreased breath sounds, wheezing, rhonchi or rales.  Chest:     Chest wall: No tenderness.  Abdominal:     Palpations: There is no mass.     Tenderness: There is no guarding or rebound.  Musculoskeletal:     Right lower leg: No edema.     Left lower leg: No edema.  Skin:    General: Skin is warm and dry.     Capillary Refill: Capillary refill takes less than 2 seconds.     Findings: No rash.     Comments: Left anterior lower extremity wound - Right posterior lower extremity wound  Neurological:     General: No focal deficit present.     Mental Status: She is alert and oriented to person, place, and time.  Psychiatric:        Mood and Affect: Mood normal.        Behavior: Behavior normal.       LABORATORY PANEL:   CBC Recent Labs  Lab 11/13/18 1626  WBC 8.8  HGB 10.0*  HCT 33.7*  PLT 157   ------------------------------------------------------------------------------------------------------------------  Chemistries  Recent Labs  Lab 11/13/18 1626  NA 140  K 4.1  CL 99  CO2 32  GLUCOSE 153*  BUN 27*  CREATININE 1.28*  CALCIUM 8.8*  AST 35  ALT 22  ALKPHOS 91  BILITOT 1.0   ------------------------------------------------------------------------------------------------------------------  Cardiac Enzymes Recent Labs  Lab 11/14/18 0007  TROPONINI 0.05*   ------------------------------------------------------------------------------------------------------------------  RADIOLOGY:  Ct Abdomen Pelvis Wo Contrast  Result Date: 11/13/2018 CLINICAL DATA:  Pain status post  fall EXAM: CT CHEST, ABDOMEN AND PELVIS WITHOUT CONTRAST TECHNIQUE: Multidetector CT imaging of the chest, abdomen and pelvis was performed following the standard protocol without IV contrast. COMPARISON:  06/29/2018 FINDINGS: CT CHEST FINDINGS Cardiovascular: The heart is enlarged. The main pulmonary artery is dilated measuring up to 3.3 cm. Advanced coronary and aortic calcifications are noted. There is a dual chamber left-sided pacemaker in place. Mediastinum/Nodes: Multiple prominent lymph nodes are noted. There are no pathologically enlarged axillary or supraclavicular lymph nodes. Lungs/Pleura: There is bilateral interlobular septal thickening with ground-glass appearance of the upper lobes. There are small bilateral pleural effusions, right greater than left. There are few scattered additional ground-glass opacities. For example there is a 1 cm ground-glass opacity in the right middle lobe (axial series 6, image 83). There is atelectasis at the lung bases. No pneumothorax. Musculoskeletal: No chest wall mass or suspicious bone lesions identified. CT ABDOMEN PELVIS FINDINGS Hepatobiliary: The liver is unremarkable. There is cholelithiasis without CT evidence of acute cholecystitis. The common bile duct is poorly evaluated secondary to motion artifact. Pancreas: Unremarkable. No pancreatic ductal dilatation or surrounding inflammatory changes. Spleen: Normal in size without focal abnormality. Adrenals/Urinary Tract: Adrenal glands are unremarkable. Kidneys are normal, without renal calculi, focal lesion, or hydronephrosis. Right-sided renal cysts are again noted. Bladder is unremarkable. Stomach/Bowel: There are scattered colonic diverticula without CT evidence of diverticulitis. There is a wide-mouth ventral wall hernia containing a loop of the  transverse colon without evidence of an obstruction. The patient appears to be status post prior appendectomy. There is no evidence of a small-bowel obstruction.  Vascular/Lymphatic: Aortic atherosclerosis. No enlarged abdominal or pelvic lymph nodes. Reproductive: Status post hysterectomy. No adnexal masses. There is a small amount of free fluid in the pelvis. Other: There is a small volume of ascites, most notably in the upper abdomen. Musculoskeletal: Extensive posterior fusion hardware is again noted. There is stable height loss of the T11 vertebral body and the L2 vertebral bodies. The hardware appears intact without evidence of a fracture. IMPRESSION: 1. Cardiomegaly with findings concerning for pulmonary edema. There are small bilateral pleural effusions. 2. There is a 1 cm ground-glass opacity in the right middle lobe that is favored to be secondary to pulmonary edema. A three-month follow-up CT of the chest is recommended to confirm resolution of this finding. 3. No acute intra-abdominal abnormality detected. 4. No displaced fractures, however fracture detection is limited by diffuse osteopenia. 5. Cholelithiasis without CT evidence of acute cholecystitis. 6. Trace abdominal ascites. Aortic Atherosclerosis (ICD10-I70.0). Electronically Signed   By: Constance Holster M.D.   On: 11/13/2018 20:56   Ct Head Wo Contrast  Result Date: 11/13/2018 CLINICAL DATA:  Fall last night with head injury. EXAM: CT HEAD WITHOUT CONTRAST CT CERVICAL SPINE WITHOUT CONTRAST TECHNIQUE: Multidetector CT imaging of the head and cervical spine was performed following the standard protocol without intravenous contrast. Multiplanar CT image reconstructions of the cervical spine were also generated. COMPARISON:  Cervical spine CT 09/02/2018. Head CT 01/10/2018. FINDINGS: CT HEAD FINDINGS Brain: There is no evidence of acute infarct, intracranial hemorrhage, mass, midline shift, or extra-axial fluid collection. The ventricles and sulci are normal. Minimal hypoattenuation in the cerebral white matter is unchanged and not considered abnormal for age. Vascular: Calcified atherosclerosis at the  skull base. No hyperdense vessel. Skull: No fracture or focal osseous lesion. Sinuses/Orbits: Visualized paranasal sinuses and mastoid air cells are clear. Bilateral cataract extraction is noted. Other: None. CT CERVICAL SPINE FINDINGS The study is mildly motion degraded despite repeat imaging through the upper cervical spine. Alignment: 2 mm anterolisthesis of C4 on C5, stable to minimally increased and likely degenerative. Skull base and vertebrae: No acute fracture is identified within limitations of motion artifact. No destructive osseous lesion. Soft tissues and spinal canal: No prevertebral fluid or swelling. No visible canal hematoma. Disc levels: Mild disc and mild-to-moderate facet degeneration in the cervical spine. Upper chest: Respiratory motion artifact through the upper lobes with asymmetric ground-glass/mosaic attenuation on the right, also present on the prior spine CT. 1 cm focal consolidative opacity in the right apex. The lungs are more fully evaluated on today's chest radiograph. Other: Carotid artery atherosclerosis. IMPRESSION: 1. No evidence of acute intracranial abnormality. 2. Motion degraded cervical spine CT without evidence of acute fracture. Electronically Signed   By: Logan Bores M.D.   On: 11/13/2018 17:09   Ct Chest Wo Contrast  Result Date: 11/13/2018 CLINICAL DATA:  Pain status post fall EXAM: CT CHEST, ABDOMEN AND PELVIS WITHOUT CONTRAST TECHNIQUE: Multidetector CT imaging of the chest, abdomen and pelvis was performed following the standard protocol without IV contrast. COMPARISON:  06/29/2018 FINDINGS: CT CHEST FINDINGS Cardiovascular: The heart is enlarged. The main pulmonary artery is dilated measuring up to 3.3 cm. Advanced coronary and aortic calcifications are noted. There is a dual chamber left-sided pacemaker in place. Mediastinum/Nodes: Multiple prominent lymph nodes are noted. There are no pathologically enlarged axillary or supraclavicular lymph nodes.  Lungs/Pleura: There is bilateral interlobular septal thickening with ground-glass appearance of the upper lobes. There are small bilateral pleural effusions, right greater than left. There are few scattered additional ground-glass opacities. For example there is a 1 cm ground-glass opacity in the right middle lobe (axial series 6, image 83). There is atelectasis at the lung bases. No pneumothorax. Musculoskeletal: No chest wall mass or suspicious bone lesions identified. CT ABDOMEN PELVIS FINDINGS Hepatobiliary: The liver is unremarkable. There is cholelithiasis without CT evidence of acute cholecystitis. The common bile duct is poorly evaluated secondary to motion artifact. Pancreas: Unremarkable. No pancreatic ductal dilatation or surrounding inflammatory changes. Spleen: Normal in size without focal abnormality. Adrenals/Urinary Tract: Adrenal glands are unremarkable. Kidneys are normal, without renal calculi, focal lesion, or hydronephrosis. Right-sided renal cysts are again noted. Bladder is unremarkable. Stomach/Bowel: There are scattered colonic diverticula without CT evidence of diverticulitis. There is a wide-mouth ventral wall hernia containing a loop of the transverse colon without evidence of an obstruction. The patient appears to be status post prior appendectomy. There is no evidence of a small-bowel obstruction. Vascular/Lymphatic: Aortic atherosclerosis. No enlarged abdominal or pelvic lymph nodes. Reproductive: Status post hysterectomy. No adnexal masses. There is a small amount of free fluid in the pelvis. Other: There is a small volume of ascites, most notably in the upper abdomen. Musculoskeletal: Extensive posterior fusion hardware is again noted. There is stable height loss of the T11 vertebral body and the L2 vertebral bodies. The hardware appears intact without evidence of a fracture. IMPRESSION: 1. Cardiomegaly with findings concerning for pulmonary edema. There are small bilateral pleural  effusions. 2. There is a 1 cm ground-glass opacity in the right middle lobe that is favored to be secondary to pulmonary edema. A three-month follow-up CT of the chest is recommended to confirm resolution of this finding. 3. No acute intra-abdominal abnormality detected. 4. No displaced fractures, however fracture detection is limited by diffuse osteopenia. 5. Cholelithiasis without CT evidence of acute cholecystitis. 6. Trace abdominal ascites. Aortic Atherosclerosis (ICD10-I70.0). Electronically Signed   By: Constance Holster M.D.   On: 11/13/2018 20:56   Ct Cervical Spine Wo Contrast  Result Date: 11/13/2018 CLINICAL DATA:  Fall last night with head injury. EXAM: CT HEAD WITHOUT CONTRAST CT CERVICAL SPINE WITHOUT CONTRAST TECHNIQUE: Multidetector CT imaging of the head and cervical spine was performed following the standard protocol without intravenous contrast. Multiplanar CT image reconstructions of the cervical spine were also generated. COMPARISON:  Cervical spine CT 09/02/2018. Head CT 01/10/2018. FINDINGS: CT HEAD FINDINGS Brain: There is no evidence of acute infarct, intracranial hemorrhage, mass, midline shift, or extra-axial fluid collection. The ventricles and sulci are normal. Minimal hypoattenuation in the cerebral white matter is unchanged and not considered abnormal for age. Vascular: Calcified atherosclerosis at the skull base. No hyperdense vessel. Skull: No fracture or focal osseous lesion. Sinuses/Orbits: Visualized paranasal sinuses and mastoid air cells are clear. Bilateral cataract extraction is noted. Other: None. CT CERVICAL SPINE FINDINGS The study is mildly motion degraded despite repeat imaging through the upper cervical spine. Alignment: 2 mm anterolisthesis of C4 on C5, stable to minimally increased and likely degenerative. Skull base and vertebrae: No acute fracture is identified within limitations of motion artifact. No destructive osseous lesion. Soft tissues and spinal canal:  No prevertebral fluid or swelling. No visible canal hematoma. Disc levels: Mild disc and mild-to-moderate facet degeneration in the cervical spine. Upper chest: Respiratory motion artifact through the upper lobes with asymmetric ground-glass/mosaic attenuation on the right,  also present on the prior spine CT. 1 cm focal consolidative opacity in the right apex. The lungs are more fully evaluated on today's chest radiograph. Other: Carotid artery atherosclerosis. IMPRESSION: 1. No evidence of acute intracranial abnormality. 2. Motion degraded cervical spine CT without evidence of acute fracture. Electronically Signed   By: Logan Bores M.D.   On: 11/13/2018 17:09   Dg Chest Portable 1 View  Result Date: 11/13/2018 CLINICAL DATA:  Shortness of breath. History of COPD. EXAM: PORTABLE CHEST 1 VIEW COMPARISON:  09/02/2018 FINDINGS: A dual lead pacemaker remains in place. The cardiomediastinal silhouette is unchanged allowing for leftward patient rotation on today's study with cardiomegaly and aortic atherosclerosis again noted. The interstitial markings remain mildly increased diffusely with mild central pulmonary vascular congestion. No airspace consolidation, pleural effusion, pneumothorax is identified. Prior posterior thoracolumbar spinal fusion is noted. IMPRESSION: Cardiomegaly with suspected mild interstitial edema. Electronically Signed   By: Logan Bores M.D.   On: 11/13/2018 16:56   Dg Hip Unilat With Pelvis 2-3 Views Right  Result Date: 11/14/2018 CLINICAL DATA:  Recent falls with right-sided hip pain, initial encounter EXAM: DG HIP (WITH OR WITHOUT PELVIS) 2-3V RIGHT COMPARISON:  None. FINDINGS: Pelvic ring is intact. No acute fracture or dislocation is noted. No soft tissue abnormality is seen. IMPRESSION: No acute abnormality noted. Electronically Signed   By: Inez Catalina M.D.   On: 11/14/2018 00:56      IMPRESSION AND PLAN:   1.  Acute on chronic systolic CHF- most recent ejection fraction  40% - Will change Lasix to IV 80 mg twice daily - Coreg continued - Echocardiogram - Cardiology consulted - Telemetry monitoring - Continue serial troponin levels - Repeat chest x-ray in the a.m. - Oxygen per nasal cannula - Repeat BMP in the a.m. -DuoNeb every 6 hours as needed for shortness of breath  2. fall - No evidence of acute intracranial abnormality or spinal fracture. - Left hip x-ray is pending - Physical therapy consultation for generalized weakness and impaired mobility - Continue every 4 hour neurologic checks  3.  Atrial fibrillation - With history of pacemaker placement - Xarelto continued - Repeat CMP in the a.m. - Patient is on telemetry monitoring  4.  Diabetes mellitus --Moderate sliding scale insulin - Metformin and Lantus insulin have been continued - Hemoglobin A1c pending  All the records are reviewed and case discussed with ED provider. The plan of care was discussed in details with the patient (and family). I answered all questions. The patient agreed to proceed with the above mentioned plan. Further management will depend upon hospital course.   CODE STATUS: Full code  TOTAL TIME TAKING CARE OF THIS PATIENT: 45 minutes.    [place] on 11/14/2018 at 1:40 AM  Pager - [phone]  After 6pm go to [email or web address] - Proofreader  [facility] [place] Hospitalists  Office  [phone]  CC: Primary care physician; Tracie Harrier, MD   Note: This dictation was prepared with Dragon dictation along with smaller phrase technology. Any transcriptional errors that result from this process are unintentional.

## 2018-11-14 NOTE — ED Notes (Signed)
ED TO INPATIENT HANDOFF REPORT  ED Nurse Name and Phone #: Easten Maceachern [phone]   S Name/Age/Gender [name] 85 y.o. female Room/Bed: ED06A/ED06A  Code Status   Code Status: Full Code  Home/SNF/Other Skilled nursing facility Patient oriented to: self, place, time and situation Is this baseline? Yes   Triage Complete: Triage complete  Chief Complaint [facility]  Triage Note Per EMs report, patient lives at [facility] and is on O2 via [place] constantly. Staff states patient was low 80's on room air. Patient arrived on 4L via EMS and was 100%. Dr. Cinda Quest aware.   Allergies Allergies  Allergen Reactions  . Ceftriaxone Swelling    Tolerates piperacillin/tazobactam, PCN G  . Cefuroxime Hives    Tolerates piperacillin/tazobactam, PCN G  . Cephalosporins Swelling and Other (See Comments)    Reaction:  Fainting/dry mouth  Tolerates piperacillin/tazobactam, PCN G  . Codeine Nausea And Vomiting  . Epinephrine Other (See Comments)    Reaction:  Fainting   . Morphine And Related Other (See Comments)    Reaction:  GI upset   . Buprenorphine Hcl Nausea And Vomiting  . Ciprofloxacin Itching, Swelling and Rash  . Latex Rash  . Lidocaine Rash  . Procaine Rash    Level of Care/Admitting Diagnosis ED Disposition    ED Disposition Condition [facility] Area: [place] [[id number]]  Level of Care: Med-Surg [16]  Covid Evaluation: N/A  Diagnosis: Acute on chronic diastolic CHF (congestive heart failure) [facility]) [[id number]]  Admitting Physician: Hyman Bible DODD [[id number]]  Attending Physician: Hyman Bible DODD [[id number]]  Estimated length of stay: past midnight tomorrow  Certification:: I certify this patient will need inpatient services for at least 2 midnights  PT Class (Do Not Modify): Inpatient [101]  PT Acc Code (Do Not Modify): Private [1]       B Medical/Surgery History Past Medical History:  Diagnosis Date  . Cancer ([place])    skin ca  . CHF  (congestive heart failure) ([place])   . Chronic back pain   . COPD (chronic obstructive pulmonary disease) ([place])   . Diabetes mellitus without complication ([place])   . Hypertension   . Pacemaker    2015  . Poor perfusion of leg    Past Surgical History:  Procedure Laterality Date  . ABDOMINAL HYSTERECTOMY    . BACK SURGERY     x3  . CORONARY STENT PLACEMENT     unknown location per pt  . FOOT SURGERY    . LEG SURGERY    . PACEMAKER PLACEMENT    . TONSILLECTOMY       A IV Location/Drains/Wounds Patient Lines/Drains/Airways Status   Active Line/Drains/Airways    Name:   Placement date:   Placement time:   Site:   Days:   Peripheral IV 11/13/18 Right Antecubital   11/13/18    1626    Antecubital   1   Wound / Incision (Open or Dehisced) 09/03/18 Laceration Leg Left   09/03/18    0348    Leg   72          Intake/Output Last 24 hours No intake or output data in the 24 hours ending 11/14/18 0001  Labs/Imaging Results for orders placed or performed during the hospital encounter of 11/13/18 (from the past 48 hour(s))  Comprehensive metabolic panel     Status: Abnormal   Collection Time: 11/13/18  4:26 PM  Result Value Ref Range   Sodium 140 135 -  145 mmol/L   Potassium 4.1 3.5 - 5.1 mmol/L   Chloride 99 98 - 111 mmol/L   CO2 32 22 - 32 mmol/L   Glucose, Bld 153 (H) 70 - 99 mg/dL   BUN 27 (H) 8 - 23 mg/dL   Creatinine, Ser 1.28 (H) 0.44 - 1.00 mg/dL   Calcium 8.8 (L) 8.9 - 10.3 mg/dL   Total Protein 6.4 (L) 6.5 - 8.1 g/dL   Albumin 3.1 (L) 3.5 - 5.0 g/dL   AST 35 15 - 41 U/L   ALT 22 0 - 44 U/L   Alkaline Phosphatase 91 38 - 126 U/L   Total Bilirubin 1.0 0.3 - 1.2 mg/dL   GFR calc non Af Amer 37 (L) >60 mL/min   GFR calc Af Amer 42 (L) >60 mL/min   Anion gap 9 5 - 15    Comment: Performed at [facility] [place], [place] [address]  Brain natriuretic peptide     Status: Abnormal   Collection Time: 11/13/18  4:26 PM  Result Value Ref Range   B  Natriuretic Peptide 1,266.0 (H) 0.0 - 100.0 pg/mL    Comment: Performed at [facility], [address] [place], [place] [address]  Troponin I - Once     Status: Abnormal   Collection Time: 11/13/18  4:26 PM  Result Value Ref Range   Troponin I 0.05 (HH) <0.03 ng/mL    Comment: CRITICAL RESULT CALLED TO, READ BACK BY AND VERIFIED WITH SONJIA WEAVER 11/13/18 @ 1703  [place] Performed at [facility], [address] [place], [place] [address]   CBC with Differential     Status: Abnormal   Collection Time: 11/13/18  4:26 PM  Result Value Ref Range   WBC 8.8 4.0 - 10.5 K/uL   RBC 3.44 (L) 3.87 - 5.11 MIL/uL   Hemoglobin 10.0 (L) 12.0 - 15.0 g/dL   HCT 33.7 (L) 36.0 - 46.0 %   MCV 98.0 80.0 - 100.0 fL   MCH 29.1 26.0 - 34.0 pg   MCHC 29.7 (L) 30.0 - 36.0 g/dL   RDW 19.8 (H) 11.5 - 15.5 %   Platelets 157 150 - 400 K/uL   nRBC 0.0 0.0 - 0.2 %   Neutrophils Relative % 71 %   Neutro Abs 6.3 1.7 - 7.7 K/uL   Lymphocytes Relative 13 %   Lymphs Abs 1.1 0.7 - 4.0 K/uL   Monocytes Relative 14 %   Monocytes Absolute 1.2 (H) 0.1 - 1.0 K/uL   Eosinophils Relative 1 %   Eosinophils Absolute 0.1 0.0 - 0.5 K/uL   Basophils Relative 1 %   Basophils Absolute 0.1 0.0 - 0.1 K/uL   Immature Granulocytes 0 %   Abs Immature Granulocytes 0.03 0.00 - 0.07 K/uL    Comment: Performed at [facility], [address] [place], [place] [address]  Troponin I - Once     Status: Abnormal   Collection Time: 11/13/18  6:36 PM  Result Value Ref Range   Troponin I 0.05 (HH) <0.03 ng/mL    Comment: CRITICAL VALUE NOTED. VALUE IS CONSISTENT WITH PREVIOUSLY REPORTED/CALLED VALUE / [place] Performed at [facility], [address] [place], [place] [address]   SARS Coronavirus 2 (CEPHEID - Performed in [facility] hospital lab), [facility] Order     Status: None   Collection Time: 11/13/18  6:57 PM  Result Value Ref Range   SARS Coronavirus 2 NEGATIVE NEGATIVE  Comment: (NOTE) If  result is NEGATIVE SARS-CoV-2 target nucleic acids are NOT DETECTED. The SARS-CoV-2 RNA is generally detectable in upper and lower  respiratory specimens during the acute phase of infection. The lowest  concentration of SARS-CoV-2 viral copies this assay can detect is 250  copies / mL. A negative result does not preclude SARS-CoV-2 infection  and should not be used as the sole basis for treatment or other  patient management decisions.  A negative result may occur with  improper specimen collection / handling, submission of specimen other  than nasopharyngeal swab, presence of viral mutation(s) within the  areas targeted by this assay, and inadequate number of viral copies  (<250 copies / mL). A negative result must be combined with clinical  observations, patient history, and epidemiological information. If result is POSITIVE SARS-CoV-2 target nucleic acids are DETECTED. The SARS-CoV-2 RNA is generally detectable in upper and lower  respiratory specimens dur ing the acute phase of infection.  Positive  results are indicative of active infection with SARS-CoV-2.  Clinical  correlation with patient history and other diagnostic information is  necessary to determine patient infection status.  Positive results do  not rule out bacterial infection or co-infection with other viruses. If result is PRESUMPTIVE POSTIVE SARS-CoV-2 nucleic acids MAY BE PRESENT.   A presumptive positive result was obtained on the submitted specimen  and confirmed on repeat testing.  While 2019 novel coronavirus  (SARS-CoV-2) nucleic acids may be present in the submitted sample  additional confirmatory testing may be necessary for epidemiological  and / or clinical management purposes  to differentiate between  SARS-CoV-2 and other Sarbecovirus currently known to infect humans.  If clinically indicated additional testing with an alternate test  methodology [phone]) is advised. The SARS-CoV-2 RNA is generally   detectable in upper and lower respiratory sp ecimens during the acute  phase of infection. The expected result is Negative. Fact Sheet for Patients:  StrictlyIdeas.no Fact Sheet for Healthcare Providers: BankingDealers.co.za This test is not yet approved or cleared by the [place] FDA and has been authorized for detection and/or diagnosis of SARS-CoV-2 by FDA under an Emergency Use Authorization (EUA).  This EUA will remain in effect (meaning this test can be used) for the duration of the COVID-19 declaration under Section 564(b)(1) of the Act, 21 U.S.C. section 360bbb-3(b)(1), unless the authorization is terminated or revoked sooner. Performed at [facility], [place], [place] [address]    Ct Abdomen Pelvis Wo Contrast  Result Date: 11/13/2018 CLINICAL DATA:  Pain status post fall EXAM: CT CHEST, ABDOMEN AND PELVIS WITHOUT CONTRAST TECHNIQUE: Multidetector CT imaging of the chest, abdomen and pelvis was performed following the standard protocol without IV contrast. COMPARISON:  06/29/2018 FINDINGS: CT CHEST FINDINGS Cardiovascular: The heart is enlarged. The main pulmonary artery is dilated measuring up to 3.3 cm. Advanced coronary and aortic calcifications are noted. There is a dual chamber left-sided pacemaker in place. Mediastinum/Nodes: Multiple prominent lymph nodes are noted. There are no pathologically enlarged axillary or supraclavicular lymph nodes. Lungs/Pleura: There is bilateral interlobular septal thickening with ground-glass appearance of the upper lobes. There are small bilateral pleural effusions, right greater than left. There are few scattered additional ground-glass opacities. For example there is a 1 cm ground-glass opacity in the right middle lobe (axial series 6, image 83). There is atelectasis at the lung bases. No pneumothorax. Musculoskeletal: No chest wall mass or suspicious bone lesions  identified. CT ABDOMEN PELVIS FINDINGS Hepatobiliary: The liver  is unremarkable. There is cholelithiasis without CT evidence of acute cholecystitis. The common bile duct is poorly evaluated secondary to motion artifact. Pancreas: Unremarkable. No pancreatic ductal dilatation or surrounding inflammatory changes. Spleen: Normal in size without focal abnormality. Adrenals/Urinary Tract: Adrenal glands are unremarkable. Kidneys are normal, without renal calculi, focal lesion, or hydronephrosis. Right-sided renal cysts are again noted. Bladder is unremarkable. Stomach/Bowel: There are scattered colonic diverticula without CT evidence of diverticulitis. There is a wide-mouth ventral wall hernia containing a loop of the transverse colon without evidence of an obstruction. The patient appears to be status post prior appendectomy. There is no evidence of a small-bowel obstruction. Vascular/Lymphatic: Aortic atherosclerosis. No enlarged abdominal or pelvic lymph nodes. Reproductive: Status post hysterectomy. No adnexal masses. There is a small amount of free fluid in the pelvis. Other: There is a small volume of ascites, most notably in the upper abdomen. Musculoskeletal: Extensive posterior fusion hardware is again noted. There is stable height loss of the T11 vertebral body and the L2 vertebral bodies. The hardware appears intact without evidence of a fracture. IMPRESSION: 1. Cardiomegaly with findings concerning for pulmonary edema. There are small bilateral pleural effusions. 2. There is a 1 cm ground-glass opacity in the right middle lobe that is favored to be secondary to pulmonary edema. A three-month follow-up CT of the chest is recommended to confirm resolution of this finding. 3. No acute intra-abdominal abnormality detected. 4. No displaced fractures, however fracture detection is limited by diffuse osteopenia. 5. Cholelithiasis without CT evidence of acute cholecystitis. 6. Trace abdominal ascites. Aortic  Atherosclerosis (ICD10-I70.0). Electronically Signed   By: Constance Holster M.D.   On: 11/13/2018 20:56   Ct Head Wo Contrast  Result Date: 11/13/2018 CLINICAL DATA:  Fall last night with head injury. EXAM: CT HEAD WITHOUT CONTRAST CT CERVICAL SPINE WITHOUT CONTRAST TECHNIQUE: Multidetector CT imaging of the head and cervical spine was performed following the standard protocol without intravenous contrast. Multiplanar CT image reconstructions of the cervical spine were also generated. COMPARISON:  Cervical spine CT 09/02/2018. Head CT 01/10/2018. FINDINGS: CT HEAD FINDINGS Brain: There is no evidence of acute infarct, intracranial hemorrhage, mass, midline shift, or extra-axial fluid collection. The ventricles and sulci are normal. Minimal hypoattenuation in the cerebral white matter is unchanged and not considered abnormal for age. Vascular: Calcified atherosclerosis at the skull base. No hyperdense vessel. Skull: No fracture or focal osseous lesion. Sinuses/Orbits: Visualized paranasal sinuses and mastoid air cells are clear. Bilateral cataract extraction is noted. Other: None. CT CERVICAL SPINE FINDINGS The study is mildly motion degraded despite repeat imaging through the upper cervical spine. Alignment: 2 mm anterolisthesis of C4 on C5, stable to minimally increased and likely degenerative. Skull base and vertebrae: No acute fracture is identified within limitations of motion artifact. No destructive osseous lesion. Soft tissues and spinal canal: No prevertebral fluid or swelling. No visible canal hematoma. Disc levels: Mild disc and mild-to-moderate facet degeneration in the cervical spine. Upper chest: Respiratory motion artifact through the upper lobes with asymmetric ground-glass/mosaic attenuation on the right, also present on the prior spine CT. 1 cm focal consolidative opacity in the right apex. The lungs are more fully evaluated on today's chest radiograph. Other: Carotid artery atherosclerosis.  IMPRESSION: 1. No evidence of acute intracranial abnormality. 2. Motion degraded cervical spine CT without evidence of acute fracture. Electronically Signed   By: Logan Bores M.D.   On: 11/13/2018 17:09   Ct Chest Wo Contrast  Result Date: 11/13/2018 CLINICAL DATA:  Pain  status post fall EXAM: CT CHEST, ABDOMEN AND PELVIS WITHOUT CONTRAST TECHNIQUE: Multidetector CT imaging of the chest, abdomen and pelvis was performed following the standard protocol without IV contrast. COMPARISON:  06/29/2018 FINDINGS: CT CHEST FINDINGS Cardiovascular: The heart is enlarged. The main pulmonary artery is dilated measuring up to 3.3 cm. Advanced coronary and aortic calcifications are noted. There is a dual chamber left-sided pacemaker in place. Mediastinum/Nodes: Multiple prominent lymph nodes are noted. There are no pathologically enlarged axillary or supraclavicular lymph nodes. Lungs/Pleura: There is bilateral interlobular septal thickening with ground-glass appearance of the upper lobes. There are small bilateral pleural effusions, right greater than left. There are few scattered additional ground-glass opacities. For example there is a 1 cm ground-glass opacity in the right middle lobe (axial series 6, image 83). There is atelectasis at the lung bases. No pneumothorax. Musculoskeletal: No chest wall mass or suspicious bone lesions identified. CT ABDOMEN PELVIS FINDINGS Hepatobiliary: The liver is unremarkable. There is cholelithiasis without CT evidence of acute cholecystitis. The common bile duct is poorly evaluated secondary to motion artifact. Pancreas: Unremarkable. No pancreatic ductal dilatation or surrounding inflammatory changes. Spleen: Normal in size without focal abnormality. Adrenals/Urinary Tract: Adrenal glands are unremarkable. Kidneys are normal, without renal calculi, focal lesion, or hydronephrosis. Right-sided renal cysts are again noted. Bladder is unremarkable. Stomach/Bowel: There are scattered  colonic diverticula without CT evidence of diverticulitis. There is a wide-mouth ventral wall hernia containing a loop of the transverse colon without evidence of an obstruction. The patient appears to be status post prior appendectomy. There is no evidence of a small-bowel obstruction. Vascular/Lymphatic: Aortic atherosclerosis. No enlarged abdominal or pelvic lymph nodes. Reproductive: Status post hysterectomy. No adnexal masses. There is a small amount of free fluid in the pelvis. Other: There is a small volume of ascites, most notably in the upper abdomen. Musculoskeletal: Extensive posterior fusion hardware is again noted. There is stable height loss of the T11 vertebral body and the L2 vertebral bodies. The hardware appears intact without evidence of a fracture. IMPRESSION: 1. Cardiomegaly with findings concerning for pulmonary edema. There are small bilateral pleural effusions. 2. There is a 1 cm ground-glass opacity in the right middle lobe that is favored to be secondary to pulmonary edema. A three-month follow-up CT of the chest is recommended to confirm resolution of this finding. 3. No acute intra-abdominal abnormality detected. 4. No displaced fractures, however fracture detection is limited by diffuse osteopenia. 5. Cholelithiasis without CT evidence of acute cholecystitis. 6. Trace abdominal ascites. Aortic Atherosclerosis (ICD10-I70.0). Electronically Signed   By: Constance Holster M.D.   On: 11/13/2018 20:56   Ct Cervical Spine Wo Contrast  Result Date: 11/13/2018 CLINICAL DATA:  Fall last night with head injury. EXAM: CT HEAD WITHOUT CONTRAST CT CERVICAL SPINE WITHOUT CONTRAST TECHNIQUE: Multidetector CT imaging of the head and cervical spine was performed following the standard protocol without intravenous contrast. Multiplanar CT image reconstructions of the cervical spine were also generated. COMPARISON:  Cervical spine CT 09/02/2018. Head CT 01/10/2018. FINDINGS: CT HEAD FINDINGS Brain:  There is no evidence of acute infarct, intracranial hemorrhage, mass, midline shift, or extra-axial fluid collection. The ventricles and sulci are normal. Minimal hypoattenuation in the cerebral white matter is unchanged and not considered abnormal for age. Vascular: Calcified atherosclerosis at the skull base. No hyperdense vessel. Skull: No fracture or focal osseous lesion. Sinuses/Orbits: Visualized paranasal sinuses and mastoid air cells are clear. Bilateral cataract extraction is noted. Other: None. CT CERVICAL SPINE FINDINGS The study is  mildly motion degraded despite repeat imaging through the upper cervical spine. Alignment: 2 mm anterolisthesis of C4 on C5, stable to minimally increased and likely degenerative. Skull base and vertebrae: No acute fracture is identified within limitations of motion artifact. No destructive osseous lesion. Soft tissues and spinal canal: No prevertebral fluid or swelling. No visible canal hematoma. Disc levels: Mild disc and mild-to-moderate facet degeneration in the cervical spine. Upper chest: Respiratory motion artifact through the upper lobes with asymmetric ground-glass/mosaic attenuation on the right, also present on the prior spine CT. 1 cm focal consolidative opacity in the right apex. The lungs are more fully evaluated on today's chest radiograph. Other: Carotid artery atherosclerosis. IMPRESSION: 1. No evidence of acute intracranial abnormality. 2. Motion degraded cervical spine CT without evidence of acute fracture. Electronically Signed   By: Logan Bores M.D.   On: 11/13/2018 17:09   Dg Chest Portable 1 View  Result Date: 11/13/2018 CLINICAL DATA:  Shortness of breath. History of COPD. EXAM: PORTABLE CHEST 1 VIEW COMPARISON:  09/02/2018 FINDINGS: A dual lead pacemaker remains in place. The cardiomediastinal silhouette is unchanged allowing for leftward patient rotation on today's study with cardiomegaly and aortic atherosclerosis again noted. The interstitial  markings remain mildly increased diffusely with mild central pulmonary vascular congestion. No airspace consolidation, pleural effusion, pneumothorax is identified. Prior posterior thoracolumbar spinal fusion is noted. IMPRESSION: Cardiomegaly with suspected mild interstitial edema. Electronically Signed   By: Logan Bores M.D.   On: 11/13/2018 16:56    Pending Labs Unresulted Labs (From admission, onward)    Start     Ordered   11/14/18 4097  Basic metabolic panel  Daily,   STAT     11/13/18 2348   11/13/18 2355  Troponin I - Now Then Q6H  Now then every 6 hours,   STAT     11/13/18 2354   11/13/18 2352  Hemoglobin A1c  Once,   STAT    Comments:  To assess prior glycemic control    11/13/18 2351          Vitals/Pain Today's Vitals   11/13/18 1900 11/13/18 2240 11/13/18 2245 11/13/18 2254  BP:  (!) 143/72  (!) 143/72  Pulse: 77 78 69   Resp: 20 (!) 23 19   Temp:      TempSrc:      SpO2: 100% 100% 100%   Weight:      Height:      PainSc:        Isolation Precautions No active isolations  Medications Medications  insulin glargine (LANTUS) injection 10 Units (has no administration in time range)  senna (SENOKOT) tablet 8.6 mg (has no administration in time range)  spironolactone (ALDACTONE) tablet 12.5 mg (has no administration in time range)  aspirin EC tablet 81 mg (has no administration in time range)  buPROPion (WELLBUTRIN XL) 24 hr tablet 300 mg (has no administration in time range)  busPIRone (BUSPAR) tablet 5 mg (has no administration in time range)  carvedilol (COREG) tablet 6.25 mg (has no administration in time range)  cholecalciferol (VITAMIN D) tablet 2,000 Units (has no administration in time range)  docusate sodium (COLACE) capsule 100 mg (has no administration in time range)  escitalopram (LEXAPRO) tablet 10 mg (has no administration in time range)  ferrous sulfate tablet 325 mg (has no administration in time range)  loratadine (CLARITIN) tablet 10 mg (has  no administration in time range)  gabapentin (NEURONTIN) capsule 100 mg (has no administration in time range)  Melatonin TABS 3 mg (has no administration in time range)  pantoprazole (PROTONIX) EC tablet 40 mg (has no administration in time range)  Rivaroxaban (XARELTO) tablet 15 mg (has no administration in time range)  rOPINIRole (REQUIP) tablet 0.5 mg (has no administration in time range)  traZODone (DESYREL) tablet 50 mg (has no administration in time range)  vitamin B-12 (CYANOCOBALAMIN) tablet 1,000 mcg (has no administration in time range)  vitamin C (ASCORBIC ACID) tablet 500 mg (has no administration in time range)  Vitamin D (Ergocalciferol) (DRISDOL) capsule 50,000 Units (has no administration in time range)  sodium chloride flush (NS) 0.9 % injection 3 mL (has no administration in time range)  sodium chloride flush (NS) 0.9 % injection 3 mL (has no administration in time range)  0.9 %  sodium chloride infusion (has no administration in time range)  acetaminophen (TYLENOL) tablet 650 mg (has no administration in time range)  ondansetron (ZOFRAN) injection 4 mg (has no administration in time range)  furosemide (LASIX) injection 40 mg (has no administration in time range)  insulin aspart (novoLOG) injection 0-15 Units (has no administration in time range)  insulin aspart (novoLOG) injection 0-5 Units (has no administration in time range)  ipratropium-albuterol (DUONEB) 0.5-2.5 (3) MG/3ML nebulizer solution 3 mL (has no administration in time range)  furosemide (LASIX) injection 40 mg (40 mg Intravenous Given 11/13/18 1756)  oxyCODONE (Oxy IR/ROXICODONE) immediate release tablet 5 mg (5 mg Oral Given 11/13/18 2252)    Mobility walks with device High fall risk   Focused Assessments Cardiac Assessment Handoff:  Cardiac Rhythm: Other (Comment)(paced) Lab Results  Component Value Date   CKTOTAL 39 03/04/2012   CKMB 0.7 03/04/2012   TROPONINI 0.05 (HH) 11/13/2018   No results  found for: DDIMER Does the Patient currently have chest pain? No     R Recommendations: See Admitting Provider Note  Report given to:   Additional Notes:

## 2018-11-14 NOTE — Progress Notes (Signed)
[facility] [place] [name]    MR#:  [id number]  DATE OF BIRTH:  02/21/1925  SUBJECTIVE:   Came in with increasing shortness of breath and leg swelling and fatigue ability. Found to be in heart failure. Patient states she can sleep well last night because of her scoliosis and back pain.  REVIEW OF SYSTEMS:   Review of Systems  Constitutional: Negative for chills, fever and weight loss.  HENT: Negative for ear discharge, ear pain and nosebleeds.   Eyes: Negative for blurred vision, pain and discharge.  Respiratory: Positive for shortness of breath. Negative for sputum production, wheezing and stridor.   Cardiovascular: Negative for chest pain, palpitations, orthopnea and PND.  Gastrointestinal: Negative for abdominal pain, diarrhea, nausea and vomiting.  Genitourinary: Negative for frequency and urgency.  Musculoskeletal: Positive for falls and joint pain. Negative for back pain.  Neurological: Positive for weakness. Negative for sensory change, speech change and focal weakness.  Psychiatric/Behavioral: Negative for depression and hallucinations. The patient is not nervous/anxious.    Tolerating Diet:yes Tolerating PT: pending  DRUG ALLERGIES:   Allergies  Allergen Reactions  . Ceftriaxone Swelling    Tolerates piperacillin/tazobactam, PCN G  . Cefuroxime Hives    Tolerates piperacillin/tazobactam, PCN G  . Cephalosporins Swelling and Other (See Comments)    Reaction:  Fainting/dry mouth  Tolerates piperacillin/tazobactam, PCN G  . Codeine Nausea And Vomiting  . Epinephrine Other (See Comments)    Reaction:  Fainting   . Morphine And Related Other (See Comments)    Reaction:  GI upset   . Buprenorphine Hcl Nausea And Vomiting  . Ciprofloxacin Itching, Swelling and Rash  . Latex Rash  . Lidocaine Rash  . Procaine Rash    VITALS:  Blood pressure (!) 142/65, pulse 70, temperature 98.2 F (36.8 C), resp. rate  20, height 5\' 7"  (1.702 m), weight 84 kg, SpO2 100 %.  PHYSICAL EXAMINATION:   Physical Exam  GENERAL:  85 y.o.-year-old patient lying in the bed with no acute distress. Appears week EYES: Pupils equal, round, reactive to light and accommodation. No scleral icterus. Extraocular muscles intact.  HEENT: Head atraumatic, normocephalic. Oropharynx and nasopharynx clear.  NECK:  Supple, no jugular venous distention. No thyroid enlargement, no tenderness.  LUNGS: decreased breath sounds bilaterally, no wheezing, rales, rhonchi. No use of accessory muscles of respiration.  CARDIOVASCULAR: S1, S2 normal. No murmurs, rubs, or gallops.  ABDOMEN: Soft, nontender, nondistended. Bowel sounds present. No organomegaly or mass.  EXTREMITIES: Dr. lower extremity edema with chronic venous stasis changes and bilateral chronic wounds in both the legs NEUROLOGIC: Cranial nerves II through XII are intact. No focal Motor or sensory deficits b/l.   PSYCHIATRIC:  patient is alert and oriented x 3.  SKIN: lateral lower extremity wounds as description by nursing staff  LABORATORY PANEL:  CBC Recent Labs  Lab 11/13/18 1626  WBC 8.8  HGB 10.0*  HCT 33.7*  PLT 157    Chemistries  Recent Labs  Lab 11/13/18 1626 11/14/18 0634  NA 140 141  K 4.1 4.2  CL 99 100  CO2 32 31  GLUCOSE 153* 99  BUN 27* 30*  CREATININE 1.28* 1.25*  CALCIUM 8.8* 8.7*  AST 35  --   ALT 22  --   ALKPHOS 91  --   BILITOT 1.0  --    Cardiac Enzymes Recent Labs  Lab 11/14/18 1204  TROPONINI 0.04*   RADIOLOGY:  Ct Abdomen  Pelvis Wo Contrast  Result Date: 11/13/2018 CLINICAL DATA:  Pain status post fall EXAM: CT CHEST, ABDOMEN AND PELVIS WITHOUT CONTRAST TECHNIQUE: Multidetector CT imaging of the chest, abdomen and pelvis was performed following the standard protocol without IV contrast. COMPARISON:  06/29/2018 FINDINGS: CT CHEST FINDINGS Cardiovascular: The heart is enlarged. The main pulmonary artery is dilated measuring  up to 3.3 cm. Advanced coronary and aortic calcifications are noted. There is a dual chamber left-sided pacemaker in place. Mediastinum/Nodes: Multiple prominent lymph nodes are noted. There are no pathologically enlarged axillary or supraclavicular lymph nodes. Lungs/Pleura: There is bilateral interlobular septal thickening with ground-glass appearance of the upper lobes. There are small bilateral pleural effusions, right greater than left. There are few scattered additional ground-glass opacities. For example there is a 1 cm ground-glass opacity in the right middle lobe (axial series 6, image 83). There is atelectasis at the lung bases. No pneumothorax. Musculoskeletal: No chest wall mass or suspicious bone lesions identified. CT ABDOMEN PELVIS FINDINGS Hepatobiliary: The liver is unremarkable. There is cholelithiasis without CT evidence of acute cholecystitis. The common bile duct is poorly evaluated secondary to motion artifact. Pancreas: Unremarkable. No pancreatic ductal dilatation or surrounding inflammatory changes. Spleen: Normal in size without focal abnormality. Adrenals/Urinary Tract: Adrenal glands are unremarkable. Kidneys are normal, without renal calculi, focal lesion, or hydronephrosis. Right-sided renal cysts are again noted. Bladder is unremarkable. Stomach/Bowel: There are scattered colonic diverticula without CT evidence of diverticulitis. There is a wide-mouth ventral wall hernia containing a loop of the transverse colon without evidence of an obstruction. The patient appears to be status post prior appendectomy. There is no evidence of a small-bowel obstruction. Vascular/Lymphatic: Aortic atherosclerosis. No enlarged abdominal or pelvic lymph nodes. Reproductive: Status post hysterectomy. No adnexal masses. There is a small amount of free fluid in the pelvis. Other: There is a small volume of ascites, most notably in the upper abdomen. Musculoskeletal: Extensive posterior fusion hardware is  again noted. There is stable height loss of the T11 vertebral body and the L2 vertebral bodies. The hardware appears intact without evidence of a fracture. IMPRESSION: 1. Cardiomegaly with findings concerning for pulmonary edema. There are small bilateral pleural effusions. 2. There is a 1 cm ground-glass opacity in the right middle lobe that is favored to be secondary to pulmonary edema. A three-month follow-up CT of the chest is recommended to confirm resolution of this finding. 3. No acute intra-abdominal abnormality detected. 4. No displaced fractures, however fracture detection is limited by diffuse osteopenia. 5. Cholelithiasis without CT evidence of acute cholecystitis. 6. Trace abdominal ascites. Aortic Atherosclerosis (ICD10-I70.0). Electronically Signed   By: Constance Holster M.D.   On: 11/13/2018 20:56   Ct Head Wo Contrast  Result Date: 11/13/2018 CLINICAL DATA:  Fall last night with head injury. EXAM: CT HEAD WITHOUT CONTRAST CT CERVICAL SPINE WITHOUT CONTRAST TECHNIQUE: Multidetector CT imaging of the head and cervical spine was performed following the standard protocol without intravenous contrast. Multiplanar CT image reconstructions of the cervical spine were also generated. COMPARISON:  Cervical spine CT 09/02/2018. Head CT 01/10/2018. FINDINGS: CT HEAD FINDINGS Brain: There is no evidence of acute infarct, intracranial hemorrhage, mass, midline shift, or extra-axial fluid collection. The ventricles and sulci are normal. Minimal hypoattenuation in the cerebral white matter is unchanged and not considered abnormal for age. Vascular: Calcified atherosclerosis at the skull base. No hyperdense vessel. Skull: No fracture or focal osseous lesion. Sinuses/Orbits: Visualized paranasal sinuses and mastoid air cells are clear. Bilateral cataract extraction is  noted. Other: None. CT CERVICAL SPINE FINDINGS The study is mildly motion degraded despite repeat imaging through the upper cervical spine.  Alignment: 2 mm anterolisthesis of C4 on C5, stable to minimally increased and likely degenerative. Skull base and vertebrae: No acute fracture is identified within limitations of motion artifact. No destructive osseous lesion. Soft tissues and spinal canal: No prevertebral fluid or swelling. No visible canal hematoma. Disc levels: Mild disc and mild-to-moderate facet degeneration in the cervical spine. Upper chest: Respiratory motion artifact through the upper lobes with asymmetric ground-glass/mosaic attenuation on the right, also present on the prior spine CT. 1 cm focal consolidative opacity in the right apex. The lungs are more fully evaluated on today's chest radiograph. Other: Carotid artery atherosclerosis. IMPRESSION: 1. No evidence of acute intracranial abnormality. 2. Motion degraded cervical spine CT without evidence of acute fracture. Electronically Signed   By: Logan Bores M.D.   On: 11/13/2018 17:09   Ct Chest Wo Contrast  Result Date: 11/13/2018 CLINICAL DATA:  Pain status post fall EXAM: CT CHEST, ABDOMEN AND PELVIS WITHOUT CONTRAST TECHNIQUE: Multidetector CT imaging of the chest, abdomen and pelvis was performed following the standard protocol without IV contrast. COMPARISON:  06/29/2018 FINDINGS: CT CHEST FINDINGS Cardiovascular: The heart is enlarged. The main pulmonary artery is dilated measuring up to 3.3 cm. Advanced coronary and aortic calcifications are noted. There is a dual chamber left-sided pacemaker in place. Mediastinum/Nodes: Multiple prominent lymph nodes are noted. There are no pathologically enlarged axillary or supraclavicular lymph nodes. Lungs/Pleura: There is bilateral interlobular septal thickening with ground-glass appearance of the upper lobes. There are small bilateral pleural effusions, right greater than left. There are few scattered additional ground-glass opacities. For example there is a 1 cm ground-glass opacity in the right middle lobe (axial series 6, image  83). There is atelectasis at the lung bases. No pneumothorax. Musculoskeletal: No chest wall mass or suspicious bone lesions identified. CT ABDOMEN PELVIS FINDINGS Hepatobiliary: The liver is unremarkable. There is cholelithiasis without CT evidence of acute cholecystitis. The common bile duct is poorly evaluated secondary to motion artifact. Pancreas: Unremarkable. No pancreatic ductal dilatation or surrounding inflammatory changes. Spleen: Normal in size without focal abnormality. Adrenals/Urinary Tract: Adrenal glands are unremarkable. Kidneys are normal, without renal calculi, focal lesion, or hydronephrosis. Right-sided renal cysts are again noted. Bladder is unremarkable. Stomach/Bowel: There are scattered colonic diverticula without CT evidence of diverticulitis. There is a wide-mouth ventral wall hernia containing a loop of the transverse colon without evidence of an obstruction. The patient appears to be status post prior appendectomy. There is no evidence of a small-bowel obstruction. Vascular/Lymphatic: Aortic atherosclerosis. No enlarged abdominal or pelvic lymph nodes. Reproductive: Status post hysterectomy. No adnexal masses. There is a small amount of free fluid in the pelvis. Other: There is a small volume of ascites, most notably in the upper abdomen. Musculoskeletal: Extensive posterior fusion hardware is again noted. There is stable height loss of the T11 vertebral body and the L2 vertebral bodies. The hardware appears intact without evidence of a fracture. IMPRESSION: 1. Cardiomegaly with findings concerning for pulmonary edema. There are small bilateral pleural effusions. 2. There is a 1 cm ground-glass opacity in the right middle lobe that is favored to be secondary to pulmonary edema. A three-month follow-up CT of the chest is recommended to confirm resolution of this finding. 3. No acute intra-abdominal abnormality detected. 4. No displaced fractures, however fracture detection is limited by  diffuse osteopenia. 5. Cholelithiasis without CT evidence of acute  cholecystitis. 6. Trace abdominal ascites. Aortic Atherosclerosis (ICD10-I70.0). Electronically Signed   By: Constance Holster M.D.   On: 11/13/2018 20:56   Ct Cervical Spine Wo Contrast  Result Date: 11/13/2018 CLINICAL DATA:  Fall last night with head injury. EXAM: CT HEAD WITHOUT CONTRAST CT CERVICAL SPINE WITHOUT CONTRAST TECHNIQUE: Multidetector CT imaging of the head and cervical spine was performed following the standard protocol without intravenous contrast. Multiplanar CT image reconstructions of the cervical spine were also generated. COMPARISON:  Cervical spine CT 09/02/2018. Head CT 01/10/2018. FINDINGS: CT HEAD FINDINGS Brain: There is no evidence of acute infarct, intracranial hemorrhage, mass, midline shift, or extra-axial fluid collection. The ventricles and sulci are normal. Minimal hypoattenuation in the cerebral white matter is unchanged and not considered abnormal for age. Vascular: Calcified atherosclerosis at the skull base. No hyperdense vessel. Skull: No fracture or focal osseous lesion. Sinuses/Orbits: Visualized paranasal sinuses and mastoid air cells are clear. Bilateral cataract extraction is noted. Other: None. CT CERVICAL SPINE FINDINGS The study is mildly motion degraded despite repeat imaging through the upper cervical spine. Alignment: 2 mm anterolisthesis of C4 on C5, stable to minimally increased and likely degenerative. Skull base and vertebrae: No acute fracture is identified within limitations of motion artifact. No destructive osseous lesion. Soft tissues and spinal canal: No prevertebral fluid or swelling. No visible canal hematoma. Disc levels: Mild disc and mild-to-moderate facet degeneration in the cervical spine. Upper chest: Respiratory motion artifact through the upper lobes with asymmetric ground-glass/mosaic attenuation on the right, also present on the prior spine CT. 1 cm focal consolidative  opacity in the right apex. The lungs are more fully evaluated on today's chest radiograph. Other: Carotid artery atherosclerosis. IMPRESSION: 1. No evidence of acute intracranial abnormality. 2. Motion degraded cervical spine CT without evidence of acute fracture. Electronically Signed   By: Logan Bores M.D.   On: 11/13/2018 17:09   Dg Chest Portable 1 View  Result Date: 11/13/2018 CLINICAL DATA:  Shortness of breath. History of COPD. EXAM: PORTABLE CHEST 1 VIEW COMPARISON:  09/02/2018 FINDINGS: A dual lead pacemaker remains in place. The cardiomediastinal silhouette is unchanged allowing for leftward patient rotation on today's study with cardiomegaly and aortic atherosclerosis again noted. The interstitial markings remain mildly increased diffusely with mild central pulmonary vascular congestion. No airspace consolidation, pleural effusion, pneumothorax is identified. Prior posterior thoracolumbar spinal fusion is noted. IMPRESSION: Cardiomegaly with suspected mild interstitial edema. Electronically Signed   By: Logan Bores M.D.   On: 11/13/2018 16:56   Dg Hip Unilat With Pelvis 2-3 Views Right  Result Date: 11/14/2018 CLINICAL DATA:  Recent falls with right-sided hip pain, initial encounter EXAM: DG HIP (WITH OR WITHOUT PELVIS) 2-3V RIGHT COMPARISON:  None. FINDINGS: Pelvic ring is intact. No acute fracture or dislocation is noted. No soft tissue abnormality is seen. IMPRESSION: No acute abnormality noted. Electronically Signed   By: Inez Catalina M.D.   On: 11/14/2018 00:56   ASSESSMENT AND PLAN:  [name]  is a 85 y.o. female with a known history of CHF, diabetes mellitus, COPD, peripheral vascular disease.  Patient was sent to the emergency room from assisted living home for decreased oxygen saturation  1.  Acute on chronic systolic CHF- most recent ejection fraction 40% - Will change Lasix to IV 80 mg twice daily - Coreg continued - Echocardiogram pending - Cardiology consult if needed -  Telemetry monitoring - Continue serial troponin levels - Oxygen per nasal cannula -DuoNeb every 6 hours as needed for  shortness of breath  2. fall - No evidence of acute intracranial abnormality or spinal fracture. - Left hip x-ray is neg for fracture - Physical therapy consultation for generalized weakness and impaired mobility  3.  Atrial fibrillation - With history of pacemaker placement - Xarelto continued - Patient is on telemetry monitoring  4.  Diabetes mellitus --Moderate sliding scale insulin - Metformin and Lantus insulin have been continued  5. Overall deconditioning with bilateral lower extremity edema and leg wounds. -Physical therapy to see patient. -Wound consult. Patient follows at wound clinic.  I spoke with patient's daughter at length. Updated.  Case discussed with Care Management/Social Worker. Management plans discussed with the patient, family and they are in agreement.  CODE STATUS: dnr  DVT Prophylaxis: xarelto  TOTAL TIME TAKING CARE OF THIS PATIENT: *30* minutes.  >50% time spent on counselling and coordination of care  POSSIBLE D/C IN *2-3* DAYS, DEPENDING ON CLINICAL CONDITION.  Note: This dictation was prepared with Dragon dictation along with smaller phrase technology. Any transcriptional errors that result from this process are unintentional.  [name] M.D on 11/14/2018 at 2:56 PM  Between 7am to 6pm - Pager - [phone]  After 6pm go to [email or web address] - password [email or web address] [facility]  Office  [phone]  CC: Primary care physician; Tracie Harrier, MDPatient ID: [name], female   DOB: 24-Mar-1925, 85 y.o.   MRN: [id number]

## 2018-11-14 NOTE — Progress Notes (Signed)
Family Meeting Note  Advance Directive:yes  Today a meeting took place with the Patient.  Patient is able to participate.  The following clinical team members were present during this meeting:MD  The following were discussed:Patient's diagnosis: acute on chronic systolic CHF, Patient's progosis: Unable to determine and Goals for treatment: DNR  Additional follow-up to be provided: prn  Time spent during discussion:20 minutes  Evette Doffing, MD

## 2018-11-14 NOTE — TOC Initial Note (Addendum)
Transition of Care [facility]) - Initial/Assessment Note    Patient Details  [name] MRN: [id number] Date of Birth: 1924-07-29  [facility] CM/SW Contact:    Latanya Maudlin, RN Phone Number: 11/14/2018, 9:30 AM  Clinical Narrative: [facility] team consulted to complete high risk re admission screening and assist with disposition. Patient resides at [facility]. Recently discharged [facility]. Notified Cassie of admission. Patient active with skilled nursing, PT, OT. Also, noted to be receiving wound care for a diabetic foot ulceration. Patient tells me she uses a rolling walker for DME. Also, previous documentation shows that her oxygen was nocturnal only but patient reports being o full time O2 now via [place]. Patient says she is normally between 2-3L but went up to 4L prior to coming to the hospital but wishes to use as little as possible. Patient wishes to return to [facility] and continue with [facility] services at discharge. Patient also being followed by outpatient palliative through [facility]. Patient mentioned she normally uses EMS for discharge transport.                  Expected Discharge Plan: Assisted Living Barriers to Discharge: Continued Medical Work up   Patient Goals and CMS Choice Patient states their goals for this hospitalization and ongoing recovery are:: to go back to [email or web address] Compare Post Acute Care list provided to:: Patient Choice offered to / list presented to : Patient  Expected Discharge Plan and Services Expected Discharge Plan: Assisted Living   Discharge Planning Services: CM Consult Post Acute Care Choice: Home Health, Resumption of Svcs/PTA Provider Living arrangements for the past 2 months: [place]: [facility] Date [place]: 11/14/18 Time [employer]: [phone] Representative spoke with at [place]:  [place] Arrangements/Services Living arrangements for the past 2 months: [place] Lives with:: Self Patient language and need for interpreter reviewed:: Yes Do you feel safe going back to the place where you live?: Yes      Need for Family Participation in Patient Care: No (Comment)   Current home services: DME, Home RN, Home PT, Home OT Criminal Activity/Legal Involvement Pertinent to Current Situation/Hospitalization: No - Comment as needed  Activities of Daily Living Home Assistive Devices/Equipment: Environmental consultant (specify type), Wheelchair(two wheels) ADL Screening (condition at time of admission) Patient's cognitive ability adequate to safely complete daily activities?: Yes Is the patient deaf or have difficulty hearing?: Yes Does the patient have difficulty seeing, even when wearing glasses/contacts?: No Does the patient have difficulty concentrating, remembering, or making decisions?: No Patient able to express need for assistance with ADLs?: Yes Does the patient have difficulty dressing or bathing?: Yes Independently performs ADLs?: No Communication: Independent Dressing (OT): Needs assistance Is this a change from baseline?: Pre-admission baseline Grooming: Needs assistance Is this a change from baseline?: Pre-admission baseline Feeding: Independent Bathing: Needs assistance Is this a change from baseline?: Pre-admission baseline Toileting: Needs assistance Is this a change from baseline?: Pre-admission baseline In/Out Bed: Needs assistance Is this a change from baseline?: Pre-admission baseline Walks in Home: Needs assistance Is this a change from baseline?: Pre-admission baseline Does the patient have difficulty walking or climbing stairs?: (wheelchair bound) Weakness of Legs: Left Weakness of Arms/Hands: None  Permission  Sought/Granted Permission sought to share information with : Case Manager, Family Supports, Forensic psychologist                Emotional Assessment Appearance:: Appears stated age Attitude/Demeanor/Rapport: Ambitious, Gracious Affect (typically observed): Accepting Orientation: : Oriented to Self, Oriented to Place, Oriented to  Time, Oriented to Situation Alcohol / Substance Use: Never Used    Admission diagnosis:  SOB (shortness of breath) [R06.02] Fall [W19.XXXA] Hypoxia [R09.02] AKI (acute kidney injury) ([place]) [N17.9] Congestive heart failure, unspecified HF chronicity, unspecified heart failure type [facility]) [I50.9] Patient Active Problem List   Diagnosis Date Noted  . Acute on chronic diastolic CHF (congestive heart failure) ([place]) 11/13/2018  . Dyspnea 07/23/2018  . Weakness generalized 07/23/2018  . Goals of care, counseling/discussion   . Palliative care encounter   . Influenza A 07/05/2018  . CHF exacerbation ([place]) 06/14/2018  . Cellulitis of left lower extremity 06/02/2018  . ARF (acute renal failure) ([place]) 05/17/2018  . Pneumonia 12/11/2017  . CAP (community acquired pneumonia) 04/14/2017  . COPD (chronic obstructive pulmonary disease) ([place]) 12/07/2016  . UTI (urinary tract infection) 12/07/2016  . Ventral hernia without obstruction or gangrene   . Hyponatremia 09/07/2015  . Acute respiratory failure ([place]) 09/01/2015  . Atrial fibrillation ([place]) 04/10/2015  . Essential hypertension 04/10/2015  . Diabetes ([place]) 04/10/2015  . Acute on chronic combined systolic and diastolic CHF (congestive heart failure) ([place]) 03/21/2015  . Pulmonary nodule 11/10/2014   PCP:  Tracie Harrier, MD Pharmacy:   [facility] [place] - [address] [place] [address] Phone: [phone] Fax: [phone]  [id number] - [place], [address] [place] [address] Phone: [phone] Fax: [phone]     Social Determinants of Health (SDOH) Interventions    Readmission Risk Interventions Readmission Risk Prevention  Plan 11/14/2018 09/07/2018 09/04/2018  Transportation Screening Complete - Complete  Medication Review [employer]) Complete - Complete  PCP or Specialist appointment within 3-5 days of discharge - Complete -  [place] or Home Care Consult Complete - Complete  SW Recovery Care/Counseling Consult - - Complete  Palliative Care Screening Complete - Complete  Skilled Nursing Facility Complete - Not Complete  SNF Comments - - PT recommended home health, patient is from [facility].  Some recent data might be hidden

## 2018-11-14 NOTE — ED Notes (Addendum)
Pt in xray

## 2018-11-14 NOTE — Progress Notes (Addendum)
Patient called out to use the bathroom. Patient had purewick in, but needed to have bowel movement. Patient had been up to the chair using walker this morning, so advised we would use the bedside commode. Patient on low bed, yellow socks on and walker in the room. Patient used walker to get up from the bed but as we turned to sit on the [facility], patient did not sit all the way back to the commode and started sliding down. This RN assisted patient to slide all the way to the ground and called for help. Vital signs WDL, no pain. MD paged to notify and contacted daughter [name] to notify. Voicemail left.    Update: 48: daughter [name] notified and updated over the phone. Dr. Posey Pronto aware, no new orders.

## 2018-11-14 NOTE — Progress Notes (Signed)
Pt states that her daughter brought her hearing aid today but upon checking the room hearing aid was nowhere to be found. Call the daughter and leave a voicemail. Awaiting call back. Will continue to monitor.

## 2018-11-14 NOTE — Progress Notes (Signed)
Patient called out to use the bathroom. Patient had purewick in, but needed to have bowel movement. Patient had been up to the chair using walker this morning, so advised we would use the bedside commode. Patient on low bed, yellow socks on and walker in the room. Patient used walker to get up from the bed but as we turned to sit on the [facility], patient did not sit all the way back to the commode and started sliding down. This RN assisted patient to slide all the way to the ground and called for help. Vital signs WDL, no pain. MD paged to notify and contacted daughter [name] to notify. Voicemail left.     11/14/18 1642  What Happened  Was fall witnessed? Yes  Who witnessed fall? RN  Patients activity before fall to/from bed, chair, or stretcher (to bedside commode)  Point of contact other (comment);buttocks (slide to floo r)  Was patient injured? No  Follow Up  MD notified sona patel  Time MD notified 86  Family notified Yes-comment (daughter victoria )  Time family notified 1728  Additional tests No  Progress note created (see row info) Yes  Adult Fall Risk Assessment  Risk Factor Category (scoring not indicated) High fall risk per protocol (document High fall risk)  Patient Fall Risk Level High fall risk  Adult Fall Risk Interventions  Required Bundle Interventions *See Row Information* High fall risk - low, moderate, and high requirements implemented  Additional Interventions Use of appropriate toileting equipment (bedpan, BSC, etc.)  Screening for Fall Injury Risk (To be completed on HIGH fall risk patients) - Assessing Need for Low Bed  Risk For Fall Injury- Low Bed Criteria Admitted as a result of a fall  Will Implement Low Bed and Floor Mats Yes  Vitals  BP (!) 151/70  MAP (mmHg) 93  BP Location Right Arm  BP Method Automatic  Patient Position (if appropriate) Lying  Pulse Rate 70  Resp 18  Oxygen Therapy  SpO2 97 %  O2 Device Room Air  Pain Assessment  Pain Scale 0-10   Pain Score 0  PCA/Epidural/Spinal Assessment  Respiratory Pattern Regular  Neurological  Neuro (WDL) WDL  Level of Consciousness Alert  Orientation Level Oriented X4  Musculoskeletal  Musculoskeletal (WDL) X  Assistive Device Front wheel walker  Generalized Weakness Yes  Weight Bearing Restrictions No  Integumentary  Integumentary (WDL) X (no change from morning assessment)     11/14/18 1642  What Happened  Was fall witnessed? Yes  Who witnessed fall? RN  Patients activity before fall to/from bed, chair, or stretcher (to bedside commode)  Point of contact other (comment);buttocks (slide to floo r)  Was patient injured? No  Follow Up  MD notified sona patel  Time MD notified 72  Family notified Yes-comment (daughter victoria )  Time family notified 1728  Additional tests No  Progress note created (see row info) Yes  Adult Fall Risk Assessment  Risk Factor Category (scoring not indicated) High fall risk per protocol (document High fall risk)  Patient Fall Risk Level High fall risk  Adult Fall Risk Interventions  Required Bundle Interventions *See Row Information* High fall risk - low, moderate, and high requirements implemented  Additional Interventions Use of appropriate toileting equipment (bedpan, BSC, etc.)  Screening for Fall Injury Risk (To be completed on HIGH fall risk patients) - Assessing Need for Low Bed  Risk For Fall Injury- Low Bed Criteria Admitted as a result of a fall  Will Implement Low  Bed and Floor Mats Yes  Vitals  BP (!) 151/70  MAP (mmHg) 93  BP Location Right Arm  BP Method Automatic  Patient Position (if appropriate) Lying  Pulse Rate 70  Resp 18  Oxygen Therapy  SpO2 97 %  O2 Device Room Air  Pain Assessment  Pain Scale 0-10  Pain Score 0  PCA/Epidural/Spinal Assessment  Respiratory Pattern Regular  Neurological  Neuro (WDL) WDL  Level of Consciousness Alert  Orientation Level Oriented X4  Musculoskeletal  Musculoskeletal  (WDL) X  Assistive Device Front wheel walker  Generalized Weakness Yes  Weight Bearing Restrictions No  Integumentary  Integumentary (WDL) X (no change from morning assessment)

## 2018-11-14 NOTE — Plan of Care (Signed)
  Problem: Education: Goal: Knowledge of General Education information will improve Description Including pain rating scale, medication(s)/side effects and non-pharmacologic comfort measures Outcome: Progressing   Problem: Health Behavior/Discharge Planning: Goal: Ability to manage health-related needs will improve Outcome: Progressing   Problem: Clinical Measurements: Goal: Respiratory complications will improve Outcome: Progressing Note:  Patient on room air now which is her baseline.

## 2018-11-14 NOTE — Progress Notes (Signed)
Pt was admitted on the floor with no signs of distress. VSS. Pt did came in with a open  diabetic ulcer on left leg measuring 7.5 cm length x 3 cm Width and have a depth of 0.3. It also have 2 scabbed wound on left lateral shin. R leg have a 81 old scabbed wound on different location. Wound consult was place. Call bell and telephone is within reach of pt. Pt was educated about safety and her plan of care. Will continue to monitor.

## 2018-11-14 NOTE — Plan of Care (Signed)
  Problem: Education: Goal: Knowledge of General Education information will improve Description: Including pain rating scale, medication(s)/side effects and non-pharmacologic comfort measures Outcome: Progressing   Problem: Safety: Goal: Ability to remain free from injury will improve Outcome: Progressing   

## 2018-11-15 ENCOUNTER — Inpatient Hospital Stay: Payer: Medicare Other

## 2018-11-15 LAB — BASIC METABOLIC PANEL
Anion gap: 11 (ref 5–15)
BUN: 41 mg/dL — ABNORMAL HIGH (ref 8–23)
CO2: 30 mmol/L (ref 22–32)
Calcium: 8.5 mg/dL — ABNORMAL LOW (ref 8.9–10.3)
Chloride: 98 mmol/L (ref 98–111)
Creatinine, Ser: 1.34 mg/dL — ABNORMAL HIGH (ref 0.44–1.00)
GFR calc Af Amer: 40 mL/min — ABNORMAL LOW (ref >60)
GFR calc non Af Amer: 35 mL/min — ABNORMAL LOW (ref >60)
Glucose, Bld: 191 mg/dL — ABNORMAL HIGH (ref 70–99)
Potassium: 4.5 mmol/L (ref 3.5–5.1)
Sodium: 139 mmol/L (ref 135–145)

## 2018-11-15 LAB — GLUCOSE, CAPILLARY
Glucose-Capillary: 174 mg/dL — ABNORMAL HIGH (ref 70–99)
Glucose-Capillary: 290 mg/dL — ABNORMAL HIGH (ref 70–99)
Glucose-Capillary: 250 mg/dL — ABNORMAL HIGH (ref 70–99)
Glucose-Capillary: 154 mg/dL — ABNORMAL HIGH (ref 70–99)

## 2018-11-15 MED ORDER — ENSURE MAX PROTEIN PO LIQD
11.0000 [oz_av] | Freq: Two times a day (BID) | ORAL | Status: DC
  Administered 2018-11-16: 11 [oz_av] via ORAL
  Filled 2018-11-15: qty 330

## 2018-11-15 MED ORDER — KETOROLAC TROMETHAMINE 15 MG/ML IJ SOLN
15.0000 mg | Freq: Once | INTRAMUSCULAR | Status: AC
  Administered 2018-11-15: 15 mg via INTRAVENOUS
  Filled 2018-11-15: qty 1

## 2018-11-15 MED ORDER — ADULT MULTIVITAMIN W/MINERALS CH
1.0000 | ORAL_TABLET | Freq: Every day | ORAL | Status: DC
  Administered 2018-11-16 – 2018-11-17 (×2): 1 via ORAL
  Filled 2018-11-15 (×2): qty 1

## 2018-11-15 MED ORDER — FUROSEMIDE 10 MG/ML IJ SOLN
80.0000 mg | Freq: Every day | INTRAMUSCULAR | Status: DC
  Administered 2018-11-16: 80 mg via INTRAVENOUS
  Filled 2018-11-15 (×3): qty 8

## 2018-11-15 NOTE — Progress Notes (Signed)
Initial Nutrition Assessment  RD working remotely.  DOCUMENTATION CODES:   Not applicable  INTERVENTION:  Provide Ensure Max Protein po BID, each supplement provides 150 kcal and 30 grams of protein.  Provide daily MVI.  NUTRITION DIAGNOSIS:   Increased nutrient needs related to wound healing, catabolic illness(COPD, CHF) as evidenced by estimated needs.  GOAL:   Patient will meet greater than or equal to 90% of their needs  MONITOR:   PO intake, Supplement acceptance, Labs, Weight trends, Skin, I & O's  REASON FOR ASSESSMENT:   Malnutrition Screening Tool    ASSESSMENT:   85 year old female with PMHx of CHF, DM, HTN, chronic back pain, COPD admitted with acute on chronic systolic CHF.   Attempted to call patient's phone to obtain nutrition/weight history but she was unable to answer. Per chart PO intake has been variable over the past couple days ranging from 0%/bites to 100%.  Weight in chart appears to fluctuate significantly so unable to identify a clear weight trend. Patient's weight this admission appears to be increasing. Could be from fluid or inaccurate scales.  Medications reviewed and include: Colace 100 mg BID, ferrous sulfate 325 mg daily, Lasix 80 mg daily, gabapentin, Novolog 0-15 units TID, Novolog 0-5 units QHS, Lantus 10 units QHS, Melatonin 5 mg QHS, pantoprazole, senna 1 tablet daily, vitamin B12 1000 micrograms daily, vitamin C 500mg  daily, vitamin D 50000 units daily every Thursday.  Labs reviewed: CBG 163-290, BUN 41, Creatinine 1.34.  NUTRITION - FOCUSED PHYSICAL EXAM:  Unable to complete at this time.  Diet Order:   Diet Order            Diet Heart Room service appropriate? Yes; Fluid consistency: Thin  Diet effective now             EDUCATION NEEDS:   Not appropriate for education at this time  Skin:  Skin Assessment: Skin Integrity Issues:(diabetic ulcers to bilateral lower extremities)  Last BM:  11/13/2018 per chart  Height:    Ht Readings from Last 1 Encounters:  11/14/18 5\' 7"  (1.702 m)   Weight:   Wt Readings from Last 1 Encounters:  11/15/18 88.5 kg   Ideal Body Weight:  61.4 kg  BMI:  Body mass index is 30.54 kg/m.  Estimated Nutritional Needs:   Kcal:  1600-1800  Protein:  80-90 grams  Fluid:  1.5 L/day  Willey Blade, MS, RD, LDN Office: [phone] Pager: [phone] After Hours/Weekend Pager: [phone]

## 2018-11-15 NOTE — Progress Notes (Signed)
[facility] [name]    MR#:  [id number]  DATE OF BIRTH:  02/28/1925  SUBJECTIVE:   Came in with increasing shortness of breath and leg swelling and fatigue ability. Found to be in heart failure. Patient states she slept well last night OOB to chair today  2 liter [place] oxygen  REVIEW OF SYSTEMS:   Review of Systems  Constitutional: Negative for chills, fever and weight loss.  HENT: Negative for ear discharge, ear pain and nosebleeds.   Eyes: Negative for blurred vision, pain and discharge.  Respiratory: Positive for shortness of breath. Negative for sputum production, wheezing and stridor.   Cardiovascular: Negative for chest pain, palpitations, orthopnea and PND.  Gastrointestinal: Negative for abdominal pain, diarrhea, nausea and vomiting.  Genitourinary: Negative for frequency and urgency.  Musculoskeletal: Positive for falls and joint pain. Negative for back pain.  Neurological: Positive for weakness. Negative for sensory change, speech change and focal weakness.  Psychiatric/Behavioral: Negative for depression and hallucinations. The patient is not nervous/anxious.    Tolerating Diet:yes Tolerating HA:[id number] SNF  DRUG ALLERGIES:   Allergies  Allergen Reactions  . Ceftriaxone Swelling    Tolerates piperacillin/tazobactam, PCN G  . Cefuroxime Hives    Tolerates piperacillin/tazobactam, PCN G  . Cephalosporins Swelling and Other (See Comments)    Reaction:  Fainting/dry mouth  Tolerates piperacillin/tazobactam, PCN G  . Codeine Nausea And Vomiting  . Epinephrine Other (See Comments)    Reaction:  Fainting   . Morphine And Related Other (See Comments)    Reaction:  GI upset   . Buprenorphine Hcl Nausea And Vomiting  . Ciprofloxacin Itching, Swelling and Rash  . Latex Rash  . Lidocaine Rash  . Procaine Rash    VITALS:  Blood pressure 132/74, pulse 70, temperature (!) 97.5 F (36.4 C),  temperature source Oral, resp. rate 17, height 5\' 7"  (1.702 m), weight 88.5 kg, SpO2 96 %.  PHYSICAL EXAMINATION:   Physical Exam  GENERAL:  85 y.o.-year-old patient lying in the bed with no acute distress. Appears weak EYES: Pupils equal, round, reactive to light and accommodation. No scleral icterus. Extraocular muscles intact.  HEENT: Head atraumatic, normocephalic. Oropharynx and nasopharynx clear.  NECK:  Supple, no jugular venous distention. No thyroid enlargement, no tenderness.  LUNGS: decreased breath sounds bilaterally, no wheezing, rales, rhonchi. No use of accessory muscles of respiration.  CARDIOVASCULAR: S1, S2 normal. No murmurs, rubs, or gallops.  ABDOMEN: Soft, nontender, nondistended. Bowel sounds present. No organomegaly or mass.  EXTREMITIES: Dr. lower extremity edema with chronic venous stasis changes and bilateral chronic wounds in both the legs NEUROLOGIC: Cranial nerves II through XII are intact. No focal Motor or sensory deficits b/l.   PSYCHIATRIC:  patient is alert and oriented x 3.  SKIN: lateral lower extremity wounds as description by nursing staff  LABORATORY PANEL:  CBC Recent Labs  Lab 11/13/18 1626  WBC 8.8  HGB 10.0*  HCT 33.7*  PLT 157    Chemistries  Recent Labs  Lab 11/13/18 1626  11/15/18 0517  NA 140   < > 139  K 4.1   < > 4.5  CL 99   < > 98  CO2 32   < > 30  GLUCOSE 153*   < > 191*  BUN 27*   < > 41*  CREATININE 1.28*   < > 1.34*  CALCIUM 8.8*   < > 8.5*  AST 35  --   --  ALT 22  --   --   ALKPHOS 91  --   --   BILITOT 1.0  --   --    < > = values in this interval not displayed.   Cardiac Enzymes Recent Labs  Lab 11/14/18 1204  TROPONINI 0.04*   RADIOLOGY:  Ct Abdomen Pelvis Wo Contrast  Result Date: 11/13/2018 CLINICAL DATA:  Pain status post fall EXAM: CT CHEST, ABDOMEN AND PELVIS WITHOUT CONTRAST TECHNIQUE: Multidetector CT imaging of the chest, abdomen and pelvis was performed following the standard protocol  without IV contrast. COMPARISON:  06/29/2018 FINDINGS: CT CHEST FINDINGS Cardiovascular: The heart is enlarged. The main pulmonary artery is dilated measuring up to 3.3 cm. Advanced coronary and aortic calcifications are noted. There is a dual chamber left-sided pacemaker in place. Mediastinum/Nodes: Multiple prominent lymph nodes are noted. There are no pathologically enlarged axillary or supraclavicular lymph nodes. Lungs/Pleura: There is bilateral interlobular septal thickening with ground-glass appearance of the upper lobes. There are small bilateral pleural effusions, right greater than left. There are few scattered additional ground-glass opacities. For example there is a 1 cm ground-glass opacity in the right middle lobe (axial series 6, image 83). There is atelectasis at the lung bases. No pneumothorax. Musculoskeletal: No chest wall mass or suspicious bone lesions identified. CT ABDOMEN PELVIS FINDINGS Hepatobiliary: The liver is unremarkable. There is cholelithiasis without CT evidence of acute cholecystitis. The common bile duct is poorly evaluated secondary to motion artifact. Pancreas: Unremarkable. No pancreatic ductal dilatation or surrounding inflammatory changes. Spleen: Normal in size without focal abnormality. Adrenals/Urinary Tract: Adrenal glands are unremarkable. Kidneys are normal, without renal calculi, focal lesion, or hydronephrosis. Right-sided renal cysts are again noted. Bladder is unremarkable. Stomach/Bowel: There are scattered colonic diverticula without CT evidence of diverticulitis. There is a wide-mouth ventral wall hernia containing a loop of the transverse colon without evidence of an obstruction. The patient appears to be status post prior appendectomy. There is no evidence of a small-bowel obstruction. Vascular/Lymphatic: Aortic atherosclerosis. No enlarged abdominal or pelvic lymph nodes. Reproductive: Status post hysterectomy. No adnexal masses. There is a small amount of  free fluid in the pelvis. Other: There is a small volume of ascites, most notably in the upper abdomen. Musculoskeletal: Extensive posterior fusion hardware is again noted. There is stable height loss of the T11 vertebral body and the L2 vertebral bodies. The hardware appears intact without evidence of a fracture. IMPRESSION: 1. Cardiomegaly with findings concerning for pulmonary edema. There are small bilateral pleural effusions. 2. There is a 1 cm ground-glass opacity in the right middle lobe that is favored to be secondary to pulmonary edema. A three-month follow-up CT of the chest is recommended to confirm resolution of this finding. 3. No acute intra-abdominal abnormality detected. 4. No displaced fractures, however fracture detection is limited by diffuse osteopenia. 5. Cholelithiasis without CT evidence of acute cholecystitis. 6. Trace abdominal ascites. Aortic Atherosclerosis (ICD10-I70.0). Electronically Signed   By: Constance Holster M.D.   On: 11/13/2018 20:56   Ct Head Wo Contrast  Result Date: 11/13/2018 CLINICAL DATA:  Fall last night with head injury. EXAM: CT HEAD WITHOUT CONTRAST CT CERVICAL SPINE WITHOUT CONTRAST TECHNIQUE: Multidetector CT imaging of the head and cervical spine was performed following the standard protocol without intravenous contrast. Multiplanar CT image reconstructions of the cervical spine were also generated. COMPARISON:  Cervical spine CT 09/02/2018. Head CT 01/10/2018. FINDINGS: CT HEAD FINDINGS Brain: There is no evidence of acute infarct, intracranial hemorrhage, mass, midline shift,  or extra-axial fluid collection. The ventricles and sulci are normal. Minimal hypoattenuation in the cerebral white matter is unchanged and not considered abnormal for age. Vascular: Calcified atherosclerosis at the skull base. No hyperdense vessel. Skull: No fracture or focal osseous lesion. Sinuses/Orbits: Visualized paranasal sinuses and mastoid air cells are clear. Bilateral cataract  extraction is noted. Other: None. CT CERVICAL SPINE FINDINGS The study is mildly motion degraded despite repeat imaging through the upper cervical spine. Alignment: 2 mm anterolisthesis of C4 on C5, stable to minimally increased and likely degenerative. Skull base and vertebrae: No acute fracture is identified within limitations of motion artifact. No destructive osseous lesion. Soft tissues and spinal canal: No prevertebral fluid or swelling. No visible canal hematoma. Disc levels: Mild disc and mild-to-moderate facet degeneration in the cervical spine. Upper chest: Respiratory motion artifact through the upper lobes with asymmetric ground-glass/mosaic attenuation on the right, also present on the prior spine CT. 1 cm focal consolidative opacity in the right apex. The lungs are more fully evaluated on today's chest radiograph. Other: Carotid artery atherosclerosis. IMPRESSION: 1. No evidence of acute intracranial abnormality. 2. Motion degraded cervical spine CT without evidence of acute fracture. Electronically Signed   By: Logan Bores M.D.   On: 11/13/2018 17:09   Ct Chest Wo Contrast  Result Date: 11/13/2018 CLINICAL DATA:  Pain status post fall EXAM: CT CHEST, ABDOMEN AND PELVIS WITHOUT CONTRAST TECHNIQUE: Multidetector CT imaging of the chest, abdomen and pelvis was performed following the standard protocol without IV contrast. COMPARISON:  06/29/2018 FINDINGS: CT CHEST FINDINGS Cardiovascular: The heart is enlarged. The main pulmonary artery is dilated measuring up to 3.3 cm. Advanced coronary and aortic calcifications are noted. There is a dual chamber left-sided pacemaker in place. Mediastinum/Nodes: Multiple prominent lymph nodes are noted. There are no pathologically enlarged axillary or supraclavicular lymph nodes. Lungs/Pleura: There is bilateral interlobular septal thickening with ground-glass appearance of the upper lobes. There are small bilateral pleural effusions, right greater than left.  There are few scattered additional ground-glass opacities. For example there is a 1 cm ground-glass opacity in the right middle lobe (axial series 6, image 83). There is atelectasis at the lung bases. No pneumothorax. Musculoskeletal: No chest wall mass or suspicious bone lesions identified. CT ABDOMEN PELVIS FINDINGS Hepatobiliary: The liver is unremarkable. There is cholelithiasis without CT evidence of acute cholecystitis. The common bile duct is poorly evaluated secondary to motion artifact. Pancreas: Unremarkable. No pancreatic ductal dilatation or surrounding inflammatory changes. Spleen: Normal in size without focal abnormality. Adrenals/Urinary Tract: Adrenal glands are unremarkable. Kidneys are normal, without renal calculi, focal lesion, or hydronephrosis. Right-sided renal cysts are again noted. Bladder is unremarkable. Stomach/Bowel: There are scattered colonic diverticula without CT evidence of diverticulitis. There is a wide-mouth ventral wall hernia containing a loop of the transverse colon without evidence of an obstruction. The patient appears to be status post prior appendectomy. There is no evidence of a small-bowel obstruction. Vascular/Lymphatic: Aortic atherosclerosis. No enlarged abdominal or pelvic lymph nodes. Reproductive: Status post hysterectomy. No adnexal masses. There is a small amount of free fluid in the pelvis. Other: There is a small volume of ascites, most notably in the upper abdomen. Musculoskeletal: Extensive posterior fusion hardware is again noted. There is stable height loss of the T11 vertebral body and the L2 vertebral bodies. The hardware appears intact without evidence of a fracture. IMPRESSION: 1. Cardiomegaly with findings concerning for pulmonary edema. There are small bilateral pleural effusions. 2. There is a 1 cm ground-glass  opacity in the right middle lobe that is favored to be secondary to pulmonary edema. A three-month follow-up CT of the chest is recommended  to confirm resolution of this finding. 3. No acute intra-abdominal abnormality detected. 4. No displaced fractures, however fracture detection is limited by diffuse osteopenia. 5. Cholelithiasis without CT evidence of acute cholecystitis. 6. Trace abdominal ascites. Aortic Atherosclerosis (ICD10-I70.0). Electronically Signed   By: Constance Holster M.D.   On: 11/13/2018 20:56   Ct Cervical Spine Wo Contrast  Result Date: 11/13/2018 CLINICAL DATA:  Fall last night with head injury. EXAM: CT HEAD WITHOUT CONTRAST CT CERVICAL SPINE WITHOUT CONTRAST TECHNIQUE: Multidetector CT imaging of the head and cervical spine was performed following the standard protocol without intravenous contrast. Multiplanar CT image reconstructions of the cervical spine were also generated. COMPARISON:  Cervical spine CT 09/02/2018. Head CT 01/10/2018. FINDINGS: CT HEAD FINDINGS Brain: There is no evidence of acute infarct, intracranial hemorrhage, mass, midline shift, or extra-axial fluid collection. The ventricles and sulci are normal. Minimal hypoattenuation in the cerebral white matter is unchanged and not considered abnormal for age. Vascular: Calcified atherosclerosis at the skull base. No hyperdense vessel. Skull: No fracture or focal osseous lesion. Sinuses/Orbits: Visualized paranasal sinuses and mastoid air cells are clear. Bilateral cataract extraction is noted. Other: None. CT CERVICAL SPINE FINDINGS The study is mildly motion degraded despite repeat imaging through the upper cervical spine. Alignment: 2 mm anterolisthesis of C4 on C5, stable to minimally increased and likely degenerative. Skull base and vertebrae: No acute fracture is identified within limitations of motion artifact. No destructive osseous lesion. Soft tissues and spinal canal: No prevertebral fluid or swelling. No visible canal hematoma. Disc levels: Mild disc and mild-to-moderate facet degeneration in the cervical spine. Upper chest: Respiratory motion  artifact through the upper lobes with asymmetric ground-glass/mosaic attenuation on the right, also present on the prior spine CT. 1 cm focal consolidative opacity in the right apex. The lungs are more fully evaluated on today's chest radiograph. Other: Carotid artery atherosclerosis. IMPRESSION: 1. No evidence of acute intracranial abnormality. 2. Motion degraded cervical spine CT without evidence of acute fracture. Electronically Signed   By: Logan Bores M.D.   On: 11/13/2018 17:09   Dg Chest Portable 1 View  Result Date: 11/13/2018 CLINICAL DATA:  Shortness of breath. History of COPD. EXAM: PORTABLE CHEST 1 VIEW COMPARISON:  09/02/2018 FINDINGS: A dual lead pacemaker remains in place. The cardiomediastinal silhouette is unchanged allowing for leftward patient rotation on today's study with cardiomegaly and aortic atherosclerosis again noted. The interstitial markings remain mildly increased diffusely with mild central pulmonary vascular congestion. No airspace consolidation, pleural effusion, pneumothorax is identified. Prior posterior thoracolumbar spinal fusion is noted. IMPRESSION: Cardiomegaly with suspected mild interstitial edema. Electronically Signed   By: Logan Bores M.D.   On: 11/13/2018 16:56   Dg Hip Unilat With Pelvis 2-3 Views Right  Result Date: 11/14/2018 CLINICAL DATA:  Recent falls with right-sided hip pain, initial encounter EXAM: DG HIP (WITH OR WITHOUT PELVIS) 2-3V RIGHT COMPARISON:  None. FINDINGS: Pelvic ring is intact. No acute fracture or dislocation is noted. No soft tissue abnormality is seen. IMPRESSION: No acute abnormality noted. Electronically Signed   By: Inez Catalina M.D.   On: 11/14/2018 00:56   ASSESSMENT AND PLAN:  Cambrey Lupi  is a 85 y.o. female with a known history of CHF, diabetes mellitus, COPD, peripheral vascular disease.  Patient was sent to the emergency room from assisted living home for decreased oxygen  saturation  1.  Acute on chronic systolic CHF-  most recent ejection fraction 40% - Will change Lasix to IV 80 mg daily, creat slightly up - Coreg continued - Echocardiogram  Showed The left ventricle has mild-moderately reduced systolic function, with an ejection fraction of 40-45%. The cavity size was normal. There is moderately increased left ventricular wall thickness. Left ventricular diastolic parameters were normal.  2. The right ventricle has moderately reduced systolic function. - Continue serial troponin levels 0.05--0.05--0.04 - Oxygen per nasal cannula down to 2L -DuoNeb every 6 hours as needed for shortness of breath -UOP 1300 cc  2. fall - No evidence of acute intracranial abnormality or spinal fracture. - Left hip x-ray is neg for fracture - Physical therapy consultation for generalized weakness and impaired mobility--recommends Rehab.   3.  Atrial fibrillation - With history of pacemaker placement - Xarelto continued  4.  Diabetes mellitus --Moderate sliding scale insulin - Metformin and Lantus insulin have been continued  5. Overall deconditioning with bilateral lower extremity edema and leg wounds. -Wound consult. Patient follows at wound clinic.  I spoke with patient's daughter at length. Updated.  Case discussed with Care Management/Social Worker. Management plans discussed with the patient, family and they are in agreement.  CODE STATUS: dnr  DVT Prophylaxis: xarelto  TOTAL TIME TAKING CARE OF THIS PATIENT: *30* minutes.  >50% time spent on counselling and coordination of care  POSSIBLE D/C IN *2-3* DAYS, DEPENDING ON CLINICAL CONDITION.  Note: This dictation was prepared with Dragon dictation along with smaller phrase technology. Any transcriptional errors that result from this process are unintentional.  [name] M.D on 11/15/2018 at 12:58 PM  Between 7am to 6pm - Pager - [phone]  After 6pm go to [email or web address] - password [email or web address] [facility]  Office   [phone]  CC: Primary care physician; Tracie Harrier, MDPatient ID: [name], female   DOB: 06-04-1925, 85 y.o.   MRN: [id number]

## 2018-11-15 NOTE — Plan of Care (Signed)
  Problem: Education: Goal: Knowledge of General Education information will improve Description: Including pain rating scale, medication(s)/side effects and non-pharmacologic comfort measures Outcome: Progressing   Problem: Pain Managment: Goal: General experience of comfort will improve Outcome: Progressing   Problem: Safety: Goal: Ability to remain free from injury will improve Outcome: Progressing   

## 2018-11-15 NOTE — TOC Progression Note (Signed)
Transition of Care [facility]) - Progression Note    Patient Details  [name] MRN: [id number] Date of Birth: 09-04-1924  Transition of [facility]) CM/SW Contact  [name], [place] Phone Number: 11/15/2018, 6:09 PM  Clinical Narrative:     PT worked with patient and recommended SNF for short term rehab before returning back to [facility] ALF.  CSW spoke with patient's daughter, patient's daughter is agreeable to having patient go to SNF before returning back to ALF.  CSW informed daughter about how insurance will pay for stay, and what to expect at SNF.  Patient's daughter stated she would like either [facility] or [facility] if possible.  CSW told daughter it will depend on who has beds available.  Daughter expressed understanding, she gave CSW permission to begin bed search in [place].  Expected Discharge Plan: Skilled Nursing Facility Barriers to Discharge: Continued Medical Work up, SNF Pending bed offer, Ship broker  Expected Discharge Plan and Services Expected Discharge Plan: [place]   Discharge Planning Services: CM Consult Post Acute Care Choice: Home Health, Resumption of Svcs/PTA Provider Living arrangements for the past 2 months: [place]: [facility] Date [place]: 11/14/18 Time [place]: [phone] Representative spoke with at [place] Determinants of Health ([place]) Interventions    Readmission Risk Interventions Readmission Risk Prevention Plan 11/14/2018 09/07/2018 09/04/2018  Transportation Screening Complete - Complete  Medication Review Press photographer) Complete - Complete  PCP or Specialist appointment within 3-5 days of discharge - Complete -  [place] or Home Care Consult Complete - Complete  SW Recovery Care/Counseling Consult - - Complete  Palliative Care Screening Complete - Complete  Skilled Nursing  Facility Complete - Not Complete  SNF Comments - - PT recommended home health, patient is from [facility].  Some recent data might be hidden

## 2018-11-15 NOTE — Progress Notes (Signed)
Pt had an assisted fall to floor @1620 . Both myself and NT Corey Skains were present during fall. We were attempting to help pt from [facility] to bed with walker when pt knees buckled and we could not hold pt up any longer. At that time I moved behind pt and slid her safely to ground down my body with the help of NT. Once pt was on ground I made sure pt was OK and called for help. CN, and two others came to room to help life pt to bed. Pt was unable to help [place] lift her from ground. Pt placed back in low bed. Assessment on pt completed. Pt c/o 10/10 pain in L ankle. MD made aware. STAT ankle xray ordered, along with a one time dose of 15mg  Toradol. Pt already in low bed with floor mats implemented and yellow socks and bracelet. Pt was also previously already a high risk pt in file. Bed in lowest position. Daughter [name] called and message left for her to return my call. Will continue to monitor.

## 2018-11-15 NOTE — Evaluation (Signed)
Physical Therapy Evaluation Patient Details [name] MRN: [id number] DOB: August 31, 1924 Today's Date: 11/15/2018   History of Present Illness   85 y.o. female with a known history of CHF, diabetes mellitus, pacemaker, low back pain, COPD, peripheral vascular disease.  Patient was sent to the emergency room from assisted living home for decreased oxygen saturation, and pt reported falling  in the last day or two prior to admission.     Clinical Impression  Pt easily woken, oriented to self, situation, behavior WFLs. Pt reported with prompting that she does live at [facility] (originially provided home address) with her husband who is unable to physically assist her. Pt stated she is independent with ADLs, chart review from two months ago indicated ALF staff assists with ADLs. Pt uses rollator at baseline for mobility.   Pt distraught about lost hearing aide throughout session, but PT able to find it in bed. Much improved communication with hearing aide in place. Supine to sit with supervision and heavy use of bed rails, progressed to sitting EOB with fair balance. With sit to stand, RW and minA provided due to posterior LOB, pt also used posterior LE to stabilize against bed. Pt able to take small steps towards chair with RW And CGA, but while turning experienced a loss of balance. MaxA to control descent to chair.  Overall the patient demonstrated deficits (see "PT Problem List") that impede the patient's functional abilities, safety, and mobility and would benefit from skilled PT intervention. Recommendation is STR due to current level of assistance needed and safety concerns due to increase in recent falls.       Follow Up Recommendations SNF    Equipment Recommendations  Other (comment)(TBD, but pt already has WC and RW)    Recommendations for Other Services       Precautions / Restrictions Precautions Precautions: Fall Restrictions Weight Bearing Restrictions: No       Mobility  Bed Mobility Overal bed mobility: Needs Assistance Bed Mobility: Supine to Sit     Supine to sit: Supervision     General bed mobility comments: heavy use of bed rails  Transfers Overall transfer level: Needs assistance Equipment used: Rolling walker (2 wheeled) Transfers: Sit to/from Stand Sit to Stand: Min assist         General transfer comment: shoes donned at  request of pt. minA for posterior loss of balance, pt able to assist with correcting, also uses posterior legs to stabilize against bed   Ambulation/Gait Ambulation/Gait assistance: Max assist Gait Distance (Feet): 2 Feet Assistive device: Rolling walker (2 wheeled)       General Gait Details: Pt able to take small steps towards chair, but while turning experienced a loss of balance. MaxA to control descent to chair. Pt reported fatigue  Stairs            Wheelchair Mobility    Modified Rankin (Stroke Patients Only)       Balance Overall balance assessment: Needs assistance   Sitting balance-Leahy Scale: Fair       Standing balance-Leahy Scale: Zero                               Pertinent Vitals/Pain Pain Assessment: No/denies pain    Home Living Family/patient expects to be discharged to:: Assisted living               Home Equipment: Walker - 2 wheels;Wheelchair - manual  Additional Comments: one entrance is ramped    Prior Function Level of Independence: Needs assistance   Gait / Transfers Assistance Needed: Pt used to walk to dining facility, now meals are brought to room  ADL's / Homemaking Assistance Needed: Pt stated that she is independent for ADLs, has trouble with her socks. Chart review indicated ALF staff assists with all ADLs  Comments: Pt stated that she has had a several falls recently     Hand Dominance   Dominant Hand: Right    Extremity/Trunk Assessment   Upper Extremity Assessment Upper Extremity Assessment: Generalized  weakness    Lower Extremity Assessment Lower Extremity Assessment: Generalized weakness    Cervical / Trunk Assessment Cervical / Trunk Assessment: Kyphotic  Communication   Communication: HOH  Cognition Arousal/Alertness: Awake/alert Behavior During Therapy: WFL for tasks assessed/performed Overall Cognitive Status: Within Functional Limits for tasks assessed                                        General Comments      Exercises     Assessment/Plan    PT Assessment Patient needs continued PT services  PT Problem List Decreased strength;Decreased mobility;Decreased safety awareness;Decreased range of motion;Decreased activity tolerance;Decreased balance;Decreased knowledge of use of DME       PT Treatment Interventions DME instruction;Functional mobility training;Patient/family education;Balance training;Gait training;Therapeutic activities;Neuromuscular re-education;Stair training;Therapeutic exercise    PT Goals (Current goals can be found in the Care Plan section)  Acute Rehab PT Goals Patient Stated Goal: to go home PT Goal Formulation: With patient Time For Goal Achievement: 11/29/18 Potential to Achieve Goals: Good    Frequency Min 2X/week   Barriers to discharge Decreased caregiver support      Co-evaluation               AM-PAC PT "6 Clicks" Mobility  Outcome Measure Help needed turning from your back to your side while in a flat bed without using bedrails?: A Lot Help needed moving from lying on your back to sitting on the side of a flat bed without using bedrails?: A Lot Help needed moving to and from a bed to a chair (including a wheelchair)?: A Lot Help needed standing up from a chair using your arms (e.g., wheelchair or bedside chair)?: A Lot Help needed to walk in hospital room?: A Lot Help needed climbing 3-5 steps with a railing? : Total 6 Click Score: 11    End of Session Equipment Utilized During Treatment: Gait  belt;Oxygen(2L) Activity Tolerance: Patient limited by fatigue Patient left: with call bell/phone within reach;in chair;with chair alarm set Nurse Communication: Mobility status;Precautions PT Visit Diagnosis: Unsteadiness on feet (R26.81);Muscle weakness (generalized) (M62.81);History of falling (Z91.81);Other abnormalities of gait and mobility (R26.89)    Time: 4854-6270 PT Time Calculation (min) (ACUTE ONLY): 27 min   Charges:   PT Evaluation $PT Eval Moderate Complexity: 1 Mod PT Treatments $Therapeutic Activity: 8-22 mins        Lieutenant Diego PT, DPT 11:51 AM,11/15/18 [phone]

## 2018-11-15 NOTE — NC FL2 (Signed)
[place] LEVEL OF CARE SCREENING TOOL     IDENTIFICATION  Patient [name] Birthdate: March 08, 1925 Sex: female Admission Date (Current Location): 11/13/2018  [place] and [place] Number:  Engineering geologist and Address:  [facility], [address] [place], [place] [address]      Provider Number: [id number]  Attending Physician Name and Address:  [name], MD  Relative Name and Phone Number:  [name],Victoria Daughter [phone]  [phone]     Current Level of Care: Hospital Recommended Level of Care: [place] Prior Approval Number:    Date Approved/Denied:   PASRR Number: [id number]  Discharge Plan: SNF    Current Diagnoses: Patient Active Problem List   Diagnosis Date Noted  . Acute on chronic diastolic CHF (congestive heart failure) ([place]) 11/13/2018  . Dyspnea 07/23/2018  . Weakness generalized 07/23/2018  . Goals of care, counseling/discussion   . Palliative care encounter   . Influenza A 07/05/2018  . CHF exacerbation ([place]) 06/14/2018  . Cellulitis of left lower extremity 06/02/2018  . ARF (acute renal failure) ([place]) 05/17/2018  . Pneumonia 12/11/2017  . CAP (community acquired pneumonia) 04/14/2017  . COPD (chronic obstructive pulmonary disease) ([place]) 12/07/2016  . UTI (urinary tract infection) 12/07/2016  . Ventral hernia without obstruction or gangrene   . Hyponatremia 09/07/2015  . Acute respiratory failure ([place]) 09/01/2015  . Atrial fibrillation ([place]) 04/10/2015  . Essential hypertension 04/10/2015  . Diabetes ([place]) 04/10/2015  . Acute on chronic combined systolic and diastolic CHF (congestive heart failure) ([place]) 03/21/2015  . Pulmonary nodule 11/10/2014    Orientation RESPIRATION BLADDER Height & Weight     Self, Time, Situation, Place  O2(2L) Incontinent Weight: 195 lb (88.5 kg) Height:  5\' 7"  (170.2 cm)  BEHAVIORAL SYMPTOMS/MOOD NEUROLOGICAL BOWEL NUTRITION STATUS       Continent Diet(Cardiac diet)  AMBULATORY STATUS COMMUNICATION OF NEEDS Skin   Limited Assist Verbally Surgical wounds, Skin abrasions                       Personal Care Assistance Level of Assistance  Bathing, Feeding, Dressing Bathing Assistance: Limited assistance Feeding assistance: Independent Dressing Assistance: Limited assistance     Functional Limitations Info  Sight, Hearing, Speech Sight Info: Adequate Hearing Info: Adequate Speech Info: Adequate    SPECIAL CARE FACTORS FREQUENCY  PT (By licensed PT), OT (By licensed OT)     PT Frequency: Minimum 5x a week OT Frequency: Minimum 5x a week            Contractures Contractures Info: Not present    Additional Factors Info  Code Status, Allergies Code Status Info: DNR Allergies Info: CEFTRIAXONE, CEFUROXIME, CEPHALOSPORINS, CODEINE, EPINEPHRINE, MORPHINE AND RELATED, BUPRENORPHINE HCL, CIPROFLOXACIN, LATEX, LIDOCAINE, PROCAINE           Current Medications (11/15/2018):  This is the current hospital active medication list Current Facility-Administered Medications  Medication Dose Route Frequency Provider Last Rate Last Dose  . 0.9 %  sodium chloride infusion  250 mL Intravenous PRN Seals, Levada Dy H, NP      . acetaminophen (TYLENOL) tablet 650 mg  650 mg Oral Q4H PRN Gardiner Barefoot H, NP   650 mg at 11/14/18 0852  . aspirin EC tablet 81 mg  81 mg Oral Daily Mayer Camel, NP   81 mg at 11/15/18 0851  . buPROPion (WELLBUTRIN XL) 24 hr tablet 300 mg  300 mg Oral Daily Seals, Theo Dills, NP  300 mg at 11/15/18 0852  . busPIRone (BUSPAR) tablet 5 mg  5 mg Oral BID Gardiner Barefoot H, NP   5 mg at 11/15/18 0850  . carvedilol (COREG) tablet 6.25 mg  6.25 mg Oral BID WC Seals, Angela H, NP   6.25 mg at 11/15/18 1702  . docusate sodium (COLACE) capsule 100 mg  100 mg Oral BID Gardiner Barefoot H, NP   100 mg at 11/15/18 0851  . escitalopram (LEXAPRO) tablet 10 mg  10 mg Oral Daily Seals, Theo Dills, NP   10 mg at  11/15/18 0851  . ferrous sulfate tablet 325 mg  325 mg Oral Q breakfast Mayer Camel, NP   325 mg at 11/15/18 0851  . [START ON 11/16/2018] furosemide (LASIX) injection 80 mg  80 mg Intravenous Daily [name], MD      . gabapentin (NEURONTIN) capsule 100 mg  100 mg Oral QHS Seals, Angela H, NP   100 mg at 11/14/18 2131  . insulin aspart (novoLOG) injection 0-15 Units  0-15 Units Subcutaneous TID WC Seals, Theo Dills, NP   5 Units at 11/15/18 1653  . insulin aspart (novoLOG) injection 0-5 Units  0-5 Units Subcutaneous QHS Seals, Angela H, NP      . insulin glargine (LANTUS) injection 10 Units  10 Units Subcutaneous QHS Mayer Camel, NP   10 Units at 11/14/18 2132  . ipratropium-albuterol (DUONEB) 0.5-2.5 (3) MG/3ML nebulizer solution 3 mL  3 mL Nebulization Q6H PRN Seals, Theo Dills, NP      . loratadine (CLARITIN) tablet 10 mg  10 mg Oral Daily Seals, Angela H, NP   10 mg at 11/15/18 0851  . Melatonin TABS 5 mg  5 mg Oral QHS Seals, Angela H, NP   5 mg at 11/14/18 2131  . [START ON 11/16/2018] multivitamin with minerals tablet 1 tablet  1 tablet Oral Daily [name], MD      . ondansetron [facility]) injection 4 mg  4 mg Intravenous Q6H PRN Mayer Camel, NP   4 mg at 11/14/18 0909  . pantoprazole (PROTONIX) EC tablet 40 mg  40 mg Oral Daily Seals, Levada Dy H, NP   40 mg at 11/15/18 0851  . [START ON 11/16/2018] protein supplement (ENSURE MAX) liquid  11 oz Oral BID BM [name], MD      . Rivaroxaban [facility]) tablet 15 mg  15 mg Oral Q supper Gardiner Barefoot H, NP   15 mg at 11/15/18 1702  . rOPINIRole (REQUIP) tablet 0.5 mg  0.5 mg Oral QHS Seals, Angela H, NP   0.5 mg at 11/14/18 2131  . senna (SENOKOT) tablet 8.6 mg  1 tablet Oral Daily Seals, Angela H, NP   8.6 mg at 11/15/18 6606  . sodium chloride flush (NS) 0.9 % injection 3 mL  3 mL Intravenous Q12H Seals, Angela H, NP   3 mL at 11/15/18 0853  . sodium chloride flush (NS) 0.9 % injection 3 mL  3 mL Intravenous PRN Seals, Levada Dy H, NP       . traMADol (ULTRAM) tablet 50 mg  50 mg Oral Q6H PRN [name], MD   50 mg at 11/15/18 1141  . traZODone (DESYREL) tablet 50 mg  50 mg Oral QHS Seals, Angela H, NP   50 mg at 11/14/18 2131  . vitamin B-12 (CYANOCOBALAMIN) tablet 1,000 mcg  1,000 mcg Oral Daily Seals, Theo Dills, NP   1,000 mcg at 11/15/18 3016  . vitamin C (ASCORBIC  ACID) tablet 500 mg  500 mg Oral Daily Seals, Theo Dills, NP   500 mg at 11/15/18 0851  . [START ON 11/26/2018] Vitamin D (Ergocalciferol) (DRISDOL) capsule 50,000 Units  50,000 Units Oral Q Thu Seals, Theo Dills, NP         Discharge Medications: Please see discharge summary for a list of discharge medications.  Relevant Imaging Results:  Relevant Lab Results:   Additional Information SSN [id number]  [name], LCSW

## 2018-11-16 LAB — BASIC METABOLIC PANEL
Anion gap: 9 (ref 5–15)
BUN: 42 mg/dL — ABNORMAL HIGH (ref 8–23)
CO2: 33 mmol/L — ABNORMAL HIGH (ref 22–32)
Calcium: 8.7 mg/dL — ABNORMAL LOW (ref 8.9–10.3)
Chloride: 98 mmol/L (ref 98–111)
Creatinine, Ser: 1.24 mg/dL — ABNORMAL HIGH (ref 0.44–1.00)
GFR calc Af Amer: 44 mL/min — ABNORMAL LOW (ref >60)
GFR calc non Af Amer: 38 mL/min — ABNORMAL LOW (ref >60)
Glucose, Bld: 212 mg/dL — ABNORMAL HIGH (ref 70–99)
Potassium: 4.1 mmol/L (ref 3.5–5.1)
Sodium: 140 mmol/L (ref 135–145)

## 2018-11-16 LAB — GLUCOSE, CAPILLARY
Glucose-Capillary: 167 mg/dL — ABNORMAL HIGH (ref 70–99)
Glucose-Capillary: 212 mg/dL — ABNORMAL HIGH (ref 70–99)
Glucose-Capillary: 295 mg/dL — ABNORMAL HIGH (ref 70–99)
Glucose-Capillary: 194 mg/dL — ABNORMAL HIGH (ref 70–99)

## 2018-11-16 MED ORDER — MAGNESIUM HYDROXIDE 400 MG/5ML PO SUSP
30.0000 mL | Freq: Every day | ORAL | Status: DC | PRN
  Filled 2018-11-16: qty 30

## 2018-11-16 MED ORDER — COLLAGENASE 250 UNIT/GM EX OINT
TOPICAL_OINTMENT | Freq: Every day | CUTANEOUS | Status: DC
  Administered 2018-11-16 – 2018-11-17 (×2): via TOPICAL
  Filled 2018-11-16: qty 30

## 2018-11-16 NOTE — Progress Notes (Signed)
Applied wound dressings x 2 at this time, as ordered Q daily. Had to use a 4x4 in place of the one 2x2 because of size of the wound (LLE). Patient tolerated well. Reapplied socks. Will pass along in shift report. Will continue to monitor wound dressing status(es) for the remainder of shift. Wenda Low [facility]

## 2018-11-16 NOTE — TOC Progression Note (Signed)
Transition of Care [facility]) - Progression Note    Patient Details  Name: Gayl Ivanoff MRN: [id number] Date of Birth: 09-29-1924  Transition of Care [facility]) CM/SW Contact  [name], [place] Phone Number: 11/16/2018, 3:23 PM  Clinical Narrative:   CSW spoke to patient's daughter [name], [place] informed her that [facility] accepted patient.  CSW contacted [facility], and they will start insurance authorization.  Patient's daughter was informed how insurance will pay for the stay and what to expect.  CSW to continue to facilitate discharge planning.   Expected Discharge Plan: Skilled Nursing Facility Barriers to Discharge: Continued Medical Work up, SNF Pending bed offer, Ship broker  Expected Discharge Plan and Services Expected Discharge Plan: [place]   Discharge Planning Services: CM Consult Post Acute Care Choice: Home Health, Resumption of Svcs/PTA Provider Living arrangements for the past 2 months: [place]: [facility] Date [place]: 11/14/18 Time [place]: [phone] Representative spoke with at [place] Determinants of Health ([place]) Interventions    Readmission Risk Interventions Readmission Risk Prevention Plan 11/14/2018 09/07/2018 09/04/2018  Transportation Screening Complete - Complete  Medication Review Press photographer) Complete - Complete  PCP or Specialist appointment within 3-5 days of discharge - Complete -  [place] or Home Care Consult Complete - Complete  SW Recovery Care/Counseling Consult - - Complete  Palliative Care Screening Complete - Complete  Skilled Nursing Facility Complete - Not Complete  SNF Comments - - PT recommended home health, patient is from [facility]F.  Some recent data might be hidden

## 2018-11-16 NOTE — Care Management Important Message (Signed)
Important Message  Patient Details  [name] MRN: [id number] Date of Birth: April 18, 1925   Medicare Important Message Given:  Yes    Dannette Barbara 11/16/2018, 3:05 PM

## 2018-11-16 NOTE — Consult Note (Signed)
[place] Nurse wound consult note Reason for Consult: LE wounds  Patient has been followed in the wound care center x 2 visits. I have reviewed the images and notes from the wound care center. The wound on the left lateral leg is very dry at the time of my assessment and just minimal drainage note otherwise. Eschar over the right pretibial wounds  Wound type: Trauma; full thickness ulcerations. Non healing  Pressure Injury POA: /NA Measurement: Left lateral leg: 7.5cm x 3cm x 0.4cm  Right pretibial: two scabbed areas; 1cm x 1cm x 0cm  Wound bed: Left lateral leg: 100% dry, crater like wound Right pretibial: 2 100% scabbed/eschar areas Drainage (amount, consistency, odor) none, some minor oozing from the distal right pretibial wound Periwound: intact, palpable distal pulses  ABIs have been obtained and area normal  Dressing procedure/placement/frequency:  Enzymatic debridement to the bilateral LE wounds. She was using aquacel AG+- however she has no drainage and this dressing is for draining wounds.  Will not order kerlix and coban at this time, can resume this when the patient is DC back to home.   Discussed POC with patient and bedside nurse.  Re consult if needed, will not follow at this time. Thanks  Undine Nealis [employer], RN,CWOCN, CNS, [place] [phone])

## 2018-11-16 NOTE — Progress Notes (Signed)
[place] [facility] [place] [name]    MR#:  [id number]  DATE OF BIRTH:  12-Jul-1924  SUBJECTIVE:   Came in with increasing shortness of breath and leg swelling and fatigue ability. Found to be in heart failure. Had a fall y'day while being assited by staff. No injury 2 liter [place] oxygen  REVIEW OF SYSTEMS:   Review of Systems  Constitutional: Negative for chills, fever and weight loss.  HENT: Negative for ear discharge, ear pain and nosebleeds.   Eyes: Negative for blurred vision, pain and discharge.  Respiratory: Positive for shortness of breath. Negative for sputum production, wheezing and stridor.   Cardiovascular: Negative for chest pain, palpitations, orthopnea and PND.  Gastrointestinal: Negative for abdominal pain, diarrhea, nausea and vomiting.  Genitourinary: Negative for frequency and urgency.  Musculoskeletal: Positive for falls and joint pain. Negative for back pain.  Neurological: Positive for weakness. Negative for sensory change, speech change and focal weakness.  Psychiatric/Behavioral: Negative for depression and hallucinations. The patient is not nervous/anxious.    Tolerating Diet:yes Tolerating PJ:[id number] SNF  DRUG ALLERGIES:   Allergies  Allergen Reactions  . Ceftriaxone Swelling    Tolerates piperacillin/tazobactam, PCN G  . Cefuroxime Hives    Tolerates piperacillin/tazobactam, PCN G  . Cephalosporins Swelling and Other (See Comments)    Reaction:  Fainting/dry mouth  Tolerates piperacillin/tazobactam, PCN G  . Codeine Nausea And Vomiting  . Epinephrine Other (See Comments)    Reaction:  Fainting   . Morphine And Related Other (See Comments)    Reaction:  GI upset   . Buprenorphine Hcl Nausea And Vomiting  . Ciprofloxacin Itching, Swelling and Rash  . Latex Rash  . Lidocaine Rash  . Procaine Rash    VITALS:  Blood pressure (!) 147/70, pulse 69, temperature (!) 97.4 F (36.3 C),  temperature source Oral, resp. rate 16, height 5\' 7"  (1.702 m), weight 88 kg, SpO2 100 %.  PHYSICAL EXAMINATION:   Physical Exam  GENERAL:  85 y.o.-year-old patient lying in the bed with no acute distress. Appears weak EYES: Pupils equal, round, reactive to light and accommodation. No scleral icterus. Extraocular muscles intact.  HEENT: Head atraumatic, normocephalic. Oropharynx and nasopharynx clear.  NECK:  Supple, no jugular venous distention. No thyroid enlargement, no tenderness.  LUNGS: decreased breath sounds bilaterally, no wheezing, rales, rhonchi. No use of accessory muscles of respiration.  CARDIOVASCULAR: S1, S2 normal. No murmurs, rubs, or gallops.  ABDOMEN: Soft, nontender, nondistended. Bowel sounds present. No organomegaly or mass.  EXTREMITIES:      NEUROLOGIC: Cranial nerves II through XII are intact. No focal Motor or sensory deficits b/l.   PSYCHIATRIC:  patient is alert and oriented x 3.  SKIN: lateral lower extremity wounds as description by nursing staff  LABORATORY PANEL:  CBC Recent Labs  Lab 11/13/18 1626  WBC 8.8  HGB 10.0*  HCT 33.7*  PLT 157    Chemistries  Recent Labs  Lab 11/13/18 1626  11/16/18 0536  NA 140   < > 140  K 4.1   < > 4.1  CL 99   < > 98  CO2 32   < > 33*  GLUCOSE 153*   < > 212*  BUN 27*   < > 42*  CREATININE 1.28*   < > 1.24*  CALCIUM 8.8*   < > 8.7*  AST 35  --   --   ALT 22  --   --  ALKPHOS 91  --   --   BILITOT 1.0  --   --    < > = values in this interval not displayed.   Cardiac Enzymes Recent Labs  Lab 11/14/18 1204  TROPONINI 0.04*   RADIOLOGY:  Dg Ankle Complete Left  Result Date: 11/15/2018 CLINICAL DATA:  Fall EXAM: LEFT ANKLE COMPLETE - 3+ VIEW COMPARISON:  None. FINDINGS: Osteopenia. No fracture or dislocation of the left ankle. Minimal ankle mortise arthrosis. Vascular calcinosis about the foot and ankle. IMPRESSION: Osteopenia. No fracture or dislocation of the left ankle. Minimal ankle mortise  arthrosis. Vascular calcinosis about the foot and ankle. Electronically Signed   By: Eddie Candle M.D.   On: 11/15/2018 18:31   ASSESSMENT AND PLAN:  [name]  is a 85 y.o. female with a known history of CHF, diabetes mellitus, COPD, peripheral vascular disease.  Patient was sent to the emergency room from assisted living home for decreased oxygen saturation  1.  Acute on chronic systolic CHF- most recent ejection fraction 40% - Will change Lasix to IV 80 mg daily, creat improved. Change to po lasix now - Coreg continued - Echocardiogram  Showed The left ventricle has mild-moderately reduced systolic function, with an ejection fraction of 40-45%. The cavity size was normal. There is moderately increased left ventricular wall thickness. Left ventricular diastolic parameters were normal.  2. The right ventricle has moderately reduced systolic function. - Continue serial troponin levels 0.05--0.05--0.04 - Oxygen per nasal cannula down to 2L -DuoNeb every 6 hours as needed for shortness of breath -UOP 1300 cc  2. fall - No evidence of acute intracranial abnormality or spinal fracture. - Left hip x-ray is neg for fracture - Physical therapy consultation for generalized weakness and impaired mobility--recommends Rehab.  - left ankle xray--neg for fracture  3.  Atrial fibrillation - With history of pacemaker placement - Xarelto continued  4.  Diabetes mellitus --Moderate sliding scale insulin - Metformin and Lantus insulin have been continued  5. Overall deconditioning with bilateral lower extremity edema and leg wounds. -Wound consult. Patient follows at wound clinic.  I spoke with patient's daughter at length. Updated. Pt is agreeable to go t rehab. She has deconditioned a lot and requiring 2 person assist with falls.  CODE STATUS: dnr  DVT Prophylaxis: xarelto  TOTAL TIME TAKING CARE OF THIS PATIENT: *30* minutes.  >50% time spent on counselling and coordination of care   POSSIBLE D/C IN  1-2 DAYS, DEPENDING ON CLINICAL CONDITION.  Note: This dictation was prepared with Dragon dictation along with smaller phrase technology. Any transcriptional errors that result from this process are unintentional.  [name] M.D on 11/16/2018 at 9:57 AM  Between 7am to 6pm - Pager - [phone]  After 6pm go to [email or web address] - password [email or web address]  Office  [phone]  CC: Primary care physician; Tracie Harrier, MDPatient ID: [name], female   DOB: 10/05/1924, 85 y.o.   MRN: [id number]

## 2018-11-16 NOTE — Progress Notes (Addendum)
Pt states her LBM was on 11/13/2018. Pt have a scheduled colace but states want to try other meds. Notify prime and talked to [facility] NP and ordered Milk of Magnesia oral 30 ml PRN daily. Will continue to monitor.  Update 2200: Pt dressing on right lower leg was bleeding on assessment.On assessment one of the scab was bleeding. Old dressing was removed and placed a new one following the order from wound consult nurse. Will notify incoming shift. Will continue to monitor.

## 2018-11-16 NOTE — Progress Notes (Signed)
Attempted to call patient's daughter r/t mandatory daily update request, no answer on home phone. Will continue to monitor. [name] [facility]

## 2018-11-17 LAB — GLUCOSE, CAPILLARY
Glucose-Capillary: 235 mg/dL — ABNORMAL HIGH (ref 70–99)
Glucose-Capillary: 201 mg/dL — ABNORMAL HIGH (ref 70–99)

## 2018-11-17 LAB — BASIC METABOLIC PANEL
Anion gap: 7 (ref 5–15)
BUN: 33 mg/dL — ABNORMAL HIGH (ref 8–23)
CO2: 36 mmol/L — ABNORMAL HIGH (ref 22–32)
Calcium: 8.5 mg/dL — ABNORMAL LOW (ref 8.9–10.3)
Chloride: 98 mmol/L (ref 98–111)
Creatinine, Ser: 0.96 mg/dL (ref 0.44–1.00)
GFR calc Af Amer: 60 mL/min — ABNORMAL LOW (ref >60)
GFR calc non Af Amer: 52 mL/min — ABNORMAL LOW (ref >60)
Glucose, Bld: 138 mg/dL — ABNORMAL HIGH (ref 70–99)
Potassium: 4.2 mmol/L (ref 3.5–5.1)
Sodium: 141 mmol/L (ref 135–145)

## 2018-11-17 MED ORDER — ENSURE MAX PROTEIN PO LIQD: 11.0000 [oz_av] | Freq: Two times a day (BID) | ORAL | 0 refills | Status: AC

## 2018-11-17 MED ORDER — GABAPENTIN 100 MG PO CAPS: 100.0000 mg | ORAL_CAPSULE | Freq: Every day | ORAL | 0 refills | Status: AC

## 2018-11-17 MED ORDER — VITAMIN D (ERGOCALCIFEROL) 1.25 MG (50000 UNIT) PO CAPS: 50000.0000 [IU] | ORAL_CAPSULE | ORAL | 0 refills | Status: AC

## 2018-11-17 MED ORDER — COLLAGENASE 250 UNIT/GM EX OINT: TOPICAL_OINTMENT | Freq: Every day | CUTANEOUS | 0 refills | Status: AC

## 2018-11-17 MED ORDER — FUROSEMIDE 40 MG PO TABS
80.0000 mg | ORAL_TABLET | Freq: Two times a day (BID) | ORAL | Status: DC
  Administered 2018-11-17 (×2): 80 mg via ORAL
  Filled 2018-11-17 (×2): qty 2

## 2018-11-17 MED ORDER — FUROSEMIDE 40 MG PO TABS: 80.0000 mg | ORAL_TABLET | Freq: Two times a day (BID) | ORAL | Status: DC

## 2018-11-17 NOTE — Progress Notes (Signed)
Discharge report called to [facility]- verbalized an understanding/ iv and tele removed/ EMS called to transport.

## 2018-11-17 NOTE — NC FL2 (Addendum)
[place] LEVEL OF CARE SCREENING TOOL     IDENTIFICATION  Patient [name] Birthdate: 1924/12/01 Sex: female Admission Date (Current Location): 11/13/2018  [place] and [place] Number:  Engineering geologist and Address:  [facility], [address], [place], [place] [address]      Provider Number: [id number]  Attending Physician Name and Address:  [name], MD  Relative Name and Phone Number:  [name],Victoria Daughter [phone]  [phone]     Current Level of Care: Hospital Recommended Level of Care: [place] Prior Approval Number:    Date Approved/Denied:   PASRR Number: [id number]  Discharge Plan: Domiciliary (Rest home)([facility])    Current Diagnoses: Patient Active Problem List   Diagnosis Date Noted  . Acute on chronic diastolic CHF (congestive heart failure) ([place]) 11/13/2018  . Dyspnea 07/23/2018  . Weakness generalized 07/23/2018  . Goals of care, counseling/discussion   . Palliative care encounter   . Influenza A 07/05/2018  . CHF exacerbation ([place]) 06/14/2018  . Cellulitis of left lower extremity 06/02/2018  . ARF (acute renal failure) ([place]) 05/17/2018  . Pneumonia 12/11/2017  . CAP (community acquired pneumonia) 04/14/2017  . COPD (chronic obstructive pulmonary disease) ([place]) 12/07/2016  . UTI (urinary tract infection) 12/07/2016  . Ventral hernia without obstruction or gangrene   . Hyponatremia 09/07/2015  . Acute respiratory failure ([place]) 09/01/2015  . Atrial fibrillation ([place]) 04/10/2015  . Essential hypertension 04/10/2015  . Diabetes ([place]) 04/10/2015  . Acute on chronic combined systolic and diastolic CHF (congestive heart failure) ([place]) 03/21/2015  . Pulmonary nodule 11/10/2014    Orientation RESPIRATION BLADDER Height & Weight     Self, Time, Situation, Place  O2(2L) Incontinent Weight: 186 lb 4.6 oz (84.5 kg) Height:  5\' 7"  (170.2 cm)  BEHAVIORAL  SYMPTOMS/MOOD NEUROLOGICAL BOWEL NUTRITION STATUS      Continent Diet(Cardiac)  AMBULATORY STATUS COMMUNICATION OF NEEDS Skin   Limited Assist Verbally Surgical wounds, Skin abrasions                       Personal Care Assistance Level of Assistance  Bathing, Feeding, Dressing Bathing Assistance: Limited assistance Feeding assistance: Independent Dressing Assistance: Limited assistance     Functional Limitations Info  Sight, Hearing, Speech Sight Info: Adequate Hearing Info: Adequate Speech Info: Adequate    SPECIAL CARE FACTORS FREQUENCY  PT (By licensed PT)     PT Frequency: Minimum 2x a week OT Frequency: Minimum 5x a week            Contractures Contractures Info: Not present    Additional Factors Info  Code Status, Allergies, Psychotropic Code Status Info: DNR Allergies Info: CEFTRIAXONE, CEFUROXIME, CEPHALOSPORINS, CODEINE, EPINEPHRINE, MORPHINE AND RELATED, BUPRENORPHINE HCL, CIPROFLOXACIN, LATEX, LIDOCAINE, PROCAINE Psychotropic Info: buPROPion (WELLBUTRIN XL) 24 hr tablet 300 mg and busPIRone (BUSPAR) tablet 5 mg and escitalopram (LEXAPRO) tablet 10 mg          Current Medications (11/17/2018):  This is the current hospital active medication list Current Facility-Administered Medications  Medication Dose Route Frequency Provider Last Rate Last Dose  . 0.9 %  sodium chloride infusion  250 mL Intravenous PRN Seals, Levada Dy H, NP      . acetaminophen (TYLENOL) tablet 650 mg  650 mg Oral Q4H PRN Gardiner Barefoot H, NP   650 mg at 11/14/18 0852  . aspirin EC tablet 81 mg  81 mg Oral Daily Seals, Theo Dills, NP   [id number]  mg at 11/17/18 1043  . buPROPion (WELLBUTRIN XL) 24 hr tablet 300 mg  300 mg Oral Daily Seals, Angela H, NP   300 mg at 11/17/18 1042  . busPIRone (BUSPAR) tablet 5 mg  5 mg Oral BID Gardiner Barefoot H, NP   5 mg at 11/17/18 1043  . carvedilol (COREG) tablet 6.25 mg  6.25 mg Oral BID WC Seals, Angela H, NP   6.25 mg at 11/17/18 1042  . collagenase (SANTYL)  ointment   Topical Daily [name], MD      . docusate sodium (COLACE) capsule 100 mg  100 mg Oral BID Gardiner Barefoot H, NP   100 mg at 11/17/18 1042  . escitalopram (LEXAPRO) tablet 10 mg  10 mg Oral Daily Seals, Angela H, NP   10 mg at 11/17/18 1043  . ferrous sulfate tablet 325 mg  325 mg Oral Q breakfast Seals, Levada Dy H, NP   325 mg at 11/17/18 1043  . furosemide (LASIX) tablet 80 mg  80 mg Oral BID [name], MD   80 mg at 11/17/18 1319  . gabapentin (NEURONTIN) capsule 100 mg  100 mg Oral QHS Seals, Angela H, NP   100 mg at 11/16/18 2200  . insulin aspart (novoLOG) injection 0-15 Units  0-15 Units Subcutaneous TID WC Seals, Theo Dills, NP   8 Units at 11/16/18 1644  . insulin aspart (novoLOG) injection 0-5 Units  0-5 Units Subcutaneous QHS Seals, Angela H, NP      . insulin glargine (LANTUS) injection 10 Units  10 Units Subcutaneous QHS Mayer Camel, NP   10 Units at 11/16/18 2201  . ipratropium-albuterol (DUONEB) 0.5-2.5 (3) MG/3ML nebulizer solution 3 mL  3 mL Nebulization Q6H PRN Seals, Theo Dills, NP      . loratadine (CLARITIN) tablet 10 mg  10 mg Oral Daily Seals, Angela H, NP   10 mg at 11/17/18 1043  . magnesium hydroxide (MILK OF MAGNESIA) suspension 30 mL  30 mL Oral Daily PRN Seals, Theo Dills, NP      . Melatonin TABS 5 mg  5 mg Oral QHS Seals, Angela H, NP   5 mg at 11/16/18 2200  . multivitamin with minerals tablet 1 tablet  1 tablet Oral Daily [name], MD   1 tablet at 11/17/18 1042  . ondansetron (ZOFRAN) injection 4 mg  4 mg Intravenous Q6H PRN Seals, Theo Dills, NP   4 mg at 11/16/18 1938  . pantoprazole (PROTONIX) EC tablet 40 mg  40 mg Oral Daily Seals, Angela H, NP   40 mg at 11/17/18 1042  . protein supplement (ENSURE MAX) liquid  11 oz Oral BID BM [name], MD   11 oz at 11/16/18 1328  . Rivaroxaban (XARELTO) tablet 15 mg  15 mg Oral Q supper Gardiner Barefoot H, NP   15 mg at 11/16/18 1635  . rOPINIRole (REQUIP) tablet 0.5 mg  0.5 mg Oral QHS Seals, Angela H, NP   0.5  mg at 11/16/18 2200  . senna (SENOKOT) tablet 8.6 mg  1 tablet Oral Daily Seals, Angela H, NP   8.6 mg at 11/17/18 1042  . sodium chloride flush (NS) 0.9 % injection 3 mL  3 mL Intravenous Q12H Seals, Angela H, NP   3 mL at 11/17/18 1043  . sodium chloride flush (NS) 0.9 % injection 3 mL  3 mL Intravenous PRN Seals, Levada Dy H, NP      . traMADol (ULTRAM) tablet 50 mg  50 mg  Oral Q6H PRN [name], MD   50 mg at 11/15/18 2257  . traZODone (DESYREL) tablet 50 mg  50 mg Oral QHS Seals, Angela H, NP   50 mg at 11/16/18 2200  . vitamin B-12 (CYANOCOBALAMIN) tablet 1,000 mcg  1,000 mcg Oral Daily Seals, Levada Dy H, NP   1,000 mcg at 11/17/18 1042  . vitamin C (ASCORBIC ACID) tablet 500 mg  500 mg Oral Daily Seals, Angela H, NP   500 mg at 11/17/18 1042  . [START ON 11/26/2018] Vitamin D (Ergocalciferol) (DRISDOL) capsule 50,000 Units  50,000 Units Oral Q Thu Seals, Theo Dills, NP         Discharge Medications: STOP taking these medications   doxycycline 100 MG tablet Commonly known as:  VIBRA-TABS   Pedialyte Soln   spironolactone 25 MG tablet Commonly known as:  ALDACTONE     TAKE these medications   acetaminophen 650 MG CR tablet Commonly known as:  TYLENOL Take 650 mg by mouth every 8 (eight) hours. What changed:  Another medication with the same name was removed. Continue taking this medication, and follow the directions you see here.   aspirin EC 81 MG tablet Take 81 mg by mouth daily.   baclofen 10 MG tablet Commonly known as:  LIORESAL Take 5 mg by mouth 3 (three) times daily as needed for muscle spasms.   buPROPion 300 MG 24 hr tablet Commonly known as:  WELLBUTRIN XL Take 300 mg by mouth daily.   busPIRone 5 MG tablet Commonly known as:  BUSPAR Take 5 mg by mouth 2 (two) times daily.   carvedilol 6.25 MG tablet Commonly known as:  COREG Take 6.25 mg by mouth 2 (two) times daily with a meal.   cholecalciferol 1000 units tablet Commonly known as:  VITAMIN  D Take 2,000 Units by mouth daily.   collagenase ointment Commonly known as:  SANTYL Apply topically daily.   docusate sodium 100 MG capsule Commonly known as:  COLACE Take 100 mg by mouth 2 (two) times daily.   Ensure Max Protein Liqd Take 330 mLs (11 oz total) by mouth 2 (two) times daily between meals.   escitalopram 10 MG tablet Commonly known as:  LEXAPRO Take 10 mg by mouth daily.   ferrous sulfate 325 (65 FE) MG tablet Take 325 mg by mouth daily with breakfast.   fexofenadine 60 MG tablet Commonly known as:  ALLEGRA Take 60 mg by mouth 2 (two) times daily.   furosemide 80 MG tablet Commonly known as:  LASIX Take 80 mg by mouth 2 (two) times daily. What changed:  Another medication with the same name was removed. Continue taking this medication, and follow the directions you see here.   gabapentin 100 MG capsule Commonly known as:  NEURONTIN Take 1 capsule (100 mg total) by mouth at bedtime. What changed:    how much to take  additional instructions   insulin glargine 100 UNIT/ML injection Commonly known as:  LANTUS Inject 0.2 mLs (20 Units total) into the skin at bedtime. What changed:  how much to take   ipratropium-albuterol 0.5-2.5 (3) MG/3ML Soln Commonly known as:  DUONEB Take 3 mLs by nebulization every 6 (six) hours as needed (wheezing). What changed:  reasons to take this   loperamide 2 MG capsule Commonly known as:  IMODIUM Take 2-4 mg by mouth See admin instructions. Take 2 capsules (4mg ) by mouth after initial loose stool and take 1 capsule (2mg ) by mouth after second loose stool  magnesium oxide 400 MG tablet Commonly known as:  MAG-OX Take 400 mg by mouth daily.   Melatonin 3 MG Tabs Take 3 mg by mouth at bedtime.   metFORMIN 750 MG 24 hr tablet Commonly known as:  GLUCOPHAGE-XR Take 750 mg by mouth daily.   oxyCODONE 5 MG immediate release tablet Commonly known as:  Oxy IR/ROXICODONE Take 1 tablet (5 mg total) by  mouth every 6 (six) hours as needed for moderate pain or severe pain. What changed:  when to take this   pantoprazole 40 MG tablet Commonly known as:  PROTONIX Take 40 mg by mouth daily.   polyethylene glycol 17 g packet Commonly known as:  MIRALAX / GLYCOLAX Take 17 g by mouth daily as needed for mild constipation or moderate constipation.   ProAir HFA 108 (90 Base) MCG/ACT inhaler Generic drug:  albuterol Inhale 2 puffs into the lungs every 6 (six) hours as needed for wheezing or shortness of breath.   Rivaroxaban 15 MG Tabs tablet Commonly known as:  XARELTO Take 15 mg by mouth daily.   rOPINIRole 0.5 MG tablet Commonly known as:  REQUIP Take 0.5 mg by mouth at bedtime.   senna 8.6 MG Tabs tablet Commonly known as:  SENOKOT Take 1 tablet (8.6 mg total) by mouth daily. What changed:  when to take this   traZODone 50 MG tablet Commonly known as:  DESYREL Take 50 mg by mouth at bedtime.   vitamin B-12 1000 MCG tablet Commonly known as:  CYANOCOBALAMIN Take 1,000 mcg by mouth daily.   vitamin C 500 MG tablet Commonly known as:  ASCORBIC ACID Take 500 mg by mouth daily.   Vitamin D (Ergocalciferol) 1.25 MG (50000 UT) Caps capsule Commonly known as:  DRISDOL Take 50,000 Units by mouth every Thursday. What changed:  Another medication with the same name was added. Make sure you understand how and when to take each.   Vitamin D (Ergocalciferol) 1.25 MG (50000 UT) Caps capsule Commonly known as:  DRISDOL Take 1 capsule (50,000 Units total) by mouth every Thursday. Start taking on:  November 26, 2018 What changed:  You were already taking a medication with the same name, and this prescription was added. Make sure you understand how and when to take each.     Relevant Imaging Results:  Relevant Lab Results:   Additional Information SSN [id number]  [name], LCSW

## 2018-11-17 NOTE — Discharge Summary (Addendum)
[facility] [name]    MR#:  [id number]  DATE OF BIRTH:  July 22, 1924  DATE OF ADMISSION:  11/13/2018 ADMITTING PHYSICIAN: Sela Hua, MD  DATE OF DISCHARGE: 11/17/2018  PRIMARY CARE PHYSICIAN: Tracie Harrier, MD    ADMISSION DIAGNOSIS:  SOB (shortness of breath) [R06.02] Fall [W19.XXXA] Hypoxia [R09.02] AKI (acute kidney injury) ([place]) [N17.9] Congestive heart failure, unspecified HF chronicity, unspecified heart failure type ([place]) [I50.9]  DISCHARGE DIAGNOSIS:  acute on chronic systolic CHF atrial fibrillation chronic on Xarelto generalized weakness deconditioning bilateral lower extremity chronic edema with chronic leg wounds--followed at wound clinic  SECONDARY DIAGNOSIS:   Past Medical History:  Diagnosis Date  . Cancer ([place])    skin ca  . CHF (congestive heart failure) ([place])   . Chronic back pain   . COPD (chronic obstructive pulmonary disease) ([place])   . Diabetes mellitus without complication ([place])   . Hypertension   . Pacemaker    2015  . Poor perfusion of leg     HOSPITAL COURSE:   [place] a89 y.o.femalewith a known history of CHF, diabetes mellitus, COPD, peripheral vascular disease. Patient was sent to the emergency room from assisted living home for decreased oxygen saturation  1.Acute on chronic systolic CHF- most recent ejection fraction 40% - Will change Lasix to IV 80 mg daily, creat improved. Change to po lasix now bid (was started on this dose by dr Nehemiah Massed on may 13th,2020--clinc note) -will defer to cardiology to adjust her Lasix dose as outpatient. - Coreg continued - Echocardiogram  Showed The left ventricle has mild-moderately reduced systolic function, with an ejection fraction of 40-45%. The cavity size was normal. There is moderately increased left ventricular wall thickness. Left ventricular diastolic parameters were normal. 2. The right ventricle has  moderately reduced systolic function. - Continue serial troponin levels 0.05--0.05--0.04 - Oxygen per nasal cannula down to 2L -DuoNeb every 6 hours as needed for shortness of breath -overall improving appears best at baseline. -Creatinine 0.96  2. fall - No evidence of acute intracranial abnormality or spinal fracture. - Left hip x-ray is neg for fracture - Physical therapy consultation for generalized weakness and impaired mobility--recommends Rehab.  - left ankle xray--neg for fracture  3. Atrial fibrillation - With history of pacemaker placement - Xarelto continued  4.Diabetes mellitus --Moderate sliding scale insulin - Metformin and Lantus insulin have been continued  5. Overall deconditioning with bilateral lower extremity edema and leg wounds. - Patient follows at wound clinic. -Continue santyl oint per Wound nurse rec  Pt's dter spoke with CSW that they want to have pt to go back to [facility] with hospice services. She has deconditioned a lot and requiring 2 person assist with falls.  Will get hospice follow pt at ALF CODE STATUS: dnr CONSULTS OBTAINED:    DRUG ALLERGIES:   Allergies  Allergen Reactions  . Ceftriaxone Swelling    Tolerates piperacillin/tazobactam, PCN G  . Cefuroxime Hives    Tolerates piperacillin/tazobactam, PCN G  . Cephalosporins Swelling and Other (See Comments)    Reaction:  Fainting/dry mouth  Tolerates piperacillin/tazobactam, PCN G  . Codeine Nausea And Vomiting  . Epinephrine Other (See Comments)    Reaction:  Fainting   . Morphine And Related Other (See Comments)    Reaction:  GI upset   . Buprenorphine Hcl Nausea And Vomiting  . Ciprofloxacin Itching, Swelling and Rash  . Latex Rash  . Lidocaine Rash  .  Procaine Rash    DISCHARGE MEDICATIONS:   Allergies as of 11/17/2018      Reactions   Ceftriaxone Swelling   Tolerates piperacillin/tazobactam, PCN G   Cefuroxime Hives   Tolerates piperacillin/tazobactam,  PCN G   Cephalosporins Swelling, Other (See Comments)   Reaction:  Fainting/dry mouth  Tolerates piperacillin/tazobactam, PCN G   Codeine Nausea And Vomiting   Epinephrine Other (See Comments)   Reaction:  Fainting    Morphine And Related Other (See Comments)   Reaction:  GI upset    Buprenorphine Hcl Nausea And Vomiting   Ciprofloxacin Itching, Swelling, Rash   Latex Rash   Lidocaine Rash   Procaine Rash      Medication List    STOP taking these medications   doxycycline 100 MG tablet Commonly known as:  VIBRA-TABS   Pedialyte Soln   spironolactone 25 MG tablet Commonly known as:  ALDACTONE     TAKE these medications   acetaminophen 650 MG CR tablet Commonly known as:  TYLENOL Take 650 mg by mouth every 8 (eight) hours. What changed:  Another medication with the same name was removed. Continue taking this medication, and follow the directions you see here.   aspirin EC 81 MG tablet Take 81 mg by mouth daily.   baclofen 10 MG tablet Commonly known as:  LIORESAL Take 5 mg by mouth 3 (three) times daily as needed for muscle spasms.   buPROPion 300 MG 24 hr tablet Commonly known as:  WELLBUTRIN XL Take 300 mg by mouth daily.   busPIRone 5 MG tablet Commonly known as:  BUSPAR Take 5 mg by mouth 2 (two) times daily.   carvedilol 6.25 MG tablet Commonly known as:  COREG Take 6.25 mg by mouth 2 (two) times daily with a meal.   cholecalciferol 1000 units tablet Commonly known as:  VITAMIN D Take 2,000 Units by mouth daily.   collagenase ointment Commonly known as:  SANTYL Apply topically daily.   docusate sodium 100 MG capsule Commonly known as:  COLACE Take 100 mg by mouth 2 (two) times daily.   Ensure Max Protein Liqd Take 330 mLs (11 oz total) by mouth 2 (two) times daily between meals.   escitalopram 10 MG tablet Commonly known as:  LEXAPRO Take 10 mg by mouth daily.   ferrous sulfate 325 (65 FE) MG tablet Take 325 mg by mouth daily with breakfast.    fexofenadine 60 MG tablet Commonly known as:  ALLEGRA Take 60 mg by mouth 2 (two) times daily.   furosemide 80 MG tablet Commonly known as:  LASIX Take 80 mg by mouth 2 (two) times daily. What changed:  Another medication with the same name was removed. Continue taking this medication, and follow the directions you see here.   gabapentin 100 MG capsule Commonly known as:  NEURONTIN Take 1 capsule (100 mg total) by mouth at bedtime. What changed:    how much to take  additional instructions   insulin glargine 100 UNIT/ML injection Commonly known as:  LANTUS Inject 0.2 mLs (20 Units total) into the skin at bedtime. What changed:  how much to take   ipratropium-albuterol 0.5-2.5 (3) MG/3ML Soln Commonly known as:  DUONEB Take 3 mLs by nebulization every 6 (six) hours as needed (wheezing). What changed:  reasons to take this   loperamide 2 MG capsule Commonly known as:  IMODIUM Take 2-4 mg by mouth See admin instructions. Take 2 capsules (4mg ) by mouth after initial loose stool and take  1 capsule (2mg ) by mouth after second loose stool   magnesium oxide 400 MG tablet Commonly known as:  MAG-OX Take 400 mg by mouth daily.   Melatonin 3 MG Tabs Take 3 mg by mouth at bedtime.   metFORMIN 750 MG 24 hr tablet Commonly known as:  GLUCOPHAGE-XR Take 750 mg by mouth daily.   oxyCODONE 5 MG immediate release tablet Commonly known as:  Oxy IR/ROXICODONE Take 1 tablet (5 mg total) by mouth every 6 (six) hours as needed for moderate pain or severe pain. What changed:  when to take this   pantoprazole 40 MG tablet Commonly known as:  PROTONIX Take 40 mg by mouth daily.   polyethylene glycol 17 g packet Commonly known as:  MIRALAX / GLYCOLAX Take 17 g by mouth daily as needed for mild constipation or moderate constipation.   ProAir HFA 108 (90 Base) MCG/ACT inhaler Generic drug:  albuterol Inhale 2 puffs into the lungs every 6 (six) hours as needed for wheezing or  shortness of breath.   Rivaroxaban 15 MG Tabs tablet Commonly known as:  XARELTO Take 15 mg by mouth daily.   rOPINIRole 0.5 MG tablet Commonly known as:  REQUIP Take 0.5 mg by mouth at bedtime.   senna 8.6 MG Tabs tablet Commonly known as:  SENOKOT Take 1 tablet (8.6 mg total) by mouth daily. What changed:  when to take this   traZODone 50 MG tablet Commonly known as:  DESYREL Take 50 mg by mouth at bedtime.   vitamin B-12 1000 MCG tablet Commonly known as:  CYANOCOBALAMIN Take 1,000 mcg by mouth daily.   vitamin C 500 MG tablet Commonly known as:  ASCORBIC ACID Take 500 mg by mouth daily.   Vitamin D (Ergocalciferol) 1.25 MG (50000 UT) Caps capsule Commonly known as:  DRISDOL Take 50,000 Units by mouth every Thursday. What changed:  Another medication with the same name was added. Make sure you understand how and when to take each.   Vitamin D (Ergocalciferol) 1.25 MG (50000 UT) Caps capsule Commonly known as:  DRISDOL Take 1 capsule (50,000 Units total) by mouth every Thursday. Start taking on:  November 26, 2018 What changed:  You were already taking a medication with the same name, and this prescription was added. Make sure you understand how and when to take each.       If you experience worsening of your admission symptoms, develop shortness of breath, life threatening emergency, suicidal or homicidal thoughts you must seek medical attention immediately by calling 911 or calling your MD immediately  if symptoms less severe.  You Must read complete instructions/literature along with all the possible adverse reactions/side effects for all the Medicines you take and that have been prescribed to you. Take any new Medicines after you have completely understood and accept all the possible adverse reactions/side effects.   Please note  You were cared for by a hospitalist during your hospital stay. If you have any questions about your discharge medications or the care you  received while you were in the hospital after you are discharged, you can call the unit and asked to speak with the hospitalist on call if the hospitalist that took care of you is not available. Once you are discharged, your primary care physician will handle any further medical issues. Please note that NO REFILLS for any discharge medications will be authorized once you are discharged, as it is imperative that you return to your primary care physician (or establish a  relationship with a primary care physician if you do not have one) for your aftercare needs so that they can reassess your need for medications and monitor your lab values. Today   SUBJECTIVE  no new complaints. She is weak.   VITAL SIGNS:  Blood pressure (!) 132/54, pulse 68, temperature 97.8 F (36.6 C), temperature source Oral, resp. rate 18, height 5\' 7"  (1.702 m), weight 84.5 kg, SpO2 99 %.  I/O:    Intake/Output Summary (Last 24 hours) at 11/17/2018 1227 Last data filed at 11/17/2018 0133 Gross per 24 hour  Intake 600 ml  Output 200 ml  Net 400 ml    PHYSICAL EXAMINATION:   Physical Exam  GENERAL:  85 y.o.-year-old patient lying in the bed with no acute distress. Appears weak EYES: Pupils equal, round, reactive to light and accommodation. No scleral icterus. Extraocular muscles intact.  HEENT: Head atraumatic, normocephalic. Oropharynx and nasopharynx clear.  NECK:  Supple, no jugular venous distention. No thyroid enlargement, no tenderness.  LUNGS: decreased breath sounds bilaterally, no wheezing, rales, rhonchi. No use of accessory muscles of respiration.  CARDIOVASCULAR: S1, S2 normal. No murmurs, rubs, or gallops.  ABDOMEN: Soft, nontender, nondistended. Bowel sounds present. No organomegaly or mass.  EXTREMITIES:     CNS cranial nerves intact. No gross motor deficit. Generalized weakness.  Skin as above. Psych patient is alert oriented times three DATA REVIEW:   CBC  Recent Labs  Lab 11/13/18 1626   WBC 8.8  HGB 10.0*  HCT 33.7*  PLT 157    Chemistries  Recent Labs  Lab 11/13/18 1626  11/17/18 0429  NA 140   < > 141  K 4.1   < > 4.2  CL 99   < > 98  CO2 32   < > 36*  GLUCOSE 153*   < > 138*  BUN 27*   < > 33*  CREATININE 1.28*   < > 0.96  CALCIUM 8.8*   < > 8.5*  AST 35  --   --   ALT 22  --   --   ALKPHOS 91  --   --   BILITOT 1.0  --   --    < > = values in this interval not displayed.    Microbiology Results   Recent Results (from the past 240 hour(s))  SARS Coronavirus 2 (CEPHEID - Performed in [facility] hospital lab), Hosp Order     Status: None   Collection Time: 11/13/18  6:57 PM  Result Value Ref Range Status   SARS Coronavirus 2 NEGATIVE NEGATIVE Final    Comment: (NOTE) If result is NEGATIVE SARS-CoV-2 target nucleic acids are NOT DETECTED. The SARS-CoV-2 RNA is generally detectable in upper and lower  respiratory specimens during the acute phase of infection. The lowest  concentration of SARS-CoV-2 viral copies this assay can detect is 250  copies / mL. A negative result does not preclude SARS-CoV-2 infection  and should not be used as the sole basis for treatment or other  patient management decisions.  A negative result may occur with  improper specimen collection / handling, submission of specimen other  than nasopharyngeal swab, presence of viral mutation(s) within the  areas targeted by this assay, and inadequate number of viral copies  (<250 copies / mL). A negative result must be combined with clinical  observations, patient history, and epidemiological information. If result is POSITIVE SARS-CoV-2 target nucleic acids are DETECTED. The SARS-CoV-2 RNA is generally detectable in upper and  lower  respiratory specimens dur ing the acute phase of infection.  Positive  results are indicative of active infection with SARS-CoV-2.  Clinical  correlation with patient history and other diagnostic information is  necessary to determine patient  infection status.  Positive results do  not rule out bacterial infection or co-infection with other viruses. If result is PRESUMPTIVE POSTIVE SARS-CoV-2 nucleic acids MAY BE PRESENT.   A presumptive positive result was obtained on the submitted specimen  and confirmed on repeat testing.  While 2019 novel coronavirus  (SARS-CoV-2) nucleic acids may be present in the submitted sample  additional confirmatory testing may be necessary for epidemiological  and / or clinical management purposes  to differentiate between  SARS-CoV-2 and other Sarbecovirus currently known to infect humans.  If clinically indicated additional testing with an alternate test  methodology [phone]) is advised. The SARS-CoV-2 RNA is generally  detectable in upper and lower respiratory sp ecimens during the acute  phase of infection. The expected result is Negative. Fact Sheet for Patients:  StrictlyIdeas.no Fact Sheet for Healthcare Providers: BankingDealers.co.za This test is not yet approved or cleared by the [place] FDA and has been authorized for detection and/or diagnosis of SARS-CoV-2 by FDA under an Emergency Use Authorization (EUA).  This EUA will remain in effect (meaning this test can be used) for the duration of the COVID-19 declaration under Section 564(b)(1) of the Act, 21 U.S.C. section 360bbb-3(b)(1), unless the authorization is terminated or revoked sooner. Performed at [facility], [address] [place], [place] [address]   MRSA PCR Screening     Status: None   Collection Time: 11/14/18  1:10 AM  Result Value Ref Range Status   MRSA by PCR NEGATIVE NEGATIVE Final    Comment:        The GeneXpert MRSA Assay (FDA approved for NASAL specimens only), is one component of a comprehensive MRSA colonization surveillance program. It is not intended to diagnose MRSA infection nor to guide or monitor treatment for MRSA infections.  Performed at [facility], [address] [place], [place] [address]     RADIOLOGY:  Dg Ankle Complete Left  Result Date: 11/15/2018 CLINICAL DATA:  Fall EXAM: LEFT ANKLE COMPLETE - 3+ VIEW COMPARISON:  None. FINDINGS: Osteopenia. No fracture or dislocation of the left ankle. Minimal ankle mortise arthrosis. Vascular calcinosis about the foot and ankle. IMPRESSION: Osteopenia. No fracture or dislocation of the left ankle. Minimal ankle mortise arthrosis. Vascular calcinosis about the foot and ankle. Electronically Signed   By: Eddie Candle M.D.   On: 11/15/2018 18:31     CODE STATUS:     Code Status Orders  (From admission, onward)         Start     Ordered   11/14/18 0045  Do not attempt resuscitation (DNR)  Continuous    Question Answer Comment  In the event of cardiac or respiratory ARREST Do not call a "code blue"   In the event of cardiac or respiratory ARREST Do not perform Intubation, CPR, defibrillation or ACLS   In the event of cardiac or respiratory ARREST Use medication by any route, position, wound care, and other measures to relive pain and suffering. May use oxygen, suction and manual treatment of airway obstruction as needed for comfort.      11/14/18 0045        Code Status History    Date Active Date Inactive Code Status Order ID Comments User Context   11/13/2018  2348 11/14/2018 0045 Full Code [id number]  Mayer Camel, NP ED   09/03/2018 1458 09/08/2018 0014 DNR [id number]  Dustin Flock, MD Inpatient   09/03/2018 0522 09/03/2018 1458 Full Code [id number]  Arta Silence, MD Inpatient   07/06/2018 1311 07/09/2018 2056 DNR [id number]  Demetrios Loll, MD Inpatient   07/05/2018 1337 07/06/2018 1311 Full Code [id number]  Gladstone Lighter, MD Inpatient   06/30/2018 0352 07/03/2018 1557 DNR [id number]  Salary, Avel Peace, MD ED   06/14/2018 1501 06/16/2018 1634 DNR [id number]  Epifanio Lesches, MD ED   06/02/2018 0138 06/03/2018 1933 DNR [id number]  Arta Silence, MD ED   05/17/2018 0118 05/18/2018 1826 DNR [id number]  Amelia Jo, MD Inpatient   12/11/2017 2059 Oct 29, 202019 2127 DNR [id number]  Hillary Bow, MD ED   07/15/2017 1830 07/19/2017 1845 DNR [id number]  Gorden Harms, MD Inpatient   04/14/2017 0344 04/18/2017 2138 Full Code [id number]  Lance Coon, MD Inpatient   12/08/2016 0107 12/09/2016 2031 Full Code [id number]  Lance Coon, MD ED   09/07/2015 0441 09/13/2015 1955 Full Code [id number]  Saundra Shelling, MD Inpatient   09/01/2015 1533 09/03/2015 1907 Full Code [id number]  Epifanio Lesches, MD ED   03/21/2015 0626 03/22/2015 1939 Full Code [id number]  Harrie Foreman, MD Inpatient    Advance Directive Documentation     Most Recent Value  Type of Advance Directive  Out of facility DNR (pink MOST or yellow form)  Pre-existing out of facility DNR order (yellow form or pink MOST form)  -  "MOST" Form in Place?  -      TOTAL TIME TAKING CARE OF THIS PATIENT: *40* minutes.    [name] M.D on 11/17/2018 at 12:27 PM  Between 7am to 6pm - Pager - [phone] After 6pm go to [email or web address] - password [facility]  Office  [phone]  CC: Primary care physician; Tracie Harrier, MD

## 2018-11-17 NOTE — TOC Transition Note (Signed)
Transition of Care [facility]) - CM/SW Discharge Note   Patient Details  [name] MRN: [id number] Date of Birth: [birth year]  [facility] CM/SW Contact:  [name], LCSW Phone Number: 11/17/2018, 6:09 PM   Clinical Narrative:     Patient was going to be going to [facility] [place], CSW was contacted by patient's daughter [name], and she decided she would like patient to return to [facility] with hospice services.  [facility] director [name] contacted CSW and they have agreed to accept patient back with hospice services to follow.  CSW spoke to attending physician and she agreed to order hospice services to follow patient at ALF.  CSW faxed discharge summary, FL2, hospice services order, wound care, and PT notes.  CSW contacted [facility] and informed them that patient is not coming to SNF now.  Patient to be d/c'ed today to [facility].  Patient and family agreeable to plans will transport via ems RN to call report to [phone].  Patient's daughter was informed that patient will be returning to ALF today.      Final next level of care: Assisted Living Barriers to Discharge: Barriers Resolved   Patient Goals and CMS Choice Patient states their goals for this hospitalization and ongoing recovery are:: to go back to [facility].gov Compare Post Acute Care list provided to:: Patient Represenative (must comment) Choice offered to / list presented to : Patient, Adult Children  Discharge Placement               Name of family member notified: [name] patient's daughter Patient and family notified of of transfer: 11/17/18  Discharge Plan and Services   Discharge Planning Services: CM Consult Post Acute Care Choice: Home Health, Resumption of Svcs/PTA Provider          DME Arranged: N/A DME Agency: NA       HH Arranged: NA [name] Agency: Other - See comment([facility]) Date HH Agency  Contacted: 11/17/18 Time HH Agency Contacted: 1400 Representative spoke with at [place]: [name] [place] ([place]) Interventions     Readmission Risk Interventions Readmission Risk Prevention Plan 11/14/2018 09/07/2018 09/04/2018  Transportation Screening Complete - Complete  Medication Review Press photographer) Complete - Complete  PCP or Specialist appointment within 3-5 days of discharge - Complete -  [place] or Home Care Consult Complete - Complete  SW Recovery Care/Counseling Consult - - Complete  Palliative Care Screening Complete - Complete  Skilled Nursing Facility Complete - Not Complete  SNF Comments - - PT recommended home health, patient is from [facility].  Some recent data might be hidden

## 2018-11-18 ENCOUNTER — Ambulatory Visit: Payer: Medicare Other | Admitting: Internal Medicine

## 2018-11-28 ENCOUNTER — Encounter: Payer: Self-pay | Admitting: Emergency Medicine

## 2018-11-28 ENCOUNTER — Emergency Department: Payer: Medicare Other

## 2018-11-28 ENCOUNTER — Other Ambulatory Visit: Payer: Self-pay

## 2018-11-28 ENCOUNTER — Emergency Department
Admission: EM | Admit: 2018-11-28 | Discharge: 2018-11-28 | Disposition: A | Discharge status: Home / Self Care | Payer: Medicare Other | Attending: Emergency Medicine | Admitting: Emergency Medicine

## 2018-11-28 DIAGNOSIS — W228XXA Striking against or struck by other objects, initial encounter: Secondary | ICD-10-CM | POA: Diagnosis not present

## 2018-11-28 DIAGNOSIS — Y9389 Activity, other specified: Secondary | ICD-10-CM | POA: Diagnosis not present

## 2018-11-28 DIAGNOSIS — Z91040 Latex allergy status: Secondary | ICD-10-CM | POA: Insufficient documentation

## 2018-11-28 DIAGNOSIS — Y92129 Unspecified place in nursing home as the place of occurrence of the external cause: Secondary | ICD-10-CM | POA: Diagnosis not present

## 2018-11-28 DIAGNOSIS — I509 Heart failure, unspecified: Secondary | ICD-10-CM | POA: Insufficient documentation

## 2018-11-28 DIAGNOSIS — Y999 Unspecified external cause status: Secondary | ICD-10-CM | POA: Diagnosis not present

## 2018-11-28 DIAGNOSIS — M25569 Pain in unspecified knee: Secondary | ICD-10-CM

## 2018-11-28 DIAGNOSIS — I110 Hypertensive heart disease with heart failure: Secondary | ICD-10-CM | POA: Insufficient documentation

## 2018-11-28 DIAGNOSIS — Z794 Long term (current) use of insulin: Secondary | ICD-10-CM | POA: Diagnosis not present

## 2018-11-28 DIAGNOSIS — M25561 Pain in right knee: Secondary | ICD-10-CM | POA: Insufficient documentation

## 2018-11-28 DIAGNOSIS — Z7901 Long term (current) use of anticoagulants: Secondary | ICD-10-CM | POA: Diagnosis not present

## 2018-11-28 DIAGNOSIS — Z7982 Long term (current) use of aspirin: Secondary | ICD-10-CM | POA: Insufficient documentation

## 2018-11-28 DIAGNOSIS — S8991XA Unspecified injury of right lower leg, initial encounter: Secondary | ICD-10-CM | POA: Diagnosis present

## 2018-11-28 DIAGNOSIS — E119 Type 2 diabetes mellitus without complications: Secondary | ICD-10-CM | POA: Diagnosis not present

## 2018-11-28 DIAGNOSIS — S81811A Laceration without foreign body, right lower leg, initial encounter: Secondary | ICD-10-CM | POA: Insufficient documentation

## 2018-11-28 MED ORDER — ACETAMINOPHEN 325 MG PO TABS
650.0000 mg | ORAL_TABLET | Freq: Once | ORAL | Status: AC
  Administered 2018-11-28: 650 mg via ORAL
  Filled 2018-11-28: qty 2

## 2018-11-28 MED ORDER — BACITRACIN ZINC 500 UNIT/GM EX OINT
TOPICAL_OINTMENT | Freq: Two times a day (BID) | CUTANEOUS | Status: DC
  Administered 2018-11-28: 1 via TOPICAL
  Filled 2018-11-28: qty 0.9

## 2018-11-28 NOTE — ED Notes (Signed)
Await transport

## 2018-11-28 NOTE — ED Provider Notes (Signed)
[facility] Emergency Department Provider Note  ____________________________________________   I have reviewed the triage vital signs and the nursing notes. Where available I have reviewed prior notes and, if possible and indicated, outside hospital notes.    HISTORY  Chief Complaint Knee Pain    HPI [name] is a 85 y.o. female who is in a facility, she was being transferred to her wheelchair, she did not fall but she banged her shin on the wheelchair according to patient.  She remembers clearly the event.  She did not hit her head.  She has a skin tear to the anterior pretibial region and had "little bit" of knee pain on that side.  No other injury, no hip pain, no question of injury.      Past Medical History:  Diagnosis Date  . Cancer ([place])    skin ca  . CHF (congestive heart failure) ([place])   . Chronic back pain   . COPD (chronic obstructive pulmonary disease) ([place])   . Diabetes mellitus without complication ([place])   . Hypertension   . Pacemaker    2015  . Poor perfusion of leg     Patient Active Problem List   Diagnosis Date Noted  . Acute on chronic diastolic CHF (congestive heart failure) ([place]) 11/13/2018  . Dyspnea 07/23/2018  . Weakness generalized 07/23/2018  . Goals of care, counseling/discussion   . Palliative care encounter   . Influenza A 07/05/2018  . CHF exacerbation ([place]) 06/14/2018  . Cellulitis of left lower extremity 06/02/2018  . ARF (acute renal failure) ([place]) 05/17/2018  . Pneumonia 12/11/2017  . CAP (community acquired pneumonia) 04/14/2017  . COPD (chronic obstructive pulmonary disease) ([place]) 12/07/2016  . UTI (urinary tract infection) 12/07/2016  . Ventral hernia without obstruction or gangrene   . Hyponatremia 09/07/2015  . Acute respiratory failure ([place]) 09/01/2015  . Atrial fibrillation ([place]) 04/10/2015  . Essential hypertension 04/10/2015  . Diabetes ([place]) 04/10/2015  . Acute on chronic combined  systolic and diastolic CHF (congestive heart failure) ([place]) 03/21/2015  . Pulmonary nodule 11/10/2014    Past Surgical History:  Procedure Laterality Date  . ABDOMINAL HYSTERECTOMY    . BACK SURGERY     x3  . CORONARY STENT PLACEMENT     unknown location per pt  . FOOT SURGERY    . LEG SURGERY    . PACEMAKER PLACEMENT    . TONSILLECTOMY      Prior to Admission medications   Medication Sig Start Date End Date Taking? Authorizing Provider  acetaminophen (TYLENOL) 650 MG CR tablet Take 650 mg by mouth every 8 (eight) hours.    [provider]  aspirin EC 81 MG tablet Take 81 mg by mouth daily.    [provider]  baclofen (LIORESAL) 10 MG tablet Take 5 mg by mouth 3 (three) times daily as needed for muscle spasms.     [provider]  buPROPion (WELLBUTRIN XL) 300 MG 24 hr tablet Take 300 mg by mouth daily.  11/28/17   [provider]  busPIRone (BUSPAR) 5 MG tablet Take 5 mg by mouth 2 (two) times daily.    [provider]  carvedilol (COREG) 6.25 MG tablet Take 6.25 mg by mouth 2 (two) times daily with a meal.     [provider]  cholecalciferol (VITAMIN D) 1000 units tablet Take 2,000 Units by mouth daily.     [provider]  collagenase (SANTYL) ointment Apply topically daily. 11/17/18  [name], MD  docusate sodium (COLACE) 100 MG capsule Take 100 mg by mouth 2 (two) times daily.     [provider]  Ensure Max Protein (ENSURE MAX PROTEIN) LIQD Take 330 mLs (11 oz total) by mouth 2 (two) times daily between meals. 11/17/18   [name], MD  escitalopram (LEXAPRO) 10 MG tablet Take 10 mg by mouth daily.    [provider]  ferrous sulfate 325 (65 FE) MG tablet Take 325 mg by mouth daily with breakfast.    [provider]  fexofenadine (ALLEGRA) 60 MG tablet Take 60 mg by mouth 2 (two) times daily.    [provider]  furosemide (LASIX) 80 MG tablet Take 80 mg by mouth 2 (two) times  daily.    [provider]  gabapentin (NEURONTIN) 100 MG capsule Take 1 capsule (100 mg total) by mouth at bedtime. 11/17/18   [name], MD  insulin glargine (LANTUS) 100 UNIT/ML injection Inject 0.2 mLs (20 Units total) into the skin at bedtime. Patient taking differently: Inject 10 Units into the skin at bedtime.  09/07/18   [name], MD  ipratropium-albuterol (DUONEB) 0.5-2.5 (3) MG/3ML SOLN Take 3 mLs by nebulization every 6 (six) hours as needed (wheezing). Patient taking differently: Take 3 mLs by nebulization every 6 (six) hours as needed (shortness of breath).  07/09/18   Gladstone Lighter, MD  loperamide (IMODIUM) 2 MG capsule Take 2-4 mg by mouth See admin instructions. Take 2 capsules (4mg ) by mouth after initial loose stool and take 1 capsule (2mg ) by mouth after second loose stool    [provider]  magnesium oxide (MAG-OX) 400 MG tablet Take 400 mg by mouth daily.    [provider]  Melatonin 3 MG TABS Take 3 mg by mouth at bedtime.     [provider]  metFORMIN (GLUCOPHAGE-XR) 750 MG 24 hr tablet Take 750 mg by mouth daily.    [provider]  oxyCODONE (OXY IR/ROXICODONE) 5 MG immediate release tablet Take 1 tablet (5 mg total) by mouth every 6 (six) hours as needed for moderate pain or severe pain. Patient taking differently: Take 5 mg by mouth every 4 (four) hours as needed for moderate pain or severe pain.  09/07/18   [name], MD  pantoprazole (PROTONIX) 40 MG tablet Take 40 mg by mouth daily.    [provider]  polyethylene glycol (MIRALAX / GLYCOLAX) packet Take 17 g by mouth daily as needed for mild constipation or moderate constipation.     [provider]  PROAIR HFA 108 [phone] Base) MCG/ACT inhaler Inhale 2 puffs into the lungs every 6 (six) hours as needed for wheezing or shortness of breath.  10/02/17   [provider]  Rivaroxaban (XARELTO) 15 MG TABS tablet Take 15 mg by mouth daily.     [provider]  rOPINIRole (REQUIP) 0.5 MG tablet Take 0.5 mg by mouth at bedtime.     [provider]  senna (SENOKOT) 8.6 MG TABS tablet Take 1 tablet (8.6 mg total) by mouth daily. Patient taking differently: Take 1 tablet by mouth 2 (two) times a day.  07/10/18   Gladstone Lighter, MD  traZODone (DESYREL) 50 MG tablet Take 50 mg by mouth at bedtime.     [provider]  vitamin B-12 (CYANOCOBALAMIN) 1000 MCG tablet Take 1,000 mcg by mouth daily.    [provider]  vitamin C (ASCORBIC ACID) 500 MG tablet Take 500 mg by mouth  daily.    [provider]  Vitamin D, Ergocalciferol, (DRISDOL) 1.25 MG (50000 UT) CAPS capsule Take 1 capsule (50,000 Units total) by mouth every Thursday. 11/26/18   [name], MD  Vitamin D, Ergocalciferol, (DRISDOL) 50000 UNITS CAPS capsule Take 50,000 Units by mouth every Thursday.     [provider]    Allergies Ceftriaxone; Cefuroxime; Cephalosporins; Codeine; Epinephrine; Morphine and related; Buprenorphine hcl; Ciprofloxacin; Latex; Lidocaine; and Procaine  Family History  Problem Relation Age of Onset  . CVA Mother        Congestive Heart Failure, heart attack, hypertension, stroke  . Diabetes Mellitus II Sister   . Heart disease Father        Congestive Heart Failure, heart attack, hypertension    Social History Social History   Tobacco Use  . Smoking status: Never Smoker  . Smokeless tobacco: Never Used  Substance Use Topics  . Alcohol use: No    Alcohol/week: 0.0 standard drinks  . Drug use: No    Review of Systems Constitutional: No fever/chills Eyes: No visual changes. ENT: No sore throat. No stiff neck no neck pain Cardiovascular: Denies chest pain. Respiratory: Denies shortness of breath. Gastrointestinal:   no vomiting.  No diarrhea.  No constipation. Genitourinary: Negative for dysuria. Musculoskeletal: Negative lower extremity swelling Skin: Negative for  rash. Neurological: Negative for severe headaches, focal weakness or numbness.   ____________________________________________   PHYSICAL EXAM:  VITAL SIGNS: ED Triage Vitals  Enc Vitals Group     BP 11/28/18 0709 127/61     Pulse Rate 11/28/18 0709 74     Resp 11/28/18 0709 18     Temp 11/28/18 0709 97.6 F (36.4 C)     Temp Source 11/28/18 0709 Oral     SpO2 --      Weight 11/28/18 0715 168 lb (76.2 kg)     Height 11/28/18 0715 5\' 7"  (1.702 m)     Head Circumference --      Peak Flow --      Pain Score 11/28/18 0710 7     Pain Loc --      Pain Edu? --      Excl. in [place]? --     Constitutional: Alert and oriented. Well appearing and in no acute distress. Eyes: Conjunctivae are normal Head: Atraumatic Neck:   Nontender with no meningismus, no masses, no stridor Cardiovascular: Normal rate, regular rhythm. Grossly normal heart sounds.  Good peripheral circulation. Respiratory: Normal respiratory effort.  No retractions. Lungs CTAB. Abdominal: Soft and nontender. No distention. No guarding no rebound Back:  There is no focal tenderness or step off.  there is no midline tenderness there are no lesions noted. there is no CVA tenderness Musculoskeletal: There is scant amount of tenderness to the left knee but she can fully range it and she has no evidence of discomfort with that.  Both hips are nontender with full range of motion, both ankles are nontender with full range of motion no upper extremity tenderness. No joint effusions, no DVT signs strong distal pulses symmetric chronic appearing bilateral edema Neurologic:  Normal speech and language. No gross focal neurologic deficits are appreciated.  Skin: Small skin tear to the pretibial region with no bony tenderness. Psychiatric: Mood and affect are normal. Speech and behavior are normal.  ____________________________________________   LABS (all labs ordered are listed, but only abnormal results are displayed)  Labs Reviewed  - No data to display  Pertinent labs  results that were  available during my care of the patient were reviewed by me and considered in my medical decision making (see chart for details). ____________________________________________  EKG  I personally interpreted any EKGs ordered by me or triage  ____________________________________________  RADIOLOGY  Pertinent labs & imaging results that were available during my care of the patient were reviewed by me and considered in my medical decision making (see chart for details). If possible, patient and/or family made aware of any abnormal findings.  Dg Knee Complete 4 Views Right  Result Date: 11/28/2018 CLINICAL DATA:  Patient status post fall. Swelling of the right knee anteriorly. EXAM: RIGHT KNEE - COMPLETE 4+ VIEW COMPARISON:  None. FINDINGS: Normal anatomic alignment. No evidence for acute fracture or dislocation. Tricompartmental osteophytosis most pronounced within the medial compartment with joint space narrowing and subchondral sclerosis. Regional soft tissues unremarkable. Vascular calcifications. IMPRESSION: No acute osseous abnormality.  Degenerative changes. Electronically Signed   By: Lovey Newcomer M.D.   On: 11/28/2018 07:54   ____________________________________________    PROCEDURES  Procedure(s) performed: None  Procedures  Critical Care performed: None  ____________________________________________   INITIAL IMPRESSION / ASSESSMENT AND PLAN / ED COURSE  Pertinent labs & imaging results that were available during my care of the patient were reviewed by me and considered in my medical decision making (see chart for details).  Patient here with a small skin tear to her leg gets approximately 3 x 4 cm V shaped.  We are going to provide wound care.  I cannot sew this.  No other injury and no reported fall no head trauma, she had a scant amount of discomfort which is not really elicitable to her right knee, we did do an x-ray  which is negative.   ____________________________________________   FINAL CLINICAL IMPRESSION(S) / ED DIAGNOSES  Final diagnoses:  None      This chart was dictated using voice recognition software.  Despite best efforts to proofread,  errors can occur which can change meaning.      Schuyler Amor, MD 11/28/18 [phone]

## 2018-11-28 NOTE — ED Triage Notes (Addendum)
Patient arrives from [facility] post fall from wheelchair. bandage over Lower R leg with no bleed through noted. States pain lower leg. Hx of blood thinners.

## 2018-11-28 NOTE — ED Notes (Signed)
[facility] to notify of results and plan to return to nursing home. States patient to be transported ambulance.

## 2018-11-28 NOTE — ED Notes (Signed)
Daughter [name] contacted on cell phone. Notified of results and plan to return to nursing home by ambulance.

## 2018-11-28 NOTE — ED Notes (Signed)
Abrasions and skin tears to lower R leg cleansed with NS, bacitracin ung and non adhering dressing applied.

## 2018-12-23 DEATH — deceased

## 2019-05-26 IMAGING — CT CT RENAL STONE PROTOCOL
2 of 5 series · 15 of 46 positions shown, 17 images · non-contrast
Comparison: Radiograph 12/11/2017, CT chest 06/01/2018, CT abdomen
pelvis 11/19/2009, CT chest 10/06/2014

CLINICAL DATA: Flank pain

EXAM:
CT ABDOMEN AND PELVIS WITHOUT CONTRAST
TECHNIQUE: Multidetector CT imaging of the abdomen and pelvis was performed
following the standard protocol without IV contrast.

[Series 2: stone full standard · axial · 0.80mm/px · z∈[-583,-218]mm · 12 of 87 slices shown, 14 images]
[im 7/87  soft-tissue]
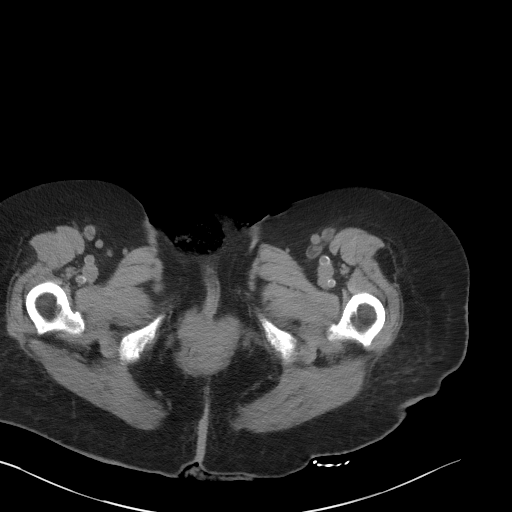
[im 7/87  bone]
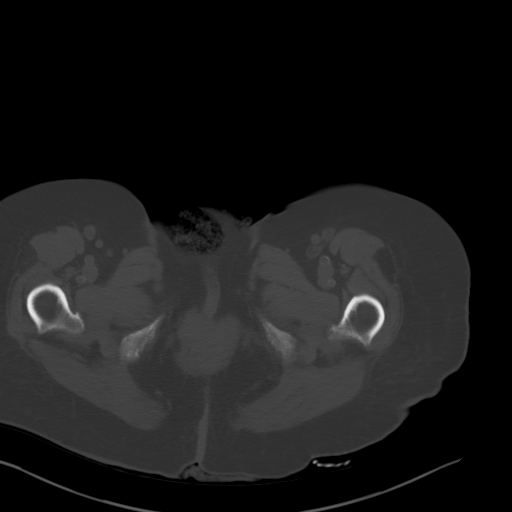
[im 14/87  soft-tissue]
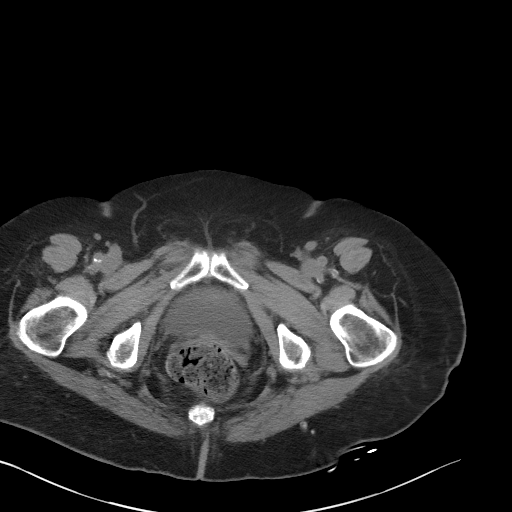
[im 20/87  soft-tissue]
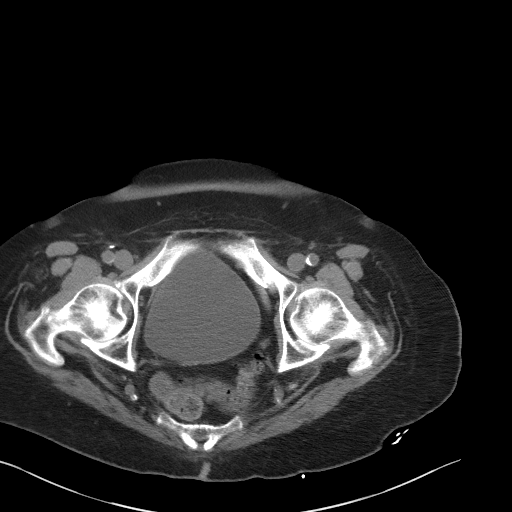
[im 27/87  soft-tissue]
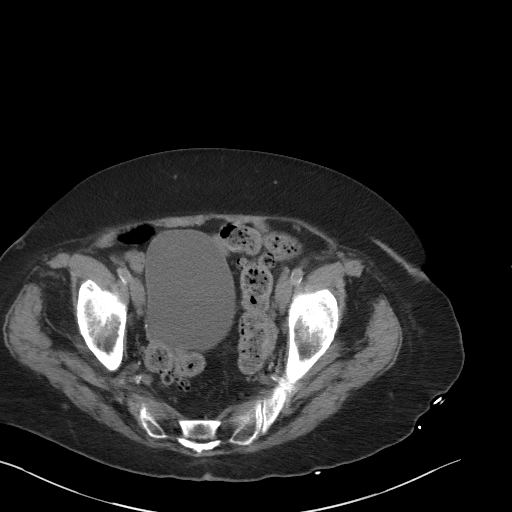
[im 34/87  soft-tissue]
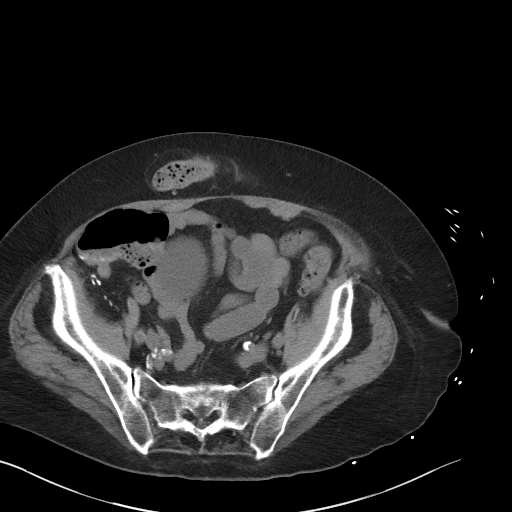
[im 40/87  soft-tissue]
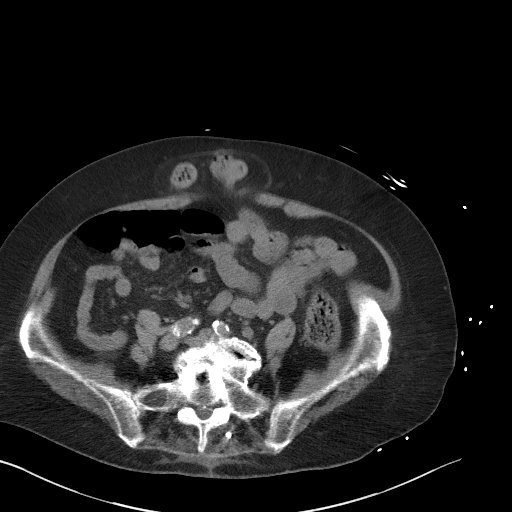
[im 47/87  soft-tissue]
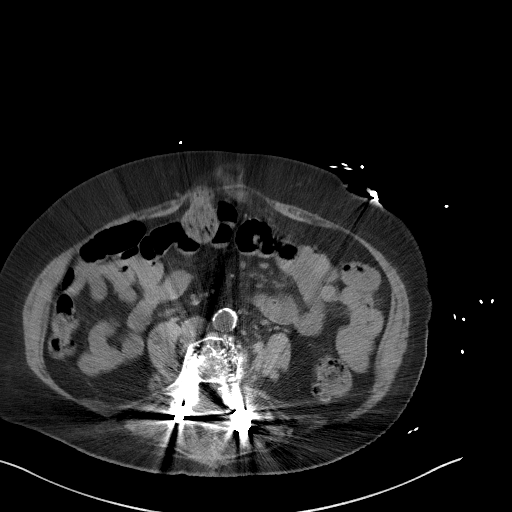
[im 53/87  soft-tissue]
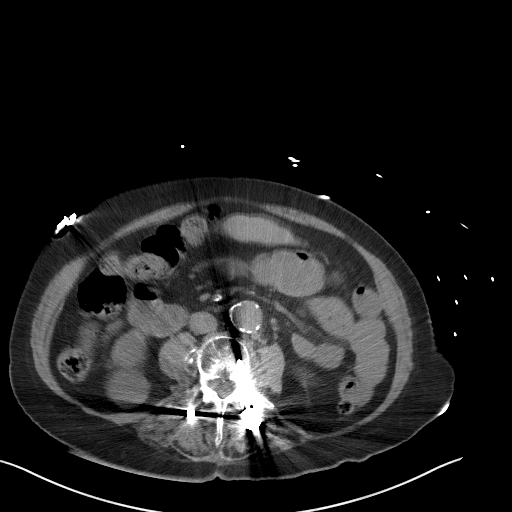
[im 60/87  soft-tissue]
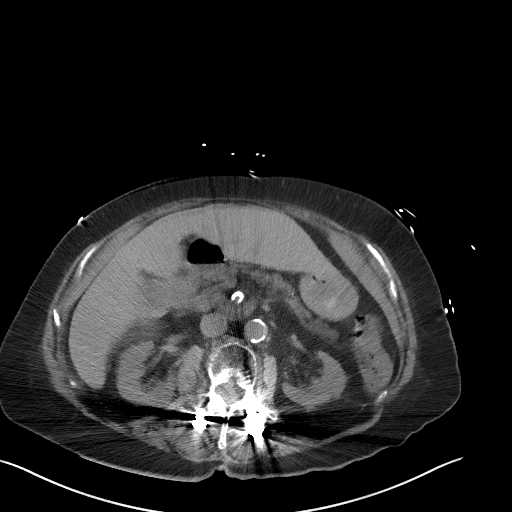
[im 60/87  bone]
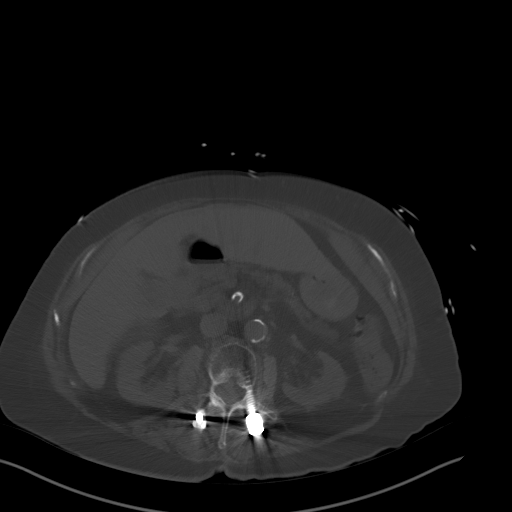
[im 67/87  soft-tissue]
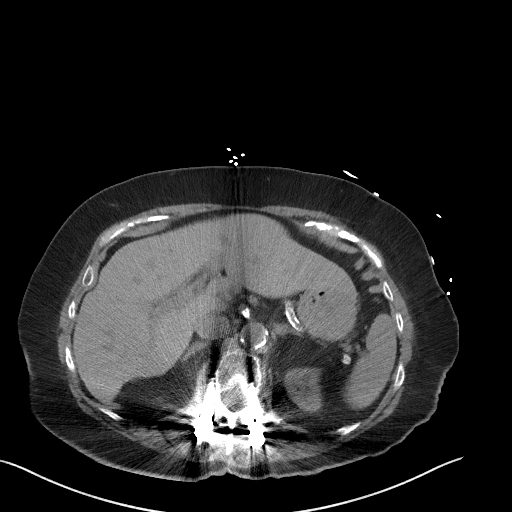
[im 73/87  soft-tissue]
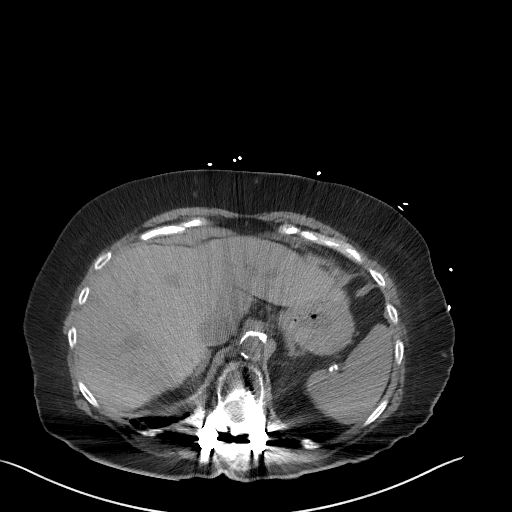
[im 80/87  soft-tissue]
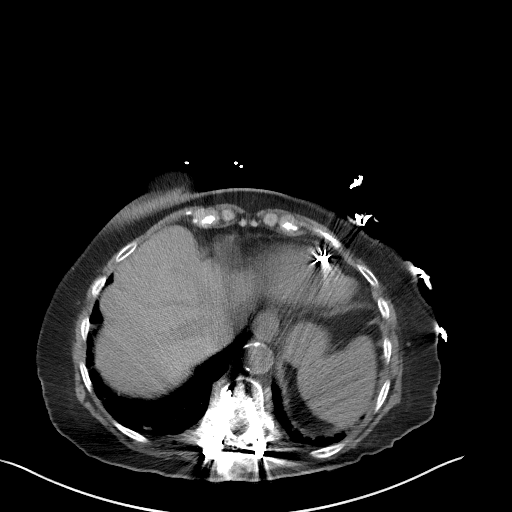

[Series 5: coronal (person_name) · coronal · 0.72mm/px · 3 of 138 slices shown]
[im 46/138  soft-tissue]
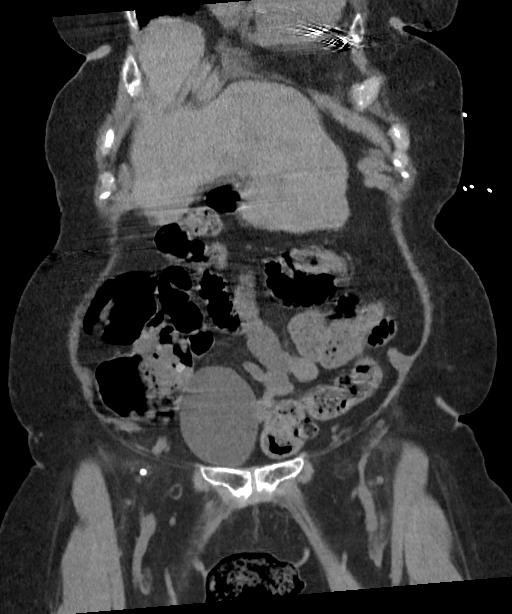
[im 61/138  soft-tissue]
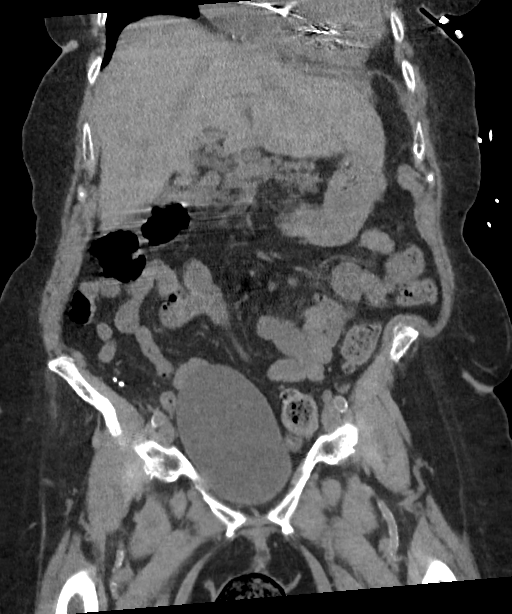
[im 77/138  soft-tissue]
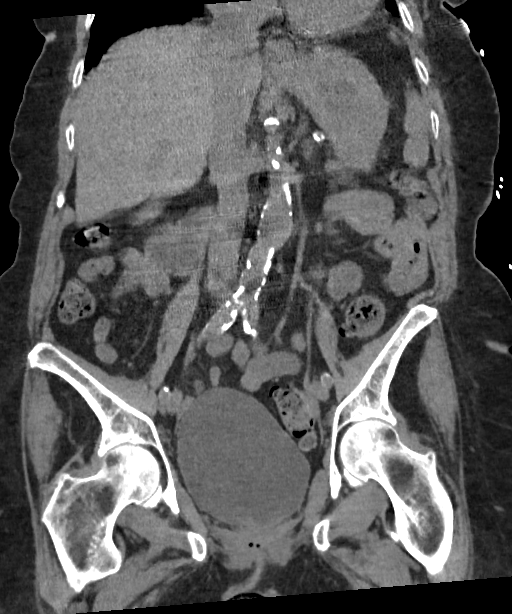

[15 of 46 positions shown; findings below may reference images not displayed]

FINDINGS: Lower chest: Stable bilateral lung base nodules. Patchy foci of
consolidation at the right lung base with surrounding ground-glass
density suspicious for infectious pneumonia. No pleural effusion.
Borderline cardiomegaly. Partially visualized cardiac pacing leads.

Hepatobiliary: No focal liver abnormality is seen. No gallstones,
gallbladder wall thickening, or biliary dilatation.

Pancreas: Unremarkable. No pancreatic ductal dilatation or
surrounding inflammatory changes.

Spleen: Normal in size without focal abnormality.

Adrenals/Urinary Tract: Adrenal glands are normal. No
hydronephrosis. Probable cysts within the bilateral kidneys. The
bladder is unremarkable

Stomach/Bowel: Stomach nonenlarged. No dilated small bowel. No bowel
wall thickening. Postsurgical changes at the cecum.

Vascular/Lymphatic: Heavily calcified aorta. No aneurysm. No
significantly enlarged lymph nodes.

Reproductive: Status post hysterectomy. No adnexal masses.

Other: Negative for free air or free fluid. Infraumbilical ventral
hernia containing mesenteric fat and a portion of the transverse
colon.

Musculoskeletal: Posterior rods and fixating screws from T11 through
S1. Chronic compression deformity at T10 when counting from above.
IMPRESSION: 1. No CT evidence for acute intra-abdominal or pelvic abnormality.
2. Patchy foci of consolidation at the right base with surrounding
ground-glass density suspicious for pneumonia
3. Extensive postsurgical changes of the thoracolumbar spine
4. Infraumbilical ventral hernia containing fat and part of the
transverse colon. Negative for incarceration or obstruction.
